# Patient Record
Sex: Female | Born: 1955
Health system: Southern US, Community
[De-identification: ages and names within clinical notes are randomized; demographics above are authoritative.]

## PROBLEM LIST (undated history)

## (undated) DIAGNOSIS — J449 Chronic obstructive pulmonary disease, unspecified: Secondary | ICD-10-CM

## (undated) DIAGNOSIS — E785 Hyperlipidemia, unspecified: Secondary | ICD-10-CM

## (undated) DIAGNOSIS — K589 Irritable bowel syndrome without diarrhea: Secondary | ICD-10-CM

## (undated) DIAGNOSIS — D509 Iron deficiency anemia, unspecified: Secondary | ICD-10-CM

## (undated) DIAGNOSIS — K219 Gastro-esophageal reflux disease without esophagitis: Secondary | ICD-10-CM

## (undated) DIAGNOSIS — I2510 Atherosclerotic heart disease of native coronary artery without angina pectoris: Secondary | ICD-10-CM

## (undated) DIAGNOSIS — E039 Hypothyroidism, unspecified: Secondary | ICD-10-CM

## (undated) DIAGNOSIS — Z9889 Other specified postprocedural states: Secondary | ICD-10-CM

## (undated) DIAGNOSIS — K449 Diaphragmatic hernia without obstruction or gangrene: Secondary | ICD-10-CM

## (undated) DIAGNOSIS — I2102 ST elevation (STEMI) myocardial infarction involving left anterior descending coronary artery: Secondary | ICD-10-CM

## (undated) DIAGNOSIS — R0600 Dyspnea, unspecified: Secondary | ICD-10-CM

## (undated) DIAGNOSIS — R112 Nausea with vomiting, unspecified: Secondary | ICD-10-CM

## (undated) DIAGNOSIS — R519 Headache, unspecified: Secondary | ICD-10-CM

## (undated) DIAGNOSIS — I10 Essential (primary) hypertension: Secondary | ICD-10-CM

## (undated) DIAGNOSIS — Z951 Presence of aortocoronary bypass graft: Secondary | ICD-10-CM

## (undated) DIAGNOSIS — I480 Paroxysmal atrial fibrillation: Secondary | ICD-10-CM

## (undated) DIAGNOSIS — J189 Pneumonia, unspecified organism: Secondary | ICD-10-CM

## (undated) DIAGNOSIS — Z720 Tobacco use: Secondary | ICD-10-CM

## (undated) HISTORY — PX: CHOLECYSTECTOMY: SHX55

## (undated) HISTORY — DX: Hyperlipidemia, unspecified: E78.5

## (undated) HISTORY — PX: OVARIAN CYST REMOVAL: SHX89

## (undated) HISTORY — DX: Chronic obstructive pulmonary disease, unspecified: J44.9

## (undated) HISTORY — PX: APPENDECTOMY: SHX54

## (undated) HISTORY — DX: Presence of aortocoronary bypass graft: Z95.1

## (undated) HISTORY — DX: ST elevation (STEMI) myocardial infarction involving left anterior descending coronary artery: I21.02

## (undated) HISTORY — PX: NM MYOCAR PERF WALL MOTION: HXRAD629

## (undated) HISTORY — DX: Paroxysmal atrial fibrillation: I48.0

## (undated) HISTORY — PX: BREAST BIOPSY: SHX20

## (undated) HISTORY — DX: Gastro-esophageal reflux disease without esophagitis: K21.9

## (undated) HISTORY — PX: BREAST EXCISIONAL BIOPSY: SUR124

## (undated) HISTORY — DX: Atherosclerotic heart disease of native coronary artery without angina pectoris: I25.10

## (undated) HISTORY — DX: Diaphragmatic hernia without obstruction or gangrene: K44.9

## (undated) HISTORY — PX: TRANSTHORACIC ECHOCARDIOGRAM: SHX275

## (undated) HISTORY — DX: Tobacco use: Z72.0

## (undated) HISTORY — DX: Hypothyroidism, unspecified: E03.9

## (undated) HISTORY — DX: Iron deficiency anemia, unspecified: D50.9

---

## 1996-01-04 DIAGNOSIS — I2510 Atherosclerotic heart disease of native coronary artery without angina pectoris: Secondary | ICD-10-CM

## 1996-01-04 HISTORY — DX: Atherosclerotic heart disease of native coronary artery without angina pectoris: I25.10

## 1996-01-19 DIAGNOSIS — I2102 ST elevation (STEMI) myocardial infarction involving left anterior descending coronary artery: Secondary | ICD-10-CM

## 1996-01-19 HISTORY — PX: CORONARY ANGIOPLASTY: SHX604

## 1996-01-19 HISTORY — DX: ST elevation (STEMI) myocardial infarction involving left anterior descending coronary artery: I21.02

## 1996-01-19 HISTORY — PX: LEFT HEART CATH AND CORONARY ANGIOGRAPHY: CATH118249

## 1996-02-26 HISTORY — PX: CORONARY ANGIOPLASTY WITH STENT PLACEMENT: SHX49

## 1996-07-03 DIAGNOSIS — Z951 Presence of aortocoronary bypass graft: Secondary | ICD-10-CM | POA: Insufficient documentation

## 1996-07-03 HISTORY — DX: Presence of aortocoronary bypass graft: Z95.1

## 1996-07-30 HISTORY — PX: LEFT HEART CATH AND CORONARY ANGIOGRAPHY: CATH118249

## 1996-07-30 HISTORY — PX: CARDIAC CATHETERIZATION: SHX172

## 1996-07-31 HISTORY — PX: CORONARY ARTERY BYPASS GRAFT: SHX141

## 1996-11-14 DIAGNOSIS — I25119 Atherosclerotic heart disease of native coronary artery with unspecified angina pectoris: Secondary | ICD-10-CM | POA: Insufficient documentation

## 1996-11-14 DIAGNOSIS — I2510 Atherosclerotic heart disease of native coronary artery without angina pectoris: Secondary | ICD-10-CM | POA: Insufficient documentation

## 1997-06-24 ENCOUNTER — Other Ambulatory Visit: Admission: RE | Admit: 1997-06-24 | Discharge: 1997-06-24 | Payer: Self-pay | Admitting: *Deleted

## 1998-03-17 ENCOUNTER — Emergency Department (HOSPITAL_COMMUNITY): Admission: EM | Admit: 1998-03-17 | Discharge: 1998-03-17 | Payer: Self-pay | Admitting: Emergency Medicine

## 1998-03-17 ENCOUNTER — Encounter: Payer: Self-pay | Admitting: Emergency Medicine

## 1998-03-25 ENCOUNTER — Ambulatory Visit (HOSPITAL_COMMUNITY): Admission: RE | Admit: 1998-03-25 | Discharge: 1998-03-26 | Payer: Self-pay

## 1998-08-21 ENCOUNTER — Other Ambulatory Visit: Admission: RE | Admit: 1998-08-21 | Discharge: 1998-08-21 | Payer: Self-pay | Admitting: *Deleted

## 2000-02-17 ENCOUNTER — Other Ambulatory Visit: Admission: RE | Admit: 2000-02-17 | Discharge: 2000-02-17 | Payer: Self-pay | Admitting: *Deleted

## 2000-07-10 ENCOUNTER — Encounter (INDEPENDENT_AMBULATORY_CARE_PROVIDER_SITE_OTHER): Payer: Self-pay | Admitting: *Deleted

## 2000-07-10 ENCOUNTER — Ambulatory Visit (HOSPITAL_COMMUNITY): Admission: RE | Admit: 2000-07-10 | Discharge: 2000-07-10 | Payer: Self-pay | Admitting: *Deleted

## 2001-05-07 ENCOUNTER — Other Ambulatory Visit: Admission: RE | Admit: 2001-05-07 | Discharge: 2001-05-07 | Payer: Self-pay | Admitting: *Deleted

## 2002-06-13 ENCOUNTER — Other Ambulatory Visit: Admission: RE | Admit: 2002-06-13 | Discharge: 2002-06-13 | Payer: Self-pay | Admitting: *Deleted

## 2003-01-31 ENCOUNTER — Ambulatory Visit (HOSPITAL_COMMUNITY): Admission: RE | Admit: 2003-01-31 | Discharge: 2003-01-31 | Payer: Self-pay | Admitting: Cardiovascular Disease

## 2003-01-31 HISTORY — PX: LEFT HEART CATH AND CORS/GRAFTS ANGIOGRAPHY: CATH118250

## 2003-01-31 HISTORY — PX: CARDIAC CATHETERIZATION: SHX172

## 2004-05-19 ENCOUNTER — Encounter: Admission: RE | Admit: 2004-05-19 | Discharge: 2004-05-19 | Payer: Self-pay | Admitting: Gastroenterology

## 2004-10-14 ENCOUNTER — Ambulatory Visit (HOSPITAL_COMMUNITY): Admission: RE | Admit: 2004-10-14 | Discharge: 2004-10-14 | Payer: Self-pay | Admitting: Gastroenterology

## 2006-06-16 ENCOUNTER — Encounter: Admission: RE | Admit: 2006-06-16 | Discharge: 2006-06-16 | Payer: Self-pay | Admitting: Orthopedic Surgery

## 2006-10-23 ENCOUNTER — Ambulatory Visit (HOSPITAL_COMMUNITY): Admission: RE | Admit: 2006-10-23 | Discharge: 2006-10-23 | Payer: Self-pay | Admitting: Orthopedic Surgery

## 2006-10-23 ENCOUNTER — Encounter (INDEPENDENT_AMBULATORY_CARE_PROVIDER_SITE_OTHER): Payer: Self-pay | Admitting: Orthopedic Surgery

## 2008-05-09 ENCOUNTER — Ambulatory Visit: Payer: Self-pay | Admitting: Internal Medicine

## 2008-05-19 LAB — CBC & DIFF AND RETIC
BASO%: 0.5 % (ref 0.0–2.0)
Basophils Absolute: 0 10*3/uL (ref 0.0–0.1)
EOS%: 4.5 % (ref 0.0–7.0)
Eosinophils Absolute: 0.3 10*3/uL (ref 0.0–0.5)
HCT: 33.8 % — ABNORMAL LOW (ref 34.8–46.6)
HGB: 11.5 g/dL — ABNORMAL LOW (ref 11.6–15.9)
IRF: 0.27 (ref 0.130–0.330)
LYMPH%: 33.3 % (ref 14.0–49.7)
MCH: 32.9 pg (ref 25.1–34.0)
MCHC: 34.1 g/dL (ref 31.5–36.0)
MCV: 96.5 fL (ref 79.5–101.0)
MONO#: 0.5 10*3/uL (ref 0.1–0.9)
MONO%: 6.7 % (ref 0.0–14.0)
NEUT#: 3.9 10*3/uL (ref 1.5–6.5)
NEUT%: 55 % (ref 38.4–76.8)
Platelets: 255 10*3/uL (ref 145–400)
RBC: 3.5 10*6/uL — ABNORMAL LOW (ref 3.70–5.45)
RDW: 14 % (ref 11.2–14.5)
RETIC #: 32.2 10*3/uL (ref 19.7–115.1)
Retic %: 0.9 % (ref 0.4–2.3)
WBC: 7.1 10*3/uL (ref 3.9–10.3)
lymph#: 2.4 10*3/uL (ref 0.9–3.3)

## 2008-05-21 LAB — IRON AND TIBC
%SAT: 26 % (ref 20–55)
Iron: 87 ug/dL (ref 42–145)
TIBC: 329 ug/dL (ref 250–470)
UIBC: 242 ug/dL

## 2008-05-21 LAB — PROTEIN ELECTROPHORESIS, SERUM
Albumin ELP: 59.1 % (ref 55.8–66.1)
Alpha-1-Globulin: 4.8 % (ref 2.9–4.9)
Alpha-2-Globulin: 13.8 % — ABNORMAL HIGH (ref 7.1–11.8)
Beta 2: 4.7 % (ref 3.2–6.5)
Beta Globulin: 6 % (ref 4.7–7.2)
Gamma Globulin: 11.6 % (ref 11.1–18.8)
Total Protein, Serum Electrophoresis: 6.8 g/dL (ref 6.0–8.3)

## 2008-05-21 LAB — COMPREHENSIVE METABOLIC PANEL
ALT: 12 U/L (ref 0–35)
AST: 14 U/L (ref 0–37)
Albumin: 4.1 g/dL (ref 3.5–5.2)
Alkaline Phosphatase: 67 U/L (ref 39–117)
BUN: 20 mg/dL (ref 6–23)
CO2: 18 mEq/L — ABNORMAL LOW (ref 19–32)
Calcium: 9.3 mg/dL (ref 8.4–10.5)
Chloride: 110 mEq/L (ref 96–112)
Creatinine, Ser: 1.43 mg/dL — ABNORMAL HIGH (ref 0.40–1.20)
Glucose, Bld: 72 mg/dL (ref 70–99)
Potassium: 4.8 mEq/L (ref 3.5–5.3)
Sodium: 136 mEq/L (ref 135–145)
Total Bilirubin: 0.3 mg/dL (ref 0.3–1.2)
Total Protein: 6.8 g/dL (ref 6.0–8.3)

## 2008-05-21 LAB — FOLATE: Folate: 6.4 ng/mL

## 2008-05-21 LAB — FERRITIN: Ferritin: 55 ng/mL (ref 10–291)

## 2008-05-21 LAB — LACTATE DEHYDROGENASE: LDH: 148 U/L (ref 94–250)

## 2008-05-21 LAB — VITAMIN B12: Vitamin B-12: 288 pg/mL (ref 211–911)

## 2008-05-27 LAB — CBC WITH DIFFERENTIAL/PLATELET
BASO%: 0.7 % (ref 0.0–2.0)
Basophils Absolute: 0.1 10*3/uL (ref 0.0–0.1)
EOS%: 3.8 % (ref 0.0–7.0)
Eosinophils Absolute: 0.3 10*3/uL (ref 0.0–0.5)
HCT: 32.3 % — ABNORMAL LOW (ref 34.8–46.6)
HGB: 11 g/dL — ABNORMAL LOW (ref 11.6–15.9)
LYMPH%: 32 % (ref 14.0–49.7)
MCH: 33.3 pg (ref 25.1–34.0)
MCHC: 34 g/dL (ref 31.5–36.0)
MCV: 97.8 fL (ref 79.5–101.0)
MONO#: 0.4 10*3/uL (ref 0.1–0.9)
MONO%: 5.1 % (ref 0.0–14.0)
NEUT#: 4.5 10*3/uL (ref 1.5–6.5)
NEUT%: 58.4 % (ref 38.4–76.8)
Platelets: 263 10*3/uL (ref 145–400)
RBC: 3.31 10*6/uL — ABNORMAL LOW (ref 3.70–5.45)
RDW: 13.7 % (ref 11.2–14.5)
WBC: 7.7 10*3/uL (ref 3.9–10.3)
lymph#: 2.5 10*3/uL (ref 0.9–3.3)

## 2008-05-27 LAB — LACTATE DEHYDROGENASE: LDH: 123 U/L (ref 94–250)

## 2008-07-15 ENCOUNTER — Encounter: Admission: RE | Admit: 2008-07-15 | Discharge: 2008-07-15 | Payer: Self-pay | Admitting: Endocrinology

## 2008-08-25 ENCOUNTER — Ambulatory Visit: Payer: Self-pay | Admitting: Internal Medicine

## 2009-01-13 ENCOUNTER — Ambulatory Visit: Payer: Self-pay | Admitting: Internal Medicine

## 2009-01-15 LAB — CBC WITH DIFFERENTIAL/PLATELET
BASO%: 0.7 % (ref 0.0–2.0)
Basophils Absolute: 0.1 10*3/uL (ref 0.0–0.1)
EOS%: 3.5 % (ref 0.0–7.0)
Eosinophils Absolute: 0.3 10*3/uL (ref 0.0–0.5)
HCT: 33.6 % — ABNORMAL LOW (ref 34.8–46.6)
HGB: 11.4 g/dL — ABNORMAL LOW (ref 11.6–15.9)
LYMPH%: 29.6 % (ref 14.0–49.7)
MCH: 33.8 pg (ref 25.1–34.0)
MCHC: 34.1 g/dL (ref 31.5–36.0)
MCV: 99.2 fL (ref 79.5–101.0)
MONO#: 0.7 10*3/uL (ref 0.1–0.9)
MONO%: 7.8 % (ref 0.0–14.0)
NEUT#: 5.1 10*3/uL (ref 1.5–6.5)
NEUT%: 58.4 % (ref 38.4–76.8)
Platelets: 254 10*3/uL (ref 145–400)
RBC: 3.38 10*6/uL — ABNORMAL LOW (ref 3.70–5.45)
RDW: 13.2 % (ref 11.2–14.5)
WBC: 8.7 10*3/uL (ref 3.9–10.3)
lymph#: 2.6 10*3/uL (ref 0.9–3.3)

## 2009-01-15 LAB — COMPREHENSIVE METABOLIC PANEL
ALT: 20 U/L (ref 0–35)
AST: 20 U/L (ref 0–37)
Albumin: 4.4 g/dL (ref 3.5–5.2)
Alkaline Phosphatase: 56 U/L (ref 39–117)
BUN: 17 mg/dL (ref 6–23)
CO2: 22 mEq/L (ref 19–32)
Calcium: 9.8 mg/dL (ref 8.4–10.5)
Chloride: 106 mEq/L (ref 96–112)
Creatinine, Ser: 1.25 mg/dL — ABNORMAL HIGH (ref 0.40–1.20)
Glucose, Bld: 96 mg/dL (ref 70–99)
Potassium: 5 mEq/L (ref 3.5–5.3)
Sodium: 139 mEq/L (ref 135–145)
Total Bilirubin: 0.4 mg/dL (ref 0.3–1.2)
Total Protein: 6.8 g/dL (ref 6.0–8.3)

## 2009-01-15 LAB — LACTATE DEHYDROGENASE: LDH: 141 U/L (ref 94–250)

## 2009-04-10 ENCOUNTER — Ambulatory Visit: Payer: Self-pay | Admitting: Internal Medicine

## 2009-06-22 ENCOUNTER — Ambulatory Visit: Payer: Self-pay | Admitting: Internal Medicine

## 2009-06-24 LAB — CBC WITH DIFFERENTIAL/PLATELET
BASO%: 0.7 % (ref 0.0–2.0)
Basophils Absolute: 0.1 10*3/uL (ref 0.0–0.1)
EOS%: 3.7 % (ref 0.0–7.0)
Eosinophils Absolute: 0.3 10*3/uL (ref 0.0–0.5)
HCT: 34.8 % (ref 34.8–46.6)
HGB: 12.1 g/dL (ref 11.6–15.9)
LYMPH%: 32.2 % (ref 14.0–49.7)
MCH: 33.3 pg (ref 25.1–34.0)
MCHC: 34.7 g/dL (ref 31.5–36.0)
MCV: 95.9 fL (ref 79.5–101.0)
MONO#: 0.4 10*3/uL (ref 0.1–0.9)
MONO%: 5.2 % (ref 0.0–14.0)
NEUT#: 4.8 10*3/uL (ref 1.5–6.5)
NEUT%: 58.2 % (ref 38.4–76.8)
Platelets: 221 10*3/uL (ref 145–400)
RBC: 3.63 10*6/uL — ABNORMAL LOW (ref 3.70–5.45)
RDW: 12.4 % (ref 11.2–14.5)
WBC: 8.2 10*3/uL (ref 3.9–10.3)
lymph#: 2.6 10*3/uL (ref 0.9–3.3)

## 2009-06-24 LAB — COMPREHENSIVE METABOLIC PANEL
ALT: 20 U/L (ref 0–35)
AST: 16 U/L (ref 0–37)
Albumin: 4.4 g/dL (ref 3.5–5.2)
Alkaline Phosphatase: 68 U/L (ref 39–117)
BUN: 24 mg/dL — ABNORMAL HIGH (ref 6–23)
CO2: 17 mEq/L — ABNORMAL LOW (ref 19–32)
Calcium: 9.7 mg/dL (ref 8.4–10.5)
Chloride: 107 mEq/L (ref 96–112)
Creatinine, Ser: 1.32 mg/dL — ABNORMAL HIGH (ref 0.40–1.20)
Glucose, Bld: 126 mg/dL — ABNORMAL HIGH (ref 70–99)
Potassium: 4.8 mEq/L (ref 3.5–5.3)
Sodium: 136 mEq/L (ref 135–145)
Total Bilirubin: 0.4 mg/dL (ref 0.3–1.2)
Total Protein: 6.9 g/dL (ref 6.0–8.3)

## 2009-06-24 LAB — LACTATE DEHYDROGENASE: LDH: 138 U/L (ref 94–250)

## 2009-09-09 ENCOUNTER — Encounter: Admission: RE | Admit: 2009-09-09 | Discharge: 2009-09-09 | Payer: Self-pay | Admitting: Endocrinology

## 2009-12-11 ENCOUNTER — Ambulatory Visit: Payer: Self-pay | Admitting: Internal Medicine

## 2010-01-22 ENCOUNTER — Ambulatory Visit: Payer: Self-pay | Admitting: Internal Medicine

## 2010-01-23 ENCOUNTER — Encounter: Payer: Self-pay | Admitting: Gastroenterology

## 2010-01-29 LAB — CBC WITH DIFFERENTIAL/PLATELET
BASO%: 0.6 % (ref 0.0–2.0)
Basophils Absolute: 0.1 10*3/uL (ref 0.0–0.1)
EOS%: 2.7 % (ref 0.0–7.0)
Eosinophils Absolute: 0.2 10*3/uL (ref 0.0–0.5)
HCT: 36.6 % (ref 34.8–46.6)
HGB: 12.6 g/dL (ref 11.6–15.9)
LYMPH%: 33.3 % (ref 14.0–49.7)
MCH: 33.4 pg (ref 25.1–34.0)
MCHC: 34.5 g/dL (ref 31.5–36.0)
MCV: 96.9 fL (ref 79.5–101.0)
MONO#: 0.6 10*3/uL (ref 0.1–0.9)
MONO%: 7.6 % (ref 0.0–14.0)
NEUT#: 4.6 10*3/uL (ref 1.5–6.5)
NEUT%: 55.8 % (ref 38.4–76.8)
Platelets: 249 10*3/uL (ref 145–400)
RBC: 3.78 10*6/uL (ref 3.70–5.45)
RDW: 12.8 % (ref 11.2–14.5)
WBC: 8.3 10*3/uL (ref 3.9–10.3)
lymph#: 2.8 10*3/uL (ref 0.9–3.3)

## 2010-01-29 LAB — IRON AND TIBC
%SAT: 24 % (ref 20–55)
Iron: 72 ug/dL (ref 42–145)
TIBC: 297 ug/dL (ref 250–470)
UIBC: 225 ug/dL

## 2010-01-29 LAB — FERRITIN: Ferritin: 170 ng/mL (ref 10–291)

## 2010-05-18 NOTE — Op Note (Signed)
NAMEPATRECIA, Dominique Barber            ACCOUNT NO.:  [id number]   MEDICAL RECORD NO.:  [id number]          PATIENT TYPE:  AMB   LOCATION:  DAY                          FACILITY:  [facility]   PHYSICIAN:  Madlyn Frankel. Charlann Boxer, M.D.  DATE OF BIRTH:  1955/04/12   DATE OF PROCEDURE:  10/23/2006  DATE OF DISCHARGE:                               OPERATIVE REPORT   PREOPERATIVE DIAGNOSIS:  Left elbow soft tissue mass.   POSTOPERATIVE DIAGNOSIS:  Left elbow soft tissue mass.   PROCEDURE:  Excision of left elbow soft tissue mass medially.   SPECIMENS:  Sent to frozen section.   SURGEON:  Madlyn Frankel. Charlann Boxer, M.D.   ASSISTANT:  Yetta Glassman. Mann, PA   ANESTHESIA:  General LMA.   FINDINGS:  There was a soft tissue mass that was soft and spongy.  It  was nonpulsatile.  It did not have the appearance of any adipose tissue.  It had a bluish hue.   TOURNIQUET TIME:  Less than 20 minutes at 200 mmHg.   DRAINS:  None.   COMPLICATIONS:  None.   INDICATIONS FOR PROCEDURE:  The patient is a 55 year old right-hand  dominant female here for evaluation of left elbow mass.  It was  beginning to be irritating to her.  She had no constitutional symptoms  of fevers, chills, or night sweats.  The mass was noted to be mobile,  soft, and it was nonpulsatile on examination.  MRI was ordered to  confirm that it was not a neurologically originated tumor.  Given the  persistence of her symptoms, she wished to have it removed, despite the  fact that it was felt to be a benign mass based on examination.  Consent  was obtained after reviewing the risks of neurologic injury, recurrence  of tumor, or findings consistent with malignancy requiring further  treatment.  Consent was obtained.   DESCRIPTION OF PROCEDURE:  The patient was brought to the operating  room.  Once adequate anesthesia, preoperative antibiotics administered,  the patient was positioned supine.  A left arm tourniquet was placed.  From the fingers to the  tourniquet, the left upper extremity was prepped  and draped in usual sterile fashion.  Longitudinal incision was made  over top of the mass.  Sharp dissection was carried through the fascia.  Hemostasis was obtained the whole time using the bipolar cautery.  Using  the dissecting scissors and a dissecting hemostat, the soft tissue mass  was dissected out of the wound.  There were some superficial veins that  were providing some blood flow to this area and hemostasis was acquired  with the bipolar.  The mass was removed in total without complicating  features.  The tourniquet was let down following irrigating the wound.  Any further hemostasis required using the bipolar with no significant  hemostasis required.  It reapproximated the wound at this point with 3-0  Vicryl and 3-0 nylon as her epidermal layer was very thin perhaps due to  the pressure from the soft tissue mass.  The elbow was then cleaned,  dried, and dressed sterilely with Xeroform dressing and a  bulky wrap.  At the time the procedure was over, the frozen section had returned and  I discussed with the pathologist it appeared to be more of a vascular  malformation type issue that appeared benign.  Final pathology results  are pending.  Further investigation slide analysis.      Madlyn Frankel Charlann Boxer, M.D.  Electronically Signed     MDO/MEDQ  D:  10/23/2006  T:  10/23/2006  Job:  [id number]

## 2010-05-21 NOTE — Cardiovascular Report (Signed)
[name], [name]                      ACCOUNT NO.:  [id number]   MEDICAL RECORD NO.:  [id number]                   PATIENT TYPE:  OIB   LOCATION:  2861                                 FACILITY:  [facility]   PHYSICIAN:  Richard A. Alanda Amass, M.D.          DATE OF BIRTH:  03-25-1955   DATE OF PROCEDURE:  01/31/2003  DATE OF DISCHARGE:                              CARDIAC CATHETERIZATION   PROCEDURE:  Retrograde central aortic catheterization, selective coronary  angiography by Judkins technique, LVH, RAO, LOA projection, saphenous vein  graft angiography, sub selective RMA, selective LIMA, abdominal aortic  angiogram.   DESCRIPTION OF PROCEDURE:  The patient was brought to the second-floor CP  laboratory in a postabsorptive state.  Because of previous history of hives  after complicated emergency PTCA, and possible dye allergy, she was given  55 of IV Solu-Medrol, 25 IV Medrol, 20 IV Pepcid postoperatively.  She was  hydrated preoperatively, and the right coronary artery was prepped and  draped in the usual manner.  Xylocaine 1% was used for local anesthesia and  the CFRA was entered with a single anterior puncture using #18 thin-walled  needle.  A 6-French short Daig sidearm sheath was inserted without  difficulty.  Diagnostic coronary angiography was done with 6-French, 4 cm  taper, preformed coronary and pigtail catheter with Omnipaque dye used  throughout the procedure.  Saphenous vein graft angiography was done with  the right coronary catheter.  Subselective RMA and selective LIMA were done  with direct coronary catheter.  LV angiogram was done in the RAO and LAO  projections, 25 cc and 14 cc per seconds, 20 cc, 12 cc per second.  Pullback  pressure CA was performed showing no gradient across the aortic valve.  Abdominal aortic angiogram was done above the level of the renal arteries,  in the mid-stream PA projection at 25 cc, 20 cc per second with  visualization to the  right SFA profunda junction bilaterally.  Catheter was  removed.  Side-arm sheath was flushed.  The patient was given 20 mg of  labetalol IV and was given 200 micrograms of IC nitroglycerin during the  procedure for hypertension.  She tolerated the procedure well and was  transferred to the holding area for postoperative care and sheath removal  with pressure hemostasis when blood pressure was adequately lowered.  She  was given 2 mg.   PRESSURES:  LV:  200/0; LVEDP 16 mmHg; A 22 mmHg.  CA:  200/95 mmHg.   FLUOROSCOPY:  Fluoroscopy did not reveal any significant coronary,  intracardiac or valvular calcifications.   CORONARY ARTERIES:  The main left coronary was normal.   The LAD was totally occluded in the proximal third right after the first  septal perforator branch.  There was a large, optional diagonal branch that  bifurcated, was widely patent and smooth through its course.   The circumflex was a dominant vessel giving off a small first marginal, a  large bifurcating second marginal, and a large third marginal from the  distal vessel.  Large PDA and bifurcating PLA branches were seen.  The  circumflex was widely patent and smooth throughout its course and appeared  angiographically normal.   The saphenous vein graft to the mid left anterior descending was widely  patent and smooth throughout its course with an excellent anastomosis to the  mid LAD.  It filled to just beyond the proximal third of the LAD.  It filled  a diagonal branch well.  It bifurcated at the apex, and there was retrograde  filling of septal perforators.  There were collateral filling of a moderate-  sized bifurcating marginal branch through the LIMA.   The saphenous vein graft to a very small DX1 was widely patent with no  stenosis.  Excellent anastomosis to a very small DX1 that bifurcated.   The LIMA to the LAD was atretic and essentially occluded in the junction of  its proximal and mid third with no  antegrade filling.  There was no  subclavian stenosis.  Normal left vertebral with good antegrade flow.   The right brachiocephalic and subclavian were normal.  The ungrafted RIMA  was widely patent.   ANGIOGRAM:  LV angiogram in the RAO and LAO projection showed hypokinesis of  the mid anterior wall and mild hypokinesis of the anteroapical segment.  There was wall motion present in these areas with a very small akinetic  apical area, but no paradoxical motion or aneurysm formation.  The posterior  wall and inferior wall contracted normally.  Estimated ejection fraction was  approximately 50-55% with no mitral regurgitation.   Abdominal aortic angiogram in the mid stream PA projection showed a normal  proximal celiac and SMA axis.  The renals were single and normal  bilaterally.  The IMA was intact.  The infrarenal abdominal aorta had very  minimal distal atherosclerotic disease, and the common and external iliacs  appeared normal.  The hypogastrics were intact.   HISTORY:  Dominique Barber is a married mother of three.  One of her daughter is still  missing a long time after being possibly abducted and presumed dead in  [place] when she was teenager.  There is still an ongoing investigation.   Diavion was a heavy smoker and suffered an AWMI on January 19, 1996,  complicated by ventricular fibrillation at age 55.  She had status  epilepticus, required intubation prior to coming to the laboratory, and had  subtotal occlusion of her LAD that was dilated with POBA with a reperfusion  time of three hours and 30 minutes, and single-vessel disease.  She  underwent re catheterization with restenosis, February 26, 1996, and 95%  proximal - mid LAD lesion was stented with a 3-0 25 multilink stent.   She underwent re catheterization in July, 1998, and was found to have  diffuse in-stent restenosis that involved the large DX2 as well.  For this reason, she was referred for elective coronary artery  bypass graft.  She  underwent coronary artery bypass graft by Dr. Barry Dienes, July 31, 1996, with  LIMA to LAD and SVG to the diagonal.  Postoperatively, she had acute  anterior ischemia with ST elevation and was taken back to the OR where she  had another vein graft placed to the mid LAD successfully.  She essentially  had functional occlusion of her LIMA at that time.   She has been treated medically long-term.  She has discontinued smoking but  has intermittently  smoked.  She has irritable bowel syndrome, thyroid  nodule, under the care of Dr. Alfonse Alpers. Gegick, and systemic hypertension  which recently has progressed.  She has had episodic chest pain, and it has  been difficulty to tell between coronary and reflux symptoms, and had a  positive Cardiolite December 30, 2002, suggesting inferior ischemia with EF  of 55%.  Catheterization was recommended in this setting.   Fortunately, she has widely patent vein graft to the LAD with no disease of  her native large diagonal and dominant circumflex and nondominant right.  Her vein graft to the LAD is excellent with no evidence of disease.  She has  a patent vein graft to a very small diagonal.  A larger diagonal is filled  retrograde through the distal LAD diagonal branches.  She had some residual  LV dysfunction, wall motion abnormality of the inferoapical segments, but EF  was greater than 50%.  There are normal renal arteries with system  hypertension.   RECOMMENDATIONS:  Recommend continued medical therapy for hypertension and  associated problems.   CATHETERIZATION DIAGNOSES:  1. Arteriosclerotic heart disease status post anterior wall myocardial     infarction treated with emergency percutaneous transluminal coronary     angioplasty (POBA), January 19, 1996.  2. Left anterior descending stenting for symptomatic restenosis, February 26, 1996.  3. Second restenosis in-stent prompting referral for elective coronary     artery  bypass graft July 31, 1996.  4. Coronary artery bypass graft times 2, LIMA to LAD, SVG to diagonal     complicated by anterior wall myocardial infarction positively, and     successful saphenous vein graft to the mid LAD on reoperation.  5. System hypertension.  Normal renal arteries.  6. Hyperlipidemia.  7. Irritable bowel syndrome under the care of Dr. Anselmo Rod.  8. Gastroesophageal reflux disease.  9. Thyroid nodule, under the care of Dr. Alfonse Alpers. Gegick.  10.      Remote cigarette abuse.  Intermittent continued smoking.                                               Richard A. Alanda Amass, M.D.    RAW/MEDQ  D:  01/31/2003  T:  02/02/2003  Job:  [id number]   cc:   Alfonse Alpers. Dagoberto Ligas, M.D.  [address]  [place]  [place] [address]  Fax: [phone]   Salvatore Decent. Cornelius Moras, M.D.  [address]  [place]  [place] [address]   Anselmo Rod, M.D.  Fax: [phone]

## 2010-07-08 ENCOUNTER — Encounter (HOSPITAL_BASED_OUTPATIENT_CLINIC_OR_DEPARTMENT_OTHER): Payer: Medicare Other | Admitting: Internal Medicine

## 2010-07-08 ENCOUNTER — Other Ambulatory Visit: Payer: Self-pay | Admitting: Internal Medicine

## 2010-07-08 DIAGNOSIS — I2510 Atherosclerotic heart disease of native coronary artery without angina pectoris: Secondary | ICD-10-CM

## 2010-07-08 DIAGNOSIS — D649 Anemia, unspecified: Secondary | ICD-10-CM

## 2010-07-08 DIAGNOSIS — I10 Essential (primary) hypertension: Secondary | ICD-10-CM

## 2010-07-08 LAB — CBC WITH DIFFERENTIAL/PLATELET
BASO%: 0.6 % (ref 0.0–2.0)
Basophils Absolute: 0 10*3/uL (ref 0.0–0.1)
EOS%: 3.3 % (ref 0.0–7.0)
Eosinophils Absolute: 0.3 10*3/uL (ref 0.0–0.5)
HCT: 33.7 % — ABNORMAL LOW (ref 34.8–46.6)
HGB: 11.8 g/dL (ref 11.6–15.9)
LYMPH%: 29.9 % (ref 14.0–49.7)
MCH: 33.5 pg (ref 25.1–34.0)
MCHC: 34.9 g/dL (ref 31.5–36.0)
MCV: 96.1 fL (ref 79.5–101.0)
MONO#: 0.6 10*3/uL (ref 0.1–0.9)
MONO%: 6.9 % (ref 0.0–14.0)
NEUT#: 4.8 10*3/uL (ref 1.5–6.5)
NEUT%: 59.3 % (ref 38.4–76.8)
Platelets: 214 10*3/uL (ref 145–400)
RBC: 3.51 10*6/uL — ABNORMAL LOW (ref 3.70–5.45)
RDW: 12.8 % (ref 11.2–14.5)
WBC: 8.1 10*3/uL (ref 3.9–10.3)
lymph#: 2.4 10*3/uL (ref 0.9–3.3)

## 2010-07-08 LAB — IRON AND TIBC
%SAT: 23 % (ref 20–55)
Iron: 68 ug/dL (ref 42–145)
TIBC: 299 ug/dL (ref 250–470)
UIBC: 231 ug/dL

## 2010-07-08 LAB — FERRITIN: Ferritin: 166 ng/mL (ref 10–291)

## 2010-07-13 ENCOUNTER — Encounter (HOSPITAL_BASED_OUTPATIENT_CLINIC_OR_DEPARTMENT_OTHER): Payer: Medicare Other | Admitting: Internal Medicine

## 2010-07-13 DIAGNOSIS — I10 Essential (primary) hypertension: Secondary | ICD-10-CM

## 2010-07-13 DIAGNOSIS — D649 Anemia, unspecified: Secondary | ICD-10-CM

## 2010-07-13 DIAGNOSIS — I2510 Atherosclerotic heart disease of native coronary artery without angina pectoris: Secondary | ICD-10-CM

## 2010-10-13 LAB — BASIC METABOLIC PANEL
BUN: 6
CO2: 23
Calcium: 9.6
Chloride: 105
Creatinine, Ser: 0.98
GFR calc Af Amer: >60
GFR calc non Af Amer: 60 — ABNORMAL LOW
Glucose, Bld: 107 — ABNORMAL HIGH
Potassium: 3.8
Sodium: 134 — ABNORMAL LOW

## 2010-10-13 LAB — HEMOGLOBIN AND HEMATOCRIT, BLOOD
HCT: 36
Hemoglobin: 12.6

## 2010-10-13 LAB — URINALYSIS, ROUTINE W REFLEX MICROSCOPIC
Bilirubin Urine: NEGATIVE
Glucose, UA: NEGATIVE
Hgb urine dipstick: NEGATIVE
Ketones, ur: NEGATIVE
Nitrite: NEGATIVE
Protein, ur: NEGATIVE
Specific Gravity, Urine: 1.02
Urobilinogen, UA: 0.2
pH: 5.5

## 2010-10-13 LAB — PREGNANCY, URINE: Preg Test, Ur: NEGATIVE

## 2010-11-17 ENCOUNTER — Other Ambulatory Visit: Payer: Self-pay | Admitting: Gastroenterology

## 2010-11-17 DIAGNOSIS — R1032 Left lower quadrant pain: Secondary | ICD-10-CM

## 2010-12-03 ENCOUNTER — Ambulatory Visit
Admission: RE | Admit: 2010-12-03 | Discharge: 2010-12-03 | Disposition: A | Discharge status: Home / Self Care | Payer: Medicare Other | Source: Ambulatory Visit | Attending: Gastroenterology | Admitting: Gastroenterology

## 2010-12-03 DIAGNOSIS — R1032 Left lower quadrant pain: Secondary | ICD-10-CM

## 2011-02-14 DIAGNOSIS — Z124 Encounter for screening for malignant neoplasm of cervix: Secondary | ICD-10-CM | POA: Diagnosis not present

## 2011-02-24 ENCOUNTER — Other Ambulatory Visit: Payer: Self-pay | Admitting: Obstetrics and Gynecology

## 2011-02-24 DIAGNOSIS — D219 Benign neoplasm of connective and other soft tissue, unspecified: Secondary | ICD-10-CM

## 2011-02-28 ENCOUNTER — Other Ambulatory Visit: Payer: Medicare Other

## 2011-03-29 ENCOUNTER — Encounter (HOSPITAL_COMMUNITY): Payer: Self-pay | Admitting: *Deleted

## 2011-03-29 ENCOUNTER — Emergency Department (HOSPITAL_COMMUNITY)
Admission: EM | Admit: 2011-03-29 | Discharge: 2011-03-29 | Disposition: A | Discharge status: Home / Self Care | Payer: Medicare Other | Attending: Emergency Medicine | Admitting: Emergency Medicine

## 2011-03-29 ENCOUNTER — Emergency Department (HOSPITAL_COMMUNITY): Payer: Medicare Other

## 2011-03-29 ENCOUNTER — Emergency Department (INDEPENDENT_AMBULATORY_CARE_PROVIDER_SITE_OTHER)
Admission: EM | Admit: 2011-03-29 | Discharge: 2011-03-29 | Disposition: A | Discharge status: Nursing Home (Includes ICF) | Payer: Medicare Other | Source: Home / Self Care | Attending: Family Medicine | Admitting: Family Medicine

## 2011-03-29 ENCOUNTER — Encounter (HOSPITAL_COMMUNITY): Payer: Self-pay | Admitting: Emergency Medicine

## 2011-03-29 DIAGNOSIS — J984 Other disorders of lung: Secondary | ICD-10-CM | POA: Diagnosis not present

## 2011-03-29 DIAGNOSIS — R1013 Epigastric pain: Secondary | ICD-10-CM | POA: Diagnosis not present

## 2011-03-29 DIAGNOSIS — I25810 Atherosclerosis of coronary artery bypass graft(s) without angina pectoris: Secondary | ICD-10-CM | POA: Diagnosis not present

## 2011-03-29 DIAGNOSIS — R109 Unspecified abdominal pain: Secondary | ICD-10-CM

## 2011-03-29 DIAGNOSIS — R6889 Other general symptoms and signs: Secondary | ICD-10-CM

## 2011-03-29 DIAGNOSIS — R197 Diarrhea, unspecified: Secondary | ICD-10-CM | POA: Diagnosis not present

## 2011-03-29 DIAGNOSIS — J111 Influenza due to unidentified influenza virus with other respiratory manifestations: Secondary | ICD-10-CM | POA: Insufficient documentation

## 2011-03-29 DIAGNOSIS — R079 Chest pain, unspecified: Secondary | ICD-10-CM | POA: Diagnosis not present

## 2011-03-29 DIAGNOSIS — J438 Other emphysema: Secondary | ICD-10-CM | POA: Insufficient documentation

## 2011-03-29 HISTORY — DX: Irritable bowel syndrome, unspecified: K58.9

## 2011-03-29 LAB — DIFFERENTIAL
Basophils Absolute: 0.1 10*3/uL (ref 0.0–0.1)
Basophils Relative: 1 % (ref 0–1)
Eosinophils Absolute: 0.1 10*3/uL (ref 0.0–0.7)
Eosinophils Relative: 2 % (ref 0–5)
Lymphocytes Relative: 24 % (ref 12–46)
Lymphs Abs: 1.3 10*3/uL (ref 0.7–4.0)
Monocytes Absolute: 0.4 10*3/uL (ref 0.1–1.0)
Monocytes Relative: 8 % (ref 3–12)
Neutro Abs: 3.6 10*3/uL (ref 1.7–7.7)
Neutrophils Relative %: 65 % (ref 43–77)

## 2011-03-29 LAB — COMPREHENSIVE METABOLIC PANEL
ALT: 18 U/L (ref 0–35)
AST: 18 U/L (ref 0–37)
Albumin: 3.8 g/dL (ref 3.5–5.2)
Alkaline Phosphatase: 72 U/L (ref 39–117)
BUN: 9 mg/dL (ref 6–23)
CO2: 22 mEq/L (ref 19–32)
Calcium: 9.5 mg/dL (ref 8.4–10.5)
Chloride: 104 mEq/L (ref 96–112)
Creatinine, Ser: 1.14 mg/dL — ABNORMAL HIGH (ref 0.50–1.10)
GFR calc Af Amer: 62 mL/min — ABNORMAL LOW (ref >90)
GFR calc non Af Amer: 53 mL/min — ABNORMAL LOW (ref >90)
Glucose, Bld: 99 mg/dL (ref 70–99)
Potassium: 3.7 mEq/L (ref 3.5–5.1)
Sodium: 138 mEq/L (ref 135–145)
Total Bilirubin: 0.2 mg/dL — ABNORMAL LOW (ref 0.3–1.2)
Total Protein: 7 g/dL (ref 6.0–8.3)

## 2011-03-29 LAB — URINALYSIS, ROUTINE W REFLEX MICROSCOPIC
Bilirubin Urine: NEGATIVE
Glucose, UA: NEGATIVE mg/dL
Hgb urine dipstick: NEGATIVE
Ketones, ur: NEGATIVE mg/dL
Nitrite: NEGATIVE
Protein, ur: NEGATIVE mg/dL
Specific Gravity, Urine: 1.014 (ref 1.005–1.030)
Urobilinogen, UA: 0.2 mg/dL (ref 0.0–1.0)
pH: 5.5 (ref 5.0–8.0)

## 2011-03-29 LAB — CBC
HCT: 34.6 % — ABNORMAL LOW (ref 36.0–46.0)
Hemoglobin: 12.2 g/dL (ref 12.0–15.0)
MCH: 33.1 pg (ref 26.0–34.0)
MCHC: 35.3 g/dL (ref 30.0–36.0)
MCV: 93.8 fL (ref 78.0–100.0)
Platelets: 169 10*3/uL (ref 150–400)
RBC: 3.69 MIL/uL — ABNORMAL LOW (ref 3.87–5.11)
RDW: 12.8 % (ref 11.5–15.5)
WBC: 5.4 10*3/uL (ref 4.0–10.5)

## 2011-03-29 LAB — LIPASE, BLOOD: Lipase: 13 U/L (ref 11–59)

## 2011-03-29 LAB — URINE MICROSCOPIC-ADD ON

## 2011-03-29 MED ORDER — SODIUM CHLORIDE 0.9 % IV BOLUS (SEPSIS): 1000.0000 mL | Freq: Once | INTRAVENOUS | Status: DC

## 2011-03-29 MED ORDER — ONDANSETRON 4 MG PO TBDP
ORAL_TABLET | ORAL | Status: AC
  Filled 2011-03-29: qty 1

## 2011-03-29 MED ORDER — IBUPROFEN 200 MG PO TABS: 600.0000 mg | ORAL_TABLET | Freq: Once | ORAL | Status: DC

## 2011-03-29 MED ORDER — SODIUM CHLORIDE 0.9 % IV BOLUS (SEPSIS)
1000.0000 mL | Freq: Once | INTRAVENOUS | Status: AC
  Administered 2011-03-29: 1000 mL via INTRAVENOUS

## 2011-03-29 NOTE — ED Notes (Signed)
Patient transported to X-ray 

## 2011-03-29 NOTE — Discharge Instructions (Signed)
Transferred to Emergency Department.

## 2011-03-29 NOTE — ED Provider Notes (Signed)
Pt sent to the ED for further evaluation of her abdominal pain and diarrhea.  Pt has been having uri symptoms, cough , congestion with  Abdominal cramping.    On exam she has no abdominal ttp at this time.  Labs and xrays are reassuring.  Do not feel that abdominal CT is necessary at this time.  Pt agrees with this plan.  Suspect viral etiology.  Will dc home on supportive meds.  Celene Kras, MD 03/29/11 2258

## 2011-03-29 NOTE — ED Notes (Signed)
PT. TRANSFERRED FROM [facility] THIS EVENING FOR FURTHER EVALUATION OF MID ABDOMINAL PAIN WITH DIARRHEA,BODY ACHES , LOW GRADE FEVER ONSET TODAY .

## 2011-03-29 NOTE — ED Provider Notes (Signed)
History     CSN: [id number]  Arrival date & time 03/29/11  1952   First MD Initiated Contact with Patient 03/29/11 2147      Chief Complaint  Patient presents with  . Abdominal Pain    (Consider location/radiation/quality/duration/timing/severity/associated sxs/prior treatment) HPI Comments: 56 yo female with hx of IBS-like symptoms. Now having 2 days of URI symptoms with myalgias. Multiple sick contacts at home with the same. No CP or dyspnea. Started having epigastric abd pain that's new from her normal IBS-like pain today. Started acutely, so went to urgent care and sent here. For past 45 minutes patient has had no pain.   Patient is a 56 y.o. female presenting with abdominal pain and URI. The history is provided by the patient.  Abdominal Pain The primary symptoms of the illness include abdominal pain and diarrhea (chronic, loose watery stools). The primary symptoms of the illness do not include fever, shortness of breath, nausea or vomiting. The current episode started 3 to 5 hours ago. The onset of the illness was sudden. The problem has been resolved.  The abdominal pain is located in the epigastric region. The abdominal pain does not radiate. The severity of the abdominal pain is 0/10. The abdominal pain is relieved by nothing. Exacerbated by: nothing.  Symptoms associated with the illness do not include chills, constipation or urgency.  URI The primary symptoms include cough, abdominal pain and myalgias. Primary symptoms do not include fever, headaches, sore throat, nausea or vomiting. The current episode started 2 days ago. This is a new problem.  The cough is non-productive.  Symptoms associated with the illness include rhinorrhea. The illness is not associated with chills or congestion.    Past Medical History  Diagnosis Date  . CAD (coronary artery disease)   . Irritable bowel syndrome     Past Surgical History  Procedure Date  . Appendectomy   . Cardiac surgery   .  Cesarean section   . Cholecystectomy     Family History  Problem Relation Age of Onset  . Heart failure Other   . Diabetes Other     History  Substance Use Topics  . Smoking status: Current Everyday Smoker  . Smokeless tobacco: Not on file  . Alcohol Use: No    OB History    Grav Para Term Preterm Abortions TAB SAB Ect Mult Living                  Review of Systems  Constitutional: Negative for fever and chills.  HENT: Positive for rhinorrhea and sneezing. Negative for congestion and sore throat.   Respiratory: Positive for cough. Negative for shortness of breath.   Cardiovascular: Negative for chest pain and leg swelling.  Gastrointestinal: Positive for abdominal pain and diarrhea (chronic, loose watery stools). Negative for nausea, vomiting and constipation.  Genitourinary: Negative for urgency, decreased urine volume and difficulty urinating.  Musculoskeletal: Positive for myalgias.  Skin: Negative for wound.  Neurological: Negative for headaches.  Psychiatric/Behavioral: Negative for confusion.  All other systems reviewed and are negative.    Allergies  Iohexol  Home Medications   Current Outpatient Rx  Name Route Sig Dispense Refill  . ASPIRIN EC 81 MG PO TBEC Oral Take 81 mg by mouth daily.    . BUPROPION HBR ER PO Oral Take 150 mg by mouth 2 (two) times daily.     Marland Kitchen CALCIUM PO Oral Take 1 tablet by mouth 2 (two) times daily.    Marland Kitchen CETIRIZINE  HCL PO Oral Take 10 mg by mouth daily.     . INTEGRA PO Oral Take 1 tablet by mouth daily.     . TUSSIN PO Oral Take 30 mLs by mouth every 4 (four) hours as needed. For cough    . HYOSCYAMINE SULFATE 0.125 MG PO TABS Oral Take 0.125 mg by mouth daily.    . IBUPROFEN 200 MG PO TABS Oral Take 200 mg by mouth every 4 (four) hours as needed. For pain.    Marland Kitchen SYNTHROID PO Oral Take 100 mcg by mouth daily.     Marland Kitchen METOPROLOL TARTRATE PO Oral Take 25 mg by mouth 2 (two) times daily.     Marland Kitchen ESTROVEN PO Oral Take 1 tablet by mouth  daily.    Marland Kitchen PANTOPRAZOLE SODIUM PO Oral Take 40 mg by mouth daily.     Marland Kitchen K-LOR PO Oral Take 750 mg by mouth 2 (two) times daily.       BP 116/79  Pulse 68  Temp(Src) 98.4 F (36.9 C) (Oral)  Resp 18  SpO2 97%  Physical Exam  Nursing note and vitals reviewed. Constitutional: She is oriented to person, place, and time. She appears well-developed and well-nourished. No distress.  HENT:  Head: Normocephalic and atraumatic.  Right Ear: External ear normal.  Left Ear: External ear normal.  Nose: Nose normal.  Mouth/Throat: Oropharynx is clear and moist.  Neck: Neck supple.  Cardiovascular: Normal rate, regular rhythm, normal heart sounds and intact distal pulses.   Pulmonary/Chest: Effort normal and breath sounds normal. No respiratory distress. She has no wheezes. She has no rales.  Abdominal: Soft. She exhibits no distension. There is no tenderness. There is no rebound and no guarding.  Musculoskeletal: She exhibits no edema.  Lymphadenopathy:    She has no cervical adenopathy.  Neurological: She is alert and oriented to person, place, and time.  Skin: Skin is warm and dry. She is not diaphoretic. No pallor.    ED Course  Procedures (including critical care time)  Labs Reviewed  URINALYSIS, ROUTINE W REFLEX MICROSCOPIC - Abnormal; Notable for the following:    APPearance CLOUDY (*)    Leukocytes, UA TRACE (*)    All other components within normal limits  CBC - Abnormal; Notable for the following:    RBC 3.69 (*)    HCT 34.6 (*)    All other components within normal limits  COMPREHENSIVE METABOLIC PANEL - Abnormal; Notable for the following:    Creatinine, Ser 1.14 (*)    Total Bilirubin 0.2 (*)    GFR calc non Af Amer 53 (*)    GFR calc Af Amer 62 (*)    All other components within normal limits  URINE MICROSCOPIC-ADD ON - Abnormal; Notable for the following:    Squamous Epithelial / LPF MANY (*)    Bacteria, UA FEW (*)    All other components within normal limits    DIFFERENTIAL  LIPASE, BLOOD   Dg Chest 2 View  03/29/2011  *RADIOLOGY REPORT*  Clinical Data: 56 year old female with cough, congestion and pain.  CHEST - 2 VIEW  Comparison: 10/20/2006 chest radiograph  Findings: Upper limits normal heart size, CABG changes and COPD/emphysema identified. Mild biapical pleuroparenchymal scarring noted. There is no evidence of focal airspace disease, pulmonary edema, suspicious pulmonary nodule/mass, pleural effusion, or pneumothorax. No acute bony abnormalities are identified.  IMPRESSION: No evidence of acute cardiopulmonary disease.  COPD/emphysema.  Original Report Authenticated By: Rosendo Gros, M.D.  1. Flu-like symptoms       MDM  56 yo female with Flu-like symptoms for over 48 hours w/o dyspnea. No fevers. Recent abd pain different from IBS pain, but now resolved. Abd exam benign, without pain in ED do not feel she needs a scan. Feels better after IVF. Doubt acute intra-abd pathology since patient is completely asymptomatic. Sx c/w viral, likely flu. Outside of window to receive tamiflu (over 48 hrs of symptoms), will do symptomatic Tx at home. Discussed return precautions with patient and husband.         Pricilla Loveless, MD 03/29/11 2257

## 2011-03-29 NOTE — ED Notes (Signed)
D/c instructions reviewed w/ pt and family - pt and family deny any further questions or concerns at present.\ 

## 2011-03-29 NOTE — ED Notes (Signed)
Pt  Reports  She  Has  Nausea   abd  Pain      With  Body  Aches  As   Well  As  A  Cough    -   She  Reports  She  Has  Chronic     Stomach problems          She  Reports  Loose  Stools        After  Eating     Today

## 2011-03-29 NOTE — ED Provider Notes (Signed)
History     CSN: [id number]  Arrival date & time 03/29/11  1744   First MD Initiated Contact with Patient 03/29/11 1802      Chief Complaint  Patient presents with  . Abdominal Pain    (Consider location/radiation/quality/duration/timing/severity/associated sxs/prior treatment) HPI Comments: Dominique Barber presents for evaluation of multiple complaints. She reports onset of flulike symptoms on Sunday with cough, body aches, low-grade fevers. She also reports decreased appetite. Today, she reports onset of severe abdominal pain that started 2 hours prior to arrival. She does report a history of "chronic stomach problems" that started after she had a cardiac bypass in 1998. She reports, that she was told that this is irritable bowel syndrome. However, her pain today is much more intense than her usual pain, status 10 out of 10 on palpation exam and 5/10 at rest. She is status post cholecystectomy and appendectomy, remotely. She has also had 3 C-sections and history of uterine fibroids. She is tolerating PO well and move her bowels normally. She is afebrile here. She denies any chest pain, but does report, shortness of breath, with her recent URI symptoms.  Patient is a 56 y.o. female presenting with abdominal pain. The history is provided by the patient.  Abdominal Pain The primary symptoms of the illness include abdominal pain, fever and shortness of breath. The primary symptoms of the illness do not include nausea, vomiting or diarrhea. The current episode started 1 to 2 hours ago. The onset of the illness was sudden. The problem has not changed since onset. The abdominal pain began 1 to 2 hours ago. The pain came on suddenly. The abdominal pain has been unchanged since its onset. The abdominal pain is generalized. The abdominal pain does not radiate.  The patient states that she believes she is currently not pregnant.    Past Medical History  Diagnosis Date  . CAD (coronary artery disease)      Past Surgical History  Procedure Date  . Appendectomy   . Cardiac surgery   . Cesarean section     Family History  Problem Relation Age of Onset  . Heart failure Other   . Diabetes Other     History  Substance Use Topics  . Smoking status: Current Everyday Smoker  . Smokeless tobacco: Not on file  . Alcohol Use: No    OB History    Grav Para Term Preterm Abortions TAB SAB Ect Mult Living                  Review of Systems  Constitutional: Positive for fever.  HENT: Positive for congestion.   Eyes: Negative.   Respiratory: Positive for cough and shortness of breath.   Cardiovascular: Negative.   Gastrointestinal: Positive for abdominal pain. Negative for nausea, vomiting and diarrhea.  Genitourinary: Negative.   Musculoskeletal: Positive for myalgias.  Skin: Negative.   Neurological: Negative.     Allergies  Iohexol  Home Medications   Current Outpatient Rx  Name Route Sig Dispense Refill  . BUPROPION HBR ER PO Oral Take by mouth.    . INTEGRA PO Oral Take by mouth.    Marland Kitchen HYOSCYAMINE PO Oral Take by mouth.    . SYNTHROID PO Oral Take by mouth.    . METOPROLOL TARTRATE PO Oral Take by mouth.    Marland Kitchen PANTOPRAZOLE SODIUM PO Oral Take by mouth.    Marland Kitchen K-LOR PO Oral Take by mouth.      BP 116/79  Pulse 78  Temp(Src)  98.8 F (37.1 C) (Oral)  Resp 18  SpO2 97%  Physical Exam  Nursing note and vitals reviewed. Constitutional: She is oriented to person, place, and time. She appears well-developed and well-nourished.  HENT:  Head: Normocephalic and atraumatic.  Mouth/Throat: Uvula is midline, oropharynx is clear and moist and mucous membranes are normal.  Eyes: EOM are normal.  Neck: Normal range of motion.  Cardiovascular: Normal rate and regular rhythm.   Murmur heard.  Systolic murmur is present with a grade of 2/6  Pulmonary/Chest: Effort normal and breath sounds normal. She has no decreased breath sounds. She has no wheezes. She has no rhonchi.   Abdominal: Soft. Normal appearance and bowel sounds are normal. She exhibits distension. There is tenderness in the right upper quadrant, right lower quadrant, epigastric area, left upper quadrant and left lower quadrant. There is guarding. There is no rebound.  Musculoskeletal: Normal range of motion.  Neurological: She is alert and oriented to person, place, and time.  Skin: Skin is warm and dry.  Psychiatric: Her behavior is normal.    ED Course  Procedures (including critical care time)  Labs Reviewed - No data to display No results found.   1. Abdominal pain       MDM  Transferred to ED for further evaluation of abdominal pain        Renaee Munda, MD 03/29/11 [phone]

## 2011-03-29 NOTE — ED Notes (Signed)
Discussed pt's d/c vital signs w/ Dr. Criss Alvine and Dr. Roselyn Bering - pt states she normally takes her metoprolol at this time and may be the reason her HR is high. Pt denies any abd pain at present, in no acute distress and does not display and symptoms of orthostatic hypotension. Pt advised to be appropriate for discharge by physician.

## 2011-03-29 NOTE — ED Notes (Signed)
Pt reports acute onset of generalized abd pain that began approx 1500 today and ceased approx 1.5hr ago. Pt denies any n/v/d or fever, admits to hx of IBS. Pt also here for cold symptoms - nasal congestion and cough. Pt denies abd at present however states has a headache, pt provided w/ a heat pack per pt request. Pt in no acute distress, family at bedside x1.

## 2011-04-15 DIAGNOSIS — Z951 Presence of aortocoronary bypass graft: Secondary | ICD-10-CM | POA: Diagnosis not present

## 2011-04-15 DIAGNOSIS — I2510 Atherosclerotic heart disease of native coronary artery without angina pectoris: Secondary | ICD-10-CM | POA: Diagnosis not present

## 2011-04-15 DIAGNOSIS — E782 Mixed hyperlipidemia: Secondary | ICD-10-CM | POA: Diagnosis not present

## 2011-04-22 DIAGNOSIS — R109 Unspecified abdominal pain: Secondary | ICD-10-CM | POA: Diagnosis not present

## 2011-04-22 DIAGNOSIS — E782 Mixed hyperlipidemia: Secondary | ICD-10-CM | POA: Diagnosis not present

## 2011-04-22 DIAGNOSIS — R5381 Other malaise: Secondary | ICD-10-CM | POA: Diagnosis not present

## 2011-04-22 DIAGNOSIS — Z79899 Other long term (current) drug therapy: Secondary | ICD-10-CM | POA: Diagnosis not present

## 2011-05-17 ENCOUNTER — Other Ambulatory Visit: Payer: Medicare Other

## 2011-06-16 ENCOUNTER — Other Ambulatory Visit: Payer: Medicare Other

## 2011-06-24 ENCOUNTER — Other Ambulatory Visit: Payer: Medicare Other

## 2011-07-21 DIAGNOSIS — Z86010 Personal history of colonic polyps: Secondary | ICD-10-CM | POA: Diagnosis not present

## 2011-07-21 DIAGNOSIS — K589 Irritable bowel syndrome without diarrhea: Secondary | ICD-10-CM | POA: Diagnosis not present

## 2011-07-21 DIAGNOSIS — K219 Gastro-esophageal reflux disease without esophagitis: Secondary | ICD-10-CM | POA: Diagnosis not present

## 2011-07-21 DIAGNOSIS — R141 Gas pain: Secondary | ICD-10-CM | POA: Diagnosis not present

## 2011-07-21 DIAGNOSIS — R143 Flatulence: Secondary | ICD-10-CM | POA: Diagnosis not present

## 2011-07-21 DIAGNOSIS — R197 Diarrhea, unspecified: Secondary | ICD-10-CM | POA: Diagnosis not present

## 2011-07-21 DIAGNOSIS — R142 Eructation: Secondary | ICD-10-CM | POA: Diagnosis not present

## 2011-09-01 DIAGNOSIS — D509 Iron deficiency anemia, unspecified: Secondary | ICD-10-CM | POA: Diagnosis not present

## 2011-09-01 DIAGNOSIS — K589 Irritable bowel syndrome without diarrhea: Secondary | ICD-10-CM | POA: Diagnosis not present

## 2011-09-01 DIAGNOSIS — R143 Flatulence: Secondary | ICD-10-CM | POA: Diagnosis not present

## 2011-09-01 DIAGNOSIS — R141 Gas pain: Secondary | ICD-10-CM | POA: Diagnosis not present

## 2011-09-01 DIAGNOSIS — Z86010 Personal history of colonic polyps: Secondary | ICD-10-CM | POA: Diagnosis not present

## 2011-11-01 DIAGNOSIS — R002 Palpitations: Secondary | ICD-10-CM | POA: Diagnosis not present

## 2011-11-01 DIAGNOSIS — I10 Essential (primary) hypertension: Secondary | ICD-10-CM | POA: Diagnosis not present

## 2011-11-01 DIAGNOSIS — E782 Mixed hyperlipidemia: Secondary | ICD-10-CM | POA: Diagnosis not present

## 2011-11-01 DIAGNOSIS — I2510 Atherosclerotic heart disease of native coronary artery without angina pectoris: Secondary | ICD-10-CM | POA: Diagnosis not present

## 2011-11-03 DIAGNOSIS — R002 Palpitations: Secondary | ICD-10-CM | POA: Diagnosis not present

## 2011-11-08 DIAGNOSIS — R6889 Other general symptoms and signs: Secondary | ICD-10-CM | POA: Diagnosis not present

## 2011-11-08 DIAGNOSIS — R5381 Other malaise: Secondary | ICD-10-CM | POA: Diagnosis not present

## 2011-11-08 DIAGNOSIS — E782 Mixed hyperlipidemia: Secondary | ICD-10-CM | POA: Diagnosis not present

## 2011-11-24 DIAGNOSIS — E041 Nontoxic single thyroid nodule: Secondary | ICD-10-CM | POA: Diagnosis not present

## 2011-11-30 DIAGNOSIS — E041 Nontoxic single thyroid nodule: Secondary | ICD-10-CM | POA: Diagnosis not present

## 2011-11-30 DIAGNOSIS — E890 Postprocedural hypothyroidism: Secondary | ICD-10-CM | POA: Diagnosis not present

## 2012-02-03 DIAGNOSIS — I2510 Atherosclerotic heart disease of native coronary artery without angina pectoris: Secondary | ICD-10-CM | POA: Diagnosis not present

## 2012-02-03 DIAGNOSIS — E782 Mixed hyperlipidemia: Secondary | ICD-10-CM | POA: Diagnosis not present

## 2012-02-03 DIAGNOSIS — E039 Hypothyroidism, unspecified: Secondary | ICD-10-CM | POA: Diagnosis not present

## 2012-02-03 DIAGNOSIS — I25810 Atherosclerosis of coronary artery bypass graft(s) without angina pectoris: Secondary | ICD-10-CM | POA: Diagnosis not present

## 2012-02-08 ENCOUNTER — Other Ambulatory Visit (HOSPITAL_COMMUNITY): Payer: Self-pay | Admitting: Cardiovascular Disease

## 2012-02-08 DIAGNOSIS — I2510 Atherosclerotic heart disease of native coronary artery without angina pectoris: Secondary | ICD-10-CM

## 2012-02-08 DIAGNOSIS — I472 Ventricular tachycardia: Secondary | ICD-10-CM

## 2012-02-09 DIAGNOSIS — R197 Diarrhea, unspecified: Secondary | ICD-10-CM | POA: Diagnosis not present

## 2012-02-09 DIAGNOSIS — K219 Gastro-esophageal reflux disease without esophagitis: Secondary | ICD-10-CM | POA: Diagnosis not present

## 2012-02-09 DIAGNOSIS — K589 Irritable bowel syndrome without diarrhea: Secondary | ICD-10-CM | POA: Diagnosis not present

## 2012-02-09 DIAGNOSIS — R141 Gas pain: Secondary | ICD-10-CM | POA: Diagnosis not present

## 2012-02-14 DIAGNOSIS — R0602 Shortness of breath: Secondary | ICD-10-CM | POA: Diagnosis not present

## 2012-02-14 DIAGNOSIS — R6889 Other general symptoms and signs: Secondary | ICD-10-CM | POA: Diagnosis not present

## 2012-02-16 ENCOUNTER — Ambulatory Visit (HOSPITAL_COMMUNITY): Payer: Medicare Other

## 2012-02-28 ENCOUNTER — Ambulatory Visit (HOSPITAL_COMMUNITY)
Admission: RE | Admit: 2012-02-28 | Discharge: 2012-02-28 | Disposition: A | Discharge status: Home / Self Care | Payer: Medicare Other | Source: Ambulatory Visit | Attending: Cardiovascular Disease | Admitting: Cardiovascular Disease

## 2012-02-28 DIAGNOSIS — I252 Old myocardial infarction: Secondary | ICD-10-CM | POA: Insufficient documentation

## 2012-02-28 DIAGNOSIS — I472 Ventricular tachycardia: Secondary | ICD-10-CM

## 2012-02-28 DIAGNOSIS — I079 Rheumatic tricuspid valve disease, unspecified: Secondary | ICD-10-CM | POA: Diagnosis not present

## 2012-02-28 DIAGNOSIS — I2510 Atherosclerotic heart disease of native coronary artery without angina pectoris: Secondary | ICD-10-CM

## 2012-02-28 DIAGNOSIS — I059 Rheumatic mitral valve disease, unspecified: Secondary | ICD-10-CM | POA: Insufficient documentation

## 2012-02-28 NOTE — Progress Notes (Signed)
2D Echo Performed 02/28/2012    Jayni Prescher, RCS  

## 2012-05-24 DIAGNOSIS — Z124 Encounter for screening for malignant neoplasm of cervix: Secondary | ICD-10-CM | POA: Diagnosis not present

## 2012-08-06 ENCOUNTER — Other Ambulatory Visit: Payer: Self-pay | Admitting: *Deleted

## 2012-08-06 DIAGNOSIS — J449 Chronic obstructive pulmonary disease, unspecified: Secondary | ICD-10-CM | POA: Diagnosis not present

## 2012-08-06 DIAGNOSIS — E782 Mixed hyperlipidemia: Secondary | ICD-10-CM

## 2012-08-06 DIAGNOSIS — I2510 Atherosclerotic heart disease of native coronary artery without angina pectoris: Secondary | ICD-10-CM | POA: Diagnosis not present

## 2012-08-06 DIAGNOSIS — M79673 Pain in unspecified foot: Secondary | ICD-10-CM

## 2012-08-06 DIAGNOSIS — R5381 Other malaise: Secondary | ICD-10-CM

## 2012-08-06 DIAGNOSIS — E039 Hypothyroidism, unspecified: Secondary | ICD-10-CM | POA: Diagnosis not present

## 2012-08-06 DIAGNOSIS — R6889 Other general symptoms and signs: Secondary | ICD-10-CM

## 2012-08-06 DIAGNOSIS — R5383 Other fatigue: Secondary | ICD-10-CM

## 2012-08-08 ENCOUNTER — Encounter: Payer: Self-pay | Admitting: Cardiovascular Disease

## 2012-08-13 ENCOUNTER — Other Ambulatory Visit: Payer: Self-pay | Admitting: *Deleted

## 2012-08-13 DIAGNOSIS — R6889 Other general symptoms and signs: Secondary | ICD-10-CM | POA: Diagnosis not present

## 2012-08-13 DIAGNOSIS — R5381 Other malaise: Secondary | ICD-10-CM

## 2012-08-13 DIAGNOSIS — R5383 Other fatigue: Secondary | ICD-10-CM

## 2012-08-13 DIAGNOSIS — E782 Mixed hyperlipidemia: Secondary | ICD-10-CM

## 2012-08-13 LAB — COMPREHENSIVE METABOLIC PANEL
ALT: 19 U/L (ref 0–35)
AST: 16 U/L (ref 0–37)
Albumin: 4.2 g/dL (ref 3.5–5.2)
Alkaline Phosphatase: 66 U/L (ref 39–117)
BUN: 11 mg/dL (ref 6–23)
CO2: 24 mEq/L (ref 19–32)
Calcium: 9.7 mg/dL (ref 8.4–10.5)
Chloride: 106 mEq/L (ref 96–112)
Creat: 1.24 mg/dL — ABNORMAL HIGH (ref 0.50–1.10)
Glucose, Bld: 125 mg/dL — ABNORMAL HIGH (ref 70–99)
Potassium: 4.4 mEq/L (ref 3.5–5.3)
Sodium: 138 mEq/L (ref 135–145)
Total Bilirubin: 0.4 mg/dL (ref 0.3–1.2)
Total Protein: 6.6 g/dL (ref 6.0–8.3)

## 2012-08-13 LAB — CBC WITH DIFFERENTIAL/PLATELET
Basophils Absolute: 0 10*3/uL (ref 0.0–0.1)
Basophils Relative: 1 % (ref 0–1)
Eosinophils Absolute: 0.2 10*3/uL (ref 0.0–0.7)
Eosinophils Relative: 3 % (ref 0–5)
HCT: 37.5 % (ref 36.0–46.0)
Hemoglobin: 12.9 g/dL (ref 12.0–15.0)
Lymphocytes Relative: 27 % (ref 12–46)
Lymphs Abs: 2 10*3/uL (ref 0.7–4.0)
MCH: 32.3 pg (ref 26.0–34.0)
MCHC: 34.4 g/dL (ref 30.0–36.0)
MCV: 94 fL (ref 78.0–100.0)
Monocytes Absolute: 0.6 10*3/uL (ref 0.1–1.0)
Monocytes Relative: 8 % (ref 3–12)
Neutro Abs: 4.6 10*3/uL (ref 1.7–7.7)
Neutrophils Relative %: 61 % (ref 43–77)
Platelets: 258 10*3/uL (ref 150–400)
RBC: 3.99 MIL/uL (ref 3.87–5.11)
RDW: 13.2 % (ref 11.5–15.5)
WBC: 7.5 10*3/uL (ref 4.0–10.5)

## 2012-08-13 LAB — LIPID PANEL
Cholesterol: 140 mg/dL (ref 0–200)
HDL: 52 mg/dL (ref >39)
LDL Cholesterol: 68 mg/dL (ref 0–99)
Total CHOL/HDL Ratio: 2.7 Ratio
Triglycerides: 102 mg/dL (ref <150)
VLDL: 20 mg/dL (ref 0–40)

## 2012-08-13 LAB — T4, FREE: Free T4: 1.47 ng/dL (ref 0.80–1.80)

## 2012-08-13 LAB — TSH: TSH: 1.525 u[IU]/mL (ref 0.350–4.500)

## 2012-08-14 DIAGNOSIS — R143 Flatulence: Secondary | ICD-10-CM | POA: Diagnosis not present

## 2012-08-14 DIAGNOSIS — D509 Iron deficiency anemia, unspecified: Secondary | ICD-10-CM | POA: Diagnosis not present

## 2012-08-14 DIAGNOSIS — R197 Diarrhea, unspecified: Secondary | ICD-10-CM | POA: Diagnosis not present

## 2012-08-14 DIAGNOSIS — K589 Irritable bowel syndrome without diarrhea: Secondary | ICD-10-CM | POA: Diagnosis not present

## 2012-08-14 DIAGNOSIS — R141 Gas pain: Secondary | ICD-10-CM | POA: Diagnosis not present

## 2012-08-14 DIAGNOSIS — K219 Gastro-esophageal reflux disease without esophagitis: Secondary | ICD-10-CM | POA: Diagnosis not present

## 2012-08-30 DIAGNOSIS — R197 Diarrhea, unspecified: Secondary | ICD-10-CM | POA: Diagnosis not present

## 2012-08-30 DIAGNOSIS — K589 Irritable bowel syndrome without diarrhea: Secondary | ICD-10-CM | POA: Diagnosis not present

## 2012-10-02 DIAGNOSIS — R197 Diarrhea, unspecified: Secondary | ICD-10-CM | POA: Diagnosis not present

## 2012-10-02 DIAGNOSIS — K219 Gastro-esophageal reflux disease without esophagitis: Secondary | ICD-10-CM | POA: Diagnosis not present

## 2012-10-02 DIAGNOSIS — A071 Giardiasis [lambliasis]: Secondary | ICD-10-CM | POA: Diagnosis not present

## 2012-10-02 DIAGNOSIS — K589 Irritable bowel syndrome without diarrhea: Secondary | ICD-10-CM | POA: Diagnosis not present

## 2012-10-22 ENCOUNTER — Telehealth: Payer: Self-pay | Admitting: Cardiovascular Disease

## 2012-10-22 NOTE — Telephone Encounter (Signed)
Pt. Called ; pt. Had a question about who her cardiologist was going to be

## 2012-10-22 NOTE — Telephone Encounter (Signed)
Please have J C to call her after 3 please.Says she needs to talk to him.

## 2012-10-22 NOTE — Telephone Encounter (Signed)
Message forwarded to J.C. Wildman, LPN.  

## 2012-10-24 ENCOUNTER — Other Ambulatory Visit: Payer: Self-pay | Admitting: *Deleted

## 2012-10-24 MED ORDER — POTASSIUM CHLORIDE CRYS ER 10 MEQ PO TBCR: 10.0000 meq | EXTENDED_RELEASE_TABLET | Freq: Two times a day (BID) | ORAL | Status: DC

## 2012-10-30 DIAGNOSIS — R197 Diarrhea, unspecified: Secondary | ICD-10-CM | POA: Diagnosis not present

## 2012-10-30 DIAGNOSIS — K589 Irritable bowel syndrome without diarrhea: Secondary | ICD-10-CM | POA: Diagnosis not present

## 2012-10-30 DIAGNOSIS — A071 Giardiasis [lambliasis]: Secondary | ICD-10-CM | POA: Diagnosis not present

## 2012-11-01 DIAGNOSIS — R197 Diarrhea, unspecified: Secondary | ICD-10-CM | POA: Diagnosis not present

## 2012-11-09 ENCOUNTER — Other Ambulatory Visit: Payer: Self-pay | Admitting: Endocrinology

## 2012-11-16 ENCOUNTER — Telehealth: Payer: Self-pay | Admitting: Cardiovascular Disease

## 2012-11-16 NOTE — Telephone Encounter (Signed)
Message forwarded to JC to advise  

## 2012-11-16 NOTE — Telephone Encounter (Signed)
Message forwarded to J.C. Wildman, LPN.  

## 2012-11-16 NOTE — Telephone Encounter (Signed)
Please have J C to call her-need to ask him a question.

## 2012-11-16 NOTE — Telephone Encounter (Signed)
Pt. Wanted to know what her other options as far as cardiologist

## 2012-12-03 ENCOUNTER — Other Ambulatory Visit: Payer: Self-pay | Admitting: *Deleted

## 2012-12-03 ENCOUNTER — Ambulatory Visit (INDEPENDENT_AMBULATORY_CARE_PROVIDER_SITE_OTHER): Payer: Medicare Other | Admitting: Endocrinology

## 2012-12-03 ENCOUNTER — Other Ambulatory Visit: Payer: Medicare Other

## 2012-12-03 ENCOUNTER — Ambulatory Visit: Payer: Medicare Other | Admitting: Endocrinology

## 2012-12-03 ENCOUNTER — Encounter: Payer: Self-pay | Admitting: Endocrinology

## 2012-12-03 ENCOUNTER — Other Ambulatory Visit (INDEPENDENT_AMBULATORY_CARE_PROVIDER_SITE_OTHER): Payer: Medicare Other

## 2012-12-03 VITALS — BP 110/80 | HR 63 | Temp 97.9°F | Resp 10 | Ht 61.5 in | Wt 120.1 lb

## 2012-12-03 DIAGNOSIS — N289 Disorder of kidney and ureter, unspecified: Secondary | ICD-10-CM | POA: Diagnosis not present

## 2012-12-03 DIAGNOSIS — E039 Hypothyroidism, unspecified: Secondary | ICD-10-CM | POA: Insufficient documentation

## 2012-12-03 DIAGNOSIS — R7301 Impaired fasting glucose: Secondary | ICD-10-CM

## 2012-12-03 DIAGNOSIS — E890 Postprocedural hypothyroidism: Secondary | ICD-10-CM

## 2012-12-03 DIAGNOSIS — E042 Nontoxic multinodular goiter: Secondary | ICD-10-CM

## 2012-12-03 NOTE — Progress Notes (Signed)
Patient ID: Dominique Barber, female   DOB: 1955-04-24, 57 y.o.   MRN: [id number]  Reason for Appointment:  Hypothyroidism, followup visit    History of Present Illness:   The hypothyroidism was first diagnosed in 1999 after radioactive iodine treatment for toxic nodular goiter Subsequently she became hypothyroid and has been on thyroid supplementation She has generally been on stable doses Previous records are not available, was seen about a year ago   Patient has no complaints of unusual fatigue, cold sensitivity, dry skin, unusual weight gain or hair loss.            The patient is taking the thyroid supplement very regularly in the morning before breakfast.  Not taking any calcium or iron supplements with the thyroid supplement.    On the last visit the dose was 100ug  TSH in 8/14 was 1.5  GOITER: She says she has had a multinodular goiter since her 40s. Details of this are also not available at present. Does not feel any local pressure or choking sensation  LABS:  Appointment on 12/03/2012  Component Date Value Range Status  . TSH 12/03/2012 2.26  0.35 - 5.50 uIU/mL Final  . Free T4 12/03/2012 1.18  0.60 - 1.60 ng/dL Final  . Sodium 40/98/1191 139  135 - 145 mEq/L Final  . Potassium 12/03/2012 3.9  3.5 - 5.1 mEq/L Final  . Chloride 12/03/2012 106  96 - 112 mEq/L Final  . CO2 12/03/2012 27  19 - 32 mEq/L Final  . Glucose, Bld 12/03/2012 82  70 - 99 mg/dL Final  . BUN 47/82/9562 21  6 - 23 mg/dL Final  . Creatinine, Ser 12/03/2012 1.3* 0.4 - 1.2 mg/dL Final  . Calcium 13/08/6576 9.6  8.4 - 10.5 mg/dL Final  . GFR 46/96/2952 43.27* >60.00 mL/min Final      Medication List       This list is accurate as of: 12/03/12 11:59 PM.  Always use your most recent med list.               aspirin EC 81 MG tablet  Take 81 mg by mouth daily.     BUPROPION HBR ER PO  Take 150 mg by mouth 2 (two) times daily.     CALCIUM PO  Take 1 tablet by mouth 2 (two) times daily.     CETIRIZINE HCL PO  Take 10 mg by mouth daily.     ZYRTEC ALLERGY 10 MG tablet  Generic drug:  cetirizine  TAKE 1 TABLET DAILY     ESTROVEN PO  Take 1 tablet by mouth daily.     hyoscyamine 0.125 MG tablet  Commonly known as:  LEVSIN, ANASPAZ  Take 0.125 mg by mouth daily.     ibuprofen 200 MG tablet  Commonly known as:  ADVIL,MOTRIN  Take 200 mg by mouth every 4 (four) hours as needed. For pain.     INTEGRA PO  Take 1 tablet by mouth daily.     METOPROLOL TARTRATE PO  Take 25 mg by mouth 2 (two) times daily.     PANTOPRAZOLE SODIUM PO  Take 40 mg by mouth daily.     potassium chloride 10 MEQ tablet  Commonly known as:  K-DUR,KLOR-CON  Take 1 tablet (10 mEq total) by mouth 2 (two) times daily.     SYNTHROID PO  Take 100 mcg by mouth daily.     TUSSIN PO  Take 30 mLs by mouth every 4 (four) hours as  needed. For cough        Allergies:  Allergies  Allergen Reactions  . Iohexol Hives and Rash    Red dye    Past Medical History  Diagnosis Date  . CAD (coronary artery disease)   . Irritable bowel syndrome     Past Surgical History  Procedure Laterality Date  . Appendectomy    . Cardiac surgery    . Cesarean section    . Cholecystectomy      Family History  Problem Relation Age of Onset  . Heart failure Other   . Diabetes Other   . Diabetes Mother   . Thyroid disease Paternal Aunt   . Thyroid disease Maternal Grandmother     Social History:  reports that she has been smoking.  She does not have any smokeless tobacco history on file. She reports that she does not drink alcohol. Her drug history is not on file.  REVIEW Of SYSTEMS:  No history of diabetes but her glucose in August was 125, possibly fasting Has had high normal creatinine level  Not clear why she is on potassium supplements, has been seen by a cardiologist regularly  Asking about left posterior neck pain without radiation. Taking ibuprofen. This has been occurring on and off.  She  has not established with a PCP for general care   Examination:   BP 110/80  Pulse 63  Temp(Src) 97.9 F (36.6 C)  Resp 10  Ht 5' 1.5" (1.562 m)  Wt 120 lb 1.6 oz (54.477 kg)  BMI 22.33 kg/m2  SpO2 99%  GENERAL APPEARANCE: Alert, no puffiness of the face or eyes  NECK: Thyroid is palpable, has 2 nodules on the right about 2 cm and the left side has a smooth fleshy  Nodule about 3 cm in size felt best on swallowing Spine shows no cervical muscle spasm  NEUROLOGIC EXAM:  biceps reflexes show normal relaxation Skin: Not unusual dry No ankle edema    Assessment   Hypothyroidism, post ablative and long-standing Will need to repeat her TSH today  Multinodular goiter: Relatively small and asymptomatic. This has been long-standing and will need to review her previous records for comparison   ? Diabetes: She had a glucose of 125 her last visit, we'll repeat   Treatment:    Pending based on thyroid levels and BMP  Given options for PCP within our practice  [facility] 12/04/2012, 9:09 PM   Addendum: Thyroid levels are normal and glucose also normal, will followup in one year Mild increase in creatinine, will need to reduce ibuprofen and followup with PCP

## 2012-12-04 LAB — BASIC METABOLIC PANEL
BUN: 21 mg/dL (ref 6–23)
CO2: 27 mEq/L (ref 19–32)
Calcium: 9.6 mg/dL (ref 8.4–10.5)
Chloride: 106 mEq/L (ref 96–112)
Creatinine, Ser: 1.3 mg/dL — ABNORMAL HIGH (ref 0.4–1.2)
GFR: 43.27 mL/min — ABNORMAL LOW (ref >60.00)
Glucose, Bld: 82 mg/dL (ref 70–99)
Potassium: 3.9 mEq/L (ref 3.5–5.1)
Sodium: 139 mEq/L (ref 135–145)

## 2012-12-04 LAB — T4, FREE: Free T4: 1.18 ng/dL (ref 0.60–1.60)

## 2012-12-04 LAB — TSH: TSH: 2.26 u[IU]/mL (ref 0.35–5.50)

## 2012-12-04 NOTE — Progress Notes (Signed)
Quick Note:  Please let patient know that the thyroid result is normal and to continue same dose Kidney test is mildly abnormal, should not take ibuprofen. Needs to followup with PCP Blood sugar was okay ______

## 2013-01-05 DIAGNOSIS — Z23 Encounter for immunization: Secondary | ICD-10-CM | POA: Diagnosis not present

## 2013-01-14 ENCOUNTER — Ambulatory Visit: Payer: Self-pay | Admitting: Podiatry

## 2013-01-16 ENCOUNTER — Telehealth: Payer: Self-pay | Admitting: *Deleted

## 2013-01-16 ENCOUNTER — Other Ambulatory Visit: Payer: Self-pay | Admitting: Endocrinology

## 2013-01-16 NOTE — Telephone Encounter (Signed)
I have suggested that she followup with PCP first after stopping ibuprofen and he can decide on any referral

## 2013-01-16 NOTE — Telephone Encounter (Signed)
Patient says you wanted her to see a Kidney specialist, she states she needs a referral in order to see one.

## 2013-01-23 ENCOUNTER — Ambulatory Visit: Payer: Self-pay | Admitting: Podiatry

## 2013-02-11 ENCOUNTER — Ambulatory Visit: Payer: Self-pay | Admitting: Podiatry

## 2013-02-14 DIAGNOSIS — K589 Irritable bowel syndrome without diarrhea: Secondary | ICD-10-CM | POA: Diagnosis not present

## 2013-02-14 DIAGNOSIS — K219 Gastro-esophageal reflux disease without esophagitis: Secondary | ICD-10-CM | POA: Diagnosis not present

## 2013-02-14 DIAGNOSIS — D509 Iron deficiency anemia, unspecified: Secondary | ICD-10-CM | POA: Diagnosis not present

## 2013-02-14 DIAGNOSIS — R197 Diarrhea, unspecified: Secondary | ICD-10-CM | POA: Diagnosis not present

## 2013-03-18 ENCOUNTER — Ambulatory Visit: Payer: Medicare Other | Admitting: Cardiology

## 2013-05-02 ENCOUNTER — Other Ambulatory Visit: Payer: Self-pay | Admitting: Endocrinology

## 2013-05-08 ENCOUNTER — Encounter: Payer: Self-pay | Admitting: *Deleted

## 2013-05-09 ENCOUNTER — Ambulatory Visit: Payer: Medicare Other | Admitting: Cardiology

## 2013-05-13 ENCOUNTER — Encounter: Payer: Self-pay | Admitting: Cardiology

## 2013-05-13 ENCOUNTER — Ambulatory Visit (INDEPENDENT_AMBULATORY_CARE_PROVIDER_SITE_OTHER): Payer: Medicare Other | Admitting: Cardiology

## 2013-05-13 VITALS — BP 122/78 | HR 53 | Ht 62.0 in | Wt 118.1 lb

## 2013-05-13 DIAGNOSIS — E785 Hyperlipidemia, unspecified: Secondary | ICD-10-CM | POA: Diagnosis not present

## 2013-05-13 DIAGNOSIS — I2109 ST elevation (STEMI) myocardial infarction involving other coronary artery of anterior wall: Secondary | ICD-10-CM

## 2013-05-13 DIAGNOSIS — Z951 Presence of aortocoronary bypass graft: Secondary | ICD-10-CM

## 2013-05-13 DIAGNOSIS — I2102 ST elevation (STEMI) myocardial infarction involving left anterior descending coronary artery: Secondary | ICD-10-CM

## 2013-05-13 DIAGNOSIS — I2510 Atherosclerotic heart disease of native coronary artery without angina pectoris: Secondary | ICD-10-CM

## 2013-05-13 DIAGNOSIS — F17200 Nicotine dependence, unspecified, uncomplicated: Secondary | ICD-10-CM

## 2013-05-13 DIAGNOSIS — Z720 Tobacco use: Secondary | ICD-10-CM

## 2013-05-13 NOTE — Patient Instructions (Signed)
Your physician recommends that you schedule a follow-up appointment in: 6 Months  

## 2013-05-14 ENCOUNTER — Encounter: Payer: Self-pay | Admitting: Cardiology

## 2013-05-14 DIAGNOSIS — Z720 Tobacco use: Secondary | ICD-10-CM | POA: Insufficient documentation

## 2013-05-14 DIAGNOSIS — E785 Hyperlipidemia, unspecified: Secondary | ICD-10-CM | POA: Insufficient documentation

## 2013-05-14 NOTE — Assessment & Plan Note (Signed)
Due for Myoview followup in the fall 2016

## 2013-05-14 NOTE — Assessment & Plan Note (Signed)
Currently on statin. Last checked in August 2014 with very satisfactory results, currently at goal. This is being followed by her PCP

## 2013-05-14 NOTE — Progress Notes (Signed)
PATIENT: Dominique Barber MRN: [id number] DOB: June 09, 1955 PCP: Elayne Snare, MD  Clinic Note: Chief Complaint  Patient presents with  . Follow-up    6 month follow up, RW-DH, dizzy spells on occasion,     HPI: Dominique Barber is a 58 y.o. female with a long-standing history of CAD beginning with a significant anterior STEMI, compensated by VF arrest and anoxic injury with status epilepticus. She was easily treated with blow-by followed by BMS stenting roughly one month later. Fortunately the stent was unsuccessful in 5 months later had significant ISR. She was then sent for CABG with LIMA to LAD and SVG to D1. Unfortunately this was complicated by an acute anterior MI post CABG with mechanical complications of the LIMA graft. She was taken emergently back for SVG-LAD.  She had a false positive stress test in December 2004 with a cast in January 2005 did not show any significant disease. Both grafts are patent as was the native circumflex and nondominant right. The grafted diagonal was very small in caliber. Ever since her surgery, she has been troubled with significant irritable bowel syndrome and reflux that really limits her activity levels.  Interval History: Dominique Barber presents for establishing new cardiology care at the retirement of Dr. Rollene Fare who was the interventional cardiologist involved with her early years starting in 1998. He was last seen by him in August of 2014. She presents with out any real significant cardiac symptoms. He rarely will have some tingling. There is no rhinorrhea his into it. She denies any significant exertional dyspnea,  but really does not do much to exert herself. She is quite active around the house doing chores, but does not get routine exercise. Mostly what she notes is a left-sided pain in her neck and goes down her left arm. This is not similar to the symptoms she had at the time of her MI which is so sharp heavy pain in her chest that really did not  last very long before she remembers because she passed out. She denies any significant prolonged palpitations but does have intermittent skipped beats off and on relatively well controlled on metoprolol. No significant lightheadedness, dizziness, wooziness, or sicca be/near syncope, TIA/amaurosis fugax. No PND, orthopnea or edema. No melena, hematochezia , hematuria or epistaxis. No claudication.  Past Medical History  Diagnosis Date  . ST elevation (STEMI) myocardial infarction involving left anterior descending coronary artery  01/19/1996    Calculated by VF arrest, and anoxic brain injury with status epilepticus; POBA of LAD  . CAD in native artery 1998    Followup 1 month post MI revealed 95% ISR in PTCA site --> PCI with 3.0 mm x 25 mm BMS stent in proximal LAD --> 5 months later recurrent ISR of LAD BMS that compromised D1 --> referred for CABG  . S/P CABG x 04 July 1996    Initially LIMA-LAD, SVG-D1 --> immediate LIMA failure post CABG with anterior MI --> urgent redo SVG-LAD beyond D2.; Widely patent grafts as of 2005 with left dominant system. Graft to the diagonal branch has a very small target vessel but the vein graft to LAD retrograde fills a large bifurcating D2 and antegrade fills a large D3.  Marland Kitchen Hyperlipidemia   . COPD (chronic obstructive pulmonary disease)   . Tobacco abuse   . Irritable bowel syndrome     Followed by Dr. Juanita Craver.  Marland Kitchen GERD (gastroesophageal reflux disease)   . Hypothyroidism     On Synthroid  .  Iron deficiency anemia    Prior Cardiac Evaluation and Past Surgical History: Past Surgical History  Procedure Laterality Date  . Appendectomy    . Cesarean section    . Cholecystectomy    . Coronary angioplasty  01/19/1996    acute anterior wall MI, anoxic encephalopahty, VF; LCfx free of disease and dominant, RCA patent, L main short and patent, LAD with 70-80% narrowing and focal 95% stenosis in distal 3rd with balloon angioplasty (Dr. Marella Chimes)  .  Coronary angioplasty with stent placement  02/26/1996    3.0x35mm Multi-Link stent to prox/mid LAD; L main normal & short, Cfx dominant and normal, PDA & PLA normal, RCA non-dominant with small PLA and normal (Dr. Marella Chimes)  . Cardiac catheterization  07/30/1996    normal L main, LAD w/95% stenosis just before stent, diffuse 80-85% end-stent restenosis, 1st diagonal with70-75% ostial stenosis, large optional diagonal that was normal, Cfx with 2 marginals and distal PLA (all normal), RCA normal (Dr. Marella Chimes)  . Cardiac catheterization  01/31/2003    LAD totally occluded in prox 3rd, patent Cfx, SVG to mid LAd patent, SVG to small DX1 patent, LIMA to LAD was atretic and essentially occluded in junction of prox & mid 3rd, R barciocephalic and subclavian were normal (Dr. Marella Chimes)  . Coronary artery bypass graft  07/31/1996    x2; LIMA to LAD and SVG to 1st diagonal (Dr. Remus Loffler)  . Coronary artery bypass graft  07/31/1996    x1; SVG to distal LAD (Dr. Remus Loffler)  . Nm myocar perf wall motion  10/2010    LexiScan Cardiolite - mild perfusion defect r/t infarct/scar with mild periifarct ischemia in mid anterior & apical anterior region; EF 67%; abnormal, low risk study  . Transthoracic echocardiogram  02/2012    EF 50-55%, mild hypokinesis of anterospetal myocardium; mild MR    Allergies  Allergen Reactions  . Iohexol Hives and Rash    Red dye    Current Outpatient Prescriptions  Medication Sig Dispense Refill  . aspirin EC 81 MG tablet Take 81 mg by mouth daily.      Marland Kitchen atorvastatin (LIPITOR) 10 MG tablet Take 10 mg by mouth daily.      . BUPROPION HBR ER PO Take 150 mg by mouth 2 (two) times daily.       Marland Kitchen CALCIUM PO Take 1 tablet by mouth 2 (two) times daily.      Marland Kitchen CETIRIZINE HCL PO Take 10 mg by mouth daily.       . Fe Fum-FePoly-Vit C-Vit B3 (INTEGRA PO) Take 1 tablet by mouth daily.       . GuaiFENesin (TUSSIN PO) Take 30 mLs by mouth every 4 (four) hours as needed. For cough        . hyoscyamine (LEVSIN, ANASPAZ) 0.125 MG tablet Take 0.125 mg by mouth daily.      Marland Kitchen METOPROLOL TARTRATE PO Take 25 mg by mouth 2 (two) times daily.       . Nutritional Supplements (ESTROVEN PO) Take 1 tablet by mouth daily.      Marland Kitchen PANTOPRAZOLE SODIUM PO Take 40 mg by mouth daily.       . potassium chloride (K-DUR,KLOR-CON) 10 MEQ tablet Take 1 tablet (10 mEq total) by mouth 2 (two) times daily.  90 tablet  3  . Probiotic Product (ALIGN PO) Take by mouth daily.      Marland Kitchen SYNTHROID 100 MCG tablet TAKE 1 TABLET EVERY MORNING ON AN EMPTY  STOMACH  90 tablet  1  . ZYRTEC ALLERGY 10 MG tablet TAKE 1 TABLET DAILY  90 tablet  2   No current facility-administered medications for this visit.    History   Social History Narrative   Married, mother of 3 children (2 living sons age 52-26 and 30-31; her daughter was the victim of a murder that took place sometime around the time of the patient's MI. She was under significant amount of stress as her daughter had gone missing. She was never found. Finally the suspect was charged and convicted in 2009. She had sessile he stop smoking until that time frame when it brought back memories and she is now back to smoking a half pack a day.   She does not get routine exercise, but is active around the house and doing housecleaning chores.   She does not drink alcohol.    Family History: Diabetes in her mother and other; Heart failure in her other; Thyroid disease in her maternal grandmother and paternal aunt.  ROS: A comprehensive Review of Systems - Negative except Symptoms as above and chronic significant irritable bowel syndrome symptoms of diarrhea and constipation along with her GERD symptoms. She gets extremely stressed if she has any symptom in her chest. The main symptom she has her IBS is abdominal pain. intermittent foot pain from plantar warts.  PHYSICAL EXAM BP 122/78  Pulse 53  Ht 5\' 2"  (1.575 m)  Wt 118 lb 1.6 oz (53.57 kg)  BMI 21.60  kg/m2 General appearance: alert, cooperative, appears stated age, no distress, mildly obese and Anxious appearing, but answers questions properly. Somewhat subdued affect Neck: no adenopathy, no carotid bruit, no JVD and supple, symmetrical, trachea midline Lungs: clear to auscultation bilaterally, normal percussion bilaterally and Nonlabored, good air movement with mild interstitial sounds Heart: RRR with normal S1 and S2. Nondisplaced PMI. Berry early peaking 1-2/6 SEM at [place]. No other M./R./G. Abdomen: soft, non-tender; bowel sounds normal; no masses,  no organomegaly Extremities: extremities normal, atraumatic, no cyanosis or edema Pulses: 2+ and symmetric Skin: Skin color, texture, turgor normal. No rashes or lesions or Well-healed sternotomy scar and venous graft section sites. Neurologic: Alert and oriented X 3, normal strength and tone. Normal symmetric reflexes. Normal coordination and gait CN II-12 grossly intact   Adult ECG Report  Rate: 69 ;  Rhythm: normal sinus rhythm; normal axis, intervals and voltage.  Narrative Interpretation: Normal EKG  Recent Labs: Last recorded lipids from August 2014:  DC 140, TG 102, HDL 52, LDL 68 --> at goal  ASSESSMENT / PLAN: Pleasant middle-aged woman with a long-standing history of CAD. She seemed to be quite stable. She's had a negative Myoview in October 2012 and a relatively normal echocardiogram in February 2014 with an EF of 50-55% and anteroseptal hypokinesis. She is relatively asymptomatic from a cardiac standpoint.  ST elevation (STEMI) myocardial infarction involving left anterior descending coronary artery Very calculated history beginning with a prolonged MI with VF arrest and anoxic brain injury. She then had recurrent anginal symptoms for PTCA restenosis as well as in-stent restenosis. She had yet again another anterior MI when her LIMA graft shutdown.  Despite all these events, her EF is still relatively preserved with only  minimal anterior hypokinesis and no large scar on Myoview. She has no heart failure symptoms to speak of, and has not had any significant anginal pain in years.  CAD in native artery Relatively normal cath and evaluation since CABG. Fact that her native  LAD is occluded should help her 58 year old vein graft to the LAD remain patent. It's possible that the vein graft to a small diagonal branch to be occluded however that would probably not cause any significant problem.   She is on aspirin, statin and beta blocker. No active anginal symptoms or heart failure. Unfortunately, she took up smoking again after the condition of her daughter's murderer.  Despite counseling, she has not shown signs that she is willing to quit. Her last Myoview in 2012. She would probably be due for one next fall   S/P CABG x 2 Due for Myoview followup in the fall 2016  Hyperlipidemia Currently on statin. Last checked in August 2014 with very satisfactory results, currently at goal. This is being followed by her PCP  Tobacco abuse At this time I did not spend too much time talking to the importance of smoking cessation. Time I indicated that this would be a main topic of conversation once again to know more about her overall history. Dr. Rollene Fare at counseled her on many occasions, to no avail.    Orders Placed This Encounter  Procedures  . EKG 12-Lead   No orders of the defined types were placed in this encounter.    Followup: 6 months  DAVID W. Ellyn Hack, M.D., M.S. Interventional Cardiolgy [facility]

## 2013-05-14 NOTE — Assessment & Plan Note (Signed)
At this time I did not spend too much time talking to the importance of smoking cessation. Time I indicated that this would be a main topic of conversation once again to know more about her overall history. Dr. Rollene Fare at counseled her on many occasions, to no avail.
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
## 2013-05-14 NOTE — Assessment & Plan Note (Signed)
Very calculated history beginning with a prolonged MI with VF arrest and anoxic brain injury. She then had recurrent anginal symptoms for PTCA restenosis as well as in-stent restenosis. She had yet again another anterior MI when her LIMA graft shutdown.  Despite all these events, her EF is still relatively preserved with only minimal anterior hypokinesis and no large scar on Myoview. She has no heart failure symptoms to speak of, and has not had any significant anginal pain in years.

## 2013-05-23 DIAGNOSIS — R197 Diarrhea, unspecified: Secondary | ICD-10-CM | POA: Diagnosis not present

## 2013-06-21 ENCOUNTER — Telehealth: Payer: Self-pay | Admitting: Cardiology

## 2013-06-21 MED ORDER — METOPROLOL TARTRATE 25 MG PO TABS: 25.0000 mg | ORAL_TABLET | Freq: Two times a day (BID) | ORAL | Status: DC

## 2013-06-21 NOTE — Telephone Encounter (Signed)
Is needing a new prescription for Metoprolol Tartrate 25mg   sent to  [facility] . Please call  If you have any questions .Marland Kitchen  Thanks

## 2013-06-21 NOTE — Telephone Encounter (Signed)
Returned call to patient. Patient out of metoprolol tartrate. Needs 1 month supply to [facility] and [facility].  Rx was sent to pharmacy electronically.

## 2013-08-05 ENCOUNTER — Other Ambulatory Visit: Payer: Self-pay | Admitting: Endocrinology

## 2013-09-23 ENCOUNTER — Other Ambulatory Visit: Payer: Self-pay | Admitting: Endocrinology

## 2013-10-10 ENCOUNTER — Telehealth: Payer: Self-pay | Admitting: Cardiology

## 2013-10-10 NOTE — Telephone Encounter (Signed)
Closed encounter °

## 2013-11-12 ENCOUNTER — Ambulatory Visit (INDEPENDENT_AMBULATORY_CARE_PROVIDER_SITE_OTHER): Payer: Medicare Other | Admitting: Cardiology

## 2013-11-12 ENCOUNTER — Encounter: Payer: Self-pay | Admitting: Cardiology

## 2013-11-12 VITALS — BP 138/72 | HR 67 | Ht 61.0 in | Wt 115.2 lb

## 2013-11-12 DIAGNOSIS — I2510 Atherosclerotic heart disease of native coronary artery without angina pectoris: Secondary | ICD-10-CM | POA: Diagnosis not present

## 2013-11-12 DIAGNOSIS — Z720 Tobacco use: Secondary | ICD-10-CM | POA: Diagnosis not present

## 2013-11-12 DIAGNOSIS — E785 Hyperlipidemia, unspecified: Secondary | ICD-10-CM | POA: Diagnosis not present

## 2013-11-12 DIAGNOSIS — Z951 Presence of aortocoronary bypass graft: Secondary | ICD-10-CM | POA: Diagnosis not present

## 2013-11-12 MED ORDER — POTASSIUM CHLORIDE CRYS ER 10 MEQ PO TBCR: 10.0000 meq | EXTENDED_RELEASE_TABLET | Freq: Two times a day (BID) | ORAL | Status: DC

## 2013-11-12 MED ORDER — METOPROLOL TARTRATE 25 MG PO TABS: 25.0000 mg | ORAL_TABLET | Freq: Two times a day (BID) | ORAL | Status: DC

## 2013-11-12 NOTE — Patient Instructions (Signed)
Continue with current medications  Your physician wants you to follow-up in 6 months Dr Ellyn Hack.  You will receive a reminder letter in the mail two months in advance. If you don't receive a letter, please call our office to schedule the follow-up appointment.

## 2013-11-14 DIAGNOSIS — Z1211 Encounter for screening for malignant neoplasm of colon: Secondary | ICD-10-CM | POA: Diagnosis not present

## 2013-11-14 DIAGNOSIS — Z86010 Personal history of colonic polyps: Secondary | ICD-10-CM | POA: Diagnosis not present

## 2013-11-14 DIAGNOSIS — K580 Irritable bowel syndrome with diarrhea: Secondary | ICD-10-CM | POA: Diagnosis not present

## 2013-11-14 DIAGNOSIS — K219 Gastro-esophageal reflux disease without esophagitis: Secondary | ICD-10-CM | POA: Diagnosis not present

## 2013-11-14 NOTE — Progress Notes (Signed)
PCP: Elayne Snare, MD  Clinic Note: Chief Complaint  Patient presents with  . 6 MONTH VISITS    CHEST PAIN-bloating and can nto burp develop chest discomfort , no sob , heart fluttter,no edema   HPI: Dominique Barber is a 58 y.o. female with a PMH below who presents today for six-month followup of CAD with history of CABG. She suffered an anterior study back in 1998. With this she had a decompensation with VF arrest and mild anoxic brain injury. She was finally treated with a angioplasty of the LAD at that time. Shortly thereafter she underwent CABG with the LIMA-LAD and SVG-D1. - She then had a Complication when the LIMA graft and suffered another anterior MI and underwent emergent SVG-LAD bypass. Her last cardiac catheterization was in 2005 with no significant change in disease with patent grafts..  Past Medical History  Diagnosis Date  . ST elevation (STEMI) myocardial infarction involving left anterior descending coronary artery  01/19/1996    Calculated by VF arrest, and anoxic brain injury with status epilepticus; POBA of LAD  . CAD in native artery 1998    Followup 1 month post MI revealed 95% ISR in PTCA site --> PCI with 3.0 mm x 25 mm BMS stent in proximal LAD --> 5 months later recurrent ISR of LAD BMS that compromised D1 --> referred for CABG  . S/P CABG x 04 July 1996    Initially LIMA-LAD, SVG-D1 --> immediate LIMA failure post CABG with anterior MI --> urgent redo SVG-LAD beyond D2.; Widely patent grafts as of 2005 with left dominant system. Graft to the diagonal branch has a very small target vessel but the vein graft to LAD retrograde fills a large bifurcating D2 and antegrade fills a large D3.  Marland Kitchen Hyperlipidemia   . COPD (chronic obstructive pulmonary disease)   . Tobacco abuse   . Irritable bowel syndrome     Followed by Dr. Juanita Craver.  Marland Kitchen GERD (gastroesophageal reflux disease)   . Hypothyroidism     On Synthroid  . Iron deficiency anemia     Prior Cardiac  Evaluation and Past Surgical History: Past Surgical History  Procedure Laterality Date  . Appendectomy    . Cesarean section    . Cholecystectomy    . Coronary angioplasty  01/19/1996    acute anterior wall MI, anoxic encephalopahty, VF; LCfx free of disease and dominant, RCA patent, L main short and patent, LAD with 70-80% narrowing and focal 95% stenosis in distal 3rd with balloon angioplasty (Dr. Marella Chimes)  . Coronary angioplasty with stent placement  02/26/1996    3.0x58mm Multi-Link stent to prox/mid LAD; L main normal & short, Cfx dominant and normal, PDA & PLA normal, RCA non-dominant with small PLA and normal (Dr. Marella Chimes)  . Cardiac catheterization  07/30/1996    normal L main, LAD w/95% stenosis just before stent, diffuse 80-85% end-stent restenosis, 1st diagonal with70-75% ostial stenosis, large optional diagonal that was normal, Cfx with 2 marginals and distal PLA (all normal), RCA normal (Dr. Marella Chimes)  . Cardiac catheterization  01/31/2003    LAD totally occluded in prox 3rd, patent Cfx, SVG to mid LAd patent, SVG to small DX1 patent, LIMA to LAD was atretic and essentially occluded in junction of prox & mid 3rd, R barciocephalic and subclavian were normal (Dr. Marella Chimes)  . Coronary artery bypass graft  07/31/1996    x2; LIMA to LAD and SVG to 1st diagonal (Dr. Remus Loffler)  . Coronary  artery bypass graft  07/31/1996    x1; SVG to distal LAD (Dr. Remus Loffler)  . Nm myocar perf wall motion  10/2010    LexiScan Cardiolite - mild perfusion defect r/t infarct/scar with mild periifarct ischemia in mid anterior & apical anterior region; EF 67%; abnormal, low risk study  . Transthoracic echocardiogram  02/2012    EF 50-55%, mild hypokinesis of anterospetal myocardium; mild MR    Interval History: today we'll be without any cardiac symptoms. She has always had a strange sensation in her chest/upper gastric region related to her irritable bowel syndrome that she developed shortly after  her second bypass surgery. She has intermittent episodes of diarrhea and then when she has her mild constipation symptoms, it but she notes mostly is extreme bloating in the epigastric region where she feels as though she can't burp and can't move her bowels.  She feels with this by taking oral cholestyramine liquid which helps alleviate the symptoms. It is not at all similar symptoms to her MI related angina. She has not had any MI related angina with rest or exertion. She may get a bit short of breath if she tries to walk, but she really doesn't get too far away from the house because of her GI issues. She does yard work and a lot of walking around the house but not too far from the house. She does note intermittent heart states, but no rapid or irregular heartbeats. No lightheadedness or dizziness, syncope/near syncope, TIA/amaurosis fugax. She denies any heart failure symptoms of PE, orthopnea or edema. She really seems to be relatively stable overall from a cardiac standpoint. The skip beats seem to be much better controlled with her metoprolol dose.  ROS: A comprehensive was performed. Review of Systems  Constitutional: Negative.   HENT: Negative for nosebleeds.   Respiratory: Negative for cough, shortness of breath and wheezing.   Cardiovascular: Negative for claudication.  Gastrointestinal: Positive for heartburn, nausea, abdominal pain, diarrhea and constipation. Negative for vomiting, blood in stool and melena.       Gastric bloating with symptoms of possible gastroparesis, constipation and intermittent diarrhea from irritable bowel syndrome  Musculoskeletal: Positive for joint pain. Negative for myalgias and falls.  Neurological: Negative for dizziness, sensory change, speech change, focal weakness, seizures and loss of consciousness.  Endo/Heme/Allergies: Does not bruise/bleed easily.  Psychiatric/Behavioral: The patient is nervous/anxious.   All other systems reviewed and are  negative.   Current Outpatient Prescriptions on File Prior to Visit  Medication Sig Dispense Refill  . aspirin EC 81 MG tablet Take 81 mg by mouth daily.    . BUPROPION HBR ER PO Take 150 mg by mouth 2 (two) times daily.     Marland Kitchen CALCIUM PO Take 1 tablet by mouth 2 (two) times daily.    Marland Kitchen CETIRIZINE HCL PO Take 10 mg by mouth daily.     . Fe Fum-FePoly-Vit C-Vit B3 (INTEGRA PO) Take 1 tablet by mouth daily.     . GuaiFENesin (TUSSIN PO) Take 30 mLs by mouth every 4 (four) hours as needed. For cough    . hyoscyamine (LEVSIN, ANASPAZ) 0.125 MG tablet Take 0.125 mg by mouth daily.    . Nutritional Supplements (ESTROVEN PO) Take 1 tablet by mouth daily.    . Probiotic Product (ALIGN PO) Take by mouth daily.    Marland Kitchen SYNTHROID 100 MCG tablet TAKE 1 TABLET EVERY MORNING ON AN EMPTY STOMACH 90 tablet 0  . ZYRTEC ALLERGY 10 MG tablet TAKE  1 TABLET DAILY 90 tablet 1  . atorvastatin (LIPITOR) 10 MG tablet Take 10 mg by mouth daily.     No current facility-administered medications on file prior to visit.   ALLERGIES REVIEWED IN EPIC -- No change SOCIAL AND FAMILY HISTORY REVIEWED IN EPIC -- No change  Wt Readings from Last 3 Encounters:  11/12/13 115 lb 3.2 oz (52.254 kg)  05/13/13 118 lb 1.6 oz (53.57 kg)  12/03/12 120 lb 1.6 oz (54.477 kg)    PHYSICAL EXAM BP 138/72 mmHg  Pulse 67  Ht 5\' 1"  (1.549 m)  Wt 115 lb 3.2 oz (52.254 kg)  BMI 21.78 kg/m2 General appearance: alert, cooperative, appears stated age, no distress, mildly obese and Anxious appearing, but answers questions properly. Somewhat subdued affect Neck: no adenopathy, no carotid bruit, no JVD and supple, symmetrical, trachea midline; Small palpable nodule noted in the left thyroid gland. Lungs: clear to auscultation bilaterally, normal percussion bilaterally and Nonlabored, good air movement with mild interstitial sounds Heart: RRR with normal S1 and S2. Nondisplaced PMI. Berry early peaking 1-2/6 SEM at [place]. No other  M./R./G. Abdomen: soft, non-tender; bowel sounds normal; no masses, no organomegaly Extremities: extremities normal, atraumatic, no cyanosis or edema Pulses: 2+ and symmetric Skin: Skin color, texture, turgor normal. No rashes or lesions or Well-healed sternotomy scar and venous graft section sites. Neurologic: Alert and oriented X 3, normal strength and tone. Normal symmetric reflexes. Normal coordination and gait CN II-12 grossly intact   Adult ECG Report  Rate: 67 ;  Rhythm: normal sinus rhythm, possible left atrial enlargement, poor R-wave progression across the precordial leads, suggestive of possible anterior infarct, age undetermined. Mild T-wave inversions in V1 and V2.  Narrative Interpretation: stable EKG  Recent Labs:     None since August 2014. She is due for followup labs by PCP.  ASSESSMENT / PLAN: Overall stable middle-aged woman now 17 years status post very complicated MI-CABG-CABG.  No significant recurrence of symptoms.  Negative October 2012 with billeted normal echocardiogram February 2014. He is intraseptal hypokinesis but normal EF.  CAD- 100% LAD, s/p SVG-LAD after failed LIMA-LAD Relatively stable from a symptom standpoint. She is now 17 years status post CABG with vein graft to a diagonal that was small and occluded. She hasn't been back to the LAD with occluded LAD. He is on Aspirin, beta blocker and statin. Trying to cut down on her smoking, but not willing to quit. She will be due for a followup Myoview probably next fall.  S/P CABG x 2 Initial CABG was complicated by a failed LIMA-LAD. The original vein graft to the small diagonal branch did not last, but the urgent SVG-LAD seems to be holding up quite well. Followup Myoview 12.16.  Tobacco abuse She did not seem open to discussion of smoking cessation. We'll continue to try to address in future visits.    Orders Placed This Encounter  Procedures  . EKG 12-Lead   Refilled medications Meds ordered  this encounter  Medications  . metoprolol tartrate (LOPRESSOR) 25 MG tablet    Sig: Take 1 tablet (25 mg total) by mouth 2 (two) times daily.    Dispense:  180 tablet    Refill:  3  . potassium chloride (K-DUR,KLOR-CON) 10 MEQ tablet    Sig: Take 1 tablet (10 mEq total) by mouth 2 (two) times daily.    Dispense:  90 tablet    Refill:  3     Followup: 6 months   HARDING,DAVID W,  M.D., M.S. Interventional Cardiologist   Pager # [phone]

## 2013-11-14 NOTE — Assessment & Plan Note (Signed)
Relatively stable from a symptom standpoint. She is now 17 years status post CABG with vein graft to a diagonal that was small and occluded. She hasn't been back to the LAD with occluded LAD. He is on Aspirin, beta blocker and statin. Trying to cut down on her smoking, but not willing to quit. She will be due for a followup Myoview probably next fall.

## 2013-11-14 NOTE — Assessment & Plan Note (Signed)
Initial CABG was complicated by a failed LIMA-LAD. The original vein graft to the small diagonal branch did not last, but the urgent SVG-LAD seems to be holding up quite well. Followup Myoview 12.16.

## 2013-11-14 NOTE — Assessment & Plan Note (Signed)
Stable results as of last year. Due for labs rechecked by PCP. Is on a statin and tolerating it relatively well.

## 2013-11-14 NOTE — Assessment & Plan Note (Signed)
She did not seem open to discussion of smoking cessation. We'll continue to try to address in future visits.

## 2013-12-04 ENCOUNTER — Other Ambulatory Visit: Payer: Self-pay | Admitting: Endocrinology

## 2013-12-04 ENCOUNTER — Encounter: Payer: Self-pay | Admitting: Endocrinology

## 2013-12-04 ENCOUNTER — Telehealth: Payer: Self-pay | Admitting: *Deleted

## 2013-12-04 ENCOUNTER — Ambulatory Visit (INDEPENDENT_AMBULATORY_CARE_PROVIDER_SITE_OTHER): Payer: Medicare Other | Admitting: Endocrinology

## 2013-12-04 VITALS — BP 110/72 | HR 64 | Temp 98.6°F | Resp 14 | Ht 61.0 in | Wt 112.8 lb

## 2013-12-04 DIAGNOSIS — I2510 Atherosclerotic heart disease of native coronary artery without angina pectoris: Secondary | ICD-10-CM

## 2013-12-04 DIAGNOSIS — E042 Nontoxic multinodular goiter: Secondary | ICD-10-CM

## 2013-12-04 DIAGNOSIS — Z23 Encounter for immunization: Secondary | ICD-10-CM

## 2013-12-04 DIAGNOSIS — E785 Hyperlipidemia, unspecified: Secondary | ICD-10-CM | POA: Diagnosis not present

## 2013-12-04 DIAGNOSIS — E876 Hypokalemia: Secondary | ICD-10-CM

## 2013-12-04 DIAGNOSIS — E890 Postprocedural hypothyroidism: Secondary | ICD-10-CM

## 2013-12-04 NOTE — Progress Notes (Signed)
Patient ID: Dominique Barber, female   DOB: 1955-12-13, 58 y.o.   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            The patient is taking the thyroid supplement very consistently in the morning before breakfast.  Not taking any calcium or iron supplements or her cholestyramine with the thyroid supplement.    On the last visit the dose was continued at 100ug  TSH levels as below  Lab Results  Component Value Date   FREET4 1.18 12/03/2012   FREET4 1.47 08/13/2012   TSH 2.26 12/03/2012   TSH 1.525 08/13/2012    GOITER: She apparently has had a multinodular goiter since her 22s. Details of this are also not available at present. However she thinks she has had a biopsy in the past also Does not feel any local pressure or choking sensation Her thyroid is more prominent on the left side      Medication List       This list is accurate as of: 12/04/13  4:18 PM.  Always use your most recent med list.               ALIGN PO  Take by mouth daily.     aspirin EC 81 MG tablet  Take 81 mg by mouth daily.     BUPROPION HBR ER PO  Take 150 mg by mouth 2 (two) times daily.     CALCIUM PO  Take 1 tablet by mouth 2 (two) times daily.     CETIRIZINE HCL PO  Take 10 mg by mouth daily.     hyoscyamine 0.125 MG tablet  Commonly known as:  LEVSIN, ANASPAZ  Take 0.125 mg by mouth 2 (two) times daily.     INTEGRA PO  Take 1 tablet by mouth daily.     metoprolol tartrate 25 MG tablet  Commonly known as:  LOPRESSOR   Take 1 tablet (25 mg total) by mouth 2 (two) times daily.     potassium chloride 10 MEQ tablet  Commonly known as:  K-DUR,KLOR-CON  Take 1 tablet (10 mEq total) by mouth 2 (two) times daily.     SYNTHROID 100 MCG tablet  Generic drug:  levothyroxine  TAKE 1 TABLET EVERY MORNING ON AN EMPTY STOMACH     TUSSIN PO  Take 30 mLs by mouth every 4 (four) hours as needed. For cough        Allergies:  Allergies  Allergen Reactions  . Iohexol Hives and Rash    Red dye    Past Medical History  Diagnosis Date  . ST elevation (STEMI) myocardial infarction involving left anterior descending coronary artery  01/19/1996    Calculated by VF arrest, and anoxic brain injury with status epilepticus; POBA of LAD  . CAD in native artery 1998    Followup 1 month post MI revealed 95% ISR in PTCA site --> PCI with 3.0 mm x 25 mm BMS stent in proximal LAD --> 5 months later recurrent ISR of LAD BMS that compromised D1 -->  referred for CABG  . S/P CABG x 04 July 1996    Initially LIMA-LAD, SVG-D1 --> immediate LIMA failure post CABG with anterior MI --> urgent redo SVG-LAD beyond D2.; Widely patent grafts as of 2005 with left dominant system. Graft to the diagonal branch has a very small target vessel but the vein graft to LAD retrograde fills a large bifurcating D2 and antegrade fills a large D3.  Marland Kitchen Hyperlipidemia   . COPD (chronic obstructive pulmonary disease)   . Tobacco abuse   . Irritable bowel syndrome     Followed by Dr. Juanita Craver.  Marland Kitchen GERD (gastroesophageal reflux disease)   . Hypothyroidism     On Synthroid  . Iron deficiency anemia     Past Surgical History  Procedure Laterality Date  . Appendectomy    . Cesarean section    . Cholecystectomy    . Coronary angioplasty  01/19/1996    acute anterior wall MI, anoxic encephalopahty, VF; LCfx free of disease and dominant, RCA patent, L main short and patent, LAD with 70-80% narrowing and focal 95% stenosis in distal 3rd with balloon  angioplasty (Dr. Marella Chimes)  . Coronary angioplasty with stent placement  02/26/1996    3.0x56mm Multi-Link stent to prox/mid LAD; L main normal & short, Cfx dominant and normal, PDA & PLA normal, RCA non-dominant with small PLA and normal (Dr. Marella Chimes)  . Cardiac catheterization  07/30/1996    normal L main, LAD w/95% stenosis just before stent, diffuse 80-85% end-stent restenosis, 1st diagonal with70-75% ostial stenosis, large optional diagonal that was normal, Cfx with 2 marginals and distal PLA (all normal), RCA normal (Dr. Marella Chimes)  . Cardiac catheterization  01/31/2003    LAD totally occluded in prox 3rd, patent Cfx, SVG to mid LAd patent, SVG to small DX1 patent, LIMA to LAD was atretic and essentially occluded in junction of prox & mid 3rd, R barciocephalic and subclavian were normal (Dr. Marella Chimes)  . Coronary artery bypass graft  07/31/1996    x2; LIMA to LAD and SVG to 1st diagonal (Dr. Remus Loffler)  . Coronary artery bypass graft  07/31/1996    x1; SVG to distal LAD (Dr. Remus Loffler)  . Nm myocar perf wall motion  10/2010    LexiScan Cardiolite - mild perfusion defect r/t infarct/scar with mild periifarct ischemia in mid anterior & apical anterior region; EF 67%; abnormal, low risk study  . Transthoracic echocardiogram  02/2012    EF 50-55%, mild hypokinesis of anterospetal myocardium; mild MR    Family History  Problem Relation Age of Onset  . Heart failure Other   . Diabetes Other   . Diabetes Mother   . Thyroid disease Paternal Aunt   . Thyroid disease Maternal Grandmother     Social History:  reports that she has been smoking Cigarettes.  She has been smoking about 0.50 packs per day. She does not have any smokeless tobacco history on file. She reports that she does not drink alcohol. Her drug history is not on file.  REVIEW Of SYSTEMS:  No history of diabetes   Has had high normal creatinine level  Not clear why she is on potassium supplements, has been prescribed  by cardiologist.  She takes only one tablet today  She has not established with a PCP for general care despite reminders   Examination:   BP 110/72 mmHg  Pulse 64  Temp(Src) 98.6 F (37 C)  Resp 14  Ht 5\' 1"  (1.549 m)  Wt 112 lb 12.8 oz (51.166 kg)  BMI 21.32 kg/m2  SpO2 98%  She looks well  NECK: Thyroid exam shows right lobe is about twice normal with nodularity at upper and lower poles about 2 cm maximum in size. Left side is firm and rounded and feels about 3 cm in size.  Better felt on swallowing. No lymphadenopathy in the neck NEUROLOGIC EXAM:  biceps reflexes show normal relaxation No  edema    Assessment   Hypothyroidism, post ablative and usually stable Will need to repeat her TSH today to decide on her dosage  Multinodular goiter: This has been long-standing and asymptomatic.  Her exam appears to be unchanged from last year  History of hypokalemia: This is not clearly documented and will need to repeat her potassium level today since she has not had any monitoring done by other physicians  History of hypercholesterolemia: She is asking for lipid levels to be checked   Treatment:    Pending based on lab results If TSH is normal she will follow-up in another year Will try to get her old records   [facility] 12/04/2013, 4:18 PM

## 2013-12-04 NOTE — Telephone Encounter (Signed)
Hey, this patient wanted to know if we could add a Lipid panel on to her labs for her cardiologist, please advise.

## 2013-12-05 LAB — LIPID PANEL
Cholesterol: 202 mg/dL — ABNORMAL HIGH (ref 0–200)
HDL: 49.9 mg/dL (ref >39.00)
LDL Cholesterol: 132 mg/dL — ABNORMAL HIGH (ref 0–99)
NonHDL: 152.1
Total CHOL/HDL Ratio: 4
Triglycerides: 101 mg/dL (ref 0.0–149.0)
VLDL: 20.2 mg/dL (ref 0.0–40.0)

## 2013-12-05 LAB — BASIC METABOLIC PANEL
BUN: 16 mg/dL (ref 6–23)
CO2: 20 mEq/L (ref 19–32)
Calcium: 9.2 mg/dL (ref 8.4–10.5)
Chloride: 108 mEq/L (ref 96–112)
Creatinine, Ser: 1.2 mg/dL (ref 0.4–1.2)
GFR: 48.97 mL/min — ABNORMAL LOW (ref >60.00)
Glucose, Bld: 95 mg/dL (ref 70–99)
Potassium: 3.8 mEq/L (ref 3.5–5.1)
Sodium: 138 mEq/L (ref 135–145)

## 2013-12-05 LAB — T4, FREE: Free T4: 1.12 ng/dL (ref 0.60–1.60)

## 2013-12-05 LAB — TSH: TSH: 3.06 u[IU]/mL (ref 0.35–4.50)

## 2013-12-06 NOTE — Progress Notes (Signed)
Quick Note:  Please let patient know that the thyroid lab result is normal and no change in dosage needed Cholesterol is much higher than last year, needs to discuss treatment with her previously prescribing doctor Potassium is okay ______

## 2013-12-27 ENCOUNTER — Other Ambulatory Visit: Payer: Self-pay | Admitting: Endocrinology

## 2013-12-30 ENCOUNTER — Other Ambulatory Visit: Payer: Self-pay | Admitting: *Deleted

## 2013-12-30 MED ORDER — CETIRIZINE HCL 10 MG PO TABS: 10.0000 mg | ORAL_TABLET | Freq: Every day | ORAL | Status: DC

## 2014-02-13 ENCOUNTER — Other Ambulatory Visit: Payer: Self-pay | Admitting: Endocrinology

## 2014-03-21 ENCOUNTER — Encounter: Payer: Self-pay | Admitting: *Deleted

## 2014-03-21 ENCOUNTER — Telehealth: Payer: Self-pay | Admitting: Cardiology

## 2014-03-21 NOTE — Telephone Encounter (Signed)
Pt is at the dentist office now,said they faxed over a clarence and never received it. Would you please fax asap,pt is there. Please fax [phone]

## 2014-03-21 NOTE — Telephone Encounter (Signed)
Spoke to husband. He states they are at the dentist office now.   Informed husband will need to discuss with D.O.D(Dr Croitoru) , Dr Ellyn Hack did not receive information concerning teeth extractions  Per Dr Sallyanne Kuster ( reviewed last office note 11/10 /2015),okay to proceed with teeth extraction-  Letter routed to dental works RN notified husband. Verbalized understanding.

## 2014-05-03 ENCOUNTER — Other Ambulatory Visit (HOSPITAL_COMMUNITY): Payer: Self-pay

## 2014-05-03 ENCOUNTER — Observation Stay (HOSPITAL_COMMUNITY)
Admission: EM | Admit: 2014-05-03 | Discharge: 2014-05-05 | Disposition: A | Discharge status: Home / Self Care | Payer: Medicare Other | Attending: Internal Medicine | Admitting: Internal Medicine

## 2014-05-03 ENCOUNTER — Encounter (HOSPITAL_COMMUNITY): Payer: Self-pay | Admitting: Emergency Medicine

## 2014-05-03 DIAGNOSIS — Z7982 Long term (current) use of aspirin: Secondary | ICD-10-CM | POA: Diagnosis not present

## 2014-05-03 DIAGNOSIS — I480 Paroxysmal atrial fibrillation: Secondary | ICD-10-CM

## 2014-05-03 DIAGNOSIS — E039 Hypothyroidism, unspecified: Secondary | ICD-10-CM | POA: Diagnosis present

## 2014-05-03 DIAGNOSIS — R05 Cough: Secondary | ICD-10-CM

## 2014-05-03 DIAGNOSIS — I4891 Unspecified atrial fibrillation: Principal | ICD-10-CM | POA: Insufficient documentation

## 2014-05-03 DIAGNOSIS — Z720 Tobacco use: Secondary | ICD-10-CM | POA: Diagnosis present

## 2014-05-03 DIAGNOSIS — K219 Gastro-esophageal reflux disease without esophagitis: Secondary | ICD-10-CM | POA: Diagnosis not present

## 2014-05-03 DIAGNOSIS — I252 Old myocardial infarction: Secondary | ICD-10-CM | POA: Diagnosis not present

## 2014-05-03 DIAGNOSIS — R000 Tachycardia, unspecified: Secondary | ICD-10-CM | POA: Diagnosis not present

## 2014-05-03 DIAGNOSIS — Z955 Presence of coronary angioplasty implant and graft: Secondary | ICD-10-CM | POA: Insufficient documentation

## 2014-05-03 DIAGNOSIS — E876 Hypokalemia: Secondary | ICD-10-CM | POA: Insufficient documentation

## 2014-05-03 DIAGNOSIS — D649 Anemia, unspecified: Secondary | ICD-10-CM | POA: Diagnosis not present

## 2014-05-03 DIAGNOSIS — I25119 Atherosclerotic heart disease of native coronary artery with unspecified angina pectoris: Secondary | ICD-10-CM | POA: Diagnosis present

## 2014-05-03 DIAGNOSIS — Z951 Presence of aortocoronary bypass graft: Secondary | ICD-10-CM

## 2014-05-03 DIAGNOSIS — R002 Palpitations: Secondary | ICD-10-CM | POA: Diagnosis present

## 2014-05-03 DIAGNOSIS — I2510 Atherosclerotic heart disease of native coronary artery without angina pectoris: Secondary | ICD-10-CM | POA: Diagnosis not present

## 2014-05-03 DIAGNOSIS — J449 Chronic obstructive pulmonary disease, unspecified: Secondary | ICD-10-CM | POA: Diagnosis not present

## 2014-05-03 DIAGNOSIS — Z888 Allergy status to other drugs, medicaments and biological substances status: Secondary | ICD-10-CM | POA: Diagnosis not present

## 2014-05-03 DIAGNOSIS — Z86718 Personal history of other venous thrombosis and embolism: Secondary | ICD-10-CM | POA: Diagnosis not present

## 2014-05-03 DIAGNOSIS — E785 Hyperlipidemia, unspecified: Secondary | ICD-10-CM | POA: Diagnosis not present

## 2014-05-03 DIAGNOSIS — Z9049 Acquired absence of other specified parts of digestive tract: Secondary | ICD-10-CM | POA: Diagnosis not present

## 2014-05-03 DIAGNOSIS — R059 Cough, unspecified: Secondary | ICD-10-CM

## 2014-05-03 DIAGNOSIS — F17210 Nicotine dependence, cigarettes, uncomplicated: Secondary | ICD-10-CM | POA: Insufficient documentation

## 2014-05-03 DIAGNOSIS — I2102 ST elevation (STEMI) myocardial infarction involving left anterior descending coronary artery: Secondary | ICD-10-CM | POA: Diagnosis present

## 2014-05-03 HISTORY — DX: Paroxysmal atrial fibrillation: I48.0

## 2014-05-03 LAB — CBC WITH DIFFERENTIAL/PLATELET
Basophils Absolute: 0 10*3/uL (ref 0.0–0.1)
Basophils Relative: 1 % (ref 0–1)
Eosinophils Absolute: 0.2 10*3/uL (ref 0.0–0.7)
Eosinophils Relative: 3 % (ref 0–5)
HCT: 37.8 % (ref 36.0–46.0)
Hemoglobin: 13.1 g/dL (ref 12.0–15.0)
Lymphocytes Relative: 33 % (ref 12–46)
Lymphs Abs: 2.8 10*3/uL (ref 0.7–4.0)
MCH: 32.5 pg (ref 26.0–34.0)
MCHC: 34.7 g/dL (ref 30.0–36.0)
MCV: 93.8 fL (ref 78.0–100.0)
Monocytes Absolute: 0.7 10*3/uL (ref 0.1–1.0)
Monocytes Relative: 9 % (ref 3–12)
Neutro Abs: 4.6 10*3/uL (ref 1.7–7.7)
Neutrophils Relative %: 54 % (ref 43–77)
Platelets: 239 10*3/uL (ref 150–400)
RBC: 4.03 MIL/uL (ref 3.87–5.11)
RDW: 12.5 % (ref 11.5–15.5)
WBC: 8.4 10*3/uL (ref 4.0–10.5)

## 2014-05-03 LAB — I-STAT CHEM 8, ED
BUN: 4 mg/dL — ABNORMAL LOW (ref 6–23)
Calcium, Ion: 1.24 mmol/L — ABNORMAL HIGH (ref 1.12–1.23)
Chloride: 106 mmol/L (ref 96–112)
Creatinine, Ser: 0.7 mg/dL (ref 0.50–1.10)
Glucose, Bld: 104 mg/dL — ABNORMAL HIGH (ref 70–99)
HCT: 40 % (ref 36.0–46.0)
Hemoglobin: 13.6 g/dL (ref 12.0–15.0)
Potassium: 3.4 mmol/L — ABNORMAL LOW (ref 3.5–5.1)
Sodium: 143 mmol/L (ref 135–145)
TCO2: 21 mmol/L (ref 0–100)

## 2014-05-03 MED ORDER — RIVAROXABAN 20 MG PO TABS
20.0000 mg | ORAL_TABLET | Freq: Once | ORAL | Status: AC
  Administered 2014-05-03: 20 mg via ORAL
  Filled 2014-05-03: qty 1

## 2014-05-03 NOTE — ED Notes (Signed)
Sudden onset of weakness, dizziness, and heart racing.  On arrival of EMS noted to be in Afib with rate ranging from 60-160.  Cardizem 20mg  IV given pta.  Rate down to 60-100.  Extensive Cardiac history.

## 2014-05-03 NOTE — ED Provider Notes (Addendum)
CSN: [id number]     Arrival date & time 05/03/14  2218 History   First MD Initiated Contact with Patient 05/03/14 2231     Chief Complaint  Patient presents with  . Dizziness  . Tachycardia     (Consider location/radiation/quality/duration/timing/severity/associated sxs/prior Treatment) HPI Patient developed a feeling of lightheadedness and heart racing and dyspnea onset 8 PM tonight. She was noted by EMS to be in atrial fibrillation at 60-1 60 bpm. She was treated by EMS with Cardizem 20 mg intravenously. She is presently asymptomatic. She denies any chest pain. No other associated symptoms. Past Medical History  Diagnosis Date  . ST elevation (STEMI) myocardial infarction involving left anterior descending coronary artery  01/19/1996    Calculated by VF arrest, and anoxic brain injury with status epilepticus; POBA of LAD  . CAD in native artery 1998    Followup 1 month post MI revealed 95% ISR in PTCA site --> PCI with 3.0 mm x 25 mm BMS stent in proximal LAD --> 5 months later recurrent ISR of LAD BMS that compromised D1 --> referred for CABG  . S/P CABG x 04 July 1996    Initially LIMA-LAD, SVG-D1 --> immediate LIMA failure post CABG with anterior MI --> urgent redo SVG-LAD beyond D2.; Widely patent grafts as of 2005 with left dominant system. Graft to the diagonal branch has a very small target vessel but the vein graft to LAD retrograde fills a large bifurcating D2 and antegrade fills a large D3.  Marland Kitchen Hyperlipidemia   . COPD (chronic obstructive pulmonary disease)   . Tobacco abuse   . Irritable bowel syndrome     Followed by Dr. Juanita Craver.  Marland Kitchen GERD (gastroesophageal reflux disease)   . Hypothyroidism     On Synthroid  . Iron deficiency anemia    Past Surgical History  Procedure Laterality Date  . Appendectomy    . Cesarean section    . Cholecystectomy    . Coronary angioplasty  01/19/1996    acute anterior wall MI, anoxic encephalopahty, VF; LCfx free of disease and dominant,  RCA patent, L main short and patent, LAD with 70-80% narrowing and focal 95% stenosis in distal 3rd with balloon angioplasty (Dr. Marella Chimes)  . Coronary angioplasty with stent placement  02/26/1996    3.0x21mm Multi-Link stent to prox/mid LAD; L main normal & short, Cfx dominant and normal, PDA & PLA normal, RCA non-dominant with small PLA and normal (Dr. Marella Chimes)  . Cardiac catheterization  07/30/1996    normal L main, LAD w/95% stenosis just before stent, diffuse 80-85% end-stent restenosis, 1st diagonal with70-75% ostial stenosis, large optional diagonal that was normal, Cfx with 2 marginals and distal PLA (all normal), RCA normal (Dr. Marella Chimes)  . Cardiac catheterization  01/31/2003    LAD totally occluded in prox 3rd, patent Cfx, SVG to mid LAd patent, SVG to small DX1 patent, LIMA to LAD was atretic and essentially occluded in junction of prox & mid 3rd, R barciocephalic and subclavian were normal (Dr. Marella Chimes)  . Coronary artery bypass graft  07/31/1996    x2; LIMA to LAD and SVG to 1st diagonal (Dr. Remus Loffler)  . Coronary artery bypass graft  07/31/1996    x1; SVG to distal LAD (Dr. Remus Loffler)  . Nm myocar perf wall motion  10/2010    LexiScan Cardiolite - mild perfusion defect r/t infarct/scar with mild periifarct ischemia in mid anterior & apical anterior region; EF 67%; abnormal, low risk study  .  Transthoracic echocardiogram  02/2012    EF 50-55%, mild hypokinesis of anterospetal myocardium; mild MR   Family History  Problem Relation Age of Onset  . Heart failure Other   . Diabetes Other   . Diabetes Mother   . Thyroid disease Paternal Aunt   . Thyroid disease Maternal Grandmother    History  Substance Use Topics  . Smoking status: Current Every Day Smoker -- 0.50 packs/day    Types: Cigarettes  . Smokeless tobacco: Current User     Comment: Quit at time of MI, restarted in 2009  . Alcohol Use: No   OB History    No data available     Review of Systems   Respiratory: Positive for shortness of breath.   Cardiovascular: Positive for palpitations.  Neurological: Positive for light-headedness.  All other systems reviewed and are negative.     Allergies  Iohexol  Home Medications   Prior to Admission medications   Medication Sig Start Date End Date Taking? Authorizing Provider  aspirin EC 81 MG tablet Take 81 mg by mouth daily.    Historical Provider, MD  BUPROPION HBR ER PO Take 150 mg by mouth 2 (two) times daily.     Historical Provider, MD  CALCIUM PO Take 1 tablet by mouth 2 (two) times daily.    Historical Provider, MD  cetirizine (ZYRTEC) 10 MG tablet Take 1 tablet (10 mg total) by mouth daily. 12/30/13   Elayne Snare, MD  Fe Fum-FePoly-Vit C-Vit B3 (INTEGRA PO) Take 1 tablet by mouth daily.     Historical Provider, MD  GuaiFENesin (TUSSIN PO) Take 30 mLs by mouth every 4 (four) hours as needed. For cough    Historical Provider, MD  hyoscyamine (LEVSIN, ANASPAZ) 0.125 MG tablet Take 0.125 mg by mouth 2 (two) times daily.     Historical Provider, MD  metoprolol tartrate (LOPRESSOR) 25 MG tablet Take 1 tablet (25 mg total) by mouth 2 (two) times daily. 11/12/13   Leonie Man, MD  potassium chloride (K-DUR,KLOR-CON) 10 MEQ tablet Take 1 tablet (10 mEq total) by mouth 2 (two) times daily. 11/12/13   Leonie Man, MD  Probiotic Product (ALIGN PO) Take by mouth daily.    Historical Provider, MD  SYNTHROID 100 MCG tablet TAKE 1 TABLET EVERY MORNING ON AN EMPTY STOMACH 02/13/14   Elayne Snare, MD   BP 115/91 mmHg  Pulse 101  Temp(Src) 98.5 F (36.9 C) (Oral)  Resp 14  Ht 5\' 2"  (1.575 m)  Wt 115 lb (52.164 kg)  BMI 21.03 kg/m2  SpO2 98% Physical Exam  Constitutional: She appears well-developed and well-nourished.  HENT:  Head: Normocephalic and atraumatic.  Eyes: Conjunctivae are normal. Pupils are equal, round, and reactive to light.  Neck: Neck supple. No tracheal deviation present. No thyromegaly present.  Cardiovascular:   No murmur heard. Mildly Tachycardic irregularly irregular  Pulmonary/Chest: Effort normal and breath sounds normal.  Abdominal: Soft. Bowel sounds are normal. She exhibits no distension. There is no tenderness.  Musculoskeletal: Normal range of motion. She exhibits no edema or tenderness.  Neurological: She is alert. Coordination normal.  Skin: Skin is warm and dry. No rash noted.  Psychiatric: She has a normal mood and affect.  Nursing note and vitals reviewed.   ED Course  Procedures (including critical care time) Labs Review Labs Reviewed - No data to display  Imaging Review No results found.   EKG Interpretation   Date/Time:  Saturday May 03 2014 22:24:32 EDT Ventricular  Rate:  108 PR Interval:    QRS Duration: 78 QT Interval:  335 QTC Calculation: 449 R Axis:   54 Text Interpretation:  Atrial fibrillation Low voltage, precordial leads  Consider anterior infarct Repol abnrm suggests ischemia, diffuse leads  Confirmed by Winfred Leeds  MD, Magdala Brahmbhatt ([id number]) on 05/03/2014 10:54:44 PM     12:4 AM patient's heart rate noted to be 1 30 bpm, atrial fibrillation. Intravenous Cardizem drip ordered Results for orders placed or performed during the hospital encounter of 05/03/14  CBC with Differential/Platelet  Result Value Ref Range   WBC 8.4 4.0 - 10.5 K/uL   RBC 4.03 3.87 - 5.11 MIL/uL   Hemoglobin 13.1 12.0 - 15.0 g/dL   HCT 37.8 36.0 - 46.0 %   MCV 93.8 78.0 - 100.0 fL   MCH 32.5 26.0 - 34.0 pg   MCHC 34.7 30.0 - 36.0 g/dL   RDW 12.5 11.5 - 15.5 %   Platelets 239 150 - 400 K/uL   Neutrophils Relative % 54 43 - 77 %   Neutro Abs 4.6 1.7 - 7.7 K/uL   Lymphocytes Relative 33 12 - 46 %   Lymphs Abs 2.8 0.7 - 4.0 K/uL   Monocytes Relative 9 3 - 12 %   Monocytes Absolute 0.7 0.1 - 1.0 K/uL   Eosinophils Relative 3 0 - 5 %   Eosinophils Absolute 0.2 0.0 - 0.7 K/uL   Basophils Relative 1 0 - 1 %   Basophils Absolute 0.0 0.0 - 0.1 K/uL  TSH  Result Value Ref Range   TSH  1.435 0.350 - 4.500 uIU/mL  I-stat chem 8, ed  Result Value Ref Range   Sodium 143 135 - 145 mmol/L   Potassium 3.4 (L) 3.5 - 5.1 mmol/L   Chloride 106 96 - 112 mmol/L   BUN 4 (L) 6 - 23 mg/dL   Creatinine, Ser 0.70 0.50 - 1.10 mg/dL   Glucose, Bld 104 (H) 70 - 99 mg/dL   Calcium, Ion 1.24 (H) 1.12 - 1.23 mmol/L   TCO2 21 0 - 100 mmol/L   Hemoglobin 13.6 12.0 - 15.0 g/dL   HCT 40.0 36.0 - 46.0 %   No results found.  MDM  Spoke with Dr. Radford Pax who requests overnight in hospital stay, be arranged by hospitalist. Xarelto 20 mg.. The medicine service can consult cardiology at their discretion or if she does not convert to sinus rhythm by tomorrow morning Final diagnoses:  None   spoke with Dr. Arnoldo Morale plan 23 hour observation to step down unit Diagnosis #1atrial fibrillation with rapid ventricular response #2 HYPOKALEMIA CRITICAL CARE Performed by: Orlie Dakin  ?  Total critical care time: 30 MINUTE Critical care time was exclusive of separately billable procedures and treating other patients.  Critical care was necessary to treat or prevent imminent or life-threatening deterioration.  Critical care was time spent personally by me on the following activities: development of treatment plan with patient and/or surrogate as well as nursing, discussions with consultants, evaluation of patient's response to treatment, examination of patient, obtaining history from patient or surrogate, ordering and performing treatments and interventions, ordering and review of laboratory studies, ordering and review of radiographic studies, pulse oximetry and re-evaluation of patient's condition.    Orlie Dakin, MD 05/04/14 0134  Addendum 1:55 AM patient noted to be in sinus rhytHM at 61 bpm. Patient asymptomatic. dR jENKINS WILL ADMIT TO Ron Parker, MD 05/04/14 9485  Orlie Dakin, MD 05/04/14 4627

## 2014-05-04 ENCOUNTER — Encounter (HOSPITAL_COMMUNITY): Payer: Self-pay | Admitting: *Deleted

## 2014-05-04 ENCOUNTER — Observation Stay (HOSPITAL_COMMUNITY): Payer: Medicare Other

## 2014-05-04 DIAGNOSIS — I480 Paroxysmal atrial fibrillation: Secondary | ICD-10-CM | POA: Diagnosis present

## 2014-05-04 DIAGNOSIS — J449 Chronic obstructive pulmonary disease, unspecified: Secondary | ICD-10-CM | POA: Diagnosis present

## 2014-05-04 DIAGNOSIS — R05 Cough: Secondary | ICD-10-CM | POA: Diagnosis not present

## 2014-05-04 DIAGNOSIS — I4891 Unspecified atrial fibrillation: Principal | ICD-10-CM

## 2014-05-04 DIAGNOSIS — I2510 Atherosclerotic heart disease of native coronary artery without angina pectoris: Secondary | ICD-10-CM | POA: Diagnosis not present

## 2014-05-04 DIAGNOSIS — E039 Hypothyroidism, unspecified: Secondary | ICD-10-CM

## 2014-05-04 DIAGNOSIS — E785 Hyperlipidemia, unspecified: Secondary | ICD-10-CM

## 2014-05-04 DIAGNOSIS — Z720 Tobacco use: Secondary | ICD-10-CM

## 2014-05-04 LAB — CBC
HCT: 35.2 % — ABNORMAL LOW (ref 36.0–46.0)
Hemoglobin: 11.9 g/dL — ABNORMAL LOW (ref 12.0–15.0)
MCH: 32.2 pg (ref 26.0–34.0)
MCHC: 33.8 g/dL (ref 30.0–36.0)
MCV: 95.4 fL (ref 78.0–100.0)
Platelets: 225 10*3/uL (ref 150–400)
RBC: 3.69 MIL/uL — ABNORMAL LOW (ref 3.87–5.11)
RDW: 12.6 % (ref 11.5–15.5)
WBC: 7.5 10*3/uL (ref 4.0–10.5)

## 2014-05-04 LAB — BASIC METABOLIC PANEL
Anion gap: 9 (ref 5–15)
BUN: <5 mg/dL — ABNORMAL LOW (ref 6–20)
CO2: 24 mmol/L (ref 22–32)
Calcium: 9.3 mg/dL (ref 8.9–10.3)
Chloride: 108 mmol/L (ref 101–111)
Creatinine, Ser: 1.09 mg/dL — ABNORMAL HIGH (ref 0.44–1.00)
GFR calc Af Amer: >60 mL/min (ref >60)
GFR calc non Af Amer: 55 mL/min — ABNORMAL LOW (ref >60)
Glucose, Bld: 159 mg/dL — ABNORMAL HIGH (ref 70–99)
Potassium: 3.6 mmol/L (ref 3.5–5.1)
Sodium: 141 mmol/L (ref 135–145)

## 2014-05-04 LAB — TSH
TSH: 1.435 u[IU]/mL (ref 0.350–4.500)
TSH: 1.165 u[IU]/mL (ref 0.350–4.500)

## 2014-05-04 LAB — MAGNESIUM: Magnesium: 1.8 mg/dL (ref 1.7–2.4)

## 2014-05-04 MED ORDER — IPRATROPIUM BROMIDE 0.02 % IN SOLN
0.5000 mg | Freq: Three times a day (TID) | RESPIRATORY_TRACT | Status: DC
  Administered 2014-05-04: 0.5 mg via RESPIRATORY_TRACT
  Filled 2014-05-04 (×2): qty 2.5

## 2014-05-04 MED ORDER — POTASSIUM CHLORIDE CRYS ER 10 MEQ PO TBCR
10.0000 meq | EXTENDED_RELEASE_TABLET | Freq: Two times a day (BID) | ORAL | Status: DC
  Administered 2014-05-04 – 2014-05-05 (×3): 10 meq via ORAL
  Filled 2014-05-04 (×4): qty 1

## 2014-05-04 MED ORDER — SODIUM CHLORIDE 0.9 % IJ SOLN: 3.0000 mL | INTRAMUSCULAR | Status: DC | PRN

## 2014-05-04 MED ORDER — ONDANSETRON HCL 4 MG/2ML IJ SOLN: 4.0000 mg | Freq: Four times a day (QID) | INTRAMUSCULAR | Status: DC | PRN

## 2014-05-04 MED ORDER — LEVALBUTEROL HCL 1.25 MG/0.5ML IN NEBU
1.2500 mg | INHALATION_SOLUTION | Freq: Three times a day (TID) | RESPIRATORY_TRACT | Status: DC
  Administered 2014-05-04: 1.25 mg via RESPIRATORY_TRACT
  Filled 2014-05-04 (×4): qty 0.5

## 2014-05-04 MED ORDER — CALCIUM CARBONATE 1250 (500 CA) MG PO TABS
1250.0000 mg | ORAL_TABLET | Freq: Every day | ORAL | Status: DC
  Administered 2014-05-04 – 2014-05-05 (×2): 1250 mg via ORAL
  Filled 2014-05-04 (×4): qty 1

## 2014-05-04 MED ORDER — HYDROMORPHONE HCL 1 MG/ML IJ SOLN: 0.5000 mg | INTRAMUSCULAR | Status: DC | PRN

## 2014-05-04 MED ORDER — METOPROLOL TARTRATE 50 MG PO TABS
50.0000 mg | ORAL_TABLET | Freq: Two times a day (BID) | ORAL | Status: DC
  Filled 2014-05-04: qty 1

## 2014-05-04 MED ORDER — ASPIRIN EC 81 MG PO TBEC
81.0000 mg | DELAYED_RELEASE_TABLET | Freq: Every day | ORAL | Status: DC
  Administered 2014-05-04: 81 mg via ORAL
  Filled 2014-05-04: qty 1

## 2014-05-04 MED ORDER — OXYCODONE HCL 5 MG PO TABS: 5.0000 mg | ORAL_TABLET | ORAL | Status: DC | PRN

## 2014-05-04 MED ORDER — ACETAMINOPHEN 325 MG PO TABS
650.0000 mg | ORAL_TABLET | Freq: Four times a day (QID) | ORAL | Status: DC | PRN
  Administered 2014-05-04 (×2): 650 mg via ORAL
  Filled 2014-05-04 (×2): qty 2

## 2014-05-04 MED ORDER — RIVAROXABAN 15 MG PO TABS
15.0000 mg | ORAL_TABLET | Freq: Every day | ORAL | Status: DC
  Administered 2014-05-04: 15 mg via ORAL
  Filled 2014-05-04 (×2): qty 1

## 2014-05-04 MED ORDER — RIVAROXABAN 20 MG PO TABS
20.0000 mg | ORAL_TABLET | Freq: Every day | ORAL | Status: DC
  Filled 2014-05-04: qty 1

## 2014-05-04 MED ORDER — SODIUM CHLORIDE 0.9 % IJ SOLN
3.0000 mL | Freq: Two times a day (BID) | INTRAMUSCULAR | Status: DC
  Administered 2014-05-04: 3 mL via INTRAVENOUS

## 2014-05-04 MED ORDER — POTASSIUM CHLORIDE CRYS ER 20 MEQ PO TBCR
40.0000 meq | EXTENDED_RELEASE_TABLET | Freq: Once | ORAL | Status: AC
  Administered 2014-05-04: 40 meq via ORAL
  Filled 2014-05-04: qty 2

## 2014-05-04 MED ORDER — LEVALBUTEROL HCL 0.63 MG/3ML IN NEBU: 0.6300 mg | INHALATION_SOLUTION | Freq: Four times a day (QID) | RESPIRATORY_TRACT | Status: DC | PRN

## 2014-05-04 MED ORDER — ONDANSETRON HCL 4 MG PO TABS: 4.0000 mg | ORAL_TABLET | Freq: Four times a day (QID) | ORAL | Status: DC | PRN

## 2014-05-04 MED ORDER — METOPROLOL TARTRATE 25 MG PO TABS
37.5000 mg | ORAL_TABLET | Freq: Two times a day (BID) | ORAL | Status: DC
  Administered 2014-05-04 – 2014-05-05 (×2): 37.5 mg via ORAL
  Filled 2014-05-04 (×3): qty 1

## 2014-05-04 MED ORDER — ALUM & MAG HYDROXIDE-SIMETH 200-200-20 MG/5ML PO SUSP: 30.0000 mL | Freq: Four times a day (QID) | ORAL | Status: DC | PRN

## 2014-05-04 MED ORDER — SODIUM CHLORIDE 0.9 % IV SOLN: 250.0000 mL | INTRAVENOUS | Status: DC | PRN

## 2014-05-04 MED ORDER — RISAQUAD PO CAPS
1.0000 | ORAL_CAPSULE | Freq: Every day | ORAL | Status: DC
  Administered 2014-05-04 – 2014-05-05 (×2): 1 via ORAL
  Filled 2014-05-04 (×2): qty 1

## 2014-05-04 MED ORDER — NICOTINE 7 MG/24HR TD PT24
7.0000 mg | MEDICATED_PATCH | Freq: Every day | TRANSDERMAL | Status: DC
  Administered 2014-05-04 – 2014-05-05 (×2): 7 mg via TRANSDERMAL
  Filled 2014-05-04 (×2): qty 1

## 2014-05-04 MED ORDER — METOPROLOL TARTRATE 25 MG PO TABS
25.0000 mg | ORAL_TABLET | Freq: Two times a day (BID) | ORAL | Status: DC
  Administered 2014-05-04: 25 mg via ORAL
  Filled 2014-05-04 (×2): qty 1

## 2014-05-04 MED ORDER — DILTIAZEM HCL 30 MG PO TABS
30.0000 mg | ORAL_TABLET | Freq: Four times a day (QID) | ORAL | Status: DC
  Filled 2014-05-04 (×5): qty 1

## 2014-05-04 MED ORDER — DILTIAZEM HCL 100 MG IV SOLR
10.0000 mg/h | Freq: Once | INTRAVENOUS | Status: AC
  Administered 2014-05-04: 10 mg/h via INTRAVENOUS

## 2014-05-04 MED ORDER — SODIUM CHLORIDE 0.9 % IJ SOLN
3.0000 mL | Freq: Two times a day (BID) | INTRAMUSCULAR | Status: DC
  Administered 2014-05-04 – 2014-05-05 (×3): 3 mL via INTRAVENOUS

## 2014-05-04 MED ORDER — ACETAMINOPHEN 650 MG RE SUPP: 650.0000 mg | Freq: Four times a day (QID) | RECTAL | Status: DC | PRN

## 2014-05-04 MED ORDER — HYOSCYAMINE SULFATE 0.125 MG PO TABS
0.1250 mg | ORAL_TABLET | Freq: Two times a day (BID) | ORAL | Status: DC
  Administered 2014-05-04 (×2): 0.125 mg via ORAL
  Filled 2014-05-04 (×4): qty 1

## 2014-05-04 MED ORDER — DOXYCYCLINE HYCLATE 100 MG PO TABS
100.0000 mg | ORAL_TABLET | Freq: Two times a day (BID) | ORAL | Status: DC
  Administered 2014-05-04 – 2014-05-05 (×3): 100 mg via ORAL
  Filled 2014-05-04 (×4): qty 1

## 2014-05-04 MED ORDER — LEVOTHYROXINE SODIUM 100 MCG PO TABS
100.0000 ug | ORAL_TABLET | Freq: Every day | ORAL | Status: DC
  Administered 2014-05-04 – 2014-05-05 (×2): 100 ug via ORAL
  Filled 2014-05-04 (×3): qty 1

## 2014-05-04 NOTE — Consult Note (Addendum)
Primary cardiologist: Dr Glenetta Hew MD Consulting cardiologist: Dr Carlyle Dolly MD  Clinical Summary Dominique Barber is a 59 y.o.female hx of CAD with prior anterior STEMIT Jan 1998 with VF arrest and anoxic brain injury s/p POBA to LAD. Complex further CAD history as documented below including CABG in 1998. Hx of HL, COPD, admitted with palpitations and SOB. Found to be in afib with RVR on admit, a new diagnosis. She was started on IV cardizem and xarelto for stoke preventoin.   Hgb 13.1, Plt 239, TSH 1.1, K 3.4, Cr 0.7 No CXR EKG afib RVR. Repeat EKG this AM sinus brady high 50s   Allergies  Allergen Reactions  . Iohexol Hives and Rash    Red dye    Medications Scheduled Medications: . acidophilus  1 capsule Oral Daily  . aspirin EC  81 mg Oral Daily  . calcium carbonate  1,250 mg Oral Q breakfast  . diltiazem  30 mg Oral 4 times per day  . doxycycline  100 mg Oral Q12H  . hyoscyamine  0.125 mg Oral BID  . ipratropium  0.5 mg Nebulization 3 times per day  . levalbuterol  1.25 mg Nebulization 3 times per day  . levothyroxine  100 mcg Oral QAC breakfast  . metoprolol tartrate  25 mg Oral BID  . nicotine  7 mg Transdermal Daily  . potassium chloride  10 mEq Oral BID  . rivaroxaban  15 mg Oral Q supper  . sodium chloride  3 mL Intravenous Q12H  . sodium chloride  3 mL Intravenous Q12H     Infusions:     PRN Medications:  sodium chloride, acetaminophen **OR** acetaminophen, alum & mag hydroxide-simeth, HYDROmorphone (DILAUDID) injection, ondansetron **OR** ondansetron (ZOFRAN) IV, oxyCODONE, sodium chloride   Past Medical History  Diagnosis Date  . ST elevation (STEMI) myocardial infarction involving left anterior descending coronary artery  01/19/1996    Calculated by VF arrest, and anoxic brain injury with status epilepticus; POBA of LAD  . CAD in native artery 1998    Followup 1 month post MI revealed 95% ISR in PTCA site --> PCI with 3.0 mm x 25 mm BMS  stent in proximal LAD --> 5 months later recurrent ISR of LAD BMS that compromised D1 --> referred for CABG  . S/P CABG x 04 July 1996    Initially LIMA-LAD, SVG-D1 --> immediate LIMA failure post CABG with anterior MI --> urgent redo SVG-LAD beyond D2.; Widely patent grafts as of 2005 with left dominant system. Graft to the diagonal Dominique Barber has a very small target vessel but the vein graft to LAD retrograde fills a large bifurcating D2 and antegrade fills a large D3.  Marland Kitchen Hyperlipidemia   . COPD (chronic obstructive pulmonary disease)   . Tobacco abuse   . Irritable bowel syndrome     Followed by Dr. Juanita Craver.  Marland Kitchen GERD (gastroesophageal reflux disease)   . Hypothyroidism     On Synthroid  . Iron deficiency anemia     Past Surgical History  Procedure Laterality Date  . Appendectomy    . Cesarean section    . Cholecystectomy    . Coronary angioplasty  01/19/1996    acute anterior wall MI, anoxic encephalopahty, VF; LCfx free of disease and dominant, RCA patent, L main short and patent, LAD with 70-80% narrowing and focal 95% stenosis in distal 3rd with balloon angioplasty (Dr. Marella Chimes)  . Coronary angioplasty with stent placement  02/26/1996    3.0x58mm  Multi-Link stent to prox/mid LAD; L main normal & short, Cfx dominant and normal, PDA & PLA normal, RCA non-dominant with small PLA and normal (Dr. Marella Chimes)  . Cardiac catheterization  07/30/1996    normal L main, LAD w/95% stenosis just before stent, diffuse 80-85% end-stent restenosis, 1st diagonal with70-75% ostial stenosis, large optional diagonal that was normal, Cfx with 2 marginals and distal PLA (all normal), RCA normal (Dr. Marella Chimes)  . Cardiac catheterization  01/31/2003    LAD totally occluded in prox 3rd, patent Cfx, SVG to mid LAd patent, SVG to small DX1 patent, LIMA to LAD was atretic and essentially occluded in junction of prox & mid 3rd, R barciocephalic and subclavian were normal (Dr. Marella Chimes)  . Coronary  artery bypass graft  07/31/1996    x2; LIMA to LAD and SVG to 1st diagonal (Dr. Remus Loffler)  . Coronary artery bypass graft  07/31/1996    x1; SVG to distal LAD (Dr. Remus Loffler)  . Nm myocar perf wall motion  10/2010    LexiScan Cardiolite - mild perfusion defect r/t infarct/scar with mild periifarct ischemia in mid anterior & apical anterior region; EF 67%; abnormal, low risk study  . Transthoracic echocardiogram  02/2012    EF 50-55%, mild hypokinesis of anterospetal myocardium; mild MR    Family History  Problem Relation Age of Onset  . Heart failure Other   . Diabetes Other   . Diabetes Mother   . Thyroid disease Paternal Aunt   . Thyroid disease Maternal Grandmother     Social History Dominique Barber reports that she has been smoking Cigarettes.  She has been smoking about 0.50 packs per day. She uses smokeless tobacco. Dominique Barber reports that she does not drink alcohol.  Review of Systems CONSTITUTIONAL: No weight loss, fever, chills, weakness or fatigue.  HEENT: Eyes: No visual loss, blurred vision, double vision or yellow sclerae. No hearing loss, sneezing, congestion, runny nose or sore throat.  SKIN: No rash or itching.  CARDIOVASCULAR:per HPI  RESPIRATORY: No shortness of breath, cough or sputum.  GASTROINTESTINAL: No anorexia, nausea, vomiting or diarrhea. No abdominal pain or blood.  GENITOURINARY: no polyuria, no dysuria NEUROLOGICAL: No headache, dizziness, syncope, paralysis, ataxia, numbness or tingling in the extremities. No change in bowel or bladder control.  MUSCULOSKELETAL: No muscle, back pain, joint pain or stiffness.  HEMATOLOGIC: No anemia, bleeding or bruising.  LYMPHATICS: No enlarged nodes. No history of splenectomy.  PSYCHIATRIC: No history of depression or anxiety.      Physical Examination Blood pressure 126/54, pulse 52, temperature 98.7 F (37.1 C), temperature source Oral, resp. rate 14, height 5\' 2"  (1.575 m), weight 106 lb 8 oz (48.308 kg),  SpO2 98 %.  Intake/Output Summary (Last 24 hours) at 05/04/14 1334 Last data filed at 05/04/14 1000  Gross per 24 hour  Intake    543 ml  Output      0 ml  Net    543 ml    HEENT: sclera clear  Cardiovascular: RRR, no m/r/g, no JVD, no carotid bruits  Respiratory: CTAB  GI: abdomen soft, NT, ND  MSK: no LE edema  Neuro: no focal deficits  Psych: appropirate affect   Lab Results  Basic Metabolic Panel:  Recent Labs Lab 05/03/14 2342 05/04/14 0555  NA 143 141  K 3.4* 3.6  CL 106 108  CO2  --  24  GLUCOSE 104* 159*  BUN 4* <5*  CREATININE 0.70 1.09*  CALCIUM  --  9.3    Liver Function Tests: No results for input(s): AST, ALT, ALKPHOS, BILITOT, PROT, ALBUMIN in the last 168 hours.  CBC:  Recent Labs Lab 05/03/14 2317 05/03/14 2342 05/04/14 0555  WBC 8.4  --  7.5  NEUTROABS 4.6  --   --   HGB 13.1 13.6 11.9*  HCT 37.8 40.0 35.2*  MCV 93.8  --  95.4  PLT 239  --  225    Cardiac Enzymes: No results for input(s): CKTOTAL, CKMB, CKMBINDEX, TROPONINI in the last 168 hours.  BNP: Invalid input(s): POCBNP    Impression/Recommendations 1. New onset afib - admitted with afib with RVR, initially on dilt gtt. Converted to NSR, no on oral dilt and lopressor - would stop oral dilt as she was only on low dose lopressor at home, increase lopressor to 37.5mg  bid to simplify her regimen. Careful titration given sinus brady and borderline soft bp's - CHADS2Vasc score is 2 (CAD, gender), she is appropriately on xarelto 20mg  daily. Would stop ASA - f/u echo   2. CAD - no current symptoms, continue current meds   Carlyle Dolly, M.D.

## 2014-05-04 NOTE — Progress Notes (Signed)
Patient seen and examined, heart rate back to NSR, does has intermittent dry cough, mild wheezes bilateral lung, on room air, no increase WOB, will get portable CXR, for now will treat as bronchitis/mild copd with doxycycline/nebs. Talked to cardiology, patient was not on their list, now officially consulted.

## 2014-05-04 NOTE — H&P (Signed)
[facility] Admission History and Physical       Dominique Barber ACZ:[id number] DOB: 1955/01/09 DOA: 05/03/2014  Referring physician: EDP PCP: Dominique Snare, MD  Specialists:   Chief Complaint: Palpitations  HPI: Dominique Barber is a 59 y.o. female with a history of CAD, COPD, Hypothyroid, Hyperlipidemia, IBS who presented to the ED with complaints of palpitations and a feeling of her heart racing. She had associated SOB.  EMS found her Heart rate at 160, and she was in atrial fibrillation and she was administered an  IV Cardizem bolus in the filed, and in the ED she was placed on a Cardizem drip.   She converted to sinus rhythm and her rate improved.  The EDP spoke with Cards on call Dominique Barber who recommended started Xarelto and to continue with observation.      Review of Systems: Constitutional: No Weight Loss, No Weight Gain, Night Sweats, Fevers, Chills, Dizziness, Light Headedness, Fatigue, or Generalized Weakness HEENT: No Headaches, Difficulty Swallowing,Tooth/Dental Problems,Sore Throat,  No Sneezing, Rhinitis, Ear Ache, Nasal Congestion, or Post Nasal Drip,  Cardio-vascular:  No Chest pain, Orthopnea, PND, Edema in Lower Extremities, Anasarca, Dizziness, Palpitations  Resp: No Dyspnea, No DOE, No Productive Cough, No Non-Productive Cough, No Hemoptysis, No Wheezing.    GI: No Heartburn, Indigestion, Abdominal Pain, Nausea, Vomiting, Diarrhea, Constipation, Hematemesis, Hematochezia, Melena, Change in Bowel Habits,  Loss of Appetite  GU: No Dysuria, No Change in Color of Urine, No Urgency or Urinary Frequency, No Flank pain.  Musculoskeletal: No Joint Pain or Swelling, No Decreased Range of Motion, No Back Pain.  Neurologic: No Syncope, No Seizures, Muscle Weakness, Paresthesia, Vision Disturbance or Loss, No Diplopia, No Vertigo, No Difficulty Walking,  Skin: No Rash or Lesions. Psych: No Change in Mood or Affect, No Depression or Anxiety, No Memory loss, No Confusion,  or Hallucinations   Past Medical History  Diagnosis Date  . ST elevation (STEMI) myocardial infarction involving left anterior descending coronary artery  01/19/1996    Calculated by Dominique Barber arrest, and anoxic brain injury with status epilepticus; POBA of LAD  . CAD in native artery 1998    Followup 1 month post MI revealed 95% ISR in PTCA site --> PCI with 3.0 mm x 25 mm BMS stent in proximal LAD --> 5 months later recurrent ISR of LAD BMS that compromised D1 --> referred for CABG  . S/P CABG x 04 July 1996    Initially LIMA-LAD, SVG-D1 --> immediate LIMA failure post CABG with anterior MI --> urgent redo SVG-LAD beyond D2.; Widely patent grafts as of 2005 with left dominant system. Graft to the diagonal branch has a very small target vessel but the vein graft to LAD retrograde fills a large bifurcating D2 and antegrade fills a large D3.  Marland Kitchen Hyperlipidemia   . COPD (chronic obstructive pulmonary disease)   . Tobacco abuse   . Irritable bowel syndrome     Followed by Dominique Barber.  Marland Kitchen GERD (gastroesophageal reflux disease)   . Hypothyroidism     On Synthroid  . Iron deficiency anemia      Past Surgical History  Procedure Laterality Date  . Appendectomy    . Cesarean section    . Cholecystectomy    . Coronary angioplasty  01/19/1996    acute anterior wall MI, anoxic encephalopahty, Dominique Barber; LCfx free of disease and dominant, RCA patent, L main short and patent, LAD with 70-80% narrowing and focal 95% stenosis in distal 3rd with balloon angioplasty (Dr.  R. Rollene Barber)  . Coronary angioplasty with stent placement  02/26/1996    3.0x17mm Multi-Link stent to prox/mid LAD; L main normal & short, Cfx dominant and normal, PDA & PLA normal, RCA non-dominant with small PLA and normal (Dominique Barber)  . Cardiac catheterization  07/30/1996    normal L main, LAD w/95% stenosis just before stent, diffuse 80-85% end-stent restenosis, 1st diagonal with70-75% ostial stenosis, large optional diagonal that was  normal, Cfx with 2 marginals and distal PLA (all normal), RCA normal (Dominique Barber)  . Cardiac catheterization  01/31/2003    LAD totally occluded in prox 3rd, patent Cfx, SVG to mid LAd patent, SVG to small DX1 patent, LIMA to LAD was atretic and essentially occluded in junction of prox & mid 3rd, R barciocephalic and subclavian were normal (Dominique Barber)  . Coronary artery bypass graft  07/31/1996    x2; LIMA to LAD and SVG to 1st diagonal (Dr. Remus Barber)  . Coronary artery bypass graft  07/31/1996    x1; SVG to distal LAD (Dr. Remus Barber)  . Nm myocar perf wall motion  10/2010    LexiScan Cardiolite - mild perfusion defect r/t infarct/scar with mild periifarct ischemia in mid anterior & apical anterior region; EF 67%; abnormal, low risk study  . Transthoracic echocardiogram  02/2012    EF 50-55%, mild hypokinesis of anterospetal myocardium; mild MR      Prior to Admission medications   Medication Sig Start Date End Date Taking? Authorizing Provider  aspirin EC 81 MG tablet Take 81 mg by mouth daily.   Yes Historical Provider, MD  CALCIUM PO Take 1 tablet by mouth daily.    Yes Historical Provider, MD  cetirizine (ZYRTEC) 10 MG tablet Take 1 tablet (10 mg total) by mouth daily. 12/30/13  Yes Dominique Snare, MD  Fe Fum-FePoly-Vit C-Vit B3 (INTEGRA PO) Take 1 tablet by mouth daily.    Yes Historical Provider, MD  GuaiFENesin (TUSSIN PO) Take 30 mLs by mouth every 4 (four) hours as needed. For cough   Yes Historical Provider, MD  hyoscyamine (LEVSIN, ANASPAZ) 0.125 MG tablet Take 0.125 mg by mouth 2 (two) times daily.    Yes Historical Provider, MD  metoprolol tartrate (LOPRESSOR) 25 MG tablet Take 1 tablet (25 mg total) by mouth 2 (two) times daily. 11/12/13  Yes Dominique Man, MD  potassium chloride (K-DUR,KLOR-CON) 10 MEQ tablet Take 1 tablet (10 mEq total) by mouth 2 (two) times daily. Patient taking differently: Take 10 mEq by mouth once.  11/12/13  Yes Dominique Man, MD  Probiotic  Product (ALIGN PO) Take by mouth daily.   Yes Historical Provider, MD  SYNTHROID 100 MCG tablet TAKE 1 TABLET EVERY MORNING ON AN EMPTY STOMACH 02/13/14  Yes Dominique Snare, MD  BUPROPION HBR ER PO Take 150 mg by mouth 2 (two) times daily.     Historical Provider, MD     Allergies  Allergen Reactions  . Iohexol Hives and Rash    Red dye    Social History:  reports that she has been smoking Cigarettes.  She has been smoking about 0.50 packs per day. She uses smokeless tobacco. She reports that she does not drink alcohol. Her drug history is not on file.    Family History  Problem Relation Age of Onset  . Heart failure Other   . Diabetes Other   . Diabetes Mother   . Thyroid disease Paternal Aunt   . Thyroid disease Maternal Grandmother  Physical Exam:  GEN:  Pleasant Thin  59 y.o. Caucasian female examined and in no acute distress; cooperative with exam Filed Vitals:   05/03/14 2345 05/04/14 0015 05/04/14 0033 05/04/14 0036  BP: 120/95 90/67 104/73   Pulse: 129 116 37 118  Temp:      TempSrc:      Resp: 14 11 14 9   Height:      Weight:      SpO2: 99% 94% 92% 94%   Blood pressure 104/73, pulse 118, temperature 98.5 F (36.9 C), temperature source Oral, resp. rate 9, height 5\' 2"  (1.575 m), weight 52.164 kg (115 lb), SpO2 94 %. PSYCH: She is alert and oriented x4; does not appear anxious does not appear depressed; affect is normal HEENT: Normocephalic and Atraumatic, Mucous membranes pink; PERRLA; EOM intact; Fundi:  Benign;  No scleral icterus, Nares: Patent, Oropharynx: Clear, Edentulous;    Neck:  FROM, No Cervical Lymphadenopathy nor Thyromegaly or Carotid Bruit; No JVD; Breasts:: Not examined CHEST WALL: No tenderness CHEST: Normal respiration, clear to auscultation bilaterally HEART: Regular rate and rhythm; no murmurs rubs or gallops BACK: No kyphosis or scoliosis; No CVA tenderness ABDOMEN: Positive Bowel Sounds, Scaphoid, Soft Non-Tender, No Rebound or Guarding;  No Masses, No Organomegaly Rectal Exam: Not done EXTREMITIES: No Cyanosis, Clubbing, or Edema; No Ulcerations. Genitalia: not examined PULSES: 2+ and symmetric SKIN: Normal hydration no rash or ulceration CNS:  Alert and Oriented x 4, No Focal Deficits Vascular: pulses palpable throughout    Labs on Admission:  Basic Metabolic Panel:  Recent Labs Lab 05/03/14 2342  NA 143  K 3.4*  CL 106  GLUCOSE 104*  BUN 4*  CREATININE 0.70   Liver Function Tests: No results for input(s): AST, ALT, ALKPHOS, BILITOT, PROT, ALBUMIN in the last 168 hours. No results for input(s): LIPASE, AMYLASE in the last 168 hours. No results for input(s): AMMONIA in the last 168 hours. CBC:  Recent Labs Lab 05/03/14 2317 05/03/14 2342  WBC 8.4  --   NEUTROABS 4.6  --   HGB 13.1 13.6  HCT 37.8 40.0  MCV 93.8  --   PLT 239  --    Cardiac Enzymes: No results for input(s): CKTOTAL, CKMB, CKMBINDEX, TROPONINI in the last 168 hours.  BNP (last 3 results) No results for input(s): BNP in the last 8760 hours.  ProBNP (last 3 results) No results for input(s): PROBNP in the last 8760 hours.  CBG: No results for input(s): GLUCAP in the last 168 hours.  Radiological Exams on Admission: No results found.   EKG: Independently reviewed. #1 Atrial Fibrillation with RVR Rate =  108     #2  Sinus Bradycardia rate =59, No Acute S-T Changes     Assessment/Plan:   59 y.o. female with  Principal Problem:    1.   Atrial fibrillation with rapid ventricular response    Cardiac Monitoring    IV Cardizem, transitioned to Oral Cardizem    Continue Metoprolol    Xarelto started   Active Problems:                               2.   CAD- 100% LAD, s/p SVG-LAD after failed LIMA-LAD/ S/P CABG x 2/  ST elevation (STEMI) myocardial infarction involving left anterior descending coronary artery    Continue Metoprolol and ASA Rx      3.   Hyperlipidemia with target LDL less than  70    Not on Rx  Currently        4.   Hypothyroid    Check TSH    Continue Levothyroxine Rx     5.   Tobacco abuse    Nicotine Patch Daily      6.   COPD (chronic obstructive pulmonary disease)    stable      7.   DVT Prophylaxis     Xarelto Started      Code Status:     FULL CODE       Family Communication:     No Family Present    Disposition Plan:   Observation Status        Time spent:  [facility] Pager [phone]   If [place] Please Contact the Day Rounding Team MD for [facility]  If 7PM-7AM, Please Contact Night-Floor Coverage  [email or web address] Password [email or web address] 05/04/2014, 1:56 AM     ADDENDUM:   Patient was seen and examined on 05/04/2014

## 2014-05-04 NOTE — Discharge Instructions (Addendum)
Information on my medicine - XARELTO (Rivaroxaban)  This medication education was reviewed with me or my healthcare representative as part of my discharge preparation.  The pharmacist that spoke with me during my hospital stay was:  Adora Fridge, [facility]  Why was Xarelto prescribed for you? Xarelto was prescribed for you to reduce the risk of a blood clot forming that can cause a stroke if you have a medical condition called atrial fibrillation (a type of irregular heartbeat).  What do you need to know about xarelto ? Take your Xarelto ONCE DAILY at the same time every day with your evening meal. If you have difficulty swallowing the tablet whole, you may crush it and mix in applesauce just prior to taking your dose.  Take Xarelto exactly as prescribed by your doctor and DO NOT stop taking Xarelto without talking to the doctor who prescribed the medication.  Stopping without other stroke prevention medication to take the place of Xarelto may increase your risk of developing a clot that causes a stroke.  Refill your prescription before you run out.  After discharge, you should have regular check-up appointments with your healthcare provider that is prescribing your Xarelto.  In the future your dose may need to be changed if your kidney function or weight changes by a significant amount.  What do you do if you miss a dose? If you are taking Xarelto ONCE DAILY and you miss a dose, take it as soon as you remember on the same day then continue your regularly scheduled once daily regimen the next day. Do not take two doses of Xarelto at the same time or on the same day.   Important Safety Information A possible side effect of Xarelto is bleeding. You should call your healthcare provider right away if you experience any of the following: ? Bleeding from an injury or your nose that does not stop. ? Unusual colored urine (red or dark brown) or unusual colored stools (red or  black). ? Unusual bruising for unknown reasons. ? A serious fall or if you hit your head (even if there is no bleeding).  Some medicines may interact with Xarelto and might increase your risk of bleeding while on Xarelto. To help avoid this, consult your healthcare provider or pharmacist prior to using any new prescription or non-prescription medications, including herbals, vitamins, non-steroidal anti-inflammatory drugs (NSAIDs) and supplements.  This website has more information on Xarelto: [email or web address]  Atrial Fibrillation Atrial fibrillation is a type of irregular heart rhythm (arrhythmia). During atrial fibrillation, the upper chambers of the heart (atria) quiver continuously in a chaotic pattern. This causes an irregular and often rapid heart rate.  Atrial fibrillation is the result of the heart becoming overloaded with disorganized signals that tell it to beat. These signals are normally released one at a time by a part of the right atrium called the sinoatrial node. They then travel from the atria to the lower chambers of the heart (ventricles), causing the atria and ventricles to contract and pump blood as they pass. In atrial fibrillation, parts of the atria outside of the sinoatrial node also release these signals. This results in two problems. First, the atria receive so many signals that they do not have time to fully contract. Second, the ventricles, which can only receive one signal at a time, beat irregularly and out of rhythm with the atria.  There are three types of atrial fibrillation:   Paroxysmal. Paroxysmal atrial fibrillation starts suddenly and stops on its  own within a week.  Persistent. Persistent atrial fibrillation lasts for more than a week. It may stop on its own or with treatment.  Permanent. Permanent atrial fibrillation does not go away. Episodes of atrial fibrillation may lead to permanent atrial fibrillation. Atrial fibrillation can prevent your heart from  pumping blood normally. It increases your risk of stroke and can lead to heart failure.  CAUSES   Heart conditions, including a heart attack, heart failure, coronary artery disease, and heart valve conditions.   Inflammation of the sac that surrounds the heart (pericarditis).  Blockage of an artery in the lungs (pulmonary embolism).  Pneumonia or other infections.  Chronic lung disease.  Thyroid problems, especially if the thyroid is overactive (hyperthyroidism).  Caffeine, excessive alcohol use, and use of some illegal drugs.   Use of some medicines, including certain decongestants and diet pills.  Heart surgery.   Birth defects.  Sometimes, no cause can be found. When this happens, the atrial fibrillation is called lone atrial fibrillation. The risk of complications from atrial fibrillation increases if you have lone atrial fibrillation and you are age 75 years or older. RISK FACTORS  Heart failure.  Coronary artery disease.  Diabetes mellitus.   High blood pressure (hypertension).   Obesity.   Other arrhythmias.   Increased age. SIGNS AND SYMPTOMS   A feeling that your heart is beating rapidly or irregularly.   A feeling of discomfort or pain in your chest.   Shortness of breath.   Sudden light-headedness or weakness.   Getting tired easily when exercising.   Urinating more often than normal (mainly when atrial fibrillation first begins).  In paroxysmal atrial fibrillation, symptoms may start and suddenly stop. DIAGNOSIS  Your health care provider may be able to detect atrial fibrillation when taking your pulse. Your health care provider may have you take a test called an ambulatory electrocardiogram (ECG). An ECG records your heartbeat patterns over a 24-hour period. You may also have other tests, such as:  Transthoracic echocardiogram (TTE). During echocardiography, sound waves are used to evaluate how blood flows through your  heart.  Transesophageal echocardiogram (TEE).  Stress test. There is more than one type of stress test. If a stress test is needed, ask your health care provider about which type is best for you.  Chest X-ray exam.  Blood tests.  Computed tomography (CT). TREATMENT  Treatment may include:  Treating any underlying conditions. For example, if you have an overactive thyroid, treating the condition may correct atrial fibrillation.  Taking medicine. Medicines may be given to control a rapid heart rate or to prevent blood clots, heart failure, or a stroke.  Having a procedure to correct the rhythm of the heart:  Electrical cardioversion. During electrical cardioversion, a controlled, low-energy shock is delivered to the heart through your skin. If you have chest pain, very low blood pressure, or sudden heart failure, this procedure may need to be done as an emergency.  Catheter ablation. During this procedure, heart tissues that send the signals that cause atrial fibrillation are destroyed.  Surgical ablation. During this surgery, thin lines of heart tissue that carry the abnormal signals are destroyed. This procedure can either be an open-heart surgery or a minimally invasive surgery. With the minimally invasive surgery, small cuts are made to access the heart instead of a large opening.  Pulmonary venous isolation. During this surgery, tissue around the veins that carry blood from the lungs (pulmonary veins) is destroyed. This tissue is thought to carry  the abnormal signals. HOME CARE INSTRUCTIONS   Take medicines only as directed by your health care provider. Some medicines can make atrial fibrillation worse or recur.  If blood thinners were prescribed by your health care provider, take them exactly as directed. Too much blood-thinning medicine can cause bleeding. If you take too little, you will not have the needed protection against stroke and other problems.  Perform blood tests at  home if directed by your health care provider. Perform blood tests exactly as directed.  Quit smoking if you smoke.  Do not drink alcohol.  Do not drink caffeinated beverages such as coffee, soda, and some teas. You may drink decaffeinated coffee, soda, or tea.   Maintain a healthy weight.Do not use diet pills unless your health care provider approves. They may make heart problems worse.   Follow diet instructions as directed by your health care provider.  Exercise regularly as directed by your health care provider.  Keep all follow-up visits as directed by your health care provider. This is important. PREVENTION  The following substances can cause atrial fibrillation to recur:   Caffeinated beverages.  Alcohol.  Certain medicines, especially those used for breathing problems.  Certain herbs and herbal medicines, such as those containing ephedra or ginseng.  Illegal drugs, such as cocaine and amphetamines. Sometimes medicines are given to prevent atrial fibrillation from recurring. Proper treatment of any underlying condition is also important in helping prevent recurrence.  SEEK MEDICAL CARE IF:  You notice a change in the rate, rhythm, or strength of your heartbeat.  You suddenly begin urinating more frequently.  You tire more easily when exerting yourself or exercising. SEEK IMMEDIATE MEDICAL CARE IF:   You have chest pain, abdominal pain, sweating, or weakness.  You feel nauseous.  You have shortness of breath.  You suddenly have swollen feet and ankles.  You feel dizzy.  Your face or limbs feel numb or weak.  You have a change in your vision or speech. MAKE SURE YOU:   Understand these instructions.  Will watch your condition.  Will get help right away if you are not doing well or get worse. Document Released: 12/20/2004 Document Revised: 05/06/2013 Document Reviewed: 01/31/2012 [facility] Patient Information 2015 [place], [place]. This information is  not intended to replace advice given to you by your health care provider. Make sure you discuss any questions you have with your health care provider.

## 2014-05-04 NOTE — Progress Notes (Signed)
Patient arrived from ED. Alert and oriented X4. SR on telemetry. NO complaints of dizziness or pain. No skin breakdown. VS stable. Pt from home with husband.

## 2014-05-05 ENCOUNTER — Observation Stay (HOSPITAL_COMMUNITY): Payer: Medicare Other

## 2014-05-05 DIAGNOSIS — J441 Chronic obstructive pulmonary disease with (acute) exacerbation: Secondary | ICD-10-CM

## 2014-05-05 DIAGNOSIS — I4891 Unspecified atrial fibrillation: Secondary | ICD-10-CM | POA: Diagnosis not present

## 2014-05-05 DIAGNOSIS — E039 Hypothyroidism, unspecified: Secondary | ICD-10-CM | POA: Diagnosis not present

## 2014-05-05 DIAGNOSIS — Z720 Tobacco use: Secondary | ICD-10-CM | POA: Diagnosis not present

## 2014-05-05 LAB — BASIC METABOLIC PANEL
Anion gap: 9 (ref 5–15)
BUN: 10 mg/dL (ref 6–20)
CO2: 25 mmol/L (ref 22–32)
Calcium: 9.5 mg/dL (ref 8.9–10.3)
Chloride: 107 mmol/L (ref 101–111)
Creatinine, Ser: 0.83 mg/dL (ref 0.44–1.00)
GFR calc Af Amer: >60 mL/min (ref >60)
GFR calc non Af Amer: >60 mL/min (ref >60)
Glucose, Bld: 97 mg/dL (ref 70–99)
Potassium: 3.9 mmol/L (ref 3.5–5.1)
Sodium: 141 mmol/L (ref 135–145)

## 2014-05-05 LAB — MAGNESIUM: Magnesium: 1.9 mg/dL (ref 1.7–2.4)

## 2014-05-05 MED ORDER — METOPROLOL TARTRATE 25 MG PO TABS: 37.5000 mg | ORAL_TABLET | Freq: Two times a day (BID) | ORAL | Status: DC

## 2014-05-05 MED ORDER — OFF THE BEAT BOOK
Freq: Once | Status: AC
  Administered 2014-05-05: 13:00:00
  Filled 2014-05-05: qty 1

## 2014-05-05 MED ORDER — ALBUTEROL SULFATE HFA 108 (90 BASE) MCG/ACT IN AERS: 1.0000 | INHALATION_SPRAY | Freq: Four times a day (QID) | RESPIRATORY_TRACT | Status: DC | PRN

## 2014-05-05 MED ORDER — RIVAROXABAN 20 MG PO TABS: 20.0000 mg | ORAL_TABLET | Freq: Every day | ORAL | Status: DC

## 2014-05-05 MED ORDER — HYOSCYAMINE SULFATE 0.125 MG PO TBDP
0.1250 mg | ORAL_TABLET | Freq: Two times a day (BID) | ORAL | Status: DC
  Administered 2014-05-05: 0.125 mg via SUBLINGUAL
  Filled 2014-05-05 (×2): qty 1

## 2014-05-05 MED ORDER — DOXYCYCLINE HYCLATE 100 MG PO TABS: 100.0000 mg | ORAL_TABLET | Freq: Two times a day (BID) | ORAL | Status: DC

## 2014-05-05 MED ORDER — RIVAROXABAN 20 MG PO TABS
20.0000 mg | ORAL_TABLET | Freq: Every day | ORAL | Status: DC
  Filled 2014-05-05: qty 1

## 2014-05-05 MED ORDER — NICOTINE 7 MG/24HR TD PT24: 7.0000 mg | MEDICATED_PATCH | Freq: Every day | TRANSDERMAL | Status: DC

## 2014-05-05 NOTE — Progress Notes (Signed)
Patient Name: Dominique Barber Date of Encounter: 05/05/2014  Primary cardiologist: Dr Glenetta Hew MD   Principal Problem:   Atrial fibrillation with rapid ventricular response Active Problems:   Hypothyroid   ST elevation (STEMI) myocardial infarction involving left anterior descending coronary artery   CAD- 100% LAD, s/p SVG-LAD after failed LIMA-LAD   S/P CABG x 2   Hyperlipidemia with target LDL less than 70   Tobacco abuse   COPD (chronic obstructive pulmonary disease)    SUBJECTIVE  Denies any CP or SOB. Feels good.   CURRENT MEDS . acidophilus  1 capsule Oral Daily  . calcium carbonate  1,250 mg Oral Q breakfast  . doxycycline  100 mg Oral Q12H  . hyoscyamine  0.125 mg Sublingual BID  . levothyroxine  100 mcg Oral QAC breakfast  . metoprolol tartrate  37.5 mg Oral BID  . nicotine  7 mg Transdermal Daily  . potassium chloride  10 mEq Oral BID  . rivaroxaban  20 mg Oral Q supper  . sodium chloride  3 mL Intravenous Q12H  . sodium chloride  3 mL Intravenous Q12H    OBJECTIVE  Filed Vitals:   05/05/14 0114 05/05/14 0705 05/05/14 0711 05/05/14 0938  BP: 120/73 127/60  125/71  Pulse: 73 64  62  Temp: 98.5 F (36.9 C) 97.9 F (36.6 C)    TempSrc: Oral Oral    Resp: 18 16    Height:      Weight:   107 lb 3.2 oz (48.626 kg)   SpO2: 97% 96%      Intake/Output Summary (Last 24 hours) at 05/05/14 1010 Last data filed at 05/05/14 1004  Gross per 24 hour  Intake   1440 ml  Output    900 ml  Net    540 ml   Filed Weights   05/03/14 2227 05/04/14 0315 05/05/14 0711  Weight: 115 lb (52.164 kg) 106 lb 8 oz (48.308 kg) 107 lb 3.2 oz (48.626 kg)    PHYSICAL EXAM  General: Pleasant, NAD. Neuro: Alert and oriented X 3. Moves all extremities spontaneously. Psych: Normal affect. HEENT:  Normal  Neck: Supple without bruits or JVD. Lungs:  Resp regular and unlabored, CTA. Heart: RRR no s3, s4, or murmurs. Abdomen: Soft, non-tender, non-distended, BS + x 4.    Extremities: No clubbing, cyanosis or edema. DP/PT/Radials 2+ and equal bilaterally.  Accessory Clinical Findings  CBC  Recent Labs  05/03/14 2317 05/03/14 2342 05/04/14 0555  WBC 8.4  --  7.5  NEUTROABS 4.6  --   --   HGB 13.1 13.6 11.9*  HCT 37.8 40.0 35.2*  MCV 93.8  --  95.4  PLT 239  --  188   Basic Metabolic Panel  Recent Labs  05/04/14 0555 05/04/14 2100 05/05/14 0500  NA 141  --  141  K 3.6  --  3.9  CL 108  --  107  CO2 24  --  25  GLUCOSE 159*  --  97  BUN <5*  --  10  CREATININE 1.09*  --  0.83  CALCIUM 9.3  --  9.5  MG  --  1.8 1.9   Thyroid Function Tests  Recent Labs  05/04/14 0555  TSH 1.165    TELE NSR with HR 50-80s    ECG  No new EKG  Echocardiogram  pending    Radiology/Studies  Dg Chest Port 1 View  05/04/2014   CLINICAL DATA:  Cough. History of myocardial infarction  and cardiac surgery. Initial encounter.  EXAM: PORTABLE CHEST - 1 VIEW  COMPARISON:  03/29/2011 and 10/20/2006.  FINDINGS: 1619 hours. The heart size and mediastinal contours are stable status post median sternotomy and CABG. There is stable mild biapical scarring. The lungs are otherwise clear without edema, airspace disease or pleural effusion. The bones appear unchanged.  IMPRESSION: Stable postoperative chest.  No active cardiopulmonary process.   Electronically Signed   By: Richardean Sale M.D.   On: 05/04/2014 16:38    ASSESSMENT AND PLAN  59 yo female with h/o CAD (prior anterior STEMI in 01/1996 with VF arrest and anoxic brain injury s/p POBA to LAD), s/p CABG in 1998, HLD, COPD presented on the night of 05/03/2014 with palpitations and SOB and found to be in a-fib with RVR which is new for her. IV cardizem and xarelto started.   1. New onset a-fib - converted on dilt gtt  - CHADS2Vasc score is 2 (CAD, gender)  - TSH normal.  - switched to metoprolol 37.5mg  BID. Pending echo today, no sign of HF on exam (CXR normal, lung CTA, on doxycline for potential  bronchitis although has been afebrile), if echo unable to be done today, patient can be discharged and have it done as outpatient. She has a previously arranged followup with Dr. Ellyn Hack next week.   2. CAD s/p CABG (h/o VF arrest and anoxic brain injury)  3. Hypothyroidism  Signed, Almyra Deforest PA-C Pager: [phone]   Patient examined chart reviewed.  Maint. NSR with no symptoms  Continue xarelto and beta blocker Outpatient f.u with Dr Robyne Askew

## 2014-05-05 NOTE — Progress Notes (Signed)
Return call received from Dr. Algis Liming instructing that is ok for pt to be d/c home.  Karie Kirks, Therapist, sports.

## 2014-05-05 NOTE — Discharge Summary (Addendum)
Physician Discharge Summary  Dominique Barber VOJ:[id number] DOB: 11/20/1955 DOA: 05/03/2014  PCP: Elayne Snare, MD  Admit date: 05/03/2014 Discharge date: 05/05/2014  Time spent: Less than 30 minutes  Recommendations for Outpatient Follow-up:  1. Dr. Glenetta Hew, Cardiology: Keep prior appointment on 05/12/14 at 4 PM. To be seen with repeat labs (CBC & BMP). Will need 2-D echo as outpatient. 2. Dr. Elayne Snare, PCP in 1 week.  Discharge Diagnoses:  Principal Problem:   Atrial fibrillation with rapid ventricular response Active Problems:   Hypothyroid   ST elevation (STEMI) myocardial infarction involving left anterior descending coronary artery   CAD- 100% LAD, s/p SVG-LAD after failed LIMA-LAD   S/P CABG x 2   Hyperlipidemia with target LDL less than 70   Tobacco abuse   COPD (chronic obstructive pulmonary disease)   Discharge Condition: Improved & Stable  Diet recommendation: Heart healthy diet  Filed Weights   05/03/14 2227 05/04/14 0315 05/05/14 0711  Weight: 52.164 kg (115 lb) 48.308 kg (106 lb 8 oz) 48.626 kg (107 lb 3.2 oz)    History of present illness:  59 y.o.female hx of CAD with prior anterior STEMI Jan 1998 with VF arrest and anoxic brain injury s/p POBA to LAD, complex further CAD history including CABG in 1998, HL, COPD, hypothyroid, IBS admitted with palpitations and SOB. Found to be in afib with RVR on admit, a new diagnosis. Cards consulted.  Hospital Course:   [name] with RVR - EMS found her heart rate in the 160s consistent with [name] with RVR. - She was administered IV Cardizem bolus and placed on Cardizem drip - She converted to sinus rhythm. - Cardiology was consulted and increased metoprolol to 37.5 MG BID - CHADS2Vasc score is 2 (CAD, gender), she is appropriately on xarelto 20mg  daily-started new. Stopped ASA - TSH normal. - Cardiology has evaluated today and cleared her for discharge. They recommend obtaining 2-D echo either inpatient or  outpatient. Patient however insisting on going home and does not want to wait for 2-D echo.  CAD s/p CABG (history of V. fib arrest and anoxic brain injury) - Stable  Hypothyroid - TSH normal - Continue levothyroxine  COPD with mild exacerbation - Tobacco cessation counseled - Complete 5 days of doxycycline started for presumed acute bronchitis and mild COPD exacerbation. - Chest x-ray without acute findings - No clinical bronchospasm and hence did not start on steroids - PRN albuterol inhaler  Tobacco abuse - Cessation counseled - Started nicotine patch  Hypokalemia - Replaced  Mild anemia - No reported bleeding - Follow CBC as outpatient    Consultations:  Cardiology  Procedures:  None    Discharge Exam:  Complaints:  Patient denies complaints. Denies palpitations, chest pain, dyspnea, dizziness or light headedness. Insisting on going home and does not want to wait for 2-D echo. Has follow-up appointment with cardiology for next week.  Filed Vitals:   05/05/14 0114 05/05/14 0705 05/05/14 0711 05/05/14 0938  BP: 120/73 127/60  125/71  Pulse: 73 64  62  Temp: 98.5 F (36.9 C) 97.9 F (36.6 C)    TempSrc: Oral Oral    Resp: 18 16    Height:      Weight:   48.626 kg (107 lb 3.2 oz)   SpO2: 97% 96%      General exam: Pleasant middle-aged female sitting up comfortably in bed. Respiratory system: Clear. No increased work of breathing. Cardiovascular system: S1 & S2 heard, RRR. No JVD, murmurs, gallops,  clicks or pedal edema. Telemetry: Sinus bradycardia occasionally in the high 40s-50s but otherwise in sinus rhythm in the 60s. Gastrointestinal system: Abdomen is nondistended, soft and nontender. Normal bowel sounds heard. Central nervous system: Alert and oriented. No focal neurological deficits. Extremities: Symmetric 5 x 5 power.  Discharge Instructions      Discharge Instructions    Call MD for:  difficulty breathing, headache or visual disturbances     Complete by:  As directed      Call MD for:  extreme fatigue    Complete by:  As directed      Call MD for:  persistant dizziness or light-headedness    Complete by:  As directed      Call MD for:  temperature >100.4    Complete by:  As directed      Call MD for:    Complete by:  As directed   Heart racing/Palpitations.     Diet - low sodium heart healthy    Complete by:  As directed      Increase activity slowly    Complete by:  As directed             Medication List    STOP taking these medications        aspirin EC 81 MG tablet     BUPROPION HBR ER PO      TAKE these medications        albuterol 108 (90 BASE) MCG/ACT inhaler  Commonly known as:  PROVENTIL HFA;VENTOLIN HFA  Inhale 1-2 puffs into the lungs every 6 (six) hours as needed for wheezing or shortness of breath.     ALIGN PO  Take by mouth daily.     CALCIUM PO  Take 1 tablet by mouth daily.     cetirizine 10 MG tablet  Commonly known as:  ZYRTEC  Take 1 tablet (10 mg total) by mouth daily.     doxycycline 100 MG tablet  Commonly known as:  VIBRA-TABS  Take 1 tablet (100 mg total) by mouth every 12 (twelve) hours.     hyoscyamine 0.125 MG tablet  Commonly known as:  LEVSIN, ANASPAZ  Take 0.125 mg by mouth 2 (two) times daily.     INTEGRA PO  Take 1 tablet by mouth daily.     metoprolol tartrate 25 MG tablet  Commonly known as:  LOPRESSOR  Take 1.5 tablets (37.5 mg total) by mouth 2 (two) times daily.     nicotine 7 mg/24hr patch  Commonly known as:  NICODERM CQ - dosed in mg/24 hr  Place 1 patch (7 mg total) onto the skin daily.     potassium chloride 10 MEQ tablet  Commonly known as:  K-DUR,KLOR-CON  Take 1 tablet (10 mEq total) by mouth 2 (two) times daily.     rivaroxaban 20 MG Tabs tablet  Commonly known as:  XARELTO  Take 1 tablet (20 mg total) by mouth daily with supper.     SYNTHROID 100 MCG tablet  Generic drug:  levothyroxine  TAKE 1 TABLET EVERY MORNING ON AN EMPTY  STOMACH     TUSSIN PO  Take 30 mLs by mouth every 4 (four) hours as needed. For cough       Follow-up Information    Follow up with Leonie Man, MD On 05/12/2014.   Specialty:  Cardiology   Why:  At 4 PM. To be seen with repeat labs (CBC & BMP). Echo to be done as out  patient.   Contact information:   [address] [place] [address] [phone]        The results of significant diagnostics from this hospitalization (including imaging, microbiology, ancillary and laboratory) are listed below for reference.    Significant Diagnostic Studies: Dg Chest Port 1 View  05/04/2014   CLINICAL DATA:  Cough. History of myocardial infarction and cardiac surgery. Initial encounter.  EXAM: PORTABLE CHEST - 1 VIEW  COMPARISON:  03/29/2011 and 10/20/2006.  FINDINGS: 1619 hours. The heart size and mediastinal contours are stable status post median sternotomy and CABG. There is stable mild biapical scarring. The lungs are otherwise clear without edema, airspace disease or pleural effusion. The bones appear unchanged.  IMPRESSION: Stable postoperative chest.  No active cardiopulmonary process.   Electronically Signed   By: Richardean Sale M.D.   On: 05/04/2014 16:38    Microbiology: No results found for this or any previous visit (from the past 240 hour(s)).   Labs: Basic Metabolic Panel:  Recent Labs Lab 05/03/14 2342 05/04/14 0555 05/04/14 2100 05/05/14 0500  NA 143 141  --  141  K 3.4* 3.6  --  3.9  CL 106 108  --  107  CO2  --  24  --  25  GLUCOSE 104* 159*  --  97  BUN 4* <5*  --  10  CREATININE 0.70 1.09*  --  0.83  CALCIUM  --  9.3  --  9.5  MG  --   --  1.8 1.9   Liver Function Tests: No results for input(s): AST, ALT, ALKPHOS, BILITOT, PROT, ALBUMIN in the last 168 hours. No results for input(s): LIPASE, AMYLASE in the last 168 hours. No results for input(s): AMMONIA in the last 168 hours. CBC:  Recent Labs Lab 05/03/14 2317 05/03/14 2342  05/04/14 0555  WBC 8.4  --  7.5  NEUTROABS 4.6  --   --   HGB 13.1 13.6 11.9*  HCT 37.8 40.0 35.2*  MCV 93.8  --  95.4  PLT 239  --  225   Cardiac Enzymes: No results for input(s): CKTOTAL, CKMB, CKMBINDEX, TROPONINI in the last 168 hours. BNP: BNP (last 3 results) No results for input(s): BNP in the last 8760 hours.  ProBNP (last 3 results) No results for input(s): PROBNP in the last 8760 hours.  CBG: No results for input(s): GLUCAP in the last 168 hours.   Additional labs: 1. TSH: 1.165   Signed:  Vernell Leep, MD, FACP, FHM. [facility] Pager [phone]  If 7PM-7AM, please contact night-coverage [email or web address] Password [facility] 05/05/2014, 12:33 PM

## 2014-05-05 NOTE — Progress Notes (Signed)
Pt requesting to know if she can have echo done as out pt as she was told that by cardiology.  Notified Dr Algis Liming, and he instructed that he will be d/c soon after his rounds.  Pt made aware and verbalized  Understanding.  Karie Kirks, Therapist, sports.

## 2014-05-05 NOTE — Care Management Note (Signed)
Case Management Note  Patient Details  Name: Dominique Barber MRN: [id number] Date of Birth: [birth year]  Subjective/Objective:      Pt adm on 05/03/14 with Atrial fibrillation.  PTA, pt resides at home with spouse, and is independent.                  Action/Plan:  CM referral for new Xarelto.  Pt given 30 day free trial card for Xarelto.  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  She gets all medications free with her drug plan/ Veteran's benefit.    Expected Discharge Date:  05/05/14               Expected Discharge Plan:  Home/Self Care  In-House Referral:  NA  Discharge planning Services  CM Consult, Medication Assistance  Post Acute Care Choice:    Choice offered to:     DME Arranged:    DME Agency:     HH Arranged:    HH Agency:     Status of Service:  Completed, signed off  Medicare Important Message Given:  No Date Medicare IM Given:    Medicare IM give by:    Date Additional Medicare IM Given:    Additional Medicare Important Message give by:     If discussed at [place] of Stay Meetings, dates discussed:    Additional Comments:  Ella Bodo, RN 05/05/2014, 2:01 PM

## 2014-05-05 NOTE — Progress Notes (Signed)
Called Echo lab to inform them that pt is to be d/c to do and want echo done prior to d/c as instructed by Dr Algis Liming.  Instructed by Candice in echo lab that they will get to her as soon as they can.   Ceniya Fowers,RN.

## 2014-05-12 ENCOUNTER — Ambulatory Visit (INDEPENDENT_AMBULATORY_CARE_PROVIDER_SITE_OTHER): Payer: Medicare Other | Admitting: Cardiology

## 2014-05-12 ENCOUNTER — Encounter: Payer: Self-pay | Admitting: Cardiology

## 2014-05-12 VITALS — BP 122/80 | HR 52 | Ht 62.0 in | Wt 110.6 lb

## 2014-05-12 DIAGNOSIS — I2510 Atherosclerotic heart disease of native coronary artery without angina pectoris: Secondary | ICD-10-CM

## 2014-05-12 DIAGNOSIS — Z951 Presence of aortocoronary bypass graft: Secondary | ICD-10-CM

## 2014-05-12 DIAGNOSIS — I2102 ST elevation (STEMI) myocardial infarction involving left anterior descending coronary artery: Secondary | ICD-10-CM | POA: Diagnosis not present

## 2014-05-12 DIAGNOSIS — I4891 Unspecified atrial fibrillation: Secondary | ICD-10-CM

## 2014-05-12 DIAGNOSIS — E785 Hyperlipidemia, unspecified: Secondary | ICD-10-CM

## 2014-05-12 DIAGNOSIS — Z720 Tobacco use: Secondary | ICD-10-CM

## 2014-05-12 MED ORDER — RIVAROXABAN 20 MG PO TABS: 20.0000 mg | ORAL_TABLET | Freq: Every day | ORAL | Status: DC

## 2014-05-12 NOTE — Progress Notes (Signed)
PCP: Elayne Snare, MD  Clinic Note: Chief Complaint  Patient presents with  . Shortness of Breath    patient experiences this occasionally at rest  . Dizziness    comes and goes whenever  . Hospitalization Follow-up    former [facility] patient   HPI: Dominique Barber is a 59 y.o. female with a PMH below who presents today for six-month followup of CAD with history of CABG.    This is also a post-Hospital f/u for recent Dx of Afib (PAF) with RVR. - started with the GI issues of unable to burp or pass gas ~4.5 hrs - then noted heart skipping, then fast -- then It felt like it would not stop. She was evaluated by EMS and found to be in [name] RVR 160 beats a minute. Initially treated with IV diltiazem and started on Zaroxolyn. She spontaneously converted to sinus rhythm and was discharged prior to have an echocardiogram performed.  She was also treated with a five-day course of doxycycline for possible COPD exacerbation/bronchitis.  Past Cardiac History:  Anterior STEMI 7096: complicated by VF arrest and mild anoxic brain injury following decompensation/shock  She was finally treated with a angioplasty of the LAD at that time.   Shortly thereafter she underwent CABG with the LIMA-LAD and SVG-D1. -   She then had a Complication when the LIMA graft and suffered another anterior MI and underwent emergent SVG-LAD bypass.   Her last cardiac catheterization was in 2005 with no significant change in disease with patent grafts.. By her report, since her CABG she has been troubled with IBS. She has always had a strange sensation in her chest/upper gastric region related to her irritable bowel syndrome that she developed shortly after her second bypass surgery. She has intermittent episodes of diarrhea and then when she has her mild constipation symptoms, it but she notes mostly is extreme bloating in the epigastric region where she feels as though she can't burp and can't move her bowels.  She  feels with this by taking oral cholestyramine liquid which helps alleviate the symptoms.  She is quite certain that these episodes are not her anginal equivalent.  Past Medical History  Diagnosis Date  . ST elevation (STEMI) myocardial infarction involving left anterior descending coronary artery  01/19/1996    Calculated by VF arrest, and anoxic brain injury with status epilepticus; POBA of LAD  . CAD in native artery 1998    Followup 1 month post MI revealed 95% ISR in PTCA site --> PCI with 3.0 mm x 25 mm BMS stent in proximal LAD --> 5 months later recurrent ISR of LAD BMS that compromised D1 --> referred for CABG  . S/P CABG x 04 July 1996    Initially LIMA-LAD, SVG-D1 --> immediate LIMA failure post CABG with anterior MI --> urgent redo SVG-LAD beyond D2.; Widely patent grafts as of 2005 with left dominant system. Graft to the diagonal branch has a very small target vessel but the vein graft to LAD retrograde fills a large bifurcating D2 and antegrade fills a large D3.  Marland Kitchen Hyperlipidemia   . COPD (chronic obstructive pulmonary disease)   . Tobacco abuse   . Irritable bowel syndrome     Followed by Dr. Juanita Craver.  Marland Kitchen GERD (gastroesophageal reflux disease)   . Hypothyroidism     On Synthroid  . Iron deficiency anemia   . PAF (paroxysmal atrial fibrillation) 05/03/2014    New onset  -- has had GI issues of  bloating & IBS Sx that started after CABG, now has difficulty burping.  Prior Cardiac Evaluation and Past Surgical History: Past Surgical History  Procedure Laterality Date  . Appendectomy    . Cesarean section    . Cholecystectomy    . Coronary angioplasty  01/19/1996    acute anterior wall MI, anoxic encephalopahty, VF; LCfx free of disease and dominant, RCA patent, L main short and patent, LAD with 70-80% narrowing and focal 95% stenosis in distal 3rd with balloon angioplasty (Dr. Marella Chimes)  . Coronary angioplasty with stent placement  02/26/1996    3.0x25mm Multi-Link stent  to prox/mid LAD; L main normal & short, Cfx dominant and normal, PDA & PLA normal, RCA non-dominant with small PLA and normal (Dr. Marella Chimes)  . Cardiac catheterization  07/30/1996    normal L main, LAD w/95% stenosis just before stent, diffuse 80-85% end-stent restenosis, 1st diagonal with70-75% ostial stenosis, large optional diagonal that was normal, Cfx with 2 marginals and distal PLA (all normal), RCA normal (Dr. Marella Chimes)  . Cardiac catheterization  01/31/2003    LAD totally occluded in prox 3rd, patent Cfx, SVG to mid LAd patent, SVG to small DX1 patent, LIMA to LAD was atretic and essentially occluded in junction of prox & mid 3rd, R barciocephalic and subclavian were normal (Dr. Marella Chimes)  . Coronary artery bypass graft  07/31/1996    x2; LIMA to LAD and SVG to 1st diagonal (Dr. Remus Loffler)  . Coronary artery bypass graft  07/31/1996    x1; SVG to distal LAD (Dr. Remus Loffler)  . Nm myocar perf wall motion  10/2010    LexiScan Cardiolite - mild perfusion defect r/t infarct/scar with mild periifarct ischemia in mid anterior & apical anterior region; EF 67%; abnormal, low risk study  . Transthoracic echocardiogram  02/2012    EF 50-55%, mild hypokinesis of anterospetal myocardium; mild MR    Interval History: Recent Inpt for Afib RVR - BB dose increase & on Xarelto. She had been doing great well from a cardiac standpoint until this hospitalization at the end of April. She's never had prolonged rapid heart beats like that but has had a history of palpitations for a while. She said that the whole episode started with her usual IBS related GI symptom of difficulty burping and bloating. She then started feeling very lightheaded and dizzy almost as if she would pass out but did not pass out. Her heart was racing. Since her discharge she has not noted any more the rapid heart racing, but has noted some palpitations and fluttering off and on. When this happens and when she was going fast she did not  dyspnea but no chest pain just discomfort from going fast. She has had about 2 or 3 episodes of less than 30 minutes of the preceding symptoms of burping and bloating symptoms but did not have rapid heartbeats. Otherwise really she's not been overly symptomaticher cardiac standpoint. She has occasional episodes of resting dyspnea and intermittent dizziness but not a routine episode.  She has not had any MI related angina with rest or exertion. She may get a bit short of breath if she tries to walk, but she really doesn't get too far away from the house because of her GI issues. She does yard work and a lot of walking around the house but not too far from the house. Since discharge, no symptoms of syncope/near syncope, orTIA/amaurosis fugax. She denies any heart failure symptoms of PE, orthopnea  or edema. Up until her [name] episode her palpitations are well controlled with low dose of metoprolol.  ROS: A comprehensive was performed. Review of Systems  Constitutional: Negative for fever and chills.  HENT: Negative for nosebleeds.   Respiratory: Positive for cough (intermittent. Recovering from a COPD exacerbation/bronchitis) and shortness of breath (No change from baseline. She did notice it when she was in [name] RVR). Negative for hemoptysis and sputum production.   Gastrointestinal: Positive for heartburn, nausea and abdominal pain (Bloating, belching and difficulty burping). Negative for blood in stool and melena.  Genitourinary: Negative for hematuria.  Neurological: Positive for dizziness and weakness (Generalized).  Endo/Heme/Allergies: Does not bruise/bleed easily.  All other systems reviewed and are negative.   Current Outpatient Prescriptions on File Prior to Visit  Medication Sig Dispense Refill  . albuterol (PROVENTIL HFA;VENTOLIN HFA) 108 (90 BASE) MCG/ACT inhaler Inhale 1-2 puffs into the lungs every 6 (six) hours as needed for wheezing or shortness of breath. 1 Inhaler 0  .  CALCIUM PO Take 1 tablet by mouth daily.     . cetirizine (ZYRTEC) 10 MG tablet Take 1 tablet (10 mg total) by mouth daily. 90 tablet 1  . Fe Fum-FePoly-Vit C-Vit B3 (INTEGRA PO) Take 1 tablet by mouth daily.     . GuaiFENesin (TUSSIN PO) Take 30 mLs by mouth every 4 (four) hours as needed. For cough    . hyoscyamine (LEVSIN, ANASPAZ) 0.125 MG tablet Take 0.125 mg by mouth 2 (two) times daily.     . metoprolol tartrate (LOPRESSOR) 25 MG tablet Take 1.5 tablets (37.5 mg total) by mouth 2 (two) times daily. 90 tablet 0  . potassium chloride (K-DUR,KLOR-CON) 10 MEQ tablet Take 1 tablet (10 mEq total) by mouth 2 (two) times daily. (Patient taking differently: Take 10 mEq by mouth once. ) 90 tablet 3  . Probiotic Product (ALIGN PO) Take by mouth daily.    Marland Kitchen SYNTHROID 100 MCG tablet TAKE 1 TABLET EVERY MORNING ON AN EMPTY STOMACH 90 tablet 2   No current facility-administered medications on file prior to visit.   Allergies  Allergen Reactions  . Iohexol Hives and Rash    Red dye   History  Substance Use Topics  . Smoking status: Current Every Day Smoker -- 0.25 packs/day    Types: Cigarettes  . Smokeless tobacco: Current User     Comment: Quit at time of MI, restarted in 2009  . Alcohol Use: No   History   Social History Narrative   Married, mother of 3 children (2 living sons age 65-26 and 30-31; her daughter was the victim of a murder that took place sometime around the time of the patient's MI. She was under significant amount of stress as her daughter had gone missing. She was never found. Finally the suspect was charged and convicted in 2009. She had sessile he stop smoking until that time frame when it brought back memories and she is now back to smoking a half pack a day.   She does not get routine exercise, but is active around the house and doing housecleaning chores.   She does not drink alcohol.   Family History  Problem Relation Age of Onset  . Heart failure Other   .  Diabetes Other   . Diabetes Mother   . Thyroid disease Paternal Aunt   . Thyroid disease Maternal Grandmother     Wt Readings from Last 3 Encounters:  05/12/14 50.168 kg (110 lb 9.6 oz)  12/04/13 51.166 kg (112 lb 12.8 oz)  11/12/13 52.254 kg (115 lb 3.2 oz)    PHYSICAL EXAM BP 122/80 mmHg  Pulse 52  Ht 5\' 2"  (1.575 m)  Wt 50.168 kg (110 lb 9.6 oz)  BMI 20.22 kg/m2 General appearance: alert, cooperative, appears stated age, no distress, mildly obese and  Psych: Anxious appearing, but answers questions properly. Somewhat subdued affect Neck: no adenopathy, no carotid bruit, no JVD and supple, symmetrical, trachea midline; Small palpable nodule noted in the left thyroid gland. Lungs: clear to auscultation bilaterally, normal percussion bilaterally and Nonlabored, good air movement with mild interstitial sounds Heart: RRR with normal S1 and S2. Nondisplaced PMI. Very early peaking 1-2/6 SEM at [place]. No other M./R./G. Abdomen: soft, non-tender; bowel sounds normal; no masses, no organomegaly Extremities: extremities normal, atraumatic, no cyanosis or edema Pulses: 2+ and symmetric Skin: Skin color, texture, turgor normal. No rashes or lesions or Well-healed sternotomy scar and venous graft section sites. Neurologic: Alert and oriented X 3, normal strength and tone. Normal symmetric reflexes. Normal coordination and gait CN II-12 grossly intact   Adult ECG Report  Rate: 52;  Rhythm: sinus bradycardia, possible left atrial enlargement, poor R-wave progression across the precordial leads, suggestive of possible anterior infarct, age undetermined. Mild T-wave inversions (biphasic) in V1 and V2.  Narrative Interpretation: stable EKG (no longer in Afib)  Recent Labs:    Lab Results  Component Value Date   CREATININE 0.83 05/05/2014   Lab Results  Component Value Date   K 3.9 05/05/2014   Lab Results  Component Value Date   CHOL 202* 12/04/2013   HDL 49.90 12/04/2013   LDLCALC  132* 12/04/2013   TRIG 101.0 12/04/2013   CHOLHDL 4 12/04/2013     ASSESSMENT / PLAN: Problem List Items Addressed This Visit    Atrial fibrillation with rapid ventricular response - Primary    Now bradycardic in sinus rhythm. We will add any additional AV nodal agent or an arrhythmic for now. Until we know how often she will have recurrences, I would simply treat with rate control. She seems to be doing very well with Xarelto. We will provide a 90 day prescription.      Relevant Medications   rivaroxaban (XARELTO) 20 MG TABS tablet   Other Relevant Orders   EKG 12-Lead (Completed)   Echocardiogram   Myocardial Perfusion Imaging   CAD- 100% LAD, s/p SVG-LAD after failed LIMA-LAD (Chronic)    No active anginal symptoms. She is on a beta blocker. No longer on aspirin because she is on Xarelto. Had previously been on statin and not currently listed. Not sure what happened.  The plan was for her to have a Myoview this fall prior to her November followup, however with the new diagnosis of atrial fibrillation, we'll then proceed with Myoview at this time in order to exclude an ischemic etiology for her [name].      Relevant Medications   rivaroxaban (XARELTO) 20 MG TABS tablet   Other Relevant Orders   EKG 12-Lead (Completed)   Echocardiogram   Myocardial Perfusion Imaging   Hyperlipidemia with target LDL less than 70 (Chronic)    LDL does not look as good as it has in the past.she is no longer on statin. This has been followed by her PCP. We did not really discuss it much today, since the main focus was on the eighth it. This will be discussed in her next followup.       Relevant  Medications   rivaroxaban (XARELTO) 20 MG TABS tablet   Other Relevant Orders   EKG 12-Lead (Completed)   Echocardiogram   Myocardial Perfusion Imaging   New onset atrial fibrillation    New diagnosis from hospitalization at the end of April. She said several of the other symptoms, denies any symptoms  of [name] RVR she had been in remission since hospital discharge. She seems to be tolerating the increased dose of Lopressor without much difficulty. No bleeding issues on that also.  Evaluation for etiology of [name] in a patient with CAD:  2-D echocardiogram as recommended by inpatient consultation  Lexiscan Myoview to evaluate for ischemic etiology. Lexiscan, and not exercise Myoview the desire to continue metoprolol.  I spent about 20 minutes explaining to her the pathophysiology of atrial fibrillation as well as the potential symptoms and complications including exacerbation of diastolic heart failure and stroke. We also discussed different options of rate versus rhythm control with a nodal agents +/- antiarrhythmics.        Relevant Medications   rivaroxaban (XARELTO) 20 MG TABS tablet   S/P CABG x 2 (Chronic)   Relevant Orders   EKG 12-Lead (Completed)   Echocardiogram   Myocardial Perfusion Imaging   ST elevation (STEMI) myocardial infarction involving left anterior descending coronary artery (Chronic)    Complicated cardiac history but no major issues since her redo the CABG. She does have evidence of anterior-apical infarct with mild peri-infarct ischemia on Myoview in 2012. Also hypokinesis on echocardiogram. Despite this her EF has been preserved at 50 and 55%. Last echocardiogram was in 2014. Plan: Check in [facility] as well as echocardiogram for recent diagnosis of [name].      Relevant Medications   rivaroxaban (XARELTO) 20 MG TABS tablet   Other Relevant Orders   EKG 12-Lead (Completed)   Echocardiogram   Myocardial Perfusion Imaging   Tobacco abuse (Chronic)    She really didn't seem all that up and discussion of smoking cessation but I at least did try. We spoke a few minutes. She doubts that she needs to quit, but talked about potentially being on Wellbutrin. I think this would be a good idea, would prefer that this be managed by her PCP. Unfortunately,she  has several specialists but no general care physician.          Close to 45 minutes total spent directly with the patient and her husband answering all questions and in consultation.  Over half the time was consultation/counseling and education. Additional 15 minutes on chart review of her recent hospitalization. Followup: 6 months   Marlies Ligman, Leonie Green, M.D., M.S. Interventional Cardiologist   Pager # [phone]

## 2014-05-12 NOTE — Patient Instructions (Signed)
Your physician has requested that you have an echocardiogram. Echocardiography is a painless test that uses sound waves to create images of your heart. It provides your doctor with information about the size and shape of your heart and how well your heart's chambers and valves are working. This procedure takes approximately one hour. There are no restrictions for this procedure.  Your physician has requested that you have a lexiscan myoview. For further information please visit [email or web address]. Please follow instruction sheet, as given.  Your physician wants you to follow-up in [place].\  You will receive a reminder letter in the mail two months in advance. If you don't receive a letter, please call our office to schedule the follow-up appointment.

## 2014-05-13 ENCOUNTER — Ambulatory Visit: Payer: Medicare Other | Admitting: Endocrinology

## 2014-05-14 ENCOUNTER — Encounter: Payer: Self-pay | Admitting: Cardiology

## 2014-05-14 DIAGNOSIS — I4891 Unspecified atrial fibrillation: Secondary | ICD-10-CM | POA: Insufficient documentation

## 2014-05-14 DIAGNOSIS — I4819 Other persistent atrial fibrillation: Secondary | ICD-10-CM | POA: Insufficient documentation

## 2014-05-14 NOTE — Assessment & Plan Note (Signed)
No active anginal symptoms. She is on a beta blocker. No longer on aspirin because she is on Xarelto. Had previously been on statin and not currently listed. Not sure what happened.  The plan was for her to have a Myoview this fall prior to her November followup, however with the new diagnosis of atrial fibrillation, we'll then proceed with Myoview at this time in order to exclude an ischemic etiology for her [name].

## 2014-05-14 NOTE — Assessment & Plan Note (Addendum)
LDL does not look as good as it has in the past.she is no longer on statin. This has been followed by her PCP. We did not really discuss it much today, since the main focus was on the eighth it. This will be discussed in her next followup.

## 2014-05-14 NOTE — Assessment & Plan Note (Signed)
Complicated cardiac history but no major issues since her redo the CABG. She does have evidence of anterior-apical infarct with mild peri-infarct ischemia on Myoview in 2012. Also hypokinesis on echocardiogram. Despite this her EF has been preserved at 50 and 55%. Last echocardiogram was in 2014. Plan: Check in [facility] as well as echocardiogram for recent diagnosis of [name].

## 2014-05-14 NOTE — Assessment & Plan Note (Signed)
Now bradycardic in sinus rhythm. We will add any additional AV nodal agent or an arrhythmic for now. Until we know how often she will have recurrences, I would simply treat with rate control. She seems to be doing very well with Xarelto. We will provide a 90 day prescription.

## 2014-05-14 NOTE — Assessment & Plan Note (Signed)
She really didn't seem all that up and discussion of smoking cessation but I at least did try. We spoke a few minutes. She doubts that she needs to quit, but talked about potentially being on Wellbutrin. I think this would be a good idea, would prefer that this be managed by her PCP. Unfortunately,she has several specialists but no general care physician.

## 2014-05-14 NOTE — Assessment & Plan Note (Signed)
New diagnosis from hospitalization at the end of April. She said several of the other symptoms, denies any symptoms of [name] RVR she had been in remission since hospital discharge. She seems to be tolerating the increased dose of Lopressor without much difficulty. No bleeding issues on that also.  Evaluation for etiology of [name] in a patient with CAD:  2-D echocardiogram as recommended by inpatient consultation  Lexiscan Myoview to evaluate for ischemic etiology. Lexiscan, and not exercise Myoview the desire to continue metoprolol.  I spent about 20 minutes explaining to her the pathophysiology of atrial fibrillation as well as the potential symptoms and complications including exacerbation of diastolic heart failure and stroke. We also discussed different options of rate versus rhythm control with a nodal agents +/- antiarrhythmics.

## 2014-05-22 ENCOUNTER — Ambulatory Visit: Payer: Medicare Other | Admitting: Endocrinology

## 2014-06-05 ENCOUNTER — Telehealth (HOSPITAL_COMMUNITY): Payer: Self-pay

## 2014-06-05 NOTE — Telephone Encounter (Signed)
Encounter complete. 

## 2014-06-09 ENCOUNTER — Telehealth: Payer: Self-pay | Admitting: Cardiology

## 2014-06-09 ENCOUNTER — Other Ambulatory Visit: Payer: Self-pay | Admitting: Endocrinology

## 2014-06-09 NOTE — Telephone Encounter (Signed)
Pt is on Xarelto,she have had 2 nose bleeds since she have been on this. The last time she had to call EMS to get it stopped. Please call to advise.

## 2014-06-09 NOTE — Telephone Encounter (Signed)
Pt on Xarelto x1 month since hospitalization for A Fib w/ RVR.  She notes she had 2 episodes of epistaxis, 1 about 2 weeks after leaving hospital, and again 3 nights ago.  The first episode she was able to stop the nosebleed after about 20 mins applying direct pressure.  Second episode lasted 30-40 minutes, she became concerned and called EMS, they were able to contain it shortly after arriving.  I outlined some basic education regarding nosebleeds. Discussed prevention, discussed environmental factors/dry air, trauma, foreign body intrusion, caution w/ forceful blowing of nose, etc.  She endorsed spontaneous onset. Advised on use of basic first aid/home health items for best control of bleed. She verbalized understanding.  Advised Urgent Care if bleed uncontrolled +1 hr & she can safely make use of private vehicle.   Pt requests I make Dr. Ellyn Hack aware, ask if medication changes advised.

## 2014-06-09 NOTE — Telephone Encounter (Signed)
Called, phone rings and goes to VM box which has not been set up.

## 2014-06-09 NOTE — Telephone Encounter (Signed)
I think we can possibly reduce Xarelto dose to 15 mg if Epistaxis continues to be an issue.   Agree with other rec's.  [place]

## 2014-06-10 ENCOUNTER — Encounter (HOSPITAL_COMMUNITY): Payer: Medicare Other

## 2014-06-16 ENCOUNTER — Telehealth (HOSPITAL_COMMUNITY): Payer: Self-pay | Admitting: *Deleted

## 2014-06-17 ENCOUNTER — Other Ambulatory Visit (HOSPITAL_COMMUNITY): Payer: Medicare Other

## 2014-06-19 ENCOUNTER — Telehealth (HOSPITAL_COMMUNITY): Payer: Self-pay

## 2014-06-19 NOTE — Telephone Encounter (Signed)
Encounter complete. 

## 2014-06-24 ENCOUNTER — Ambulatory Visit (HOSPITAL_COMMUNITY)
Admission: RE | Admit: 2014-06-24 | Discharge: 2014-06-24 | Disposition: A | Discharge status: Home / Self Care | Payer: Medicare Other | Source: Ambulatory Visit | Attending: Cardiology | Admitting: Cardiology

## 2014-06-24 DIAGNOSIS — I2510 Atherosclerotic heart disease of native coronary artery without angina pectoris: Secondary | ICD-10-CM | POA: Diagnosis not present

## 2014-06-24 DIAGNOSIS — Z951 Presence of aortocoronary bypass graft: Secondary | ICD-10-CM

## 2014-06-24 DIAGNOSIS — I2102 ST elevation (STEMI) myocardial infarction involving left anterior descending coronary artery: Secondary | ICD-10-CM | POA: Diagnosis not present

## 2014-06-24 DIAGNOSIS — I4891 Unspecified atrial fibrillation: Secondary | ICD-10-CM | POA: Diagnosis not present

## 2014-06-24 DIAGNOSIS — E785 Hyperlipidemia, unspecified: Secondary | ICD-10-CM | POA: Diagnosis not present

## 2014-06-24 LAB — MYOCARDIAL PERFUSION IMAGING
LV dias vol: 79 mL
LV sys vol: 34 mL
Peak HR: 93 {beats}/min
Rest HR: 54 {beats}/min
SDS: 3
SRS: 2
SSS: 5
TID: 1.01

## 2014-06-24 MED ORDER — AMINOPHYLLINE 25 MG/ML IV SOLN
75.0000 mg | Freq: Once | INTRAVENOUS | Status: AC
  Administered 2014-06-24: 75 mg via INTRAVENOUS

## 2014-06-24 MED ORDER — TECHNETIUM TC 99M SESTAMIBI GENERIC - CARDIOLITE
30.5000 | Freq: Once | INTRAVENOUS | Status: AC | PRN
  Administered 2014-06-24: 31 via INTRAVENOUS

## 2014-06-24 MED ORDER — TECHNETIUM TC 99M SESTAMIBI GENERIC - CARDIOLITE
10.9000 | Freq: Once | INTRAVENOUS | Status: AC | PRN
  Administered 2014-06-24: 10.9 via INTRAVENOUS

## 2014-06-24 MED ORDER — REGADENOSON 0.4 MG/5ML IV SOLN
0.4000 mg | Freq: Once | INTRAVENOUS | Status: AC
  Administered 2014-06-24: 0.4 mg via INTRAVENOUS

## 2014-06-25 ENCOUNTER — Telehealth: Payer: Self-pay | Admitting: *Deleted

## 2014-06-25 NOTE — Telephone Encounter (Signed)
Spoke to patient. Result given . Verbalized understanding  

## 2014-06-25 NOTE — Telephone Encounter (Signed)
-----   Message from Leonie Man, MD sent at 06/25/2014  7:58 AM EDT ----- Good news - the Myoview shows a small "scar / infarct" that correlates with known history, but does not show signs of "ischemia" (insufficient blood flow) during stress vs. Rest.  LOW RISK study.  The old infarct has not adversely affected the heart function.   [place]

## 2014-07-03 ENCOUNTER — Ambulatory Visit (HOSPITAL_COMMUNITY)
Admission: RE | Admit: 2014-07-03 | Discharge: 2014-07-03 | Disposition: A | Discharge status: Home / Self Care | Payer: Medicare Other | Source: Ambulatory Visit | Attending: Cardiology | Admitting: Cardiology

## 2014-07-03 DIAGNOSIS — I340 Nonrheumatic mitral (valve) insufficiency: Secondary | ICD-10-CM | POA: Insufficient documentation

## 2014-07-03 DIAGNOSIS — E785 Hyperlipidemia, unspecified: Secondary | ICD-10-CM | POA: Diagnosis not present

## 2014-07-03 DIAGNOSIS — I2102 ST elevation (STEMI) myocardial infarction involving left anterior descending coronary artery: Secondary | ICD-10-CM

## 2014-07-03 DIAGNOSIS — I2510 Atherosclerotic heart disease of native coronary artery without angina pectoris: Secondary | ICD-10-CM

## 2014-07-03 DIAGNOSIS — I4891 Unspecified atrial fibrillation: Secondary | ICD-10-CM

## 2014-07-03 DIAGNOSIS — I358 Other nonrheumatic aortic valve disorders: Secondary | ICD-10-CM | POA: Insufficient documentation

## 2014-07-03 DIAGNOSIS — Z951 Presence of aortocoronary bypass graft: Secondary | ICD-10-CM

## 2014-07-09 ENCOUNTER — Telehealth: Payer: Self-pay | Admitting: *Deleted

## 2014-07-09 NOTE — Telephone Encounter (Signed)
Spoke to patient. Result given . Verbalized understanding  

## 2014-07-09 NOTE — Telephone Encounter (Signed)
-----   Message from Leonie Man, MD sent at 07/09/2014 12:23 AM EDT ----- Kermit Balo news, pretty normal /stable echocardiogram with normal ejection fraction of 55-60%. As expected there is still low but decreased motion in the area affected by the heart attack a month ago. This is, if anything, a little bit improved.  Overall good news.  Leonie Man, MD

## 2014-07-28 ENCOUNTER — Telehealth: Payer: Self-pay | Admitting: Cardiology

## 2014-07-28 NOTE — Telephone Encounter (Signed)
Pt called in wanting to speak with the nurse about some medical documentation. She did not want to be specific about what the issue was. Please call  Thanks

## 2014-07-28 NOTE — Telephone Encounter (Signed)
called spoke to patient Patient states she only wants to speak to Dr Ellyn Hack.  She sates again she only want to speak to the doctor RN informed patient will send him the information  PHONE # [phone]

## 2014-08-05 NOTE — Telephone Encounter (Signed)
Dr Ellyn Hack SPOKE TO Dominique Barber ON 07/28/14   Dominique Barber WANTED TO INFORMATION FOR JURY DUTY DR Sturgis INFORMED Dominique Barber TO CONTACT MEDICAL RECORDS TO GET A COPY OF LAST OFFICE NOTE IF SHE LIKES. Dominique Barber VERBALIZED UNDERSTANDING.

## 2014-08-05 NOTE — Telephone Encounter (Signed)
Has Dr. Ellyn Hack spoken to this pt yet?

## 2014-09-16 ENCOUNTER — Telehealth: Payer: Self-pay | Admitting: Cardiology

## 2014-09-16 NOTE — Telephone Encounter (Signed)
Pt needs a letter to be excused from jury duty please.

## 2014-09-18 ENCOUNTER — Encounter: Payer: Self-pay | Admitting: *Deleted

## 2014-09-18 NOTE — Telephone Encounter (Signed)
Py says she need the letter before 1 today please.

## 2014-09-18 NOTE — Telephone Encounter (Signed)
Spoke to husband ,informed him  Will contact Dr Ellyn Hack  Husband states patient has juror duty Next week and needs letter today.  RN spoke to Dr Ellyn Hack - okay to give letter to patient -that she is under is care.  Letter written and husband will pick up , and routed to [place].

## 2014-09-19 ENCOUNTER — Ambulatory Visit (INDEPENDENT_AMBULATORY_CARE_PROVIDER_SITE_OTHER): Payer: Medicare Other | Admitting: Cardiology

## 2014-09-19 ENCOUNTER — Encounter: Payer: Self-pay | Admitting: Cardiology

## 2014-09-19 VITALS — BP 140/86 | HR 66 | Ht 61.0 in | Wt 112.0 lb

## 2014-09-19 DIAGNOSIS — E785 Hyperlipidemia, unspecified: Secondary | ICD-10-CM | POA: Diagnosis not present

## 2014-09-19 DIAGNOSIS — I480 Paroxysmal atrial fibrillation: Secondary | ICD-10-CM

## 2014-09-19 DIAGNOSIS — I2510 Atherosclerotic heart disease of native coronary artery without angina pectoris: Secondary | ICD-10-CM | POA: Diagnosis not present

## 2014-09-19 DIAGNOSIS — I2102 ST elevation (STEMI) myocardial infarction involving left anterior descending coronary artery: Secondary | ICD-10-CM

## 2014-09-19 NOTE — Patient Instructions (Signed)
Continue with current medications  If you develop a rapid heart rate take 50 mg Metoprolol  At that time  If it is time for regular dose - follow this instruction. Take regular dose at the next  appointed time.  Call office if this happens during the week ,if happen on weekend go to the hospital.  Your physician recommends that you schedule a follow-up appointment in 6 month with Dr Ellyn Hack- 92 MIN .

## 2014-09-19 NOTE — Progress Notes (Signed)
PCP: Elayne Snare, MD  Clinic Note: Chief Complaint  Patient presents with  . 3 MONTHS  . Chest Pain    HAD CHEST DISCOMFORT AND PAIN IN HER RIGHT ARM  . Shortness of Breath    YESTERDAY AFTER CLIMBING SOME STAIRS  . Palpitations    YESTERDAY AFTER CLIMBING SOME STAIRS  . Edema    FEET AND LEGS    HPI: Dominique Barber is a 59 y.o. female with a PMH below who presents today for 4 month f/u of CAD-CABG, PAF & to discuss results of Myoview & Echo ordered @ last visit to evaluate for possible ischemic etiology for [name].  Dominique Barber was last seen on May 11 2013 as a post-hospital f/u for Afib RVR (rate controlled, Xarelto -- spontaneously converted to NSR). Past Cardiac History:  Anterior STEMI 3557: complicated by VF arrest and mild anoxic brain injury following decompensation/shock  She was finally treated with a angioplasty of the LAD at that time.   Shortly thereafter she underwent CABG with the LIMA-LAD and SVG-D1. -   She then had a Complication when the LIMA graft and suffered another anterior MI and underwent emergent SVG-LAD bypass.  1. Her last cardiac catheterization was in 2005 with no significant change in disease with patent grafts.. 2. Myoview June 2015 - Small mid anterior wall scar without ischemia. Low risk study  By her report, since her CABG she has been troubled with IBS  Recent Hospitalizations: none since April 2016 for [name]-RVR  Studies Reviewed: PSH updated.  Myoview 06/24/14: Small mid anterior wall scar without ischemia. Low risk study  The left ventricular ejection fraction is hyperdynamic (>65%).  Defect 1: There is a small FIXED  defect of moderate severity present in the mid anterior location.  There was no ST segment deviation noted during stress.    Echo 07/03/14:  - Left ventricle: LV cavity size normal. Systolic function was normal.  EF 55% to 60%.  Mild hypokinesis of the apical anterior myocardium. (consistent with known  anterior MI) - Aortic valve: Trileaflet; mildly thickened, mildly calcified leaflets. - Mitral valve: There was mild regurgitation.  Interval History: Overall, she is doing pretty well from a cardiac standpoint. She had one episode yesterday of some chest discomfort and pain on the right side of her chest and right arm. It is not essentially associated with exertion. Last about 10 minutes and then resolve spontaneously. Otherwise she does note some shortness of breath when climbing stairs, especially if she does it in a rapid rate. Yesterday she was climbing the stairs in her hurry and got short of breath and felt her heart rate racing. It took a while for it to come down which is not usual for her. Otherwise she is not in any of her symptoms besides a mild swelling in her feet and likes this chronic.  When she felt her heart rate going fast, she denied any sensation of irregularity of the rhythm. Nothing to suggest her symptoms like the atrial fibrillation admission.  No PND, orthopnea or edema.  No lightheadedness, dizziness, weakness or syncope/near syncope. No TIA/amaurosis fugax symptoms. No melena, hematochezia, hematuria, or epstaxis. No claudication.  Past Medical History  Diagnosis Date  . ST elevation (STEMI) myocardial infarction involving left anterior descending coronary artery  01/19/1996    Calculated by VF arrest, and anoxic brain injury with status epilepticus; POBA of LAD  . CAD in native artery 1998    Followup 1 month post  MI revealed 95% ISR in PTCA site --> PCI with 3.0 mm x 25 mm BMS stent in proximal LAD --> 5 months later recurrent ISR of LAD BMS that compromised D1 --> referred for CABG  . S/P CABG x 04 July 1996    Initially LIMA-LAD, SVG-D1 --> immediate LIMA failure post CABG with anterior MI --> urgent redo SVG-LAD beyond D2.; Widely patent grafts as of 2005 with left dominant system. Graft to the diagonal branch has a very small target vessel but the vein graft to  LAD retrograde fills a large bifurcating D2 and antegrade fills a large D3.  Marland Kitchen Hyperlipidemia   . COPD (chronic obstructive pulmonary disease)   . Tobacco abuse   . Irritable bowel syndrome     Followed by Dr. Juanita Craver.  Marland Kitchen GERD (gastroesophageal reflux disease)   . Hypothyroidism     On Synthroid  . Iron deficiency anemia   . PAF (paroxysmal atrial fibrillation) 05/03/2014    New onset - originally with RVR    Past Surgical History  Procedure Laterality Date  . Appendectomy    . Cesarean section    . Cholecystectomy    . Coronary angioplasty  01/19/1996    acute anterior wall MI, anoxic encephalopahty, VF; LCfx free of disease and dominant, RCA patent, L main short and patent, LAD with 70-80% narrowing and focal 95% stenosis in distal 3rd with balloon angioplasty (Dr. Marella Chimes)  . Coronary angioplasty with stent placement  02/26/1996    3.0x84mm Multi-Link stent to prox/mid LAD; L main normal & short, Cfx dominant and normal, PDA & PLA normal, RCA non-dominant with small PLA and normal (Dr. Marella Chimes)  . Cardiac catheterization  07/30/1996    normal L main, LAD w/95% stenosis just before stent, diffuse 80-85% end-stent restenosis, 1st diagonal with70-75% ostial stenosis, large optional diagonal that was normal, Cfx with 2 marginals and distal PLA (all normal), RCA normal (Dr. Marella Chimes)  . Cardiac catheterization  01/31/2003    LAD totally occluded in prox 3rd, patent Cfx, SVG to mid LAd patent, SVG to small DX1 patent, LIMA to LAD was atretic and essentially occluded in junction of prox & mid 3rd, R barciocephalic and subclavian were normal (Dr. Marella Chimes)  . Coronary artery bypass graft  07/31/1996    x2; LIMA to LAD and SVG to 1st diagonal (Dr. Remus Loffler)  . Coronary artery bypass graft  07/31/1996    x1; SVG to distal LAD (Dr. Remus Loffler)  . Nm myocar perf wall motion  10/2010; 06/2014    a) LexiScan Cardiolite - mild perfusion defect r/t infarct/scar with mild periifarct  ischemia in mid anterior & apical anterior region; EF 67%; abnormal, low risk study;; b) Small, moderate intensity Fixed perfusion defect - mid Anterior Wall c/w known Anterior MI. LOW RISK   . Transthoracic echocardiogram  02/2012; 06/2014    a) EF 50-55%, mild HK of anterospetal myocardium; mild MR;; b) EF 55-60%, mild HK of apical anterior wall.   Mild MR    ROS: A comprehensive was performed. Review of Systems  Constitutional: Negative for malaise/fatigue.  HENT: Negative for nosebleeds.        Just had 8 teeth pulled recently. Still feels a little bit out of it from it.  Cardiovascular: Positive for leg swelling. Negative for claudication.       Per history of present illness  Gastrointestinal: Positive for abdominal pain, diarrhea and constipation. Negative for blood in stool.  IBS symptoms  Genitourinary: Negative for hematuria.  Musculoskeletal: Positive for joint pain (right shoulder pain).  Neurological: Positive for dizziness (Sometimes positional, sometimes if she feels her heart racing following exertion). Negative for focal weakness and headaches.  Endo/Heme/Allergies: Does not bruise/bleed easily.  Psychiatric/Behavioral: Negative for depression. The patient is nervous/anxious.   All other systems reviewed and are negative.   Prior to Admission medications   Medication Sig Start Date End Date Taking? Authorizing Advit Trethewey  albuterol (PROVENTIL HFA;VENTOLIN HFA) 108 (90 BASE) MCG/ACT inhaler Inhale 1-2 puffs into the lungs every 6 (six) hours as needed for wheezing or shortness of breath. 05/05/14   Modena Jansky, MD  CALCIUM PO Take 1 tablet by mouth daily.     Historical Kyanne Rials, MD  cetirizine (ZYRTEC) 10 MG tablet TAKE 1 TABLET DAILY 06/09/14   Elayne Snare, MD  Fe Fum-FePoly-Vit C-Vit B3 (INTEGRA PO) Take 1 tablet by mouth daily.     Historical Aneshia Jacquet, MD  GuaiFENesin (TUSSIN PO) Take 30 mLs by mouth every 4 (four) hours as needed. For cough    Historical Jezabelle Chisolm, MD   hyoscyamine (LEVSIN, ANASPAZ) 0.125 MG tablet Take 0.125 mg by mouth 2 (two) times daily.     Historical Zyaire Mccleod, MD  metoprolol tartrate (LOPRESSOR) 25 MG tablet Take 1.5 tablets (37.5 mg total) by mouth 2 (two) times daily. 05/05/14   Modena Jansky, MD  potassium chloride (K-DUR,KLOR-CON) 10 MEQ tablet Take 1 tablet (10 mEq total) by mouth 2 (two) times daily. Patient taking differently: Take 10 mEq by mouth once.  11/12/13   Leonie Man, MD  Probiotic Product (ALIGN PO) Take by mouth daily.    Historical Lemonte Al, MD  rivaroxaban (XARELTO) 20 MG TABS tablet Take 1 tablet (20 mg total) by mouth daily with supper. 05/12/14   Leonie Man, MD  SYNTHROID 100 MCG tablet TAKE 1 TABLET EVERY MORNING ON AN EMPTY STOMACH 02/13/14   Elayne Snare, MD    Allergies  Allergen Reactions  . Iohexol Hives and Rash    Red dye    Social History   Social History  . Marital Status: Married    Spouse Name: N/A  . Number of Children: 3  . Years of Education: N/A   Occupational History  .     Social History Main Topics  . Smoking status: Current Every Day Smoker -- 0.25 packs/day    Types: Cigarettes  . Smokeless tobacco: Current User     Comment: Quit at time of MI, restarted in 2009  . Alcohol Use: No  . Drug Use: None  . Sexual Activity: Not Asked   Other Topics Concern  . None   Social History Narrative   Married, mother of 3 children (2 living sons age 22-26 and 30-31; her daughter was the victim of a murder that took place sometime around the time of the patient's MI. She was under significant amount of stress as her daughter had gone missing. She was never found. Finally the suspect was charged and convicted in 2009. She had sessile he stop smoking until that time frame when it brought back memories and she is now back to smoking a half pack a day.   She does not get routine exercise, but is active around the house and doing housecleaning chores.   She does not drink alcohol.     Family History  Problem Relation Age of Onset  . Heart failure Other   . Diabetes Other   . Diabetes  Mother   . Thyroid disease Paternal Aunt   . Thyroid disease Maternal Grandmother      Wt Readings from Last 3 Encounters:  09/19/14 112 lb (50.803 kg)  06/24/14 110 lb (49.896 kg)  05/12/14 110 lb 9.6 oz (50.168 kg)    PHYSICAL EXAM BP 140/86 mmHg  Pulse 66  Ht 5\' 1"  (1.549 m)  Wt 112 lb (50.803 kg)  BMI 21.17 kg/m2 General appearance: alert, cooperative, appears stated age, no distress, mildly obese and  Psych: Anxious appearing, but answers questions properly. Somewhat subdued affect Neck: no adenopathy, no carotid bruit, no JVD and supple, symmetrical, trachea midline; Small palpable nodule noted in the left thyroid gland. Lungs: clear to auscultation bilaterally, normal percussion bilaterally and Nonlabored, good air movement with mild interstitial sounds Heart: RRR with normal S1 and S2. Nondisplaced PMI. Very early peaking 1-2/6 SEM at [facility]. No other M./R./G. Abdomen: soft, non-tender; bowel sounds normal; no masses, no organomegaly Extremities: extremities normal, atraumatic, no cyanosis or edema Pulses: 2+ and symmetric Skin: Skin color, texture, turgor normal. No rashes or lesions or Well-healed sternotomy scar and venous graft section sites. Neurologic: Alert and oriented X 3, normal strength and tone. Normal symmetric reflexes. Normal coordination and gait CN II-12 grossly intact    Adult ECG Report -- not checked  Additional studies/ records that were reviewed today include:  Recent Labs:  No recent lipids. Chemistries from May 2016 reviewed in [place]. No abnormalities.   ASSESSMENT / PLAN: Problem List Items Addressed This Visit    CAD- 100% LAD, s/p SVG-LAD after failed LIMA-LAD - Primary (Chronic)    Atypical chest discomfort yesterday, it did not sound very anginal in nature.  No further episodes a day. Nonischemic Myoview with expected small anterior  infarct from her known history. Tolerating intermediate dose metoprolol. Not on aspirin or Plavix because of Xarelto. Not currently on statin. [facility] followed by PCP.      Hyperlipidemia with target LDL less than 70 (Chronic)    Lipids are being followed by her PCP. At present she is on cholestyramine more for her IBS. She previously had been on a statin and I don't see the she is currently taking 1.  For now we'll defer to PCP in the absence of having any labs.      Paroxysmal atrial fibrillation (Chronic)    Remains in sinus rhythm with a blocker. Anticoagulated with Xarelto.  I instructed her to use when necessary metoprolol for the following instructions::   If you develop a rapid heart rate take 50 mg Metoprolol  At that time  If it is time for regular dose - follow this instruction.  Take regular dose at the next  appointed time.   Call office if this happens during the week ,if happen on weekend go to the hospital.      ST elevation (STEMI) myocardial infarction involving left anterior descending coronary artery (Chronic)    As expected. Small anterior infarct noted on followup Myoview, but no significant peri-infarct ischemia.         Current medicines are reviewed at length with the patient today. (+/- concerns) none The following changes have been made: none If you develop a rapid heart rate take 50 mg Metoprolol  At that time  If it is time for regular dose - follow this instruction. Take regular dose at the next  appointed time.  Call office if this happens during the week ,if happen on weekend go to the hospital.  Studies Ordered:   No orders of the defined types were placed in this encounter.    follow-up appointment in 6 month with Dr Micki Riley, Leonie Green, M.D., M.S. Interventional Cardiologist   Pager # [phone]

## 2014-09-21 NOTE — Assessment & Plan Note (Signed)
Lipids are being followed by her PCP. At present she is on cholestyramine more for her IBS. She previously had been on a statin and I don't see the she is currently taking 1.  For now we'll defer to PCP in the absence of having any labs.

## 2014-09-21 NOTE — Assessment & Plan Note (Signed)
Atypical chest discomfort yesterday, it did not sound very anginal in nature.  No further episodes a day. Nonischemic Myoview with expected small anterior infarct from her known history. Tolerating intermediate dose metoprolol. Not on aspirin or Plavix because of Xarelto. Not currently on statin. [facility] followed by PCP.

## 2014-09-21 NOTE — Assessment & Plan Note (Signed)
As expected. Small anterior infarct noted on followup Myoview, but no significant peri-infarct ischemia.

## 2014-09-21 NOTE — Assessment & Plan Note (Signed)
Remains in sinus rhythm with a blocker. Anticoagulated with Xarelto.  I instructed her to use when necessary metoprolol for the following instructions::   If you develop a rapid heart rate take 50 mg Metoprolol  At that time  If it is time for regular dose - follow this instruction.  Take regular dose at the next  appointed time.   Call office if this happens during the week ,if happen on weekend go to the hospital.

## 2014-11-09 ENCOUNTER — Other Ambulatory Visit: Payer: Self-pay | Admitting: Endocrinology

## 2014-12-02 ENCOUNTER — Other Ambulatory Visit (INDEPENDENT_AMBULATORY_CARE_PROVIDER_SITE_OTHER): Payer: Medicare Other

## 2014-12-02 DIAGNOSIS — E890 Postprocedural hypothyroidism: Secondary | ICD-10-CM | POA: Diagnosis not present

## 2014-12-02 DIAGNOSIS — E042 Nontoxic multinodular goiter: Secondary | ICD-10-CM

## 2014-12-02 LAB — TSH: TSH: 0.13 u[IU]/mL — ABNORMAL LOW (ref 0.35–4.50)

## 2014-12-03 ENCOUNTER — Other Ambulatory Visit (INDEPENDENT_AMBULATORY_CARE_PROVIDER_SITE_OTHER): Payer: Medicare Other

## 2014-12-03 DIAGNOSIS — E890 Postprocedural hypothyroidism: Secondary | ICD-10-CM | POA: Diagnosis not present

## 2014-12-03 LAB — T4, FREE: Free T4: 1.49 ng/dL (ref 0.60–1.60)

## 2014-12-05 ENCOUNTER — Encounter: Payer: Self-pay | Admitting: Endocrinology

## 2014-12-05 ENCOUNTER — Ambulatory Visit (INDEPENDENT_AMBULATORY_CARE_PROVIDER_SITE_OTHER): Payer: Medicare Other | Admitting: Endocrinology

## 2014-12-05 VITALS — BP 120/72 | HR 61 | Temp 97.9°F | Resp 14 | Ht 61.0 in | Wt 115.2 lb

## 2014-12-05 DIAGNOSIS — E890 Postprocedural hypothyroidism: Secondary | ICD-10-CM

## 2014-12-05 DIAGNOSIS — E042 Nontoxic multinodular goiter: Secondary | ICD-10-CM | POA: Diagnosis not present

## 2014-12-05 DIAGNOSIS — Z23 Encounter for immunization: Secondary | ICD-10-CM

## 2014-12-05 DIAGNOSIS — I2510 Atherosclerotic heart disease of native coronary artery without angina pectoris: Secondary | ICD-10-CM | POA: Diagnosis not present

## 2014-12-05 MED ORDER — LEVOTHYROXINE SODIUM 75 MCG PO TABS: 75.0000 ug | ORAL_TABLET | Freq: Every day | ORAL | Status: DC

## 2014-12-05 NOTE — Progress Notes (Signed)
Patient ID: Dominique Barber, female   DOB: March 12, 1955, 59 y.o.   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  She has had some issues recently with her atrial fibrillation and has occasional skips, followed by cardiologist            The patient is taking the thyroid supplement very consistently in the morning before breakfast.  Not taking any calcium or iron supplements or her cholestyramine with the thyroid supplement.    On the last visit a year ago the dose was continued at 100ug  TSH is also been monitored periodically by cardiologist TSH levels as below  Lab Results  Component Value Date   FREET4 1.49 12/03/2014   FREET4 1.12 12/04/2013   FREET4 1.18 12/03/2012   TSH 0.13* 12/02/2014   TSH 1.165 05/04/2014   TSH 1.435 05/03/2014    GOITER: She apparently has had a multinodular goiter since her 71s. Details of this are also not available at present. However she thinks she has had a biopsy in the past also Does not feel any local pressure or choking sensation Her thyroid is larger on the left lobe      Medication List       This list is accurate as of: 12/05/14  4:57 PM.  Always use your most recent med list.               albuterol 108 (90 BASE) MCG/ACT inhaler  Commonly known as:  PROVENTIL HFA;VENTOLIN HFA  Inhale 1-2 puffs into the lungs every 6 (six) hours as needed for wheezing or shortness of breath.     ALIGN PO  Take by mouth daily.     CALCIUM PO  Take 1 tablet by mouth daily.     cetirizine 10 MG tablet  Commonly known as:  ZYRTEC  TAKE 1 TABLET DAILY     hyoscyamine 0.125 MG tablet  Commonly known as:  LEVSIN, ANASPAZ  Take 0.125 mg by mouth 2 (two) times daily.     INTEGRA PO  Take 1 tablet by mouth daily.     metoprolol tartrate 25 MG tablet  Commonly known as:  LOPRESSOR  Take 1.5 tablets (37.5 mg total) by mouth 2 (two) times daily.     potassium chloride 10 MEQ tablet  Commonly known as:  K-DUR,KLOR-CON  Take 1 tablet (10 mEq total) by mouth 2 (two) times daily.     rivaroxaban 20 MG Tabs tablet  Commonly known as:  XARELTO  Take 1 tablet (20 mg total) by mouth daily with supper.     SYNTHROID 100 MCG tablet  Generic drug:  levothyroxine  TAKE 1 TABLET EVERY MORNING ON AN EMPTY STOMACH     TUSSIN PO  Take 30 mLs by mouth every 4 (four) hours as needed. For cough        Allergies:  Allergies  Allergen Reactions  . Iohexol Hives and Rash    Red dye    Past Medical History  Diagnosis Date  . ST elevation (STEMI) myocardial infarction  involving left anterior descending coronary artery ([place])  01/19/1996    Calculated by VF arrest, and anoxic brain injury with status epilepticus; POBA of LAD  . CAD in native artery 1998    Followup 1 month post MI revealed 95% ISR in PTCA site --> PCI with 3.0 mm x 25 mm BMS stent in proximal LAD --> 5 months later recurrent ISR of LAD BMS that compromised D1 --> referred for CABG  . S/P CABG x 04 July 1996    Initially LIMA-LAD, SVG-D1 --> immediate LIMA failure post CABG with anterior MI --> urgent redo SVG-LAD beyond D2.; Widely patent grafts as of 2005 with left dominant system. Graft to the diagonal branch has a very small target vessel but the vein graft to LAD retrograde fills a large bifurcating D2 and antegrade fills a large D3.  Marland Kitchen Hyperlipidemia   . COPD (chronic obstructive pulmonary disease) ([facility])   . Tobacco abuse   . Irritable bowel syndrome     Followed by Dr. Juanita Craver.  Marland Kitchen GERD  (gastroesophageal reflux disease)   . Hypothyroidism     On Synthroid  . Iron deficiency anemia   . PAF (paroxysmal atrial fibrillation) ([place]) 05/03/2014    New onset - originally with RVR    Past Surgical History  Procedure Laterality Date  . Appendectomy    . Cesarean section    . Cholecystectomy    . Coronary angioplasty  01/19/1996    acute anterior wall MI, anoxic encephalopahty, VF; LCfx free of disease and dominant, RCA patent, L main short and patent, LAD with 70-80% narrowing and focal 95% stenosis in distal 3rd with balloon angioplasty (Dr. Marella Chimes)  . Coronary angioplasty with stent placement  02/26/1996    3.0x55mm Multi-Link stent to prox/mid LAD; L main normal & short, Cfx dominant and normal, PDA & PLA normal, RCA non-dominant with small PLA and normal (Dr. Marella Chimes)  . Cardiac catheterization  07/30/1996    normal L main, LAD w/95% stenosis just before stent, diffuse 80-85% end-stent restenosis, 1st diagonal with70-75% ostial stenosis, large optional diagonal that was normal, Cfx with 2 marginals and distal PLA (all normal), RCA normal (Dr. Marella Chimes)  . Cardiac catheterization  01/31/2003    LAD totally occluded in prox 3rd, patent Cfx, SVG to mid LAd patent, SVG to small DX1 patent, LIMA to LAD was atretic and essentially occluded in junction of prox & mid 3rd, R barciocephalic and subclavian were normal (Dr. Marella Chimes)  . Coronary artery bypass graft  07/31/1996    x2; LIMA to LAD and SVG to 1st diagonal (Dr. Remus Loffler)  . Coronary artery bypass graft  07/31/1996    x1; SVG to distal LAD (Dr. Remus Loffler)  . Nm myocar perf wall motion  10/2010; 06/2014    a) LexiScan Cardiolite - mild perfusion defect r/t infarct/scar with mild periifarct ischemia in mid anterior & apical anterior region; EF 67%; abnormal, low risk study;; b) Small, moderate intensity Fixed perfusion defect - mid Anterior Wall c/w known Anterior MI. LOW RISK   . Transthoracic echocardiogram  02/2012;  06/2014    a) EF 50-55%, mild HK of anterospetal myocardium; mild MR;; b) EF 55-60%, mild HK of apical anterior wall.   Mild MR    Family History  Problem Relation Age of Onset  . Heart failure Other   . Diabetes Other   . Diabetes Mother   . Thyroid disease Paternal Aunt   . Thyroid disease Maternal Grandmother  Social History:  reports that she has been smoking Cigarettes.  She has been smoking about 0.25 packs per day. She uses smokeless tobacco. She reports that she does not drink alcohol. Her drug history is not on file.  REVIEW Of SYSTEMS:  History of hyperlipidemia  Lab Results  Component Value Date   CHOL 202* 12/04/2013   HDL 49.90 12/04/2013   LDLCALC 132* 12/04/2013   TRIG 101.0 12/04/2013   CHOLHDL 4 12/04/2013      She has not established with a PCP for general care despite reminders   Examination:   BP 120/72 mmHg  Pulse 61  Temp(Src) 97.9 F (36.6 C)  Resp 14  Ht 5\' 1"  (1.549 m)  Wt 115 lb 3.2 oz (52.254 kg)  BMI 21.78 kg/m2  SpO2 96%  She looks well  NECK: Thyroid exam shows right lobe is about twice normal, nodular with about 2 distinct nodules on 1.5-2 cm in size Left side is firm and rounded and feels about 2.5-3 cm in size, felt mostly on swallowing No lymphadenopathy in the neck NEUROLOGIC EXAM:  biceps reflexes show normal relaxation No tremor No  edema    Assessment   Hypothyroidism, post ablative and usually stable However her TSH is relatively lowered 0.13 without any change in medication for quite some time or any unusual weight change Since she has a multinodular goiter she may have some functional tissue at this time Also because of her history of atrial fibrillation would like to keep her thyroid level mid normal   Treatment:  Reduce dose to 75 g levothyroxine Follow-up in 66 If TSH is normal on follow-up she will follow-up in another year   [facility] 12/05/2014, 4:57 PM

## 2014-12-07 ENCOUNTER — Other Ambulatory Visit: Payer: Self-pay | Admitting: Endocrinology

## 2014-12-08 ENCOUNTER — Telehealth: Payer: Self-pay | Admitting: Cardiology

## 2014-12-08 NOTE — Telephone Encounter (Signed)
Patient wants to speak with Dr. Ellyn Hack on Tuesday 12/19/14 when he is in the office.  She needs to discuss her thyroid results and some other things.

## 2014-12-08 NOTE — Telephone Encounter (Signed)
Patient states DR Dwyane Dee  Changed thyroid dose from 100 mcg to 50 mcg  She states stop taking INTEGTRA SHE states medication was causing  Air to trap in the upper part of stomach and Cause pain in the upper arms chest. She states she has less discomfort since stopping medication, She does not not know what Dr Ellyn Hack wanted her to do - about the folic acid - Dr Rollene Fare had started her this medications  but she did have some discomfort last night in chest. RN asked if patient discuss this information with PCP or GI DOCTOR Patient stated no - she wanted to see what Dr Ellyn Hack - [facility] Will defer to Dr Ellyn Hack

## 2014-12-08 NOTE — Telephone Encounter (Signed)
I have no idea what Integra is. I don't know what folic acid was prescribed for - would be ok with stopping if PCP ok with stopping.  Leonie Man, MD

## 2014-12-10 NOTE — Telephone Encounter (Signed)
Returned call to patient.Advised Dr.Harding is not familiar with Integra and he does not know why folic acid was prescribed.  Patient stated she was prescribed Integra because she had low iron.Stated folic acid is in with Integra.Stated she wanted to make sure it is ok to stop and will not cause harm.Stated she stopped Integra 2 weeks ago because she was having air in top of abdomen and she thinks that caused her palpitations.Stated palpitations started again this past Sunday night and she had more palpitations last night.Stated her synthroid dose was changed to 75 mcg daily on 12/5.Stated she will see if the new dose will help with palpitations.  She wants to make sure she does not need Integra and it will not cause any harm by stopping.Stated she does not have a PCP.Advised Dr.Harding is out of office.I will send message to him to see if he wants to order a cbc.  Advised to call back if she continues to have palpitations.

## 2014-12-10 NOTE — Telephone Encounter (Signed)
Pt says she is still waiting to hear what Dr Ellyn Hack said.If not there,please call-[phone].

## 2014-12-10 NOTE — Telephone Encounter (Signed)
I did not start the Integra - but if is an Iron supplement, she may need iron.  There are probably other options.   Would defer to Dr. Dwyane Dee (or whomever prescribed the medications)  Adjusting thyroid dose could trigger palpitations while she gets used to the new dose.  Leonie Man, MD

## 2014-12-11 NOTE — Telephone Encounter (Signed)
Received a call from patient.Dr.Harding's recommendations given.

## 2014-12-11 NOTE — Telephone Encounter (Signed)
Returned call to patient no answer.Left message with husband to return my call.

## 2014-12-28 ENCOUNTER — Other Ambulatory Visit: Payer: Self-pay | Admitting: Cardiology

## 2015-01-01 ENCOUNTER — Other Ambulatory Visit: Payer: Self-pay | Admitting: *Deleted

## 2015-01-01 MED ORDER — LEVOTHYROXINE SODIUM 75 MCG PO TABS: 75.0000 ug | ORAL_TABLET | Freq: Every day | ORAL | Status: DC

## 2015-01-02 ENCOUNTER — Telehealth: Payer: Self-pay | Admitting: Endocrinology

## 2015-01-02 NOTE — Telephone Encounter (Signed)
Noted, pharmacy is aware 

## 2015-01-02 NOTE — Telephone Encounter (Signed)
That would be fine 

## 2015-01-02 NOTE — Telephone Encounter (Signed)
Cathy from [facility] call stated that she need some clarification on patient medication Levothyroxine  Phone # [phone] Ref # [id number]

## 2015-01-02 NOTE — Telephone Encounter (Signed)
I spoke with the pharmacist who said the patient has a dye allergy and would not be able to take the 75 mcg, he wanted to make sure it would be okay to change to the 50 mcg which is a white pill and have her take 1.5, please advise if okay?

## 2015-01-06 ENCOUNTER — Telehealth: Payer: Self-pay | Admitting: Cardiology

## 2015-01-06 NOTE — Telephone Encounter (Signed)
Pt has a cold,she has a cough.Pt wants to know what cough medicine she can take with her other medicine and her condition?

## 2015-01-06 NOTE — Telephone Encounter (Signed)
Returned call to patient she stated she has a frequent cough with clear phlegm.Advised she can take plain robitussin or plain mucinex as needed.Advised to call PCP if not better.

## 2015-01-08 ENCOUNTER — Other Ambulatory Visit: Payer: Self-pay | Admitting: Cardiology

## 2015-01-08 NOTE — Telephone Encounter (Signed)
Rx(s) sent to pharmacy electronically.  

## 2015-01-13 ENCOUNTER — Other Ambulatory Visit: Payer: Medicare Other

## 2015-01-14 ENCOUNTER — Other Ambulatory Visit (INDEPENDENT_AMBULATORY_CARE_PROVIDER_SITE_OTHER): Payer: Medicare Other

## 2015-01-14 DIAGNOSIS — E890 Postprocedural hypothyroidism: Secondary | ICD-10-CM | POA: Diagnosis not present

## 2015-01-14 LAB — TSH: TSH: 1.53 u[IU]/mL (ref 0.35–4.50)

## 2015-01-14 LAB — T4, FREE: Free T4: 1.12 ng/dL (ref 0.60–1.60)

## 2015-01-16 ENCOUNTER — Ambulatory Visit (INDEPENDENT_AMBULATORY_CARE_PROVIDER_SITE_OTHER): Payer: Medicare Other | Admitting: Endocrinology

## 2015-01-16 VITALS — BP 102/70 | Temp 98.5°F | Ht 61.0 in | Wt 116.8 lb

## 2015-01-16 DIAGNOSIS — E042 Nontoxic multinodular goiter: Secondary | ICD-10-CM | POA: Diagnosis not present

## 2015-01-16 NOTE — Patient Instructions (Signed)
Same dose 

## 2015-01-16 NOTE — Progress Notes (Signed)
Pre visit review using our clinic review tool, if applicable. No additional management support is needed unless otherwise documented below in the visit note. 

## 2015-01-16 NOTE — Progress Notes (Signed)
Patient ID: Dominique Barber, female   DOB: 02-May-1955, 60 y.o.   MRN: [id number]   Reason for Appointment:  Hypothyroidism, followup visit    History of Present Illness:   The hypothyroidism was first diagnosed in 1999 after radioactive iodine treatment for toxic nodular goiter Subsequently she became hypothyroid and has been on thyroid supplementation She has generally been on stable dose of Synthroid  On her follow-up visit in 11/16 her TSH was lower than usual at 0.13 Her dose was reduced from 100 down to 75 g  Patient has no complaints of unusual fatigue Does not feel any different with the dose change She has had some issues periodically with her atrial fibrillation and has occasional skips, followed by cardiologist            The patient is taking the thyroid supplement very consistently in the morning before breakfast.  Not taking any calcium or iron supplements or her cholestyramine with the thyroid supplement.     TSH levels as below  Lab Results  Component Value Date   FREET4 1.12 01/14/2015   FREET4 1.49 12/03/2014   FREET4 1.12 12/04/2013   TSH 1.53 01/14/2015   TSH 0.13* 12/02/2014   TSH 1.165 05/04/2014    GOITER: She apparently has had a multinodular goiter since her 31s. Details of this are also not available at present. However she thinks she has had a biopsy in the past also Does not feel any local pressure or choking sensation Her thyroid is larger on the left lobe      Medication List       This list is accurate as of: 01/16/15  4:06 PM.  Always use your most recent med list.               albuterol 108 (90 Base) MCG/ACT inhaler  Commonly known as:  PROVENTIL HFA;VENTOLIN HFA  Inhale 1-2 puffs into the lungs every 6 (six) hours as needed for wheezing or shortness of breath.     ALIGN PO  Take by mouth daily.     CALCIUM PO  Take 1 tablet by mouth daily.     cetirizine 10 MG tablet  Commonly known as:  ZYRTEC  TAKE 1 TABLET  DAILY     hyoscyamine 0.125 MG tablet  Commonly known as:  LEVSIN, ANASPAZ  Take 0.125 mg by mouth 2 (two) times daily.     SYMAX-SR 0.375 MG 12 hr tablet  Generic drug:  hyoscyamine     INTEGRA PO  Take 1 tablet by mouth daily. Reported on 01/16/2015     KLOR-CON 10 10 MEQ tablet  Generic drug:  potassium chloride  TAKE 1 TABLET TWICE A DAY     levothyroxine 75 MCG tablet  Commonly known as:  SYNTHROID, LEVOTHROID  Take 1 tablet (75 mcg total) by mouth daily.     metoprolol tartrate 25 MG tablet  Commonly known as:  LOPRESSOR  TAKE 1 TABLET TWICE A DAY     rivaroxaban 20 MG Tabs tablet  Commonly known as:  XARELTO  Take 1 tablet (20 mg total) by mouth daily with supper.     TUSSIN PO  Take 30 mLs by mouth every 4 (four) hours as needed. For cough        Allergies:  Allergies  Allergen Reactions  . Iohexol Hives and Rash    Red dye    Past Medical History  Diagnosis Date  . ST elevation (STEMI) myocardial infarction  involving left anterior descending coronary artery ([place])  01/19/1996    Calculated by VF arrest, and anoxic brain injury with status epilepticus; POBA of LAD  . CAD in native artery 1998    Followup 1 month post MI revealed 95% ISR in PTCA site --> PCI with 3.0 mm x 25 mm BMS stent in proximal LAD --> 5 months later recurrent ISR of LAD BMS that compromised D1 --> referred for CABG  . S/P CABG x 04 July 1996    Initially LIMA-LAD, SVG-D1 --> immediate LIMA failure post CABG with anterior MI --> urgent redo SVG-LAD beyond D2.; Widely patent grafts as of 2005 with left dominant system. Graft to the diagonal branch has a very small target vessel but the vein graft to LAD retrograde fills a large bifurcating D2 and antegrade fills a large D3.  Marland Kitchen Hyperlipidemia   . COPD (chronic obstructive pulmonary disease) ([place])   . Tobacco abuse   . Irritable bowel syndrome     Followed by Dr. Juanita Craver.  Marland Kitchen GERD (gastroesophageal reflux disease)   . Hypothyroidism      On Synthroid  . Iron deficiency anemia   . PAF (paroxysmal atrial fibrillation) ([place]) 05/03/2014    New onset - originally with RVR    Past Surgical History  Procedure Laterality Date  . Appendectomy    . Cesarean section    . Cholecystectomy    . Coronary angioplasty  01/19/1996    acute anterior wall MI, anoxic encephalopahty, VF; LCfx free of disease and dominant, RCA patent, L main short and patent, LAD with 70-80% narrowing and focal 95% stenosis in distal 3rd with balloon angioplasty (Dr. Marella Chimes)  . Coronary angioplasty with stent placement  02/26/1996    3.0x18mm Multi-Link stent to prox/mid LAD; L main normal & short, Cfx dominant and normal, PDA & PLA normal, RCA non-dominant with small PLA and normal (Dr. Marella Chimes)  . Cardiac catheterization  07/30/1996    normal L main, LAD w/95% stenosis just before stent, diffuse 80-85% end-stent restenosis, 1st diagonal with70-75% ostial stenosis, large optional diagonal that was normal, Cfx with 2 marginals and distal PLA (all normal), RCA normal (Dr. Marella Chimes)  . Cardiac catheterization  01/31/2003    LAD totally occluded in prox 3rd, patent Cfx, SVG to mid LAd patent, SVG to small DX1 patent, LIMA to LAD was atretic and essentially occluded in junction of prox & mid 3rd, R barciocephalic and subclavian were normal (Dr. Marella Chimes)  . Coronary artery bypass graft  07/31/1996    x2; LIMA to LAD and SVG to 1st diagonal (Dr. Remus Loffler)  . Coronary artery bypass graft  07/31/1996    x1; SVG to distal LAD (Dr. Remus Loffler)  . Nm myocar perf wall motion  10/2010; 06/2014    a) LexiScan Cardiolite - mild perfusion defect r/t infarct/scar with mild periifarct ischemia in mid anterior & apical anterior region; EF 67%; abnormal, low risk study;; b) Small, moderate intensity Fixed perfusion defect - mid Anterior Wall c/w known Anterior MI. LOW RISK   . Transthoracic echocardiogram  02/2012; 06/2014    a) EF 50-55%, mild HK of anterospetal myocardium;  mild MR;; b) EF 55-60%, mild HK of apical anterior wall.   Mild MR    Family History  Problem Relation Age of Onset  . Heart failure Other   . Diabetes Other   . Diabetes Mother   . Thyroid disease Paternal Aunt   . Thyroid disease Maternal Grandmother  Social History:  reports that she has been smoking Cigarettes.  She has been smoking about 0.25 packs per day. She uses smokeless tobacco. She reports that she does not drink alcohol. Her drug history is not on file.  REVIEW Of SYSTEMS:  History of hyperlipidemia:  Lab Results  Component Value Date   CHOL 202* 12/04/2013   HDL 49.90 12/04/2013   LDLCALC 132* 12/04/2013   TRIG 101.0 12/04/2013   CHOLHDL 4 12/04/2013     She has not established with a PCP for general care but is trying to get someone close to her home   Examination:   BP 102/70 mmHg  Temp(Src) 98.5 F (36.9 C) (Oral)  Ht 5\' 1"  (1.549 m)  Wt 116 lb 12.8 oz (52.98 kg)  BMI 22.08 kg/m2  She looks well Heartrate 72, regular  NEUROLOGIC EXAM:  biceps reflexes show slightly brisk relaxation     Assessment   Hypothyroidism, post ablative  Her TSH is back to normal with reducing her dose to 75 g, most likely has some active areas in her multinodular goiter requiring a dosage reduction She was not having any symptoms with a suppressed TSH previously Has been very compliant with the dose daily in the mornings   Treatment:  Continue 75 g levothyroxine Follow-up in 6 months If TSH is normal on follow-up she will follow-up in another year   [facility] 01/16/2015, 4:06 PM

## 2015-02-05 DIAGNOSIS — R140 Abdominal distension (gaseous): Secondary | ICD-10-CM | POA: Diagnosis not present

## 2015-02-05 DIAGNOSIS — K219 Gastro-esophageal reflux disease without esophagitis: Secondary | ICD-10-CM | POA: Diagnosis not present

## 2015-02-05 DIAGNOSIS — K580 Irritable bowel syndrome with diarrhea: Secondary | ICD-10-CM | POA: Diagnosis not present

## 2015-03-05 ENCOUNTER — Other Ambulatory Visit: Payer: Self-pay | Admitting: Endocrinology

## 2015-03-13 ENCOUNTER — Other Ambulatory Visit: Payer: Self-pay | Admitting: Endocrinology

## 2015-04-01 ENCOUNTER — Telehealth: Payer: Self-pay | Admitting: Cardiology

## 2015-04-01 MED ORDER — METOPROLOL TARTRATE 25 MG PO TABS: ORAL_TABLET | ORAL | Status: DC

## 2015-04-01 NOTE — Telephone Encounter (Signed)
°*  STAT* If patient is at the pharmacy, call can be transferred to refill team.   1. Which medications need to be refilled? (please list name of each medication and dose if known) Metoprolol Tartrate  2. Which pharmacy/location (including street and city if local pharmacy) is medication to be sent to?[place](partial) [facility](90)   3. Do they need a 30 day or 90 day supply? A partial and 90 day  *PT'S DOSAGE WAS CHANGED TO TAKE 1 1/2 TAB BID DAILY*

## 2015-04-01 NOTE — Telephone Encounter (Signed)
Rx(s) sent to pharmacy electronically - local & mail order pharmacy

## 2015-04-18 ENCOUNTER — Other Ambulatory Visit: Payer: Self-pay | Admitting: Cardiology

## 2015-06-08 ENCOUNTER — Other Ambulatory Visit: Payer: Self-pay | Admitting: Cardiology

## 2015-06-08 NOTE — Telephone Encounter (Signed)
Rx(s) sent to pharmacy electronically.  

## 2015-07-13 ENCOUNTER — Other Ambulatory Visit (INDEPENDENT_AMBULATORY_CARE_PROVIDER_SITE_OTHER): Payer: Medicare Other

## 2015-07-13 DIAGNOSIS — E042 Nontoxic multinodular goiter: Secondary | ICD-10-CM | POA: Diagnosis not present

## 2015-07-13 LAB — TSH: TSH: 1.2 u[IU]/mL (ref 0.35–4.50)

## 2015-07-13 LAB — T4, FREE: Free T4: 1.07 ng/dL (ref 0.60–1.60)

## 2015-07-16 ENCOUNTER — Ambulatory Visit (INDEPENDENT_AMBULATORY_CARE_PROVIDER_SITE_OTHER): Payer: Medicare Other | Admitting: Endocrinology

## 2015-07-16 ENCOUNTER — Encounter: Payer: Self-pay | Admitting: Endocrinology

## 2015-07-16 VITALS — BP 110/62 | HR 93 | Ht 61.0 in | Wt 116.0 lb

## 2015-07-16 DIAGNOSIS — E890 Postprocedural hypothyroidism: Secondary | ICD-10-CM

## 2015-07-16 NOTE — Progress Notes (Signed)
Patient ID: Dominique Barber, female   DOB: 1955/07/14, 60 y.o.   MRN: [id number]   Reason for Appointment:  Hypothyroidism, followup visit    History of Present Illness:   The hypothyroidism was first diagnosed in 1999 after radioactive iodine treatment for toxic nodular goiter Subsequently she became hypothyroid and has been on thyroid supplementation She has generally been on stable dose of Synthroid  On her follow-up visit in 11/16 her TSH was lower than usual at 0.13 Her dose was reduced from 100 down to 75 g     Subsequently TSH has been normal Patient has no complaints of unusual fatigue   Her weight is stable            The patient is taking the thyroid supplement very consistently in the morning before breakfast.  Not taking any calcium or iron supplements  In the morning   TSH levels as below  Lab Results  Component Value Date   FREET4 1.07 07/13/2015   FREET4 1.12 01/14/2015   FREET4 1.49 12/03/2014   TSH 1.20 07/13/2015   TSH 1.53 01/14/2015   TSH 0.13* 12/02/2014    GOITER: She apparently has had a multinodular goiter since her 49s. Details of this are also not available at present. However she thinks she has had a biopsy in the past also Does not feel any local pressure or choking sensation Her thyroid is larger on the left lobe      Medication List       This list is accurate as of: 07/16/15  9:26 PM.  Always use your most recent med list.               albuterol 108 (90 Base) MCG/ACT inhaler  Commonly known as:  PROVENTIL HFA;VENTOLIN HFA  Inhale 1-2 puffs into the lungs every 6 (six) hours as needed for wheezing or shortness of breath.     ALIGN PO  Take by mouth daily.     CALCIUM PO  Take 1 tablet by mouth daily.     cetirizine 10 MG tablet  Commonly known as:  ZYRTEC  TAKE 1 TABLET DAILY     hyoscyamine 0.125 MG tablet  Commonly known as:  LEVSIN, ANASPAZ  Take 0.125 mg by mouth 2 (two) times daily.     SYMAX-SR  0.375 MG 12 hr tablet  Generic drug:  hyoscyamine     INTEGRA PO  Take 1 tablet by mouth daily. Reported on 07/16/2015     levothyroxine 50 MCG tablet  Commonly known as:  SYNTHROID, LEVOTHROID  TAKE ONE AND ONE-HALF (1 & 1/2) TABLETS DAILY     metoprolol tartrate 25 MG tablet  Commonly known as:  LOPRESSOR  Take 1.5 tablets (37.5mg ) by mouth twice daily.     potassium chloride 10 MEQ tablet  Commonly known as:  KLOR-CON 10  Take 1 tablet (10 mEq total) by mouth 2 (two) times daily. PLEASE CONTACT OFFICE FOR ADDITIONAL REFILLS     TUSSIN PO  Take 30 mLs by mouth every 4 (four) hours as needed. For cough     XARELTO 20 MG Tabs tablet  Generic drug:  rivaroxaban  TAKE 1 TABLET DAILY WITH SUPPER        Allergies:  Allergies  Allergen Reactions  . Iohexol Hives and Rash    Red dye    Past Medical History  Diagnosis Date  . ST elevation (STEMI) myocardial infarction involving left anterior descending coronary artery ([place])  01/19/1996    Calculated by VF arrest, and anoxic brain injury with status epilepticus; POBA of LAD  . CAD in native artery 1998    Followup 1 month post MI revealed 95% ISR in PTCA site --> PCI with 3.0 mm x 25 mm BMS stent in proximal LAD --> 5 months later recurrent ISR of LAD BMS that compromised D1 --> referred for CABG  . S/P CABG x 04 July 1996    Initially LIMA-LAD, SVG-D1 --> immediate LIMA failure post CABG with anterior MI --> urgent redo SVG-LAD beyond D2.; Widely patent grafts as of 2005 with left dominant system. Graft to the diagonal branch has a very small target vessel but the vein graft to LAD retrograde fills a large bifurcating D2 and antegrade fills a large D3.  Marland Kitchen Hyperlipidemia   . COPD (chronic obstructive pulmonary disease) ([place])   . Tobacco abuse   . Irritable bowel syndrome     Followed by Dr. Juanita Craver.  Marland Kitchen GERD (gastroesophageal reflux disease)   . Hypothyroidism     On Synthroid  . Iron deficiency anemia   . PAF (paroxysmal  atrial fibrillation) ([place]) 05/03/2014    New onset - originally with RVR    Past Surgical History  Procedure Laterality Date  . Appendectomy    . Cesarean section    . Cholecystectomy    . Coronary angioplasty  01/19/1996    acute anterior wall MI, anoxic encephalopahty, VF; LCfx free of disease and dominant, RCA patent, L main short and patent, LAD with 70-80% narrowing and focal 95% stenosis in distal 3rd with balloon angioplasty (Dr. Marella Chimes)  . Coronary angioplasty with stent placement  02/26/1996    3.0x63mm Multi-Link stent to prox/mid LAD; L main normal & short, Cfx dominant and normal, PDA & PLA normal, RCA non-dominant with small PLA and normal (Dr. Marella Chimes)  . Cardiac catheterization  07/30/1996    normal L main, LAD w/95% stenosis just before stent, diffuse 80-85% end-stent restenosis, 1st diagonal with70-75% ostial stenosis, large optional diagonal that was normal, Cfx with 2 marginals and distal PLA (all normal), RCA normal (Dr. Marella Chimes)  . Cardiac catheterization  01/31/2003    LAD totally occluded in prox 3rd, patent Cfx, SVG to mid LAd patent, SVG to small DX1 patent, LIMA to LAD was atretic and essentially occluded in junction of prox & mid 3rd, R barciocephalic and subclavian were normal (Dr. Marella Chimes)  . Coronary artery bypass graft  07/31/1996    x2; LIMA to LAD and SVG to 1st diagonal (Dr. Remus Loffler)  . Coronary artery bypass graft  07/31/1996    x1; SVG to distal LAD (Dr. Remus Loffler)  . Nm myocar perf wall motion  10/2010; 06/2014    a) LexiScan Cardiolite - mild perfusion defect r/t infarct/scar with mild periifarct ischemia in mid anterior & apical anterior region; EF 67%; abnormal, low risk study;; b) Small, moderate intensity Fixed perfusion defect - mid Anterior Wall c/w known Anterior MI. LOW RISK   . Transthoracic echocardiogram  02/2012; 06/2014    a) EF 50-55%, mild HK of anterospetal myocardium; mild MR;; b) EF 55-60%, mild HK of apical anterior wall.    Mild MR    Family History  Problem Relation Age of Onset  . Heart failure Other   . Diabetes Other   . Diabetes Mother   . Thyroid disease Paternal Aunt   . Thyroid disease Maternal Grandmother     Social History:  reports  that she has been smoking Cigarettes.  She has been smoking about 0.25 packs per day. She uses smokeless tobacco. She reports that she does not drink alcohol. Her drug history is not on file.  REVIEW Of SYSTEMS:  History of hyperlipidemia:   she has had CAD but apparently not on any statin drug.  Is due to see a cardiologist  Lab Results  Component Value Date   CHOL 202* 12/04/2013   HDL 49.90 12/04/2013   LDLCALC 132* 12/04/2013   TRIG 101.0 12/04/2013   CHOLHDL 4 12/04/2013       Examination:   BP 110/62 mmHg  Pulse 93  Ht 5\' 1"  (1.549 m)  Wt 116 lb (52.617 kg)  BMI 21.93 kg/m2  SpO2 96%  She looks well  Thyroid palpable mostly on swallowing on the left side, rounded and smooth, about 2.5 Times normal.  Right lobe just palpable NEUROLOGIC EXAM:  biceps reflexes show slightly brisk relaxation     Assessment   Hypothyroidism, post ablative  Her TSH is back to normal  Consistently with reducing her dose to 75 g  subjectively doing well Has been compliant with the dose daily in the mornings    MULTINODULAR goiter: Long-standing and stable clinically   Treatment:  Continue 75 g levothyroxine  And follow-up annually   She will discuss hyperlipidemia with cardiologist    [facility] 07/16/2015, 9:26 PM

## 2015-07-16 NOTE — Patient Instructions (Signed)
New Rx will 75ug 1x daily  Check cholesterol

## 2015-07-19 ENCOUNTER — Other Ambulatory Visit: Payer: Self-pay | Admitting: Cardiology

## 2015-08-06 ENCOUNTER — Ambulatory Visit: Payer: Medicare Other | Admitting: Cardiology

## 2015-09-01 ENCOUNTER — Other Ambulatory Visit: Payer: Self-pay | Admitting: Endocrinology

## 2015-09-07 ENCOUNTER — Other Ambulatory Visit: Payer: Self-pay | Admitting: Cardiology

## 2015-09-09 ENCOUNTER — Other Ambulatory Visit: Payer: Self-pay | Admitting: Endocrinology

## 2015-09-28 ENCOUNTER — Other Ambulatory Visit: Payer: Self-pay | Admitting: Cardiology

## 2015-10-05 ENCOUNTER — Ambulatory Visit (INDEPENDENT_AMBULATORY_CARE_PROVIDER_SITE_OTHER): Payer: Medicare Other | Admitting: Cardiology

## 2015-10-05 ENCOUNTER — Encounter: Payer: Self-pay | Admitting: Cardiology

## 2015-10-05 VITALS — BP 110/78 | HR 57 | Ht 61.0 in | Wt 117.8 lb

## 2015-10-05 DIAGNOSIS — Z951 Presence of aortocoronary bypass graft: Secondary | ICD-10-CM

## 2015-10-05 DIAGNOSIS — Z720 Tobacco use: Secondary | ICD-10-CM

## 2015-10-05 DIAGNOSIS — I2102 ST elevation (STEMI) myocardial infarction involving left anterior descending coronary artery: Secondary | ICD-10-CM | POA: Diagnosis not present

## 2015-10-05 DIAGNOSIS — I480 Paroxysmal atrial fibrillation: Secondary | ICD-10-CM

## 2015-10-05 DIAGNOSIS — I2510 Atherosclerotic heart disease of native coronary artery without angina pectoris: Secondary | ICD-10-CM | POA: Diagnosis not present

## 2015-10-05 DIAGNOSIS — E785 Hyperlipidemia, unspecified: Secondary | ICD-10-CM | POA: Diagnosis not present

## 2015-10-05 DIAGNOSIS — I2109 ST elevation (STEMI) myocardial infarction involving other coronary artery of anterior wall: Secondary | ICD-10-CM

## 2015-10-05 NOTE — Patient Instructions (Signed)
NO CHANGE WITH CURRENT MEDICATIONS   Your physician wants you to follow-up in 6 MONTHS WITH DR HARDING.  You will receive a reminder letter in the mail two months in advance. If you don't receive a letter, please call our office to schedule the follow-up appointment.  If you need a refill on your cardiac medications before your next appointment, please call your pharmacy.   

## 2015-10-05 NOTE — Progress Notes (Signed)
PCP: No primary care provider on file.  Clinic Note: Chief Complaint  Patient presents with  . Follow-up    CAD, [name]    HPI: Dominique Barber is a 60 y.o. female with a PMH below who presents today for 4 month f/u of CAD-CABG, PAF & to discuss results of Myoview & Echo ordered @ last visit to evaluate for possible ischemic etiology for [name].  Dominique Barber was last seen on May 11 2013 as a post-hospital f/u for Afib RVR (rate controlled, Xarelto -- spontaneously converted to NSR). Past Cardiac History:  Anterior STEMI [id number]: complicated by VF arrest and mild anoxic brain injury following decompensation/shock  She was finally treated with a angioplasty of the LAD at that time.   Shortly thereafter she underwent CABG with the LIMA-LAD and SVG-D1. -   She then had a Complication when the LIMA graft and suffered another anterior MI and underwent emergent SVG-LAD bypass.   Her last cardiac catheterization was in 2005 with no significant change in disease with patent grafts..  Myoview June 2016 - Small mid anterior wall scar without ischemia. Low risk study  By her report, since her CABG she has been troubled with IBS  Afib - April 2016 - now on Xarelto.  Recent Hospitalizations:None  Studies Reviewed: PSH updated.  No new studies  Interval History: Overall, she is doing pretty well from a cardiac standpoint. No routine chest tightness or pressure with rest or exertion. No real exertional dyspnea unless she overdoes it.  She says this has infrequent episodes lasting maybe 1 or 2 minutes of increased heart rate. Sheat this usually occurs at night when she lies down to sleep. When she felt her heart rate going fast, she denied any sensation of irregularity of the rhythm. Nothing to suggest her symptoms like the atrial fibrillation admission. Urinalysis and feels a sensation of chest tightness when she is out in the heat doing any activity that vigorous. No PND,  orthopnea or edema.  No lightheadedness, dizziness, weakness or syncope/near syncope. No TIA/amaurosis fugax symptoms. No melena, hematochezia, hematuria, or epstaxis. No claudication. Most notably she is having some GI symptoms of nausea and vomiting intermittently.    Past Medical History:  Diagnosis Date  . CAD in native artery 1998   Followup 1 month post MI revealed 95% ISR in PTCA site --> PCI with 3.0 mm x 25 mm BMS stent in proximal LAD --> 5 months later recurrent ISR of LAD BMS that compromised D1 --> referred for CABG  . COPD (chronic obstructive pulmonary disease) ([place])   . GERD (gastroesophageal reflux disease)   . Hyperlipidemia   . Hypothyroidism    On Synthroid  . Iron deficiency anemia   . Irritable bowel syndrome    Followed by Dr. Juanita Craver.  Marland Kitchen PAF (paroxysmal atrial fibrillation) ([place]) 05/03/2014   New onset - originally with RVR  . S/P CABG x 04 July 1996   Initially LIMA-LAD, SVG-D1 --> immediate LIMA failure post CABG with anterior MI --> urgent redo SVG-LAD beyond D2.; Widely patent grafts as of 2005 with left dominant system. Graft to the diagonal branch has a very small target vessel but the vein graft to LAD retrograde fills a large bifurcating D2 and antegrade fills a large D3.  . ST elevation (STEMI) myocardial infarction involving left anterior descending coronary artery ([place])  01/19/1996   Calculated by VF arrest, and anoxic brain injury with status epilepticus; POBA of LAD  .  Tobacco abuse     Past Surgical History:  Procedure Laterality Date  . APPENDECTOMY    . CARDIAC CATHETERIZATION  07/30/1996   normal L main, LAD w/95% stenosis just before stent, diffuse 80-85% end-stent restenosis, 1st diagonal with70-75% ostial stenosis, large optional diagonal that was normal, Cfx with 2 marginals and distal PLA (all normal), RCA normal (Dr. Marella Chimes)  . CARDIAC CATHETERIZATION  01/31/2003   LAD totally occluded in prox 3rd, patent Cfx, SVG to mid LAd  patent, SVG to small DX1 patent, LIMA to LAD was atretic and essentially occluded in junction of prox & mid 3rd, R barciocephalic and subclavian were normal (Dr. Marella Chimes)  . CESAREAN SECTION    . CHOLECYSTECTOMY    . CORONARY ANGIOPLASTY  01/19/1996   acute anterior wall MI, anoxic encephalopahty, VF; LCfx free of disease and dominant, RCA patent, L main short and patent, LAD with 70-80% narrowing and focal 95% stenosis in distal 3rd with balloon angioplasty (Dr. Marella Chimes)  . CORONARY ANGIOPLASTY WITH STENT PLACEMENT  02/26/1996   3.0x65mm Multi-Link stent to prox/mid LAD; L main normal & short, Cfx dominant and normal, PDA & PLA normal, RCA non-dominant with small PLA and normal (Dr. Marella Chimes)  . CORONARY ARTERY BYPASS GRAFT  07/31/1996   x2; LIMA to LAD and SVG to 1st diagonal (Dr. Remus Loffler)  . CORONARY ARTERY BYPASS GRAFT  07/31/1996   x1; SVG to distal LAD (Dr. Remus Loffler)  . NM MYOCAR PERF WALL MOTION  10/2010; 06/2014   a) LexiScan Cardiolite - mild perfusion defect r/t infarct/scar with mild periifarct ischemia in mid anterior & apical anterior region; EF 67%; abnormal, low risk study;; b) Small, moderate intensity Fixed perfusion defect - mid Anterior Wall c/w known Anterior MI. LOW RISK   . TRANSTHORACIC ECHOCARDIOGRAM  02/2012; 06/2014   a) EF 50-55%, mild HK of anterospetal myocardium; mild MR;; b) EF 55-60%, mild HK of apical anterior wall.   Mild MR    ROS: A comprehensive was performed. Review of Systems  Constitutional: Negative for malaise/fatigue.  HENT: Negative for nosebleeds.        - now has dentures  Respiratory: Negative for cough and wheezing.   Cardiovascular: Positive for leg swelling (pretty good - but notes it in the summer heat more than now ). Negative for claudication.       Per history of present illness  Gastrointestinal: Positive for abdominal pain, constipation and diarrhea. Negative for blood in stool.       IBS symptoms  Genitourinary: Negative for  hematuria.  Musculoskeletal: Positive for joint pain (right shoulder pain).  Neurological: Positive for dizziness (Sometimes positional, sometimes if she feels her heart racing following exertion). Negative for focal weakness and headaches.  Endo/Heme/Allergies: Positive for environmental allergies. Does not bruise/bleed easily.  Psychiatric/Behavioral: Negative for depression. The patient is nervous/anxious.   All other systems reviewed and are negative.   Prior to Admission medications   Medication Sig Start Date End Date Taking? Authorizing Provider  albuterol (PROVENTIL HFA;VENTOLIN HFA) 108 (90 BASE) MCG/ACT inhaler Inhale 1-2 puffs into the lungs every 6 (six) hours as needed for wheezing or shortness of breath. 05/05/14  Yes Modena Jansky, MD  CALCIUM PO Take 1 tablet by mouth daily.    Yes Historical Provider, MD  cetirizine (ZYRTEC) 10 MG tablet TAKE 1 TABLET DAILY 09/01/15  Yes Elayne Snare, MD  Fe Fum-FePoly-Vit C-Vit B3 (INTEGRA PO) Take 1 tablet by mouth daily. Reported on 07/16/2015  Yes Historical Provider, MD  GuaiFENesin (TUSSIN PO) Take 30 mLs by mouth every 4 (four) hours as needed. For cough   Yes Historical Provider, MD  hyoscyamine (LEVSIN, ANASPAZ) 0.125 MG tablet Take 0.125 mg by mouth 2 (two) times daily.    Yes Historical Provider, MD  KLOR-CON 10 10 MEQ tablet TAKE 1 TABLET TWICE A DAY (PLEASE CONTACT OFFICE FOR ADDITIONAL REFILLS) 09/08/15  Yes Leonie Man, MD  levothyroxine (SYNTHROID, LEVOTHROID) 50 MCG tablet TAKE ONE AND ONE-HALF (1 & 1/2) TABLETS DAILY 09/09/15  Yes Elayne Snare, MD  metoprolol tartrate (LOPRESSOR) 25 MG tablet Take 1.5 tablets (37.5mg ) by mouth twice daily. 04/01/15  Yes Leonie Man, MD  metoprolol tartrate (LOPRESSOR) 25 MG tablet TAKE ONE AND ONE-HALF TABLETS TWICE A DAY 09/28/15  Yes Leonie Man, MD  Probiotic Product (ALIGN PO) Take by mouth daily.   Yes Historical Provider, MD  Probiotic Product (ALIGN PO) Take by mouth.   Yes Historical  Provider, MD  SYMAX-SR 0.375 MG 12 hr tablet  12/09/14  Yes Historical Provider, MD  XARELTO 20 MG TABS tablet TAKE 1 TABLET DAILY WITH SUPPER 07/20/15  Yes Leonie Man, MD    Allergies  Allergen Reactions  . Iohexol Hives and Rash    Red dye  . Viberzi [Eluxadoline] Nausea And Vomiting    Social History   Social History  . Marital status: Married    Spouse name: N/A  . Number of children: 3  . Years of education: N/A   Occupational History  .  Disabled   Social History Main Topics  . Smoking status: Current Every Day Smoker    Packs/day: 0.25    Types: Cigarettes  . Smokeless tobacco: Current User     Comment: Quit at time of MI, restarted in 2009  . Alcohol use No  . Drug use: Unknown  . Sexual activity: Not Asked   Other Topics Concern  . None   Social History Narrative   Married, mother of 3 children (2 living sons age 17-26 and 30-31; her daughter was the victim of a murder that took place sometime around the time of the patient's MI. She was under significant amount of stress as her daughter had gone missing. She was never found. Finally the suspect was charged and convicted in 2009. She had sessile he stop smoking until that time frame when it brought back memories and she is now back to smoking a half pack a day.   She does not get routine exercise, but is active around the house and doing housecleaning chores.   She does not drink alcohol.    Family History  Problem Relation Age of Onset  . Heart failure Other   . Diabetes Other   . Diabetes Mother   . Thyroid disease Paternal Aunt   . Thyroid disease Maternal Grandmother     Wt Readings from Last 3 Encounters:  10/05/15 53.4 kg (117 lb 12.8 oz)  07/16/15 52.6 kg (116 lb)  01/16/15 53 kg (116 lb 12.8 oz)    PHYSICAL EXAM BP 110/78 (BP Location: Right Arm, Patient Position: Sitting, Cuff Size: Normal)   Pulse (!) 57   Ht 5\' 1"  (1.549 m)   Wt 53.4 kg (117 lb 12.8 oz)   SpO2 97%   BMI 22.26 kg/m   General appearance: alert, cooperative, appears stated age, no distress, mildly obese and  Psych: Anxious appearing, but answers questions properly. Somewhat subdued affect Neck: no adenopathy, no  carotid bruit, no JVD and supple, symmetrical, trachea midline; Small palpable nodule noted in the left thyroid gland. Lungs: clear to auscultation bilaterally, normal percussion bilaterally and Nonlabored, good air movement with mild interstitial sounds Heart: RRR with normal S1 and S2. Nondisplaced PMI. Very early peaking 1-2/6 SEM at [place]. No other M./R./G. Abdomen: soft, non-tender; bowel sounds normal; no masses, no organomegaly Extremities: extremities normal, atraumatic, no cyanosis or edema Pulses: 2+ and symmetric Skin: Skin color, texture, turgor normal. No rashes or lesions or Well-healed sternotomy scar and venous graft section sites. Neurologic: Alert and oriented X 3, normal strength and tone. Normal symmetric reflexes. Normal coordination and gait CN II-12 grossly intact    Adult ECG Report  Sinus bradycardia, rate 57 BPM. Anterior MI, age undetermined.  Relatively stable EKG with otherwise normal axis, intervals and durations.  Additional studies/ records that were reviewed today include:  Recent Labs:  No recent lipids. Chemistries from May 2016 reviewed in [facility] [facility]. No abnormalities.   ASSESSMENT / PLAN: Problem List Items Addressed This Visit    Tobacco abuse (Chronic)    She is not in the condylar stage for smoking cessation. I spoke briefly with her about stopping, but she again did not show signs of interest.      ST elevation (STEMI) myocardial infarction involving left anterior descending coronary artery (HCC) (Chronic)    She did have a small moderate defect noted on Myoview system with known anterior MI. No peri-infarct ischemia. Preserved EF. The fixed defect is actually corroborated by echocardiographic evidence of LAD wall motion abnormality. No angina or heart failure  symptoms on minimal medications.      Relevant Orders   EKG 12-Lead (Completed)   Lipid panel   Comprehensive metabolic panel   S/P CABG x 2 (Chronic)   Paroxysmal atrial fibrillation ([place]); CHA2DS2-VASc Score 3. On Xarelto (Chronic)    Remains in sinus rhythm on low-dose beta blocker. Rate controlled with no true evidence of recurrence. We discussed potential when necessary additional dosing of beta blocker. Remains on anticoagulation with Xarelto. No bleeding issues.      Relevant Orders   EKG 12-Lead (Completed)   Lipid panel   Comprehensive metabolic panel   Hyperlipidemia with target LDL less than 70 (Chronic)    Lipids continue to monitored by her PCP. Unfortunately I don't have results. Not currently on any medication for statin.  In the absence of any recent labs, we will go ahead and check a chemistry panel and lipid panel.      Relevant Orders   EKG 12-Lead (Completed)   Lipid panel   Comprehensive metabolic panel   CAD- [id number] LAD, s/p SVG-LAD after failed LIMA-LAD - Primary (Chronic)    No real recurrence of true anginal chest pain. Last cath in 2005 showed an occluded LAD with patent LIMA. Myoview in June 2016 showed a fixed anterior defect consistent with known anterior MI. She is tolerating her current dose of medication without any significant symptoms. Plan: Continue current dose of beta blocker. She is not on aspirin or Plavix because she is on Xarelto. Not on statin due to intolerance.       Other Visit Diagnoses   None.     Current medicines are reviewed at length with the patient today. (+/- concerns) none The following changes have been made: none   Studies Ordered:   Orders Placed This Encounter  Procedures  . Lipid panel  . Comprehensive metabolic panel  . EKG 12-Lead  follow-up appointment in 6 month with Dr Sanjuana Kava, M.D., M.S. Interventional Cardiologist   Pager # [phone]

## 2015-10-13 ENCOUNTER — Encounter: Payer: Self-pay | Admitting: Cardiology

## 2015-10-13 NOTE — Assessment & Plan Note (Signed)
She did have a small moderate defect noted on Myoview system with known anterior MI. No peri-infarct ischemia. Preserved EF. The fixed defect is actually corroborated by echocardiographic evidence of LAD wall motion abnormality. No angina or heart failure symptoms on minimal medications.

## 2015-10-13 NOTE — Assessment & Plan Note (Signed)
Remains in sinus rhythm on low-dose beta blocker. Rate controlled with no true evidence of recurrence. We discussed potential when necessary additional dosing of beta blocker. Remains on anticoagulation with Xarelto. No bleeding issues.

## 2015-10-13 NOTE — Assessment & Plan Note (Addendum)
Lipids continue to monitored by her PCP. Unfortunately I don't have results. Not currently on any medication for statin.  In the absence of any recent labs, we will go ahead and check a chemistry panel and lipid panel.

## 2015-10-13 NOTE — Assessment & Plan Note (Signed)
No real recurrence of true anginal chest pain. Last cath in 2005 showed an occluded LAD with patent LIMA. Myoview in June 2016 showed a fixed anterior defect consistent with known anterior MI. She is tolerating her current dose of medication without any significant symptoms. Plan: Continue current dose of beta blocker. She is not on aspirin or Plavix because she is on Xarelto. Not on statin due to intolerance.

## 2015-10-13 NOTE — Assessment & Plan Note (Signed)
She is not in the condylar stage for smoking cessation. I spoke briefly with her about stopping, but she again did not show signs of interest.

## 2015-10-16 DIAGNOSIS — I2102 ST elevation (STEMI) myocardial infarction involving left anterior descending coronary artery: Secondary | ICD-10-CM | POA: Diagnosis not present

## 2015-10-16 DIAGNOSIS — I480 Paroxysmal atrial fibrillation: Secondary | ICD-10-CM | POA: Diagnosis not present

## 2015-10-16 DIAGNOSIS — E785 Hyperlipidemia, unspecified: Secondary | ICD-10-CM | POA: Diagnosis not present

## 2015-10-16 LAB — COMPREHENSIVE METABOLIC PANEL WITH GFR
ALT: 14 U/L (ref 6–29)
AST: 14 U/L (ref 10–35)
Albumin: 4.1 g/dL (ref 3.6–5.1)
Alkaline Phosphatase: 65 U/L (ref 33–130)
BUN: 15 mg/dL (ref 7–25)
CO2: 24 mmol/L (ref 20–31)
Calcium: 9.7 mg/dL (ref 8.6–10.4)
Chloride: 106 mmol/L (ref 98–110)
Creat: 0.83 mg/dL (ref 0.50–0.99)
Glucose, Bld: 90 mg/dL (ref 65–99)
Potassium: 4.8 mmol/L (ref 3.5–5.3)
Sodium: 142 mmol/L (ref 135–146)
Total Bilirubin: 0.4 mg/dL (ref 0.2–1.2)
Total Protein: 6.7 g/dL (ref 6.1–8.1)

## 2015-10-16 LAB — LIPID PANEL
Cholesterol: 209 mg/dL — ABNORMAL HIGH (ref 125–200)
HDL: 53 mg/dL (ref >=46)
LDL Cholesterol: 125 mg/dL (ref <130)
Total CHOL/HDL Ratio: 3.9 Ratio (ref <=5.0)
Triglycerides: 155 mg/dL — ABNORMAL HIGH (ref <150)
VLDL: 31 mg/dL — ABNORMAL HIGH (ref <30)

## 2015-11-06 ENCOUNTER — Telehealth: Payer: Self-pay | Admitting: *Deleted

## 2015-11-06 MED ORDER — ATORVASTATIN CALCIUM 40 MG PO TABS: 40.0000 mg | ORAL_TABLET | Freq: Every day | ORAL | 3 refills | Status: DC

## 2015-11-06 NOTE — Telephone Encounter (Signed)
-----   Message from Leonie Man, MD sent at 10/20/2015  6:12 PM EDT ----- Normal chemistries with normal kidney and liver function  Cholesterol levels are stable with one year ago, but definitely not compared to 3 years ago is well-controlled. Total cholesterol stable at 209, triglycerides just above normal at 155 and LDL slightly better at 125. She is not currently on a statin. I would try atorvastatin 40 mg daily. Recheck labs in 6 months.   Glenetta Hew, MD

## 2015-11-06 NOTE — Telephone Encounter (Signed)
Spoke to patient.  Lab Result given. E- sent rx to mail order as requested . Request a copy to be sent to GI-Dr Collene Mares.  Verbalized understanding.  Routed labs to Dr Collene Mares

## 2015-11-24 DIAGNOSIS — K580 Irritable bowel syndrome with diarrhea: Secondary | ICD-10-CM | POA: Diagnosis not present

## 2015-11-24 DIAGNOSIS — K219 Gastro-esophageal reflux disease without esophagitis: Secondary | ICD-10-CM | POA: Diagnosis not present

## 2015-11-24 DIAGNOSIS — R143 Flatulence: Secondary | ICD-10-CM | POA: Diagnosis not present

## 2015-11-24 DIAGNOSIS — Z86010 Personal history of colonic polyps: Secondary | ICD-10-CM | POA: Diagnosis not present

## 2015-12-09 ENCOUNTER — Other Ambulatory Visit: Payer: Self-pay | Admitting: Cardiology

## 2015-12-10 NOTE — Telephone Encounter (Signed)
Rx(s) sent to pharmacy electronically.  

## 2016-01-16 ENCOUNTER — Other Ambulatory Visit: Payer: Self-pay | Admitting: Cardiology

## 2016-01-28 ENCOUNTER — Telehealth: Payer: Self-pay | Admitting: Cardiology

## 2016-01-28 NOTE — Telephone Encounter (Signed)
New message ° ° ° ° °Returning a call to the nurse °

## 2016-01-28 NOTE — Telephone Encounter (Signed)
Spoke to patient  states saw phone number on caller id . No message left.  not sure who called patient  recall appointment  Schedule for 03/2016.  patient states she had not been taking atorvastatin - " I forgot " informed patient restart medication   verbalized understanding

## 2016-02-18 ENCOUNTER — Telehealth: Payer: Self-pay | Admitting: Cardiology

## 2016-02-18 NOTE — Telephone Encounter (Signed)
New message   Pt c/o medication issue:  1. Name of Medication: atorvastatin 40 mg  2. How are you currently taking this medication (dosage and times per day)? Once a day at bedtime  3. Are you having a reaction (difficulty breathing--STAT)? no  4. What is your medication issue? Pt is not sure if it's from this medication. But she has constipation, gas and heartburn. Pt noticed these things yesterday.

## 2016-02-18 NOTE — Telephone Encounter (Signed)
Spoke with pt, she restarted her atorvastatin 2 weeks ago. Last night and this morning she has had gas in the upper part of her stomach that she has not been able to "break."  She has IBS and usually has diarrhea related to this diagnosis but has only had one BM today so far. Explained not sure the symptoms are related to atorvastatin but she was given the okay to stop the atorvastatin for several days to see if that helps with her symptoms. Patient voiced understanding to follow up with her GI doctor regarding her IBS if stopping atorvastatin does not improve her issues.

## 2016-02-28 ENCOUNTER — Other Ambulatory Visit: Payer: Self-pay | Admitting: Endocrinology

## 2016-02-29 ENCOUNTER — Other Ambulatory Visit: Payer: Self-pay

## 2016-03-01 ENCOUNTER — Other Ambulatory Visit: Payer: Self-pay

## 2016-03-01 MED ORDER — CETIRIZINE HCL 10 MG PO TABS: 10.0000 mg | ORAL_TABLET | Freq: Every day | ORAL | 1 refills | Status: DC

## 2016-03-07 ENCOUNTER — Other Ambulatory Visit: Payer: Self-pay | Admitting: Endocrinology

## 2016-03-24 ENCOUNTER — Ambulatory Visit (INDEPENDENT_AMBULATORY_CARE_PROVIDER_SITE_OTHER): Payer: Medicare Other | Admitting: Cardiology

## 2016-03-24 ENCOUNTER — Encounter: Payer: Self-pay | Admitting: Cardiology

## 2016-03-24 VITALS — BP 134/72 | HR 60 | Ht 62.0 in | Wt 125.6 lb

## 2016-03-24 DIAGNOSIS — Z951 Presence of aortocoronary bypass graft: Secondary | ICD-10-CM | POA: Diagnosis not present

## 2016-03-24 DIAGNOSIS — F17210 Nicotine dependence, cigarettes, uncomplicated: Secondary | ICD-10-CM

## 2016-03-24 DIAGNOSIS — E785 Hyperlipidemia, unspecified: Secondary | ICD-10-CM | POA: Diagnosis not present

## 2016-03-24 DIAGNOSIS — I2102 ST elevation (STEMI) myocardial infarction involving left anterior descending coronary artery: Secondary | ICD-10-CM | POA: Diagnosis not present

## 2016-03-24 DIAGNOSIS — I480 Paroxysmal atrial fibrillation: Secondary | ICD-10-CM | POA: Diagnosis not present

## 2016-03-24 DIAGNOSIS — I2109 ST elevation (STEMI) myocardial infarction involving other coronary artery of anterior wall: Secondary | ICD-10-CM

## 2016-03-24 DIAGNOSIS — Z720 Tobacco use: Secondary | ICD-10-CM | POA: Diagnosis not present

## 2016-03-24 DIAGNOSIS — I2510 Atherosclerotic heart disease of native coronary artery without angina pectoris: Secondary | ICD-10-CM

## 2016-03-24 MED ORDER — ROSUVASTATIN CALCIUM 20 MG PO TABS: 20.0000 mg | ORAL_TABLET | Freq: Every day | ORAL | 3 refills | Status: DC

## 2016-03-24 NOTE — Progress Notes (Signed)
PCP: No primary care provider on file.  Clinic Note: Chief Complaint  Patient presents with  . Follow-up    6 months;   . Coronary Artery Disease    HLD  . Atrial Fibrillation  . Nicotine Dependence    HPI: Dominique Barber is a 61 y.o. female with a PMH below who presents today for 6 month f/u of CAD-CABG, PAF.  He last came in to discuss results of Myoview & Echo ordered @ last visit to evaluate for possible ischemic etiology for [name]. Past Cardiac History:  Anterior STEMI 0962: complicated by VF arrest and mild anoxic brain injury following decompensation/shock  She was finally treated with a angioplasty of the LAD at that time.   Shortly thereafter she underwent CABG with the LIMA-LAD and SVG-D1. -   She then had a Complication when the LIMA graft and suffered another anterior MI and underwent emergent SVG-LAD bypass.   Her last cardiac catheterization was in 2005 with no significant change in disease with patent grafts..  Myoview June 2016 - Small mid anterior wall scar without ischemia. Low risk study  By her report, since her CABG she has been troubled with IBS  Afib - April 2016 - now on Xarelto.   Dominique Barber was last seen on Oct 2017: She was doing very well. Had very few brief episodes of rapid heartbeats but otherwise was doing fine. No anginal symptoms.  Recent Hospitalizations:None  Studies Reviewed:  No new studies besides labs  - Based on her most recent lipids: we tried to start 40 mg atorvastatin (she previously took Lipitor without issues). Lab Results  Component Value Date   CHOL 209 (H) 10/16/2015   HDL 53 10/16/2015   LDLCALC 125 10/16/2015   TRIG 155 (H) 10/16/2015   CHOLHDL 3.9 10/16/2015    Interval History: Overall, she is doing pretty well from a cardiac standpoint. No routine chest tightness or pressure with rest or exertion. No real exertional dyspnea unless she overdoes it. Spends lots of time cleaning house / cares for  dogs, etc.   Few gas related epigastric or lateral CP.  Feels some infrequent heart "skipping", fluttering or fast beats - usually @ night & only last a few seconds - usually occurring @ night when she lies down -- nothing to suggest recurrence of Afib (or at least Afib -RVR).   She tried to take atorvastatin, but noted that she had horrible gas/bloating pain (worse IBS) that resolved within a day of stopping the medicine -- she simply decided to stop it without telling us.  No PND, orthopnea or edema.  No lightheadedness, dizziness, weakness or syncope/near syncope. No TIA/amaurosis fugax symptoms.  No claudication. Most notably she is having some GI symptoms of nausea and vomiting intermittently -- this was made worse with atorvastatin.  She would very much like to stop smoking, but is not really sure how to do it. She is down to maybe a handful cigarettes a day.  Past Medical History:  Diagnosis Date  . CAD in native artery 1998   Followup 1 month post MI revealed 95% ISR in PTCA site --> PCI with 3.0 mm x 25 mm BMS stent in proximal LAD --> 5 months later recurrent ISR of LAD BMS that compromised D1 --> referred for CABG  . COPD (chronic obstructive pulmonary disease) ([place])   . GERD (gastroesophageal reflux disease)   . Hyperlipidemia   . Hypothyroidism    On Synthroid  . Iron  deficiency anemia   . Irritable bowel syndrome    Followed by Dr. Juanita Craver.  Marland Kitchen PAF (paroxysmal atrial fibrillation) ([place]) 05/03/2014   New onset - originally with RVR  . S/P CABG x 04 July 1996   Initially LIMA-LAD, SVG-D1 --> immediate LIMA failure post CABG with anterior MI --> urgent redo SVG-LAD beyond D2.; Widely patent grafts as of 2005 with left dominant system. Graft to the diagonal branch has a very small target vessel but the vein graft to LAD retrograde fills a large bifurcating D2 and antegrade fills a large D3.  . ST elevation (STEMI) myocardial infarction involving left anterior descending  coronary artery ([place])  01/19/1996   Calculated by VF arrest, and anoxic brain injury with status epilepticus; POBA of LAD  . Tobacco abuse     Past Surgical History:  Procedure Laterality Date  . APPENDECTOMY    . CARDIAC CATHETERIZATION  07/30/1996   normal L main, LAD w/95% stenosis just before stent, diffuse 80-85% end-stent restenosis, 1st diagonal with70-75% ostial stenosis, large optional diagonal that was normal, Cfx with 2 marginals and distal PLA (all normal), RCA normal (Dr. Marella Chimes)  . CARDIAC CATHETERIZATION  01/31/2003   LAD totally occluded in prox 3rd, patent Cfx, SVG to mid LAd patent, SVG to small DX1 patent, LIMA to LAD was atretic and essentially occluded in junction of prox & mid 3rd, R barciocephalic and subclavian were normal (Dr. Marella Chimes)  . CESAREAN SECTION    . CHOLECYSTECTOMY    . CORONARY ANGIOPLASTY  01/19/1996   acute anterior wall MI, anoxic encephalopahty, VF; LCfx free of disease and dominant, RCA patent, L main short and patent, LAD with 70-80% narrowing and focal 95% stenosis in distal 3rd with balloon angioplasty (Dr. Marella Chimes)  . CORONARY ANGIOPLASTY WITH STENT PLACEMENT  02/26/1996   3.0x61mm Multi-Link stent to prox/mid LAD; L main normal & short, Cfx dominant and normal, PDA & PLA normal, RCA non-dominant with small PLA and normal (Dr. Marella Chimes)  . CORONARY ARTERY BYPASS GRAFT  07/31/1996   x2; LIMA to LAD and SVG to 1st diagonal (Dr. Remus Loffler)  . CORONARY ARTERY BYPASS GRAFT  07/31/1996   x1; SVG to distal LAD (Dr. Remus Loffler)  . NM MYOCAR PERF WALL MOTION  10/2010; 06/2014   a) LexiScan Cardiolite - mild perfusion defect r/t infarct/scar with mild periifarct ischemia in mid anterior & apical anterior region; EF 67%; abnormal, low risk study;; b) Small, moderate intensity Fixed perfusion defect - mid Anterior Wall c/w known Anterior MI. LOW RISK   . TRANSTHORACIC ECHOCARDIOGRAM  02/2012; 06/2014   a) EF 50-55%, mild HK of anterospetal myocardium;  mild MR;; b) EF 55-60%, mild HK of apical anterior wall.   Mild MR   Current Meds  Medication Sig  . albuterol (PROVENTIL HFA;VENTOLIN HFA) 108 (90 BASE) MCG/ACT inhaler Inhale 1-2 puffs into the lungs every 6 (six) hours as needed for wheezing or shortness of breath.  Marland Kitchen CALCIUM PO Take 1 tablet by mouth daily.   . cetirizine (ZYRTEC) 10 MG tablet Take 1 tablet (10 mg total) by mouth daily.  . Fe Fum-FePoly-Vit C-Vit B3 (INTEGRA PO) Take 1 tablet by mouth daily. Reported on 07/16/2015  . GuaiFENesin (TUSSIN PO) Take 30 mLs by mouth every 4 (four) hours as needed. For cough  . hyoscyamine (LEVSIN, ANASPAZ) 0.125 MG tablet Take 0.125 mg by mouth 2 (two) times daily.   Marland Kitchen KLOR-CON 10 10 MEQ tablet TAKE AS INSTRUCTED BY  YOUR PRESCRIBER  . levothyroxine (SYNTHROID, LEVOTHROID) 50 MCG tablet TAKE ONE AND ONE-HALF (1 & 1/2) TABLETS DAILY  . metoprolol tartrate (LOPRESSOR) 25 MG tablet Take 1.5 tablets (37.5mg ) by mouth twice daily.  . metoprolol tartrate (LOPRESSOR) 25 MG tablet TAKE ONE AND ONE-HALF TABLETS TWICE A DAY  . Probiotic Product (ALIGN PO) Take by mouth daily.  . Probiotic Product (ALIGN PO) Take by mouth.  Marland Kitchen SYMAX-SR 0.375 MG 12 hr tablet   . XARELTO 20 MG TABS tablet TAKE 1 TABLET DAILY WITH SUPPER  . [DISCONTINUED] atorvastatin (LIPITOR) 40 MG tablet Take 1 tablet (40 mg total) by mouth at bedtime.   Allergies  Allergen Reactions  . Atorvastatin Other (See Comments)    Gas pain in abdomen with no relief  . Iohexol Hives and Rash    Red dye  . Viberzi [Eluxadoline] Nausea And Vomiting   Social History  Substance Use Topics  . Smoking status: Current Every Day Smoker    Packs/day: 0.25    Types: Cigarettes  . Smokeless tobacco: Current User     Comment: Quit at time of MI, restarted in 2009  . Alcohol use No    ROS: A comprehensive was performed. Review of Systems  Constitutional: Negative for malaise/fatigue.  HENT: Negative for nosebleeds.        - now has dentures    Respiratory: Negative for cough (Some from smoking) and wheezing.   Cardiovascular: Positive for leg swelling (pretty good - but notes it in the summer heat more than now ). Negative for claudication.       Per history of present illness  Gastrointestinal: Positive for abdominal pain, constipation and diarrhea. Negative for blood in stool.       IBS symptoms - made worse by atorvastatin with bloating and gas pain  Genitourinary: Negative for hematuria.  Musculoskeletal: Positive for joint pain (right shoulder pain).  Neurological: Positive for dizziness (Sometimes positional, sometimes if she feels her heart racing following exertion). Negative for focal weakness and headaches.  Endo/Heme/Allergies: Positive for environmental allergies. Does not bruise/bleed easily.  Psychiatric/Behavioral: Negative for depression. The patient is nervous/anxious.   All other systems reviewed and are negative.     Family History  Problem Relation Age of Onset  . Heart failure Other   . Diabetes Other   . Diabetes Mother   . Thyroid disease Paternal Aunt   . Thyroid disease Maternal Grandmother     Wt Readings from Last 3 Encounters:  03/24/16 57 kg (125 lb 9.6 oz)  10/05/15 53.4 kg (117 lb 12.8 oz)  07/16/15 52.6 kg (116 lb)    PHYSICAL EXAM BP 134/72   Pulse 60   Ht 5\' 2"  (1.575 m)   Wt 57 kg (125 lb 9.6 oz)   BMI 22.97 kg/m  General appearance: alert, cooperative, appears stated age, no distress, mildly obese and  Psych: Anxious appearing, but answers questions properly. Somewhat subdued affect Neck: no adenopathy, no carotid bruit, no JVD and supple, symmetrical, trachea midline; Small palpable nodule noted in the left thyroid gland. Lungs: clear to auscultation bilaterally, normal percussion bilaterally and Nonlabored, good air movement with mild interstitial sounds Heart: RRR with normal S1 and S2. Nondisplaced PMI. Very early peaking 1-2/6 SEM at [place]. No other M./R./G. Abdomen:  soft, non-tender; bowel sounds normal; no masses, no organomegaly Extremities: extremities normal, atraumatic, no cyanosis or edema Pulses: 2+ and symmetric Skin: Skin color, texture, turgor normal. No rashes or lesions or Well-healed sternotomy scar and  venous graft section sites. Neurologic: Alert and oriented X 3, normal strength and tone. Normal symmetric reflexes. Normal coordination and gait CN II-12 grossly intact    Adult ECG Report  Sinus rhythm, rate 60 BPM. (per computer read -  Anterior MI, age undetermined, I do not concur).  Relatively stable EKG with cadotherwise normal axis, intervals and durations.  Additional studies/ records that were reviewed today include:  Recent Labs:   Lab Results  Component Value Date   CHOL 209 (H) 10/16/2015   HDL 53 10/16/2015   LDLCALC 125 10/16/2015   TRIG 155 (H) 10/16/2015   CHOLHDL 3.9 10/16/2015  -- started (did not tolerate atorvastatin 40 mg)  ASSESSMENT / PLAN: Problem List Items Addressed This Visit    CAD- 100% LAD, s/p SVG-LAD after failed LIMA-LAD - Primary (Chronic)    No recurrence of anginal symptoms. Last cath was in 2005 show an occluded LAD with a patent LIMA. Large anterior defect noted on Myoview and echocardiogram. Likely from full-thickness scar. No aspirin or Plavix because of Xarelto. She is on moderate dose beta blocker and Has been intolerant off and on of statins. Regard to retry Crestor now. Has not had this red blood pressure to consider ARB or ACE inhibitor with absence of heart failure.      Relevant Medications   rosuvastatin (CRESTOR) 20 MG tablet   Hyperlipidemia with target LDL less than 70 (Chronic)    Unfortunately, my expectation was that she had been back on a statin but she did not. With her currently not tolerating atorvastatin, I would try a lower dose of Crestor/rosuvastatin. If she is not able to take these medications then I'm going to refer her to the lipid clinic for consideration of PCSK9  inhibitor based on recent trials. I will repeat labs in 3 months after being on Crestor. We can then determine the next course of action.      Relevant Medications   rosuvastatin (CRESTOR) 20 MG tablet   Other Relevant Orders   Lipid panel   Hepatic function panel   Paroxysmal atrial fibrillation ([place]); CHA2DS2-VASc Score 3. On Xarelto (Chronic)    Maintaining sinus rhythm. Rate control with low-dose beta blocker and no signs of bradycardia. On Xarelto for anticoagulation and no bleeding issues.      Relevant Medications   rosuvastatin (CRESTOR) 20 MG tablet   S/P CABG x 2 (Chronic)    Initial CABG was complicated by failed LIMA LAD and ended up having a SVG to LAD in addition to the SVG-diagonal. Last Myoview was in 2016.  This showed a large perfusion defect but with preserved EF. No ischemia. Would not be due for repeat stress testing until 2020.      ST elevation (STEMI) myocardial infarction involving left anterior descending coronary artery (HCC) (Chronic)    Known anterior MI with moderate perfusion defect noted on Myoview. No peri-infarct ischemia. This scar is corroborated by echocardiographic wall motion abnormalities. Thankfully, no heart failure symptoms to speak of. No recurrence of angina. She is on beta blocker but no aspirin or Plavix. She is on Xarelto for [name].      Relevant Medications   rosuvastatin (CRESTOR) 20 MG tablet   Other Relevant Orders   EKG 12-Lead (Completed)   Tobacco abuse (Chronic)    She is finally now with dysphagia actually considering smoking cessation. She was to quit. She thought about using Nicorette gum, but was somewhat off by the taste. I think that would best  for her to try the patch and see if it would will work. She can probably start the lowest dose and she is really smoking less than 5 cigarettes a day. We talked about support mechanisms with [place] quit line.  We did spend 5 minutes talking about this.         Current medicines  are reviewed at length with the patient today. (+/- concerns) none The following changes have been made: none  Patient Instructions  Medication Instructions: STOP Atorvastatin  START Rosuvastatin (Crestor) 20 mg daily.   Labwork: Your physician recommends that you return for a FASTING lipid profile and hepatic function panel in 3 months.   Follow-Up: Your physician wants you to follow-up in: 6 months with Dr. Ellyn Hack. You will receive a reminder letter in the mail two months in advance. If you don't receive a letter, please call our office to schedule the follow-up appointment.  If you need a refill on your cardiac medications before your next appointment, please call your pharmacy.     Studies Ordered:   Orders Placed This Encounter  Procedures  . Lipid panel  . Hepatic function panel  . EKG 12-Lead    follow-up appointment in 6 month with Dr Sanjuana Kava, M.D., M.S. Interventional Cardiologist   Pager # [phone]

## 2016-03-24 NOTE — Patient Instructions (Addendum)
Medication Instructions: STOP Atorvastatin  START Rosuvastatin (Crestor) 20 mg daily.   Labwork: Your physician recommends that you return for a FASTING lipid profile and hepatic function panel in 3 months.   Follow-Up: Your physician wants you to follow-up in: 6 months with Dr. Ellyn Hack. You will receive a reminder letter in the mail two months in advance. If you don't receive a letter, please call our office to schedule the follow-up appointment.  If you need a refill on your cardiac medications before your next appointment, please call your pharmacy.

## 2016-03-26 ENCOUNTER — Encounter: Payer: Self-pay | Admitting: Cardiology

## 2016-03-26 ENCOUNTER — Other Ambulatory Visit: Payer: Self-pay | Admitting: Cardiology

## 2016-03-26 NOTE — Assessment & Plan Note (Addendum)
Unfortunately, my expectation was that she had been back on a statin but she did not. With her currently not tolerating atorvastatin, I would try a lower dose of Crestor/rosuvastatin. If she is not able to take these medications then I'm going to refer her to the lipid clinic for consideration of PCSK9 inhibitor based on recent trials. I will repeat labs in 3 months after being on Crestor. We can then determine the next course of action.

## 2016-03-26 NOTE — Assessment & Plan Note (Signed)
Known anterior MI with moderate perfusion defect noted on Myoview. No peri-infarct ischemia. This scar is corroborated by echocardiographic wall motion abnormalities. Thankfully, no heart failure symptoms to speak of. No recurrence of angina. She is on beta blocker but no aspirin or Plavix. She is on Xarelto for [name].

## 2016-03-26 NOTE — Assessment & Plan Note (Signed)
No recurrence of anginal symptoms. Last cath was in 2005 show an occluded LAD with a patent LIMA. Large anterior defect noted on Myoview and echocardiogram. Likely from full-thickness scar. No aspirin or Plavix because of Xarelto. She is on moderate dose beta blocker and Has been intolerant off and on of statins. Regard to retry Crestor now. Has not had this red blood pressure to consider ARB or ACE inhibitor with absence of heart failure.

## 2016-03-26 NOTE — Assessment & Plan Note (Signed)
She is finally now with dysphagia actually considering smoking cessation. She was to quit. She thought about using Nicorette gum, but was somewhat off by the taste. I think that would best for her to try the patch and see if it would will work. She can probably start the lowest dose and she is really smoking less than 5 cigarettes a day. We talked about support mechanisms with [place] quit line.  We did spend 5 minutes talking about this.

## 2016-03-26 NOTE — Assessment & Plan Note (Signed)
Maintaining sinus rhythm. Rate control with low-dose beta blocker and no signs of bradycardia. On Xarelto for anticoagulation and no bleeding issues.

## 2016-03-26 NOTE — Assessment & Plan Note (Signed)
Initial CABG was complicated by failed LIMA LAD and ended up having a SVG to LAD in addition to the SVG-diagonal. Last Myoview was in 2016.  This showed a large perfusion defect but with preserved EF. No ischemia. Would not be due for repeat stress testing until 2020.

## 2016-03-28 ENCOUNTER — Other Ambulatory Visit: Payer: Self-pay

## 2016-03-28 MED ORDER — ROSUVASTATIN CALCIUM 20 MG PO TABS: 20.0000 mg | ORAL_TABLET | Freq: Every day | ORAL | 3 refills | Status: DC

## 2016-03-28 NOTE — Telephone Encounter (Signed)
Rx(s) sent to pharmacy electronically.  

## 2016-03-29 ENCOUNTER — Other Ambulatory Visit: Payer: Self-pay | Admitting: Cardiology

## 2016-03-29 MED ORDER — ROSUVASTATIN CALCIUM 20 MG PO TABS: 20.0000 mg | ORAL_TABLET | Freq: Every day | ORAL | 3 refills | Status: DC

## 2016-03-29 NOTE — Telephone Encounter (Signed)
Rx(s) sent to pharmacy electronically.  

## 2016-03-29 NOTE — Telephone Encounter (Signed)
New message        *STAT* If patient is at the pharmacy, call can be transferred to refill team.   1. Which medications need to be refilled? (please list name of each medication and dose if known) generic crestor 20mg  2. Which pharmacy/location (including street and city if local pharmacy) is medication to be sent to? [facility](medication was [facility])  3. Do they need a 30 day or 90 day supply?  90 day

## 2016-04-07 ENCOUNTER — Telehealth: Payer: Self-pay | Admitting: Cardiology

## 2016-04-07 NOTE — Telephone Encounter (Signed)
Will forward to dr harding for okay

## 2016-04-07 NOTE — Telephone Encounter (Signed)
NicoDerm patches are not prescription - they are OTC.. Would recommend using 7 mg patches to begin with.   [place]

## 2016-04-07 NOTE — Telephone Encounter (Signed)
Patient calling states that she is having nicotine withdrawals and would like to know if she could have a prescription for the patch. Please call to discuss,thanks.

## 2016-04-08 ENCOUNTER — Other Ambulatory Visit: Payer: Self-pay | Admitting: *Deleted

## 2016-04-08 MED ORDER — NICOTINE 7 MG/24HR TD PT24: 7.0000 mg | MEDICATED_PATCH | Freq: Every day | TRANSDERMAL | 2 refills | Status: DC

## 2016-04-08 NOTE — Telephone Encounter (Signed)
Call returned to the patient and informed that the 7mg  Nicotine Patch has been ordered and placed to [facility].

## 2016-04-08 NOTE — Telephone Encounter (Signed)
Okay, we can see if that works  Dominique Hew, MD

## 2016-04-08 NOTE — Telephone Encounter (Signed)
Tried to call the patient with no answer.

## 2016-04-08 NOTE — Telephone Encounter (Signed)
Patient stated that through [facility] that they can get it for free. Would it be possible to get a prescription?

## 2016-04-26 ENCOUNTER — Telehealth: Payer: Self-pay | Admitting: Cardiology

## 2016-04-26 NOTE — Telephone Encounter (Signed)
Phone rings w no answer, goes to disconnected/busy tone. Reattempt x1 w same result.

## 2016-04-26 NOTE — Telephone Encounter (Signed)
Dominique Barber is calling about the new medication that she on ( Rosuvastatin 20mg  ) , She is already on over the counter  calcium 600 mg and since she has been taking this medication she has almost passed out , also she is having chest pains and shortness of breath.

## 2016-04-27 NOTE — Telephone Encounter (Signed)
Spoke with pt states that while she was vacuuming on Monday she had chest pain, SOB, nausea and dizziness this is the 3 time it has happened she states that SOB and dizziness happen frequently she states that she has IBS and this happens almost daily. She is just concerned that this dizziness and CP with exertion is new. She states that this has not happened since Monday.She states that Monday is the only time the CP and dizziness happened. Pt states that this has not happened since Monday. She states that she would like a refill of the nitro, she states that she has not had any for quite awhile.   Please advise

## 2016-04-28 MED ORDER — NITROGLYCERIN 0.4 MG SL SUBL: 0.4000 mg | SUBLINGUAL_TABLET | SUBLINGUAL | 1 refills | Status: DC | PRN

## 2016-04-28 NOTE — Telephone Encounter (Signed)
Follow up appt scheduled with [facility] 05-09-16

## 2016-04-28 NOTE — Telephone Encounter (Signed)
Per Dr Ellyn Hack ,okay to refill nitroglycerin  - follow up with an extender next available

## 2016-05-08 NOTE — Progress Notes (Signed)
Cardiology Office Note    Date:  05/09/2016   ID:  Dominique Barber, DOB 1955-11-21, MRN [id number]  PCP:  No primary care provider on file.  Cardiologist: Dr. Ellyn Hack   Chief Complaint  Patient presents with  . Follow-up    states having a dizzy spell one night and having to hold on to something to get to her chair     History of Present Illness:    Dominique Barber is a 61 y.o. female with past medical history of CAD (s/p CABG in 1998 with LIMA-LAD and SVG-D1 with emergent SVG-LAD afterwards, low-risk NST in 2016), PAF (on Xarelto), HTN, HLD, and tobacco use who presents to the office today for evaluation of dizziness.   She was recently examined by Dr. Ellyn Hack in 03/2016 and reported doing well from a cardiac perspective with no recent chest pain or dyspnea on exertion. She was restarted on Crestor 20mg  at that time with plans to refer to the [facility] if LDL is not at goal within 3 months.   In talking with the patient today, she reports having 3 episodes of dizziness occurring approximately 2 weeks ago. The first episode occurred while she was vacuuming and lasted approximately 30 seconds before spontaneously resolving. The other 2 episodes occurred the following day and she reports sitting down when all at once she felt like the room was spinning. Again, these episodes only lasted approximately 30 seconds and resolved. She denies any associated chest discomfort, dyspnea, palpitations, nausea, vomiting, diaphoresis, or presyncope.  She denies any recurrent episodes since then. She is able to change positions quickly without any dizziness or lightheadedness.  She denies any recent medication changes. She is concerned she might have Type II DM as her mother's presenting symptom of this was dizziness. She does not have a PCP but is looking to establish care with one soon. She is followed by Dr. Collene Mares with GI and Dr. Dwyane Dee with Endocrinology.    Past Medical History:  Diagnosis  Date  . CAD in native artery 1998   Followup 1 month post MI revealed 95% ISR in PTCA site --> PCI with 3.0 mm x 25 mm BMS stent in proximal LAD --> 5 months later recurrent ISR of LAD BMS that compromised D1 --> referred for CABG  . COPD (chronic obstructive pulmonary disease) ([place])   . GERD (gastroesophageal reflux disease)   . Hyperlipidemia   . Hypothyroidism    On Synthroid  . Iron deficiency anemia   . Irritable bowel syndrome    Followed by Dr. Juanita Craver.  Marland Kitchen PAF (paroxysmal atrial fibrillation) ([place]) 05/03/2014   New onset - originally with RVR  . S/P CABG x 04 July 1996   Initially LIMA-LAD, SVG-D1 --> immediate LIMA failure post CABG with anterior MI --> urgent redo SVG-LAD beyond D2.; Widely patent grafts as of 2005 with left dominant system. Graft to the diagonal branch has a very small target vessel but the vein graft to LAD retrograde fills a large bifurcating D2 and antegrade fills a large D3.  . ST elevation (STEMI) myocardial infarction involving left anterior descending coronary artery ([place])  01/19/1996   Calculated by VF arrest, and anoxic brain injury with status epilepticus; POBA of LAD  . Tobacco abuse     Past Surgical History:  Procedure Laterality Date  . APPENDECTOMY    . CARDIAC CATHETERIZATION  07/30/1996   normal L main, LAD w/95% stenosis just before stent, diffuse 80-85% end-stent restenosis, 1st diagonal  with70-75% ostial stenosis, large optional diagonal that was normal, Cfx with 2 marginals and distal PLA (all normal), RCA normal (Dr. Marella Chimes)  . CARDIAC CATHETERIZATION  01/31/2003   LAD totally occluded in prox 3rd, patent Cfx, SVG to mid LAd patent, SVG to small DX1 patent, LIMA to LAD was atretic and essentially occluded in junction of prox & mid 3rd, R barciocephalic and subclavian were normal (Dr. Marella Chimes)  . CESAREAN SECTION    . CHOLECYSTECTOMY    . CORONARY ANGIOPLASTY  01/19/1996   acute anterior wall MI, anoxic encephalopahty, VF; LCfx  free of disease and dominant, RCA patent, L main short and patent, LAD with 70-80% narrowing and focal 95% stenosis in distal 3rd with balloon angioplasty (Dr. Marella Chimes)  . CORONARY ANGIOPLASTY WITH STENT PLACEMENT  02/26/1996   3.0x46mm Multi-Link stent to prox/mid LAD; L main normal & short, Cfx dominant and normal, PDA & PLA normal, RCA non-dominant with small PLA and normal (Dr. Marella Chimes)  . CORONARY ARTERY BYPASS GRAFT  07/31/1996   x2; LIMA to LAD and SVG to 1st diagonal (Dr. Remus Loffler)  . CORONARY ARTERY BYPASS GRAFT  07/31/1996   x1; SVG to distal LAD (Dr. Remus Loffler)  . NM MYOCAR PERF WALL MOTION  10/2010; 06/2014   a) LexiScan Cardiolite - mild perfusion defect r/t infarct/scar with mild periifarct ischemia in mid anterior & apical anterior region; EF 67%; abnormal, low risk study;; b) Small, moderate intensity Fixed perfusion defect - mid Anterior Wall c/w known Anterior MI. LOW RISK   . TRANSTHORACIC ECHOCARDIOGRAM  02/2012; 06/2014   a) EF 50-55%, mild HK of anterospetal myocardium; mild MR;; b) EF 55-60%, mild HK of apical anterior wall.   Mild MR    Current Medications: Outpatient Medications Prior to Visit  Medication Sig Dispense Refill  . albuterol (PROVENTIL HFA;VENTOLIN HFA) 108 (90 BASE) MCG/ACT inhaler Inhale 1-2 puffs into the lungs every 6 (six) hours as needed for wheezing or shortness of breath. 1 Inhaler 0  . CALCIUM PO Take 1 tablet by mouth daily.     . cetirizine (ZYRTEC) 10 MG tablet Take 1 tablet (10 mg total) by mouth daily. 90 tablet 1  . Fe Fum-FePoly-Vit C-Vit B3 (INTEGRA PO) Take 1 tablet by mouth daily. Reported on 07/16/2015    . GuaiFENesin (TUSSIN PO) Take 30 mLs by mouth every 4 (four) hours as needed. For cough    . hyoscyamine (LEVSIN, ANASPAZ) 0.125 MG tablet Take 0.125 mg by mouth 2 (two) times daily.     Marland Kitchen KLOR-CON 10 10 MEQ tablet TAKE AS INSTRUCTED BY YOUR PRESCRIBER 180 tablet 36  . levothyroxine (SYNTHROID, LEVOTHROID) 50 MCG tablet TAKE ONE AND  ONE-HALF (1 & 1/2) TABLETS DAILY 135 tablet 1  . metoprolol tartrate (LOPRESSOR) 25 MG tablet Take 1.5 tablets (37.5 mg total) by mouth 2 (two) times daily. 270 tablet 3  . nicotine (NICODERM CQ - DOSED IN MG/24 HR) 7 mg/24hr patch Place 1 patch (7 mg total) onto the skin daily. 28 patch 2  . nitroGLYCERIN (NITROSTAT) 0.4 MG SL tablet Place 1 tablet (0.4 mg total) under the tongue every 5 (five) minutes as needed for chest pain. 25 tablet 1  . Probiotic Product (ALIGN PO) Take by mouth daily.    . rosuvastatin (CRESTOR) 20 MG tablet Take 1 tablet (20 mg total) by mouth daily. 90 tablet 3  . SYMAX-SR 0.375 MG 12 hr tablet     . XARELTO 20 MG TABS tablet  TAKE 1 TABLET DAILY WITH SUPPER 90 tablet 1  . Probiotic Product (ALIGN PO) Take by mouth.     No facility-administered medications prior to visit.      Allergies:   Atorvastatin; Iohexol; and Viberzi [eluxadoline]   Social History   Social History  . Marital status: Married    Spouse name: N/A  . Number of children: 3  . Years of education: N/A   Occupational History  .  Disabled   Social History Main Topics  . Smoking status: Current Every Day Smoker    Packs/day: 0.25    Types: Cigarettes  . Smokeless tobacco: Current User     Comment: Quit at time of MI, restarted in 2009  . Alcohol use No  . Drug use: Unknown  . Sexual activity: Not Asked   Other Topics Concern  . None   Social History Narrative   Married, mother of 3 children (2 living sons age 44-26 and 30-31; her daughter was the victim of a murder that took place sometime around the time of the patient's MI. She was under significant amount of stress as her daughter had gone missing. She was never found. Finally the suspect was charged and convicted in 2009. She had sessile he stop smoking until that time frame when it brought back memories and she is now back to smoking a half pack a day.   She does not get routine exercise, but is active around the house and doing  housecleaning chores.   She does not drink alcohol.     Family History:  The patient's family history includes Diabetes in her mother and other; Heart failure in her other; Thyroid disease in her maternal grandmother and paternal aunt.   Review of Systems:   Please see the history of present illness.     General:  No chills, fever, night sweats or weight changes.  Cardiovascular:  No chest pain, dyspnea on exertion, edema, orthopnea, palpitations, paroxysmal nocturnal dyspnea. Dermatological: No rash, lesions/masses Respiratory: No cough, dyspnea Urologic: No hematuria, dysuria Abdominal:   No nausea, vomiting, diarrhea, bright red blood per rectum, melena, or hematemesis Neurologic:  No visual changes, wkns, changes in mental status. Positive for dizziness.  All other systems reviewed and are otherwise negative except as noted above.   Physical Exam:    VS:  BP 130/84   Pulse 62   Ht 5\' 1"  (1.549 m)   Wt 127 lb 9.6 oz (57.9 kg)   BMI 24.11 kg/m    General: Well developed, well nourished Caucasian female appearing in no acute distress. Head: Normocephalic, atraumatic, sclera non-icteric, no xanthomas, nares are without discharge.  Neck: No carotid bruits. JVD not elevated.  Lungs: Respirations regular and unlabored, without wheezes or rales.  Heart: Regular rate and rhythm. No S3 or S4.  No murmur, no rubs, or gallops appreciated. Abdomen: Soft, non-tender, non-distended with normoactive bowel sounds. No hepatomegaly. No rebound/guarding. No obvious abdominal masses. Msk:  Strength and tone appear normal for age. No joint deformities or effusions. Extremities: No clubbing or cyanosis. No lower extremity edema.  Distal pedal pulses are 2+ bilaterally. Neuro: Alert and oriented X 3. Moves all extremities spontaneously. No focal deficits noted. Psych:  Responds to questions appropriately with a normal affect. Skin: No rashes or lesions noted  Wt Readings from Last 3 Encounters:    05/09/16 127 lb 9.6 oz (57.9 kg)  03/24/16 125 lb 9.6 oz (57 kg)  10/05/15 117 lb 12.8 oz (53.4 kg)  Studies/Labs Reviewed:   EKG:  EKG is ordered today. The ekg ordered today demonstrates NSR, HR 62, with atrial enlargement. No acute ST or T-wave changes when compared to prior tracings.   Recent Labs: 07/13/2015: TSH 1.20 10/16/2015: ALT 14; BUN 15; Creat 0.83; Potassium 4.8; Sodium 142   Lipid Panel    Component Value Date/Time   CHOL 209 (H) 10/16/2015 1221   TRIG 155 (H) 10/16/2015 1221   HDL 53 10/16/2015 1221   CHOLHDL 3.9 10/16/2015 1221   VLDL 31 (H) 10/16/2015 1221   LDLCALC 125 10/16/2015 1221    Additional studies/ records that were reviewed today include:   NST: 06/2014  The left ventricular ejection fraction is hyperdynamic (>65%).  Defect 1: There is a small defect of moderate severity present in the mid anterior location.  There was no ST segment deviation noted during stress.   Small mid anterior wall scar without ischemia. Low risk study  Echocardiogram: 06/2014 Study Conclusions  - Left ventricle: The cavity size was normal. Systolic function was   normal. The estimated ejection fraction was in the range of 55%   to 60%. There is mild hypokinesis of the apicalanterior   myocardium. - Aortic valve: Trileaflet; mildly thickened, mildly calcified   leaflets. - Mitral valve: There was mild regurgitation.   Assessment:    1. Dizziness   2. Atherosclerosis of native coronary artery of native heart without angina pectoris   3. Paroxysmal atrial fibrillation ([place]); CHA2DS2-VASc Score 3. On Xarelto   4. Screening for diabetes mellitus   5. Hypothyroidism, unspecified type   6. Tobacco use      Plan:   In order of problems listed above:  1. Dizziness - she reports having 3 episodes of dizziness occurring approximately 2 weeks ago. Each episode would last for approximately 30 seconds then spontaneously resolve. Previous notes mention that  she had chest discomfort with this, but she denies any chest discomfort, palpitations, diaphoresis, dyspnea, or presyncope. - recent cardiac workup includes an echo in 06/2014 which showed a preserved EF with known apical anterior HK secondary to her prior MI and mild MR. NST showed a small scar with no ischemia and was overall low-risk.  - Overall, her symptoms sound atypical for a cardiac etiology. Perhaps they were secondary to BPPV. Would not pursue further evaluation unless she has recurrent symptoms as today's EKG is without acute abnormalities. Would recommend a 30-day cardiac event monitor if this becomes recurrent. Will check TSH and Hgb A1c as below.  2. CAD - s/p CABG in 1998 with LIMA-LAD and SVG-D1 with emergent SVG-LAD afterwards, low-risk NST in 2016.  - she denies any recent anginal symptoms. EKG today is without acute ischemic changes. - continue BB and statin therapy. No ASA secondary to need for Xarelto.   3. Paroxysmal Atrial Fibrillation - she denies any recent palpitations or dyspnea on exertion. - continue Lopressor 37.5mg  BID for rate-control. - This patients CHA2DS2-VASc Score and unadjusted Ischemic Stroke Rate (% per year) is equal to 3.2 % stroke rate/year from a score of 3 (HTN, Vascular, Female). She denies any evidence of active bleeding. Continue Xarelto 20mg  daily for anticoagulation.   4. Screening for Type 2 DM - in the setting of dizziness, known CAD, and family history of Type 2 DM will check Hgb A1c.   5. Hypothyroidism - recheck TSH and Free T4 - will forward results to her Endocrinologist, Dr. Dwyane Dee.   6. Tobacco Use - cessation advised.  Medication Adjustments/Labs and Tests Ordered: Current medicines are reviewed at length with the patient today.  Concerns regarding medicines are outlined above.  Medication changes, Labs and Tests ordered today are listed in the Patient Instructions below. Patient Instructions  Medication Instructions: No  changes   Labwork: Please have the following labs drawn today: TSH, Free T4, A1C  Follow-Up: Your physician wants you to follow-up in: 6 months with Dr. Ellyn Hack. You will receive a reminder letter in the mail two months in advance. If you don't receive a letter, please call our office to schedule the follow-up appointment.  If you need a refill on your cardiac medications before your next appointment, please call your pharmacy.    Signed, Erma Heritage, PA-C  05/09/2016 [facility] [place], [place], [place]  [address] Phone: [phone]; Fax: [phone]  [address] [place], [place] [address] Phone: [phone]

## 2016-05-09 ENCOUNTER — Encounter: Payer: Self-pay | Admitting: Student

## 2016-05-09 ENCOUNTER — Ambulatory Visit (INDEPENDENT_AMBULATORY_CARE_PROVIDER_SITE_OTHER): Payer: Medicare Other | Admitting: Student

## 2016-05-09 VITALS — BP 130/84 | HR 62 | Ht 61.0 in | Wt 127.6 lb

## 2016-05-09 DIAGNOSIS — R42 Dizziness and giddiness: Secondary | ICD-10-CM | POA: Diagnosis not present

## 2016-05-09 DIAGNOSIS — I2510 Atherosclerotic heart disease of native coronary artery without angina pectoris: Secondary | ICD-10-CM

## 2016-05-09 DIAGNOSIS — Z720 Tobacco use: Secondary | ICD-10-CM | POA: Diagnosis not present

## 2016-05-09 DIAGNOSIS — I480 Paroxysmal atrial fibrillation: Secondary | ICD-10-CM | POA: Diagnosis not present

## 2016-05-09 DIAGNOSIS — E039 Hypothyroidism, unspecified: Secondary | ICD-10-CM | POA: Diagnosis not present

## 2016-05-09 DIAGNOSIS — Z131 Encounter for screening for diabetes mellitus: Secondary | ICD-10-CM

## 2016-05-09 LAB — T4, FREE: Free T4: 1.5 ng/dL (ref 0.8–1.8)

## 2016-05-09 LAB — TSH: TSH: 1.37 mIU/L

## 2016-05-09 NOTE — Patient Instructions (Signed)
Medication Instructions: No changes   Labwork: Please have the following labs drawn today: TSH, Free T4, A1C   Follow-Up: Your physician wants you to follow-up in: 6 months with Dr. Ellyn Hack. You will receive a reminder letter in the mail two months in advance. If you don't receive a letter, please call our office to schedule the follow-up appointment.    If you need a refill on your cardiac medications before your next appointment, please call your pharmacy.

## 2016-05-10 LAB — HEMOGLOBIN A1C
Hgb A1c MFr Bld: 5.6 % (ref <5.7)
Mean Plasma Glucose: 114 mg/dL

## 2016-06-28 DIAGNOSIS — Z86010 Personal history of colonic polyps: Secondary | ICD-10-CM | POA: Diagnosis not present

## 2016-06-28 DIAGNOSIS — K580 Irritable bowel syndrome with diarrhea: Secondary | ICD-10-CM | POA: Diagnosis not present

## 2016-06-28 DIAGNOSIS — K219 Gastro-esophageal reflux disease without esophagitis: Secondary | ICD-10-CM | POA: Diagnosis not present

## 2016-06-28 DIAGNOSIS — Z1211 Encounter for screening for malignant neoplasm of colon: Secondary | ICD-10-CM | POA: Diagnosis not present

## 2016-07-12 ENCOUNTER — Other Ambulatory Visit: Payer: Medicare Other

## 2016-07-14 ENCOUNTER — Other Ambulatory Visit: Payer: Self-pay | Admitting: Cardiology

## 2016-07-15 ENCOUNTER — Ambulatory Visit: Payer: Medicare Other | Admitting: Endocrinology

## 2016-07-19 ENCOUNTER — Telehealth: Payer: Self-pay | Admitting: Cardiology

## 2016-07-19 NOTE — Telephone Encounter (Signed)
Clearance routed via EPIC 

## 2016-07-19 NOTE — Telephone Encounter (Signed)
[facility] requests clearance for: 1. Type of surgery: colonoscopy 2. Date of surgery: Sept. 5, 2018 3. Surgeon: Dr. Juanita Craver 4. Medications that need to be held & how long: Xarelto 5. Fax and/or Phone: (p) [phone]  (f) [phone]

## 2016-07-19 NOTE — Telephone Encounter (Signed)
Okay to hold Xarelto 2 days preprocedure. Restart the next day  Glenetta Hew, MD

## 2016-08-10 ENCOUNTER — Other Ambulatory Visit: Payer: Self-pay | Admitting: Endocrinology

## 2016-08-10 ENCOUNTER — Other Ambulatory Visit (INDEPENDENT_AMBULATORY_CARE_PROVIDER_SITE_OTHER): Payer: Medicare Other

## 2016-08-10 ENCOUNTER — Ambulatory Visit (INDEPENDENT_AMBULATORY_CARE_PROVIDER_SITE_OTHER): Payer: Medicare Other | Admitting: Cardiology

## 2016-08-10 VITALS — BP 113/74 | HR 73 | Ht 62.0 in | Wt 134.8 lb

## 2016-08-10 DIAGNOSIS — I480 Paroxysmal atrial fibrillation: Secondary | ICD-10-CM | POA: Diagnosis not present

## 2016-08-10 DIAGNOSIS — E890 Postprocedural hypothyroidism: Secondary | ICD-10-CM

## 2016-08-10 DIAGNOSIS — I2510 Atherosclerotic heart disease of native coronary artery without angina pectoris: Secondary | ICD-10-CM | POA: Diagnosis not present

## 2016-08-10 DIAGNOSIS — Z720 Tobacco use: Secondary | ICD-10-CM | POA: Diagnosis not present

## 2016-08-10 DIAGNOSIS — Z79899 Other long term (current) drug therapy: Secondary | ICD-10-CM

## 2016-08-10 DIAGNOSIS — Z951 Presence of aortocoronary bypass graft: Secondary | ICD-10-CM | POA: Diagnosis not present

## 2016-08-10 DIAGNOSIS — E785 Hyperlipidemia, unspecified: Secondary | ICD-10-CM | POA: Diagnosis not present

## 2016-08-11 LAB — T4, FREE: Free T4: 1.13 ng/dL (ref 0.60–1.60)

## 2016-08-11 LAB — TSH: TSH: 2.83 u[IU]/mL (ref 0.35–4.50)

## 2016-08-11 NOTE — Patient Instructions (Addendum)
NO CHANGES WITH CURRENT TREATMENT OR MEDICATIONS     PLEASE HAVE LABS - DONE AT [facility]----LIPIDS , LIVER PANEL DO NOT EAT OR DRINK THE MORNING OF THE TEST. YOU MAY COME BACK TO THE OFFICE TO HAVE LABS DONE- NO APPOINTMENT NEEDED - HOURS OF OPERATION IS 8 AM TO 4:30 PM     Your physician wants you to follow-up in [place] [address] [place]. You will receive a reminder letter in the mail two months in advance. If you don't receive a letter, please call our office to schedule the follow-up appointment.

## 2016-08-12 ENCOUNTER — Encounter: Payer: Self-pay | Admitting: Cardiology

## 2016-08-12 NOTE — Assessment & Plan Note (Signed)
Most recent Myoview in 2016 which showed a large perfusion defect of preserved EF. No ischemia. She is not symptomatic. I would check follow-up Myoview in 2020 and she has recurrent symptoms.

## 2016-08-12 NOTE — Assessment & Plan Note (Signed)
Intolerant of atorvastatin, but doing better on Crestor. She is due for labs to be checked. We will check lipids and liver function

## 2016-08-12 NOTE — Assessment & Plan Note (Addendum)
No recurrent anginal symptoms. She has not had heart catheterization since 2005 She remains on beta blocker and statin. Not on aspirin or Plavix because of Xarelto. She seems to be doing better with Crestor.

## 2016-08-12 NOTE — Progress Notes (Signed)
PCP: System, Pcp Not In  Clinic Note: Chief Complaint  Patient presents with  . Follow-up    Questions about holding medication for a procedure  . Coronary Artery Disease  . Atrial Fibrillation    On Xarelto    HPI: Dominique Barber is a 61 y.o. female with a PMH below who presents today for 6 month f/u of CAD-CABG, PAF.  He last came in to discuss results of Myoview & Echo ordered @ last visit to evaluate for possible ischemic etiology for [name].She had been a heavy smoker, she quit initially and then return to smoking, no distress Past Cardiac History:  Anterior STEMI 9 (age 89): She had EF arrest with cardiac shock and mild anoxic brain injury (with status epilepticus). --> Emergent PTCA to the LAD  She then had BMS stent placed in February 1998  Shortly thereafter she underwent CABG with the LIMA-LAD and SVG-D2. - For in-stent restenosis  Postop complication -- acute occlusion of the LIMA graft -> recurrent anterior MI -> referred for redo CABG with SVG-mLAD.   Most recent Cath Jan 2005: Patent SVG-D1 & SVG-mLAD- (no significant change in disease). Large bifurcating D1 branch that is filled via retrograde flow from the LAD. Dominant circumflex with small OM1 large bifurcating OM 2 as well as a large OM 3 before terminating in a bifurcation into LPL and large PDA. The LIMA LAD was atretic/occluded.  -> EF roughly 50% with inferoapical hypokinesis.  Myoview June 2016 - Small mid anterior wall scar without ischemia. Low risk study  By her report, since her CABG she has been troubled with IBS  Afib - April 2016 - now on Xarelto.   Dominique Barber was last seen on 05/09/2016 by Bernerd Pho, PA for dizziness. (She essentially had a dizzy spell when which she almost fell down having to hold on to something). 2. 3 episodes in 2 weeks the first one occurred while vacuuming lasted approximately 30 seconds. 2 other episodes occurred the next day when she was sitting down  and she also one spell the world spinning around her. Each episode only lasted 30 seconds with no associated palpitations or nausea vomiting or presyncope. She was concerned that she could have been hypoglycemic. Symptoms were thought to be related to potentially vertigo. They decided not to order an event monitor. They did check a hemoglobin A1c and for the results to Dr. Dwyane Dee, her endocrinologist. - A1c was 5.6  Recent Hospitalizations:None  Studies Reviewed:  No new studies besides labs  - Based on her most recent lipids: we tried to start 40 mg atorvastatin (she previously took Lipitor without issues). Lab Results  Component Value Date   CHOL 209 (H) 10/16/2015   HDL 53 10/16/2015   LDLCALC 125 10/16/2015   TRIG 155 (H) 10/16/2015   CHOLHDL 3.9 10/16/2015    Interval History: Overall, she is doing pretty well from a cardiac standpoint. No routine chest tightness or pressure with rest or exertion. No real exertional dyspnea unless she overdoes it. Spends lots of time cleaning house / cares for dogs, etc.   Few gas related epigastric or lateral CP.  Feels some infrequent heart "skipping", fluttering or fast beats - usually @ night & only last a few seconds - usually occurring @ night when she lies down -- nothing to suggest recurrence of Afib (or at least Afib -RVR).   She tried to take atorvastatin, but noted that she had horrible gas/bloating pain (worse IBS) that  resolved within a day of stopping the medicine -- she simply decided to stop it without telling us.  No PND, orthopnea or edema.  No lightheadedness, dizziness, weakness or syncope/near syncope. No TIA/amaurosis fugax symptoms.  No claudication. Most notably she is having some GI symptoms of nausea and vomiting intermittently -- this was made worse with atorvastatin.  She would very much like to stop smoking, but is not really sure how to do it. She is down to maybe a handful cigarettes a day.  Past Medical History:    Diagnosis Date  . CAD in native artery 1998   Followup 1 month post MI revealed 95% ISR in PTCA site --> PCI with 3.0 mm x 25 mm BMS stent in proximal LAD --> 5 months later recurrent ISR of LAD BMS that compromised D1 --> referred for CABG .--> Emergent redo single vessel CABG with SVG-mLAD for occluded LIMA  . COPD (chronic obstructive pulmonary disease) ([place])   . GERD (gastroesophageal reflux disease)   . Hyperlipidemia   . Hypothyroidism    On Synthroid  . Iron deficiency anemia   . Irritable bowel syndrome    Followed by Dr. Juanita Craver.  Marland Kitchen PAF (paroxysmal atrial fibrillation) ([place]) 05/03/2014   New onset - originally with RVR  . S/P CABG x 2 07/1996   Initially LIMA-LAD, SVG-D1 --> immediate LIMA failure post CABG with anterior MI --> urgent redo SVG-LAD beyond D2.; Widely patent grafts as of 2005 with left dominant system. Graft to the diagonal branch has a very small target vessel but the vein graft to LAD retrograde fills a large bifurcating D2 and antegrade fills a large D3.  . ST elevation (STEMI) myocardial infarction involving left anterior descending coronary artery ([place]) 62/69/4854   Complicated by VF arrest, and anoxic brain injury with status epilepticus; POBA of LAD --> 6 months later, after 2 vessel CABG she had postop complication with occluded LIMA/second anterior STEMI  . Tobacco abuse     Past Surgical History:  Procedure Laterality Date  . APPENDECTOMY    . CARDIAC CATHETERIZATION  07/30/1996   normal L main, LAD w/95% stenosis just before stent, diffuse 80-85% end-stent restenosis, 1st diagonal with70-75% ostial stenosis, large optional diagonal that was normal, Cfx with 2 marginals and distal PLA (all normal), RCA normal (Dr. Marella Chimes)  . CARDIAC CATHETERIZATION  01/31/2003   LAD totally occluded in prox 3rd, patent Cfx, SVG to mid LAd patent, SVG to small DX1 patent, LIMA to LAD was atretic and essentially occluded in junction of prox & mid 3rd, R  barciocephalic and subclavian were normal (Dr. Marella Chimes)  . CESAREAN SECTION    . CHOLECYSTECTOMY    . CORONARY ANGIOPLASTY  01/19/1996   acute anterior wall MI, anoxic encephalopahty, VF; LCfx free of disease and dominant, RCA patent, L main short and patent, LAD with 70-80% narrowing and focal 95% stenosis in distal 3rd with balloon angioplasty (Dr. Marella Chimes)  . CORONARY ANGIOPLASTY WITH STENT PLACEMENT  02/26/1996   3.0x85mm Multi-Link stent to prox/mid LAD; L main normal & short, Cfx dominant and normal, PDA & PLA normal, RCA non-dominant with small PLA and normal (Dr. Marella Chimes)  . CORONARY ARTERY BYPASS GRAFT  07/31/1996   x2; LIMA to LAD and SVG to 1st diagonal (Dr. Remus Loffler)  . CORONARY ARTERY BYPASS GRAFT  07/31/1996   x1; SVG to distal LAD (Dr. Remus Loffler)  . NM MYOCAR PERF WALL MOTION  10/2010; 06/2014   a) LexiScan  Cardiolite - mild perfusion defect r/t infarct/scar with mild periifarct ischemia in mid anterior & apical anterior region; EF 67%; abnormal, low risk study;; b) Small, moderate intensity Fixed perfusion defect - mid Anterior Wall c/w known Anterior MI. LOW RISK   . TRANSTHORACIC ECHOCARDIOGRAM  02/2012; 06/2014   a) EF 50-55%, mild HK of anterospetal myocardium; mild MR;; b) EF 55-60%, mild HK of apical anterior wall.   Mild MR   No outpatient prescriptions have been marked as taking for the 08/10/16 encounter (Office Visit) with Leonie Man, MD.   Prior to Admission medications   Medication Sig Start Date End Date Taking? Authorizing Provider  albuterol (PROVENTIL HFA;VENTOLIN HFA) 108 (90 BASE) MCG/ACT inhaler Inhale 1-2 puffs into the lungs every 6 (six) hours as needed for wheezing or shortness of breath. 05/05/14   Hongalgi, Lenis Dickinson, MD  CALCIUM PO Take 1 tablet by mouth daily.     [provider]  cetirizine (ZYRTEC) 10 MG tablet Take 1 tablet (10 mg total) by mouth daily. 03/01/16   Elayne Snare, MD  Fe Fum-FePoly-Vit C-Vit B3 (INTEGRA PO) Take 1 tablet  by mouth daily. Reported on 07/16/2015    [provider]  GuaiFENesin (TUSSIN PO) Take 30 mLs by mouth every 4 (four) hours as needed. For cough    [provider]  hyoscyamine (LEVSIN, ANASPAZ) 0.125 MG tablet Take 0.125 mg by mouth 2 (two) times daily.     [provider]  KLOR-CON 10 10 MEQ tablet TAKE AS INSTRUCTED BY YOUR PRESCRIBER 12/10/15   Leonie Man, MD  levothyroxine (SYNTHROID, LEVOTHROID) 50 MCG tablet TAKE ONE AND ONE-HALF (1 & 1/2) TABLETS DAILY 03/07/16   Elayne Snare, MD  metoprolol tartrate (LOPRESSOR) 25 MG tablet Take 1.5 tablets (37.5 mg total) by mouth 2 (two) times daily. 03/28/16   Leonie Man, MD  nicotine (NICODERM CQ - DOSED IN MG/24 HR) 7 mg/24hr patch Place 1 patch (7 mg total) onto the skin daily. 04/08/16   Leonie Man, MD  nitroGLYCERIN (NITROSTAT) 0.4 MG SL tablet Place 1 tablet (0.4 mg total) under the tongue every 5 (five) minutes as needed for chest pain. 04/28/16 05/23/16  Leonie Man, MD  Probiotic Product (ALIGN PO) Take by mouth daily.    [provider]  rosuvastatin (CRESTOR) 20 MG tablet Take 1 tablet (20 mg total) by mouth daily. 03/29/16 06/27/16  Leonie Man, MD  SYMAX-SR 0.375 MG 12 hr tablet  12/09/14   [provider]  XARELTO 20 MG TABS tablet TAKE 1 TABLET DAILY WITH SUPPER 07/14/16   Leonie Man, MD    Allergies  Allergen Reactions  . Atorvastatin Other (See Comments)    Gas pain in abdomen with no relief  . Iohexol Hives and Rash    Red dye  . Viberzi [Eluxadoline] Nausea And Vomiting   Social History  Substance Use Topics  . Smoking status: Current Every Day Smoker    Packs/day: 0.25    Types: Cigarettes  . Smokeless tobacco: Current User     Comment: Quit at time of MI, restarted in 2009  . Alcohol use No   Family History  Problem Relation Age of Onset  . Heart failure Other   . Diabetes Other   . Diabetes Mother   . Thyroid disease Paternal Aunt   . Thyroid  disease Maternal Grandmother     ROS: A comprehensive was performed. Review of Systems  Constitutional: Negative for malaise/fatigue.  HENT: Negative for nosebleeds.        - now has dentures  Respiratory: Negative for cough (Some from smoking) and wheezing.   Cardiovascular: Negative for claudication and leg swelling (pretty good - but notes it in the summer heat more than now ).       Per history of present illness  Gastrointestinal: Positive for abdominal pain (Occasional with IBS), constipation and diarrhea (Occasional with IBS). Negative for blood in stool.       IBS symptoms; Symptoms were worse with atorvastatin associated bloating and gas  Genitourinary: Negative for hematuria.  Musculoskeletal: Positive for joint pain (right shoulder pain).  Neurological: Positive for dizziness (No further dizzy episodes ). Negative for focal weakness and headaches.  Endo/Heme/Allergies: Positive for environmental allergies. Does not bruise/bleed easily.  Psychiatric/Behavioral: Negative for depression. The patient is nervous/anxious.   All other systems reviewed and are negative.   Wt Readings from Last 3 Encounters:  08/10/16 134 lb 12.8 oz (61.1 kg)  05/09/16 127 lb 9.6 oz (57.9 kg)  03/24/16 125 lb 9.6 oz (57 kg)    PHYSICAL EXAM BP 113/74   Pulse 73   Ht 5\' 2"  (1.575 m)   Wt 134 lb 12.8 oz (61.1 kg)   BMI 24.66 kg/m   Physical Exam  Constitutional: She is oriented to person, place, and time. She appears well-developed and well-nourished. No distress.  HENT:  Head: Normocephalic and atraumatic.  Mouth/Throat: No oropharyngeal exudate.  Eyes: Pupils are equal, round, and reactive to light. EOM are normal.  Neck: Normal range of motion. Neck supple. No hepatojugular reflux and no JVD present. Carotid bruit is not present.  Cardiovascular: Normal rate, regular rhythm and intact distal pulses.  Exam reveals no gallop and no friction rub.   Murmur heard.  Harsh  crescendo-decrescendo early systolic murmur is present with a grade of 1/6  at the upper right sternal border Pulmonary/Chest: Effort normal and breath sounds normal. No respiratory distress. She has no wheezes. She has no rales.  Abdominal: Soft. Bowel sounds are normal. She exhibits no distension. There is no tenderness.  Musculoskeletal: Normal range of motion. She exhibits no edema.  Neurological: She is alert and oriented to person, place, and time.  Skin: Skin is warm and dry. No rash noted. No erythema.  Psychiatric: She has a normal mood and affect. Her behavior is normal. Judgment and thought content normal.  Nursing note and vitals reviewed.   Adult ECG Report  Not checked  Additional studies/ records that were reviewed today include:  Recent Labs:  Has not had recent labs checked. Lab Results  Component Value Date   CHOL 209 (H) 10/16/2015   HDL 53 10/16/2015   LDLCALC 125 10/16/2015   TRIG 155 (H) 10/16/2015   CHOLHDL 3.9 10/16/2015    ASSESSMENT / PLAN: Problem List Items Addressed This Visit    CAD- 100% LAD, s/p SVG-LAD after failed LIMA-LAD (Chronic)    No recurrent anginal symptoms. She has not had heart catheterization since 2005 She remains on beta blocker and statin. Not on aspirin or Plavix because of Xarelto. She seems to be doing better with Crestor.      Relevant Orders   Lipid panel   Hyperlipidemia with target LDL less than 70 (Chronic)    Intolerant of atorvastatin, but doing better on Crestor. She is due for labs to be checked. We will check lipids and liver function      Relevant Orders   Lipid panel  Hepatic function panel   Paroxysmal atrial fibrillation ([place]); CHA2DS2-VASc Score 3. On Xarelto (Chronic)    She is concerned that she will be holding her Xarelto for a planned procedure. This is something that I had approved without having seen her. She is worried about being at risk for stroke. I explained to her that the concern about stroke is  balanced all over entire year and that she still needs to have her colonoscopy done.   the benefit of being on a DOAC is that she is really not covered for minimal amount of days. I explained to her the risks and that this is a risk to be usually have to take. She has not had any bleeding issues on Xarelto and would like to stay on it. She is rate controlled with metoprolol.      S/P CABG x 2 - Primary (Chronic)    Most recent Myoview in 2016 which showed a large perfusion defect of preserved EF. No ischemia. She is not symptomatic. I would check follow-up Myoview in 2020 and she has recurrent symptoms.      Tobacco abuse (Chronic)    We did not spend time talking about smoking cessation. She still smoking, try to cut down. Smoking cessation instruction/counseling given:  counseled patient on the dangers of tobacco use, advised patient to stop smoking, and reviewed strategies to maximize success       Other Visit Diagnoses    Medication management       Relevant Orders   Lipid panel   Hepatic function panel      Current medicines are reviewed at length with the patient today. (+/- concerns) none The following changes have been made: none  Patient Instructions  NO CHANGES WITH CURRENT TREATMENT OR MEDICATIONS     PLEASE HAVE LABS - DONE AT [facility]----LIPIDS , LIVER PANEL DO NOT EAT OR DRINK THE MORNING OF THE TEST. YOU MAY COME BACK TO THE OFFICE TO HAVE LABS DONE- NO APPOINTMENT NEEDED - HOURS OF OPERATION IS 8 AM TO 4:30 PM     Your physician wants you to follow-up in [place] DR Latham Kinzler. You will receive a reminder letter in the mail two months in advance. If you don't receive a letter, please call our office to schedule the follow-up appointment.    Studies Ordered:   Orders Placed This Encounter  Procedures  . Lipid panel  . Hepatic function panel    follow-up appointment in 6 month with Dr Sanjuana Kava, M.D., M.S. Interventional  Cardiologist   Pager # [phone]

## 2016-08-12 NOTE — Assessment & Plan Note (Signed)
She is concerned that she will be holding her Xarelto for a planned procedure. This is something that I had approved without having seen her. She is worried about being at risk for stroke. I explained to her that the concern about stroke is balanced all over entire year and that she still needs to have her colonoscopy done.   the benefit of being on a DOAC is that she is really not covered for minimal amount of days. I explained to her the risks and that this is a risk to be usually have to take. She has not had any bleeding issues on Xarelto and would like to stay on it. She is rate controlled with metoprolol.

## 2016-08-12 NOTE — Assessment & Plan Note (Signed)
We did not spend time talking about smoking cessation. She still smoking, try to cut down. Smoking cessation instruction/counseling given:  counseled patient on the dangers of tobacco use, advised patient to stop smoking, and reviewed strategies to maximize success

## 2016-08-16 ENCOUNTER — Ambulatory Visit (INDEPENDENT_AMBULATORY_CARE_PROVIDER_SITE_OTHER): Payer: Medicare Other | Admitting: Endocrinology

## 2016-08-16 ENCOUNTER — Encounter: Payer: Self-pay | Admitting: Endocrinology

## 2016-08-16 VITALS — BP 114/64 | HR 64 | Ht 62.0 in | Wt 135.2 lb

## 2016-08-16 DIAGNOSIS — E042 Nontoxic multinodular goiter: Secondary | ICD-10-CM

## 2016-08-16 DIAGNOSIS — E890 Postprocedural hypothyroidism: Secondary | ICD-10-CM | POA: Diagnosis not present

## 2016-08-16 DIAGNOSIS — I2510 Atherosclerotic heart disease of native coronary artery without angina pectoris: Secondary | ICD-10-CM | POA: Diagnosis not present

## 2016-08-16 NOTE — Progress Notes (Signed)
Patient ID: Dominique Barber, female   DOB: 09-10-1955, 61 y.o.   MRN: [id number]   Reason for Appointment:  Hypothyroidism, followup visit    History of Present Illness:   The hypothyroidism was first diagnosed in 1999 after radioactive iodine treatment for toxic nodular goiter Subsequently she became hypothyroid and has been on thyroid supplementation She has generally been on stable dose of Synthroid  On her follow-up visit in 11/16 her TSH was lower than usual at 0.13 Her dose was reduced from 100 down to 75 g     Subsequently TSH has been normal without any change needed in her doses However her weight has gone up significantly since last year as she is quitting smoking  Patient has no complaints of unusual fatigue            The patient is taking the thyroid supplement consistently in the morning before breakfast.  Not taking any calcium or iron -containing supplements  In the morning  TSH levels again consistently normal   Lab Results  Component Value Date   FREET4 1.13 08/10/2016   FREET4 1.5 05/09/2016   FREET4 1.07 07/13/2015   TSH 2.83 08/10/2016   TSH 1.37 05/09/2016   TSH 1.20 07/13/2015    GOITER:   She apparently has had a multinodular goiter since her 4s. Details of this are also not available at present. However she thinks she has had a biopsy in the past  She thinks that sometimes she will feel a swelling in the left lower neck and see it also but does not complain of any discomfort Does not feel any local pressure or difficulty with breathing or swallowing    Wt Readings from Last 3 Encounters:  08/16/16 135 lb 3.2 oz (61.3 kg)  08/10/16 134 lb 12.8 oz (61.1 kg)  05/09/16 127 lb 9.6 oz (57.9 kg)    Allergies as of 08/16/2016      Reactions   Atorvastatin Other (See Comments)   Gas pain in abdomen with no relief   Iohexol Hives, Rash   Red dye   Viberzi [eluxadoline] Nausea And Vomiting      Medication List       Accurate as  of 08/16/16  3:27 PM. Always use your most recent med list.          albuterol 108 (90 Base) MCG/ACT inhaler Commonly known as:  PROVENTIL HFA;VENTOLIN HFA Inhale 1-2 puffs into the lungs every 6 (six) hours as needed for wheezing or shortness of breath.   ALIGN PO Take by mouth daily.   CALCIUM PO Take 1 tablet by mouth daily.   cetirizine 10 MG tablet Commonly known as:  ZYRTEC Take 1 tablet (10 mg total) by mouth daily.   hyoscyamine 0.125 MG tablet Commonly known as:  LEVSIN, ANASPAZ Take 0.125 mg by mouth 2 (two) times daily.   SYMAX-SR 0.375 MG 12 hr tablet Generic drug:  hyoscyamine   KLOR-CON 10 10 MEQ tablet Generic drug:  potassium chloride TAKE AS INSTRUCTED BY YOUR PRESCRIBER   levothyroxine 50 MCG tablet Commonly known as:  SYNTHROID, LEVOTHROID TAKE ONE AND ONE-HALF (1 & 1/2) TABLETS DAILY   metoprolol tartrate 25 MG tablet Commonly known as:  LOPRESSOR Take 1.5 tablets (37.5 mg total) by mouth 2 (two) times daily.   nicotine 7 mg/24hr patch Commonly known as:  NICODERM CQ - dosed in mg/24 hr Place 1 patch (7 mg total) onto the skin daily.   nitroGLYCERIN 0.4 MG  SL tablet Commonly known as:  NITROSTAT Place 1 tablet (0.4 mg total) under the tongue every 5 (five) minutes as needed for chest pain.   rosuvastatin 20 MG tablet Commonly known as:  CRESTOR Take 1 tablet (20 mg total) by mouth daily.   TUSSIN PO Take 30 mLs by mouth every 4 (four) hours as needed. For cough   XARELTO 20 MG Tabs tablet Generic drug:  rivaroxaban TAKE 1 TABLET DAILY WITH SUPPER       Allergies:  Allergies  Allergen Reactions  . Atorvastatin Other (See Comments)    Gas pain in abdomen with no relief  . Iohexol Hives and Rash    Red dye  . Viberzi [Eluxadoline] Nausea And Vomiting    Past Medical History:  Diagnosis Date  . CAD in native artery 1998   Followup 1 month post MI revealed 95% ISR in PTCA site --> PCI with 3.0 mm x 25 mm BMS stent in proximal LAD  --> 5 months later recurrent ISR of LAD BMS that compromised D1 --> referred for CABG .--> Emergent redo single vessel CABG with SVG-mLAD for occluded LIMA  . COPD (chronic obstructive pulmonary disease) ([place])   . GERD (gastroesophageal reflux disease)   . Hyperlipidemia   . Hypothyroidism    On Synthroid  . Iron deficiency anemia   . Irritable bowel syndrome    Followed by Dr. Juanita Craver.  Marland Kitchen PAF (paroxysmal atrial fibrillation) ([place]) 05/03/2014   New onset - originally with RVR  . S/P CABG x 2 07/1996   Initially LIMA-LAD, SVG-D1 --> immediate LIMA failure post CABG with anterior MI --> urgent redo SVG-LAD beyond D2.; Widely patent grafts as of 2005 with left dominant system. Graft to the diagonal branch has a very small target vessel but the vein graft to LAD retrograde fills a large bifurcating D2 and antegrade fills a large D3.  . ST elevation (STEMI) myocardial infarction involving left anterior descending coronary artery ([place]) 60/73/7106   Complicated by VF arrest, and anoxic brain injury with status epilepticus; POBA of LAD --> 6 months later, after 2 vessel CABG she had postop complication with occluded LIMA/second anterior STEMI  . Tobacco abuse     Past Surgical History:  Procedure Laterality Date  . APPENDECTOMY    . CARDIAC CATHETERIZATION  07/30/1996   normal L main, LAD w/95% stenosis just before stent, diffuse 80-85% end-stent restenosis, 1st diagonal with70-75% ostial stenosis, large optional diagonal that was normal, Cfx with 2 marginals and distal PLA (all normal), RCA normal (Dr. Marella Chimes)  . CARDIAC CATHETERIZATION  01/31/2003   LAD totally occluded in prox 3rd, patent Cfx, SVG to mid LAd patent, SVG to small DX1 patent, LIMA to LAD was atretic and essentially occluded in junction of prox & mid 3rd, R barciocephalic and subclavian were normal (Dr. Marella Chimes)  . CESAREAN SECTION    . CHOLECYSTECTOMY    . CORONARY ANGIOPLASTY  01/19/1996   acute anterior wall MI,  anoxic encephalopahty, VF; LCfx free of disease and dominant, RCA patent, L main short and patent, LAD with 70-80% narrowing and focal 95% stenosis in distal 3rd with balloon angioplasty (Dr. Marella Chimes)  . CORONARY ANGIOPLASTY WITH STENT PLACEMENT  02/26/1996   3.0x38mm Multi-Link stent to prox/mid LAD; L main normal & short, Cfx dominant and normal, PDA & PLA normal, RCA non-dominant with small PLA and normal (Dr. Marella Chimes)  . CORONARY ARTERY BYPASS GRAFT  07/31/1996   x2; LIMA to LAD and  SVG to 1st diagonal (Dr. Remus Loffler)  . CORONARY ARTERY BYPASS GRAFT  07/31/1996   x1; SVG to distal LAD (Dr. Remus Loffler)  . NM MYOCAR PERF WALL MOTION  10/2010; 06/2014   a) LexiScan Cardiolite - mild perfusion defect r/t infarct/scar with mild periifarct ischemia in mid anterior & apical anterior region; EF 67%; abnormal, low risk study;; b) Small, moderate intensity Fixed perfusion defect - mid Anterior Wall c/w known Anterior MI. LOW RISK   . TRANSTHORACIC ECHOCARDIOGRAM  02/2012; 06/2014   a) EF 50-55%, mild HK of anterospetal myocardium; mild MR;; b) EF 55-60%, mild HK of apical anterior wall.   Mild MR    Family History  Problem Relation Age of Onset  . Heart failure Other   . Diabetes Other   . Diabetes Mother   . Thyroid disease Paternal Aunt   . Thyroid disease Maternal Grandmother     Social History:  reports that she has been smoking Cigarettes.  She has been smoking about 0.25 packs per day. She uses smokeless tobacco. She reports that she does not drink alcohol. Her drug history is not on file.  REVIEW Of SYSTEMS:  History of hyperlipidemia: Was treated with Crestor, no recent labs available, followed by PCP or cardiologist   Lab Results  Component Value Date   CHOL 209 (H) 10/16/2015   HDL 53 10/16/2015   LDLCALC 125 10/16/2015   TRIG 155 (H) 10/16/2015   CHOLHDL 3.9 10/16/2015   She continues to be on Xarelto for PAF    Examination:   BP 114/64   Pulse 64   Ht 5\' 2"  (1.575 m)    Wt 135 lb 3.2 oz (61.3 kg)   SpO2 98%   BMI 24.73 kg/m   She looks well  Thyroid palpable mostly on swallowing on the left side medially, rounded and smooth, measuring about 2. 5-3 centimeters.  Right lobe not clearly palpable Right biceps reflexes normal No peripheral edema    Assessment   Hypothyroidism, post ablative  Her TSH is back to normal  Consistently with reducing her dose to 75 g  subjectively doing well Has been compliant with the dose daily in the mornings    MULTINODULAR goiter: Long-standing and stable clinically, has mostly a smooth nodule on the left medial lobe as before   Treatment:  Continue 75 g levothyroxine, will need to see her back in 6 months because of her significant weight change in a slight trend in her TSH getting higher with this  Reassured her that she can have a colonoscopy done safely with leaving Xarelto off for 2 days as advised by cardiologist  Total visit time 15 minutes   Jaelynn Currier 08/16/2016, 3:27 PM

## 2016-08-28 ENCOUNTER — Other Ambulatory Visit: Payer: Self-pay | Admitting: Endocrinology

## 2016-08-30 ENCOUNTER — Other Ambulatory Visit: Payer: Self-pay

## 2016-08-30 MED ORDER — CETIRIZINE HCL 10 MG PO TABS: 10.0000 mg | ORAL_TABLET | Freq: Every day | ORAL | 1 refills | Status: DC

## 2016-09-03 ENCOUNTER — Other Ambulatory Visit: Payer: Self-pay | Admitting: Endocrinology

## 2016-09-07 DIAGNOSIS — Z86010 Personal history of colonic polyps: Secondary | ICD-10-CM | POA: Diagnosis not present

## 2016-09-07 DIAGNOSIS — K635 Polyp of colon: Secondary | ICD-10-CM | POA: Diagnosis not present

## 2016-09-07 DIAGNOSIS — Z1211 Encounter for screening for malignant neoplasm of colon: Secondary | ICD-10-CM | POA: Diagnosis not present

## 2016-09-07 DIAGNOSIS — D122 Benign neoplasm of ascending colon: Secondary | ICD-10-CM | POA: Diagnosis not present

## 2016-09-07 DIAGNOSIS — D125 Benign neoplasm of sigmoid colon: Secondary | ICD-10-CM | POA: Diagnosis not present

## 2016-09-07 LAB — HM COLONOSCOPY

## 2016-09-23 ENCOUNTER — Telehealth: Payer: Self-pay | Admitting: Cardiology

## 2016-09-23 NOTE — Telephone Encounter (Signed)
Pt c/o of Chest Pain: STAT if CP now or developed within 24 hours  1. Are you having CP right now? Yes   2. Are you experiencing any other symptoms (ex. SOB, nausea, vomiting, sweating)? No  3. How long have you been experiencing CP?3 days   4. Is your CP continuous or coming and going?coming and going   5. Have you taken Nitroglycerin? no ?

## 2016-09-23 NOTE — Telephone Encounter (Signed)
Returned call to patient. She reports she only called to schedule appt.  She was reporting chest pain symptoms, so I explained she was triaged for this purpose.  Pt c/o Chest pressure/pain x2 days, burning sensation, tightness.  Reports she took BPs, 139/94 today.  I asked about dizziness, lightheadedness, fatigue, etc. She denies these. Pt does endorse shortness of breath.   Recommended ED eval at [facility] - she voiced agreement. Her husband will drive her.  Called Trish to inform.  Routed to Dr. Ellyn Hack as Juluis Rainier

## 2016-09-30 ENCOUNTER — Encounter: Payer: Self-pay | Admitting: Physician Assistant

## 2016-09-30 ENCOUNTER — Other Ambulatory Visit: Payer: Self-pay | Admitting: Physician Assistant

## 2016-09-30 ENCOUNTER — Ambulatory Visit (INDEPENDENT_AMBULATORY_CARE_PROVIDER_SITE_OTHER): Payer: Medicare Other | Admitting: Physician Assistant

## 2016-09-30 ENCOUNTER — Telehealth: Payer: Self-pay | Admitting: Physician Assistant

## 2016-09-30 VITALS — BP 112/76 | HR 53 | Ht 62.0 in | Wt 135.2 lb

## 2016-09-30 DIAGNOSIS — Z951 Presence of aortocoronary bypass graft: Secondary | ICD-10-CM

## 2016-09-30 DIAGNOSIS — R079 Chest pain, unspecified: Secondary | ICD-10-CM | POA: Diagnosis not present

## 2016-09-30 DIAGNOSIS — I2510 Atherosclerotic heart disease of native coronary artery without angina pectoris: Secondary | ICD-10-CM | POA: Diagnosis not present

## 2016-09-30 DIAGNOSIS — I480 Paroxysmal atrial fibrillation: Secondary | ICD-10-CM

## 2016-09-30 DIAGNOSIS — E039 Hypothyroidism, unspecified: Secondary | ICD-10-CM | POA: Diagnosis not present

## 2016-09-30 DIAGNOSIS — Z79899 Other long term (current) drug therapy: Secondary | ICD-10-CM

## 2016-09-30 DIAGNOSIS — E785 Hyperlipidemia, unspecified: Secondary | ICD-10-CM

## 2016-09-30 MED ORDER — NITROGLYCERIN 0.4 MG SL SUBL
0.4000 mg | SUBLINGUAL_TABLET | SUBLINGUAL | 1 refills | Status: DC | PRN
Start: 2016-09-30 — End: 2019-12-31

## 2016-09-30 NOTE — Patient Instructions (Addendum)
Medication Instructions:  Your physician recommends that you continue on your current medications as directed. Please refer to the Current Medication list given to you today.  Labwork: TODAY:  STAT I TROPONIN  AT TIME OF APPT:  FASTING LIPID AND LFT  Testing/Procedures: None ordered  Follow-Up: Your physician recommends that you schedule a follow-up appointment in: 3 WEEKS WITH HAO MENG, PA-C WITH FASTING LIPID PANEL AND LFT LAB WORK   Any Other Special Instructions Will Be Listed Below (If Applicable).   If you need a refill on your cardiac medications before your next appointment, please call your pharmacy.

## 2016-09-30 NOTE — Progress Notes (Signed)
Cardiology Office Note    Date:  10/02/2016   ID:  TAMEA BAI, DOB Jun 08, 1955, MRN [id number]  PCP:  System, Pcp Not In  Cardiologist:  Dr. Ellyn Hack  Chief Complaint  Patient presents with  . Follow-up    seen for Dr. Ellyn Hack.     History of Present Illness:  Dominique Barber is a 61 y.o. female with PMH of CAD s/p 2v CABG 1998, HLD, GERD, COPD, Hypothyroidism, PAF, and history of tobacco abuse. She had a history of anterior STEMI in 1998 at age 55, this was conjugated by VF arrest with cardiac shock and mild anoxic brain injury. She underwent emergent PTCA of LAD and bare-metal stent, shortly thereafter, she underwent CABG with LIMA to LAD and SVG to D2 for in-stent restenosis. Postop admission was complicated by acute occlusion of the LIMA graft resulting in recurrent anterior MI and underwent redo CABG with SVG to mid LAD. Most recent cardiac cath in January 2005 showed patent SVG to D1 and SVG to mid LAD. LIMA to LAD was atretic and occluded. Myoview in June 2016 showed small mid anterior wall scar without ischemia, overall low risk study. She has been diagnosed with atrial fibrillation since April 2016 and currently takes Xarelto.  Patient presents today for cardiology office visit. Since September 12, she is has been having intermittent soreness in the chest. She does not remember her previous anginal symptom. She says this soreness does not associate with exertion. She denies any dizziness, shortness of breath or fatigue. It recurs intermittently. She thought it was her bra size, so she changed her bra and her symptom resolved. She says the symptom was not associated with deep inspiration or palpation. For the past 2 days she has not had any further symptom. She has been able to ambulate without any shortness of breath or chest discomfort. I recommended a stat troponin to rule out recent cardiac event, if negative, we will plan to see the patient back in 3 weeks for reassessment.  However if she has any recurrence of the chest discomfort, I would recommend Lexiscan Myoview.   Past Medical History:  Diagnosis Date  . CAD in native artery 1998   Followup 1 month post MI revealed 95% ISR in PTCA site --> PCI with 3.0 mm x 25 mm BMS stent in proximal LAD --> 5 months later recurrent ISR of LAD BMS that compromised D1 --> referred for CABG .--> Emergent redo single vessel CABG with SVG-mLAD for occluded LIMA  . COPD (chronic obstructive pulmonary disease) ([place])   . GERD (gastroesophageal reflux disease)   . Hyperlipidemia   . Hypothyroidism    On Synthroid  . Iron deficiency anemia   . Irritable bowel syndrome    Followed by Dr. Juanita Craver.  Marland Kitchen PAF (paroxysmal atrial fibrillation) ([place]) 05/03/2014   New onset - originally with RVR  . S/P CABG x 2 07/1996   Initially LIMA-LAD, SVG-D1 --> immediate LIMA failure post CABG with anterior MI --> urgent redo SVG-LAD beyond D2.; Widely patent grafts as of 2005 with left dominant system. Graft to the diagonal branch has a very small target vessel but the vein graft to LAD retrograde fills a large bifurcating D2 and antegrade fills a large D3.  . ST elevation (STEMI) myocardial infarction involving left anterior descending coronary artery ([place]) 93/79/0240   Complicated by VF arrest, and anoxic brain injury with status epilepticus; POBA of LAD --> 6 months later, after 2 vessel CABG she had postop  complication with occluded LIMA/second anterior STEMI  . Tobacco abuse     Past Surgical History:  Procedure Laterality Date  . APPENDECTOMY    . CARDIAC CATHETERIZATION  07/30/1996   normal L main, LAD w/95% stenosis just before stent, diffuse 80-85% end-stent restenosis, 1st diagonal with70-75% ostial stenosis, large optional diagonal that was normal, Cfx with 2 marginals and distal PLA (all normal), RCA normal (Dr. Marella Chimes)  . CARDIAC CATHETERIZATION  01/31/2003   LAD totally occluded in prox 3rd, patent Cfx, SVG to mid LAd  patent, SVG to small DX1 patent, LIMA to LAD was atretic and essentially occluded in junction of prox & mid 3rd, R barciocephalic and subclavian were normal (Dr. Marella Chimes)  . CESAREAN SECTION    . CHOLECYSTECTOMY    . CORONARY ANGIOPLASTY  01/19/1996   acute anterior wall MI, anoxic encephalopahty, VF; LCfx free of disease and dominant, RCA patent, L main short and patent, LAD with 70-80% narrowing and focal 95% stenosis in distal 3rd with balloon angioplasty (Dr. Marella Chimes)  . CORONARY ANGIOPLASTY WITH STENT PLACEMENT  02/26/1996   3.0x79mm Multi-Link stent to prox/mid LAD; L main normal & short, Cfx dominant and normal, PDA & PLA normal, RCA non-dominant with small PLA and normal (Dr. Marella Chimes)  . CORONARY ARTERY BYPASS GRAFT  07/31/1996   x2; LIMA to LAD and SVG to 1st diagonal (Dr. Remus Loffler)  . CORONARY ARTERY BYPASS GRAFT  07/31/1996   x1; SVG to distal LAD (Dr. Remus Loffler)  . NM MYOCAR PERF WALL MOTION  10/2010; 06/2014   a) LexiScan Cardiolite - mild perfusion defect r/t infarct/scar with mild periifarct ischemia in mid anterior & apical anterior region; EF 67%; abnormal, low risk study;; b) Small, moderate intensity Fixed perfusion defect - mid Anterior Wall c/w known Anterior MI. LOW RISK   . TRANSTHORACIC ECHOCARDIOGRAM  02/2012; 06/2014   a) EF 50-55%, mild HK of anterospetal myocardium; mild MR;; b) EF 55-60%, mild HK of apical anterior wall.   Mild MR    Current Medications: Outpatient Medications Prior to Visit  Medication Sig Dispense Refill  . albuterol (PROVENTIL HFA;VENTOLIN HFA) 108 (90 BASE) MCG/ACT inhaler Inhale 1-2 puffs into the lungs every 6 (six) hours as needed for wheezing or shortness of breath. 1 Inhaler 0  . CALCIUM PO Take 1 tablet by mouth daily.     . cetirizine (ZYRTEC) 10 MG tablet Take 1 tablet (10 mg total) by mouth daily. 90 tablet 1  . GuaiFENesin (TUSSIN PO) Take 30 mLs by mouth every 4 (four) hours as needed. For cough    . hyoscyamine (LEVSIN,  ANASPAZ) 0.125 MG tablet Take 0.125 mg by mouth 2 (two) times daily.     Marland Kitchen KLOR-CON 10 10 MEQ tablet TAKE AS INSTRUCTED BY YOUR PRESCRIBER 180 tablet 36  . levothyroxine (SYNTHROID, LEVOTHROID) 50 MCG tablet TAKE ONE AND ONE-HALF (1 & 1/2) TABLETS DAILY 135 tablet 1  . metoprolol tartrate (LOPRESSOR) 25 MG tablet Take 1.5 tablets (37.5 mg total) by mouth 2 (two) times daily. 270 tablet 3  . nicotine (NICODERM CQ - DOSED IN MG/24 HR) 7 mg/24hr patch Place 1 patch (7 mg total) onto the skin daily. 28 patch 2  . Probiotic Product (ALIGN PO) Take by mouth daily.    Alveda Reasons 20 MG TABS tablet TAKE 1 TABLET DAILY WITH SUPPER 90 tablet 1  . SYMAX-SR 0.375 MG 12 hr tablet     . rosuvastatin (CRESTOR) 20 MG tablet Take 1  tablet (20 mg total) by mouth daily. 90 tablet 3  . nitroGLYCERIN (NITROSTAT) 0.4 MG SL tablet Place 1 tablet (0.4 mg total) under the tongue every 5 (five) minutes as needed for chest pain. 25 tablet 1   No facility-administered medications prior to visit.      Allergies:   Atorvastatin; Iohexol; and Viberzi [eluxadoline]   Social History   Social History  . Marital status: Married    Spouse name: N/A  . Number of children: 3  . Years of education: N/A   Occupational History  .  Disabled   Social History Main Topics  . Smoking status: Current Every Day Smoker    Packs/day: 0.25    Types: Cigarettes  . Smokeless tobacco: Current User     Comment: Quit at time of MI, restarted in 2009  . Alcohol use No  . Drug use: Unknown  . Sexual activity: Not Asked   Other Topics Concern  . None   Social History Narrative   Married, mother of 3 children (2 living sons age 38-26 and 30-31; her daughter was the victim of a murder that took place sometime around the time of the patient's MI. She was under significant amount of stress as her daughter had gone missing. She was never found. Finally the suspect was charged and convicted in 2009. She had sessile he stop smoking until that  time frame when it brought back memories and she is now back to smoking a half pack a day.   She does not get routine exercise, but is active around the house and doing housecleaning chores.   She does not drink alcohol.     Family History:  The patient's family history includes Diabetes in her mother and other; Heart failure in her other; Thyroid disease in her maternal grandmother and paternal aunt.   ROS:   Please see the history of present illness.    ROS All other systems reviewed and are negative.   PHYSICAL EXAM:   VS:  BP 112/76   Pulse (!) 53   Ht 5\' 2"  (1.575 m)   Wt 135 lb 3.2 oz (61.3 kg)   BMI 24.73 kg/m    GEN: Well nourished, well developed, in no acute distress  HEENT: normal  Neck: no JVD, carotid bruits, or masses Cardiac: RRR; no murmurs, rubs, or gallops,no edema  Respiratory:  clear to auscultation bilaterally, normal work of breathing GI: soft, nontender, nondistended, + BS MS: no deformity or atrophy  Skin: warm and dry, no rash Neuro:  Alert and Oriented x 3, Strength and sensation are intact Psych: euthymic mood, full affect  Wt Readings from Last 3 Encounters:  09/30/16 135 lb 3.2 oz (61.3 kg)  08/16/16 135 lb 3.2 oz (61.3 kg)  08/10/16 134 lb 12.8 oz (61.1 kg)      Studies/Labs Reviewed:   EKG:  EKG is ordered today.  The ekg ordered today demonstrates Sinus rhythm, poor R-wave progression in anterior leads.  Recent Labs: 10/16/2015: ALT 14; BUN 15; Creat 0.83; Potassium 4.8; Sodium 142 08/10/2016: TSH 2.83   Lipid Panel    Component Value Date/Time   CHOL 209 (H) 10/16/2015 1221   TRIG 155 (H) 10/16/2015 1221   HDL 53 10/16/2015 1221   CHOLHDL 3.9 10/16/2015 1221   VLDL 31 (H) 10/16/2015 1221   LDLCALC 125 10/16/2015 1221    Additional studies/ records that were reviewed today include:   Myoview 06/24/2014 Study Highlights    The left ventricular  ejection fraction is hyperdynamic (>65%).  Defect 1: There is a small defect of  moderate severity present in the mid anterior location.  There was no ST segment deviation noted during stress.   Small mid anterior wall scar without ischemia. Low risk study      ASSESSMENT:    1. S/P CABG x 2   2. Hyperlipidemia with target LDL less than 70   3. Paroxysmal atrial fibrillation ([place]); CHA2DS2-VASc Score 3. On Xarelto   4. Medication management   5. Chest pain, unspecified type   6. Hypothyroidism, unspecified type      PLAN:  In order of problems listed above:  1. CAD s/p CABG: He had several days of intermittent chest discomfort which she attributed to her bra size, since she changed her brow, her symptom has resolved. She has not had any further chest discomfort in the last 2 days despite exertional activity. Her intermittent chest discomfort did not occur with exertion. Symptom appears to be quite atypical. I offered her both monitoring versus stress testing, she prefer monitoring for the next few weeks, if recur then proceed with stress test. I will obtain a troponin today to rule out recent cardiac event.  2. Hyperlipidemia: Continue on Crestor  3. PAF on Xarelto: No obvious recurrence  4. Hypothyroidism: On Synthroid    Medication Adjustments/Labs and Tests Ordered: Current medicines are reviewed at length with the patient today.  Concerns regarding medicines are outlined above.  Medication changes, Labs and Tests ordered today are listed in the Patient Instructions below. Patient Instructions  Medication Instructions:  Your physician recommends that you continue on your current medications as directed. Please refer to the Current Medication list given to you today.  Labwork: TODAY:  STAT I TROPONIN  AT TIME OF APPT:  FASTING LIPID AND LFT  Testing/Procedures: None ordered  Follow-Up: Your physician recommends that you schedule a follow-up appointment in: 3 WEEKS WITH Dominique Jerez, PA-C WITH FASTING LIPID PANEL AND LFT LAB WORK   Any Other  Special Instructions Will Be Listed Below (If Applicable).   If you need a refill on your cardiac medications before your next appointment, please call your pharmacy.      Hilbert Corrigan, [place]  10/02/2016 10:59 AM    [facility] [place], [place], [place]  [address] Phone: [phone]; Fax: [phone]

## 2016-09-30 NOTE — Telephone Encounter (Signed)
Troponin negative, will proceed with plan for observation for now.   Hilbert Corrigan PA Pager: [phone]

## 2016-10-02 ENCOUNTER — Encounter: Payer: Self-pay | Admitting: Physician Assistant

## 2016-10-03 LAB — TROPONIN I: Troponin I: <0.01 ng/mL (ref <=0.0)

## 2016-10-31 ENCOUNTER — Encounter: Payer: Self-pay | Admitting: Physician Assistant

## 2016-10-31 ENCOUNTER — Ambulatory Visit (INDEPENDENT_AMBULATORY_CARE_PROVIDER_SITE_OTHER): Payer: Medicare Other | Admitting: Physician Assistant

## 2016-10-31 VITALS — BP 130/80 | HR 70 | Ht 62.0 in | Wt 135.0 lb

## 2016-10-31 DIAGNOSIS — I480 Paroxysmal atrial fibrillation: Secondary | ICD-10-CM | POA: Diagnosis not present

## 2016-10-31 DIAGNOSIS — Z79899 Other long term (current) drug therapy: Secondary | ICD-10-CM | POA: Diagnosis not present

## 2016-10-31 DIAGNOSIS — I2510 Atherosclerotic heart disease of native coronary artery without angina pectoris: Secondary | ICD-10-CM

## 2016-10-31 DIAGNOSIS — E039 Hypothyroidism, unspecified: Secondary | ICD-10-CM

## 2016-10-31 DIAGNOSIS — E785 Hyperlipidemia, unspecified: Secondary | ICD-10-CM | POA: Diagnosis not present

## 2016-10-31 DIAGNOSIS — Z951 Presence of aortocoronary bypass graft: Secondary | ICD-10-CM | POA: Diagnosis not present

## 2016-10-31 NOTE — Patient Instructions (Signed)
Medication Instructions:  Continue current medications  If you need a refill on your cardiac medications before your next appointment, please call your pharmacy.  Labwork: Fasting Labs  Testing/Procedures: None Ordered   Follow-Up: Your physician wants you to follow-up in: 6 Months with Dr Ellyn Hack. You should receive a reminder letter in the mail two months in advance. If you do not receive a letter, please call our office [phone].    Thank you for choosing [facility] at [facility]!!

## 2016-10-31 NOTE — Progress Notes (Signed)
Cardiology Office Note    Date:  11/02/2016   ID:  Dominique Barber, DOB 09/13/1955, MRN [id number]  PCP:  System, Pcp Not In  Cardiologist:  Dr. Ellyn Hack  Chief Complaint  Patient presents with  . Follow-up    PT C/O NUMBNESS IN FINGERS, COMES AND GOES FOR 3 DAYS.     History of Present Illness:  Dominique Barber is a 61 y.o. female with PMH of CAD s/p 2v CABG 1998, HLD, GERD, COPD, Hypothyroidism, PAF, and history of tobacco abuse. She had a history of anterior STEMI in 1998 at age 67, this was complicated by VF arrest with cardiac shock and mild anoxic brain injury. She underwent emergent PTCA of LAD and bare-metal stent, shortly thereafter, she underwent CABG with LIMA to LAD and SVG to D2 for in-stent restenosis. Postop admission was complicated by acute occlusion of the LIMA graft resulting in recurrent anterior MI and underwent redo CABG with SVG to mid LAD. Most recent cardiac cath in January 2005 showed patent SVG to D1 and SVG to mid LAD. LIMA to LAD was atretic and occluded. Myoview in June 2016 showed small mid anterior wall scar without ischemia, overall low risk study. She has been diagnosed with atrial fibrillation since April 2016 and currently takes Xarelto.  I last saw the patient on 09/22/2016, she was having some intermittent soreness of the chest. She does not remember her previous anginal symptoms. The soreness was not associated with any exertion. She was able to ambulate without shortness breath or chest discomfort. A stat troponin was negative for cardiac issue. She presents today for reassessment of his symptom. She is no longer having any chest discomfort. She occasionally has some numbness in her hand, however, she appears to have good blood supply and perfusion. Given lack of any further chest discomfort, I would not recommend any further workup.    Past Medical History:  Diagnosis Date  . CAD in native artery 1998   Followup 1 month post MI revealed 95% ISR  in PTCA site --> PCI with 3.0 mm x 25 mm BMS stent in proximal LAD --> 5 months later recurrent ISR of LAD BMS that compromised D1 --> referred for CABG .--> Emergent redo single vessel CABG with SVG-mLAD for occluded LIMA  . COPD (chronic obstructive pulmonary disease) ([place])   . GERD (gastroesophageal reflux disease)   . Hyperlipidemia   . Hypothyroidism    On Synthroid  . Iron deficiency anemia   . Irritable bowel syndrome    Followed by Dr. Juanita Craver.  Marland Kitchen PAF (paroxysmal atrial fibrillation) ([place]) 05/03/2014   New onset - originally with RVR  . S/P CABG x 2 07/1996   Initially LIMA-LAD, SVG-D1 --> immediate LIMA failure post CABG with anterior MI --> urgent redo SVG-LAD beyond D2.; Widely patent grafts as of 2005 with left dominant system. Graft to the diagonal branch has a very small target vessel but the vein graft to LAD retrograde fills a large bifurcating D2 and antegrade fills a large D3.  . ST elevation (STEMI) myocardial infarction involving left anterior descending coronary artery ([place]) 45/80/9983   Complicated by VF arrest, and anoxic brain injury with status epilepticus; POBA of LAD --> 6 months later, after 2 vessel CABG she had postop complication with occluded LIMA/second anterior STEMI  . Tobacco abuse     Past Surgical History:  Procedure Laterality Date  . APPENDECTOMY    . CARDIAC CATHETERIZATION  07/30/1996   normal L main,  LAD w/95% stenosis just before stent, diffuse 80-85% end-stent restenosis, 1st diagonal with70-75% ostial stenosis, large optional diagonal that was normal, Cfx with 2 marginals and distal PLA (all normal), RCA normal (Dr. Marella Chimes)  . CARDIAC CATHETERIZATION  01/31/2003   LAD totally occluded in prox 3rd, patent Cfx, SVG to mid LAd patent, SVG to small DX1 patent, LIMA to LAD was atretic and essentially occluded in junction of prox & mid 3rd, R barciocephalic and subclavian were normal (Dr. Marella Chimes)  . CESAREAN SECTION    . CHOLECYSTECTOMY     . CORONARY ANGIOPLASTY  01/19/1996   acute anterior wall MI, anoxic encephalopahty, VF; LCfx free of disease and dominant, RCA patent, L main short and patent, LAD with 70-80% narrowing and focal 95% stenosis in distal 3rd with balloon angioplasty (Dr. Marella Chimes)  . CORONARY ANGIOPLASTY WITH STENT PLACEMENT  02/26/1996   3.0x85mm Multi-Link stent to prox/mid LAD; L main normal & short, Cfx dominant and normal, PDA & PLA normal, RCA non-dominant with small PLA and normal (Dr. Marella Chimes)  . CORONARY ARTERY BYPASS GRAFT  07/31/1996   x2; LIMA to LAD and SVG to 1st diagonal (Dr. Remus Loffler)  . CORONARY ARTERY BYPASS GRAFT  07/31/1996   x1; SVG to distal LAD (Dr. Remus Loffler)  . NM MYOCAR PERF WALL MOTION  10/2010; 06/2014   a) LexiScan Cardiolite - mild perfusion defect r/t infarct/scar with mild periifarct ischemia in mid anterior & apical anterior region; EF 67%; abnormal, low risk study;; b) Small, moderate intensity Fixed perfusion defect - mid Anterior Wall c/w known Anterior MI. LOW RISK   . TRANSTHORACIC ECHOCARDIOGRAM  02/2012; 06/2014   a) EF 50-55%, mild HK of anterospetal myocardium; mild MR;; b) EF 55-60%, mild HK of apical anterior wall.   Mild MR    Current Medications: Outpatient Medications Prior to Visit  Medication Sig Dispense Refill  . albuterol (PROVENTIL HFA;VENTOLIN HFA) 108 (90 BASE) MCG/ACT inhaler Inhale 1-2 puffs into the lungs every 6 (six) hours as needed for wheezing or shortness of breath. 1 Inhaler 0  . CALCIUM PO Take 1 tablet by mouth daily.     . cetirizine (ZYRTEC) 10 MG tablet Take 1 tablet (10 mg total) by mouth daily. 90 tablet 1  . cholestyramine (QUESTRAN) 4 g packet Take 1 packet by mouth daily.    . GuaiFENesin (TUSSIN PO) Take 30 mLs by mouth every 4 (four) hours as needed. For cough    . hyoscyamine (LEVSIN, ANASPAZ) 0.125 MG tablet Take 0.125 mg by mouth 2 (two) times daily.     Marland Kitchen KLOR-CON 10 10 MEQ tablet TAKE AS INSTRUCTED BY YOUR PRESCRIBER 180 tablet 36    . levothyroxine (SYNTHROID, LEVOTHROID) 50 MCG tablet TAKE ONE AND ONE-HALF (1 & 1/2) TABLETS DAILY 135 tablet 1  . metoprolol tartrate (LOPRESSOR) 25 MG tablet Take 1.5 tablets (37.5 mg total) by mouth 2 (two) times daily. 270 tablet 3  . nicotine (NICODERM CQ - DOSED IN MG/24 HR) 7 mg/24hr patch Place 1 patch (7 mg total) onto the skin daily. 28 patch 2  . Probiotic Product (ALIGN PO) Take by mouth daily.    Alveda Reasons 20 MG TABS tablet TAKE 1 TABLET DAILY WITH SUPPER 90 tablet 1  . nitroGLYCERIN (NITROSTAT) 0.4 MG SL tablet Place 1 tablet (0.4 mg total) under the tongue every 5 (five) minutes as needed for chest pain. 25 tablet 1  . rosuvastatin (CRESTOR) 20 MG tablet Take 1 tablet (20 mg  total) by mouth daily. 90 tablet 3   No facility-administered medications prior to visit.      Allergies:   Atorvastatin; Iohexol; and Viberzi [eluxadoline]   Social History   Social History  . Marital status: Married    Spouse name: N/A  . Number of children: 3  . Years of education: N/A   Occupational History  .  Disabled   Social History Main Topics  . Smoking status: Former Smoker    Packs/day: 0.25    Types: Cigarettes  . Smokeless tobacco: Former Systems developer     Comment: Quit at time of MI, restarted in 2009  . Alcohol use No  . Drug use: Unknown  . Sexual activity: Not Asked   Other Topics Concern  . None   Social History Narrative   Married, mother of 3 children (2 living sons age 66-26 and 30-31; her daughter was the victim of a murder that took place sometime around the time of the patient's MI. She was under significant amount of stress as her daughter had gone missing. She was never found. Finally the suspect was charged and convicted in 2009. She had sessile he stop smoking until that time frame when it brought back memories and she is now back to smoking a half pack a day.   She does not get routine exercise, but is active around the house and doing housecleaning chores.   She does  not drink alcohol.     Family History:  The patient's family history includes Diabetes in her mother and other; Heart failure in her other; Thyroid disease in her maternal grandmother and paternal aunt.   ROS:   Please see the history of present illness.    ROS All other systems reviewed and are negative.   PHYSICAL EXAM:   VS:  BP 130/80 (BP Location: Right Arm, Patient Position: Sitting, Cuff Size: Normal)   Pulse 70   Ht 5\' 2"  (1.575 m)   Wt 135 lb (61.2 kg)   BMI 24.69 kg/m    GEN: Well nourished, well developed, in no acute distress  HEENT: normal  Neck: no JVD, carotid bruits, or masses Cardiac: RRR; no murmurs, rubs, or gallops,no edema  Respiratory:  clear to auscultation bilaterally, normal work of breathing GI: soft, nontender, nondistended, + BS MS: no deformity or atrophy  Skin: warm and dry, no rash Neuro:  Alert and Oriented x 3, Strength and sensation are intact Psych: euthymic mood, full affect  Wt Readings from Last 3 Encounters:  10/31/16 135 lb (61.2 kg)  09/30/16 135 lb 3.2 oz (61.3 kg)  08/16/16 135 lb 3.2 oz (61.3 kg)      Studies/Labs Reviewed:   EKG:  EKG is not ordered today.   Recent Labs: 08/10/2016: TSH 2.83 10/31/2016: ALT 18   Lipid Panel    Component Value Date/Time   CHOL 209 (H) 10/16/2015 1221   TRIG 155 (H) 10/16/2015 1221   HDL 53 10/16/2015 1221   CHOLHDL 3.9 10/16/2015 1221   VLDL 31 (H) 10/16/2015 1221   LDLCALC 125 10/16/2015 1221    Additional studies/ records that were reviewed today include:   Myoview 06/24/2014 Study Highlights    The left ventricular ejection fraction is hyperdynamic (>65%).  Defect 1: There is a small defect of moderate severity present in the mid anterior location.  There was no ST segment deviation noted during stress.  Small mid anterior wall scar without ischemia. Low risk study      ASSESSMENT:  1. S/P CABG x 2   2. Hyperlipidemia with target LDL less than 70   3. Medication  management   4. Paroxysmal atrial fibrillation ([place]); CHA2DS2-VASc Score 3. On Xarelto   5. Hypothyroidism, unspecified type      PLAN:  In order of problems listed above:  1. CAD s/p CABG: Denies any further chest discomfort since last office visit. No further workup is needed given the atypical nature of recent chest pain.  2. PAF on Xarelto: No obvious recurrence  3. Hyperlipidemia: Continue Crestor, we'll need fasting lipid panel and LFTs  4. Hypothyroidism: On Synthroid    Medication Adjustments/Labs and Tests Ordered: Current medicines are reviewed at length with the patient today.  Concerns regarding medicines are outlined above.  Medication changes, Labs and Tests ordered today are listed in the Patient Instructions below. Patient Instructions  Medication Instructions:  Continue current medications  If you need a refill on your cardiac medications before your next appointment, please call your pharmacy.  Labwork: Fasting Labs  Testing/Procedures: None Ordered   Follow-Up: Your physician wants you to follow-up in: 6 Months with Dr Ellyn Hack. You should receive a reminder letter in the mail two months in advance. If you do not receive a letter, please call our office [phone].    Thank you for choosing [facility], [place], [place]  11/02/2016 6:36 AM    [address], [place], [place]  [address] Phone: [phone]; Fax: [phone]

## 2016-11-01 LAB — HEPATIC FUNCTION PANEL
ALT: 18 IU/L (ref 0–32)
AST: 14 IU/L (ref 0–40)
Albumin: 4.4 g/dL (ref 3.6–4.8)
Alkaline Phosphatase: 90 IU/L (ref 39–117)
Bilirubin Total: 0.3 mg/dL (ref 0.0–1.2)
Bilirubin, Direct: 0.07 mg/dL (ref 0.00–0.40)
Total Protein: 7 g/dL (ref 6.0–8.5)

## 2016-11-02 ENCOUNTER — Encounter: Payer: Self-pay | Admitting: Physician Assistant

## 2016-11-03 NOTE — Progress Notes (Signed)
Liver enzyme normal. But I do not see the lipid panel, was it drawn?

## 2016-11-10 ENCOUNTER — Other Ambulatory Visit: Payer: Self-pay | Admitting: Physician Assistant

## 2016-11-10 DIAGNOSIS — E785 Hyperlipidemia, unspecified: Secondary | ICD-10-CM

## 2016-11-10 NOTE — Progress Notes (Signed)
thanks

## 2016-11-30 ENCOUNTER — Telehealth: Payer: Self-pay | Admitting: Cardiology

## 2016-11-30 NOTE — Telephone Encounter (Signed)
The patient stated that she will come in tomorrow to have the laps repeated. She has been instructed to be fasting for this lab. She verbalized her understanding.

## 2016-11-30 NOTE — Telephone Encounter (Signed)
New message     Patient needs you to check to see if she had lab work for the Cholesterol? Someone called her and said it was not done and she thinks it was , she also needs instructions if it was not done

## 2016-12-06 DIAGNOSIS — E785 Hyperlipidemia, unspecified: Secondary | ICD-10-CM | POA: Diagnosis not present

## 2016-12-06 LAB — LIPID PANEL
Chol/HDL Ratio: 2.5 ratio (ref 0.0–4.4)
Cholesterol, Total: 140 mg/dL (ref 100–199)
HDL: 57 mg/dL (ref >39)
LDL Calculated: 59 mg/dL (ref 0–99)
Triglycerides: 119 mg/dL (ref 0–149)
VLDL Cholesterol Cal: 24 mg/dL (ref 5–40)

## 2017-01-10 ENCOUNTER — Other Ambulatory Visit: Payer: Self-pay | Admitting: Cardiology

## 2017-01-18 HISTORY — PX: LEFT HEART CATH AND CORONARY ANGIOGRAPHY: CATH118249

## 2017-02-13 ENCOUNTER — Other Ambulatory Visit (INDEPENDENT_AMBULATORY_CARE_PROVIDER_SITE_OTHER): Payer: Medicare Other

## 2017-02-13 DIAGNOSIS — E890 Postprocedural hypothyroidism: Secondary | ICD-10-CM | POA: Diagnosis not present

## 2017-02-13 LAB — T4, FREE: Free T4: 1.17 ng/dL (ref 0.60–1.60)

## 2017-02-13 LAB — TSH: TSH: 1.68 u[IU]/mL (ref 0.35–4.50)

## 2017-02-16 ENCOUNTER — Ambulatory Visit (INDEPENDENT_AMBULATORY_CARE_PROVIDER_SITE_OTHER): Payer: Medicare Other | Admitting: Endocrinology

## 2017-02-16 ENCOUNTER — Encounter: Payer: Self-pay | Admitting: Endocrinology

## 2017-02-16 VITALS — BP 130/72 | HR 65 | Ht 62.0 in | Wt 139.4 lb

## 2017-02-16 DIAGNOSIS — E042 Nontoxic multinodular goiter: Secondary | ICD-10-CM | POA: Diagnosis not present

## 2017-02-16 DIAGNOSIS — E890 Postprocedural hypothyroidism: Secondary | ICD-10-CM | POA: Diagnosis not present

## 2017-02-16 NOTE — Progress Notes (Signed)
Patient ID: CASHMERE DINGLEY, female   DOB: June 15, 1955, 62 y.o.   MRN: [id number]   Reason for Appointment:  Hypothyroidism, followup visit    History of Present Illness:   The hypothyroidism was first diagnosed in 1999 after radioactive iodine treatment for toxic nodular goiter Subsequently she became hypothyroid and has been on thyroid supplementation She has generally been on stable dose of Synthroid  On her follow-up visit in 11/16 her TSH was lower than usual at 0.13 Her dose was reduced from 100 down to 75 g     Subsequently TSH has been normal without any change needed in her doses   No recent problems with unusual fatigue or cold intolerance, palpitations Her weight has leveled off slightly higher because of her trying to quit smoking            The patient is taking the thyroid supplement consistently in the morning before breakfast very consistently.   Not taking any calcium or iron -containing supplements  In the morning  TSH levels are now consistently normal  Lab Results  Component Value Date   TSH 1.68 02/13/2017   TSH 2.83 08/10/2016   TSH 1.37 05/09/2016   FREET4 1.17 02/13/2017   FREET4 1.13 08/10/2016   FREET4 1.5 05/09/2016     GOITER:   She apparently has had a multinodular goiter since her 68s. Details of this are also not available at present. However she reported that she has had a biopsy in the past   Does not feel any local pressure or difficulty with breathing or swallowing    Wt Readings from Last 3 Encounters:  02/16/17 139 lb 6.4 oz (63.2 kg)  10/31/16 135 lb (61.2 kg)  09/30/16 135 lb 3.2 oz (61.3 kg)    Allergies as of 02/16/2017      Reactions   Atorvastatin Other (See Comments)   Gas pain in abdomen with no relief   Iohexol Hives, Rash   Red dye   Viberzi [eluxadoline] Nausea And Vomiting      Medication List        Accurate as of 02/16/17  3:46 PM. Always use your most recent med list.          albuterol  108 (90 Base) MCG/ACT inhaler Commonly known as:  PROVENTIL HFA;VENTOLIN HFA Inhale 1-2 puffs into the lungs every 6 (six) hours as needed for wheezing or shortness of breath.   ALIGN PO Take by mouth daily.   CALCIUM PO Take 1 tablet by mouth daily.   cetirizine 10 MG tablet Commonly known as:  ZYRTEC Take 1 tablet (10 mg total) by mouth daily.   cholestyramine 4 g packet Commonly known as:  QUESTRAN Take 1 packet by mouth daily.   hyoscyamine 0.125 MG tablet Commonly known as:  LEVSIN, ANASPAZ Take 0.125 mg by mouth 2 (two) times daily.   KLOR-CON 10 10 MEQ tablet Generic drug:  potassium chloride TAKE AS INSTRUCTED BY YOUR PRESCRIBER   levothyroxine 50 MCG tablet Commonly known as:  SYNTHROID, LEVOTHROID TAKE ONE AND ONE-HALF (1 & 1/2) TABLETS DAILY   metoprolol tartrate 25 MG tablet Commonly known as:  LOPRESSOR Take 1.5 tablets (37.5 mg total) by mouth 2 (two) times daily.   nicotine 7 mg/24hr patch Commonly known as:  NICODERM CQ - dosed in mg/24 hr Place 1 patch (7 mg total) onto the skin daily.   nitroGLYCERIN 0.4 MG SL tablet Commonly known as:  NITROSTAT Place 1 tablet (0.4 mg  total) under the tongue every 5 (five) minutes as needed for chest pain.   rivaroxaban 20 MG Tabs tablet Commonly known as:  XARELTO Take 1 tablet (20 mg total) by mouth daily with supper.   rosuvastatin 20 MG tablet Commonly known as:  CRESTOR Take 1 tablet (20 mg total) by mouth daily.   TUSSIN PO Take 30 mLs by mouth every 4 (four) hours as needed. For cough       Allergies:  Allergies  Allergen Reactions  . Atorvastatin Other (See Comments)    Gas pain in abdomen with no relief  . Iohexol Hives and Rash    Red dye  . Viberzi [Eluxadoline] Nausea And Vomiting    Past Medical History:  Diagnosis Date  . CAD in native artery 1998   Followup 1 month post MI revealed 95% ISR in PTCA site --> PCI with 3.0 mm x 25 mm BMS stent in proximal LAD --> 5 months later  recurrent ISR of LAD BMS that compromised D1 --> referred for CABG .--> Emergent redo single vessel CABG with SVG-mLAD for occluded LIMA  . COPD (chronic obstructive pulmonary disease) ([place])   . GERD (gastroesophageal reflux disease)   . Hyperlipidemia   . Hypothyroidism    On Synthroid  . Iron deficiency anemia   . Irritable bowel syndrome    Followed by Dr. Juanita Craver.  Marland Kitchen PAF (paroxysmal atrial fibrillation) ([place]) 05/03/2014   New onset - originally with RVR  . S/P CABG x 2 07/1996   Initially LIMA-LAD, SVG-D1 --> immediate LIMA failure post CABG with anterior MI --> urgent redo SVG-LAD beyond D2.; Widely patent grafts as of 2005 with left dominant system. Graft to the diagonal branch has a very small target vessel but the vein graft to LAD retrograde fills a large bifurcating D2 and antegrade fills a large D3.  . ST elevation (STEMI) myocardial infarction involving left anterior descending coronary artery ([place]) 62/69/4854   Complicated by VF arrest, and anoxic brain injury with status epilepticus; POBA of LAD --> 6 months later, after 2 vessel CABG she had postop complication with occluded LIMA/second anterior STEMI  . Tobacco abuse     Past Surgical History:  Procedure Laterality Date  . APPENDECTOMY    . CARDIAC CATHETERIZATION  07/30/1996   normal L main, LAD w/95% stenosis just before stent, diffuse 80-85% end-stent restenosis, 1st diagonal with70-75% ostial stenosis, large optional diagonal that was normal, Cfx with 2 marginals and distal PLA (all normal), RCA normal (Dr. Marella Chimes)  . CARDIAC CATHETERIZATION  01/31/2003   LAD totally occluded in prox 3rd, patent Cfx, SVG to mid LAd patent, SVG to small DX1 patent, LIMA to LAD was atretic and essentially occluded in junction of prox & mid 3rd, R barciocephalic and subclavian were normal (Dr. Marella Chimes)  . CESAREAN SECTION    . CHOLECYSTECTOMY    . CORONARY ANGIOPLASTY  01/19/1996   acute anterior wall MI, anoxic encephalopahty,  VF; LCfx free of disease and dominant, RCA patent, L main short and patent, LAD with 70-80% narrowing and focal 95% stenosis in distal 3rd with balloon angioplasty (Dr. Marella Chimes)  . CORONARY ANGIOPLASTY WITH STENT PLACEMENT  02/26/1996   3.0x71mm Multi-Link stent to prox/mid LAD; L main normal & short, Cfx dominant and normal, PDA & PLA normal, RCA non-dominant with small PLA and normal (Dr. Marella Chimes)  . CORONARY ARTERY BYPASS GRAFT  07/31/1996   x2; LIMA to LAD and SVG to 1st diagonal (Dr. Loletha Grayer.  Roxy Manns)  . CORONARY ARTERY BYPASS GRAFT  07/31/1996   x1; SVG to distal LAD (Dr. Remus Loffler)  . NM MYOCAR PERF WALL MOTION  10/2010; 06/2014   a) LexiScan Cardiolite - mild perfusion defect r/t infarct/scar with mild periifarct ischemia in mid anterior & apical anterior region; EF 67%; abnormal, low risk study;; b) Small, moderate intensity Fixed perfusion defect - mid Anterior Wall c/w known Anterior MI. LOW RISK   . TRANSTHORACIC ECHOCARDIOGRAM  02/2012; 06/2014   a) EF 50-55%, mild HK of anterospetal myocardium; mild MR;; b) EF 55-60%, mild HK of apical anterior wall.   Mild MR    Family History  Problem Relation Age of Onset  . Heart failure Other   . Diabetes Other   . Diabetes Mother   . Thyroid disease Paternal Aunt   . Thyroid disease Maternal Grandmother     Social History:  reports that she has quit smoking. Her smoking use included cigarettes. She smoked 0.25 packs per day. She has quit using smokeless tobacco. She reports that she does not drink alcohol. Her drug history is not on file.  REVIEW Of SYSTEMS:  History of hyperlipidemia: Was treated with Crestor, no recent labs available, followed by PCP or cardiologist   Lab Results  Component Value Date   CHOL 140 12/06/2016   HDL 57 12/06/2016   LDLCALC 59 12/06/2016   TRIG 119 12/06/2016   CHOLHDL 2.5 12/06/2016   She continues to be on Xarelto for PAF    Examination:   BP 130/72 (BP Location: Left Arm, Patient Position:  Sitting, Cuff Size: Normal)   Pulse 65   Ht 5\' 2"  (1.575 m)   Wt 139 lb 6.4 oz (63.2 kg)   SpO2 97%   BMI 25.50 kg/m   She looks well   Thyroid exam on the right shows 2 small nodules about 1 cm firm in the medial part Also left side has about a 1-1.5 cm firm nodule in the medial part  The biceps reflexes normal Skin appears normal    Assessment   Hypothyroidism, post ablative previous treatment several years ago She has been on a consistent dose of 75 mcg levothyroxine now Even though she has weight over the last couple of years from quitting smoking her levels are good and she is subjectively doing well  Her TSH is again quite normal  Has been compliant with the dose daily in the mornings    MULTINODULAR goiter: Long-standing and previously benign Left lobe appears to be smaller in palpation smaller nodules palpable on the right side down  However more than likely this is part of her multinodular goiter that she has had for 40 years     Treatment:  Continue 75 g levothyroxine daily  Since she has nodules currently on the right side, will need to see her back in 6 months to reexamine her, do not feel an ultrasound is again necessary goiter for several years    Total visit time 15 minutes   Derron Pipkins 02/16/2017, 3:46 PM

## 2017-02-26 ENCOUNTER — Other Ambulatory Visit: Payer: Self-pay | Admitting: Endocrinology

## 2017-03-02 ENCOUNTER — Other Ambulatory Visit: Payer: Self-pay | Admitting: Endocrinology

## 2017-03-06 ENCOUNTER — Other Ambulatory Visit: Payer: Self-pay | Admitting: Cardiology

## 2017-03-06 MED ORDER — RIVAROXABAN 20 MG PO TABS: 20.0000 mg | ORAL_TABLET | Freq: Every day | ORAL | 1 refills | Status: DC

## 2017-03-06 NOTE — Telephone Encounter (Signed)
RX sent

## 2017-03-06 NOTE — Telephone Encounter (Signed)
New Message      *STAT* If patient is at the pharmacy, call can be transferred to refill team.   1. Which medications need to be refilled? (please list name of each medication and dose if known)  rivaroxaban (XARELTO) 20 MG TABS tablet Take 1 tablet (20 mg total) by mouth daily with supper.     2. Which pharmacy/location (including street and city if local pharmacy) is [facility]   3. Do they need a 30 day or 90 day supply?  Needs prescription for 90 day supply

## 2017-04-09 ENCOUNTER — Other Ambulatory Visit: Payer: Self-pay | Admitting: Cardiology

## 2017-04-14 ENCOUNTER — Telehealth: Payer: Self-pay | Admitting: Cardiology

## 2017-04-14 MED ORDER — METOPROLOL TARTRATE 25 MG PO TABS: 37.5000 mg | ORAL_TABLET | Freq: Two times a day (BID) | ORAL | 2 refills | Status: DC

## 2017-04-14 NOTE — Telephone Encounter (Signed)
Follow up    Patient is out of medication needs it called in asap

## 2017-04-14 NOTE — Telephone Encounter (Signed)
New Message    *STAT* If patient is at the pharmacy, call can be transferred to refill team.   1. Which medications need to be refilled? (please list name of each medication and dose if known) metoprolol tartrate (LOPRESSOR) 25 MG tablet   2. Which pharmacy/location (including street and city if local pharmacy) is medication to be sent to? [facility]   3. Do they need a 30 day or 90 day supply? 30 day

## 2017-04-14 NOTE — Telephone Encounter (Signed)
Rx sent to pharmacy. Patient aware. 

## 2017-05-01 ENCOUNTER — Ambulatory Visit (INDEPENDENT_AMBULATORY_CARE_PROVIDER_SITE_OTHER): Payer: Medicare Other | Admitting: Cardiology

## 2017-05-01 ENCOUNTER — Encounter: Payer: Self-pay | Admitting: Cardiology

## 2017-05-01 VITALS — BP 140/80 | HR 67 | Ht 62.0 in | Wt 139.0 lb

## 2017-05-01 DIAGNOSIS — E034 Atrophy of thyroid (acquired): Secondary | ICD-10-CM

## 2017-05-01 DIAGNOSIS — I480 Paroxysmal atrial fibrillation: Secondary | ICD-10-CM | POA: Diagnosis not present

## 2017-05-01 DIAGNOSIS — I2510 Atherosclerotic heart disease of native coronary artery without angina pectoris: Secondary | ICD-10-CM

## 2017-05-01 DIAGNOSIS — E785 Hyperlipidemia, unspecified: Secondary | ICD-10-CM | POA: Diagnosis not present

## 2017-05-01 DIAGNOSIS — Z720 Tobacco use: Secondary | ICD-10-CM | POA: Diagnosis not present

## 2017-05-01 DIAGNOSIS — I2102 ST elevation (STEMI) myocardial infarction involving left anterior descending coronary artery: Secondary | ICD-10-CM

## 2017-05-01 MED ORDER — METOPROLOL TARTRATE 25 MG PO TABS: 37.5000 mg | ORAL_TABLET | Freq: Two times a day (BID) | ORAL | 3 refills | Status: DC

## 2017-05-01 MED ORDER — ROSUVASTATIN CALCIUM 20 MG PO TABS: 20.0000 mg | ORAL_TABLET | Freq: Every day | ORAL | 3 refills | Status: DC

## 2017-05-01 MED ORDER — RIVAROXABAN 20 MG PO TABS: 20.0000 mg | ORAL_TABLET | Freq: Every day | ORAL | 3 refills | Status: DC

## 2017-05-01 NOTE — Patient Instructions (Addendum)
NO MEDICATION CHANGES    Your physician recommends that you schedule a follow-up appointment in [place].    Your physician wants you to follow-up in [place].You will receive a reminder letter in the mail two months in advance. If you don't receive a letter, please call our office to schedule the follow-up appointment.    If you need a refill on your cardiac medications before your next appointment, please call your pharmacy.

## 2017-05-01 NOTE — Progress Notes (Signed)
PCP: System, Pcp Not In  Clinic Note: Chief Complaint  Patient presents with  . Follow-up    [facility], pt complains of SOB, fatigue, pt denies chest pains and swelling in hands/feet  . Coronary Artery Disease  . Atrial Fibrillation    Also has short spells of tachyarrhythmia    HPI: Dominique Barber is a 62 y.o. female with a PMH below who presents today for 6 month f/u of CAD-CABG, PAF.  He last came in to discuss results of Myoview & Echo ordered @ last visit to evaluate for possible ischemic etiology for [name].She had been a heavy smoker, she quit initially and then return to smoking, no distress Past Cardiac History:  Anterior STEMI 43 (age 67): She had EF arrest with cardiac shock and mild anoxic brain injury (with status epilepticus). --> Emergent PTCA to the LAD  She then had BMS stent placed in February 1998  Shortly thereafter she underwent CABG with the LIMA-LAD and SVG-D2. - For in-stent restenosis  Postop complication -- acute occlusion of the LIMA graft -> recurrent anterior MI -> redo CABG with SVG-mLAD.   Most recent Cath Jan 2005: Patent SVG-D1 & SVG-mLAD- (no significant change in disease). Large bifurcating D1 branch that is filled via retrograde flow from the LAD. Dominant circumflex with small OM1 large bifurcating OM 2 as well as a large OM 3 before terminating in a bifurcation into LPL and large PDA. The LIMA LAD was atretic/occluded.  -> EF roughly 50% with inferoapical hypokinesis.  Myoview June 2016 - Small mid anterior wall scar without ischemia. Low risk study  By her report, since her CABG she has been troubled with IBS  Afib - April 2016 - now on Xarelto.   Dominique Barber was last seen in Sept & Oct for atypical CP - seen by Almyra Deforest, PA --> changed Bra & got better.  Gained wgt with trying to quit smoking => finally Quit in Feb =-> now on Nicorette.   Recent Hospitalizations:None  Studies Reviewed:  No  new studies besides labs  - Based on her most recent lipids: we tried to start 40 mg atorvastatin (she previously took Lipitor without issues). -- was changed to rosuvastatin 20 mg. Lab Results  Component Value Date   CHOL 140 12/06/2016   HDL 57 12/06/2016   LDLCALC 59 12/06/2016   TRIG 119 12/06/2016   CHOLHDL 2.5 12/06/2016    Interval History: Overall, Dominique Barber seems to be doing fairly well from a cardiac standpoint.  She does not have any resting or exertional chest tightness/pressure or dyspnea..  She is quite active.  She says her thyroid is been a little bit low and has been adjusted by her PCP.  She has spells that she says last maybe 3 to 5 minutes off and on of "[name] ".,  but interestingly, these symptoms actually usually break with cough.  She never gets dizzy or lightheaded with them they do not last long enough to make her short of breath.  She has no syncope or near syncope.  No TIA or amaurosis fugax.  She has not had any prolonged rapid irregular heartbeat spells.  She has no PND, orthopnea or edema No melena, hematochezia, hematuria epistaxis or significant bruising on, Xarelto.  She seems to be tolerating Crestor having switched from atorvastatin to trip Crestor.   she is doing pretty well from a cardiac standpoint. No routine chest tightness or pressure with  rest or exertion. No real exertional dyspnea unless she overdoes it. Spends lots of time cleaning house / cares for dogs, etc.   Few gas related epigastric or lateral CP.  Feels some infrequent heart "skipping", fluttering or fast beats - usually @ night & only last a few seconds - usually occurring @ night when she lies down -- nothing to suggest recurrence of Afib (or at least Afib -RVR).   She tried to take atorvastatin, but noted that she had horrible gas/bloating pain (worse IBS) that resolved within a day of stopping the medicine -- she simply decided to stop it without telling us.  No  claudication.  Dominique Barber  Past Medical History:  Diagnosis Date  . CAD in native artery 1998   Followup 1 month post MI revealed 95% ISR in PTCA site --> PCI with 3.0 mm x 25 mm BMS stent in proximal LAD --> 5 months later recurrent ISR of LAD BMS that compromised D1 --> referred for CABG .--> Emergent redo single vessel CABG with SVG-mLAD for occluded LIMA  . COPD (chronic obstructive pulmonary disease) ([place])   . GERD (gastroesophageal reflux disease)   . Hyperlipidemia   . Hypothyroidism    On Synthroid  . Iron deficiency anemia   . Irritable bowel syndrome    Followed by Dr. Juanita Craver.  Marland Kitchen PAF (paroxysmal atrial fibrillation) ([place]) 05/03/2014   New onset - originally with RVR  . S/P CABG x 2 07/1996   Initially LIMA-LAD, SVG-D1 --> immediate LIMA failure post CABG with anterior MI --> urgent redo SVG-LAD beyond D2.; Widely patent grafts as of 2005 with left dominant system. Graft to the diagonal branch has a very small target vessel but the vein graft to LAD retrograde fills a large bifurcating D2 and antegrade fills a large D3.  . ST elevation (STEMI) myocardial infarction involving left anterior descending coronary artery ([place]) 12/87/8676   Complicated by VF arrest, and anoxic brain injury with status epilepticus; POBA of LAD --> 6 months later, after 2 vessel CABG she had postop complication with occluded LIMA/second anterior STEMI  . Tobacco abuse     Past Surgical History:  Procedure Laterality Date  . APPENDECTOMY    . CARDIAC CATHETERIZATION  07/30/1996   normal L main, LAD w/95% stenosis just before stent, diffuse 80-85% end-stent restenosis, 1st diagonal with70-75% ostial stenosis, large optional diagonal that was normal, Cfx with 2 marginals and distal PLA (all normal), RCA normal (Dr. Marella Chimes)  . CARDIAC CATHETERIZATION  01/31/2003   LAD totally occluded in prox 3rd, patent Cfx, SVG to mid LAd patent, SVG to small DX1 patent, LIMA to LAD was atretic and essentially occluded  in junction of prox & mid 3rd, R barciocephalic and subclavian were normal (Dr. Marella Chimes)  . CESAREAN SECTION    . CHOLECYSTECTOMY    . CORONARY ANGIOPLASTY  01/19/1996   acute anterior wall MI, anoxic encephalopahty, VF; LCfx free of disease and dominant, RCA patent, L main short and patent, LAD with 70-80% narrowing and focal 95% stenosis in distal 3rd with balloon angioplasty (Dr. Marella Chimes)  . CORONARY ANGIOPLASTY WITH STENT PLACEMENT  02/26/1996   3.0x15mm Multi-Link stent to prox/mid LAD; L main normal & short, Cfx dominant and normal, PDA & PLA normal, RCA non-dominant with small PLA and normal (Dr. Marella Chimes)  . CORONARY ARTERY BYPASS GRAFT  07/31/1996   x2; LIMA to LAD and SVG to 1st diagonal (Dr. Remus Loffler)  . CORONARY ARTERY BYPASS GRAFT  07/31/1996   x1; SVG to distal LAD (Dr. Remus Loffler)  . NM MYOCAR PERF WALL MOTION  10/2010; 06/2014   a) LexiScan Cardiolite - mild perfusion defect r/t infarct/scar with mild periifarct ischemia in mid anterior & apical anterior region; EF 67%; abnormal, low risk study;; b) Small, moderate intensity Fixed perfusion defect - mid Anterior Wall c/w known Anterior MI. LOW RISK   . TRANSTHORACIC ECHOCARDIOGRAM  02/2012; 06/2014   a) EF 50-55%, mild HK of anterospetal myocardium; mild MR;; b) EF 55-60%, mild HK of apical anterior wall.   Mild MR   Current Meds  Medication Sig  . albuterol (PROVENTIL HFA;VENTOLIN HFA) 108 (90 BASE) MCG/ACT inhaler Inhale 1-2 puffs into the lungs every 6 (six) hours as needed for wheezing or shortness of breath.  Marland Kitchen CALCIUM PO Take 1 tablet by mouth daily.   . cetirizine (ZYRTEC) 10 MG tablet Take 1 tablet (10 mg total) by mouth daily.  . cholestyramine (QUESTRAN) 4 g packet Take 1 packet by mouth daily.  . GuaiFENesin (TUSSIN PO) Take 30 mLs by mouth every 4 (four) hours as needed. For cough  . hyoscyamine (LEVSIN, ANASPAZ) 0.125 MG tablet Take 0.125 mg by mouth 2 (two) times daily.   Marland Kitchen KLOR-CON 10 10 MEQ tablet TAKE AS  INSTRUCTED BY YOUR PRESCRIBER  . metoprolol tartrate (LOPRESSOR) 25 MG tablet Take 1.5 tablets (37.5 mg total) by mouth 2 (two) times daily.  . nicotine (NICODERM CQ - DOSED IN MG/24 HR) 7 mg/24hr patch Place 1 patch (7 mg total) onto the skin daily. (Patient taking differently: Place 7 mg onto the skin daily. Patient states that she is actually chewing the 7 mg gum instead of using the patch)  . Probiotic Product (ALIGN PO) Take by mouth daily.  . rivaroxaban (XARELTO) 20 MG TABS tablet Take 1 tablet (20 mg total) by mouth daily with supper.  Marland Kitchen SYNTHROID 50 MCG tablet TAKE ONE AND ONE-HALF (1 & 1/2) TABLETS DAILY  . [DISCONTINUED] metoprolol tartrate (LOPRESSOR) 25 MG tablet Take 1.5 tablets (37.5 mg total) by mouth 2 (two) times daily.  . [DISCONTINUED] rivaroxaban (XARELTO) 20 MG TABS tablet Take 1 tablet (20 mg total) by mouth daily with supper.    Current Outpatient Medications on File Prior to Visit  Medication Sig Dispense Refill  . albuterol (PROVENTIL HFA;VENTOLIN HFA) 108 (90 BASE) MCG/ACT inhaler Inhale 1-2 puffs into the lungs every 6 (six) hours as needed for wheezing or shortness of breath. 1 Inhaler 0  . CALCIUM PO Take 1 tablet by mouth daily.     . cetirizine (ZYRTEC) 10 MG tablet Take 1 tablet (10 mg total) by mouth daily. 90 tablet 1  . cholestyramine (QUESTRAN) 4 g packet Take 1 packet by mouth daily.    . GuaiFENesin (TUSSIN PO) Take 30 mLs by mouth every 4 (four) hours as needed. For cough    . hyoscyamine (LEVSIN, ANASPAZ) 0.125 MG tablet Take 0.125 mg by mouth 2 (two) times daily.     Marland Kitchen KLOR-CON 10 10 MEQ tablet TAKE AS INSTRUCTED BY YOUR PRESCRIBER 180 tablet 36  . nicotine (NICODERM CQ - DOSED IN MG/24 HR) 7 mg/24hr patch Place 1 patch (7 mg total) onto the skin daily. (Patient taking differently: Place 7 mg onto the skin daily. Patient states that she is actually chewing the 7 mg gum instead of using the patch) 28 patch 2  . Probiotic Product (ALIGN PO) Take by mouth  daily.    Marland Kitchen SYNTHROID 50 MCG  tablet TAKE ONE AND ONE-HALF (1 & 1/2) TABLETS DAILY 135 tablet 1  . nitroGLYCERIN (NITROSTAT) 0.4 MG SL tablet Place 1 tablet (0.4 mg total) under the tongue every 5 (five) minutes as needed for chest pain. 25 tablet 1   No current facility-administered medications on file prior to visit.      Allergies  Allergen Reactions  . Atorvastatin Other (See Comments)    Gas pain in abdomen with no relief  . Iohexol Hives and Rash    Red dye  . Viberzi [Eluxadoline] Nausea And Vomiting   Social History   Tobacco Use  . Smoking status: Former Smoker    Packs/day: 0.25    Types: Cigarettes  . Smokeless tobacco: Former Systems developer  . Tobacco comment: Quit at time of MI, restarted in 2009  Substance Use Topics  . Alcohol use: No    Alcohol/week: 0.0 oz  . Drug use: Not on file   Family History  Problem Relation Age of Onset  . Heart failure Other   . Diabetes Other   . Diabetes Mother   . Thyroid disease Paternal Aunt   . Thyroid disease Maternal Grandmother     ROS: A comprehensive was performed. Review of Systems  Constitutional: Negative for malaise/fatigue and weight loss (Gained weight after smoking cessation).  HENT: Negative for nosebleeds.        - now has dentures  Respiratory: Negative for cough (Some from smoking) and wheezing.   Cardiovascular: Negative for claudication and leg swelling (pretty good - but notes it in the summer heat more than now ).       Per history of present illness  Gastrointestinal: Positive for abdominal pain (Occasional with IBS), constipation and diarrhea (Occasional with IBS). Negative for blood in stool.       IBS symptoms; overall seem to be more stable.  Genitourinary: Negative for hematuria.  Musculoskeletal: Positive for joint pain (right shoulder pain).  Neurological: Positive for dizziness (No further dizzy episodes ). Negative for focal weakness and headaches.  Endo/Heme/Allergies: Positive for environmental  allergies. Does not bruise/bleed easily.       Getting a little bit more energy since her Synthroid dose has been titrated.  Psychiatric/Behavioral: Negative for depression. The patient is nervous/anxious.   All other systems reviewed and are negative.   Wt Readings from Last 3 Encounters:  05/01/17 139 lb (63 kg)  02/16/17 139 lb 6.4 oz (63.2 kg)  10/31/16 135 lb (61.2 kg)    PHYSICAL EXAM BP 140/80 (BP Location: Left Arm)   Pulse 67   Ht 5\' 2"  (1.575 m)   Wt 139 lb (63 kg)   BMI 25.42 kg/m   Physical Exam  Constitutional: She is oriented to person, place, and time. She appears well-developed and well-nourished. No distress.  HENT:  Head: Normocephalic and atraumatic.  Eyes: Pupils are equal, round, and reactive to light. EOM are normal.  Neck: Normal range of motion. Neck supple. No hepatojugular reflux and no JVD present. Carotid bruit is not present.  Cardiovascular: Normal rate, regular rhythm and intact distal pulses. Exam reveals no gallop and no friction rub.  Murmur heard.  Harsh crescendo-decrescendo early systolic murmur is present with a grade of 1/6 at the upper right sternal border. Pulmonary/Chest: Effort normal and breath sounds normal. No respiratory distress. She has no wheezes. She has no rales.  Abdominal: Soft. Bowel sounds are normal. She exhibits no distension. There is no tenderness.  Musculoskeletal: Normal range of motion. She exhibits  no edema.  Neurological: She is alert and oriented to person, place, and time.  Psychiatric: She has a normal mood and affect. Her behavior is normal. Judgment and thought content normal.  Nursing note and vitals reviewed.   Adult ECG Report  Not checked  Additional studies/ records that were reviewed today include:  Recent Labs:  Has not had recent labs checked. Lab Results  Component Value Date   CHOL 140 12/06/2016   HDL 57 12/06/2016   LDLCALC 59 12/06/2016   TRIG 119 12/06/2016   CHOLHDL 2.5 12/06/2016     ASSESSMENT / PLAN: Problem List Items Addressed This Visit    Tobacco abuse (Chronic)    She seems to be doing fairly well with smoking cessation.  She is just now started using Nicorette gum and is hoping to be a make-up for the next few months.  I congratulated her efforts. Smoking cessation instruction/counseling given:  commended patient for quitting and reviewed strategies for preventing relapses      ST elevation (STEMI) myocardial infarction involving left anterior descending coronary artery (HCC) (Chronic)    Known history of anterior MI with moderate diffusion defect noted on Myoview.  Thankfully, no peri-infarct ischemia on last evaluation.  No angina or heart failure symptoms.  She is on a stable dose of beta-blocker and rosuvastatin.  Not on aspirin Plavix because she is on Xarelto for [name]. With relatively preserved EF we have not titrated up antihypertensive agents to add an ARB or ACE-I.  (Usually she has not had high enough blood pressures). -->  If pressures were to be higher I would consider ACE inhibitor or ARB.      Relevant Medications   rosuvastatin (CRESTOR) 20 MG tablet   rivaroxaban (XARELTO) 20 MG TABS tablet   metoprolol tartrate (LOPRESSOR) 25 MG tablet   Paroxysmal atrial fibrillation (HCC); CHA2DS2-VASc Score 3. On Xarelto (Chronic)    She does not sound like she has had a prolonged episode that may have been [name] in quite some time now.  She has had several of these short-lived episodes which may very well be [name], but could also just simply be SVT or or some type of PAT. She remains anticoagulated on Xarelto without any bleeding issues and has beta-blocker for rate control.  Since the episodes that she does feel quite fleeting, I would not adjust her medications      Relevant Medications   rosuvastatin (CRESTOR) 20 MG tablet   rivaroxaban (XARELTO) 20 MG TABS tablet   metoprolol tartrate (LOPRESSOR) 25 MG tablet   Hypothyroid    TSH from  February look pretty good.  Apparently this is being adjusted by her PCP.      Relevant Medications   metoprolol tartrate (LOPRESSOR) 25 MG tablet   Hyperlipidemia with target LDL less than 70 (Chronic)    She really did not do well with atorvastatin, but seems to be tolerating rosuvastatin quite well Lipid panel reviewed and total cholesterol 140 with an LDL of 59 is pretty much right at goal. Continue rosuvastatin..      Relevant Medications   rosuvastatin (CRESTOR) 20 MG tablet   rivaroxaban (XARELTO) 20 MG TABS tablet   metoprolol tartrate (LOPRESSOR) 25 MG tablet   CAD- 100% LAD, s/p SVG-LAD after failed LIMA-LAD - Primary (Chronic)    Distant history of MI back in 1988.  She was quite young at that time and had bypass surgery followed by redo CABG.  Interestingly, since that has  been done she really has not had any significant cardiac issues.  No recurrent angina.  May be some occasional dyspnea but no heart failure PND orthopnea or edema. As such, she is only on moderate dose beta-blocker along with rosuvastatin.  Not on antiplatelet agent because of being on Xarelto. Continue to recommend staying active and walking.       Relevant Medications   rosuvastatin (CRESTOR) 20 MG tablet   rivaroxaban (XARELTO) 20 MG TABS tablet   metoprolol tartrate (LOPRESSOR) 25 MG tablet      Current medicines are reviewed at length with the patient today. (+/- concerns) none The following changes have been made: none  Patient Instructions  NO MEDICATION CHANGES    Your physician recommends that you schedule a follow-up appointment in [place].    Your physician wants you to follow-up in [place].You will receive a reminder letter in the mail two months in advance. If you don't receive a letter, please call our office to schedule the follow-up appointment.    If you need a refill on your cardiac medications before your next appointment, please call your  pharmacy.    Studies Ordered:   No orders of the defined types were placed in this encounter.   follow-up appointment in 6 month with Dr Sanjuana Kava, M.D., M.S. Interventional Cardiologist   Pager # [phone]

## 2017-05-03 ENCOUNTER — Encounter: Payer: Self-pay | Admitting: Cardiology

## 2017-05-03 NOTE — Assessment & Plan Note (Signed)
Known history of anterior MI with moderate diffusion defect noted on Myoview.  Thankfully, no peri-infarct ischemia on last evaluation.  No angina or heart failure symptoms.  She is on a stable dose of beta-blocker and rosuvastatin.  Not on aspirin Plavix because she is on Xarelto for [name]. With relatively preserved EF we have not titrated up antihypertensive agents to add an ARB or ACE-I.  (Usually she has not had high enough blood pressures). -->  If pressures were to be higher I would consider ACE inhibitor or ARB.

## 2017-05-03 NOTE — Assessment & Plan Note (Signed)
TSH from February look pretty good.  Apparently this is being adjusted by her PCP.

## 2017-05-03 NOTE — Assessment & Plan Note (Signed)
Distant history of MI back in 1988.  She was quite young at that time and had bypass surgery followed by redo CABG.  Interestingly, since that has been done she really has not had any significant cardiac issues.  No recurrent angina.  May be some occasional dyspnea but no heart failure PND orthopnea or edema. As such, she is only on moderate dose beta-blocker along with rosuvastatin.  Not on antiplatelet agent because of being on Xarelto. Continue to recommend staying active and walking.

## 2017-05-03 NOTE — Assessment & Plan Note (Signed)
She really did not do well with atorvastatin, but seems to be tolerating rosuvastatin quite well Lipid panel reviewed and total cholesterol 140 with an LDL of 59 is pretty much right at goal. Continue rosuvastatin.Marland Kitchen

## 2017-05-03 NOTE — Assessment & Plan Note (Signed)
She seems to be doing fairly well with smoking cessation.  She is just now started using Nicorette gum and is hoping to be a make-up for the next few months.  I congratulated her efforts. Smoking cessation instruction/counseling given:  commended patient for quitting and reviewed strategies for preventing relapses

## 2017-05-03 NOTE — Assessment & Plan Note (Signed)
She does not sound like she has had a prolonged episode that may have been [name] in quite some time now.  She has had several of these short-lived episodes which may very well be [name], but could also just simply be SVT or or some type of PAT. She remains anticoagulated on Xarelto without any bleeding issues and has beta-blocker for rate control.  Since the episodes that she does feel quite fleeting, I would not adjust her medications

## 2017-05-28 ENCOUNTER — Other Ambulatory Visit: Payer: Self-pay | Admitting: Cardiology

## 2017-07-07 ENCOUNTER — Other Ambulatory Visit: Payer: Self-pay | Admitting: Cardiology

## 2017-08-09 ENCOUNTER — Telehealth: Payer: Self-pay | Admitting: Cardiology

## 2017-08-09 MED ORDER — METOPROLOL TARTRATE 25 MG PO TABS: 37.5000 mg | ORAL_TABLET | Freq: Two times a day (BID) | ORAL | 3 refills | Status: DC

## 2017-08-09 MED ORDER — METOPROLOL TARTRATE 25 MG PO TABS: 37.5000 mg | ORAL_TABLET | Freq: Two times a day (BID) | ORAL | 0 refills | Status: DC

## 2017-08-09 NOTE — Telephone Encounter (Signed)
New message    *STAT* If patient is at the pharmacy, call can be transferred to refill team.   1. Which medications need to be refilled? (please list name of each medication and dose if known) metoprolol tartrate (LOPRESSOR) 25 MG tablet  2. Which pharmacy/location (including street and city if local pharmacy) is medication to be sent to?[facility] [place], [place]  3. Do they need a 30 day or 90 day supply? 90 day supply   Per the patients husband there needs to be a rush because she has less than a week of pills left.

## 2017-08-17 ENCOUNTER — Other Ambulatory Visit (INDEPENDENT_AMBULATORY_CARE_PROVIDER_SITE_OTHER): Payer: Medicare Other

## 2017-08-17 DIAGNOSIS — E042 Nontoxic multinodular goiter: Secondary | ICD-10-CM | POA: Diagnosis not present

## 2017-08-17 LAB — TSH: TSH: 2.24 u[IU]/mL (ref 0.35–4.50)

## 2017-08-17 LAB — T4, FREE: Free T4: 1.34 ng/dL (ref 0.60–1.60)

## 2017-08-21 ENCOUNTER — Ambulatory Visit: Payer: Medicare Other | Admitting: Endocrinology

## 2017-08-29 ENCOUNTER — Other Ambulatory Visit: Payer: Self-pay | Admitting: Endocrinology

## 2017-09-05 ENCOUNTER — Other Ambulatory Visit: Payer: Self-pay | Admitting: Cardiology

## 2017-09-06 ENCOUNTER — Other Ambulatory Visit: Payer: Self-pay | Admitting: Pharmacist Clinician (PhC)/ Clinical Pharmacy Specialist

## 2017-09-06 DIAGNOSIS — I480 Paroxysmal atrial fibrillation: Secondary | ICD-10-CM

## 2017-10-04 ENCOUNTER — Other Ambulatory Visit: Payer: Self-pay | Admitting: Endocrinology

## 2017-10-04 DIAGNOSIS — E890 Postprocedural hypothyroidism: Secondary | ICD-10-CM

## 2017-10-06 ENCOUNTER — Other Ambulatory Visit (INDEPENDENT_AMBULATORY_CARE_PROVIDER_SITE_OTHER): Payer: Medicare Other

## 2017-10-06 DIAGNOSIS — E890 Postprocedural hypothyroidism: Secondary | ICD-10-CM | POA: Diagnosis not present

## 2017-10-06 LAB — TSH: TSH: 1.49 u[IU]/mL (ref 0.35–4.50)

## 2017-10-06 LAB — T4, FREE: Free T4: 1.21 ng/dL (ref 0.60–1.60)

## 2017-10-10 ENCOUNTER — Encounter: Payer: Self-pay | Admitting: Endocrinology

## 2017-10-10 ENCOUNTER — Ambulatory Visit: Payer: Medicare Other | Admitting: Endocrinology

## 2017-10-10 ENCOUNTER — Ambulatory Visit (INDEPENDENT_AMBULATORY_CARE_PROVIDER_SITE_OTHER): Payer: Medicare Other | Admitting: Endocrinology

## 2017-10-10 VITALS — BP 136/78 | HR 60 | Ht 62.0 in | Wt 137.0 lb

## 2017-10-10 DIAGNOSIS — Z23 Encounter for immunization: Secondary | ICD-10-CM

## 2017-10-10 DIAGNOSIS — I2510 Atherosclerotic heart disease of native coronary artery without angina pectoris: Secondary | ICD-10-CM | POA: Diagnosis not present

## 2017-10-10 DIAGNOSIS — E042 Nontoxic multinodular goiter: Secondary | ICD-10-CM | POA: Diagnosis not present

## 2017-10-10 NOTE — Progress Notes (Signed)
Patient ID: Dominique Barber, female   DOB: Mar 11, 1955, 62 y.o.   MRN: [id number]   Reason for Appointment:  Hypothyroidism and goiter followup visit    History of Present Illness:   The hypothyroidism was first diagnosed in 1999 after radioactive iodine treatment for toxic nodular goiter Subsequently she became hypothyroid and has been on thyroid supplementation  She has been on the same dose about 11/16 when her TSH was lower than usual at 0.13 Her dose was reduced from 100 down to 75 g     Subsequently TSH has been normal consistently  She does feel fairly good without any unusual fatigue or weight change            The patient is taking the thyroid supplement daily in the morning before breakfast including recently  Not taking any calcium or iron -containing supplements at the same time in the morning   Lab Results  Component Value Date   TSH 1.49 10/06/2017   TSH 2.24 08/17/2017   TSH 1.68 02/13/2017   FREET4 1.21 10/06/2017   FREET4 1.34 08/17/2017   FREET4 1.17 02/13/2017   Wt Readings from Last 3 Encounters:  10/10/17 137 lb (62.1 kg)  05/01/17 139 lb (63 kg)  02/16/17 139 lb 6.4 oz (63.2 kg)     GOITER:   She has had a multinodular goiter since her 88s. Details of this are also not available at present. She did report that she has had a biopsy in the past   She is here for follow-up because of the finding of new small nodules on the right lobe Not palpable previously She does not feel any choking or local pressure   Allergies as of 10/10/2017      Reactions   Atorvastatin Other (See Comments)   Gas pain in abdomen with no relief   Iohexol Hives, Rash   Red dye   Viberzi [eluxadoline] Nausea And Vomiting      Medication List        Accurate as of 10/10/17  2:13 PM. Always use your most recent med list.          albuterol 108 (90 Base) MCG/ACT inhaler Commonly known as:  PROVENTIL HFA;VENTOLIN HFA Inhale 1-2 puffs into the lungs  every 6 (six) hours as needed for wheezing or shortness of breath.   ALIGN PO Take by mouth daily.   CALCIUM PO Take 1 tablet by mouth daily.   cetirizine 10 MG tablet Commonly known as:  ZYRTEC Take 1 tablet (10 mg total) by mouth daily.   cholestyramine 4 g packet Commonly known as:  QUESTRAN Take 1 packet by mouth daily.   hyoscyamine 0.125 MG tablet Commonly known as:  LEVSIN, ANASPAZ Take 0.125 mg by mouth 2 (two) times daily.   KLOR-CON 10 10 MEQ tablet Generic drug:  potassium chloride TAKE AS INSTRUCTED BY YOUR PRESCRIBER   metoprolol tartrate 25 MG tablet Commonly known as:  LOPRESSOR Take 1.5 tablets (37.5 mg total) by mouth 2 (two) times daily.   nicotine 7 mg/24hr patch Commonly known as:  NICODERM CQ - dosed in mg/24 hr Place 1 patch (7 mg total) onto the skin daily.   nitroGLYCERIN 0.4 MG SL tablet Commonly known as:  NITROSTAT Place 1 tablet (0.4 mg total) under the tongue every 5 (five) minutes as needed for chest pain.   rivaroxaban 20 MG Tabs tablet Commonly known as:  XARELTO Take 1 tablet (20 mg total) by mouth daily with  supper.   XARELTO 20 MG Tabs tablet Generic drug:  rivaroxaban TAKE 1 TABLET DAILY WITH SUPPER   rosuvastatin 20 MG tablet Commonly known as:  CRESTOR Take 1 tablet (20 mg total) by mouth daily.   SYNTHROID 50 MCG tablet Generic drug:  levothyroxine TAKE ONE AND ONE-HALF (1 & 1/2) TABLETS DAILY   TUSSIN PO Take 30 mLs by mouth every 4 (four) hours as needed. For cough       Allergies:  Allergies  Allergen Reactions  . Atorvastatin Other (See Comments)    Gas pain in abdomen with no relief  . Iohexol Hives and Rash    Red dye  . Viberzi [Eluxadoline] Nausea And Vomiting    Past Medical History:  Diagnosis Date  . CAD in native artery 1998   Followup 1 month post MI revealed 95% ISR in PTCA site --> PCI with 3.0 mm x 25 mm BMS stent in proximal LAD --> 5 months later recurrent ISR of LAD BMS that compromised D1  --> referred for CABG .--> Emergent redo single vessel CABG with SVG-mLAD for occluded LIMA  . COPD (chronic obstructive pulmonary disease) ([place])   . GERD (gastroesophageal reflux disease)   . Hyperlipidemia   . Hypothyroidism    On Synthroid  . Iron deficiency anemia   . Irritable bowel syndrome    Followed by Dr. Juanita Craver.  Marland Kitchen PAF (paroxysmal atrial fibrillation) ([place]) 05/03/2014   New onset - originally with RVR  . S/P CABG x 2 07/1996   Initially LIMA-LAD, SVG-D1 --> immediate LIMA failure post CABG with anterior MI --> urgent redo SVG-LAD beyond D2.; Widely patent grafts as of 2005 with left dominant system. Graft to the diagonal branch has a very small target vessel but the vein graft to LAD retrograde fills a large bifurcating D2 and antegrade fills a large D3.  . ST elevation (STEMI) myocardial infarction involving left anterior descending coronary artery ([place]) 96/04/5407   Complicated by VF arrest, and anoxic brain injury with status epilepticus; POBA of LAD --> 6 months later, after 2 vessel CABG she had postop complication with occluded LIMA/second anterior STEMI  . Tobacco abuse     Past Surgical History:  Procedure Laterality Date  . APPENDECTOMY    . CARDIAC CATHETERIZATION  07/30/1996   normal L main, LAD w/95% stenosis just before stent, diffuse 80-85% end-stent restenosis, 1st diagonal with70-75% ostial stenosis, large optional diagonal that was normal, Cfx with 2 marginals and distal PLA (all normal), RCA normal (Dr. Marella Chimes)  . CARDIAC CATHETERIZATION  01/31/2003   LAD totally occluded in prox 3rd, patent Cfx, SVG to mid LAd patent, SVG to small DX1 patent, LIMA to LAD was atretic and essentially occluded in junction of prox & mid 3rd, R barciocephalic and subclavian were normal (Dr. Marella Chimes)  . CESAREAN SECTION    . CHOLECYSTECTOMY    . CORONARY ANGIOPLASTY  01/19/1996   acute anterior wall MI, anoxic encephalopahty, VF; LCfx free of disease and dominant, RCA  patent, L main short and patent, LAD with 70-80% narrowing and focal 95% stenosis in distal 3rd with balloon angioplasty (Dr. Marella Chimes)  . CORONARY ANGIOPLASTY WITH STENT PLACEMENT  02/26/1996   3.0x35mm Multi-Link stent to prox/mid LAD; L main normal & short, Cfx dominant and normal, PDA & PLA normal, RCA non-dominant with small PLA and normal (Dr. Marella Chimes)  . CORONARY ARTERY BYPASS GRAFT  07/31/1996   x2; LIMA to LAD and SVG to 1st diagonal (Dr.  C. Roxy Manns)  . CORONARY ARTERY BYPASS GRAFT  07/31/1996   x1; SVG to distal LAD (Dr. Remus Loffler)  . NM MYOCAR PERF WALL MOTION  10/2010; 06/2014   a) LexiScan Cardiolite - mild perfusion defect r/t infarct/scar with mild periifarct ischemia in mid anterior & apical anterior region; EF 67%; abnormal, low risk study;; b) Small, moderate intensity Fixed perfusion defect - mid Anterior Wall c/w known Anterior MI. LOW RISK   . TRANSTHORACIC ECHOCARDIOGRAM  02/2012; 06/2014   a) EF 50-55%, mild HK of anterospetal myocardium; mild MR;; b) EF 55-60%, mild HK of apical anterior wall.   Mild MR    Family History  Problem Relation Age of Onset  . Heart failure Other   . Diabetes Other   . Diabetes Mother   . Thyroid disease Paternal Aunt   . Thyroid disease Maternal Grandmother     Social History:  reports that she has quit smoking. Her smoking use included cigarettes. She smoked 0.25 packs per day. She has quit using smokeless tobacco. She reports that she does not drink alcohol. Her drug history is not on file.  REVIEW Of SYSTEMS:  History of hyperlipidemia: Currently does not have a PCP, followed by cardiologist described Crestor   Lab Results  Component Value Date   CHOL 140 12/06/2016   HDL 57 12/06/2016   LDLCALC 59 12/06/2016   TRIG 119 12/06/2016   CHOLHDL 2.5 12/06/2016   She continues to be on Xarelto for PAF    Examination:   BP 136/78   Pulse 60   Ht 5\' 2"  (1.575 m)   Wt 137 lb (62.1 kg)   SpO2 98%   BMI 25.06 kg/m   Minimal  swelling of the eyes  Thyroid exam: Right side has only minimally enlarged nodule 1 cm or less in the medial part on swallowing On the left lobe she has a 1-1.5 cm smooth nodule felt on swallowing also medially No lymphadenopathy in the neck No peripheral edema    Assessment    MULTINODULAR goiter: Long-standing  The new nodules on the right side is small and not as prominent, likely part of her long-standing benign goiter Left lobe enlargement is about the same, the nodule is somewhat difficult to palpate  We will have her come back annually now for follow-up  Hypothyroidism, post ablative secondary to previous I-131 treatment several years ago She has been on a consistent dose of 75 mcg levothyroxine TSH normal     Treatment:  Continue 75 g levothyroxine daily Since she is not allergic to the Synthroid tablet and only to red dye she can take the 75 mcg tablet for more convenience from now on instead of taking 1-1/2 of the 50 mcg She will call when she needs a new prescription  Reminded her to take this consistently before breakfast with water  Follow-up annually with repeat labs  Flu vaccine given   Elayne Snare 10/10/2017, 2:13 PM

## 2017-10-10 NOTE — Patient Instructions (Signed)
Call for next rx to be [phone]

## 2017-10-17 ENCOUNTER — Other Ambulatory Visit: Payer: Self-pay | Admitting: *Deleted

## 2017-10-17 MED ORDER — RIVAROXABAN 20 MG PO TABS: ORAL_TABLET | ORAL | 0 refills | Status: DC

## 2017-10-25 ENCOUNTER — Ambulatory Visit (INDEPENDENT_AMBULATORY_CARE_PROVIDER_SITE_OTHER): Payer: Medicare Other | Admitting: Cardiology

## 2017-10-25 ENCOUNTER — Encounter: Payer: Self-pay | Admitting: Cardiology

## 2017-10-25 VITALS — BP 124/66 | HR 82 | Ht 62.0 in | Wt 138.0 lb

## 2017-10-25 DIAGNOSIS — Z720 Tobacco use: Secondary | ICD-10-CM | POA: Diagnosis not present

## 2017-10-25 DIAGNOSIS — I480 Paroxysmal atrial fibrillation: Secondary | ICD-10-CM

## 2017-10-25 DIAGNOSIS — I2510 Atherosclerotic heart disease of native coronary artery without angina pectoris: Secondary | ICD-10-CM

## 2017-10-25 DIAGNOSIS — E785 Hyperlipidemia, unspecified: Secondary | ICD-10-CM

## 2017-10-25 NOTE — Progress Notes (Signed)
PCP: System, Pcp Not In  Clinic Note: Chief Complaint  Patient presents with  . Follow-up    Doing well  . Coronary Artery Disease    No angina  . Atrial Fibrillation    No recurrent episodes.  No bleeding on Xarelto    HPI: Dominique Barber is a 62 y.o. female with a PMH of CAD/CABG & PAF noted below who presents today for routine 6 month f/u.  She had been a heavy smoker, she quit initially and then return to smoking,---> Gained wgt with trying to quit smoking => finally Quit in Feb =-> now on Nicorette.   Past Cardiac History:  Anterior STEMI 29 (age 78): She had EF arrest with cardiac shock and mild anoxic brain injury (with status epilepticus). --> Emergent PTCA to the LAD  She then had BMS stent placed in February 1998  Shortly thereafter she underwent CABG with the LIMA-LAD and SVG-D2. - For in-stent restenosis  Postop complication -- acute occlusion of the LIMA graft -> recurrent anterior MI -> redo CABG with SVG-mLAD.   Most recent Cath Jan 2005: Patent SVG-D1 & SVG-mLAD- (no significant change in disease). Large bifurcating D1 branch that is filled via retrograde flow from the LAD. Dominant circumflex with small OM1 large bifurcating OM 2 as well as a large OM 3 before terminating in a bifurcation into LPL and large PDA. The LIMA LAD was atretic/occluded.  -> EF roughly 50% with inferoapical hypokinesis.  Myoview June 2016 - Small mid anterior wall scar without ischemia. Low risk study  By her report, since her CABG she has been troubled with IBS  Afib - April 2016 - now on Xarelto.   Dominique Barber was last seen in April 2019 - doing well form CV standpoint.  Staying active.  No CP/SOB. Thyroid level adjusted by PCP. ~ short 3-5 min spells of irregular rhythm - ? Afib.   Recent Hospitalizations:None  Studies Reviewed:  No new studies besides labs  - Based on her most recent lipids: we tried to start 40 mg atorvastatin (she previously took Lipitor  without issues). -- was then changed to rosuvastatin 20 mg.   Interval History: Overall, Dominique Barber returns today again doing well from a cardiac standpoint.  With the exception of intermittent "flip-flopping" sensations in her chest off & on, she denies any sensation of rapid irregular beats/rhythms to suggest recurrent Afib.  She has not had any resting or exertional chest pain since last visit. No PND, orthopnea or edema. She will get a bit SOB or dizzy with significant exertion, but not with routine activity.  Thyroid medications again adjusted by PCP.  No syncope or near syncope, or amaurosis fugax symptoms. No claudication.   No bleeding issues with Xarelto.  Tolerating rosuvastatin.   No claudication.  Complete Review of Systems Performed - Negative except symptoms noted in HPI.  No melena, hematochezia, hematochezia, or epistaxis.   Past Medical History:  Diagnosis Date  . CAD in native artery 1998   Followup 1 month post MI revealed 95% ISR in PTCA site --> PCI with 3.0 mm x 25 mm BMS stent in proximal LAD --> 5 months later recurrent ISR of LAD BMS that compromised D1 --> referred for CABG .--> Emergent redo single vessel CABG with SVG-mLAD for occluded LIMA  . COPD (chronic obstructive pulmonary disease) ([place])   . GERD (gastroesophageal reflux disease)   . Hyperlipidemia   . Hypothyroidism    On Synthroid  . Iron deficiency  anemia   . Irritable bowel syndrome    Followed by Dr. Juanita Craver.  Marland Kitchen PAF (paroxysmal atrial fibrillation) ([place]) 05/03/2014   New onset - originally with RVR  . S/P CABG x 2 07/1996   Initially LIMA-LAD, SVG-D1 --> immediate LIMA failure post CABG with anterior MI --> urgent redo SVG-LAD beyond D2.; Widely patent grafts as of 2005 with left dominant system. Graft to the diagonal branch has a very small target vessel but the vein graft to LAD retrograde fills a large bifurcating D2 and antegrade fills a large D3.  . ST elevation (STEMI) myocardial infarction  involving left anterior descending coronary artery ([place]) 43/15/4008   Complicated by VF arrest, and anoxic brain injury with status epilepticus; POBA of LAD --> 6 months later, after 2 vessel CABG she had postop complication with occluded LIMA/second anterior STEMI  . Tobacco abuse     Past Surgical History:  Procedure Laterality Date  . APPENDECTOMY    . CARDIAC CATHETERIZATION  07/30/1996   normal L main, LAD w/95% stenosis just before stent, diffuse 80-85% end-stent restenosis, 1st diagonal with70-75% ostial stenosis, large optional diagonal that was normal, Cfx with 2 marginals and distal PLA (all normal), RCA normal (Dr. Marella Chimes)  . CARDIAC CATHETERIZATION  01/31/2003   LAD totally occluded in prox 3rd, patent Cfx, SVG to mid LAd patent, SVG to small DX1 patent, LIMA to LAD was atretic and essentially occluded in junction of prox & mid 3rd, R barciocephalic and subclavian were normal (Dr. Marella Chimes)  . CESAREAN SECTION    . CHOLECYSTECTOMY    . CORONARY ANGIOPLASTY  01/19/1996   acute anterior wall MI, anoxic encephalopahty, VF; LCfx free of disease and dominant, RCA patent, L main short and patent, LAD with 70-80% narrowing and focal 95% stenosis in distal 3rd with balloon angioplasty (Dr. Marella Chimes)  . CORONARY ANGIOPLASTY WITH STENT PLACEMENT  02/26/1996   3.0x36mm Multi-Link stent to prox/mid LAD; L main normal & short, Cfx dominant and normal, PDA & PLA normal, RCA non-dominant with small PLA and normal (Dr. Marella Chimes)  . CORONARY ARTERY BYPASS GRAFT  07/31/1996   x2; LIMA to LAD and SVG to 1st diagonal (Dr. Remus Loffler)  . CORONARY ARTERY BYPASS GRAFT  07/31/1996   x1; SVG to distal LAD (Dr. Remus Loffler)  . NM MYOCAR PERF WALL MOTION  10/2010; 06/2014   a) LexiScan Cardiolite - mild perfusion defect r/t infarct/scar with mild periifarct ischemia in mid anterior & apical anterior region; EF 67%; abnormal, low risk study;; b) Small, moderate intensity Fixed perfusion defect - mid  Anterior Wall c/w known Anterior MI. LOW RISK   . TRANSTHORACIC ECHOCARDIOGRAM  02/2012; 06/2014   a) EF 50-55%, mild HK of anterospetal myocardium; mild MR;; b) EF 55-60%, mild HK of apical anterior wall.   Mild MR   Current Meds  Medication Sig  . albuterol (PROVENTIL HFA;VENTOLIN HFA) 108 (90 BASE) MCG/ACT inhaler Inhale 1-2 puffs into the lungs every 6 (six) hours as needed for wheezing or shortness of breath.  Marland Kitchen CALCIUM PO Take 1 tablet by mouth daily.   . cetirizine (ZYRTEC) 10 MG tablet Take 1 tablet (10 mg total) by mouth daily.  . cholestyramine (QUESTRAN) 4 g packet Take 1 packet by mouth daily.  . GuaiFENesin (TUSSIN PO) Take 30 mLs by mouth every 4 (four) hours as needed. For cough  . hyoscyamine (LEVSIN, ANASPAZ) 0.125 MG tablet Take 0.125 mg by mouth 2 (two) times daily.   Marland Kitchen  KLOR-CON 10 10 MEQ tablet TAKE AS INSTRUCTED BY YOUR PRESCRIBER  . metoprolol tartrate (LOPRESSOR) 25 MG tablet Take 1.5 tablets (37.5 mg total) by mouth 2 (two) times daily.  . nicotine (NICODERM CQ - DOSED IN MG/24 HR) 7 mg/24hr patch Place 1 patch (7 mg total) onto the skin daily. (Patient taking differently: Place 7 mg onto the skin daily. Patient states that she is actually chewing the 7 mg gum instead of using the patch)  . nitroGLYCERIN (NITROSTAT) 0.4 MG SL tablet Place 1 tablet (0.4 mg total) under the tongue every 5 (five) minutes as needed for chest pain.  . Probiotic Product (ALIGN PO) Take by mouth daily.  . rivaroxaban (XARELTO) 20 MG TABS tablet Take 1 tablet (20 mg total) by mouth daily with supper.  . rosuvastatin (CRESTOR) 20 MG tablet Take 1 tablet (20 mg total) by mouth daily.  Marland Kitchen SYNTHROID 50 MCG tablet TAKE ONE AND ONE-HALF (1 & 1/2) TABLETS DAILY    Current Outpatient Medications on File Prior to Visit  Medication Sig Dispense Refill  . albuterol (PROVENTIL HFA;VENTOLIN HFA) 108 (90 BASE) MCG/ACT inhaler Inhale 1-2 puffs into the lungs every 6 (six) hours as needed for wheezing or  shortness of breath. 1 Inhaler 0  . CALCIUM PO Take 1 tablet by mouth daily.     . cetirizine (ZYRTEC) 10 MG tablet Take 1 tablet (10 mg total) by mouth daily. 90 tablet 1  . cholestyramine (QUESTRAN) 4 g packet Take 1 packet by mouth daily.    . GuaiFENesin (TUSSIN PO) Take 30 mLs by mouth every 4 (four) hours as needed. For cough    . hyoscyamine (LEVSIN, ANASPAZ) 0.125 MG tablet Take 0.125 mg by mouth 2 (two) times daily.     Marland Kitchen KLOR-CON 10 10 MEQ tablet TAKE AS INSTRUCTED BY YOUR PRESCRIBER 180 tablet 3  . metoprolol tartrate (LOPRESSOR) 25 MG tablet Take 1.5 tablets (37.5 mg total) by mouth 2 (two) times daily. 45 tablet 0  . nicotine (NICODERM CQ - DOSED IN MG/24 HR) 7 mg/24hr patch Place 1 patch (7 mg total) onto the skin daily. (Patient taking differently: Place 7 mg onto the skin daily. Patient states that she is actually chewing the 7 mg gum instead of using the patch) 28 patch 2  . nitroGLYCERIN (NITROSTAT) 0.4 MG SL tablet Place 1 tablet (0.4 mg total) under the tongue every 5 (five) minutes as needed for chest pain. 25 tablet 1  . Probiotic Product (ALIGN PO) Take by mouth daily.    . rivaroxaban (XARELTO) 20 MG TABS tablet Take 1 tablet (20 mg total) by mouth daily with supper. 90 tablet 3  . rosuvastatin (CRESTOR) 20 MG tablet Take 1 tablet (20 mg total) by mouth daily. 90 tablet 3  . SYNTHROID 50 MCG tablet TAKE ONE AND ONE-HALF (1 & 1/2) TABLETS DAILY 135 tablet 4   No current facility-administered medications on file prior to visit.      Allergies  Allergen Reactions  . Atorvastatin Other (See Comments)    Gas pain in abdomen with no relief  . Iohexol Hives and Rash    Red dye  . Viberzi [Eluxadoline] Nausea And Vomiting   Social History   Tobacco Use  . Smoking status: Former Smoker    Packs/day: 0.25    Types: Cigarettes  . Smokeless tobacco: Former Systems developer  . Tobacco comment: Quit at time of MI, restarted in 2009  Substance Use Topics  . Alcohol use: No  Alcohol/week: 0.0 standard drinks  . Drug use: Not on file   Family History  Problem Relation Age of Onset  . Heart failure Other   . Diabetes Other   . Diabetes Mother   . Thyroid disease Paternal Aunt   . Thyroid disease Maternal Grandmother     ROS: A comprehensive was performed. Review of Systems  Constitutional: Negative for malaise/fatigue and weight loss (Gained weight after smoking cessation).  HENT: Negative for nosebleeds.        - now has dentures  Respiratory: Negative for cough (Some from smoking) and wheezing.   Cardiovascular: Negative for claudication and leg swelling (pretty good - but notes it in the summer heat more than now ).       Per history of present illness  Gastrointestinal: Positive for abdominal pain (Occasional with IBS), constipation and diarrhea (Occasional with IBS). Negative for blood in stool.       IBS symptoms; overall seem to be more stable.  Genitourinary: Negative for hematuria.  Musculoskeletal: Positive for joint pain (right shoulder pain).  Neurological: Positive for dizziness (only notes with extreme exertion). Negative for focal weakness and headaches.  Endo/Heme/Allergies: Positive for environmental allergies. Does not bruise/bleed easily.       Getting a little bit more energy since her Synthroid dose has been titrated.  Psychiatric/Behavioral: Negative for depression. The patient is nervous/anxious.   All other systems reviewed and are negative.   Wt Readings from Last 3 Encounters:  10/25/17 138 lb (62.6 kg)  10/10/17 137 lb (62.1 kg)  05/01/17 139 lb (63 kg)    PHYSICAL EXAM BP 124/66   Pulse 82   Ht 5\' 2"  (1.575 m)   Wt 138 lb (62.6 kg)   SpO2 97%   BMI 25.24 kg/m   Physical Exam  Constitutional: She is oriented to person, place, and time. She appears well-developed and well-nourished. No distress.  HENT:  Head: Normocephalic and atraumatic.  Eyes: Pupils are equal, round, and reactive to light. EOM are normal.   Neck: Normal range of motion. Neck supple. No hepatojugular reflux and no JVD present. Carotid bruit is not present.  Cardiovascular: Normal rate, regular rhythm and intact distal pulses. Exam reveals no gallop and no friction rub.  Murmur heard.  Harsh crescendo-decrescendo early systolic murmur is present with a grade of 1/6 at the upper right sternal border. Pulmonary/Chest: Effort normal and breath sounds normal. No respiratory distress. She has no wheezes. She has no rales.  Abdominal: Soft. Bowel sounds are normal. She exhibits no distension. There is no tenderness.  Musculoskeletal: Normal range of motion. She exhibits no edema.  Neurological: She is alert and oriented to person, place, and time.  Psychiatric: She has a normal mood and affect. Her behavior is normal. Judgment and thought content normal.  Nursing note and vitals reviewed.   Adult ECG Report  Not checked  Additional studies/ records that were reviewed today include:  Recent Labs:  Has not had recent labs checked. Lab Results  Component Value Date   CHOL 140 12/06/2016   HDL 57 12/06/2016   LDLCALC 59 12/06/2016   TRIG 119 12/06/2016   CHOLHDL 2.5 12/06/2016    ASSESSMENT / PLAN: Problem List Items Addressed This Visit    CAD- 100% LAD, s/p SVG-LAD after failed LIMA-LAD - Primary (Chronic)    Dizziness back in 1988.  Has not really had any significant angina since I have known her for the last 5 years.  She had initial  CABG followed by redo CABG.  Has not had an ischemic evaluation since June 2016.  Consider stress test next year.  On beta-blocker and statin. Not on aspirin because of Xarelto. Continue to stay active and walking and exercise.      Relevant Orders   Lipid panel   Comprehensive metabolic panel   Hyperlipidemia with target LDL less than 70 (Chronic)    Has not had lipids checked since December 2018.  Will order for next month.  Mostly because we have changed her from Lipitor to Crestor.       Relevant Orders   Lipid panel   Comprehensive metabolic panel   Paroxysmal atrial fibrillation ([place]); CHA2DS2-VASc Score 3. On Xarelto (Chronic)    No signs or symptoms of breakthrough episodes.  Remains on moderate dose Lopressor for rate control.  Tolerating well.  No signs of bradycardia. Remains on Xarelto anticoagulation with no bleeding issues.      Relevant Orders   EKG 12-Lead (Completed)   Tobacco abuse (Chronic)    Doing well with cessation.  Has not gone back to smoking.         Current medicines are reviewed at length with the patient today. (+/- concerns) none The following changes have been made: none  Patient Instructions  Medication Instructions:  ONLY TAKE  ONE DOSE OF POTASSIUM DAILY  If you need a refill on your cardiac medications before your next appointment, please call your pharmacy.   Lab work:  CMP LIPID ONE MONTH   If you have labs (blood work) drawn today and your tests are completely normal, you will receive your results only by: Marland Kitchen MyChart Message (if you have MyChart) OR . A paper copy in the mail If you have any lab test that is abnormal or we need to change your treatment, we will call you to review the results.  Testing/Procedures:  NOT NEEDED  Follow-Up: At [facility], you and your health needs are our priority.  As part of our continuing mission to provide you with exceptional heart care, we have created designated Provider Care Teams.  These Care Teams include your primary Cardiologist (physician) and Advanced Practice Providers (APPs -  Physician Assistants and Nurse Practitioners) who all work together to provide you with the care you need, when you need it. You will need a follow up appointment in 6 months.  Please call our office 2 months in advance to schedule this appointment.  You may see Glenetta Hew, MD or one of the following Advanced Practice Providers on your designated Care Team:   Rosaria Ferries, PA-C . Jory Sims, DNP, ANP  Any Other Special Instructions Will Be Listed Below (If Applicable).      Studies Ordered:   Orders Placed This Encounter  Procedures  . Lipid panel  . Comprehensive metabolic panel  . EKG 12-Lead      Glenetta Hew, M.D., M.S. Interventional Cardiologist   Pager # [phone]

## 2017-10-25 NOTE — Patient Instructions (Addendum)
Medication Instructions:  ONLY TAKE  ONE DOSE OF POTASSIUM DAILY  If you need a refill on your cardiac medications before your next appointment, please call your pharmacy.   Lab work:  CMP LIPID ONE MONTH   If you have labs (blood work) drawn today and your tests are completely normal, you will receive your results only by: Marland Kitchen MyChart Message (if you have MyChart) OR . A paper copy in the mail If you have any lab test that is abnormal or we need to change your treatment, we will call you to review the results.  Testing/Procedures:  NOT NEEDED  Follow-Up: At [facility], you and your health needs are our priority.  As part of our continuing mission to provide you with exceptional heart care, we have created designated Provider Care Teams.  These Care Teams include your primary Cardiologist (physician) and Advanced Practice Providers (APPs -  Physician Assistants and Nurse Practitioners) who all work together to provide you with the care you need, when you need it. You will need a follow up appointment in 6 months.  Please call our office 2 months in advance to schedule this appointment.  You may see Glenetta Hew, MD or one of the following Advanced Practice Providers on your designated Care Team:   Rosaria Ferries, PA-C . Jory Sims, DNP, ANP  Any Other Special Instructions Will Be Listed Below (If Applicable).

## 2017-10-27 ENCOUNTER — Encounter: Payer: Self-pay | Admitting: Cardiology

## 2017-10-27 NOTE — Assessment & Plan Note (Signed)
Doing well with cessation.  Has not gone back to smoking.

## 2017-10-27 NOTE — Assessment & Plan Note (Addendum)
No signs or symptoms of breakthrough episodes.  Remains on moderate dose Lopressor for rate control.  Tolerating well.  No signs of bradycardia. Remains on Xarelto anticoagulation with no bleeding issues.

## 2017-10-27 NOTE — Assessment & Plan Note (Signed)
Has not had lipids checked since December 2018.  Will order for next month.  Mostly because we have changed her from Lipitor to Crestor.

## 2017-10-27 NOTE — Assessment & Plan Note (Signed)
Dizziness back in 1988.  Has not really had any significant angina since I have known her for the last 5 years.  She had initial CABG followed by redo CABG.  Has not had an ischemic evaluation since June 2016.  Consider stress test next year.  On beta-blocker and statin. Not on aspirin because of Xarelto. Continue to stay active and walking and exercise.

## 2017-11-14 ENCOUNTER — Other Ambulatory Visit: Payer: Self-pay

## 2017-11-14 MED ORDER — RIVAROXABAN 20 MG PO TABS: 20.0000 mg | ORAL_TABLET | Freq: Every day | ORAL | 0 refills | Status: DC

## 2017-11-16 ENCOUNTER — Other Ambulatory Visit: Payer: Self-pay | Admitting: Pharmacist

## 2017-11-16 MED ORDER — RIVAROXABAN 20 MG PO TABS: 20.0000 mg | ORAL_TABLET | Freq: Every day | ORAL | 0 refills | Status: DC

## 2017-11-17 ENCOUNTER — Telehealth: Payer: Self-pay | Admitting: Cardiology

## 2017-11-17 MED ORDER — RIVAROXABAN 20 MG PO TABS: 20.0000 mg | ORAL_TABLET | Freq: Every day | ORAL | 3 refills | Status: DC

## 2017-11-17 NOTE — Telephone Encounter (Signed)
New message   Pt c/o medication issue:   1. Name of Medication: rivaroxaban (XARELTO) 20 MG TABS tablet  2. How are you currently taking this medication (dosage and times per day)? t time daily  3. Are you having a reaction (difficulty breathing--STAT)? No   4. What is your medication issue? Patient states that she only received 30 days on a [facility] medication. Please advise.

## 2017-11-17 NOTE — Telephone Encounter (Signed)
Returned call to patient 90 day refill for Xarelto sent to [facility].

## 2017-11-24 DIAGNOSIS — E785 Hyperlipidemia, unspecified: Secondary | ICD-10-CM | POA: Diagnosis not present

## 2017-11-24 DIAGNOSIS — I2510 Atherosclerotic heart disease of native coronary artery without angina pectoris: Secondary | ICD-10-CM | POA: Diagnosis not present

## 2017-11-24 LAB — LIPID PANEL
Chol/HDL Ratio: 2.5 ratio (ref 0.0–4.4)
Cholesterol, Total: 129 mg/dL (ref 100–199)
HDL: 52 mg/dL (ref >39)
LDL Calculated: 55 mg/dL (ref 0–99)
Triglycerides: 112 mg/dL (ref 0–149)
VLDL Cholesterol Cal: 22 mg/dL (ref 5–40)

## 2017-11-24 LAB — COMPREHENSIVE METABOLIC PANEL
ALT: 23 IU/L (ref 0–32)
AST: 18 IU/L (ref 0–40)
Albumin/Globulin Ratio: 1.8 (ref 1.2–2.2)
Albumin: 4.2 g/dL (ref 3.6–4.8)
Alkaline Phosphatase: 84 IU/L (ref 39–117)
BUN/Creatinine Ratio: 11 — ABNORMAL LOW (ref 12–28)
BUN: 11 mg/dL (ref 8–27)
Bilirubin Total: 0.3 mg/dL (ref 0.0–1.2)
CO2: 21 mmol/L (ref 20–29)
Calcium: 9.1 mg/dL (ref 8.7–10.3)
Chloride: 104 mmol/L (ref 96–106)
Creatinine, Ser: 0.98 mg/dL (ref 0.57–1.00)
GFR calc Af Amer: 71 mL/min/{1.73_m2} (ref >59)
GFR calc non Af Amer: 62 mL/min/{1.73_m2} (ref >59)
Globulin, Total: 2.3 g/dL (ref 1.5–4.5)
Glucose: 103 mg/dL — ABNORMAL HIGH (ref 65–99)
Potassium: 4.3 mmol/L (ref 3.5–5.2)
Sodium: 139 mmol/L (ref 134–144)
Total Protein: 6.5 g/dL (ref 6.0–8.5)

## 2017-11-27 ENCOUNTER — Other Ambulatory Visit: Payer: Self-pay | Admitting: Endocrinology

## 2017-12-05 DIAGNOSIS — R143 Flatulence: Secondary | ICD-10-CM | POA: Diagnosis not present

## 2017-12-05 DIAGNOSIS — K219 Gastro-esophageal reflux disease without esophagitis: Secondary | ICD-10-CM | POA: Diagnosis not present

## 2017-12-05 DIAGNOSIS — Z86010 Personal history of colonic polyps: Secondary | ICD-10-CM | POA: Diagnosis not present

## 2017-12-05 DIAGNOSIS — K580 Irritable bowel syndrome with diarrhea: Secondary | ICD-10-CM | POA: Diagnosis not present

## 2018-02-05 ENCOUNTER — Other Ambulatory Visit: Payer: Self-pay | Admitting: Cardiology

## 2018-02-05 NOTE — Telephone Encounter (Signed)
Rx request sent to pharmacy.  

## 2018-02-21 ENCOUNTER — Telehealth: Payer: Self-pay | Admitting: Cardiology

## 2018-02-21 DIAGNOSIS — Z79899 Other long term (current) drug therapy: Secondary | ICD-10-CM

## 2018-02-21 NOTE — Telephone Encounter (Signed)
Called patient she states that at last visit she was told by Dr.Harding to start taking only one potassium a day, but over the weekend she began having some issues, she states she had heart palpitations, SOB, and had some issues with her IBS as well causing her to have other issues. She looked up low potassium symptoms and it was the problems she was having so she began 3 days ago taking two of the potassium again, and since starting that she has been better and has had no issues.  I notified her that I would make Dr.Harding and his nurse aware of this in case of any changes we would get in touch if he only wanted her on one at this time.  Patient verbalized understanding.

## 2018-02-21 NOTE — Telephone Encounter (Signed)
New Message   Pt c/o medication issue:  1. Name of Medication: Klor-con   2. How are you currently taking this medication (dosage and times per day)? 10 10 MEQ  3. Are you having a reaction (difficulty breathing--STAT)? No  4. What is your medication issue? PT says she is starting to take 2x daily because she noticed symptoms, Now that she is back on the 2x daily she says she is feeling better.

## 2018-02-21 NOTE — Telephone Encounter (Signed)
Probably the best thing to do is have her come in and get a chemistry panel (BMP just to look and see what her potassium levels are.  Glenetta Hew, MD

## 2018-02-22 NOTE — Telephone Encounter (Signed)
Called, spoke with husband, advised that patient needed to have blood work completed. Advised of lab hours and being a walk-in lab. Lab order was placed, and patient husband verbalized understanding.

## 2018-04-05 ENCOUNTER — Other Ambulatory Visit: Payer: Self-pay | Admitting: Cardiology

## 2018-04-05 NOTE — Telephone Encounter (Signed)
crestor refilled 

## 2018-05-01 ENCOUNTER — Telehealth: Payer: Self-pay | Admitting: Cardiology

## 2018-05-01 NOTE — Telephone Encounter (Signed)
Mychart, no smartphone, pre reg complete 05/01/18 AF °

## 2018-05-02 ENCOUNTER — Telehealth: Payer: Self-pay | Admitting: *Deleted

## 2018-05-02 ENCOUNTER — Encounter: Payer: Self-pay | Admitting: Cardiology

## 2018-05-02 ENCOUNTER — Telehealth (INDEPENDENT_AMBULATORY_CARE_PROVIDER_SITE_OTHER): Payer: Medicare Other | Admitting: Cardiology

## 2018-05-02 VITALS — BP 130/81 | HR 60 | Ht 62.0 in | Wt 137.0 lb

## 2018-05-02 DIAGNOSIS — I2510 Atherosclerotic heart disease of native coronary artery without angina pectoris: Secondary | ICD-10-CM

## 2018-05-02 DIAGNOSIS — I480 Paroxysmal atrial fibrillation: Secondary | ICD-10-CM

## 2018-05-02 DIAGNOSIS — F17210 Nicotine dependence, cigarettes, uncomplicated: Secondary | ICD-10-CM

## 2018-05-02 DIAGNOSIS — E785 Hyperlipidemia, unspecified: Secondary | ICD-10-CM

## 2018-05-02 DIAGNOSIS — Z720 Tobacco use: Secondary | ICD-10-CM

## 2018-05-02 MED ORDER — VARENICLINE TARTRATE 1 MG PO TABS: 1.0000 mg | ORAL_TABLET | Freq: Two times a day (BID) | ORAL | 0 refills | Status: DC

## 2018-05-02 MED ORDER — VARENICLINE TARTRATE 0.5 MG X 11 & 1 MG X 42 PO MISC: ORAL | 0 refills | Status: DC

## 2018-05-02 NOTE — Assessment & Plan Note (Signed)
No SSx of breakthrough episodes.  Rate control with metoprolol. Anticoagulated with Xarelto

## 2018-05-02 NOTE — Telephone Encounter (Signed)
SPOKE TO PATIENT , INSTRUCTION GIVEN , AVS SENT VIA MYCHART PATIENT  VERBALIZED UNDERSTANDING

## 2018-05-02 NOTE — Assessment & Plan Note (Signed)
Well controlled on statin -- due for f/u in November. Better with Crestor

## 2018-05-02 NOTE — Progress Notes (Signed)
Virtual Visit via Telephone Note   This visit type was conducted due to national recommendations for restrictions regarding the COVID-19 Pandemic (e.g. social distancing) in an effort to limit this patient's exposure and mitigate transmission in our community.  Due to her co-morbid illnesses, this patient is at least at moderate risk for complications without adequate follow up.  This format is felt to be most appropriate for this patient at this time.  The patient did not have access to video technology/had technical difficulties with video requiring transitioning to audio format only (telephone).  All issues noted in this document were discussed and addressed.  No physical exam could be performed with this format.  Please refer to the patient's chart for her  consent to telehealth for [facility].   Patient has given verbal permission to conduct this visit via virtual appointment and to bill insurance 05/02/2018 6:24 PM     Evaluation Performed:  Follow-up visit  Date:  05/02/2018   ID:  Dominique Barber, Dominique Barber 03-Dec-1955, MRN [id number]  Patient Location: Home Provider Location: Office  PCP:  System, Pcp Not In  Cardiologist:  Glenetta Hew, MD  Electrophysiologist:  None   Chief Complaint:  6 month f/u - CAD.  History of Present Illness:    Dominique Barber is a 63 y.o. female with PMH notable for CAD-CABG & PAF who presents via audio/video conferencing for a telehealth visit today.  Past Cardiac History: ? Anterior STEMI 54 (age 55): She had EF arrest with cardiac shock and mild anoxic brain injury (with status epilepticus). --> Emergent PTCA to the LAD ? She then had BMS stent placed in February 1998 ? Shortly thereafter she underwent CABG with the LIMA-LAD and SVG-D2. - For in-stent restenosis  Postop complication -- acute occlusion of the LIMA graft -> recurrent anterior MI -> redo CABG with SVG-mLAD.   Most recent Cath Jan 2005: Patent SVG-D1 & SVG-mLAD- (no  significant change in disease). Large bifurcating D1 branch that is filled via retrograde flow from the LAD. Dominant circumflex with small OM1 large bifurcating OM 2 as well as a large OM 3 before terminating in a bifurcation into LPL and large PDA. The LIMA LAD was atretic/occluded.  -> EF roughly 50% with inferoapical hypokinesis.  Myoview June 2016 - Small mid anterior wall scar without ischemia. Low risk study         Afib - April 2016 - now on Xarelto.      By her report, since her CABG she has been troubled with IBS   Dominique Barber was last seen in Oct 2019: Was doing well.  Intermittent skipped beats.  Interval History:  Having stomach issues - related to GOP.  Abdominal Pain & bloating -- No chest pain.  Eating maybe 1 meal per day. (her scale has broken).  Has to take FD guard.  No major cardiac issues. Trying to be active - but has to wait for stomach to settle.    Cardiovascular ROS: no chest pain or dyspnea on exertion positive for - edema and SOB with Gi bloating; end of day edema.  Rare palpitations.  negative for - irregular heartbeat, orthopnea, paroxysmal nocturnal dyspnea, rapid heart rate, shortness of breath or syncope/near syncope. TIA / amaurosis fugax.  The patient does not have symptoms concerning for COVID-19 infection (fever, chills, cough, or new shortness of breath).  The patient is practicing social distancing.  ROS:  Please see the history of present illness.  Review of Systems  Constitutional: Negative for malaise/fatigue.  HENT: Positive for congestion (with some difficulty breathing).   Respiratory: Negative for shortness of breath.   Cardiovascular: Positive for palpitations (rare -- if bad GI day) and leg swelling (well controlled - end of day, goes down with elevation.).  Gastrointestinal: Positive for abdominal pain, heartburn and nausea. Negative for blood in stool, constipation (just air buildup) and melena.       Bloating / air buildup from  GOP -- takes [place]; Has been every day for the past few months  Genitourinary: Negative for hematuria.  Musculoskeletal: Positive for joint pain (stable).  Neurological: Negative for dizziness and focal weakness.  Endo/Heme/Allergies: Positive for environmental allergies (Allergies & pollen).  Psychiatric/Behavioral: Negative.   All other systems reviewed and are negative.   Past Medical History:  Diagnosis Date  . CAD in native artery 1998   Followup 1 month post MI revealed 95% ISR in PTCA site --> PCI with 3.0 mm x 25 mm BMS stent in proximal LAD --> 5 months later recurrent ISR of LAD BMS that compromised D1 --> referred for CABG .--> Emergent redo single vessel CABG with SVG-mLAD for occluded LIMA  . COPD (chronic obstructive pulmonary disease) ([place])   . GERD (gastroesophageal reflux disease)   . Hyperlipidemia   . Hypothyroidism    On Synthroid  . Iron deficiency anemia   . Irritable bowel syndrome    Followed by Dr. Juanita Craver.  Marland Kitchen PAF (paroxysmal atrial fibrillation) ([place]) 05/03/2014   New onset - originally with RVR  . S/P CABG x 2 07/1996   Initially LIMA-LAD, SVG-D1 --> immediate LIMA failure post CABG with anterior MI --> urgent redo SVG-LAD beyond D2.; Widely patent grafts as of 2005 with left dominant system. Graft to the diagonal branch has a very small target vessel but the vein graft to LAD retrograde fills a large bifurcating D2 and antegrade fills a large D3.  . ST elevation (STEMI) myocardial infarction involving left anterior descending coronary artery ([place]) 24/26/8341   Complicated by VF arrest, and anoxic brain injury with status epilepticus; POBA of LAD --> 6 months later, after 2 vessel CABG she had postop complication with occluded LIMA/second anterior STEMI  . Tobacco abuse    Past Surgical History:  Procedure Laterality Date  . APPENDECTOMY    . CARDIAC CATHETERIZATION  07/30/1996   normal L main, LAD w/95% stenosis just before stent, diffuse  80-85% end-stent restenosis, 1st diagonal with70-75% ostial stenosis, large optional diagonal that was normal, Cfx with 2 marginals and distal PLA (all normal), RCA normal (Dr. Marella Chimes)  . CARDIAC CATHETERIZATION  01/31/2003   LAD totally occluded in prox 3rd, patent Cfx, SVG to mid LAd patent, SVG to small DX1 patent, LIMA to LAD was atretic and essentially occluded in junction of prox & mid 3rd, R barciocephalic and subclavian were normal (Dr. Marella Chimes)  . CESAREAN SECTION    . CHOLECYSTECTOMY    . CORONARY ANGIOPLASTY  01/19/1996   acute anterior wall MI, anoxic encephalopahty, VF; LCfx free of disease and dominant, RCA patent, L main short and patent, LAD with 70-80% narrowing and focal 95% stenosis in distal 3rd with balloon angioplasty (Dr. Marella Chimes)  . CORONARY ANGIOPLASTY WITH STENT PLACEMENT  02/26/1996   3.0x24mm Multi-Link stent to prox/mid LAD; L main normal & short, Cfx dominant and normal, PDA & PLA normal, RCA non-dominant with small PLA and normal (Dr. Marella Chimes)  . CORONARY ARTERY  BYPASS GRAFT  07/31/1996   x2; LIMA to LAD and SVG to 1st diagonal (Dr. Remus Loffler)  . CORONARY ARTERY BYPASS GRAFT  07/31/1996   x1; SVG to distal LAD (Dr. Remus Loffler)  . NM MYOCAR PERF WALL MOTION  10/2010; 06/2014   a) LexiScan Cardiolite - mild perfusion defect r/t infarct/scar with mild periifarct ischemia in mid anterior & apical anterior region; EF 67%; abnormal, low risk study;; b) Small, moderate intensity Fixed perfusion defect - mid Anterior Wall c/w known Anterior MI. LOW RISK   . TRANSTHORACIC ECHOCARDIOGRAM  02/2012; 06/2014   a) EF 50-55%, mild HK of anterospetal myocardium; mild MR;; b) EF 55-60%, mild HK of apical anterior wall.   Mild MR     Current Meds  Medication Sig  . albuterol (PROVENTIL HFA;VENTOLIN HFA) 108 (90 BASE) MCG/ACT inhaler Inhale 1-2 puffs into the lungs every 6 (six) hours as needed for wheezing or shortness of breath.  Marland Kitchen CALCIUM PO Take 1 tablet by mouth daily.    . cetirizine (ZYRTEC) 10 MG tablet TAKE 1 TABLET DAILY  . cholestyramine (QUESTRAN) 4 g packet Take 1 packet by mouth daily.  . GuaiFENesin (TUSSIN PO) Take 30 mLs by mouth every 4 (four) hours as needed. For cough  . hyoscyamine (LEVSIN, ANASPAZ) 0.125 MG tablet Take 0.125 mg by mouth 2 (two) times daily.   Marland Kitchen KLOR-CON 10 10 MEQ tablet TAKE AS INSTRUCTED BY YOUR PRESCRIBER  . metoprolol tartrate (LOPRESSOR) 25 MG tablet TAKE ONE AND ONE-HALF TABLETS TWICE A DAY  . nitroGLYCERIN (NITROSTAT) 0.4 MG SL tablet Place 1 tablet (0.4 mg total) under the tongue every 5 (five) minutes as needed for chest pain.  . Probiotic Product (ALIGN PO) Take by mouth daily.  . rivaroxaban (XARELTO) 20 MG TABS tablet Take 1 tablet (20 mg total) by mouth daily with supper.  . rosuvastatin (CRESTOR) 20 MG tablet TAKE 1 TABLET DAILY  . SYNTHROID 50 MCG tablet TAKE ONE AND ONE-HALF (1 & 1/2) TABLETS DAILY     Allergies:   Atorvastatin; Iohexol; and Viberzi [eluxadoline]   Social History   Tobacco Use  . Smoking status: Current Some Day Smoker    Packs/day: 0.25    Types: Cigarettes  . Smokeless tobacco: Former Systems developer  . Tobacco comment: Quit at time of MI, restarted in 2009  Substance Use Topics  . Alcohol use: No    Alcohol/week: 0.0 standard drinks  . Drug use: Not on file     Family Hx: The patient's family history includes Diabetes in her mother and another family member; Heart failure in an other family member; Thyroid disease in her maternal grandmother and paternal aunt.   Prior CV studies:   The following studies were reviewed today: . none  Labs/Other Tests and Data Reviewed:    EKG:  No ECG reviewed.  Recent Labs: 10/06/2017: TSH 1.49 11/24/2017: ALT 23; BUN 11; Creatinine, Ser 0.98; Potassium 4.3; Sodium 139   Recent Lipid Panel Lab Results  Component Value Date/Time   CHOL 129 11/24/2017 11:26 AM   TRIG 112 11/24/2017 11:26 AM   HDL 52 11/24/2017 11:26 AM   CHOLHDL 2.5 11/24/2017  11:26 AM   CHOLHDL 3.9 10/16/2015 12:21 PM   LDLCALC 55 11/24/2017 11:26 AM    Wt Readings from Last 3 Encounters:  05/02/18 137 lb (62.1 kg)  10/25/17 138 lb (62.6 kg)  10/10/17 137 lb (62.1 kg)     Objective:    Vital Signs:  BP 130/81  Pulse 60   Ht 5\' 2"  (1.575 m)   Wt 137 lb (62.1 kg)   SpO2 98%   BMI 25.06 kg/m   VITAL SIGNS:  reviewed GEN:  no acute distress RESPIRATORY:  non-labored NEURO:  alert and oriented x 3, no obvious focal deficit PSYCH:  normal affect   ASSESSMENT & PLAN:    Problem List Items Addressed This Visit    CAD- 100% LAD, s/p SVG-LAD after failed LIMA-LAD - Primary (Chronic)    No further CAD issues - no angina or CHF Sx.   Will discuss f/u Myoview in Fall follow-up.  On Statin & Metoprolol  -- no ASA b/c Xarelto  Continue Exercise      Relevant Orders   Lipid panel   Comprehensive metabolic panel   Hyperlipidemia with target LDL less than 70 (Chronic)    Well controlled on statin -- due for f/u in November. Better with Crestor      Relevant Orders   Lipid panel   Comprehensive metabolic panel   Paroxysmal atrial fibrillation ([place]); CHA2DS2-VASc Score 3. On Xarelto (Chronic)    No SSx of breakthrough episodes.  Rate control with metoprolol. Anticoagulated with Xarelto      Tobacco abuse (Chronic)    Still uses nicorette gum mostly -- rarely has a cigarette.  Congratulated efforts. Thinks that Chantix may help.   Will try Chantix -- & change to regular gum.  Spent 5 min discussing options & plan.         COVID-19 Education: The signs and symptoms of COVID-19 were discussed with the patient and how to seek care for testing (follow up with PCP or arrange E-visit).   The importance of social distancing was discussed today.  Time:   Today, I have spent 20 minutes with the patient with telehealth technology discussing the above problems.     Medication Adjustments/Labs and Tests Ordered: Current medicines are reviewed  at length with the patient today.  Concerns regarding medicines are outlined above.  Medication Instructions:    Chantix starter pack with 2 refill ([facility])  Tests Ordered: Orders Placed This Encounter  Procedures  . Lipid panel  . Comprehensive metabolic panel   Lipids & CMP in November - prior to f/u.  Medication Changes: Meds ordered this encounter  Medications  . DISCONTD: varenicline (CHANTIX STARTING MONTH PAK) 0.5 MG X 11 & 1 MG X 42 tablet    Sig: Take one 0.5 mg tablet by mouth once daily for 3 days, then increase to one 0.5 mg tablet twice daily for 4 days, then increase to one 1 mg tablet twice daily.    Dispense:  53 tablet    Refill:  0  . varenicline (CHANTIX CONTINUING MONTH PAK) 1 MG tablet    Sig: Take 1 tablet (1 mg total) by mouth 2 (two) times daily.    Dispense:  180 tablet    Refill:  0    Start after the starter pack  . varenicline (CHANTIX STARTING MONTH PAK) 0.5 MG X 11 & 1 MG X 42 tablet    Sig: Take  0.5 mg tablet by mouth once daily for 3 days, increase to  0.5 mg tablet twice daily for 4 days, increase to  1 mg tablet twice daily.    Dispense:  53 tablet    Refill:  0    Disposition:  Follow up in 6 month(s) after labs done    Signed, Glenetta Hew, MD  05/02/2018 6:24 PM  [facility]

## 2018-05-02 NOTE — Patient Instructions (Addendum)
Medication Instructions:    Chantix starter pack with 2 refill ([facility]) AFTER USING STARTER PACK FOR ONE MONTH   Chantix 1 mg  Twice a day  MAY USE FOT 2 MONTHS   If you need a refill on your cardiac medications before your next appointment, please call your pharmacy.   Lab work: [place] in November - prior to f/u. ( WILL MAIL LAB SLIP IN OCT 2020)  If you have labs (blood work) drawn today and your tests are completely normal, you will receive your results only by: Marland Kitchen MyChart Message (if you have MyChart) OR . A paper copy in the mail If you have any lab test that is abnormal or we need to change your treatment, we will call you to review the results. ` Testing/Procedures: NOT NEEDED  Follow-Up: At [facility], you and your health needs are our priority.  As part of our continuing mission to provide you with exceptional heart care, we have created designated Provider Care Teams.  These Care Teams include your primary Cardiologist (physician) and Advanced Practice Providers (APPs -  Physician Assistants and Nurse Practitioners) who all work together to provide you with the care you need, when you need it. You will need a follow up appointment in 7 months NOV 2020.  Please call our office 2 months in advance to schedule this appointment.  You may see Glenetta Hew, MD or one of the following Advanced Practice Providers on your designated Care Team:   Rosaria Ferries, PA-C . Jory Sims, DNP, ANP  Any Other Special Instructions Will Be Listed Below (If Applicable).

## 2018-05-02 NOTE — Assessment & Plan Note (Addendum)
Still uses nicorette gum mostly -- rarely has a cigarette.  Congratulated efforts. Thinks that Chantix may help.   Will try Chantix -- & change to regular gum.  Spent 5 min discussing options & plan.

## 2018-05-02 NOTE — Assessment & Plan Note (Signed)
No further CAD issues - no angina or CHF Sx.   Will discuss f/u Myoview in Fall follow-up.  On Statin & Metoprolol  -- no ASA b/c Xarelto  Continue Exercise

## 2018-07-27 ENCOUNTER — Other Ambulatory Visit: Payer: Self-pay

## 2018-07-27 ENCOUNTER — Ambulatory Visit
Admission: EM | Admit: 2018-07-27 | Discharge: 2018-07-27 | Disposition: A | Discharge status: Home / Self Care | Payer: Medicare Other | Attending: Physician Assistant | Admitting: Physician Assistant

## 2018-07-27 ENCOUNTER — Encounter: Payer: Self-pay | Admitting: Emergency Medicine

## 2018-07-27 ENCOUNTER — Ambulatory Visit (INDEPENDENT_AMBULATORY_CARE_PROVIDER_SITE_OTHER): Payer: Medicare Other

## 2018-07-27 DIAGNOSIS — M25571 Pain in right ankle and joints of right foot: Secondary | ICD-10-CM | POA: Diagnosis not present

## 2018-07-27 DIAGNOSIS — S82831A Other fracture of upper and lower end of right fibula, initial encounter for closed fracture: Secondary | ICD-10-CM

## 2018-07-27 DIAGNOSIS — W1842XA Slipping, tripping and stumbling without falling due to stepping into hole or opening, initial encounter: Secondary | ICD-10-CM | POA: Diagnosis not present

## 2018-07-27 MED ORDER — HYDROCODONE-ACETAMINOPHEN 5-325 MG PO TABS: 1.0000 | ORAL_TABLET | Freq: Four times a day (QID) | ORAL | 0 refills | Status: DC | PRN

## 2018-07-27 NOTE — ED Provider Notes (Signed)
[facility]    CSN: [id number] Arrival date & time: 07/27/18  1236     History   Chief Complaint Chief Complaint  Patient presents with   Ankle Injury    HPI PASHA GADISON is a 63 y.o. female.   63 year old female with history of CAD, COPD, paroxysmal [name] on Xarelto comes in for evaluation of right ankle pain after injury yesterday.  She stepped into a hole, and inverted ankle.  She has since then have pain with swelling to the lateral right ankle and has not been able to bear weight.  She denies numbness, tingling.  She has been using husband's walker to hop around.  Taking Tylenol with no relief.     Past Medical History:  Diagnosis Date   CAD in native artery 1998   Followup 1 month post MI revealed 95% ISR in PTCA site --> PCI with 3.0 mm x 25 mm BMS stent in proximal LAD --> 5 months later recurrent ISR of LAD BMS that compromised D1 --> referred for CABG .--> Emergent redo single vessel CABG with SVG-mLAD for occluded LIMA   COPD (chronic obstructive pulmonary disease) (HCC)    GERD (gastroesophageal reflux disease)    Hyperlipidemia    Hypothyroidism    On Synthroid   Iron deficiency anemia    Irritable bowel syndrome    Followed by Dr. Juanita Craver.   PAF (paroxysmal atrial fibrillation) ([place]) 05/03/2014   New onset - originally with RVR   S/P CABG x 2 07/1996   Initially LIMA-LAD, SVG-D1 --> immediate LIMA failure post CABG with anterior MI --> urgent redo SVG-LAD beyond D2.; Widely patent grafts as of 2005 with left dominant system. Graft to the diagonal branch has a very small target vessel but the vein graft to LAD retrograde fills a large bifurcating D2 and antegrade fills a large D3.   ST elevation (STEMI) myocardial infarction involving left anterior descending coronary artery ([place]) 43/15/4008   Complicated by VF arrest, and anoxic brain injury with status epilepticus; POBA of LAD --> 6 months later, after 2 vessel CABG she had  postop complication with occluded LIMA/second anterior STEMI   Tobacco abuse     Patient Active Problem List   Diagnosis Date Noted   Paroxysmal atrial fibrillation ([place]); CHA2DS2-VASc Score 3. On Xarelto 05/04/2014   COPD (chronic obstructive pulmonary disease) ([place]) 05/04/2014   Hyperlipidemia with target LDL less than 70    Tobacco abuse    Hypothyroid 12/03/2012   Multinodular goiter 12/03/2012   CAD- 100% LAD, s/p SVG-LAD after failed LIMA-LAD 11/14/1996    Class: Chronic   S/P CABG x 2 07/03/1996   ST elevation (STEMI) myocardial infarction involving left anterior descending coronary artery ([place]) 01/19/1996    Past Surgical History:  Procedure Laterality Date   APPENDECTOMY     CARDIAC CATHETERIZATION  07/30/1996   normal L main, LAD w/95% stenosis just before stent, diffuse 80-85% end-stent restenosis, 1st diagonal with70-75% ostial stenosis, large optional diagonal that was normal, Cfx with 2 marginals and distal PLA (all normal), RCA normal (Dr. Marella Chimes)   CARDIAC CATHETERIZATION  01/31/2003   LAD totally occluded in prox 3rd, patent Cfx, SVG to mid LAd patent, SVG to small DX1 patent, LIMA to LAD was atretic and essentially occluded in junction of prox & mid 3rd, R barciocephalic and subclavian were normal (Dr. Marella Chimes)   [place]  01/19/1996  acute anterior wall MI, anoxic encephalopahty, VF; LCfx free of disease and dominant, RCA patent, L main short and patent, LAD with 70-80% narrowing and focal 95% stenosis in distal 3rd with balloon angioplasty (Dr. Marella Chimes)   [place]  02/26/1996   3.0x44mm Multi-Link stent to prox/mid LAD; L main normal & short, Cfx dominant and normal, PDA & PLA normal, RCA non-dominant with small PLA and normal (Dr. Marella Chimes)   [facility] GRAFT  07/31/1996   x2; LIMA to LAD and SVG to 1st diagonal (Dr. Remus Loffler)    CORONARY ARTERY BYPASS GRAFT  07/31/1996   x1; SVG to distal LAD (Dr. Remus Loffler)   [place]  10/2010; 06/2014   a) LexiScan Cardiolite - mild perfusion defect r/t infarct/scar with mild periifarct ischemia in mid anterior & apical anterior region; EF 67%; abnormal, low risk study;; b) Small, moderate intensity Fixed perfusion defect - mid Anterior Wall c/w known Anterior MI. LOW RISK    TRANSTHORACIC ECHOCARDIOGRAM  02/2012; 06/2014   a) EF 50-55%, mild HK of anterospetal myocardium; mild MR;; b) EF 55-60%, mild HK of apical anterior wall.   Mild MR    OB History   No obstetric history on file.      Home Medications    Prior to Admission medications   Medication Sig Start Date End Date Taking? Authorizing Provider  albuterol (PROVENTIL HFA;VENTOLIN HFA) 108 (90 BASE) MCG/ACT inhaler Inhale 1-2 puffs into the lungs every 6 (six) hours as needed for wheezing or shortness of breath. 05/05/14   Hongalgi, Lenis Dickinson, MD  CALCIUM PO Take 1 tablet by mouth daily.     [provider]  cetirizine (ZYRTEC) 10 MG tablet TAKE 1 TABLET DAILY 11/27/17   Elayne Snare, MD  cholestyramine Lucrezia Starch) 4 g packet Take 1 packet by mouth daily. 09/09/16   [provider]  GuaiFENesin (TUSSIN PO) Take 30 mLs by mouth every 4 (four) hours as needed. For cough    [provider]  HYDROcodone-acetaminophen (NORCO/VICODIN) 5-325 MG tablet Take 1 tablet by mouth every 6 (six) hours as needed for severe pain. 07/27/18   Tasia Catchings, Santoria Chason V, PA-C  hyoscyamine (LEVSIN, ANASPAZ) 0.125 MG tablet Take 0.125 mg by mouth 2 (two) times daily.     [provider]  KLOR-CON 10 10 MEQ tablet TAKE AS INSTRUCTED BY YOUR PRESCRIBER 05/30/17   Leonie Man, MD  metoprolol tartrate (LOPRESSOR) 25 MG tablet TAKE ONE AND ONE-HALF TABLETS TWICE A DAY 02/05/18   Leonie Man, MD  nitroGLYCERIN (NITROSTAT) 0.4 MG SL tablet Place 1 tablet (0.4 mg total) under the tongue every 5 (five) minutes as needed  for chest pain. 09/30/16 05/02/18  Almyra Deforest, PA  Probiotic Product (ALIGN PO) Take by mouth daily.    [provider]  rivaroxaban (XARELTO) 20 MG TABS tablet Take 1 tablet (20 mg total) by mouth daily with supper. 11/17/17   Leonie Man, MD  rosuvastatin (CRESTOR) 20 MG tablet TAKE 1 TABLET DAILY 04/05/18   Leonie Man, MD  SYNTHROID 50 MCG tablet TAKE ONE AND ONE-HALF (1 & 1/2) TABLETS DAILY 08/29/17   Elayne Snare, MD  varenicline (CHANTIX CONTINUING MONTH PAK) 1 MG tablet Take 1 tablet (1 mg total) by mouth 2 (two) times daily. 05/02/18   Leonie Man, MD  varenicline (CHANTIX STARTING MONTH PAK) 0.5 MG X 11 & 1 MG X 42 tablet Take  0.5 mg tablet  by mouth once daily for 3 days, increase to  0.5 mg tablet twice daily for 4 days, increase to  1 mg tablet twice daily. 05/02/18   Leonie Man, MD    Family History Family History  Problem Relation Age of Onset   Heart failure Other    Diabetes Other    Diabetes Mother    Thyroid disease Paternal Aunt    Thyroid disease Maternal Grandmother     Social History Social History   Tobacco Use   Smoking status: Current Some Day Smoker    Packs/day: 0.25    Types: Cigarettes   Smokeless tobacco: Former Systems developer   Tobacco comment: Quit at time of MI, restarted in 2009  Substance Use Topics   Alcohol use: No    Alcohol/week: 0.0 standard drinks   Drug use: Not on file     Allergies   Atorvastatin, Iohexol, and Viberzi [eluxadoline]   Review of Systems Review of Systems  Reason unable to perform ROS: See HPI as above.     Physical Exam Triage Vital Signs ED Triage Vitals  Enc Vitals Group     BP 07/27/18 1249 107/65     Pulse Rate 07/27/18 1249 63     Resp 07/27/18 1249 18     Temp 07/27/18 1249 98.4 F (36.9 C)     Temp Source 07/27/18 1249 Oral     SpO2 07/27/18 1249 97 %     Weight --      Height --      Head Circumference --      Peak Flow --      Pain Score 07/27/18 1245 9     Pain Loc  --      Pain Edu? --      Excl. in [place]? --    No data found.  Updated Vital Signs BP 107/65 (BP Location: Left Arm)    Pulse 63    Temp 98.4 F (36.9 C) (Oral)    Resp 18    SpO2 97%   Visual Acuity Right Eye Distance:   Left Eye Distance:   Bilateral Distance:    Right Eye Near:   Left Eye Near:    Bilateral Near:     Physical Exam Constitutional:      General: She is not in acute distress.    Appearance: She is well-developed. She is not diaphoretic.  HENT:     Head: Normocephalic and atraumatic.  Eyes:     Conjunctiva/sclera: Conjunctivae normal.     Pupils: Pupils are equal, round, and reactive to light.  Musculoskeletal:     Comments: Significant swelling to the right lateral ankle with no contusion, erythema, warmth.  Tenderness to palpation of lateral ankle.  No tenderness to palpation of the foot.  Range of motion and strength deferred.  Sensation intact and equal bilaterally.  Pedal pulse 2+.  Neurological:     Mental Status: She is alert and oriented to person, place, and time.      UC Treatments / Results  Labs (all labs ordered are listed, but only abnormal results are displayed) Labs Reviewed - No data to display  EKG   Radiology Dg Ankle Complete Right  Result Date: 07/27/2018 CLINICAL DATA:  Right ankle pain. EXAM: RIGHT ANKLE - COMPLETE 3+ VIEW COMPARISON:  No prior. FINDINGS: Diffuse soft tissue swelling. Surgical clips noted over the ankle. Oblique fracture noted of the distal fibula. Medial malleolus intact. IMPRESSION: Diffuse soft tissue swelling. Oblique fracture  of the distal fibula. Electronically Signed   By: Marcello Moores  Register   On: 07/27/2018 13:16    Procedures Procedures (including critical care time)  Medications Ordered in UC Medications - No data to display  Initial Impression / Assessment and Plan / UC Course  I have reviewed the triage vital signs and the nursing notes.  Pertinent labs & imaging results that were available  during my care of the patient were reviewed by me and considered in my medical decision making (see chart for details).    Discussed x-ray results.  Will put patient in cam walker, and continue using walker to remain nonweightbearing until cleared by orthopedics.  Will have patient continue Tylenol 3 times a day, and add Norco additional pain relief.  Discussed Tylenol dosage of Norco, and daily limits of Tylenol dosage.  Ice compress, elevation, rest.  Patient to follow-up with orthopedics for further evaluation and management needed.  Return precautions given.  Patient expresses understanding and agrees to plan.  Final Clinical Impressions(s) / UC Diagnoses   Final diagnoses:  Other closed fracture of distal end of right fibula, initial encounter    ED Prescriptions    Medication Sig Dispense Auth. Provider   HYDROcodone-acetaminophen (NORCO/VICODIN) 5-325 MG tablet Take 1 tablet by mouth every 6 (six) hours as needed for severe pain. 10 tablet Ok Edwards, PA-C     Controlled Substance Prescriptions [place] Controlled Substance Registry consulted? Yes, I have consulted the [place] Controlled Substances Registry for this patient, and feel the risk/benefit ratio today is favorable for proceeding with this prescription for a controlled substance.   Ok Edwards, PA-C 07/27/18 1601

## 2018-07-27 NOTE — ED Notes (Signed)
Patient able to ambulate independently  

## 2018-07-27 NOTE — ED Triage Notes (Signed)
Pt presents to [facility] for assessment of right ankle pain after stepping into a hole in the grass.  Patient c/o a "snap" during injury.  Swelling noted.

## 2018-07-27 NOTE — Discharge Instructions (Signed)
As discussed, xray shows fracture to the outside of your ankle. Continue tylenol 500-650mg  three times a day, add norco as needed for further pain relief. Norco contains 325mg  of tylenol per pill. Do not exceed 1000mg  of tylenol every 8 hours. Ice compress, rest, elevation. Cam walker applied. Remain nonweightbearing until cleared by orthopedics. Follow up with orthopedics for further evaluation and management needed.

## 2018-07-31 ENCOUNTER — Telehealth: Payer: Self-pay | Admitting: Orthopedic Surgery

## 2018-07-31 NOTE — Telephone Encounter (Signed)
Patient son called left vm wanting to make appt for patient. Called pt back no answer. No machine to leave message on. Phone continuously rang

## 2018-08-01 ENCOUNTER — Ambulatory Visit: Payer: Medicare Other | Admitting: Orthopedic Surgery

## 2018-08-01 DIAGNOSIS — F17200 Nicotine dependence, unspecified, uncomplicated: Secondary | ICD-10-CM | POA: Diagnosis not present

## 2018-08-01 DIAGNOSIS — M25571 Pain in right ankle and joints of right foot: Secondary | ICD-10-CM | POA: Diagnosis not present

## 2018-08-01 DIAGNOSIS — S8261XA Displaced fracture of lateral malleolus of right fibula, initial encounter for closed fracture: Secondary | ICD-10-CM | POA: Diagnosis not present

## 2018-08-22 DIAGNOSIS — M25571 Pain in right ankle and joints of right foot: Secondary | ICD-10-CM | POA: Diagnosis not present

## 2018-08-22 DIAGNOSIS — F17200 Nicotine dependence, unspecified, uncomplicated: Secondary | ICD-10-CM | POA: Diagnosis not present

## 2018-08-22 DIAGNOSIS — S8261XD Displaced fracture of lateral malleolus of right fibula, subsequent encounter for closed fracture with routine healing: Secondary | ICD-10-CM | POA: Diagnosis not present

## 2018-09-26 DIAGNOSIS — M25571 Pain in right ankle and joints of right foot: Secondary | ICD-10-CM | POA: Diagnosis not present

## 2018-09-26 DIAGNOSIS — S8264XD Nondisplaced fracture of lateral malleolus of right fibula, subsequent encounter for closed fracture with routine healing: Secondary | ICD-10-CM | POA: Diagnosis not present

## 2018-10-07 ENCOUNTER — Other Ambulatory Visit: Payer: Self-pay | Admitting: Cardiology

## 2018-10-08 ENCOUNTER — Other Ambulatory Visit: Payer: Medicare Other

## 2018-10-08 ENCOUNTER — Encounter

## 2018-10-11 ENCOUNTER — Ambulatory Visit: Payer: Medicare Other | Admitting: Endocrinology

## 2018-10-24 DIAGNOSIS — S8261XD Displaced fracture of lateral malleolus of right fibula, subsequent encounter for closed fracture with routine healing: Secondary | ICD-10-CM | POA: Diagnosis not present

## 2018-10-24 DIAGNOSIS — M25571 Pain in right ankle and joints of right foot: Secondary | ICD-10-CM | POA: Diagnosis not present

## 2018-10-24 DIAGNOSIS — F17200 Nicotine dependence, unspecified, uncomplicated: Secondary | ICD-10-CM | POA: Diagnosis not present

## 2018-11-02 ENCOUNTER — Other Ambulatory Visit: Payer: Self-pay | Admitting: Cardiology

## 2018-11-04 ENCOUNTER — Other Ambulatory Visit: Payer: Self-pay | Admitting: Endocrinology

## 2018-11-04 DIAGNOSIS — E890 Postprocedural hypothyroidism: Secondary | ICD-10-CM

## 2018-11-05 ENCOUNTER — Other Ambulatory Visit: Payer: Medicare Other

## 2018-11-07 ENCOUNTER — Ambulatory Visit: Payer: Medicare Other | Admitting: Endocrinology

## 2018-11-12 ENCOUNTER — Other Ambulatory Visit: Payer: Self-pay | Admitting: Cardiology

## 2018-11-12 ENCOUNTER — Other Ambulatory Visit: Payer: Self-pay | Admitting: Endocrinology

## 2018-11-23 ENCOUNTER — Other Ambulatory Visit: Payer: Self-pay

## 2018-11-23 ENCOUNTER — Telehealth: Payer: Self-pay | Admitting: Cardiology

## 2018-11-23 MED ORDER — METOPROLOL TARTRATE 25 MG PO TABS: 37.5000 mg | ORAL_TABLET | Freq: Two times a day (BID) | ORAL | 0 refills | Status: DC

## 2018-11-23 NOTE — Telephone Encounter (Signed)
°*  STAT* If patient is at the pharmacy, call can be transferred to refill team.   1. Which medications need to be refilled? (please list name of each medication and dose if known) METOPROLOL  25 mg  2. Which pharmacy/location (including street and city if local pharmacy) is medication to be sent to?  [facility]   3. Do they need a 30 day or 90 day supply? [place]

## 2018-12-05 ENCOUNTER — Other Ambulatory Visit (INDEPENDENT_AMBULATORY_CARE_PROVIDER_SITE_OTHER): Payer: Medicare Other

## 2018-12-05 ENCOUNTER — Other Ambulatory Visit: Payer: Self-pay

## 2018-12-05 DIAGNOSIS — E890 Postprocedural hypothyroidism: Secondary | ICD-10-CM | POA: Diagnosis not present

## 2018-12-06 LAB — TSH: TSH: 1.99 u[IU]/mL (ref 0.35–4.50)

## 2018-12-06 LAB — T4, FREE: Free T4: 1.19 ng/dL (ref 0.60–1.60)

## 2018-12-11 ENCOUNTER — Ambulatory Visit (INDEPENDENT_AMBULATORY_CARE_PROVIDER_SITE_OTHER): Payer: Medicare Other | Admitting: Endocrinology

## 2018-12-11 ENCOUNTER — Encounter: Payer: Self-pay | Admitting: Endocrinology

## 2018-12-11 ENCOUNTER — Other Ambulatory Visit: Payer: Self-pay

## 2018-12-11 DIAGNOSIS — E890 Postprocedural hypothyroidism: Secondary | ICD-10-CM | POA: Diagnosis not present

## 2018-12-11 MED ORDER — LEVOTHYROXINE SODIUM 50 MCG PO TABS: ORAL_TABLET | ORAL | 0 refills | Status: DC

## 2018-12-11 NOTE — Progress Notes (Signed)
Patient ID: Dominique Barber, female   DOB: 06/24/1955, 63 y.o.   MRN: [id number]  Today's office visit was provided via telemedicine using a telephone call to the patient Patient has been explained the limitations of evaluation and management by telemedicine and the availability of in person appointments.  The patient understood the limitations and agreed to proceed. Patient also understood that the telehealth visit is billable.  Location of the patient: Home  Location of the provider: Office Only the patient and myself were participating in the encounter  Reason for Appointment: Thyroid follow-up    History of Present Illness:   Her hypothyroidism was first diagnosed in 1999 after radioactive iodine treatment for toxic nodular goiter Subsequently she became hypothyroid and has been on thyroid supplementation  She has been on the same dose about 11/2014 when her TSH was lower than usual at 0.13 Her dose was reduced from 100 mcg down to 75 g     Subsequently TSH has been normal consistently  Her last visit was over a year ago Recently has not had any complaints of feeling unusually tired, hot or cold or any palpitations  Not clear if she has had any weight change            The patient is taking the levothyroxine supplement daily in the morning before breakfast and has not missed any doses Because of fear of allergy from dyes she is taking 50 mcg tablet and using 1-1/2 daily  Not taking any calcium or iron -containing supplements at the same time in the morning   Lab Results  Component Value Date   TSH 1.99 12/05/2018   TSH 1.49 10/06/2017   TSH 2.24 08/17/2017   FREET4 1.19 12/05/2018   FREET4 1.21 10/06/2017   FREET4 1.34 08/17/2017   Wt Readings from Last 3 Encounters:  05/02/18 137 lb (62.1 kg)  10/25/17 138 lb (62.6 kg)  10/10/17 137 lb (62.1 kg)     GOITER:   She has had a multinodular goiter since her 81s. Details of this are also not available  at present. She recalls that she has had a benign biopsy in the past   She does not feel any difficulty swallowing or local pressure in the neck In 10/2017 she had stable palpable nodules, 1 on each side   Allergies as of 12/11/2018      Reactions   Atorvastatin Other (See Comments)   Gas pain in abdomen with no relief   Iohexol Hives, Rash   Red dye   Viberzi [eluxadoline] Nausea And Vomiting      Medication List       Accurate as of December 11, 2018  3:42 PM. If you have any questions, ask your nurse or doctor.        albuterol 108 (90 Base) MCG/ACT inhaler Commonly known as: VENTOLIN HFA Inhale 1-2 puffs into the lungs every 6 (six) hours as needed for wheezing or shortness of breath.   ALIGN PO Take by mouth daily.   CALCIUM PO Take 1 tablet by mouth daily.   cetirizine 10 MG tablet Commonly known as: ZYRTEC TAKE 1 TABLET DAILY   cholestyramine 4 g packet Commonly known as: QUESTRAN Take 1 packet by mouth daily.   HYDROcodone-acetaminophen 5-325 MG tablet Commonly known as: NORCO/VICODIN Take 1 tablet by mouth every 6 (six) hours as needed for severe pain.   hyoscyamine 0.125 MG tablet Commonly known as: LEVSIN Take 0.125 mg by mouth 2 (two) times  daily.   levothyroxine 50 MCG tablet Commonly known as: SYNTHROID TAKE ONE AND ONE-HALF (1 & 1/2) TABLETS DAILY   metoprolol tartrate 25 MG tablet Commonly known as: LOPRESSOR Take 1.5 tablets (37.5 mg total) by mouth 2 (two) times daily. *NEEDS OFFICE VISIT FOR FURTHER REFILLS*   nitroGLYCERIN 0.4 MG SL tablet Commonly known as: NITROSTAT Place 1 tablet (0.4 mg total) under the tongue every 5 (five) minutes as needed for chest pain.   potassium chloride 10 MEQ tablet Commonly known as: KLOR-CON TAKE AS INSTRUCTED BY YOUR PRESCRIBER   rosuvastatin 20 MG tablet Commonly known as: CRESTOR TAKE 1 TABLET DAILY   TUSSIN PO Take 30 mLs by mouth every 4 (four) hours as needed. For cough   varenicline 1 MG  tablet Commonly known as: Chantix Continuing Month Pak Take 1 tablet (1 mg total) by mouth 2 (two) times daily.   varenicline 0.5 MG X 11 & 1 MG X 42 tablet Commonly known as: Chantix Starting Month Pak Take  0.5 mg tablet by mouth once daily for 3 days, increase to  0.5 mg tablet twice daily for 4 days, increase to  1 mg tablet twice daily.   Xarelto 20 MG Tabs tablet Generic drug: rivaroxaban TAKE 1 TABLET DAILY WITH SUPPER (COMPLETE BLOOD WORK PRIOR TO NEXT REFILL AUTHORIZATION)       Allergies:  Allergies  Allergen Reactions   Atorvastatin Other (See Comments)    Gas pain in abdomen with no relief   Iohexol Hives and Rash    Red dye   Viberzi [Eluxadoline] Nausea And Vomiting    Past Medical History:  Diagnosis Date   CAD in native artery 1998   Followup 1 month post MI revealed 95% ISR in PTCA site --> PCI with 3.0 mm x 25 mm BMS stent in proximal LAD --> 5 months later recurrent ISR of LAD BMS that compromised D1 --> referred for CABG .--> Emergent redo single vessel CABG with SVG-mLAD for occluded LIMA   COPD (chronic obstructive pulmonary disease) (HCC)    GERD (gastroesophageal reflux disease)    Hyperlipidemia    Hypothyroidism    On Synthroid   Iron deficiency anemia    Irritable bowel syndrome    Followed by Dr. Juanita Craver.   PAF (paroxysmal atrial fibrillation) ([place]) 05/03/2014   New onset - originally with RVR   S/P CABG x 2 07/1996   Initially LIMA-LAD, SVG-D1 --> immediate LIMA failure post CABG with anterior MI --> urgent redo SVG-LAD beyond D2.; Widely patent grafts as of 2005 with left dominant system. Graft to the diagonal branch has a very small target vessel but the vein graft to LAD retrograde fills a large bifurcating D2 and antegrade fills a large D3.   ST elevation (STEMI) myocardial infarction involving left anterior descending coronary artery ([place]) [id number]   Complicated by VF arrest, and anoxic brain injury with status epilepticus;  POBA of LAD --> 6 months later, after 2 vessel CABG she had postop complication with occluded LIMA/second anterior STEMI   Tobacco abuse     Past Surgical History:  Procedure Laterality Date   APPENDECTOMY     CARDIAC CATHETERIZATION  07/30/1996   normal L main, LAD w/95% stenosis just before stent, diffuse 80-85% end-stent restenosis, 1st diagonal with70-75% ostial stenosis, large optional diagonal that was normal, Cfx with 2 marginals and distal PLA (all normal), RCA normal (Dr. Marella Chimes)   CARDIAC CATHETERIZATION  01/31/2003   LAD totally occluded in prox 3rd,  patent Cfx, SVG to mid LAd patent, SVG to small DX1 patent, LIMA to LAD was atretic and essentially occluded in junction of prox & mid 3rd, R barciocephalic and subclavian were normal (Dr. Marella Chimes)   [place]  01/19/1996   acute anterior wall MI, anoxic encephalopahty, VF; LCfx free of disease and dominant, RCA patent, L main short and patent, LAD with 70-80% narrowing and focal 95% stenosis in distal 3rd with balloon angioplasty (Dr. Marella Chimes)   [place]  02/26/1996   3.0x18mm Multi-Link stent to prox/mid LAD; L main normal & short, Cfx dominant and normal, PDA & PLA normal, RCA non-dominant with small PLA and normal (Dr. Marella Chimes)   CORONARY ARTERY BYPASS GRAFT  07/31/1996   x2; LIMA to LAD and SVG to 1st diagonal (Dr. Remus Loffler)   CORONARY ARTERY BYPASS GRAFT  07/31/1996   x1; SVG to distal LAD (Dr. Remus Loffler)   [place]  10/2010; 06/2014   a) LexiScan Cardiolite - mild perfusion defect r/t infarct/scar with mild periifarct ischemia in mid anterior & apical anterior region; EF 67%; abnormal, low risk study;; b) Small, moderate intensity Fixed perfusion defect - mid Anterior Wall c/w known Anterior MI. LOW RISK    TRANSTHORACIC ECHOCARDIOGRAM  02/2012; 06/2014   a) EF 50-55%, mild HK of anterospetal myocardium;  mild MR;; b) EF 55-60%, mild HK of apical anterior wall.   Mild MR    Family History  Problem Relation Age of Onset   Heart failure Other    Diabetes Other    Diabetes Mother    Thyroid disease Paternal Aunt    Thyroid disease Maternal Grandmother     Social History:  reports that she has been smoking cigarettes. She has been smoking about 0.25 packs per day. She has quit using smokeless tobacco. She reports that she does not drink alcohol. No history on file for drug.  REVIEW Of SYSTEMS:  History of hyperlipidemia: Followed by cardiologist and taking Crestor   Lab Results  Component Value Date   CHOL 129 11/24/2017   HDL 52 11/24/2017   LDLCALC 55 11/24/2017   TRIG 112 11/24/2017   CHOLHDL 2.5 11/24/2017   She continues to be on Xarelto for PAF  She does not have a PCP yet   Examination:   There were no vitals taken for this visit.  No exam done, virtual visit    Assessment    MULTINODULAR goiter: Long-standing  She was not examined today  We will have her come back in about 3 months for clinical exam  Hypothyroidism, post ablative secondary to previous I-131 treatment several years ago She has been on a consistent dose of 75 mcg levothyroxine since about 2016 Subjectively doing well  TSH normal again     Treatment:  Continue 75 g levothyroxine daily She was offered taking a single tablet of 75 mcg but she still prefers to use a white tablet of the 50 mcg and take 1-1/2 because of fear of allergy  New prescription sent  Duration of telephone encounter =6 minutes   Elayne Snare 12/11/2018, 3:42 PM

## 2018-12-14 ENCOUNTER — Telehealth: Payer: Self-pay

## 2018-12-14 MED ORDER — LEVOTHYROXINE SODIUM 50 MCG PO TABS: ORAL_TABLET | ORAL | 0 refills | Status: DC

## 2018-12-14 NOTE — Telephone Encounter (Signed)
MEDICATION: levothyroxine (SYNTHROID) 50 MCG tablet  PHARMACY:  [facility] - [place] - [address] [place]  IS THIS A 90 DAY SUPPLY :   IS PATIENT OUT OF MEDICATION:   IF NOT; HOW MUCH IS LEFT:   LAST APPOINTMENT DATE: @12 /08/2018  NEXT APPOINTMENT DATE:@Visit  date not found  DO WE HAVE YOUR PERMISSION TO LEAVE A DETAILED MESSAGE:  OTHER COMMENTS:    **Let patient know to contact pharmacy at the end of the day to make sure medication is ready. **  ** Please notify patient to allow 48-72 hours to process**  **Encourage patient to contact the pharmacy for refills or they can request refills through [facility]**

## 2018-12-14 NOTE — Telephone Encounter (Signed)
Patient calling that she has not received her thyroid medication from the mail order and request temp supply to below listed pharmacy as she is now out of medication.

## 2018-12-17 ENCOUNTER — Other Ambulatory Visit: Payer: Self-pay

## 2018-12-17 MED ORDER — LEVOTHYROXINE SODIUM 50 MCG PO TABS: ORAL_TABLET | ORAL | 1 refills | Status: DC

## 2018-12-17 NOTE — Telephone Encounter (Signed)
Rx sent to [facility] because previous Rx was sent to local pharmacy per request below.

## 2018-12-21 ENCOUNTER — Telehealth: Payer: Self-pay | Admitting: Cardiology

## 2018-12-21 NOTE — Telephone Encounter (Signed)
Has had intermittent dizziness since last weekend.No syncope. Today she feels well, no dizziness. BP 129/59, HR 81. She ate just a donut this am and reported blood sugar of 300 mg/dl. I advised her to speak with her endocrinologist regarding this. I also encouraged her to have a form or protein with the first meal of the day.

## 2018-12-21 NOTE — Telephone Encounter (Signed)
New Message  STAT if patient feels like he/she is going to faint   1) Are you dizzy now? No  2) Do you feel faint or have you passed out? No  3) Do you have any other symptoms? Light-headedness  4) Have you checked your HR and BP (record if available)? HR 80; 100/76

## 2018-12-23 ENCOUNTER — Other Ambulatory Visit: Payer: Self-pay | Admitting: Endocrinology

## 2018-12-25 ENCOUNTER — Encounter: Payer: Self-pay | Admitting: Adult Health

## 2018-12-25 ENCOUNTER — Other Ambulatory Visit: Payer: Self-pay

## 2018-12-25 ENCOUNTER — Ambulatory Visit (INDEPENDENT_AMBULATORY_CARE_PROVIDER_SITE_OTHER): Payer: Medicare Other | Admitting: Adult Health

## 2018-12-25 VITALS — BP 132/79 | HR 68 | Ht 62.0 in | Wt 135.0 lb

## 2018-12-25 DIAGNOSIS — I2102 ST elevation (STEMI) myocardial infarction involving left anterior descending coronary artery: Secondary | ICD-10-CM | POA: Diagnosis not present

## 2018-12-25 DIAGNOSIS — E7800 Pure hypercholesterolemia, unspecified: Secondary | ICD-10-CM | POA: Diagnosis not present

## 2018-12-25 DIAGNOSIS — I480 Paroxysmal atrial fibrillation: Secondary | ICD-10-CM

## 2018-12-25 DIAGNOSIS — I10 Essential (primary) hypertension: Secondary | ICD-10-CM

## 2018-12-25 DIAGNOSIS — I2510 Atherosclerotic heart disease of native coronary artery without angina pectoris: Secondary | ICD-10-CM

## 2018-12-25 DIAGNOSIS — R1010 Upper abdominal pain, unspecified: Secondary | ICD-10-CM

## 2018-12-25 MED ORDER — RIVAROXABAN 20 MG PO TABS: 20.0000 mg | ORAL_TABLET | Freq: Every day | ORAL | 3 refills | Status: DC

## 2018-12-25 MED ORDER — ROSUVASTATIN CALCIUM 20 MG PO TABS: 20.0000 mg | ORAL_TABLET | Freq: Every day | ORAL | 3 refills | Status: DC

## 2018-12-25 MED ORDER — METOPROLOL TARTRATE 25 MG PO TABS: 37.5000 mg | ORAL_TABLET | Freq: Two times a day (BID) | ORAL | 3 refills | Status: DC

## 2018-12-25 NOTE — Patient Instructions (Signed)
Medication Instructions:  Continue current medications  If you need a refill on your cardiac medications before your next appointment, please call your pharmacy.  Labwork: None Ordered  Testing/Procedures: None Ordered  PLEASE READ AND FOLLOW SALTY 6 ATTACHED  Reduce your risk of getting COVID-19 With your heart disease it is especially important for people at increased risk of severe illness from COVID-19, and those who live with them, to protect themselves from getting COVID-19. The best way to protect yourself and to help reduce the spread of the virus that causes COVID-19 is to: Marland Kitchen Limit your interactions with other people as much as possible. . Take precautions to prevent getting COVID-19 when you do interact with others. If you start feeling sick and think you may have COVID-19, get in touch with your healthcare provider within 24 hours.  Follow-Up: IN 4 months Please call our office 2 months in advance, to schedule this appointment. In Person Glenetta Hew, MD.    At [facility], you and your health needs are our priority.  As part of our continuing mission to provide you with exceptional heart care, we have created designated Provider Care Teams.  These Care Teams include your primary Cardiologist (physician) and Advanced Practice Providers (APPs -  Physician Assistants and Nurse Practitioners) who all work together to provide you with the care you need, when you need it.  Thank you for choosing [facility] at [facility]!!     Happy Holidays!!

## 2018-12-25 NOTE — Progress Notes (Signed)
Cardiology Office Note   Date:  12/25/2018   ID:  Dominique Barber, DOB [birth year], MRN [id number]  PCP:  System, Pcp Not In  Cardiologist: Dr. Ellyn Hack Follow Up   History of Present Illness: Dominique Barber is a 63 y.o. female who presents for ongoing assessment and management of CAD, history of 100% LAD, status post SVG to LAD after failed LIMA to LAD attempt.  Hyperlipidemia, paroxysmal atrial fibrillation on Xarelto with CHADS VASC Score 3, remote history of tobacco abuse using Nicorette gum with rare cigarette on occasion.  Last seen by Dr. Ellyn Hack on 05/02/2018 and was doing well, blood pressure was well controlled, he was asymptomatic, there were no issues with bleeding on anticoagulation therapy.  He called our office on 12/21/2018 stating that he felt like he was going to pass out complaining of lightheadedne  She comes today with complaints mostly of a GI etiology with excessive gas, bloating, diarrhea and abdominal discomfort. She denies chest pain, palpitations, or dyspnea. She states that at times she feels a little lightheaded.Her husband checks her BP and blood sugar.  It is usually okay. Occasionally the blood sugar is elevated.   She is medically compliant and denies any bleeding on Xarelto.    Past Medical History:  Diagnosis Date  . CAD in native artery 1998   Followup 1 month post MI revealed 95% ISR in PTCA site --> PCI with 3.0 mm x 25 mm BMS stent in proximal LAD --> 5 months later recurrent ISR of LAD BMS that compromised D1 --> referred for CABG .--> Emergent redo single vessel CABG with SVG-mLAD for occluded LIMA  . COPD (chronic obstructive pulmonary disease) ([place])   . GERD (gastroesophageal reflux disease)   . Hyperlipidemia   . Hypothyroidism    On Synthroid  . Iron deficiency anemia   . Irritable bowel syndrome    Followed by Dr. Juanita Craver.  Marland Kitchen PAF (paroxysmal atrial fibrillation) ([place]) 05/03/2014   New onset - originally with RVR  . S/P CABG x 2  07/1996   Initially LIMA-LAD, SVG-D1 --> immediate LIMA failure post CABG with anterior MI --> urgent redo SVG-LAD beyond D2.; Widely patent grafts as of 2005 with left dominant system. Graft to the diagonal branch has a very small target vessel but the vein graft to LAD retrograde fills a large bifurcating D2 and antegrade fills a large D3.  . ST elevation (STEMI) myocardial infarction involving left anterior descending coronary artery ([place]) [id number]   Complicated by VF arrest, and anoxic brain injury with status epilepticus; POBA of LAD --> 6 months later, after 2 vessel CABG she had postop complication with occluded LIMA/second anterior STEMI  . Tobacco abuse     Past Surgical History:  Procedure Laterality Date  . APPENDECTOMY    . CARDIAC CATHETERIZATION  07/30/1996   normal L main, LAD w/95% stenosis just before stent, diffuse 80-85% end-stent restenosis, 1st diagonal with70-75% ostial stenosis, large optional diagonal that was normal, Cfx with 2 marginals and distal PLA (all normal), RCA normal (Dr. Marella Chimes)  . CARDIAC CATHETERIZATION  01/31/2003   LAD totally occluded in prox 3rd, patent Cfx, SVG to mid LAd patent, SVG to small DX1 patent, LIMA to LAD was atretic and essentially occluded in junction of prox & mid 3rd, R barciocephalic and subclavian were normal (Dr. Marella Chimes)  . CESAREAN SECTION    . CHOLECYSTECTOMY    . CORONARY ANGIOPLASTY  01/19/1996   acute anterior wall MI, anoxic encephalopahty, VF;  LCfx free of disease and dominant, RCA patent, L main short and patent, LAD with 70-80% narrowing and focal 95% stenosis in distal 3rd with balloon angioplasty (Dr. Marella Chimes)  . CORONARY ANGIOPLASTY WITH STENT PLACEMENT  02/26/1996   3.0x71mm Multi-Link stent to prox/mid LAD; L main normal & short, Cfx dominant and normal, PDA & PLA normal, RCA non-dominant with small PLA and normal (Dr. Marella Chimes)  . CORONARY ARTERY BYPASS GRAFT  07/31/1996   x2; LIMA to LAD and SVG to 1st  diagonal (Dr. Remus Loffler)  . CORONARY ARTERY BYPASS GRAFT  07/31/1996   x1; SVG to distal LAD (Dr. Remus Loffler)  . NM MYOCAR PERF WALL MOTION  10/2010; 06/2014   a) LexiScan Cardiolite - mild perfusion defect r/t infarct/scar with mild periifarct ischemia in mid anterior & apical anterior region; EF 67%; abnormal, low risk study;; b) Small, moderate intensity Fixed perfusion defect - mid Anterior Wall c/w known Anterior MI. LOW RISK   . TRANSTHORACIC ECHOCARDIOGRAM  02/2012; 06/2014   a) EF 50-55%, mild HK of anterospetal myocardium; mild MR;; b) EF 55-60%, mild HK of apical anterior wall.   Mild MR     Current Outpatient Medications  Medication Sig Dispense Refill  . albuterol (PROVENTIL HFA;VENTOLIN HFA) 108 (90 BASE) MCG/ACT inhaler Inhale 1-2 puffs into the lungs every 6 (six) hours as needed for wheezing or shortness of breath. 1 Inhaler 0  . CALCIUM PO Take 1 tablet by mouth daily.     . cetirizine (ZYRTEC) 10 MG tablet TAKE 1 TABLET DAILY 90 tablet 4  . hyoscyamine (LEVSIN, ANASPAZ) 0.125 MG tablet Take 0.125 mg by mouth 2 (two) times daily.     Marland Kitchen levothyroxine (SYNTHROID) 50 MCG tablet TAKE ONE AND ONE-HALF (1 & 1/2) TABLETS before breakfast daily 135 tablet 1  . metoprolol tartrate (LOPRESSOR) 25 MG tablet Take 1.5 tablets (37.5 mg total) by mouth 2 (two) times daily. 225 tablet 3  . potassium chloride (KLOR-CON) 10 MEQ tablet TAKE AS INSTRUCTED BY YOUR PRESCRIBER 180 tablet 3  . Probiotic Product (ALIGN PO) Take by mouth daily.    . rivaroxaban (XARELTO) 20 MG TABS tablet Take 1 tablet (20 mg total) by mouth daily with supper. 90 tablet 3  . rosuvastatin (CRESTOR) 20 MG tablet Take 1 tablet (20 mg total) by mouth daily. 90 tablet 3  . nitroGLYCERIN (NITROSTAT) 0.4 MG SL tablet Place 1 tablet (0.4 mg total) under the tongue every 5 (five) minutes as needed for chest pain. 25 tablet 1   No current facility-administered medications for this visit.    Allergies:   Atorvastatin, Iohexol, and  Viberzi [eluxadoline]    Social History:  The patient  reports that she has been smoking cigarettes. She has been smoking about 0.25 packs per day. She has quit using smokeless tobacco. She reports that she does not drink alcohol.   Family History:  The patient's family history includes Diabetes in her mother and another family member; Heart failure in an other family member; Thyroid disease in her maternal grandmother and paternal aunt.    ROS: All other systems are reviewed and negative. Unless otherwise mentioned in H&P    PHYSICAL EXAM: VS:  BP 132/79   Pulse 68   Ht 5\' 2"  (1.575 m)   Wt 135 lb (61.2 kg)   SpO2 99%   BMI 24.69 kg/m  , BMI Body mass index is 24.69 kg/m. GEN: Well nourished, well developed, in no acute distress HEENT: normal Neck:  no JVD, carotid bruits, or masses Cardiac:RRR; no murmurs, rubs, or gallops,no edema  Respiratory:  Clear to auscultation bilaterally, normal work of breathing GI: soft, nontender, nondistended, hyperactive BS MS: no deformity or atrophy Skin: warm and dry, no rash Neuro:  Strength and sensation are intact Psych: euthymic mood, full affect   EKG:  NSR with evidence of anterior Q waves. Rate of 63 bpm   Recent Labs: 12/05/2018: TSH 1.99    Lipid Panel    Component Value Date/Time   CHOL 129 11/24/2017 1126   TRIG 112 11/24/2017 1126   HDL 52 11/24/2017 1126   CHOLHDL 2.5 11/24/2017 1126   CHOLHDL 3.9 10/16/2015 1221   VLDL 31 (H) 10/16/2015 1221   LDLCALC 55 11/24/2017 1126      Wt Readings from Last 3 Encounters:  12/25/18 135 lb (61.2 kg)  05/02/18 137 lb (62.1 kg)  10/25/17 138 lb (62.6 kg)      Other studies Reviewed: Echocardiogram 07-15-2014  Left ventricle: The cavity size was normal. Systolic function was   normal. The estimated ejection fraction was in the range of 55%   to 60%. There is mild hypokinesis of the apicalanterior   myocardium. - Aortic valve: Trileaflet; mildly thickened, mildly  calcified   leaflets. - Mitral valve: There was mild regurgitation.   ASSESSMENT AND PLAN:  1. CAD: Hx of CABG with LIMA to LAD. She is without cardiac complaints but is concerned that her abdominal discomfort and excessive bloating may affect her cardiac status. For now will not planned any cardiac testing until full GI work up is completed.  2. PAF: Continues on metoprolol and Xarelto. No active bleeding, melena, or epistaxis. Refills on metoprolol and Xarelto are provided.   3. Hyperlipidemia. Continue rosuvastatin. Goal of LDL < 70.   4. Abdominal pain: Persistent bloating and gas pain.  Diarrhea. She is to follow up with GI for further assessment and recommended testing.   Current medicines are reviewed at length with the patient today.    Labs/ tests ordered today include:  Phill Myron. [place] Pugh, ANP, AACC   12/25/2018 4:05 PM    [facility] [place] [address] Office [phone] Fax [phone]  Notice: This dictation was prepared with Dragon dictation along with smaller phrase technology. Any transcriptional errors that result from this process are unintentional and may not be corrected upon review.

## 2019-01-14 ENCOUNTER — Telehealth: Payer: Self-pay

## 2019-01-14 ENCOUNTER — Other Ambulatory Visit: Payer: Self-pay

## 2019-01-14 MED ORDER — LEVOTHYROXINE SODIUM 50 MCG PO TABS: ORAL_TABLET | ORAL | 1 refills | Status: DC

## 2019-01-14 NOTE — Telephone Encounter (Signed)
Rx sent 

## 2019-01-14 NOTE — Telephone Encounter (Signed)
MEDICATION: levothyroxine (SYNTHROID) 50 MCG tablet  PHARMACY:  [facility] - [place] - [address] [address]  IS THIS A 90 DAY SUPPLY :   IS PATIENT OUT OF MEDICATION:   IF NOT; HOW MUCH IS LEFT:   LAST APPOINTMENT DATE: @12 /20/2020  NEXT APPOINTMENT DATE:@Visit  date not found  DO WE HAVE YOUR PERMISSION TO LEAVE A DETAILED MESSAGE:  OTHER COMMENTS:    **Let patient know to contact pharmacy at the end of the day to make sure medication is ready. **  ** Please notify patient to allow 48-72 hours to process**  **Encourage patient to contact the pharmacy for refills or they can request refills through [facility]**

## 2019-01-28 ENCOUNTER — Telehealth: Payer: Self-pay | Admitting: Endocrinology

## 2019-01-28 NOTE — Telephone Encounter (Signed)
Called pt and gave her MD message. Pt verbalized understanding. 

## 2019-01-28 NOTE — Telephone Encounter (Signed)
Needs to take OTC or get from PCP.  Also needs to be seen in follow-up in about 3 months with labs in the office

## 2019-01-28 NOTE — Telephone Encounter (Signed)
MEDICATION: Cetirizine 10 MG  PHARMACY:  [facility]   IS THIS A 90 DAY SUPPLY : YES  IS PATIENT OUT OF MEDICATION: no   IF NOT; HOW MUCH IS LEFT: 3 pills left   LAST APPOINTMENT DATE: @1 /11/2019  NEXT APPOINTMENT DATE:@Visit  date not found  DO WE HAVE YOUR PERMISSION TO LEAVE A DETAILED MESSAGE: yes  OTHER COMMENTS:    **Let patient know to contact pharmacy at the end of the day to make sure medication is ready. **  ** Please notify patient to allow 48-72 hours to process**  **Encourage patient to contact the pharmacy for refills or they can request refills through [facility]**

## 2019-01-28 NOTE — Telephone Encounter (Signed)
Do you want to refill or have pt obtain OTC?

## 2019-02-05 ENCOUNTER — Telehealth: Payer: Self-pay

## 2019-02-05 NOTE — Telephone Encounter (Signed)
Called and left patient voicemail for phone visit tomorrow 02/06/2019

## 2019-02-06 ENCOUNTER — Ambulatory Visit (INDEPENDENT_AMBULATORY_CARE_PROVIDER_SITE_OTHER): Payer: Medicare Other | Admitting: Internal Medicine

## 2019-02-06 ENCOUNTER — Encounter: Payer: Self-pay | Admitting: Internal Medicine

## 2019-02-06 ENCOUNTER — Other Ambulatory Visit: Payer: Self-pay

## 2019-02-06 VITALS — BP 121/78 | HR 71 | Temp 97.2°F | Resp 17 | Ht 61.5 in | Wt 132.4 lb

## 2019-02-06 DIAGNOSIS — Z114 Encounter for screening for human immunodeficiency virus [HIV]: Secondary | ICD-10-CM

## 2019-02-06 DIAGNOSIS — Z1159 Encounter for screening for other viral diseases: Secondary | ICD-10-CM

## 2019-02-06 DIAGNOSIS — Z1231 Encounter for screening mammogram for malignant neoplasm of breast: Secondary | ICD-10-CM

## 2019-02-06 DIAGNOSIS — Z7689 Persons encountering health services in other specified circumstances: Secondary | ICD-10-CM | POA: Diagnosis not present

## 2019-02-06 DIAGNOSIS — I2510 Atherosclerotic heart disease of native coronary artery without angina pectoris: Secondary | ICD-10-CM | POA: Diagnosis not present

## 2019-02-06 DIAGNOSIS — E785 Hyperlipidemia, unspecified: Secondary | ICD-10-CM

## 2019-02-06 DIAGNOSIS — M654 Radial styloid tenosynovitis [de Quervain]: Secondary | ICD-10-CM | POA: Diagnosis not present

## 2019-02-06 DIAGNOSIS — Z1211 Encounter for screening for malignant neoplasm of colon: Secondary | ICD-10-CM

## 2019-02-06 MED ORDER — MISC. DEVICES MISC: 0 refills | Status: DC

## 2019-02-06 NOTE — Patient Instructions (Addendum)
Thank you for choosing Primary Care at [facility] to be your medical home!    Dominique Barber was seen by Melina Schools, DO today.   [name]'s primary care provider is Phill Myron, DO.   For the best care possible, you should try to see Phill Myron, DO whenever you come to the clinic.   We look forward to seeing you again soon!  If you have any questions about your visit today, please call us at [phone] or feel free to reach your primary care provider via [place].    De Quervain's Tenosynovitis  De Quervain's tenosynovitis is a condition that causes inflammation of the tendon on the thumb side of the wrist. Tendons are cords of tissue that connect bones to muscles. The tendons in the hand pass through a tunnel called a sheath. A slippery layer of tissue (synovium) lets the tendons move smoothly in the sheath. With de Quervain's tenosynovitis, the sheath swells or thickens, causing friction and pain. The condition is also called de Quervain's disease and de Quervain's syndrome. It occurs most often in women who are 35-67 years old. What are the causes? The exact cause of this condition is not known. It may be associated with overuse of the hand and wrist. What increases the risk? You are more likely to develop this condition if you:  Use your hands far more than normal, especially if you repeat certain movements that involve twisting your hand or using a tight grip.  Are pregnant.  Are a middle-aged woman.  Have rheumatoid arthritis.  Have diabetes. What are the signs or symptoms? The main symptom of this condition is pain on the thumb side of the wrist. The pain may get worse when you grasp something or turn your wrist. Other symptoms may include:  Pain that extends up the forearm.  Swelling of your wrist and hand.  Trouble moving the thumb and wrist.  A sensation of snapping in the wrist.  A bump filled with fluid (cyst) in the area of  the pain. How is this diagnosed? This condition may be diagnosed based on:  Your symptoms and medical history.  A physical exam. During the exam, your health care provider may do a simple test Wynn Maudlin test) that involves pulling your thumb and wrist to see if this causes pain. You may also need to have an X-ray. How is this treated? Treatment for this condition may include:  Avoiding any activity that causes pain and swelling.  Taking medicines. Anti-inflammatory medicines and corticosteroid injections may be used to reduce inflammation and relieve pain.  Wearing a splint.  Having surgery. This may be needed if other treatments do not work. Once the pain and swelling has gone down:  Physical therapy. This includes stretching and strengthening exercises.  Occupational therapy. This includes adjusting how you move your wrist. Follow these instructions at home: If you have a splint:  Wear the splint as told by your health care provider. Remove it only as told by your health care provider.  Loosen the splint if your fingers tingle, become numb, or turn cold and blue.  Keep the splint clean.  If the splint is not waterproof: ? Do not let it get wet. ? Cover it with a watertight covering when you take a bath or a shower. Managing pain, stiffness, and swelling   Avoid movements and activities that cause pain and swelling in the wrist area.  If directed, put ice on the painful area. This  may be helpful after doing activities that involve the sore wrist. ? Put ice in a plastic bag. ? Place a towel between your skin and the bag. ? Leave the ice on for 20 minutes, 2-3 times a day.  Move your fingers often to avoid stiffness and to lessen swelling.  Raise (elevate) the injured area above the level of your heart while you are sitting or lying down. General instructions  Return to your normal activities as told by your health care provider. Ask your health care provider what  activities are safe for you.  Take over-the-counter and prescription medicines only as told by your health care provider.  Keep all follow-up visits as told by your health care provider. This is important. Contact a health care provider if:  Your pain medicine does not help.  Your pain gets worse.  You develop new symptoms. Summary  De Quervain's tenosynovitis is a condition that causes inflammation of the tendon on the thumb side of the wrist.  The condition occurs most often in women who are 16-54 years old.  The exact cause of this condition is not known. It may be associated with overuse of the hand and wrist.  Treatment starts with avoiding activity that causes pain or swelling in the wrist area. Other treatment may include wearing a splint and taking medicine. Sometimes, surgery is needed. This information is not intended to replace advice given to you by your health care provider. Make sure you discuss any questions you have with your health care provider. Document Revised: 06/22/2017 Document Reviewed: 11/28/2016 Elsevier Patient Education  2020 [employer].

## 2019-02-06 NOTE — Progress Notes (Signed)
Subjective:    Dominique Barber - 64 y.o. female MRN [id number]  Date of birth: 20-Mar-1955  HPI  Dominique Barber is to establish care. Patient has a PMH significant for h/o MI, CAD, paroxysmal Afib on Xarelto, HLD, Hypothyroidism, COPD.   Right Wrist Pain: Started a few weeks ago. Located over the lateral aspect and on top of her wrist. No recent injuries. Did fall back in august and caught herself with an outstretched hand but didn't feel any pain after that fall. Pain with wrist flexion and movement. Pain occurs in morning when wakes. Crotches and writes a lot--right handed. Has taken Tylenol for pain; has helped with that. Has numbness and tingling in all fingers.   ROS per HPI    Health Maintenance Due  Topic Date Due  . Hepatitis C Screening  September 30, 1955  . HIV Screening  07/08/1970  . PAP SMEAR-Modifier  07/07/1976  . COLONOSCOPY  07/07/2005  . MAMMOGRAM  07/16/2010     Past Medical History: Patient Active Problem List   Diagnosis Date Noted  . Paroxysmal atrial fibrillation ([place]); CHA2DS2-VASc Score 3. On Xarelto 05/04/2014  . COPD (chronic obstructive pulmonary disease) ([place]) 05/04/2014  . Hyperlipidemia with target LDL less than 70   . Tobacco abuse   . Hypothyroid 12/03/2012  . Multinodular goiter 12/03/2012  . CAD- 100% LAD, s/p SVG-LAD after failed LIMA-LAD 11/14/1996    Class: Chronic  . S/P CABG x 2 07/03/1996  . ST elevation (STEMI) myocardial infarction involving left anterior descending coronary artery ([place]) 01/19/1996      Social History   reports that she has been smoking cigarettes. She has been smoking about 0.25 packs per day. She has quit using smokeless tobacco. She reports that she does not drink alcohol.   Family History  family history includes Diabetes in her mother and another family member; Heart failure in an other family member; Thyroid disease in her maternal grandmother and paternal aunt.   Medications: reviewed and updated    Objective:   Physical Exam BP 121/78   Pulse 71   Temp (!) 97.2 F (36.2 C) (Temporal)   Resp 17   Ht 5' 1.5" (1.562 m)   Wt 132 lb 6.4 oz (60.1 kg)   SpO2 95%   BMI 24.61 kg/m  Physical Exam  Constitutional: She is oriented to person, place, and time and well-developed, well-nourished, and in no distress. No distress.  HENT:  Head: Normocephalic and atraumatic.  Eyes: Conjunctivae and EOM are normal.  Cardiovascular: Normal rate, regular rhythm and normal heart sounds.  No murmur heard. Pulmonary/Chest: Effort normal and breath sounds normal. No respiratory distress.  Musculoskeletal:        General: Normal range of motion.     Comments: Right wrist: No erythema, rashes, edema, increased warmth, or bony deformity. Full ROM. Pain with medial deviation/adduction at wrist. Negative Tinel's and Phalen's. Positive Finkelstein's test.   Neurological: She is alert and oriented to person, place, and time.  Skin: Skin is warm and dry. She is not diaphoretic.  Psychiatric: Affect and judgment normal.        Assessment & Plan:    1. Encounter to establish care Needs to return for annual exam with PAP.   2. Need for hepatitis C screening test - Hepatitis C antibody  3. Screening for HIV (human immunodeficiency virus) - HIV Antibody (routine testing w rflx)  4. Screening for colon cancer Patient reports had colonoscopy done with Dr. Collene Mares in 2020. Release  of records done.   5. Encounter for screening mammogram for malignant neoplasm of breast - MM DIGITAL SCREENING BILATERAL; Future  6. Atherosclerosis of native coronary artery of native heart without angina pectoris BP stable today. No cardiac symptoms.  - Comprehensive metabolic panel  7. Hyperlipidemia with target LDL less than 70 On statin therapy.  - Lipid panel  8. De Quervain's disease (tenosynovitis) Exam consistent with [name]. Rx given for thumb spica splint. Advised avoidance of NSAIDs due to cardiac  disease. Continue tylenol. Advised trying ice to the area. If no improvement, would refer to [facility] for possible injection. Would also likely benefit from PT/OT in the future.       Phill Myron, D.O. 02/06/2019, 3:08 PM Primary Care at [facility]

## 2019-02-07 ENCOUNTER — Other Ambulatory Visit: Payer: Self-pay | Admitting: Internal Medicine

## 2019-02-07 DIAGNOSIS — R7989 Other specified abnormal findings of blood chemistry: Secondary | ICD-10-CM

## 2019-02-07 LAB — COMPREHENSIVE METABOLIC PANEL
ALT: 20 IU/L (ref 0–32)
AST: 17 IU/L (ref 0–40)
Albumin/Globulin Ratio: 1.8 (ref 1.2–2.2)
Albumin: 4.2 g/dL (ref 3.8–4.8)
Alkaline Phosphatase: 92 IU/L (ref 39–117)
BUN/Creatinine Ratio: 11 — ABNORMAL LOW (ref 12–28)
BUN: 12 mg/dL (ref 8–27)
Bilirubin Total: 0.4 mg/dL (ref 0.0–1.2)
CO2: 24 mmol/L (ref 20–29)
Calcium: 9.6 mg/dL (ref 8.7–10.3)
Chloride: 103 mmol/L (ref 96–106)
Creatinine, Ser: 1.11 mg/dL — ABNORMAL HIGH (ref 0.57–1.00)
GFR calc Af Amer: 61 mL/min/{1.73_m2} (ref >59)
GFR calc non Af Amer: 53 mL/min/{1.73_m2} — ABNORMAL LOW (ref >59)
Globulin, Total: 2.3 g/dL (ref 1.5–4.5)
Glucose: 101 mg/dL — ABNORMAL HIGH (ref 65–99)
Potassium: 4.3 mmol/L (ref 3.5–5.2)
Sodium: 140 mmol/L (ref 134–144)
Total Protein: 6.5 g/dL (ref 6.0–8.5)

## 2019-02-07 LAB — LIPID PANEL
Chol/HDL Ratio: 2.3 ratio (ref 0.0–4.4)
Cholesterol, Total: 119 mg/dL (ref 100–199)
HDL: 52 mg/dL (ref >39)
LDL Chol Calc (NIH): 46 mg/dL (ref 0–99)
Triglycerides: 119 mg/dL (ref 0–149)
VLDL Cholesterol Cal: 21 mg/dL (ref 5–40)

## 2019-02-07 LAB — HEPATITIS C ANTIBODY: Hep C Virus Ab: <0.1 s/co ratio (ref 0.0–0.9)

## 2019-02-07 LAB — HIV ANTIBODY (ROUTINE TESTING W REFLEX): HIV Screen 4th Generation wRfx: NONREACTIVE

## 2019-02-19 NOTE — Progress Notes (Signed)
Patient notified of results & recommendations. Expressed understanding.

## 2019-02-27 ENCOUNTER — Ambulatory Visit: Payer: Medicare Other | Admitting: Internal Medicine

## 2019-03-19 ENCOUNTER — Telehealth: Payer: Self-pay

## 2019-03-19 NOTE — Telephone Encounter (Signed)

## 2019-03-19 NOTE — Patient Instructions (Signed)

## 2019-03-20 ENCOUNTER — Ambulatory Visit (INDEPENDENT_AMBULATORY_CARE_PROVIDER_SITE_OTHER): Payer: Medicare Other | Admitting: Nurse Practitioner

## 2019-03-20 ENCOUNTER — Other Ambulatory Visit (HOSPITAL_COMMUNITY)
Admission: RE | Admit: 2019-03-20 | Discharge: 2019-03-20 | Disposition: A | Discharge status: Home / Self Care | Payer: Medicare Other | Source: Ambulatory Visit | Attending: Nurse Practitioner | Admitting: Nurse Practitioner

## 2019-03-20 VITALS — BP 132/78 | HR 66 | Temp 97.2°F | Resp 17 | Wt 132.0 lb

## 2019-03-20 DIAGNOSIS — Z124 Encounter for screening for malignant neoplasm of cervix: Secondary | ICD-10-CM | POA: Diagnosis not present

## 2019-03-20 DIAGNOSIS — Z1151 Encounter for screening for human papillomavirus (HPV): Secondary | ICD-10-CM | POA: Insufficient documentation

## 2019-03-20 DIAGNOSIS — R7989 Other specified abnormal findings of blood chemistry: Secondary | ICD-10-CM | POA: Diagnosis not present

## 2019-03-20 DIAGNOSIS — Z0000 Encounter for general adult medical examination without abnormal findings: Secondary | ICD-10-CM | POA: Diagnosis not present

## 2019-03-20 DIAGNOSIS — Z780 Asymptomatic menopausal state: Secondary | ICD-10-CM | POA: Insufficient documentation

## 2019-03-20 DIAGNOSIS — D649 Anemia, unspecified: Secondary | ICD-10-CM

## 2019-03-20 NOTE — Progress Notes (Signed)
Assessment & Plan:  Dominique Barber was seen today for annual exam.  Diagnoses and all orders for this visit:  Encounter for annual wellness visit (AWV) in Medicare patient  Pap smear for cervical cancer screening -     Cytology - PAP([facility]) -     Cervicovaginal ancillary only  Anemia, unspecified type -     CBC  Elevated serum creatinine -     Basic metabolic panel    Patient has been counseled on age-appropriate routine health concerns for screening and prevention. These are reviewed and up-to-date. Referrals have been placed accordingly. Immunizations are up-to-date or declined.    Subjective:   Chief Complaint  Patient presents with  . Annual Exam    nonfasting. mammo is scheduled 04/02/19   HPI Dominique Barber 64 y.o. female presents to office today for wellness exam and PAP.   Review of Systems  Constitutional: Negative for fever, malaise/fatigue and weight loss.  HENT: Negative.  Negative for nosebleeds.   Eyes: Negative.  Negative for blurred vision, double vision and photophobia.  Respiratory: Negative.  Negative for cough and shortness of breath.   Cardiovascular: Negative.  Negative for chest pain, palpitations and leg swelling.  Gastrointestinal: Positive for constipation and diarrhea. Negative for heartburn, nausea and vomiting.  Genitourinary: Negative.   Musculoskeletal: Negative.  Negative for myalgias.  Skin: Negative.   Neurological: Negative.  Negative for dizziness, focal weakness, seizures and headaches.  Endo/Heme/Allergies: Negative.   Psychiatric/Behavioral: Negative.  Negative for suicidal ideas.    Past Medical History:  Diagnosis Date  . CAD in native artery 1998   Followup 1 month post MI revealed 95% ISR in PTCA site --> PCI with 3.0 mm x 25 mm BMS stent in proximal LAD --> 5 months later recurrent ISR of LAD BMS that compromised D1 --> referred for CABG .--> Emergent redo single vessel CABG with SVG-mLAD for occluded LIMA  . COPD  (chronic obstructive pulmonary disease) ([place])   . GERD (gastroesophageal reflux disease)   . Hyperlipidemia   . Hypothyroidism    On Synthroid  . Iron deficiency anemia   . Irritable bowel syndrome    Followed by Dr. Juanita Craver.  Marland Kitchen PAF (paroxysmal atrial fibrillation) ([place]) 05/03/2014   New onset - originally with RVR  . S/P CABG x 2 07/1996   Initially LIMA-LAD, SVG-D1 --> immediate LIMA failure post CABG with anterior MI --> urgent redo SVG-LAD beyond D2.; Widely patent grafts as of 2005 with left dominant system. Graft to the diagonal branch has a very small target vessel but the vein graft to LAD retrograde fills a large bifurcating D2 and antegrade fills a large D3.  . ST elevation (STEMI) myocardial infarction involving left anterior descending coronary artery ([place]) 123456   Complicated by VF arrest, and anoxic brain injury with status epilepticus; POBA of LAD --> 6 months later, after 2 vessel CABG she had postop complication with occluded LIMA/second anterior STEMI  . Tobacco abuse     Past Surgical History:  Procedure Laterality Date  . APPENDECTOMY    . CARDIAC CATHETERIZATION  07/30/1996   normal L main, LAD w/95% stenosis just before stent, diffuse 80-85% end-stent restenosis, 1st diagonal with70-75% ostial stenosis, large optional diagonal that was normal, Cfx with 2 marginals and distal PLA (all normal), RCA normal (Dr. Marella Chimes)  . CARDIAC CATHETERIZATION  01/31/2003   LAD totally occluded in prox 3rd, patent Cfx, SVG to mid LAd patent, SVG to small DX1 patent, LIMA to  LAD was atretic and essentially occluded in junction of prox & mid 3rd, R barciocephalic and subclavian were normal (Dr. Marella Chimes)  . CESAREAN SECTION    . CHOLECYSTECTOMY    . CORONARY ANGIOPLASTY  01/19/1996   acute anterior wall MI, anoxic encephalopahty, VF; LCfx free of disease and dominant, RCA patent, L main short and patent, LAD with 70-80% narrowing and focal 95% stenosis in distal 3rd with  balloon angioplasty (Dr. Marella Chimes)  . CORONARY ANGIOPLASTY WITH STENT PLACEMENT  02/26/1996   3.0x25mm Multi-Link stent to prox/mid LAD; L main normal & short, Cfx dominant and normal, PDA & PLA normal, RCA non-dominant with small PLA and normal (Dr. Marella Chimes)  . CORONARY ARTERY BYPASS GRAFT  07/31/1996   x2; LIMA to LAD and SVG to 1st diagonal (Dr. Remus Loffler)  . CORONARY ARTERY BYPASS GRAFT  07/31/1996   x1; SVG to distal LAD (Dr. Remus Loffler)  . NM MYOCAR PERF WALL MOTION  10/2010; 06/2014   a) LexiScan Cardiolite - mild perfusion defect r/t infarct/scar with mild periifarct ischemia in mid anterior & apical anterior region; EF 67%; abnormal, low risk study;; b) Small, moderate intensity Fixed perfusion defect - mid Anterior Wall c/w known Anterior MI. LOW RISK   . OVARIAN CYST REMOVAL    . TRANSTHORACIC ECHOCARDIOGRAM  02/2012; 06/2014   a) EF 50-55%, mild HK of anterospetal myocardium; mild MR;; b) EF 55-60%, mild HK of apical anterior wall.   Mild MR    Family History  Problem Relation Age of Onset  . Heart failure Other   . Diabetes Other   . Diabetes Mother   . Thyroid disease Paternal Aunt   . Thyroid disease Maternal Grandmother     Social History Reviewed with no changes to be made today.   Outpatient Medications Prior to Visit  Medication Sig Dispense Refill  . CALCIUM PO Take 1 tablet by mouth daily.     . cetirizine (ZYRTEC) 10 MG tablet TAKE 1 TABLET DAILY 90 tablet 4  . hyoscyamine (LEVSIN, ANASPAZ) 0.125 MG tablet Take 0.125 mg by mouth 2 (two) times daily.     Marland Kitchen levothyroxine (SYNTHROID) 50 MCG tablet TAKE ONE AND ONE-HALF (1 & 1/2) TABLETS before breakfast daily 135 tablet 1  . metoprolol tartrate (LOPRESSOR) 25 MG tablet Take 1.5 tablets (37.5 mg total) by mouth 2 (two) times daily. 225 tablet 3  . Misc. Devices MISC Dispense one right thumb spica splint to be worn nightly prn for wrist pain. 1 Device 0  . potassium chloride (KLOR-CON) 10 MEQ tablet TAKE AS INSTRUCTED  BY YOUR PRESCRIBER 180 tablet 3  . Probiotic Product (ALIGN PO) Take by mouth daily.    . rivaroxaban (XARELTO) 20 MG TABS tablet Take 1 tablet (20 mg total) by mouth daily with supper. 90 tablet 3  . rosuvastatin (CRESTOR) 20 MG tablet Take 1 tablet (20 mg total) by mouth daily. 90 tablet 3  . nitroGLYCERIN (NITROSTAT) 0.4 MG SL tablet Place 1 tablet (0.4 mg total) under the tongue every 5 (five) minutes as needed for chest pain. (Patient not taking: Reported on 02/06/2019) 25 tablet 1  . albuterol (PROVENTIL HFA;VENTOLIN HFA) 108 (90 BASE) MCG/ACT inhaler Inhale 1-2 puffs into the lungs every 6 (six) hours as needed for wheezing or shortness of breath. 1 Inhaler 0   No facility-administered medications prior to visit.    Allergies  Allergen Reactions  . Atorvastatin Other (See Comments)    Gas pain in abdomen with  no relief  . Iohexol Hives and Rash    Red dye  . Viberzi [Eluxadoline] Nausea And Vomiting       Objective:    BP 132/78   Pulse 66   Temp (!) 97.2 F (36.2 C) (Temporal)   Resp 17   Wt 132 lb (59.9 kg)   SpO2 96%   BMI 24.54 kg/m  Wt Readings from Last 3 Encounters:  03/20/19 132 lb (59.9 kg)  02/06/19 132 lb 6.4 oz (60.1 kg)  12/25/18 135 lb (61.2 kg)    Physical Exam Exam conducted with a chaperone present.  Constitutional:      General: She is not in acute distress.    Appearance: She is well-developed. She is not diaphoretic.  HENT:     Head: Normocephalic and atraumatic.     Right Ear: External ear normal.     Left Ear: External ear normal.     Nose: Nose normal.     Mouth/Throat:     Pharynx: No oropharyngeal exudate.  Eyes:     General: No scleral icterus.       Right eye: No discharge.        Left eye: No discharge.     Conjunctiva/sclera: Conjunctivae normal.     Pupils: Pupils are equal, round, and reactive to light.  Neck:     Thyroid: No thyromegaly.     Trachea: No tracheal deviation.  Cardiovascular:     Rate and Rhythm: Normal rate  and regular rhythm.     Heart sounds: Normal heart sounds. No murmur. No friction rub.  Pulmonary:     Effort: Pulmonary effort is normal. No respiratory distress.     Breath sounds: Normal breath sounds. No decreased breath sounds, wheezing, rhonchi or rales.  Chest:     Chest wall: No tenderness.     Breasts:        Right: No inverted nipple, mass, nipple discharge, skin change or tenderness.        Left: No inverted nipple, mass, nipple discharge, skin change or tenderness.  Abdominal:     General: Bowel sounds are normal. There is no distension.     Palpations: Abdomen is soft. There is no mass.     Tenderness: There is no abdominal tenderness. There is no guarding or rebound.     Hernia: There is no hernia in the left inguinal area.  Genitourinary:    Exam position: Lithotomy position.     Labia:        Right: No rash, tenderness, lesion or injury.        Left: No rash, tenderness, lesion or injury.      Vagina: Normal. No vaginal discharge, erythema, tenderness or bleeding.     Cervix: No cervical motion tenderness, discharge or friability.     Uterus: Normal. Not tender.      Adnexa: Right adnexa normal and left adnexa normal.       Right: No mass, tenderness or fullness.         Left: No mass, tenderness or fullness.       Rectum: No mass, anal fissure or external hemorrhoid. Normal anal tone.  Musculoskeletal:        General: No tenderness or deformity. Normal range of motion.     Cervical back: Normal range of motion and neck supple.  Lymphadenopathy:     Cervical: No cervical adenopathy.  Skin:    General: Skin is warm and dry.  Coloration: Skin is not pale.     Findings: No erythema or rash.  Neurological:     Mental Status: She is alert and oriented to person, place, and time.     Cranial Nerves: No cranial nerve deficit.     Coordination: Coordination normal.  Psychiatric:        Behavior: Behavior normal.        Thought Content: Thought content normal.         Judgment: Judgment normal.          Patient has been counseled extensively about nutrition and exercise as well as the importance of adherence with medications and regular follow-up. The patient was given clear instructions to go to ER or return to medical center if symptoms don't improve, worsen or new problems develop. The patient verbalized understanding.   Follow-up: Return in about 3 months (around 06/20/2019).   Gildardo Pounds, FNP-BC [facility] [place], [place]   03/20/2019, 4:22 PM

## 2019-03-21 LAB — CBC
Hematocrit: 41.9 % (ref 34.0–46.6)
Hemoglobin: 14 g/dL (ref 11.1–15.9)
MCH: 31.7 pg (ref 26.6–33.0)
MCHC: 33.4 g/dL (ref 31.5–35.7)
MCV: 95 fL (ref 79–97)
Platelets: 280 10*3/uL (ref 150–450)
RBC: 4.41 x10E6/uL (ref 3.77–5.28)
RDW: 12.5 % (ref 11.7–15.4)
WBC: 8.6 10*3/uL (ref 3.4–10.8)

## 2019-03-21 LAB — BASIC METABOLIC PANEL
BUN/Creatinine Ratio: 15 (ref 12–28)
BUN: 17 mg/dL (ref 8–27)
CO2: 21 mmol/L (ref 20–29)
Calcium: 10.3 mg/dL (ref 8.7–10.3)
Chloride: 106 mmol/L (ref 96–106)
Creatinine, Ser: 1.12 mg/dL — ABNORMAL HIGH (ref 0.57–1.00)
GFR calc Af Amer: 60 mL/min/{1.73_m2} (ref >59)
GFR calc non Af Amer: 52 mL/min/{1.73_m2} — ABNORMAL LOW (ref >59)
Glucose: 83 mg/dL (ref 65–99)
Potassium: 4.3 mmol/L (ref 3.5–5.2)
Sodium: 143 mmol/L (ref 134–144)

## 2019-03-21 LAB — CYTOLOGY - PAP
Comment: NEGATIVE
Diagnosis: NEGATIVE
High risk HPV: NEGATIVE

## 2019-03-21 LAB — CERVICOVAGINAL ANCILLARY ONLY
Bacterial Vaginitis (gardnerella): NEGATIVE
Candida Glabrata: NEGATIVE
Candida Vaginitis: NEGATIVE
Chlamydia: NEGATIVE
Comment: NEGATIVE
Comment: NEGATIVE
Comment: NEGATIVE
Comment: NEGATIVE
Comment: NEGATIVE
Comment: NORMAL
Neisseria Gonorrhea: NEGATIVE
Trichomonas: NEGATIVE

## 2019-04-02 ENCOUNTER — Ambulatory Visit
Admission: RE | Admit: 2019-04-02 | Discharge: 2019-04-02 | Disposition: A | Discharge status: Home / Self Care | Payer: Medicare Other | Source: Ambulatory Visit | Attending: Internal Medicine | Admitting: Internal Medicine

## 2019-04-02 ENCOUNTER — Other Ambulatory Visit: Payer: Self-pay | Admitting: Internal Medicine

## 2019-04-02 ENCOUNTER — Other Ambulatory Visit: Payer: Self-pay

## 2019-04-02 DIAGNOSIS — Z1231 Encounter for screening mammogram for malignant neoplasm of breast: Secondary | ICD-10-CM | POA: Diagnosis not present

## 2019-05-14 ENCOUNTER — Other Ambulatory Visit: Payer: Self-pay | Admitting: Endocrinology

## 2019-05-14 ENCOUNTER — Other Ambulatory Visit: Payer: Self-pay

## 2019-05-14 ENCOUNTER — Other Ambulatory Visit (INDEPENDENT_AMBULATORY_CARE_PROVIDER_SITE_OTHER): Payer: Medicare Other

## 2019-05-14 DIAGNOSIS — E890 Postprocedural hypothyroidism: Secondary | ICD-10-CM | POA: Diagnosis not present

## 2019-05-15 LAB — TSH: TSH: 1.63 u[IU]/mL (ref 0.35–4.50)

## 2019-05-15 LAB — T4, FREE: Free T4: 1.18 ng/dL (ref 0.60–1.60)

## 2019-05-17 ENCOUNTER — Other Ambulatory Visit: Payer: Self-pay

## 2019-05-20 ENCOUNTER — Encounter: Payer: Self-pay | Admitting: Endocrinology

## 2019-05-20 ENCOUNTER — Ambulatory Visit (INDEPENDENT_AMBULATORY_CARE_PROVIDER_SITE_OTHER): Payer: Medicare Other | Admitting: Endocrinology

## 2019-05-20 VITALS — BP 120/80 | HR 76 | Ht 62.0 in | Wt 134.4 lb

## 2019-05-20 DIAGNOSIS — E890 Postprocedural hypothyroidism: Secondary | ICD-10-CM | POA: Diagnosis not present

## 2019-05-20 DIAGNOSIS — I2510 Atherosclerotic heart disease of native coronary artery without angina pectoris: Secondary | ICD-10-CM

## 2019-05-20 NOTE — Progress Notes (Signed)
Patient ID: Dominique Barber, female   DOB: 12-05-1955, 64 y.o.   MRN: [id number]   Reason for Appointment: Thyroid follow-up    History of Present Illness:   Her hypothyroidism was first diagnosed in 1999 after radioactive iodine treatment for toxic nodular goiter Subsequently she became hypothyroid and has been on thyroid supplementation  She has been on the same dose about 11/2014 when her TSH was lower than usual at 0.13 Her dose was reduced from 100 mcg down to 75 g     Subsequently TSH has been normal consistently  Her last visit a virtual visit in 12/2018 She feels fairly good and has no weight change or fatigue            The patient is taking the levothyroxine supplement daily in the morning on waking up on empty stomach  She has not missed any doses Because of fear of allergy from dyes she is taking 50 mcg tablet and using 1-1/2 daily  Not taking any calcium or iron -containing supplements at the same time in the morning   Lab Results  Component Value Date   TSH 1.63 05/14/2019   TSH 1.99 12/05/2018   TSH 1.49 10/06/2017   FREET4 1.18 05/14/2019   FREET4 1.19 12/05/2018   FREET4 1.21 10/06/2017   Wt Readings from Last 3 Encounters:  05/20/19 134 lb 6.4 oz (61 kg)  03/20/19 132 lb (59.9 kg)  02/06/19 132 lb 6.4 oz (60.1 kg)     GOITER:   She has had a multinodular goiter since her 62s. Details of this are also not available at present. She recalls that she has had a benign biopsy in the past   She does not feel any difficulty swallowing or local pressure in the neck   Allergies as of 05/20/2019      Reactions   Atorvastatin Other (See Comments)   Gas pain in abdomen with no relief   Iohexol Hives, Rash   Red dye   Viberzi [eluxadoline] Nausea And Vomiting      Medication List       Accurate as of May 20, 2019  4:18 PM. If you have any questions, ask your nurse or doctor.        STOP taking these medications   Misc. Devices  Misc Stopped by: Elayne Snare, MD     TAKE these medications   ALIGN PO Take by mouth daily.   CALCIUM PO Take 1 tablet by mouth daily.   cetirizine 10 MG tablet Commonly known as: ZYRTEC TAKE 1 TABLET DAILY   hyoscyamine 0.125 MG tablet Commonly known as: LEVSIN Take 0.125 mg by mouth 2 (two) times daily.   levothyroxine 50 MCG tablet Commonly known as: SYNTHROID TAKE ONE AND ONE-HALF (1 & 1/2) TABLETS before breakfast daily   metoprolol tartrate 25 MG tablet Commonly known as: LOPRESSOR Take 1.5 tablets (37.5 mg total) by mouth 2 (two) times daily.   nitroGLYCERIN 0.4 MG SL tablet Commonly known as: NITROSTAT Place 1 tablet (0.4 mg total) under the tongue every 5 (five) minutes as needed for chest pain.   potassium chloride 10 MEQ tablet Commonly known as: KLOR-CON TAKE AS INSTRUCTED BY YOUR PRESCRIBER   rivaroxaban 20 MG Tabs tablet Commonly known as: Xarelto Take 1 tablet (20 mg total) by mouth daily with supper.   rosuvastatin 20 MG tablet Commonly known as: CRESTOR Take 1 tablet (20 mg total) by mouth daily.       Allergies:  Allergies  Allergen Reactions  . Atorvastatin Other (See Comments)    Gas pain in abdomen with no relief  . Iohexol Hives and Rash    Red dye  . Viberzi [Eluxadoline] Nausea And Vomiting    Past Medical History:  Diagnosis Date  . CAD in native artery 1998   Followup 1 month post MI revealed 95% ISR in PTCA site --> PCI with 3.0 mm x 25 mm BMS stent in proximal LAD --> 5 months later recurrent ISR of LAD BMS that compromised D1 --> referred for CABG .--> Emergent redo single vessel CABG with SVG-mLAD for occluded LIMA  . COPD (chronic obstructive pulmonary disease) ([place])   . GERD (gastroesophageal reflux disease)   . Hyperlipidemia   . Hypothyroidism    On Synthroid  . Iron deficiency anemia   . Irritable bowel syndrome    Followed by Dr. Juanita Craver.  Marland Kitchen PAF (paroxysmal atrial fibrillation) ([place]) 05/03/2014   New onset -  originally with RVR  . S/P CABG x 2 07/1996   Initially LIMA-LAD, SVG-D1 --> immediate LIMA failure post CABG with anterior MI --> urgent redo SVG-LAD beyond D2.; Widely patent grafts as of 2005 with left dominant system. Graft to the diagonal branch has a very small target vessel but the vein graft to LAD retrograde fills a large bifurcating D2 and antegrade fills a large D3.  . ST elevation (STEMI) myocardial infarction involving left anterior descending coronary artery ([place]) 123456   Complicated by VF arrest, and anoxic brain injury with status epilepticus; POBA of LAD --> 6 months later, after 2 vessel CABG she had postop complication with occluded LIMA/second anterior STEMI  . Tobacco abuse     Past Surgical History:  Procedure Laterality Date  . APPENDECTOMY    . BREAST BIOPSY Right   . CARDIAC CATHETERIZATION  07/30/1996   normal L main, LAD w/95% stenosis just before stent, diffuse 80-85% end-stent restenosis, 1st diagonal with70-75% ostial stenosis, large optional diagonal that was normal, Cfx with 2 marginals and distal PLA (all normal), RCA normal (Dr. Marella Chimes)  . CARDIAC CATHETERIZATION  01/31/2003   LAD totally occluded in prox 3rd, patent Cfx, SVG to mid LAd patent, SVG to small DX1 patent, LIMA to LAD was atretic and essentially occluded in junction of prox & mid 3rd, R barciocephalic and subclavian were normal (Dr. Marella Chimes)  . CESAREAN SECTION    . CHOLECYSTECTOMY    . CORONARY ANGIOPLASTY  01/19/1996   acute anterior wall MI, anoxic encephalopahty, VF; LCfx free of disease and dominant, RCA patent, L main short and patent, LAD with 70-80% narrowing and focal 95% stenosis in distal 3rd with balloon angioplasty (Dr. Marella Chimes)  . CORONARY ANGIOPLASTY WITH STENT PLACEMENT  02/26/1996   3.0x38mm Multi-Link stent to prox/mid LAD; L main normal & short, Cfx dominant and normal, PDA & PLA normal, RCA non-dominant with small PLA and normal (Dr. Marella Chimes)  . CORONARY  ARTERY BYPASS GRAFT  07/31/1996   x2; LIMA to LAD and SVG to 1st diagonal (Dr. Remus Loffler)  . CORONARY ARTERY BYPASS GRAFT  07/31/1996   x1; SVG to distal LAD (Dr. Remus Loffler)  . NM MYOCAR PERF WALL MOTION  10/2010; 06/2014   a) LexiScan Cardiolite - mild perfusion defect r/t infarct/scar with mild periifarct ischemia in mid anterior & apical anterior region; EF 67%; abnormal, low risk study;; b) Small, moderate intensity Fixed perfusion defect - mid Anterior Wall c/w known Anterior MI. LOW RISK   .  OVARIAN CYST REMOVAL    . TRANSTHORACIC ECHOCARDIOGRAM  02/2012; 06/2014   a) EF 50-55%, mild HK of anterospetal myocardium; mild MR;; b) EF 55-60%, mild HK of apical anterior wall.   Mild MR    Family History  Problem Relation Age of Onset  . Heart failure Other   . Diabetes Other   . Diabetes Mother   . Breast cancer Mother   . Thyroid disease Paternal Aunt   . Thyroid disease Maternal Grandmother     Social History:  reports that she has been smoking cigarettes. She has been smoking about 0.25 packs per day. She has quit using smokeless tobacco. She reports that she does not drink alcohol. No history on file for drug.  REVIEW Of SYSTEMS:  History of hyperlipidemia: Followed by cardiologist and taking Crestor   Lab Results  Component Value Date   CHOL 119 02/06/2019   HDL 52 02/06/2019   LDLCALC 46 02/06/2019   TRIG 119 02/06/2019   CHOLHDL 2.3 02/06/2019   She continues to be on Xarelto for PAF    Examination:   BP 120/80 (BP Location: Left Arm, Patient Position: Sitting, Cuff Size: Normal)   Pulse 76   Ht 5\' 2"  (1.575 m)   Wt 134 lb 6.4 oz (61 kg)   SpO2 98%   BMI 24.58 kg/m   Thyroid: About a 2 cm nodule felt in the midline on swallowing Has only mild enlargement of the right and left lobes, about 1-1/2 times on the left side and minimal on the right No lymphadenopathy in the neck    Assessment    MULTINODULAR goiter: Long-standing  She still has a small persistent  goiter and this is mostly felt in the midline now and less prominent on the lateral parts of the thyroid  No further evaluation needed  Hypothyroidism, post ablative secondary to previous I-131 treatment several years ago She has been on a consistent dose of 75 mcg levothyroxine since about 2016 No complaints of fatigue  TSH normal again     Treatment:  Continue 75 g levothyroxine daily Follow-up in 1 year      Elayne Snare 05/20/2019, 4:18 PM

## 2019-06-07 ENCOUNTER — Encounter: Payer: Self-pay | Admitting: Cardiology

## 2019-06-07 ENCOUNTER — Ambulatory Visit (INDEPENDENT_AMBULATORY_CARE_PROVIDER_SITE_OTHER): Payer: Medicare Other | Admitting: Cardiology

## 2019-06-07 ENCOUNTER — Other Ambulatory Visit: Payer: Self-pay

## 2019-06-07 VITALS — BP 108/78 | HR 55 | Temp 97.2°F | Ht 61.0 in | Wt 133.0 lb

## 2019-06-07 DIAGNOSIS — Z7189 Other specified counseling: Secondary | ICD-10-CM

## 2019-06-07 DIAGNOSIS — E785 Hyperlipidemia, unspecified: Secondary | ICD-10-CM | POA: Diagnosis not present

## 2019-06-07 DIAGNOSIS — Z720 Tobacco use: Secondary | ICD-10-CM

## 2019-06-07 DIAGNOSIS — I10 Essential (primary) hypertension: Secondary | ICD-10-CM

## 2019-06-07 DIAGNOSIS — I2102 ST elevation (STEMI) myocardial infarction involving left anterior descending coronary artery: Secondary | ICD-10-CM

## 2019-06-07 DIAGNOSIS — I2510 Atherosclerotic heart disease of native coronary artery without angina pectoris: Secondary | ICD-10-CM

## 2019-06-07 DIAGNOSIS — I480 Paroxysmal atrial fibrillation: Secondary | ICD-10-CM

## 2019-06-07 NOTE — Progress Notes (Signed)
Primary Care Provider: Nicolette Bang, DO Cardiologist: Glenetta Hew, MD Electrophysiologist: None  Clinic Note: Chief Complaint  Patient presents with  . Follow-up    Doing well from cardiac standpoint  . Coronary Artery Disease    No angina or CHF  . Atrial Fibrillation    No breakthrough spells    HPI:    Dominique Barber is a 64 y.o. female with a PMH notable for CAD-CABG and PAF who presents today for 58-month follow-up.  Past Cardiac History: ? Anterior STEMI 70 (age 72):She had EF arrest with cardiac shock and mild anoxic brain injury (with status epilepticus). -->Emergent PTCA to the LAD ? She then had BMS stent placed in February 1998 ? Shortly thereafter she underwent CABG with the LIMA-LAD and SVG-D2. - For in-stent restenosis  Postop complication -- acute occlusion of the LIMA graft ->recurrent anterior MI ->redo CABG with SVG-mLAD.   Most recent Cath Jan 2005: Patent SVG-D1 &SVG-mLAD- (no significant change in disease). Large bifurcating D1 branch that is filled via retrograde flow from the LAD. Dominant circumflex with small OM1 large bifurcating OM 2 as well as a large OM 3 before terminating in a bifurcation into LPL and large PDA. The LIMA LAD was atretic/occluded. ->EF roughly 50% with inferoapical hypokinesis.  Myoview June 2016 - Small mid anterior wall scar without ischemia. Low risk study  Afib - April 2016 - now on Xarelto. By her report, since her CABG she has been troubled with IBS  I last saw [facility] via telemedicine visit in April 2020 -> she had no major cardiac issues.  Was mostly complaining about stomach issues related to her gastric bypass.  She abdominal pain and bloating.  No cardiac symptoms besides occasional lightheadedness.  Usually has normal blood pressure and blood sugar.  No bleeding on Xarelto.Tildon Husky was last seen on December 25, 2018 by Jory Sims, NP--again nursing and GI complaints with  excessive gas and bloating with abdominal discomfort. -> Discussed salty 6, otherwise no changes  Recent Hospitalizations: None  Reviewed  CV studies:    The following studies were reviewed today: (if available, images/films reviewed: From Epic Chart or Care Everywhere) . None:   Interval History:   SAAMIYA JEPPSEN returns here today for in person visit again noticing that the major issue she is having is the GI symptoms.  With the bloating and gas and intermittent constipation/loose stools, she really is not getting much sleep.  She has a sense of fullness in her stomach that becomes very uncomfortable. She has not been as active because she broke her ankle last year and is slowly building back her strength.  She really denies any cardiac symptoms.  Even the dizziness spells that she was having before are less notable.  No bleeding on Xarelto.  Cardiovascular Review of Symptoms (Summary): no chest pain or dyspnea on exertion positive for - Occasional mild dizziness and irregular heartbeats. negative for - edema, orthopnea, paroxysmal nocturnal dyspnea, rapid heart rate, shortness of breath or Lightheadedness or wooziness, near syncope/yncope, TIA/amaurosis fugax, claudication  The patient does not have symptoms concerning for COVID-19 infection (fever, chills, cough, or new shortness of breath).  The patient is practicing social distancing & Masking.  Immunization History  Administered Date(s) Administered  . Influenza,inj,Quad PF,6+ Mos 12/04/2013, 12/05/2014, 10/10/2017  . Influenza-Unspecified 01/03/2010  . Tdap 01/03/2013  --> Was not sure about how to do her COVID-19 vaccine--concerned about potential side effects   REVIEWED OF  SYSTEMS   Review of Systems  Constitutional: Negative for malaise/fatigue and weight loss.  HENT: Negative for ear discharge and nosebleeds.   Respiratory: Negative for cough and shortness of breath.   Cardiovascular: Positive for leg swelling  (Right ankle still somewhat swollen).  Gastrointestinal: Positive for abdominal pain and diarrhea. Negative for blood in stool and melena.       Gas, bloating  Genitourinary: Negative for frequency and hematuria.  Musculoskeletal: Positive for joint pain (She broke her right ankle last year).  Neurological: Negative for dizziness, focal weakness, weakness and headaches.  Psychiatric/Behavioral: Negative for depression and memory loss. The patient is nervous/anxious and has insomnia (Poor sleep mostly because of GI upset).    I have reviewed and (if needed) personally updated the patient's problem list, medications, allergies, past medical and surgical history, social and family history.   PAST MEDICAL HISTORY   Past Medical History:  Diagnosis Date  . CAD in native artery 1998   Followup 1 month post MI revealed 95% ISR in PTCA site --> PCI with 3.0 mm x 25 mm BMS stent in proximal LAD --> 5 months later recurrent ISR of LAD BMS that compromised D1 --> referred for CABG .--> Emergent redo single vessel CABG with SVG-mLAD for occluded LIMA  . COPD (chronic obstructive pulmonary disease) ([place])   . GERD (gastroesophageal reflux disease)   . Hyperlipidemia   . Hypothyroidism    On Synthroid  . Iron deficiency anemia   . Irritable bowel syndrome    Followed by Dr. Juanita Craver.  Marland Kitchen PAF (paroxysmal atrial fibrillation) ([place]) 05/03/2014   New onset - originally with RVR  . S/P CABG x 2 07/1996   Initially LIMA-LAD, SVG-D1 --> immediate LIMA failure post CABG with anterior MI --> urgent redo SVG-LAD beyond D2.; Widely patent grafts as of 2005 with left dominant system. Graft to the diagonal branch has a very small target vessel but the vein graft to LAD retrograde fills a large bifurcating D2 and antegrade fills a large D3.  . ST elevation (STEMI) myocardial infarction involving left anterior descending coronary artery ([place]) 70/35/0093   Complicated by VF arrest, and anoxic brain injury with status  epilepticus; POBA of LAD --> 6 months later, after 2 vessel CABG she had postop complication with occluded LIMA/second anterior STEMI  . Tobacco abuse     PAST SURGICAL HISTORY   Past Surgical History:  Procedure Laterality Date  . APPENDECTOMY    . BREAST BIOPSY Right   . CARDIAC CATHETERIZATION  07/30/1996   normal L main, LAD w/95% stenosis just before stent, diffuse 80-85% end-stent restenosis, 1st diagonal with70-75% ostial stenosis, large optional diagonal that was normal, Cfx with 2 marginals and distal PLA (all normal), RCA normal (Dr. Marella Chimes)  . CARDIAC CATHETERIZATION  01/31/2003   LAD totally occluded in prox 3rd, patent Cfx, SVG to mid LAd patent, SVG to small DX1 patent, LIMA to LAD was atretic and essentially occluded in junction of prox & mid 3rd, R barciocephalic and subclavian were normal (Dr. Marella Chimes)  . CESAREAN SECTION    . CHOLECYSTECTOMY    . CORONARY ANGIOPLASTY  01/19/1996   acute anterior wall MI, anoxic encephalopahty, VF; LCfx free of disease and dominant, RCA patent, L main short and patent, LAD with 70-80% narrowing and focal 95% stenosis in distal 3rd with balloon angioplasty (Dr. Marella Chimes)  . CORONARY ANGIOPLASTY WITH STENT PLACEMENT  02/26/1996   3.0x44mm Multi-Link stent to prox/mid LAD; L main normal &  short, Cfx dominant and normal, PDA & PLA normal, RCA non-dominant with small PLA and normal (Dr. Marella Chimes)  . CORONARY ARTERY BYPASS GRAFT  07/31/1996   x2; LIMA to LAD and SVG to 1st diagonal (Dr. Remus Loffler)  . CORONARY ARTERY BYPASS GRAFT  07/31/1996   x1; SVG to distal LAD (Dr. Remus Loffler)  . NM MYOCAR PERF WALL MOTION  10/2010; 06/2014   a) LexiScan Cardiolite - mild perfusion defect r/t infarct/scar with mild periifarct ischemia in mid anterior & apical anterior region; EF 67%; abnormal, low risk study;; b) Small, moderate intensity Fixed perfusion defect - mid Anterior Wall c/w known Anterior MI. LOW RISK   . OVARIAN CYST REMOVAL    .  TRANSTHORACIC ECHOCARDIOGRAM  02/2012; 06/2014   a) EF 50-55%, mild HK of anterospetal myocardium; mild MR;; b) EF 55-60%, mild HK of apical anterior wall.   Mild MR    MEDICATIONS/ALLERGIES   Current Meds  Medication Sig  . CALCIUM PO Take 1 tablet by mouth daily.   . cetirizine (ZYRTEC) 10 MG tablet TAKE 1 TABLET DAILY  . hyoscyamine (LEVSIN, ANASPAZ) 0.125 MG tablet Take 0.125 mg by mouth 2 (two) times daily.   Marland Kitchen levothyroxine (SYNTHROID) 50 MCG tablet TAKE ONE AND ONE-HALF (1 & 1/2) TABLETS before breakfast daily  . metoprolol tartrate (LOPRESSOR) 25 MG tablet Take 1.5 tablets (37.5 mg total) by mouth 2 (two) times daily.  . nitroGLYCERIN (NITROSTAT) 0.4 MG SL tablet Place 1 tablet (0.4 mg total) under the tongue every 5 (five) minutes as needed for chest pain.  . potassium chloride (KLOR-CON) 10 MEQ tablet TAKE AS INSTRUCTED BY YOUR PRESCRIBER  . Probiotic Product (ALIGN PO) Take by mouth daily.  . rivaroxaban (XARELTO) 20 MG TABS tablet Take 1 tablet (20 mg total) by mouth daily with supper.  . rosuvastatin (CRESTOR) 20 MG tablet Take 1 tablet (20 mg total) by mouth daily.   Has not required breakthrough NTG  Allergies  Allergen Reactions  . Atorvastatin Other (See Comments)    Gas pain in abdomen with no relief  . Iohexol Hives and Rash    Red dye  . Viberzi [Eluxadoline] Nausea And Vomiting    SOCIAL HISTORY/FAMILY HISTORY   Reviewed in Epic:  Pertinent findings: No change  OBJCTIVE -PE, EKG, labs   Wt Readings from Last 3 Encounters:  06/07/19 133 lb (60.3 kg)  05/20/19 134 lb 6.4 oz (61 kg)  03/20/19 132 lb (59.9 kg)    Physical Exam: BP 108/78 (BP Location: Right Arm, Patient Position: Sitting, Cuff Size: Normal)   Pulse (!) 55   Temp (!) 97.2 F (36.2 C)   Ht 5\' 1"  (1.549 m)   Wt 133 lb (60.3 kg)   SpO2 96%   BMI 25.13 kg/m  Physical Exam Constitutional:      General: She is not in acute distress.    Appearance: Normal appearance. She is normal  weight.     Comments: Healthy-appearing.  Well-groomed.  HENT:     Head: Normocephalic and atraumatic.  Neck:     Vascular: No carotid bruit.  Cardiovascular:     Rate and Rhythm: Normal rate and regular rhythm.     Pulses: Normal pulses.     Heart sounds: Murmur (1/6C-D SEM at RUSB) heard.  No friction rub. No gallop.   Pulmonary:     Effort: Pulmonary effort is normal. No respiratory distress.     Breath sounds: Normal breath sounds. No stridor. No wheezing, rhonchi  or rales.  Abdominal:     General: There is no distension.     Palpations: Abdomen is soft.     Tenderness: There is abdominal tenderness. There is no rebound.     Comments: Mild tenderness, somewhat increased bowel sounds.   Musculoskeletal:        General: No swelling. Normal range of motion.     Cervical back: Normal range of motion and neck supple.  Skin:    General: Skin is warm and dry.  Neurological:     General: No focal deficit present.     Mental Status: She is alert and oriented to person, place, and time.  Psychiatric:        Mood and Affect: Mood normal.        Behavior: Behavior normal.        Thought Content: Thought content normal.        Judgment: Judgment normal.     Adult ECG Report  Rate: 55;  Rhythm: sinus bradycardia and CRO anterior MI, age-indeterminate.  Otherwise normal axis, intervals and durations.;   Narrative Interpretation: Stable EKG  Recent Labs:    Lab Results  Component Value Date   CHOL 119 02/06/2019   HDL 52 02/06/2019   LDLCALC 46 02/06/2019   TRIG 119 02/06/2019   CHOLHDL 2.3 02/06/2019   Lab Results  Component Value Date   CREATININE 1.12 (H) 03/20/2019   BUN 17 03/20/2019   NA 143 03/20/2019   K 4.3 03/20/2019   CL 106 03/20/2019   CO2 21 03/20/2019   Lab Results  Component Value Date   TSH 1.63 05/14/2019    ASSESSMENT/PLAN    Problem List Items Addressed This Visit    CAD- 100% LAD, s/p SVG-LAD after failed LIMA-LAD - Primary (Chronic)    No  further anginal symptoms.  No heart failure symptoms.  Doing well. She is almost 5 years out from her last stress test.  We talked about it today, and chose not to order Myoview, but would probably want to consider reassessing in the next 6 to 12 months.  Plan: For now continue moderate dose metoprolol and statin.    No aspirin or Plavix because of Xarelto.  Continue activity and exercise.  Consider follow-up ischemic evaluation with Myoview after 64-month follow-up      Hyperlipidemia with target LDL less than 70 (Chronic)    Well-controlled on rosuvastatin 20 mg daily.  Labs look great.      Paroxysmal atrial fibrillation ([place]); CHA2DS2-VASc Score 3. On Xarelto (Chronic)    No signs or symptoms to suggest breakthrough episodes of [name].  Rate seems to be well controlled with metoprolol.  Xarelto for anticoagulation.  Stable.  No changes      Relevant Orders   EKG 12-Lead (Completed)   ST elevation (STEMI) myocardial infarction involving left anterior descending coronary artery (HCC) (Chronic)    Distant history of anterior MI in the setting of occluded LIMA graft.  There is moderate perfusion defect noted on Myoview but no ischemia.  No recurrent angina or heart failure symptoms.  On beta-blocker and statin.  Not on aspirin or Plavix because of Xarelto. With infarct noted she is also on benazepril.      Relevant Orders   EKG 12-Lead (Completed)   Tobacco abuse (Chronic)    She rarely smokes now.  Not enough to really make it concerned.  I counseled her on continuing to fully quit.      Essential hypertension  Blood pressure well controlled on moderate dose metoprolol.  No change      Relevant Orders   EKG 12-Lead (Completed)   Educated about COVID-19 virus infection    We talked about her concerns about the COVID-19 vaccine.  I understand her concerns that probably would not build do well with potential side effects.  I do think she and I both agree that she needs to  have vaccination.  As such, we decided the best course of action would be for her to get the [facility] single dose vaccine.  While this option may not provide 90+ percent protectivity, it precludes the need for having the second shot.         COVID-19 Education: The signs and symptoms of COVID-19 were discussed with the patient and how to seek care for testing (follow up with PCP or arrange E-visit).   The importance of social distancing and COVID-19 vaccination was discussed today.  I spent a total of 15minutes with the patient. >  50% of the time was spent in direct patient consultation.  Additional time spent with chart review  / charting (studies, outside notes, etc): 7 Total Time: 29 min   Current medicines are reviewed at length with the patient today.  (+/- concerns) asked about COVID-19 vaccine  Notice: This dictation was prepared with Dragon dictation along with smaller phrase technology. Any transcriptional errors that result from this process are unintentional and may not be corrected upon review.  Patient Instructions / Medication Changes & Studies & Tests Ordered   Patient Instructions  Medication Instructions:  No changes  *If you need a refill on your cardiac medications before your next appointment, please call your pharmacy*   Lab Work: Not needed If you have labs (blood work) drawn today and your tests are completely normal, you will receive your results only by: Marland Kitchen MyChart Message (if you have MyChart) OR . A paper copy in the mail If you have any lab test that is abnormal or we need to change your treatment, we will call you to review the results.   Testing/Procedures: Not needed   Follow-Up: At [facility], you and your health needs are our priority.  As part of our continuing mission to provide you with exceptional heart care, we have created designated Provider Care Teams.  These Care Teams include your primary Cardiologist (physician) and  Advanced Practice Providers (APPs -  Physician Assistants and Nurse Practitioners) who all work together to provide you with the care you need, when you need it.  We recommend signing up for the patient portal called "MyChart".  Sign up information is provided on this After Visit Summary.  MyChart is used to connect with patients for Virtual Visits (Telemedicine).  Patients are able to view lab/test results, encounter notes, upcoming appointments, etc.  Non-urgent messages can be sent to your provider as well.   To learn more about what you can do with MyChart, go to [email or web address].    Your next appointment:   12 month(s)  The format for your next appointment:   In Person  Provider:   Glenetta Hew, MD 6 month with Arnold Long NP  Other Instructions  Recommend  You get the [place]  - covid  vaccine   Studies Ordered:   Orders Placed This Encounter  Procedures  . EKG 12-Lead     Glenetta Hew, M.D., M.S. Interventional Cardiologist   Pager # [phone] Phone # [phone] [address]  [place], [place] [address]   Thank you for choosing Heart[facility] at [facility]!!

## 2019-06-07 NOTE — Patient Instructions (Addendum)
Medication Instructions:  No changes  *If you need a refill on your cardiac medications before your next appointment, please call your pharmacy*   Lab Work: Not needed If you have labs (blood work) drawn today and your tests are completely normal, you will receive your results only by:  [place] (if you have MyChart) OR  A paper copy in the mail If you have any lab test that is abnormal or we need to change your treatment, we will call you to review the results.   Testing/Procedures: Not needed   Follow-Up: At [facility], you and your health needs are our priority.  As part of our continuing mission to provide you with exceptional heart care, we have created designated Provider Care Teams.  These Care Teams include your primary Cardiologist (physician) and Advanced Practice Providers (APPs -  Physician Assistants and Nurse Practitioners) who all work together to provide you with the care you need, when you need it.  We recommend signing up for the patient portal called "MyChart".  Sign up information is provided on this After Visit Summary.  MyChart is used to connect with patients for Virtual Visits (Telemedicine).  Patients are able to view lab/test results, encounter notes, upcoming appointments, etc.  Non-urgent messages can be sent to your provider as well.   To learn more about what you can do with MyChart, go to [email or web address].    Your next appointment:   12 month(s)  The format for your next appointment:   In Person  Provider:   Glenetta Hew, MD 6 month with Arnold Long NP  Other Instructions  Recommend  You get the [facility] and Wynetta Emery  - covid  vaccine

## 2019-06-14 ENCOUNTER — Encounter: Payer: Self-pay | Admitting: Cardiology

## 2019-06-14 DIAGNOSIS — Z7189 Other specified counseling: Secondary | ICD-10-CM | POA: Insufficient documentation

## 2019-06-14 DIAGNOSIS — I10 Essential (primary) hypertension: Secondary | ICD-10-CM | POA: Insufficient documentation

## 2019-06-14 NOTE — Assessment & Plan Note (Signed)
Well-controlled on rosuvastatin 20 mg daily.  Labs look great.

## 2019-06-14 NOTE — Assessment & Plan Note (Signed)
Distant history of anterior MI in the setting of occluded LIMA graft.  There is moderate perfusion defect noted on Myoview but no ischemia.  No recurrent angina or heart failure symptoms.  On beta-blocker and statin.  Not on aspirin or Plavix because of Xarelto. With infarct noted she is also on benazepril.

## 2019-06-14 NOTE — Assessment & Plan Note (Signed)
No further anginal symptoms.  No heart failure symptoms.  Doing well. She is almost 5 years out from her last stress test.  We talked about it today, and chose not to order Myoview, but would probably want to consider reassessing in the next 6 to 12 months.  Plan: For now continue moderate dose metoprolol and statin.    No aspirin or Plavix because of Xarelto.  Continue activity and exercise.  Consider follow-up ischemic evaluation with Myoview after 64-month follow-up

## 2019-06-14 NOTE — Assessment & Plan Note (Signed)
We talked about her concerns about the COVID-19 vaccine.  I understand her concerns that probably would not build do well with potential side effects.  I do think she and I both agree that she needs to have vaccination.  As such, we decided the best course of action would be for her to get the [facility] single dose vaccine.  While this option may not provide 90+ percent protectivity, it precludes the need for having the second shot.

## 2019-06-14 NOTE — Assessment & Plan Note (Addendum)
No signs or symptoms to suggest breakthrough episodes of [name].  Rate seems to be well controlled with metoprolol.  Xarelto for anticoagulation.  Stable.  No changes

## 2019-06-14 NOTE — Assessment & Plan Note (Signed)
She rarely smokes now.  Not enough to really make it concerned.  I counseled her on continuing to fully quit.

## 2019-06-14 NOTE — Assessment & Plan Note (Signed)
Blood pressure well controlled on moderate dose metoprolol.  No change

## 2019-06-16 ENCOUNTER — Other Ambulatory Visit: Payer: Self-pay | Admitting: Endocrinology

## 2019-06-24 ENCOUNTER — Encounter: Payer: Self-pay | Admitting: Internal Medicine

## 2019-06-24 ENCOUNTER — Telehealth (INDEPENDENT_AMBULATORY_CARE_PROVIDER_SITE_OTHER): Payer: Medicare Other | Admitting: Internal Medicine

## 2019-06-24 DIAGNOSIS — E034 Atrophy of thyroid (acquired): Secondary | ICD-10-CM

## 2019-06-24 DIAGNOSIS — E042 Nontoxic multinodular goiter: Secondary | ICD-10-CM

## 2019-06-24 DIAGNOSIS — I10 Essential (primary) hypertension: Secondary | ICD-10-CM

## 2019-06-24 DIAGNOSIS — Z951 Presence of aortocoronary bypass graft: Secondary | ICD-10-CM

## 2019-06-24 DIAGNOSIS — E785 Hyperlipidemia, unspecified: Secondary | ICD-10-CM | POA: Diagnosis not present

## 2019-06-24 DIAGNOSIS — I480 Paroxysmal atrial fibrillation: Secondary | ICD-10-CM

## 2019-06-24 NOTE — Progress Notes (Signed)
Virtual Visit via Telephone Note  I connected with Dominique Barber, on 06/24/2019 at 3:35 PM by telephone due to the COVID-19 pandemic and verified that I am speaking with the correct person using two identifiers.   Consent: I discussed the limitations, risks, security and privacy concerns of performing an evaluation and management service by telephone and the availability of in person appointments. I also discussed with the patient that there may be a patient responsible charge related to this service. The patient expressed understanding and agreed to proceed.   Location of Patient: Home   Location of Provider: Clinic    Persons participating in Telemedicine visit: Addaline Peplinski [facility] Dr. Juleen China      History of Present Illness: Patient has a visit to follow up on chronic medical conditions. She had an annual medicare wellness exam in March 2021. Her labs obtained that visit were stable. She reports that she had a mammogram last month that was normal.   She sees an endocrinologist, Dr. Dwyane Dee, to follow on her hypothyroidism and thyroid nodules seen on ultrasound. She has blood work and measurement of the thyroid nodules with him done every 6 months.   Sees cardiologist every 6 months. She has a history of STEMI, CAD s/p CABG x2, PAF, HTN, HLD.   Reports that her BP has been stable at home. She has had no concerns or symptoms of uncontrolled hypertension.    Past Medical History:  Diagnosis Date  . CAD in native artery 1998   Followup 1 month post MI revealed 95% ISR in PTCA site --> PCI with 3.0 mm x 25 mm BMS stent in proximal LAD --> 5 months later recurrent ISR of LAD BMS that compromised D1 --> referred for CABG .--> Emergent redo single vessel CABG with SVG-mLAD for occluded LIMA  . COPD (chronic obstructive pulmonary disease) ([place])   . GERD (gastroesophageal reflux disease)   . Hyperlipidemia   . Hypothyroidism    On Synthroid  . Iron deficiency  anemia   . Irritable bowel syndrome    Followed by Dr. Juanita Craver.  Marland Kitchen PAF (paroxysmal atrial fibrillation) ([place]) 05/03/2014   New onset - originally with RVR  . S/P CABG x 2 07/1996   Initially LIMA-LAD, SVG-D1 --> immediate LIMA failure post CABG with anterior MI --> urgent redo SVG-LAD beyond D2.; Widely patent grafts as of 2005 with left dominant system. Graft to the diagonal branch has a very small target vessel but the vein graft to LAD retrograde fills a large bifurcating D2 and antegrade fills a large D3.  . ST elevation (STEMI) myocardial infarction involving left anterior descending coronary artery ([place]) 53/66/4403   Complicated by VF arrest, and anoxic brain injury with status epilepticus; POBA of LAD --> 6 months later, after 2 vessel CABG she had postop complication with occluded LIMA/second anterior STEMI  . Tobacco abuse    Allergies  Allergen Reactions  . Atorvastatin Other (See Comments)    Gas pain in abdomen with no relief  . Other Hives  . Iohexol Hives and Rash    Red dye  . Viberzi [Eluxadoline] Nausea And Vomiting    Current Outpatient Medications on File Prior to Visit  Medication Sig Dispense Refill  . CALCIUM PO Take 1 tablet by mouth daily.     . cetirizine (ZYRTEC) 10 MG tablet TAKE 1 TABLET DAILY 90 tablet 4  . hyoscyamine (LEVSIN, ANASPAZ) 0.125 MG tablet Take 0.125 mg by mouth 2 (two) times daily.     Marland Kitchen  levothyroxine (SYNTHROID) 50 MCG tablet TAKE ONE AND ONE-HALF TABLETS DAILY BEFORE BREAKFAST 135 tablet 3  . metoprolol tartrate (LOPRESSOR) 25 MG tablet Take 1.5 tablets (37.5 mg total) by mouth 2 (two) times daily. 225 tablet 3  . potassium chloride (KLOR-CON) 10 MEQ tablet TAKE AS INSTRUCTED BY YOUR PRESCRIBER 180 tablet 3  . Probiotic Product (ALIGN PO) Take 1 capsule by mouth daily.     . rivaroxaban (XARELTO) 20 MG TABS tablet Take 1 tablet (20 mg total) by mouth daily with supper. 90 tablet 3  . rosuvastatin (CRESTOR) 20 MG tablet Take 1 tablet (20  mg total) by mouth daily. 90 tablet 3  . nitroGLYCERIN (NITROSTAT) 0.4 MG SL tablet Place 1 tablet (0.4 mg total) under the tongue every 5 (five) minutes as needed for chest pain. (Patient not taking: Reported on 06/24/2019) 25 tablet 1   No current facility-administered medications on file prior to visit.    Observations/Objective: NAD. Speaking clearly.  Work of breathing normal.  Alert and oriented. Mood appropriate.   Assessment and Plan: 1. Hypothyroidism due to acquired atrophy of thyroid 2. Multinodular goiter Followed by endocrinology. Reports compliance with Synthroid. Reports recent work up showed stable thyroid labs and sizes of nodules. She follows q6 months.   3. S/P CABG x 2 4. Hyperlipidemia with target LDL less than 70 Followed by cardiology q6 months. LDL 46 in Feb 2021. Complaint with Crestor. Denies angina type symptoms.   5. Paroxysmal atrial fibrillation ([place]); CHA2DS2-VASc Score 3. On Xarelto On Xarelto for anticoagulation and Metoprolol for rate control. No concerns about abnormal bleeding.   6. Essential hypertension BP well controlled at home. Continue Metorpolol.     Follow Up Instructions: Every year for annual exam or sooner for acute concerns    I discussed the assessment and treatment plan with the patient. The patient was provided an opportunity to ask questions and all were answered. The patient agreed with the plan and demonstrated an understanding of the instructions.   The patient was advised to call back or seek an in-person evaluation if the symptoms worsen or if the condition fails to improve as anticipated.     I provided 40 minutes total of non-face-to-face time during this encounter including median intraservice time, reviewing previous notes, investigations, ordering medications, medical decision making, coordinating care and patient verbalized understanding at the end of the visit.    Phill Myron, D.O. Primary Care at [facility]  06/24/2019, 3:35 PM

## 2019-07-04 DIAGNOSIS — K5730 Diverticulosis of large intestine without perforation or abscess without bleeding: Secondary | ICD-10-CM | POA: Diagnosis not present

## 2019-07-04 DIAGNOSIS — E739 Lactose intolerance, unspecified: Secondary | ICD-10-CM | POA: Diagnosis not present

## 2019-07-04 DIAGNOSIS — K219 Gastro-esophageal reflux disease without esophagitis: Secondary | ICD-10-CM | POA: Diagnosis not present

## 2019-07-04 DIAGNOSIS — K580 Irritable bowel syndrome with diarrhea: Secondary | ICD-10-CM | POA: Diagnosis not present

## 2019-07-04 DIAGNOSIS — Z86010 Personal history of colonic polyps: Secondary | ICD-10-CM | POA: Diagnosis not present

## 2019-07-04 DIAGNOSIS — Z23 Encounter for immunization: Secondary | ICD-10-CM | POA: Diagnosis not present

## 2019-09-10 ENCOUNTER — Other Ambulatory Visit: Payer: Self-pay | Admitting: Cardiology

## 2019-10-21 ENCOUNTER — Other Ambulatory Visit: Payer: Self-pay | Admitting: Adult Health

## 2019-12-09 ENCOUNTER — Other Ambulatory Visit: Payer: Self-pay | Admitting: Cardiology

## 2019-12-31 ENCOUNTER — Ambulatory Visit
Admission: EM | Admit: 2019-12-31 | Discharge: 2019-12-31 | Disposition: A | Discharge status: Home / Self Care | Payer: Medicare Other

## 2019-12-31 ENCOUNTER — Other Ambulatory Visit: Payer: Self-pay

## 2019-12-31 DIAGNOSIS — R140 Abdominal distension (gaseous): Secondary | ICD-10-CM | POA: Diagnosis not present

## 2019-12-31 DIAGNOSIS — Z9889 Other specified postprocedural states: Secondary | ICD-10-CM | POA: Diagnosis not present

## 2019-12-31 DIAGNOSIS — R630 Anorexia: Secondary | ICD-10-CM | POA: Diagnosis not present

## 2019-12-31 DIAGNOSIS — R1084 Generalized abdominal pain: Secondary | ICD-10-CM | POA: Diagnosis not present

## 2019-12-31 NOTE — ED Provider Notes (Signed)
[facility]    CSN: [id number] Arrival date & time: 12/31/19  1702      History   Chief Complaint Chief Complaint  Patient presents with  . Bloated    HPI Dominique Barber is a 64 y.o. female  With extensive medical history as below presenting for upper abdominal pain that is since become generalized.  States it initially began around Thanksgiving, gotten worse since.  Gas-X not improving.  States she has been having some relief after bowel movements, those are becoming more infrequent.  Last BM this morning: Loose.  No melena, hematochezia.  Denies history of obstruction.  Nursing distention, decreased appetite, nausea.  Past Medical History:  Diagnosis Date  . CAD in native artery 1998   Followup 1 month post MI revealed 95% ISR in PTCA site --> PCI with 3.0 mm x 25 mm BMS stent in proximal LAD --> 5 months later recurrent ISR of LAD BMS that compromised D1 --> referred for CABG .--> Emergent redo single vessel CABG with SVG-mLAD for occluded LIMA  . COPD (chronic obstructive pulmonary disease) ([facility])   . GERD (gastroesophageal reflux disease)   . Hyperlipidemia   . Hypothyroidism    On Synthroid  . Iron deficiency anemia   . Irritable bowel syndrome    Followed by Dr. Charna Elizabeth.  Marland Kitchen PAF (paroxysmal atrial fibrillation) ([facility]) 05/03/2014   New onset - originally with RVR  . S/P CABG x 2 07/1996   Initially LIMA-LAD, SVG-D1 --> immediate LIMA failure post CABG with anterior MI --> urgent redo SVG-LAD beyond D2.; Widely patent grafts as of 2005 with left dominant system. Graft to the diagonal branch has a very small target vessel but the vein graft to LAD retrograde fills a large bifurcating D2 and antegrade fills a large D3.  . ST elevation (STEMI) myocardial infarction involving left anterior descending coronary artery ([facility]) 01/19/1996   Complicated by VF arrest, and anoxic brain injury with status epilepticus; POBA of LAD --> 6 months later, after 2 vessel CABG  she had postop complication with occluded LIMA/second anterior STEMI  . Tobacco abuse     Patient Active Problem List   Diagnosis Date Noted  . Essential hypertension 06/14/2019  . Paroxysmal atrial fibrillation ([facility]); CHA2DS2-VASc Score 3. On Xarelto 05/04/2014  . COPD (chronic obstructive pulmonary disease) ([facility]) 05/04/2014  . Hyperlipidemia with target LDL less than 70   . Tobacco abuse   . Hypothyroid 12/03/2012  . Multinodular goiter 12/03/2012  . CAD- 100% LAD, s/p SVG-LAD after failed LIMA-LAD 11/14/1996    Class: Chronic  . S/P CABG x 2 07/03/1996  . ST elevation (STEMI) myocardial infarction involving left anterior descending coronary artery ([facility]) 01/19/1996    Past Surgical History:  Procedure Laterality Date  . APPENDECTOMY    . BREAST BIOPSY Right   . CARDIAC CATHETERIZATION  07/30/1996   normal L main, LAD w/95% stenosis just before stent, diffuse 80-85% end-stent restenosis, 1st diagonal with70-75% ostial stenosis, large optional diagonal that was normal, Cfx with 2 marginals and distal PLA (all normal), RCA normal (Dr. Jonette Eva)  . CARDIAC CATHETERIZATION  01/31/2003   LAD totally occluded in prox 3rd, patent Cfx, SVG to mid LAd patent, SVG to small DX1 patent, LIMA to LAD was atretic and essentially occluded in junction of prox & mid 3rd, R barciocephalic and subclavian were normal (Dr. Jonette Eva)  . CESAREAN SECTION    . CHOLECYSTECTOMY    . CORONARY ANGIOPLASTY  01/19/1996  acute anterior wall MI, anoxic encephalopahty, VF; LCfx free of disease and dominant, RCA patent, L main short and patent, LAD with 70-80% narrowing and focal 95% stenosis in distal 3rd with balloon angioplasty (Dr. Marella Chimes)  . CORONARY ANGIOPLASTY WITH STENT PLACEMENT  02/26/1996   3.0x69mm Multi-Link stent to prox/mid LAD; L main normal & short, Cfx dominant and normal, PDA & PLA normal, RCA non-dominant with small PLA and normal (Dr. Marella Chimes)  . CORONARY ARTERY BYPASS GRAFT   07/31/1996   x2; LIMA to LAD and SVG to 1st diagonal (Dr. Remus Loffler)  . CORONARY ARTERY BYPASS GRAFT  07/31/1996   x1; SVG to distal LAD (Dr. Remus Loffler)  . NM MYOCAR PERF WALL MOTION  10/2010; 06/2014   a) LexiScan Cardiolite - mild perfusion defect r/t infarct/scar with mild periifarct ischemia in mid anterior & apical anterior region; EF 67%; abnormal, low risk study;; b) Small, moderate intensity Fixed perfusion defect - mid Anterior Wall c/w known Anterior MI. LOW RISK   . OVARIAN CYST REMOVAL    . TRANSTHORACIC ECHOCARDIOGRAM  02/2012; 06/2014   a) EF 50-55%, mild HK of anterospetal myocardium; mild MR;; b) EF 55-60%, mild HK of apical anterior wall.   Mild MR    OB History   No obstetric history on file.      Home Medications    Prior to Admission medications   Medication Sig Start Date End Date Taking? Authorizing Provider  CALCIUM PO Take 1 tablet by mouth daily.     [provider]  cetirizine (ZYRTEC) 10 MG tablet TAKE 1 TABLET DAILY 11/27/17   Elayne Snare, MD  hyoscyamine (LEVSIN, ANASPAZ) 0.125 MG tablet Take 0.125 mg by mouth 2 (two) times daily.     [provider]  levothyroxine (SYNTHROID) 50 MCG tablet TAKE ONE AND ONE-HALF TABLETS DAILY BEFORE BREAKFAST 06/17/19   Elayne Snare, MD  metoprolol tartrate (LOPRESSOR) 25 MG tablet TAKE ONE AND ONE-HALF TABLETS TWICE A DAY 10/22/19   Leonie Man, MD  potassium chloride (KLOR-CON) 10 MEQ tablet TAKE AS INSTRUCTED BY YOUR PRESCRIBER 12/10/19   Leonie Man, MD  Probiotic Product (ALIGN PO) Take 1 capsule by mouth daily.     [provider]  rivaroxaban (XARELTO) 20 MG TABS tablet Take 1 tablet (20 mg total) by mouth daily with supper. 12/25/18   Lendon Colonel, NP  rosuvastatin (CRESTOR) 20 MG tablet Take 1 tablet (20 mg total) by mouth daily. 12/25/18   Lendon Colonel, NP    Family History Family History  Problem Relation Age of Onset  . Heart failure Other   . Diabetes Other   .  Diabetes Mother   . Breast cancer Mother   . Thyroid disease Paternal Aunt   . Thyroid disease Maternal Grandmother     Social History Social History   Tobacco Use  . Smoking status: Current Some Day Smoker    Packs/day: 0.25    Types: Cigarettes  . Smokeless tobacco: Former Systems developer  . Tobacco comment: Quit at time of MI, restarted in 2009  Substance Use Topics  . Alcohol use: No    Alcohol/week: 0.0 standard drinks     Allergies   Atorvastatin, Other, Iohexol, and Viberzi [eluxadoline]   Review of Systems Review of Systems  Constitutional: Negative for fatigue and fever.  HENT: Negative for ear pain, sinus pain, sore throat and voice change.   Eyes: Negative for pain, redness and visual disturbance.  Respiratory: Negative for  cough and shortness of breath.   Cardiovascular: Negative for chest pain and palpitations.  Gastrointestinal: Positive for abdominal distention, abdominal pain, diarrhea and nausea. Negative for blood in stool and vomiting.  Musculoskeletal: Negative for arthralgias and myalgias.  Skin: Negative for rash and wound.  Neurological: Negative for syncope and headaches.     Physical Exam Triage Vital Signs ED Triage Vitals  Enc Vitals Group     BP 12/31/19 2019 (!) 146/84     Pulse Rate 12/31/19 2019 73     Resp 12/31/19 2019 18     Temp 12/31/19 2019 97.7 F (36.5 C)     Temp Source 12/31/19 2019 Oral     SpO2 12/31/19 2019 97 %     Weight --      Height --      Head Circumference --      Peak Flow --      Pain Score 12/31/19 2020 0     Pain Loc --      Pain Edu? --      Excl. in GC? --    No data found.  Updated Vital Signs BP (!) 146/84 (BP Location: Left Arm)   Pulse 73   Temp 97.7 F (36.5 C) (Oral)   Resp 18   SpO2 97%   Visual Acuity Right Eye Distance:   Left Eye Distance:   Bilateral Distance:    Right Eye Near:   Left Eye Near:    Bilateral Near:     Physical Exam Constitutional:      General: She is not in acute  distress. HENT:     Head: Normocephalic and atraumatic.  Eyes:     General: No scleral icterus.    Pupils: Pupils are equal, round, and reactive to light.  Cardiovascular:     Rate and Rhythm: Normal rate and regular rhythm.  Pulmonary:     Effort: Pulmonary effort is normal. No respiratory distress.     Breath sounds: No wheezing.  Abdominal:     General: There is distension.     Tenderness: There is abdominal tenderness. There is guarding.     Comments: Diffuse abdominal tenderness with voluntary guarding.  Skin:    Coloration: Skin is not jaundiced or pale.  Neurological:     Mental Status: She is alert and oriented to person, place, and time.      UC Treatments / Results  Labs (all labs ordered are listed, but only abnormal results are displayed) Labs Reviewed - No data to display  EKG   Radiology No results found.  Procedures Procedures (including critical care time)  Medications Ordered in UC Medications - No data to display  Initial Impression / Assessment and Plan / UC Course  I have reviewed the triage vital signs and the nursing notes.  Pertinent labs & imaging results that were available during my care of the patient were reviewed by me and considered in my medical decision making (see chart for details).      Afebrile, nontoxic, though does seem to be in significant discomfort.  Does have history of abdominal surgery with distention, decreased appetite and decreased BM/flatus.  Discussed H&P concerning for partial bowel obstruction.  Recommended ER for further evaluation.  Discharged in stable condition. Final Clinical Impressions(s) / UC Diagnoses   Final diagnoses:  Generalized abdominal pain  Decreased appetite  Abdominal distension  History of abdominal surgery   Discharge Instructions   None    ED Prescriptions  None     PDMP not reviewed this encounter.   Hall[place], [place], [place] 12/31/19 2156

## 2019-12-31 NOTE — ED Triage Notes (Signed)
Pt c/o upper abdominal pain shooting through to back, up shoulders, and to both arms, with abdomen bloating since thanksgiving. States been taking gas x with some relief. States this happens after she eats. States has some relief after having a BM. States today pain is constant, hasn't ate anything today just drinking water. States hx of IBS and having diarrhea is normal for her. Denies vomiting.

## 2020-01-02 ENCOUNTER — Observation Stay (HOSPITAL_COMMUNITY)
Admission: EM | Admit: 2020-01-02 | Discharge: 2020-01-03 | Disposition: A | Discharge status: Home / Self Care | Payer: Medicare Other | Attending: Emergency Medicine | Admitting: Emergency Medicine

## 2020-01-02 ENCOUNTER — Encounter (HOSPITAL_COMMUNITY): Payer: Self-pay | Admitting: Physician Assistant

## 2020-01-02 ENCOUNTER — Other Ambulatory Visit: Payer: Self-pay

## 2020-01-02 ENCOUNTER — Emergency Department (HOSPITAL_COMMUNITY): Payer: Medicare Other

## 2020-01-02 ENCOUNTER — Observation Stay (HOSPITAL_BASED_OUTPATIENT_CLINIC_OR_DEPARTMENT_OTHER): Payer: Medicare Other

## 2020-01-02 DIAGNOSIS — R079 Chest pain, unspecified: Secondary | ICD-10-CM | POA: Diagnosis not present

## 2020-01-02 DIAGNOSIS — N281 Cyst of kidney, acquired: Secondary | ICD-10-CM | POA: Diagnosis not present

## 2020-01-02 DIAGNOSIS — J449 Chronic obstructive pulmonary disease, unspecified: Secondary | ICD-10-CM | POA: Diagnosis not present

## 2020-01-02 DIAGNOSIS — I4819 Other persistent atrial fibrillation: Secondary | ICD-10-CM | POA: Diagnosis present

## 2020-01-02 DIAGNOSIS — R1110 Vomiting, unspecified: Secondary | ICD-10-CM | POA: Diagnosis not present

## 2020-01-02 DIAGNOSIS — I4891 Unspecified atrial fibrillation: Secondary | ICD-10-CM | POA: Diagnosis not present

## 2020-01-02 DIAGNOSIS — D259 Leiomyoma of uterus, unspecified: Secondary | ICD-10-CM | POA: Diagnosis not present

## 2020-01-02 DIAGNOSIS — I499 Cardiac arrhythmia, unspecified: Secondary | ICD-10-CM | POA: Diagnosis not present

## 2020-01-02 DIAGNOSIS — F17210 Nicotine dependence, cigarettes, uncomplicated: Secondary | ICD-10-CM | POA: Insufficient documentation

## 2020-01-02 DIAGNOSIS — E039 Hypothyroidism, unspecified: Secondary | ICD-10-CM | POA: Diagnosis not present

## 2020-01-02 DIAGNOSIS — I2510 Atherosclerotic heart disease of native coronary artery without angina pectoris: Secondary | ICD-10-CM | POA: Insufficient documentation

## 2020-01-02 DIAGNOSIS — Z7901 Long term (current) use of anticoagulants: Secondary | ICD-10-CM | POA: Insufficient documentation

## 2020-01-02 DIAGNOSIS — R140 Abdominal distension (gaseous): Secondary | ICD-10-CM | POA: Diagnosis not present

## 2020-01-02 DIAGNOSIS — Z79899 Other long term (current) drug therapy: Secondary | ICD-10-CM | POA: Diagnosis not present

## 2020-01-02 DIAGNOSIS — K7689 Other specified diseases of liver: Secondary | ICD-10-CM | POA: Diagnosis not present

## 2020-01-02 DIAGNOSIS — E161 Other hypoglycemia: Secondary | ICD-10-CM | POA: Diagnosis not present

## 2020-01-02 DIAGNOSIS — I519 Heart disease, unspecified: Secondary | ICD-10-CM | POA: Diagnosis present

## 2020-01-02 DIAGNOSIS — I10 Essential (primary) hypertension: Secondary | ICD-10-CM | POA: Diagnosis present

## 2020-01-02 DIAGNOSIS — Z720 Tobacco use: Secondary | ICD-10-CM | POA: Diagnosis present

## 2020-01-02 DIAGNOSIS — Z20822 Contact with and (suspected) exposure to covid-19: Secondary | ICD-10-CM | POA: Diagnosis not present

## 2020-01-02 DIAGNOSIS — I255 Ischemic cardiomyopathy: Secondary | ICD-10-CM | POA: Diagnosis present

## 2020-01-02 DIAGNOSIS — R109 Unspecified abdominal pain: Secondary | ICD-10-CM | POA: Diagnosis not present

## 2020-01-02 DIAGNOSIS — R0789 Other chest pain: Secondary | ICD-10-CM | POA: Diagnosis not present

## 2020-01-02 DIAGNOSIS — E162 Hypoglycemia, unspecified: Secondary | ICD-10-CM | POA: Diagnosis not present

## 2020-01-02 DIAGNOSIS — Z951 Presence of aortocoronary bypass graft: Secondary | ICD-10-CM

## 2020-01-02 DIAGNOSIS — R7989 Other specified abnormal findings of blood chemistry: Secondary | ICD-10-CM | POA: Diagnosis present

## 2020-01-02 DIAGNOSIS — R778 Other specified abnormalities of plasma proteins: Secondary | ICD-10-CM | POA: Diagnosis present

## 2020-01-02 HISTORY — PX: TRANSTHORACIC ECHOCARDIOGRAM: SHX275

## 2020-01-02 LAB — RESP PANEL BY RT-PCR (FLU A&B, COVID) ARPGX2
Influenza A by PCR: NEGATIVE
Influenza B by PCR: NEGATIVE
SARS Coronavirus 2 by RT PCR: NEGATIVE

## 2020-01-02 LAB — BASIC METABOLIC PANEL WITH GFR
Anion gap: 13 (ref 5–15)
BUN: 15 mg/dL (ref 8–23)
CO2: 17 mmol/L — ABNORMAL LOW (ref 22–32)
Calcium: 8.8 mg/dL — ABNORMAL LOW (ref 8.9–10.3)
Chloride: 107 mmol/L (ref 98–111)
Creatinine, Ser: 0.94 mg/dL (ref 0.44–1.00)
GFR, Estimated: >60 mL/min (ref >60)
Glucose, Bld: 82 mg/dL (ref 70–99)
Potassium: 3.7 mmol/L (ref 3.5–5.1)
Sodium: 137 mmol/L (ref 135–145)

## 2020-01-02 LAB — ECHOCARDIOGRAM COMPLETE
Area-P 1/2: 4.21 cm2
Calc EF: 46 %
Height: 61 in
S' Lateral: 3.2 cm
Single Plane A2C EF: 43.7 %
Single Plane A4C EF: 46.1 %
Weight: 2127 oz

## 2020-01-02 LAB — CBC
HCT: 43.6 % (ref 36.0–46.0)
Hemoglobin: 14.5 g/dL (ref 12.0–15.0)
MCH: 31.2 pg (ref 26.0–34.0)
MCHC: 33.3 g/dL (ref 30.0–36.0)
MCV: 93.8 fL (ref 80.0–100.0)
Platelets: 218 10*3/uL (ref 150–400)
RBC: 4.65 MIL/uL (ref 3.87–5.11)
RDW: 12.6 % (ref 11.5–15.5)
WBC: 9.3 10*3/uL (ref 4.0–10.5)
nRBC: 0 % (ref 0.0–0.2)

## 2020-01-02 LAB — HEPATIC FUNCTION PANEL
ALT: 28 U/L (ref 0–44)
AST: 29 U/L (ref 15–41)
Albumin: 3.7 g/dL (ref 3.5–5.0)
Alkaline Phosphatase: 64 U/L (ref 38–126)
Bilirubin, Direct: 0.2 mg/dL (ref 0.0–0.2)
Indirect Bilirubin: 0.7 mg/dL (ref 0.3–0.9)
Total Bilirubin: 0.9 mg/dL (ref 0.3–1.2)
Total Protein: 6.6 g/dL (ref 6.5–8.1)

## 2020-01-02 LAB — PROTIME-INR
INR: 1.7 — ABNORMAL HIGH (ref 0.8–1.2)
Prothrombin Time: 19 s — ABNORMAL HIGH (ref 11.4–15.2)

## 2020-01-02 LAB — TROPONIN I (HIGH SENSITIVITY)
Troponin I (High Sensitivity): 504 ng/L (ref <18)
Troponin I (High Sensitivity): 649 ng/L (ref <18)
Troponin I (High Sensitivity): 1309 ng/L (ref <18)

## 2020-01-02 LAB — MAGNESIUM: Magnesium: 1.8 mg/dL (ref 1.7–2.4)

## 2020-01-02 LAB — LIPASE, BLOOD: Lipase: 25 U/L (ref 11–51)

## 2020-01-02 LAB — TSH: TSH: 1.139 u[IU]/mL (ref 0.350–4.500)

## 2020-01-02 MED ORDER — FENTANYL CITRATE (PF) 100 MCG/2ML IJ SOLN
50.0000 ug | Freq: Once | INTRAMUSCULAR | Status: AC
  Administered 2020-01-02: 50 ug via INTRAVENOUS
  Filled 2020-01-02: qty 2

## 2020-01-02 MED ORDER — SODIUM CHLORIDE 0.9 % IV BOLUS
500.0000 mL | Freq: Once | INTRAVENOUS | Status: AC
  Administered 2020-01-02: 500 mL via INTRAVENOUS

## 2020-01-02 MED ORDER — DILTIAZEM HCL-DEXTROSE 125-5 MG/125ML-% IV SOLN (PREMIX)
5.0000 mg/h | INTRAVENOUS | Status: DC
  Administered 2020-01-02: 5 mg/h via INTRAVENOUS
  Filled 2020-01-02: qty 125

## 2020-01-02 MED ORDER — NICOTINE 14 MG/24HR TD PT24
14.0000 mg | MEDICATED_PATCH | Freq: Every day | TRANSDERMAL | Status: DC
  Administered 2020-01-02 – 2020-01-03 (×2): 14 mg via TRANSDERMAL
  Filled 2020-01-02 (×2): qty 1

## 2020-01-02 MED ORDER — HYOSCYAMINE SULFATE ER 0.375 MG PO TB12
0.3750 mg | ORAL_TABLET | Freq: Two times a day (BID) | ORAL | Status: DC
  Administered 2020-01-02 – 2020-01-03 (×2): 0.375 mg via ORAL
  Filled 2020-01-02 (×4): qty 1

## 2020-01-02 MED ORDER — RIVAROXABAN 20 MG PO TABS
20.0000 mg | ORAL_TABLET | Freq: Every day | ORAL | Status: DC
  Administered 2020-01-02: 20 mg via ORAL
  Filled 2020-01-02 (×2): qty 1

## 2020-01-02 MED ORDER — ROSUVASTATIN CALCIUM 20 MG PO TABS
20.0000 mg | ORAL_TABLET | Freq: Every day | ORAL | Status: DC
  Administered 2020-01-02 – 2020-01-03 (×2): 20 mg via ORAL
  Filled 2020-01-02 (×2): qty 1

## 2020-01-02 MED ORDER — LORATADINE 10 MG PO TABS
10.0000 mg | ORAL_TABLET | Freq: Every day | ORAL | Status: DC
  Administered 2020-01-02 – 2020-01-03 (×2): 10 mg via ORAL
  Filled 2020-01-02 (×2): qty 1

## 2020-01-02 MED ORDER — CALCIUM CARBONATE ANTACID 500 MG PO CHEW
500.0000 mg | CHEWABLE_TABLET | Freq: Every day | ORAL | Status: DC
  Administered 2020-01-03: 500 mg via ORAL
  Filled 2020-01-02: qty 3

## 2020-01-02 MED ORDER — NITROGLYCERIN 0.4 MG SL SUBL: 0.4000 mg | SUBLINGUAL_TABLET | SUBLINGUAL | Status: DC | PRN

## 2020-01-02 MED ORDER — ZOLPIDEM TARTRATE 5 MG PO TABS: 5.0000 mg | ORAL_TABLET | Freq: Every evening | ORAL | Status: DC | PRN

## 2020-01-02 MED ORDER — METOPROLOL TARTRATE 50 MG PO TABS
50.0000 mg | ORAL_TABLET | Freq: Two times a day (BID) | ORAL | Status: DC
  Administered 2020-01-02 – 2020-01-03 (×2): 50 mg via ORAL
  Filled 2020-01-02: qty 1
  Filled 2020-01-02: qty 2
  Filled 2020-01-02: qty 1

## 2020-01-02 MED ORDER — ASPIRIN 81 MG PO CHEW
324.0000 mg | CHEWABLE_TABLET | Freq: Once | ORAL | Status: AC
  Administered 2020-01-02: 324 mg via ORAL
  Filled 2020-01-02: qty 4

## 2020-01-02 MED ORDER — ALPRAZOLAM 0.25 MG PO TABS: 0.2500 mg | ORAL_TABLET | Freq: Two times a day (BID) | ORAL | Status: DC | PRN

## 2020-01-02 MED ORDER — SODIUM CHLORIDE 0.9% FLUSH: 3.0000 mL | INTRAVENOUS | Status: DC | PRN

## 2020-01-02 MED ORDER — SODIUM CHLORIDE 0.9 % IV BOLUS
1000.0000 mL | Freq: Once | INTRAVENOUS | Status: AC
  Administered 2020-01-02: 1000 mL via INTRAVENOUS

## 2020-01-02 MED ORDER — SODIUM CHLORIDE 0.9 % IV SOLN: 250.0000 mL | INTRAVENOUS | Status: DC | PRN

## 2020-01-02 MED ORDER — RISAQUAD PO CAPS
1.0000 | ORAL_CAPSULE | Freq: Every day | ORAL | Status: DC
  Administered 2020-01-03: 1 via ORAL
  Filled 2020-01-02 (×2): qty 1

## 2020-01-02 MED ORDER — SODIUM CHLORIDE 0.9% FLUSH
3.0000 mL | Freq: Two times a day (BID) | INTRAVENOUS | Status: DC
  Administered 2020-01-02 – 2020-01-03 (×2): 3 mL via INTRAVENOUS

## 2020-01-02 MED ORDER — LEVOTHYROXINE SODIUM 75 MCG PO TABS
75.0000 ug | ORAL_TABLET | Freq: Every day | ORAL | Status: DC
  Administered 2020-01-03: 75 ug via ORAL
  Filled 2020-01-02: qty 1

## 2020-01-02 MED ORDER — ONDANSETRON HCL 4 MG/2ML IJ SOLN: 4.0000 mg | Freq: Four times a day (QID) | INTRAMUSCULAR | Status: DC | PRN

## 2020-01-02 MED ORDER — ACETAMINOPHEN 325 MG PO TABS: 650.0000 mg | ORAL_TABLET | ORAL | Status: DC | PRN

## 2020-01-02 NOTE — ED Provider Notes (Signed)
7:20 AM Patient seen in conjunction with Badalamente PA-C.   Presents with several days of epigastric abd pain, nausea, decreased appetite. She also reports non-bloody diarrhea (not atypical for her IBS). Has been taking Maalox. H/o CABG, afib on anticoagulation (states compliance despite her symptoms). No vomiting or fever.   She will need imaging, currently awaiting remainder of labs. She also has IV contrast allergy.  In afib with RVR today but does not look like she is in heart failure currently.   7:30 AM CBC, CMP, lipase OK. Will obtain CT non-contrast. Doubt sepsis at this point.      Renne Crigler, PA-C 01/02/20 1348    Pricilla Loveless, MD 01/02/20 [phone]

## 2020-01-02 NOTE — Progress Notes (Signed)
  Echocardiogram 2D Echocardiogram has been performed.  Dominique Barber 01/02/2020, 2:01 PM

## 2020-01-02 NOTE — Consult Note (Addendum)
Error

## 2020-01-02 NOTE — ED Triage Notes (Addendum)
Pt bib ems for generalizd abd and n/v past 2 days. Patient sat up in bed earlier this morning and was co chest pain, sob, diaphoretic, and pale. Patient a&0x4. Hx of afib, takes thinners and metoprolol. Squad gave 500cc of fluid and hr decreased to 120-140.

## 2020-01-02 NOTE — ED Provider Notes (Addendum)
[facility] Provider [facility]   CSN: [id number] Arrival date & time: 01/02/20  [id number]     History Chief Complaint  Patient presents with  . Atrial Fibrillation    RVR  . Abdominal Pain  . Chest Pain    Dominique BaleHazel C Barber is a 64 y.o. female with a history of anterior STEMI (1998 s/p CABG), CAD, atrial fibrillation (on metoprolol and Xarelto), COPD, hypertension, GERD, tobacco use disorder.  Patient presents with a chief relaxer abdominal pain.  Patient reports that her pain began off and on last Friday (12/24) however became worse this Monday (12/27).  Patient reports that pain is located in her right upper quadrant and radiates across to her left side, pain is intermittent, lasts for approximately 10 minutes with the relief for about an hour before it returns.  Patient describes the pain as a aching cramping sensation.  At present patient complains of epigastric discomfort.  Patient endorses diarrhea and feeling of early satiety.  Patient reports that not all of her stools have been running and she has been able to perform loose stool.  Patient  has been able to tolerate fluids and p.o. oral intake.  Patient denies any fever, chills, nausea, vomiting, constipation, melena, bloody stool or post prandial pain.  Patient reports that she has had appendix and gallbladder removed.  Other abdominal surgeries include cesarean section x3.    Patient reports history of atrial fibrillation.  Reports that she takes her metoprolol and Xarelto as prescribed.  Patient reports that she started feeling palpitations this morning around 4 AM.  Patient denies any chest pain or discomfort.    HPI     Past Medical History:  Diagnosis Date  . CAD in native artery 1998   Followup 1 month post MI revealed 95% ISR in PTCA site --> PCI with 3.0 mm x 25 mm BMS stent in proximal LAD --> 5 months later recurrent ISR of LAD BMS that compromised D1 --> referred for CABG .--> Emergent  redo single vessel CABG with SVG-mLAD for occluded LIMA  . COPD (chronic obstructive pulmonary disease) (HCC)   . GERD (gastroesophageal reflux disease)   . Hyperlipidemia   . Hypothyroidism    On Synthroid  . Iron deficiency anemia   . Irritable bowel syndrome    Followed by Dr. Charna ElizabethJyothi Mann.  Marland Kitchen. PAF (paroxysmal atrial fibrillation) (HCC) 05/03/2014   New onset - originally with RVR  . S/P CABG x 2 07/1996   Initially LIMA-LAD, SVG-D1 --> immediate LIMA failure post CABG with anterior MI --> urgent redo SVG-LAD beyond D2.; Widely patent grafts as of 2005 with left dominant system. Graft to the diagonal branch has a very small target vessel but the vein graft to LAD retrograde fills a large bifurcating D2 and antegrade fills a large D3.  . ST elevation (STEMI) myocardial infarction involving left anterior descending coronary artery (HCC) 01/19/1996   Complicated by VF arrest, and anoxic brain injury with status epilepticus; POBA of LAD --> 6 months later, after 2 vessel CABG she had postop complication with occluded LIMA/second anterior STEMI  . Tobacco abuse     Patient Active Problem List   Diagnosis Date Noted  . Atrial fibrillation with rapid ventricular response (HCC) 01/02/2020  . Essential hypertension 06/14/2019  . Persistent atrial fibrillation with rapid ventricular response (HCC) 05/14/2014  . Paroxysmal atrial fibrillation (HCC); CHA2DS2-VASc Score 3. On Xarelto 05/04/2014  . COPD (chronic obstructive pulmonary disease) (HCC) 05/04/2014  . Hyperlipidemia  with target LDL less than 70   . Tobacco abuse   . Hypothyroid 12/03/2012  . Multinodular goiter 12/03/2012  . CAD- 100% LAD, s/p SVG-LAD after failed LIMA-LAD 11/14/1996    Class: Chronic  . S/P CABG x 2 07/03/1996  . ST elevation (STEMI) myocardial infarction involving left anterior descending coronary artery ([place]) 01/19/1996    Past Surgical History:  Procedure Laterality Date  . APPENDECTOMY    . BREAST BIOPSY Right    . CARDIAC CATHETERIZATION  07/30/1996   normal L main, LAD w/95% stenosis just before stent, diffuse 80-85% end-stent restenosis, 1st diagonal with70-75% ostial stenosis, large optional diagonal that was normal, Cfx with 2 marginals and distal PLA (all normal), RCA normal (Dr. Marella Chimes)  . CARDIAC CATHETERIZATION  01/31/2003   LAD totally occluded in prox 3rd, patent Cfx, SVG to mid LAd patent, SVG to small DX1 patent, LIMA to LAD was atretic and essentially occluded in junction of prox & mid 3rd, R barciocephalic and subclavian were normal (Dr. Marella Chimes)  . CESAREAN SECTION    . CHOLECYSTECTOMY    . CORONARY ANGIOPLASTY  01/19/1996   acute anterior wall MI, anoxic encephalopahty, VF; LCfx free of disease and dominant, RCA patent, L main short and patent, LAD with 70-80% narrowing and focal 95% stenosis in distal 3rd with balloon angioplasty (Dr. Marella Chimes)  . CORONARY ANGIOPLASTY WITH STENT PLACEMENT  02/26/1996   3.0x71mm Multi-Link stent to prox/mid LAD; L main normal & short, Cfx dominant and normal, PDA & PLA normal, RCA non-dominant with small PLA and normal (Dr. Marella Chimes)  . CORONARY ARTERY BYPASS GRAFT  07/31/1996   x2; LIMA to LAD and SVG to 1st diagonal (Dr. Remus Loffler)  . CORONARY ARTERY BYPASS GRAFT  07/31/1996   x1; SVG to distal LAD (Dr. Remus Loffler)  . NM MYOCAR PERF WALL MOTION  10/2010; 06/2014   a) LexiScan Cardiolite - mild perfusion defect r/t infarct/scar with mild periifarct ischemia in mid anterior & apical anterior region; EF 67%; abnormal, low risk study;; b) Small, moderate intensity Fixed perfusion defect - mid Anterior Wall c/w known Anterior MI. LOW RISK   . OVARIAN CYST REMOVAL    . TRANSTHORACIC ECHOCARDIOGRAM  02/2012; 06/2014   a) EF 50-55%, mild HK of anterospetal myocardium; mild MR;; b) EF 55-60%, mild HK of apical anterior wall.   Mild MR     OB History   No obstetric history on file.     Family History  Problem Relation Age of Onset  . Heart failure  Other   . Diabetes Other   . Diabetes Mother   . Breast cancer Mother   . Thyroid disease Paternal Aunt   . Thyroid disease Maternal Grandmother     Social History   Tobacco Use  . Smoking status: Current Some Day Smoker    Packs/day: 0.25    Types: Cigarettes  . Smokeless tobacco: Former Systems developer  . Tobacco comment: Quit at time of MI, restarted in 2009  Substance Use Topics  . Alcohol use: No    Alcohol/week: 0.0 standard drinks    Home Medications Prior to Admission medications   Medication Sig Start Date End Date Taking? Authorizing Provider  acetaminophen (TYLENOL) 500 MG tablet Take 500 mg by mouth every 6 (six) hours as needed for mild pain.   Yes [provider]  alum & mag hydroxide-simeth (MAALOX/MYLANTA) 200-200-20 MG/5ML suspension Take 15-30 mLs by mouth every 6 (six) hours as needed for indigestion or  flatulence.   Yes [provider]  Calcium Carb-Cholecalciferol (CALCIUM+D3 PO) Take 1 tablet by mouth daily with breakfast.   Yes [provider]  cetirizine (ZYRTEC) 10 MG tablet TAKE 1 TABLET DAILY Patient taking differently: Take 10 mg by mouth daily. 11/27/17  Yes Reather Littler, MD  levothyroxine (SYNTHROID) 50 MCG tablet TAKE ONE AND ONE-HALF TABLETS DAILY BEFORE BREAKFAST Patient taking differently: Take 75 mcg by mouth daily before breakfast. TAKE ONE AND ONE-HALF TABLETS DAILY BEFORE BREAKFAST 06/17/19  Yes Reather Littler, MD  metoprolol tartrate (LOPRESSOR) 25 MG tablet TAKE ONE AND ONE-HALF TABLETS TWICE A DAY Patient taking differently: Take 37.5 mg by mouth in the morning and at bedtime. 10/22/19  Yes Marykay Lex, MD  NON FORMULARY Take 1 capsule by mouth See admin instructions. Renew Life Women's Probiotics- Take 1 capsule by mouth once a day   Yes [provider]  NON FORMULARY Take 1 capsule by mouth See admin instructions. NOW Supplements/Peppermint Gels with Ginger & Fennel Oils, Enteric Coated, Digestive Support- Take 1  capsule by mouth before each meal   Yes [provider]  rivaroxaban (XARELTO) 20 MG TABS tablet Take 1 tablet (20 mg total) by mouth daily with supper. Patient taking differently: Take 20 mg by mouth daily. 12/25/18  Yes Jodelle Gross, NP  rosuvastatin (CRESTOR) 20 MG tablet Take 1 tablet (20 mg total) by mouth daily. 12/25/18  Yes Jodelle Gross, NP  simethicone (MYLICON) 125 MG chewable tablet Chew 125 mg by mouth See admin instructions. Chew 125 mg by mouth three times a day after meals as needed for gas   Yes [provider]  SYMAX-SR 0.375 MG 12 hr tablet Take 0.375 mg by mouth 2 (two) times daily. 12/14/19  Yes [provider]  potassium chloride (KLOR-CON) 10 MEQ tablet TAKE AS INSTRUCTED BY YOUR PRESCRIBER Patient taking differently: Take 10 mEq by mouth at bedtime. 12/10/19   Marykay Lex, MD    Allergies    Atorvastatin, Eluxadoline, Iohexol, and Red dye  Review of Systems   Review of Systems  Constitutional: Negative for chills and fever.  Eyes: Negative for visual disturbance.  Respiratory: Negative for shortness of breath.   Cardiovascular: Positive for palpitations. Negative for chest pain and leg swelling.  Gastrointestinal: Positive for abdominal pain and diarrhea. Negative for abdominal distention, blood in stool, constipation, nausea and vomiting.  Genitourinary: Negative for difficulty urinating and dysuria.  Musculoskeletal: Negative for back pain and neck pain.  Skin: Negative for color change and rash.  Neurological: Negative for dizziness, syncope, light-headedness and headaches.  Psychiatric/Behavioral: Negative for confusion.    Physical Exam Updated Vital Signs BP 128/75   Pulse 62   Temp (!) 97 F (36.1 C) (Temporal)   Resp 20   Ht 5\' 1"  (1.549 m)   Wt 60.3 kg   SpO2 100%   BMI 25.12 kg/m   Physical Exam Vitals and nursing note reviewed.  Constitutional:      General: She is not in acute distress.     Appearance: She is not ill-appearing, toxic-appearing or diaphoretic.  HENT:     Head: Normocephalic.  Eyes:     General: No scleral icterus.       Right eye: No discharge.        Left eye: No discharge.  Cardiovascular:     Rate and Rhythm: Tachycardia present. Rhythm irregular.     Heart sounds: Normal heart sounds.  Pulmonary:  Effort: Pulmonary effort is normal.     Breath sounds: Normal breath sounds.  Abdominal:     General: Bowel sounds are normal.     Palpations: Abdomen is soft. There is no mass or pulsatile mass.     Tenderness: There is abdominal tenderness in the epigastric area. There is no guarding or rebound.  Musculoskeletal:     Cervical back: Neck supple.     Right lower leg: No edema.     Left lower leg: No edema.  Skin:    General: Skin is warm and dry.     Coloration: Skin is not jaundiced or pale.  Neurological:     General: No focal deficit present.     Mental Status: She is alert.  Psychiatric:        Behavior: Behavior is cooperative.     ED Results / Procedures / Treatments   Labs (all labs ordered are listed, but only abnormal results are displayed) Labs Reviewed  BASIC METABOLIC PANEL - Abnormal; Notable for the following components:      Result Value   CO2 17 (*)    Calcium 8.8 (*)    All other components within normal limits  PROTIME-INR - Abnormal; Notable for the following components:   Prothrombin Time 19.0 (*)    INR 1.7 (*)    All other components within normal limits  TROPONIN I (HIGH SENSITIVITY) - Abnormal; Notable for the following components:   Troponin I (High Sensitivity) 504 (*)    All other components within normal limits  TROPONIN I (HIGH SENSITIVITY) - Abnormal; Notable for the following components:   Troponin I (High Sensitivity) 649 (*)    All other components within normal limits  RESP PANEL BY RT-PCR (FLU A&B, COVID) ARPGX2  CBC  HEPATIC FUNCTION PANEL  LIPASE, BLOOD  MAGNESIUM  TSH  TROPONIN I (HIGH  SENSITIVITY)    EKG EKG Interpretation  Date/Time:  Thursday January 02 2020 08:50:56 EST Ventricular Rate:  128 PR Interval:    QRS Duration: 81 QT Interval:  337 QTC Calculation: 506 R Axis:   49 Text Interpretation: Atrial fibrillation Probable anterior infarct, age indeterminate Lateral leads are also involved Prolonged QT interval Baseline wander in lead(s) I II aVR nonspecific ST/T changes, likely rate related Confirmed by Sherwood Gambler [phone]) on 01/02/2020 9:07:09 AM   Radiology CT Abdomen Pelvis Wo Contrast  Result Date: 01/02/2020 CLINICAL DATA:  Nausea and vomiting, epigastric pain EXAM: CT ABDOMEN AND PELVIS WITHOUT CONTRAST TECHNIQUE: Multidetector CT imaging of the abdomen and pelvis was performed following the standard protocol without IV contrast. COMPARISON:  Most recent imaging is from 2006 FINDINGS: Lower chest: No acute abnormality. Hepatobiliary: Too small to characterize low-attenuation lesion of the right hepatic lobe may reflect a small cyst. Cholecystectomy. No biliary dilatation. Pancreas: Unremarkable. Spleen: Unremarkable. Adrenals/Urinary Tract: Adrenals are unremarkable. Exophytic cyst of the interpolar region of the left kidney. Bladder is moderately distended and unremarkable. Stomach/Bowel: Stomach is within normal limits. Bowel is normal in caliber. Vascular/Lymphatic: Aortic atherosclerosis. No enlarged lymph nodes identified. Reproductive: Small uterine calcification at this site of fibroid on the prior study. No adnexal mass. Other: No ascites.  Abdominal wall is unremarkable. Musculoskeletal: No acute osseous abnormality. IMPRESSION: No acute abnormality or findings to account for reported symptoms. Electronically Signed   By: Macy Mis M.D.   On: 01/02/2020 09:31   DG Chest Port 1 View  Result Date: 01/02/2020 CLINICAL DATA:  64 year old female with chest pain and abdominal bloating.  EXAM: PORTABLE CHEST 1 VIEW COMPARISON:  Portable chest  05/04/2014 and earlier. FINDINGS: Portable AP upright view at 0657 hours. Chronic large lung volumes. Prior CABG. Normal cardiac size and mediastinal contours. Visualized tracheal air column is within normal limits. Allowing for portable technique the lungs are clear. No pneumothorax. No acute osseous abnormality identified. IMPRESSION: Chronic pulmonary hyperinflation and prior CABG. No acute cardiopulmonary abnormality. Electronically Signed   By: Genevie Ann M.D.   On: 01/02/2020 07:06    Procedures .Critical Care Performed by: Loni Beckwith, PA-C Authorized by: Loni Beckwith, PA-C   Critical care provider statement:    Critical care time (minutes):  45   Critical care was necessary to treat or prevent imminent or life-threatening deterioration of the following conditions:  Cardiac failure   Critical care was time spent personally by me on the following activities:  Discussions with consultants, evaluation of patient's response to treatment, examination of patient, ordering and performing treatments and interventions, ordering and review of laboratory studies, ordering and review of radiographic studies, pulse oximetry, re-evaluation of patient's condition, obtaining history from patient or surrogate and review of old charts Comments:     Atrial fibrillation with RVR, requiring diltiazem infusion for rate control   (including critical care time)  Medications Ordered in ED Medications  nitroGLYCERIN (NITROSTAT) SL tablet 0.4 mg (has no administration in time range)  acetaminophen (TYLENOL) tablet 650 mg (has no administration in time range)  ondansetron (ZOFRAN) injection 4 mg (has no administration in time range)  zolpidem (AMBIEN) tablet 5 mg (has no administration in time range)  sodium chloride flush (NS) 0.9 % injection 3 mL (has no administration in time range)  sodium chloride flush (NS) 0.9 % injection 3 mL (has no administration in time range)  0.9 %  sodium chloride  infusion (has no administration in time range)  ALPRAZolam (XANAX) tablet 0.25 mg (has no administration in time range)  calcium carbonate (TUMS - dosed in mg elemental calcium) chewable tablet 500 mg (has no administration in time range)  loratadine (CLARITIN) tablet 10 mg (10 mg Oral Given 01/02/20 1247)  hyoscyamine (LEVBID) 0.375 MG 12 hr tablet 0.375 mg (has no administration in time range)  levothyroxine (SYNTHROID) tablet 75 mcg (has no administration in time range)  metoprolol tartrate (LOPRESSOR) tablet 50 mg (0 mg Oral Hold 01/02/20 1246)  acidophilus (RISAQUAD) capsule 1 capsule (has no administration in time range)  rivaroxaban (XARELTO) tablet 20 mg (has no administration in time range)  rosuvastatin (CRESTOR) tablet 20 mg (20 mg Oral Given 01/02/20 1248)  sodium chloride 0.9 % bolus 1,000 mL (0 mLs Intravenous Stopped 01/02/20 0831)  fentaNYL (SUBLIMAZE) injection 50 mcg (50 mcg Intravenous Given 01/02/20 0725)  aspirin chewable tablet 324 mg (324 mg Oral Given 01/02/20 0826)  sodium chloride 0.9 % bolus 500 mL (0 mLs Intravenous Stopped 01/02/20 1144)    ED Course  I have reviewed the triage vital signs and the nursing notes.  Pertinent labs & imaging results that were available during my care of the patient were reviewed by me and considered in my medical decision making (see chart for details).    MDM Rules/Calculators/A&P                          Alert 64 year old female no acute distress, nontoxic appearing.  Patient presents with chief complaint of abdominal pain since 12/24 which has become worse on 12/27.  Patient has had her appendix  and gallbladder removed.  History of cesarean section x3.  At present patient complains of epigastric pain.  Abdomen is nondistended, soft, normoactive bowel sounds, tenderness to epigastric region, no mass.    Concern for pancreatitis, lipase ordered.  Less concern for small bowel obstruction as no active bowel sounds and patient has  been able to form loose stool, as well as no nausea or vomiting.  Considered bowel ischemia however no postprandial pain, also anticoagulated on Xarelto.  With history of MI and epigastric pain,  troponin ordered.  No SOB, leg swelling, history of DVT/PE, recent surgery, recent immobilization, cancer.  Possible gastric ulcer disease.  Noncontrast CT scan ordered as patient has allergy to contrast dye.  CBC is within normal limits makes GI bleed less likely.  Lipase within normal limits makes pancreatitis less likely.  Hepatic function within normal limits.  Chest x-ray shows no acute cardiopulmonary disease, as well as no free air; less concern for esophageal or gastric perforation.  COVID-19 and influenza negative.    Patient is found to be in atrial fibrillation with rate from 120-140.  Patient reports that she felt palpitations began at 0400 this morning.  Patient denies any shortness of breath, lightheadedness, dizziness or syncope.  Patient is not in any acute respiratory distress.  Patient given 1 L fluid bolus.  If no source for tachycardia can be found and patient remains tachy despite fluids and pain control will need to rate control with cardioversion or medically.  Dr. Regenia Skeeter spoke with patient about possible cardioversion which patient refused.    0805 Informed by nurse that troponin is 504, this elevation is likel due to demand ischemia from being in a-fib.  Will repeat ekg and trend troponin.  Patient was given 324 ASA.    CT scan of abdomen pelvis shows no acute abnormality.    Patient still in atrial fibrillation with rate 110-140s.  Appropriately controlled with diltiazem infusion, patient was not given bolus due to her blood pressure.  Will consult cardiology.    10:00 Spoke with Social research officer, government.    Patient spontaneously converted to sinus rhythm.  After conversion patient reported resolution of her abdominal pain.  Repeat troponin elevated at 649.    Cardiology will admit the  patient for observation.  Patient is amenable to this plan.  Patient was discussed with and evaluated by Dr. Regenia Skeeter.  CHA2DS2/VAS Stroke Risk Points  Current as of 42 minutes ago     3 >= 2 Points: High Risk  1 - 1.99 Points: Medium Risk  0 Points: Low Risk    Last Change: N/A      Details    This score determines the patient's risk of having a stroke if the  patient has atrial fibrillation.       Points Metrics  0 Has Congestive Heart Failure:  No    Current as of 42 minutes ago  1 Has Vascular Disease:  Yes    Current as of 42 minutes ago  1 Has Hypertension:  Yes    Current as of 42 minutes ago  0 Age:  50    Current as of 42 minutes ago  0 Has Diabetes:  No    Current as of 42 minutes ago  0 Had Stroke:  No  Had TIA:  No  Had Thromboembolism:  No    Current as of 42 minutes ago  1 Female:  Yes    Current as of 42 minutes ago  Final Clinical Impression(s) / ED Diagnoses Final diagnoses:  Atrial fibrillation with RVR [facility])    Rx / DC Orders ED Discharge Orders    None       Loni Beckwith, PA-C 01/02/20 1358    Loni Beckwith, PA-C 01/02/20 1405    Sherwood Gambler, MD 01/02/20 1438

## 2020-01-02 NOTE — ED Notes (Signed)
Patient transported to CT 

## 2020-01-02 NOTE — H&P (Addendum)
Cardiology Admission History and Physical:   Patient ID: Dominique Barber; MRN: [id number]; DOB: Feb 12, 1955   Admission date: 01/02/2020  Primary Care Provider: Nicolette Bang, DO Primary Cardiologist: Dominique Hew, MD  Primary Electrophysiologist: None    Chief Complaint:  Atrial fib, RVR  Patient Profile:   Dominique Barber is a 64 y.o. female with a history of STEMI 1998 w/ VF arrest & anoxic brain injury w/ status epilepticus, s/p blow-by w/ BMS LAD 1 mo later. ISR>> CABG 1998 w/ LIMA-LAD & SVG-D1, AMI w/ emergent re-operation w/ SVG-LAD 1998, Barber, COPD, GERD, IBS, HTN, HLD, who is being seen today for the evaluation of Afib, RVR at the request of Dominique Barber.  History of Present Illness:   Dominique Barber was in her usual state of health until she ate a [employer] filet of fish sandwich last Friday.  She started noticing some GI symptoms that escalated and became much worse by Monday.  She has not been able to eat much in the way of solid food since then.  She has sipped on water and Jell-O.  She went to an urgent care but did not get any significant treatment.  She took her home OTC meds and had 6 bowel movements yesterday.  Today, shortly after waking, she felt her heart start to flutter.  She is aware of the fluttering and that her heart feels like it is out of rhythm and going fast.  Even though she has the fluttering, she is not having chest pain or shortness of breath from it.  No presyncope or syncope.  She has been checking her blood pressure this week.  Her blood pressure has been 130s-140s, as well as she can remember it.  She mentions the #96 and 43, but is not sure if that is oxygen saturation or her heart rate.  She has not missed any doses of her Xarelto, and is taking her medicines faithfully even through her GI issues.  Her activity level is not that high, she does not exercise.  However, she has to vacuum daily and sometimes twice a day because she  has [employer].  She is concerned about being shocked because she was shocked back in 6 when she had her heart attack.  This was followed by anoxic brain injury and status epilepticus.  She is concerned that the scar tissue on her heart will make her more likely to have a complication from the cardioversion.  Of note, she has palpitations on a regular basis.  However, they are self-limiting and do not last that long.  This is the first episode she has had in a long time that continued.   Past Medical History:  Diagnosis Date  . CAD in native artery 1998   Followup 1 month post MI revealed 95% ISR in PTCA site --> PCI with 3.0 mm x 25 mm BMS stent in proximal LAD --> 5 months later recurrent ISR of LAD BMS that compromised D1 --> referred for CABG .--> Emergent redo single vessel CABG with SVG-mLAD for occluded LIMA  . COPD (chronic obstructive pulmonary disease) (Dominique Barber)   . GERD (gastroesophageal reflux disease)   . Hyperlipidemia   . Hypothyroidism    On Synthroid  . Iron deficiency anemia   . Irritable bowel syndrome    Followed by Dominique Barber.  Dominique Kitchen Barber (paroxysmal atrial fibrillation) (Dominique Barber) 05/03/2014   New onset - originally with RVR  . S/P CABG x 2 07/1996   Initially LIMA-LAD, SVG-D1 -->  immediate LIMA failure post CABG with anterior MI --> urgent redo SVG-LAD beyond D2.; Widely patent grafts as of 2005 with left dominant system. Graft to the diagonal branch has a very small target vessel but the vein graft to LAD retrograde fills a large bifurcating D2 and antegrade fills a large D3.  . ST elevation (STEMI) myocardial infarction involving left anterior descending coronary artery (Dominique Barber) [id number]   Complicated by VF arrest, and anoxic brain injury with status epilepticus; POBA of LAD --> 6 months later, after 2 vessel CABG she had postop complication with occluded LIMA/second anterior STEMI  . Tobacco abuse     Past Surgical History:  Procedure Laterality Date  .  APPENDECTOMY    . BREAST BIOPSY Right   . CARDIAC CATHETERIZATION  07/30/1996   normal L main, LAD w/95% stenosis just before stent, diffuse 80-85% end-stent restenosis, 1st diagonal with70-75% ostial stenosis, large optional diagonal that was normal, Cfx with 2 marginals and distal PLA (all normal), RCA normal (Dominique Barber)  . CARDIAC CATHETERIZATION  01/31/2003   LAD totally occluded in prox 3rd, patent Cfx, SVG to mid LAd patent, SVG to small DX1 patent, LIMA to LAD was atretic and essentially occluded in junction of prox & mid 3rd, R barciocephalic and subclavian were normal (Dominique Barber)  . CESAREAN SECTION    . CHOLECYSTECTOMY    . CORONARY ANGIOPLASTY  01/19/1996   acute anterior wall MI, anoxic encephalopahty, VF; LCfx free of disease and dominant, RCA patent, L main short and patent, LAD with 70-80% narrowing and focal 95% stenosis in distal 3rd with balloon angioplasty (Dominique Barber)  . CORONARY ANGIOPLASTY WITH STENT PLACEMENT  02/26/1996   3.0x36mm Multi-Link stent to prox/mid LAD; L main normal & short, Cfx dominant and normal, PDA & PLA normal, RCA non-dominant with small PLA and normal (Dominique Barber)  . CORONARY ARTERY BYPASS GRAFT  07/31/1996   x2; LIMA to LAD and SVG to 1st diagonal (Dominique Barber)  . CORONARY ARTERY BYPASS GRAFT  07/31/1996   x1; SVG to distal LAD (Dominique Barber)  . NM MYOCAR PERF WALL MOTION  10/2010; 06/2014   a) LexiScan Cardiolite - mild perfusion defect r/t infarct/scar with mild periifarct ischemia in mid anterior & apical anterior region; EF 67%; abnormal, low risk study;; b) Small, moderate intensity Fixed perfusion defect - mid Anterior Wall c/w known Anterior MI. LOW RISK   . OVARIAN CYST REMOVAL    . TRANSTHORACIC ECHOCARDIOGRAM  02/2012; 06/2014   a) EF 50-55%, mild HK of anterospetal myocardium; mild MR;; b) EF 55-60%, mild HK of apical anterior wall.   Mild MR     Medications Prior to Admission: Prior to Admission medications   Medication  Sig Start Date End Date Taking? Authorizing Provider  CALCIUM PO Take 1 tablet by mouth daily.     [provider]  cetirizine (ZYRTEC) 10 MG tablet TAKE 1 TABLET DAILY 11/27/17   Elayne Snare, MD  hyoscyamine (LEVSIN, ANASPAZ) 0.125 MG tablet Take 0.125 mg by mouth 2 (two) times daily.     [provider]  levothyroxine (SYNTHROID) 50 MCG tablet TAKE ONE AND ONE-HALF TABLETS DAILY BEFORE BREAKFAST 06/17/19   Elayne Snare, MD  metoprolol tartrate (LOPRESSOR) 25 MG tablet TAKE ONE AND ONE-HALF TABLETS TWICE A DAY 10/22/19   Leonie Man, MD  potassium chloride (KLOR-CON) 10 MEQ tablet TAKE AS INSTRUCTED BY YOUR PRESCRIBER 12/10/19   Leonie Man, MD  Probiotic Product [facility]  PO) Take 1 capsule by mouth daily.     [provider]  rivaroxaban (XARELTO) 20 MG TABS tablet Take 1 tablet (20 mg total) by mouth daily with supper. 12/25/18   Lendon Colonel, NP  rosuvastatin (CRESTOR) 20 MG tablet Take 1 tablet (20 mg total) by mouth daily. 12/25/18   Lendon Colonel, NP     Allergies:    Allergies  Allergen Reactions  . Atorvastatin Other (See Comments)    Gas pain in abdomen with no relief  . Other Hives  . Iohexol Hives and Rash    Red dye  . Viberzi [Eluxadoline] Nausea And Vomiting    Social History:   Social History   Socioeconomic History  . Marital status: Married    Spouse name: Not on file  . Number of children: 3  . Years of education: Not on file  . Highest education level: Not on file  Occupational History    Employer: DISABLED  Tobacco Use  . Smoking status: Current Some Day Smoker    Packs/day: 0.25    Types: Cigarettes  . Smokeless tobacco: Former Systems developer  . Tobacco comment: Quit at time of MI, restarted in 2009  Substance and Sexual Activity  . Alcohol use: No    Alcohol/week: 0.0 standard drinks  . Drug use: Not on file  . Sexual activity: Not on file  Other Topics Concern  . Not on file  Social History Narrative   Married,  mother of 3 children (2 living sons age 51-26 and 30-31; her daughter was the victim of a murder that took place sometime around the time of the patient's MI. She was under significant amount of stress as her daughter had gone missing. She was never found. Finally the suspect was charged and convicted in 2009. She had sessile he stop smoking until that time frame when it brought back memories and she is now back to smoking a half pack a day.   She does not get routine exercise, but is active around the house and doing housecleaning chores.   She does not drink alcohol.   Social Determinants of Health   Financial Resource Strain: Not on file  Food Insecurity: Not on file  Transportation Needs: Not on file  Physical Activity: Not on file  Stress: Not on file  Social Connections: Not on file  Intimate Partner Violence: Not on file    Family History:  The patient's family history includes Breast cancer in her mother; Diabetes in her mother and another family member; Heart failure in an other family member; Thyroid disease in her maternal grandmother and paternal aunt.   The patient She indicated that her mother is deceased. She indicated that her maternal grandmother is deceased. She indicated that her daughter is deceased. She indicated that her paternal aunt is deceased. She indicated that the status of her other is unknown.    ROS:  Please see the history of present illness.  All other ROS reviewed and negative.     Physical Exam/Data:   Vitals:   01/02/20 1030 01/02/20 1045 01/02/20 1135 01/02/20 1140  BP: 99/69 101/75 108/77 (!) 106/91  Pulse: 72 77 74 73  Resp: 11 14 13 14   Temp:      TempSrc:      SpO2: 98% 98% 98% 98%  Weight:      Height:        Intake/Output Summary (Last 24 hours) at 01/02/2020 1152 Last data filed at 01/02/2020 1144  Gross per 24 hour  Intake 13.7 ml  Output --  Net 13.7 ml   Filed Weights   01/02/20 0612  Weight: 60.3 kg   Body mass index is  25.12 kg/m.   General:  Well nourished, well developed, female in no acute distress HEENT: normal Lymph: no adenopathy Neck: JVD -not elevated Endocrine:  No thryomegaly Vascular: No carotid bruits; 4/4 extremity pulses 2+  Cardiac:  normal S1, S2; rapid and irregular rate and rhythm; no murmur Lungs: Essentially clear bilaterally, no wheezing, rhonchi or rales  Abd: soft, tender, no hepatomegaly  Ext: no edema Musculoskeletal:  No deformities, BUE and BLE strength normal and equal Skin: warm and dry  Neuro:  CNs 2-12 intact, no focal abnormalities noted Psych:  Normal affect   EKG:  The EKG was personally reviewed and demonstrates: Atrial fibrillation, heart rate 128, no obvious ischemic changes Telemetry:  Telemetry was personally reviewed and demonstrates: Atrial fibrillation, RVR, heart rate 140s at times   CV studies:   MYOVIEW: 06/24/2014  The left ventricular ejection fraction is hyperdynamic (>65%).  Defect 1: There is a small defect of moderate severity present in the mid anterior location.  There was no ST segment deviation noted during stress.  Small mid anterior wall scar without ischemia. Low risk study  ECHO: 06/302016 - Left ventricle: The cavity size was normal. Systolic function was  normal. The estimated ejection fraction was in the range of 55%  to 60%. There is mild hypokinesis of the apicalanterior  myocardium.  - Aortic valve: Trileaflet; mildly thickened, mildly calcified  leaflets.  - Mitral valve: There was mild regurgitation.   CATH:  2005 CORONARY ARTERIES: The main left coronary was normal.  The LAD was totally occluded in the proximal third right after the first septal perforator branch. There was a large, optional diagonal branch thatbifurcated, was widely patent and smooth through its course.  The circumflex was a dominant vessel giving off a small first marginal, alarge bifurcating second marginal, and a large  third marginal from thedistal vessel. Large PDA and bifurcating PLA branches were seen. Thecircumflex was widely patent and smooth throughout its course and appearedangiographically normal.  The saphenous vein graft to the mid left anterior descending was widelypatent and smooth throughout its course with an excellent anastomosis to themid LAD. It filled to just beyond the proximal third of the LAD. It filleda diagonal branch well. It bifurcated at the apex, and there was retrogradefilling of septal perforators. There were collateral filling of a moderate-sized bifurcating marginal branch through the LIMA.  The saphenous vein graft to a very small DX1 was widely patent with nostenosis. Excellent anastomosis to a very small DX1 that bifurcated.  The LIMA to the LAD was atretic and essentially occluded in the junction ofits proximal and mid third with no antegrade filling. There was nosubclavian stenosis. Normal left vertebral with good antegrade flow.  The right brachiocephalic and subclavian were normal. The ungrafted RIMAwas widely patent.  ANGIOGRAM: LV angiogram in the RAO and LAO projection showed hypokinesis ofthe mid anterior wall and mild hypokinesis of the anteroapical segment. There was wall motion present in these areas with a very small akineticapical area, but no paradoxical motion or aneurysm formation. The posteriorwall and inferior wall contracted normally. Estimated ejection fraction wasapproximately 50-55% with no mitral regurgitation.  Abdominal aortic angiogram in the mid stream PA projection showed a normalproximal celiac and SMA axis. The renals were single and normalbilaterally. The IMA was intact. The infrarenal abdominal aorta  had veryminimal distal atherosclerotic disease, and the common and external iliacsappeared normal. The hypogastrics were intact. CATHETERIZATION DIAGNOSES: 1. Arteriosclerotic heart disease status post  anterior wall myocardial infarction treated with emergency percutaneous transluminal coronaryangioplasty (POBA), January 19, 1996. 2. Left anterior descending stenting for symptomatic restenosis, February23, 1998. 3. Second restenosis in-stent prompting referral for elective coronaryartery bypass graft July 31, 1996. 4. Coronary artery bypass graft times 2, LIMA to LAD, SVG to diagonalcomplicated by anterior wall myocardial infarction positively, andsuccessful saphenous vein graft to the mid LAD on reoperation. 5. System hypertension. Normal renal arteries. 6. Hyperlipidemia. 7. Irritable bowel syndrome under the care of Dominique. Nelwyn Salisbury. 8. Gastroesophageal reflux disease. 9. Thyroid nodule, under the care of Dominique. Parke Poisson. Gegick. 10. Remote cigarette abuse. Intermittent continued smoking.   Laboratory Data:  Chemistry Recent Labs  Lab 01/02/20 0617  NA 137  K 3.7  CL 107  CO2 17*  GLUCOSE 82  BUN 15  CREATININE 0.94  CALCIUM 8.8*  GFRNONAA >60  ANIONGAP 13    Recent Labs  Lab 01/02/20 0617  PROT 6.6  ALBUMIN 3.7  AST 29  ALT 28  ALKPHOS 64  BILITOT 0.9   Hematology Recent Labs  Lab 01/02/20 0617  WBC 9.3  RBC 4.65  HGB 14.5  HCT 43.6  MCV 93.8  MCH 31.2  MCHC 33.3  RDW 12.6  PLT 218   Cardiac Enzymes  High Sensitivity Troponin:   Recent Labs  Lab 01/02/20 0617 01/02/20 1001  TROPONINIHS 504* 649*     BNPNo results for input(s): BNP, PROBNP in the last 168 hours.  DDimer No results for input(s): DDIMER in the last 168 hours. Lipids:  Lab Results  Component Value Date   CHOL 119 02/06/2019   HDL 52 02/06/2019   LDLCALC 46 02/06/2019   TRIG 119 02/06/2019   CHOLHDL 2.3 02/06/2019   INR:  Lab Results  Component Value Date   INR 1.7 (H) 01/02/2020   A1c:  Lab Results  Component Value Date   HGBA1C 5.6 05/09/2016   Thyroid:  Lab Results  Component Value Date   TSH 1.63 05/14/2019    Radiology/Studies:   CT Abdomen Pelvis Wo Contrast  Result Date: 01/02/2020 CLINICAL DATA:  Nausea and vomiting, epigastric pain EXAM: CT ABDOMEN AND PELVIS WITHOUT CONTRAST TECHNIQUE: Multidetector CT imaging of the abdomen and pelvis was performed following the standard protocol without IV contrast. COMPARISON:  Most recent imaging is from 2006 FINDINGS: Lower chest: No acute abnormality. Hepatobiliary: Too small to characterize low-attenuation lesion of the right hepatic lobe may reflect a small cyst. Cholecystectomy. No biliary dilatation. Pancreas: Unremarkable. Spleen: Unremarkable. Adrenals/Urinary Tract: Adrenals are unremarkable. Exophytic cyst of the interpolar region of the left kidney. Bladder is moderately distended and unremarkable. Stomach/Bowel: Stomach is within normal limits. Bowel is normal in caliber. Vascular/Lymphatic: Aortic atherosclerosis. No enlarged lymph nodes identified. Reproductive: Small uterine calcification at this site of fibroid on the prior study. No adnexal mass. Other: No ascites.  Abdominal wall is unremarkable. Musculoskeletal: No acute osseous abnormality. IMPRESSION: No acute abnormality or findings to account for reported symptoms. Electronically Signed   By: Macy Mis M.D.   On: 01/02/2020 09:31   DG Chest Port 1 View  Result Date: 01/02/2020 CLINICAL DATA:  64 year old female with chest pain and abdominal bloating. EXAM: PORTABLE CHEST 1 VIEW COMPARISON:  Portable chest 05/04/2014 and earlier. FINDINGS: Portable AP upright view at 0657 hours. Chronic large lung volumes. Prior CABG. Normal cardiac size and  mediastinal contours. Visualized tracheal air column is within normal limits. Allowing for portable technique the lungs are clear. No pneumothorax. No acute osseous abnormality identified. IMPRESSION: Chronic pulmonary hyperinflation and prior CABG. No acute cardiopulmonary abnormality. Electronically Signed   By: Odessa Fleming M.D.   On: 01/02/2020 07:06    Assessment and Plan:    1.  Persistent atrial fibrillation, RVR -She is clear that her palpitations did not start until this morning. -She has not missed any doses of her Xarelto. -Will discuss cardioversion with MD  - She is reluctant to do this because of having been defibrillated before, but I explained that both the situation and the medications used are much safer today. - she spontaneously converted to SR, no DCCV needed. - will increase home metoprolol 37.5 mg >> 50 mg bid - as she has palpitations on a regular basis, consider outpt monitor  2. IBS flare -Started after eating a [employer] fish fillet sandwich, apparently she does not tolerate fried foods well -She still feels bloated, but is no longer constipated - feel this could likely be treated symptomatically  - discuss GI consult w/ MD  3. Hx CAD/CABG, elevated troponin - she has had some CP in association w/ her abd pain - no hx exertional sx - however, she has trop elevation in the setting of AF RVR - will admit obs overnight, ck echo - if no recurrence of arrhythmia and echo ok, d/c in am and f/u as outpt w/ either MV or POET  4. Hx Hypothyroid - continue home dose synthroid, ck TSH   Active Problems:   Persistent atrial fibrillation with rapid ventricular response (HCC)  For questions or updates, please contact [facility] Please consult [email or web address] for contact info under Cardiology/STEMI.    Signed, Theodore Demark, PA-C  01/02/2020 11:52 AM    History and all data above reviewed.  Patient examined.  I agree with the findings as above.  The patient has a past cardiac history as above.  She actually been well for quite some time.  She has had some atrial fibrillation as described above.  She thinks she has very brief paroxysms of this somewhat infrequently.  However, never lasting 1 to 2 seconds.  She has been having lots of complaints of abdominal discomfort in the last few days.  She describes this to some food she ate as  above.  She said that this morning she had this sharp abdominal discomfort radiating somewhat through to her back.  When she woke up she noticed that her heart was out of rhythm.  She felt fatigued.  It was beating fast.  She is not describing the chest discomfort that she had previously.  She said she has not had anything reminiscent of her previous infarct.  She is active.  She takes care of all [address].  She does all her household chores.  With this she denies any chest pressure, neck or arm discomfort.  She does not have any shortness of breath, PND or orthopnea.  In the emergency room she was found to be in atrial fibrillation with rapid rate.  She does tolerate her anticoagulation.  Enzymes were slightly elevated as above.  EKG demonstrated no acute ST segment changes.  The patient exam reveals COR:RRR  ,  Lungs: Clear  ,  Abd: Positive bowel sounds, no rebound no guarding, Ext No edema  .  All available labs, radiology testing, previous records reviewed. Agree with documented assessment and plan.  ATRIAL FIB 60: While we are discussing possible cardioversion with the patient she spontaneously converted to sinus rhythm.  She is already on anticoagulation.  Given the infrequency of her symptoms I do not think she yet needs an antiarrhythmic.  I suspect that this event might have been brought on by her abdominal discomfort.   ELEVATED CARDIAC ENZYMES: The etiology of this is likely the atrial fibrillation with a rapid rate.  She has not otherwise had anginal symptoms.  However, given the level of high-sensitivity troponin I would like to observe her overnight.  I will check an echocardiogram.  She might need further in-hospital testing if she has any significant regional wall motion abnormalities.  Otherwise I think she can go home with plans for an outpatient POET ([employer] Exercise Treadmill).     Minus Breeding  3:37 PM  01/02/2020

## 2020-01-03 ENCOUNTER — Other Ambulatory Visit: Payer: Self-pay | Admitting: Cardiology

## 2020-01-03 ENCOUNTER — Encounter (HOSPITAL_COMMUNITY): Payer: Self-pay | Admitting: Cardiology

## 2020-01-03 DIAGNOSIS — R109 Unspecified abdominal pain: Secondary | ICD-10-CM | POA: Diagnosis not present

## 2020-01-03 DIAGNOSIS — R9431 Abnormal electrocardiogram [ECG] [EKG]: Secondary | ICD-10-CM

## 2020-01-03 DIAGNOSIS — Z20822 Contact with and (suspected) exposure to covid-19: Secondary | ICD-10-CM | POA: Diagnosis not present

## 2020-01-03 DIAGNOSIS — I519 Heart disease, unspecified: Secondary | ICD-10-CM

## 2020-01-03 DIAGNOSIS — I255 Ischemic cardiomyopathy: Secondary | ICD-10-CM | POA: Diagnosis present

## 2020-01-03 DIAGNOSIS — F17210 Nicotine dependence, cigarettes, uncomplicated: Secondary | ICD-10-CM | POA: Diagnosis not present

## 2020-01-03 DIAGNOSIS — J449 Chronic obstructive pulmonary disease, unspecified: Secondary | ICD-10-CM | POA: Diagnosis not present

## 2020-01-03 DIAGNOSIS — R079 Chest pain, unspecified: Secondary | ICD-10-CM

## 2020-01-03 DIAGNOSIS — E039 Hypothyroidism, unspecified: Secondary | ICD-10-CM | POA: Diagnosis not present

## 2020-01-03 DIAGNOSIS — I2510 Atherosclerotic heart disease of native coronary artery without angina pectoris: Secondary | ICD-10-CM | POA: Diagnosis not present

## 2020-01-03 DIAGNOSIS — I4891 Unspecified atrial fibrillation: Secondary | ICD-10-CM

## 2020-01-03 DIAGNOSIS — R778 Other specified abnormalities of plasma proteins: Secondary | ICD-10-CM | POA: Diagnosis present

## 2020-01-03 DIAGNOSIS — Z79899 Other long term (current) drug therapy: Secondary | ICD-10-CM | POA: Diagnosis not present

## 2020-01-03 DIAGNOSIS — Z7901 Long term (current) use of anticoagulants: Secondary | ICD-10-CM | POA: Diagnosis not present

## 2020-01-03 HISTORY — DX: Heart disease, unspecified: I51.9

## 2020-01-03 HISTORY — DX: Other specified abnormalities of plasma proteins: R77.8

## 2020-01-03 HISTORY — DX: Ischemic cardiomyopathy: I25.5

## 2020-01-03 LAB — PROTIME-INR
INR: 2.2 — ABNORMAL HIGH (ref 0.8–1.2)
Prothrombin Time: 24 seconds — ABNORMAL HIGH (ref 11.4–15.2)

## 2020-01-03 MED ORDER — METOPROLOL SUCCINATE ER 50 MG PO TB24: 50.0000 mg | ORAL_TABLET | Freq: Two times a day (BID) | ORAL | 6 refills | Status: DC

## 2020-01-03 MED ORDER — NICOTINE 14 MG/24HR TD PT24: 14.0000 mg | MEDICATED_PATCH | Freq: Every day | TRANSDERMAL | 0 refills | Status: DC

## 2020-01-03 MED ORDER — METOPROLOL SUCCINATE ER 50 MG PO TB24: 50.0000 mg | ORAL_TABLET | Freq: Two times a day (BID) | ORAL | Status: DC

## 2020-01-03 MED ORDER — LEVOTHYROXINE SODIUM 50 MCG PO TABS: 75.0000 ug | ORAL_TABLET | Freq: Every day | ORAL | Status: DC

## 2020-01-03 MED ORDER — NITROGLYCERIN 0.4 MG SL SUBL: 0.4000 mg | SUBLINGUAL_TABLET | SUBLINGUAL | 4 refills | Status: DC | PRN

## 2020-01-03 MED ORDER — POTASSIUM CHLORIDE ER 10 MEQ PO TBCR: 10.0000 meq | EXTENDED_RELEASE_TABLET | Freq: Every day | ORAL | Status: DC

## 2020-01-03 MED ORDER — HYOSCYAMINE SULFATE 0.125 MG PO TABS: 0.1250 mg | ORAL_TABLET | Freq: Two times a day (BID) | ORAL | Status: DC

## 2020-01-03 NOTE — Discharge Summary (Signed)
Discharge Summary    Patient ID: JAZIA FARACI MRN: [id number]; DOB: Jan 04, 1956  Admit date: 01/02/2020 Discharge date: 01/03/2020  Primary Care Provider: Arvilla Market, DO  Primary Cardiologist: Bryan Lemma, MD  Primary Electrophysiologist:  None   Discharge Diagnoses    Principal Problem:   Atrial fibrillation with rapid ventricular response [facility]) Active Problems:   S/P CABG x 2   Tobacco abuse   Persistent atrial fibrillation with rapid ventricular response [facility])   Essential hypertension   LV dysfunction   Elevated troponin    Diagnostic Studies/Procedures    Echo 01/02/20 IMPRESSIONS    1. Left ventricular ejection fraction, by estimation, is 45 to 50%. The  left ventricle has mildly decreased function. The left ventricle  demonstrates regional wall motion abnormalities (see scoring  diagram/findings for description). Left ventricular  diastolic parameters are consistent with Grade II diastolic dysfunction  (pseudonormalization).  2. Right ventricular systolic function is normal. The right ventricular  size is normal.  3. The mitral valve is normal in structure. Trivial mitral valve  regurgitation. No evidence of mitral stenosis.  4. The aortic valve is normal in structure. Aortic valve regurgitation is  not visualized. No aortic stenosis is present.  5. The inferior vena cava is normal in size with greater than 50%  respiratory variability, suggesting right atrial pressure of 3 mmHg.   FINDINGS  Left Ventricle: Left ventricular ejection fraction, by estimation, is 45  to 50%. The left ventricle has mildly decreased function. The left  ventricle demonstrates regional wall motion abnormalities. The left  ventricular internal cavity size was normal  in size. There is no left ventricular hypertrophy. Left ventricular  diastolic parameters are consistent with Grade II diastolic dysfunction  (pseudonormalization).     LV Wall Scoring:   The apical septal segment, apical inferior segment, and apex are akinetic.   Right Ventricle: The right ventricular size is normal. No increase in  right ventricular wall thickness. Right ventricular systolic function is  normal.   Left Atrium: Left atrial size was normal in size.   Right Atrium: Right atrial size was normal in size.   Pericardium: There is no evidence of pericardial effusion.   Mitral Valve: The mitral valve is normal in structure. Trivial mitral  valve regurgitation. No evidence of mitral valve stenosis.   Tricuspid Valve: The tricuspid valve is normal in structure. Tricuspid  valve regurgitation is not demonstrated. No evidence of tricuspid  stenosis.   Aortic Valve: The aortic valve is normal in structure. Aortic valve  regurgitation is not visualized. No aortic stenosis is present.   Pulmonic Valve: The pulmonic valve was normal in structure. Pulmonic valve  regurgitation is not visualized. No evidence of pulmonic stenosis.   Aorta: The aortic root is normal in size and structure.   Venous: The inferior vena cava is normal in size with greater than 50%  respiratory variability, suggesting right atrial pressure of 3 mmHg.   IAS/Shunts: No atrial level shunt detected by color flow Doppler.     LEFT VENTRICLE  PLAX 2D  LVIDd:     4.10 cm   Diastology  LVIDs:     3.20 cm   LV e' medial:  5.35 cm/s  LV PW:     0.80 cm   LV E/e' medial: 13.7  LV IVS:    0.80 cm   LV e' lateral:  5.59 cm/s  LVOT diam:   1.70 cm   LV E/e' lateral: 13.1  LV SV:  40  LV SV Index:  25  LVOT Area:   2.27 cm    LV Volumes (MOD)  LV vol d, MOD A2C: 66.4 ml  LV vol d, MOD A4C: 69.0 ml  LV vol s, MOD A2C: 37.4 ml  LV vol s, MOD A4C: 37.2 ml  LV SV MOD A2C:   29.0 ml  LV SV MOD A4C:   69.0 ml  LV SV MOD BP:   31.4 ml   RIGHT VENTRICLE  RV S prime:   6.89 cm/s  TAPSE (M-mode): 1.4 cm   LEFT ATRIUM       Index     RIGHT ATRIUM     Index  LA diam:    3.00 cm 1.89 cm/m RA Area:   8.35 cm  LA Vol (A2C):  23.7 ml 14.93 ml/m RA Volume:  14.60 ml 9.20 ml/m  LA Vol (A4C):  22.6 ml 14.23 ml/m  LA Biplane Vol: 25.6 ml 16.12 ml/m  AORTIC VALVE  LVOT Vmax:  86.80 cm/s  LVOT Vmean: 54.000 cm/s  LVOT VTI:  0.178 m    AORTA  Ao Root diam: 2.50 cm   MITRAL VALVE  MV Area (PHT): 4.21 cm  SHUNTS  MV Decel Time: 180 msec  Systemic VTI: 0.18 m  MV E velocity: 73.30 cm/s Systemic Diam: 1.70 cm  MV A velocity: 34.30 cm/s  MV E/A ratio: 2.14  _____________   History of Present Illness     DUCHESS MARCUM is a 64 y.o. female with a history of STEMI 1998w/ VF arrest & anoxic brain injury w/ status epilepticus, s/p blow-by w/ BMS LAD 1 mo later. ISR>>CABG 1998 w/ LIMA-LAD & SVG-D1,AMI w/emergent re-operation w/ SVG-LAD 1998, PAF, COPD, GERD, IBS, HTN, HLD and had been doing well until 12/27/19 when after eating fish sandwich she developed IBS exacerbation and could not eat much solid food until Monday.  She took her OTC meds and had BMs and felt better but developed fluttering heart beat that was rapid.  No chest pain or SOB but upper abd pain.  She had not missed any of her xarelto.  She has palpitations that are self limiting but this was different.    She came to ER and EKG with Atrial fibrillation, heart rate 128, no obvious ischemic changes.  HR on EKG 128 and on tele up to 140.  She then converted spontaneously in ER.  Her metoprolol was increased to 50 mg BID.   Her troponin was elevated though so she was admitted to OBS for further eval.   Hospital Course     Consultants: none   Pt has remained in SR with no further atrial fib.  Her upper abd pain resolved once converted to SR.   She had echo with decrease in EF from 55-60% to 45-50% - she was in SR when done.   Her pk troponin was 1309.    Today she has no chest pain or SOB.  EKG today shows sinus rhythm, rate 63,  T wave inversions in leads I, aVL, V2-6.  Dr. Gardiner Rhyme discussed possibility of MI vs demand ischemia from a fib.  We discussed options of eval and she was adamant about discharge today.  Plan will be outpt lexiscan myoview.  She has been instructed on risks.  She will need early follow up with Dr. Ellyn Hack or an APP.    We adjusted her BB but could not add ACE or ARB due to hypotension.  This will be  considered at outpt with drop in her EF.   She will continue with nicoderm for tobacco use.  Did the patient have an acute coronary syndrome (MI, NSTEMI, STEMI, etc) this admission?:  No.   The elevated Troponin was due to the acute medical illness (demand ischemia).   Shared Decision Making/Informed Consent The risks [chest pain, shortness of breath, cardiac arrhythmias, dizziness, blood pressure fluctuations, myocardial infarction, stroke/transient ischemic attack, nausea, vomiting, allergic reaction, radiation exposure, metallic taste sensation and life-threatening complications (estimated to be 1 in 10,000)], benefits (risk stratification, diagnosing coronary artery disease, treatment guidance) and alternatives of a nuclear stress test were discussed in detail with Ms. Ancona and she agrees to proceed.     _____________  Discharge Vitals Blood pressure 127/63, pulse (!) 49, temperature 98 F (36.7 C), temperature source Oral, resp. rate 17, height 5\' 1"  (1.549 m), weight 56.1 kg, SpO2 97 %.  Filed Weights   01/02/20 1459 01/02/20 1618 01/03/20 0424  Weight: 55.6 kg 55.6 kg 56.1 kg    Labs & Radiologic Studies    CBC Recent Labs    01/02/20 0617  WBC 9.3  HGB 14.5  HCT 43.6  MCV 93.8  PLT 99991111   Basic Metabolic Panel Recent Labs    01/02/20 0617 01/02/20 1001  NA 137  --   K 3.7  --   CL 107  --   CO2 17*  --   GLUCOSE 82  --   BUN 15  --   CREATININE 0.94  --   CALCIUM 8.8*  --   MG  --  1.8   Liver Function Tests Recent Labs    01/02/20 0617  AST 29  ALT 28   ALKPHOS 64  BILITOT 0.9  PROT 6.6  ALBUMIN 3.7   Recent Labs    01/02/20 0617  LIPASE 25   High Sensitivity Troponin:   Recent Labs  Lab 01/02/20 0617 01/02/20 1001 01/02/20 1514  TROPONINIHS 504* 649* 1,309*    BNP Invalid input(s): POCBNP D-Dimer No results for input(s): DDIMER in the last 72 hours. Hemoglobin A1C No results for input(s): HGBA1C in the last 72 hours. Fasting Lipid Panel No results for input(s): CHOL, HDL, LDLCALC, TRIG, CHOLHDL, LDLDIRECT in the last 72 hours. Thyroid Function Tests Recent Labs    01/02/20 1514  TSH 1.139   _____________  CT Abdomen Pelvis Wo Contrast  Result Date: 01/02/2020 CLINICAL DATA:  Nausea and vomiting, epigastric pain EXAM: CT ABDOMEN AND PELVIS WITHOUT CONTRAST TECHNIQUE: Multidetector CT imaging of the abdomen and pelvis was performed following the standard protocol without IV contrast. COMPARISON:  Most recent imaging is from 2006 FINDINGS: Lower chest: No acute abnormality. Hepatobiliary: Too small to characterize low-attenuation lesion of the right hepatic lobe may reflect a small cyst. Cholecystectomy. No biliary dilatation. Pancreas: Unremarkable. Spleen: Unremarkable. Adrenals/Urinary Tract: Adrenals are unremarkable. Exophytic cyst of the interpolar region of the left kidney. Bladder is moderately distended and unremarkable. Stomach/Bowel: Stomach is within normal limits. Bowel is normal in caliber. Vascular/Lymphatic: Aortic atherosclerosis. No enlarged lymph nodes identified. Reproductive: Small uterine calcification at this site of fibroid on the prior study. No adnexal mass. Other: No ascites.  Abdominal wall is unremarkable. Musculoskeletal: No acute osseous abnormality. IMPRESSION: No acute abnormality or findings to account for reported symptoms. Electronically Signed   By: Macy Mis M.D.   On: 01/02/2020 09:31   DG Chest Port 1 View  Result Date: 01/02/2020 CLINICAL DATA:  64 year old female with chest pain  and abdominal bloating. EXAM: PORTABLE CHEST 1 VIEW COMPARISON:  Portable chest 05/04/2014 and earlier. FINDINGS: Portable AP upright view at 0657 hours. Chronic large lung volumes. Prior CABG. Normal cardiac size and mediastinal contours. Visualized tracheal air column is within normal limits. Allowing for portable technique the lungs are clear. No pneumothorax. No acute osseous abnormality identified. IMPRESSION: Chronic pulmonary hyperinflation and prior CABG. No acute cardiopulmonary abnormality. Electronically Signed   By: Genevie Ann M.D.   On: 01/02/2020 07:06   ECHOCARDIOGRAM COMPLETE  Result Date: 01/02/2020    ECHOCARDIOGRAM REPORT   Patient Name:   SHERRYANN HINZE Date of Exam: 01/02/2020 Medical Rec #:  [id number]          Height:       61.0 in Accession #:    [id number]         Weight:       132.9 lb Date of Birth:  March 26, 1955           BSA:          1.588 m Patient Age:    19 years           BP:           106/91 mmHg Patient Gender: F                  HR:           61 bpm. Exam Location:  Inpatient Procedure: 2D Echo, Cardiac Doppler and Color Doppler Indications:    Elevated Troponin  History:        Patient has prior history of Echocardiogram examinations, most                 recent 07/03/2014. CAD, Prior CABG, COPD, Arrythmias:Atrial                 Fibrillation and STEMI; Risk Factors:Hypertension, Dyslipidemia                 and Current Smoker. GERD.  Sonographer:    Vickie Epley RDCS Referring Phys: 81 [place]  1. Left ventricular ejection fraction, by estimation, is 45 to 50%. The left ventricle has mildly decreased function. The left ventricle demonstrates regional wall motion abnormalities (see scoring diagram/findings for description). Left ventricular diastolic parameters are consistent with Grade II diastolic dysfunction (pseudonormalization).  2. Right ventricular systolic function is normal. The right ventricular size is normal.  3. The mitral valve is normal in  structure. Trivial mitral valve regurgitation. No evidence of mitral stenosis.  4. The aortic valve is normal in structure. Aortic valve regurgitation is not visualized. No aortic stenosis is present.  5. The inferior vena cava is normal in size with greater than 50% respiratory variability, suggesting right atrial pressure of 3 mmHg. FINDINGS  Left Ventricle: Left ventricular ejection fraction, by estimation, is 45 to 50%. The left ventricle has mildly decreased function. The left ventricle demonstrates regional wall motion abnormalities. The left ventricular internal cavity size was normal in size. There is no left ventricular hypertrophy. Left ventricular diastolic parameters are consistent with Grade II diastolic dysfunction (pseudonormalization).  LV Wall Scoring: The apical septal segment, apical inferior segment, and apex are akinetic. Right Ventricle: The right ventricular size is normal. No increase in right ventricular wall thickness. Right ventricular systolic function is normal. Left Atrium: Left atrial size was normal in size. Right Atrium: Right atrial size was normal in size. Pericardium: There is no evidence of pericardial effusion. Mitral  Valve: The mitral valve is normal in structure. Trivial mitral valve regurgitation. No evidence of mitral valve stenosis. Tricuspid Valve: The tricuspid valve is normal in structure. Tricuspid valve regurgitation is not demonstrated. No evidence of tricuspid stenosis. Aortic Valve: The aortic valve is normal in structure. Aortic valve regurgitation is not visualized. No aortic stenosis is present. Pulmonic Valve: The pulmonic valve was normal in structure. Pulmonic valve regurgitation is not visualized. No evidence of pulmonic stenosis. Aorta: The aortic root is normal in size and structure. Venous: The inferior vena cava is normal in size with greater than 50% respiratory variability, suggesting right atrial pressure of 3 mmHg. IAS/Shunts: No atrial level shunt  detected by color flow Doppler.  LEFT VENTRICLE PLAX 2D LVIDd:         4.10 cm     Diastology LVIDs:         3.20 cm     LV e' medial:    5.35 cm/s LV PW:         0.80 cm     LV E/e' medial:  13.7 LV IVS:        0.80 cm     LV e' lateral:   5.59 cm/s LVOT diam:     1.70 cm     LV E/e' lateral: 13.1 LV SV:         40 LV SV Index:   25 LVOT Area:     2.27 cm  LV Volumes (MOD) LV vol d, MOD A2C: 66.4 ml LV vol d, MOD A4C: 69.0 ml LV vol s, MOD A2C: 37.4 ml LV vol s, MOD A4C: 37.2 ml LV SV MOD A2C:     29.0 ml LV SV MOD A4C:     69.0 ml LV SV MOD BP:      31.4 ml RIGHT VENTRICLE RV S prime:     6.89 cm/s TAPSE (M-mode): 1.4 cm LEFT ATRIUM             Index       RIGHT ATRIUM          Index LA diam:        3.00 cm 1.89 cm/m  RA Area:     8.35 cm LA Vol (A2C):   23.7 ml 14.93 ml/m RA Volume:   14.60 ml 9.20 ml/m LA Vol (A4C):   22.6 ml 14.23 ml/m LA Biplane Vol: 25.6 ml 16.12 ml/m  AORTIC VALVE LVOT Vmax:   86.80 cm/s LVOT Vmean:  54.000 cm/s LVOT VTI:    0.178 m  AORTA Ao Root diam: 2.50 cm MITRAL VALVE MV Area (PHT): 4.21 cm    SHUNTS MV Decel Time: 180 msec    Systemic VTI:  0.18 m MV E velocity: 73.30 cm/s  Systemic Diam: 1.70 cm MV A velocity: 34.30 cm/s MV E/A ratio:  2.14 Candee Furbish MD Electronically signed by Candee Furbish MD Signature Date/Time: 01/02/2020/3:00:42 PM    Final    Disposition   Pt is being discharged home today in good condition.  Follow-up Plans & Appointments   If you have any further problems call the office or come to ER  We adjusted your meds please check.  Your metoprolol changed to XL.  Continue your IBS medications as before.  They will schedule your stress test for next week.  Ask for instructions when they call.  But nothing to eat or drink 4 hours prior to study and only light food.  No caffeine for 48 hour prior to  study.  Heart Healthy diet     Follow-up Information    Leonie Man, MD Follow up.   Specialty: Cardiology Why: the office will call you with  date and time.  if you have not heard by 01/07/19 then please call the office.  Contact information: [place] [address] [address] [place] [facility].   Specialty: Cardiology Why: the office will call Monday about stress test  Contact information: [address] [place] [phone]               Discharge Medications   Allergies as of 01/03/2020      Reactions   Atorvastatin Other (See Comments)   Gas pain in abdomen with no relief   Eluxadoline Nausea And Vomiting, Other (See Comments)   Viberzi- And, made the patient feel "high"   Iohexol Hives, Rash   Red dye      Medication List    STOP taking these medications   metoprolol tartrate 25 MG tablet Commonly known as: LOPRESSOR     TAKE these medications   acetaminophen 500 MG tablet Commonly known as: TYLENOL Take 500 mg by mouth every 6 (six) hours as needed for mild pain.   alum & mag hydroxide-simeth 200-200-20 MG/5ML suspension Commonly known as: MAALOX/MYLANTA Take 15-30 mLs by mouth every 6 (six) hours as needed for indigestion or flatulence.   CALCIUM+D3 PO Take 1 tablet by mouth daily with breakfast.   cetirizine 10 MG tablet Commonly known as: ZYRTEC TAKE 1 TABLET DAILY   levothyroxine 50 MCG tablet Commonly known as: SYNTHROID Take 1.5 tablets (75 mcg total) by mouth daily before breakfast. TAKE ONE AND ONE-HALF TABLETS DAILY BEFORE BREAKFAST   metoprolol succinate 50 MG 24 hr tablet Commonly known as: TOPROL-XL Take 1 tablet (50 mg total) by mouth 2 (two) times daily. Take with or immediately following a meal.   nicotine 14 mg/24hr patch Commonly known as: NICODERM CQ - dosed in mg/24 hours Place 1 patch (14 mg total) onto the skin daily. Start taking on: January 04, 2020   nitroGLYCERIN 0.4 MG SL tablet Commonly known as: NITROSTAT Place 1 tablet (0.4 mg total) under the tongue every 5  (five) minutes x 3 doses as needed for chest pain.   NON FORMULARY Take 1 capsule by mouth See admin instructions. Renew Life Women's Probiotics- Take 1 capsule by mouth once a day   NON FORMULARY Take 1 capsule by mouth See admin instructions. NOW Supplements/Peppermint Gels with Ginger & Fennel Oils, Enteric Coated, Digestive Support- Take 1 capsule by mouth before each meal   potassium chloride 10 MEQ tablet Commonly known as: KLOR-CON Take 1 tablet (10 mEq total) by mouth at bedtime. What changed: See the new instructions.   rivaroxaban 20 MG Tabs tablet Commonly known as: Xarelto Take 1 tablet (20 mg total) by mouth daily with supper. What changed: when to take this   rosuvastatin 20 MG tablet Commonly known as: CRESTOR Take 1 tablet (20 mg total) by mouth daily.   simethicone 125 MG chewable tablet Commonly known as: MYLICON Chew 0000000 mg by mouth See admin instructions. Chew 125 mg by mouth three times a day after meals as needed for gas   Symax-SR 0.375 MG 12 hr tablet Generic drug: hyoscyamine Take 0.375 mg by mouth 2 (two) times daily.   hyoscyamine 0.125 MG tablet Commonly known as: LEVSIN Take 1  tablet (0.125 mg total) by mouth 2 (two) times daily.          Outstanding Labs/Studies   BMP  Duration of Discharge Encounter   Greater than 30 minutes including physician time.  Signed, Cecilie Kicks, NP 01/03/2020, 11:27 AM

## 2020-01-03 NOTE — Plan of Care (Signed)
  Problem: Education: Goal: Understanding of cardiac disease, CV risk reduction, and recovery process will improve Outcome: Adequate for Discharge Goal: Individualized Educational Video(s) Outcome: Adequate for Discharge   Problem: Activity: Goal: Ability to tolerate increased activity will improve Outcome: Adequate for Discharge   Problem: Cardiac: Goal: Ability to achieve and maintain adequate cardiovascular perfusion will improve Outcome: Adequate for Discharge   Problem: Health Behavior/Discharge Planning: Goal: Ability to safely manage health-related needs after discharge will improve Outcome: Adequate for Discharge   Problem: Education: Goal: Knowledge of General Education information will improve Description: Including pain rating scale, medication(s)/side effects and non-pharmacologic comfort measures Outcome: Adequate for Discharge   Problem: Health Behavior/Discharge Planning: Goal: Ability to manage health-related needs will improve Outcome: Adequate for Discharge   Problem: Clinical Measurements: Goal: Ability to maintain clinical measurements within normal limits will improve Outcome: Adequate for Discharge Goal: Will remain free from infection Outcome: Adequate for Discharge Goal: Diagnostic test results will improve Outcome: Adequate for Discharge Goal: Respiratory complications will improve Outcome: Adequate for Discharge Goal: Cardiovascular complication will be avoided Outcome: Adequate for Discharge   Problem: Activity: Goal: Risk for activity intolerance will decrease Outcome: Adequate for Discharge   Problem: Nutrition: Goal: Adequate nutrition will be maintained Outcome: Adequate for Discharge   Problem: Coping: Goal: Level of anxiety will decrease Outcome: Adequate for Discharge   Problem: Elimination: Goal: Will not experience complications related to bowel motility Outcome: Adequate for Discharge Goal: Will not experience complications  related to urinary retention Outcome: Adequate for Discharge   Problem: Pain Managment: Goal: General experience of comfort will improve Outcome: Adequate for Discharge   Problem: Safety: Goal: Ability to remain free from injury will improve Outcome: Adequate for Discharge   Problem: Skin Integrity: Goal: Risk for impaired skin integrity will decrease Outcome: Adequate for Discharge   

## 2020-01-03 NOTE — Progress Notes (Addendum)
Progress Note  Patient Name: Dominique Barber Date of Encounter: 01/03/2020  [facility] Cardiologist: Glenetta Hew, MD   Subjective   No further chest pain, no SOB no more atrial fib.  Inpatient Medications    Scheduled Meds: . acidophilus  1 capsule Oral Daily  . calcium carbonate  500 mg Oral Daily  . hyoscyamine  0.375 mg Oral BID  . levothyroxine  75 mcg Oral Q0600  . loratadine  10 mg Oral Daily  . metoprolol tartrate  50 mg Oral BID  . nicotine  14 mg Transdermal Daily  . rivaroxaban  20 mg Oral Q supper  . rosuvastatin  20 mg Oral Daily  . sodium chloride flush  3 mL Intravenous Q12H   Continuous Infusions: . sodium chloride     PRN Meds: sodium chloride, acetaminophen, ALPRAZolam, nitroGLYCERIN, ondansetron (ZOFRAN) IV, sodium chloride flush, zolpidem   Vital Signs    Vitals:   01/02/20 1618 01/02/20 2011 01/03/20 0046 01/03/20 0424  BP: 112/70 113/70 115/75 (!) 114/49  Pulse: 68 68 62 (!) 52  Resp: 18 17 17 16   Temp: 97.8 F (36.6 C) 98 F (36.7 C) 98.2 F (36.8 C) 98.3 F (36.8 C)  TempSrc: Oral Oral Oral Oral  SpO2: 99%  98% 94%  Weight: 55.6 kg   56.1 kg  Height: 5\' 1"  (1.549 m)       Intake/Output Summary (Last 24 hours) at 01/03/2020 0805 Last data filed at 01/03/2020 0300 Gross per 24 hour  Intake 253.7 ml  Output 1 ml  Net 252.7 ml   Last 3 Weights 01/03/2020 01/02/2020 01/02/2020  Weight (lbs) 123 lb 9.6 oz 122 lb 8 oz 122 lb 8 oz  Weight (kg) 56.065 kg 55.566 kg 55.566 kg      Telemetry    SR - Personally Reviewed  ECG    No new - Personally Reviewed  Physical Exam   GEN: No acute distress.   Neck: No JVD Cardiac: RRR, no murmurs, rubs, or gallops.  Respiratory: Clear to auscultation bilaterally. GI: Soft, nontender, non-distended  MS: No edema; No deformity. Neuro:  Nonfocal  Psych: Normal affect   Labs    High Sensitivity Troponin:   Recent Labs  Lab 01/02/20 0617 01/02/20 1001 01/02/20 1514   TROPONINIHS 504* 649* 1,309*      Chemistry Recent Labs  Lab 01/02/20 0617  NA 137  K 3.7  CL 107  CO2 17*  GLUCOSE 82  BUN 15  CREATININE 0.94  CALCIUM 8.8*  PROT 6.6  ALBUMIN 3.7  AST 29  ALT 28  ALKPHOS 64  BILITOT 0.9  GFRNONAA >60  ANIONGAP 13     Hematology Recent Labs  Lab 01/02/20 0617  WBC 9.3  RBC 4.65  HGB 14.5  HCT 43.6  MCV 93.8  MCH 31.2  MCHC 33.3  RDW 12.6  PLT 218    BNPNo results for input(s): BNP, PROBNP in the last 168 hours.   DDimer No results for input(s): DDIMER in the last 168 hours.   Radiology    CT Abdomen Pelvis Wo Contrast  Result Date: 01/02/2020 CLINICAL DATA:  Nausea and vomiting, epigastric pain EXAM: CT ABDOMEN AND PELVIS WITHOUT CONTRAST TECHNIQUE: Multidetector CT imaging of the abdomen and pelvis was performed following the standard protocol without IV contrast. COMPARISON:  Most recent imaging is from 2006 FINDINGS: Lower chest: No acute abnormality. Hepatobiliary: Too small to characterize low-attenuation lesion of the right hepatic lobe may reflect a small cyst.  Cholecystectomy. No biliary dilatation. Pancreas: Unremarkable. Spleen: Unremarkable. Adrenals/Urinary Tract: Adrenals are unremarkable. Exophytic cyst of the interpolar region of the left kidney. Bladder is moderately distended and unremarkable. Stomach/Bowel: Stomach is within normal limits. Bowel is normal in caliber. Vascular/Lymphatic: Aortic atherosclerosis. No enlarged lymph nodes identified. Reproductive: Small uterine calcification at this site of fibroid on the prior study. No adnexal mass. Other: No ascites.  Abdominal wall is unremarkable. Musculoskeletal: No acute osseous abnormality. IMPRESSION: No acute abnormality or findings to account for reported symptoms. Electronically Signed   By: Macy Mis M.D.   On: 01/02/2020 09:31   DG Chest Port 1 View  Result Date: 01/02/2020 CLINICAL DATA:  64 year old female with chest pain and abdominal  bloating. EXAM: PORTABLE CHEST 1 VIEW COMPARISON:  Portable chest 05/04/2014 and earlier. FINDINGS: Portable AP upright view at 0657 hours. Chronic large lung volumes. Prior CABG. Normal cardiac size and mediastinal contours. Visualized tracheal air column is within normal limits. Allowing for portable technique the lungs are clear. No pneumothorax. No acute osseous abnormality identified. IMPRESSION: Chronic pulmonary hyperinflation and prior CABG. No acute cardiopulmonary abnormality. Electronically Signed   By: Genevie Ann M.D.   On: 01/02/2020 07:06   ECHOCARDIOGRAM COMPLETE  Result Date: 01/02/2020    ECHOCARDIOGRAM REPORT   Patient Name:   Dominique Barber Date of Exam: 01/02/2020 Medical Rec #:  [id number]          Height:       61.0 in Accession #:    [id number]         Weight:       132.9 lb Date of Birth:  October 23, 1955           BSA:          1.588 m Patient Age:    16 years           BP:           106/91 mmHg Patient Gender: F                  HR:           61 bpm. Exam Location:  Inpatient Procedure: 2D Echo, Cardiac Doppler and Color Doppler Indications:    Elevated Troponin  History:        Patient has prior history of Echocardiogram examinations, most                 recent 07/03/2014. CAD, Prior CABG, COPD, Arrythmias:Atrial                 Fibrillation and STEMI; Risk Factors:Hypertension, Dyslipidemia                 and Current Smoker. GERD.  Sonographer:    Vickie Epley RDCS Referring Phys: [phone] [place]  1. Left ventricular ejection fraction, by estimation, is 45 to 50%. The left ventricle has mildly decreased function. The left ventricle demonstrates regional wall motion abnormalities (see scoring diagram/findings for description). Left ventricular diastolic parameters are consistent with Grade II diastolic dysfunction (pseudonormalization).  2. Right ventricular systolic function is normal. The right ventricular size is normal.  3. The mitral valve is normal in structure.  Trivial mitral valve regurgitation. No evidence of mitral stenosis.  4. The aortic valve is normal in structure. Aortic valve regurgitation is not visualized. No aortic stenosis is present.  5. The inferior vena cava is normal in size with greater than 50% respiratory variability, suggesting right atrial pressure of  3 mmHg. FINDINGS  Left Ventricle: Left ventricular ejection fraction, by estimation, is 45 to 50%. The left ventricle has mildly decreased function. The left ventricle demonstrates regional wall motion abnormalities. The left ventricular internal cavity size was normal in size. There is no left ventricular hypertrophy. Left ventricular diastolic parameters are consistent with Grade II diastolic dysfunction (pseudonormalization).  LV Wall Scoring: The apical septal segment, apical inferior segment, and apex are akinetic. Right Ventricle: The right ventricular size is normal. No increase in right ventricular wall thickness. Right ventricular systolic function is normal. Left Atrium: Left atrial size was normal in size. Right Atrium: Right atrial size was normal in size. Pericardium: There is no evidence of pericardial effusion. Mitral Valve: The mitral valve is normal in structure. Trivial mitral valve regurgitation. No evidence of mitral valve stenosis. Tricuspid Valve: The tricuspid valve is normal in structure. Tricuspid valve regurgitation is not demonstrated. No evidence of tricuspid stenosis. Aortic Valve: The aortic valve is normal in structure. Aortic valve regurgitation is not visualized. No aortic stenosis is present. Pulmonic Valve: The pulmonic valve was normal in structure. Pulmonic valve regurgitation is not visualized. No evidence of pulmonic stenosis. Aorta: The aortic root is normal in size and structure. Venous: The inferior vena cava is normal in size with greater than 50% respiratory variability, suggesting right atrial pressure of 3 mmHg. IAS/Shunts: No atrial level shunt detected by  color flow Doppler.  LEFT VENTRICLE PLAX 2D LVIDd:         4.10 cm     Diastology LVIDs:         3.20 cm     LV e' medial:    5.35 cm/s LV PW:         0.80 cm     LV E/e' medial:  13.7 LV IVS:        0.80 cm     LV e' lateral:   5.59 cm/s LVOT diam:     1.70 cm     LV E/e' lateral: 13.1 LV SV:         40 LV SV Index:   25 LVOT Area:     2.27 cm  LV Volumes (MOD) LV vol d, MOD A2C: 66.4 ml LV vol d, MOD A4C: 69.0 ml LV vol s, MOD A2C: 37.4 ml LV vol s, MOD A4C: 37.2 ml LV SV MOD A2C:     29.0 ml LV SV MOD A4C:     69.0 ml LV SV MOD BP:      31.4 ml RIGHT VENTRICLE RV S prime:     6.89 cm/s TAPSE (M-mode): 1.4 cm LEFT ATRIUM             Index       RIGHT ATRIUM          Index LA diam:        3.00 cm 1.89 cm/m  RA Area:     8.35 cm LA Vol (A2C):   23.7 ml 14.93 ml/m RA Volume:   14.60 ml 9.20 ml/m LA Vol (A4C):   22.6 ml 14.23 ml/m LA Biplane Vol: 25.6 ml 16.12 ml/m  AORTIC VALVE LVOT Vmax:   86.80 cm/s LVOT Vmean:  54.000 cm/s LVOT VTI:    0.178 m  AORTA Ao Root diam: 2.50 cm MITRAL VALVE MV Area (PHT): 4.21 cm    SHUNTS MV Decel Time: 180 msec    Systemic VTI:  0.18 m MV E velocity: 73.30 cm/s  Systemic Diam: 1.70 cm MV  A velocity: 34.30 cm/s MV E/A ratio:  2.14 Donato Schultz MD Electronically signed by Donato Schultz MD Signature Date/Time: 01/02/2020/3:00:42 PM    Final     Cardiac Studies   ECHO 01/02/20 IMPRESSIONS    1. Left ventricular ejection fraction, by estimation, is 45 to 50%. The  left ventricle has mildly decreased function. The left ventricle  demonstrates regional wall motion abnormalities (see scoring  diagram/findings for description). Left ventricular  diastolic parameters are consistent with Grade II diastolic dysfunction  (pseudonormalization).  2. Right ventricular systolic function is normal. The right ventricular  size is normal.  3. The mitral valve is normal in structure. Trivial mitral valve  regurgitation. No evidence of mitral stenosis.  4. The aortic valve is  normal in structure. Aortic valve regurgitation is  not visualized. No aortic stenosis is present.  5. The inferior vena cava is normal in size with greater than 50%  respiratory variability, suggesting right atrial pressure of 3 mmHg.   FINDINGS  Left Ventricle: Left ventricular ejection fraction, by estimation, is 45  to 50%. The left ventricle has mildly decreased function. The left  ventricle demonstrates regional wall motion abnormalities. The left  ventricular internal cavity size was normal  in size. There is no left ventricular hypertrophy. Left ventricular  diastolic parameters are consistent with Grade II diastolic dysfunction  (pseudonormalization).     LV Wall Scoring:  The apical septal segment, apical inferior segment, and apex are akinetic.   Right Ventricle: The right ventricular size is normal. No increase in  right ventricular wall thickness. Right ventricular systolic function is  normal.   Left Atrium: Left atrial size was normal in size.   Right Atrium: Right atrial size was normal in size.   Pericardium: There is no evidence of pericardial effusion.   Mitral Valve: The mitral valve is normal in structure. Trivial mitral  valve regurgitation. No evidence of mitral valve stenosis.   Tricuspid Valve: The tricuspid valve is normal in structure. Tricuspid  valve regurgitation is not demonstrated. No evidence of tricuspid  stenosis.   Aortic Valve: The aortic valve is normal in structure. Aortic valve  regurgitation is not visualized. No aortic stenosis is present.   Pulmonic Valve: The pulmonic valve was normal in structure. Pulmonic valve  regurgitation is not visualized. No evidence of pulmonic stenosis.   Aorta: The aortic root is normal in size and structure.   Venous: The inferior vena cava is normal in size with greater than 50%  respiratory variability, suggesting right atrial pressure of 3 mmHg.   IAS/Shunts: No atrial level shunt detected by  color flow Doppler.     LEFT VENTRICLE  PLAX 2D  LVIDd:     4.10 cm   Diastology  LVIDs:     3.20 cm   LV e' medial:  5.35 cm/s  LV PW:     0.80 cm   LV E/e' medial: 13.7  LV IVS:    0.80 cm   LV e' lateral:  5.59 cm/s  LVOT diam:   1.70 cm   LV E/e' lateral: 13.1  LV SV:     40  LV SV Index:  25  LVOT Area:   2.27 cm    LV Volumes (MOD)  LV vol d, MOD A2C: 66.4 ml  LV vol d, MOD A4C: 69.0 ml  LV vol s, MOD A2C: 37.4 ml  LV vol s, MOD A4C: 37.2 ml  LV SV MOD A2C:   29.0 ml  LV SV MOD A4C:   69.0 ml  LV SV MOD BP:   31.4 ml   RIGHT VENTRICLE  RV S prime:   6.89 cm/s  TAPSE (M-mode): 1.4 cm   LEFT ATRIUM       Index    RIGHT ATRIUM     Index  LA diam:    3.00 cm 1.89 cm/m RA Area:   8.35 cm  LA Vol (A2C):  23.7 ml 14.93 ml/m RA Volume:  14.60 ml 9.20 ml/m  LA Vol (A4C):  22.6 ml 14.23 ml/m  LA Biplane Vol: 25.6 ml 16.12 ml/m  AORTIC VALVE  LVOT Vmax:  86.80 cm/s  LVOT Vmean: 54.000 cm/s  LVOT VTI:  0.178 m    AORTA  Ao Root diam: 2.50 cm   MITRAL VALVE  MV Area (PHT): 4.21 cm  SHUNTS  MV Decel Time: 180 msec  Systemic VTI: 0.18 m  MV E velocity: 73.30 cm/s Systemic Diam: 1.70 cm  MV A velocity: 34.30 cm/s  MV E/A ratio: 2.14   Patient Profile     64 y.o. female with a history of STEMI 1998w/ VF arrest & anoxic brain injury w/ status epilepticus, s/p blow-by w/ BMS LAD 1 mo later. ISR>>CABG 1998 w/ LIMA-LAD & SVG-D1,AMI w/emergent re-operation w/ SVG-LAD 1998, PAF, COPD, GERD, IBS, HTN, HLD,who is being seen today for the evaluation of Afib, RVRat the request of Dr Regenia Skeeter  Assessment & Plan    1.Persistent atrial fibrillation, RVR -She has not missed any doses of her Xarelto. -he spontaneously converted to SR,  -Continue metoprolol 50 mg twice daily, will consolidate to Toprol-XL   2.  Elevated Troponin  At 649 to 1309 and new decrease in EF from 55-60% to  45 to 50%  G2DD normal LA  -no further chest pain/abd pain, it resolved once her HR decreased -Hx CAD/CABG,  - no hx exertional sx - however, she has trop elevation in the setting of AF RVR, new wall motion abnormality on echo and new T wave inversions on EKG.  Recommended cardiac catheterization, but patient declines.  Also declines inpatient Myoview but willing to have done as outpatient.  We will schedule outpatient Myoview and proceed with cath if high risk findings  3. IBS flare -Started after eating a [employer] fish fillet sandwich, apparently she does not tolerate fried foods well -She still feels bloated, but is no longer constipated -feel this could likely be treated symptomatically   4. Hx Hypothyroid - continue home dose synthroid, TSH stable   For questions or updates, please contact [facility] Please consult [email or web address] for contact info under      Signed, Cecilie Kicks, NP  01/03/2020, 8:05 AM    Patient seen and examined.  Agree with the documentation.  Ms. Cancienne is a 64 year old female with a history of STEMI in 1998 with VF arrest and anoxic brain injury with status epilepticus with BMS to LAD 1 month later, CABG in 1998 with LIMA-LAD and SVG-D1, emergent reoperation later that year with SVG-LAD, PAF, COPD, GERD, IBS, hypertension, hyperlipidemia who presented with AF with RVR.  She reported that she began having GI symptoms after eating a [employer] filet of fish sandwich last week.  Yesterday when walking she felt like her heart started racing.  She has not missed any doses of Xarelto.  She was found to be in AF with rates up to 140s.  Metoprolol was increased to 50 mg twice daily.  She spontaneously converted to sinus rhythm,  rates in 63s.  Echocardiogram showed mild LV systolic dysfunction, EF 45 to 50%, with apical wall motion abnormality, normal RV function, no significant valvular disease.  EKG today shows sinus rhythm, rate 63, T wave inversions in leads  I, aVL, V2-6.  Her abdominal pain resolved when she converted to normal sinus rhythm, currently pain-free.  Telemetry reviewed, shows sinus rhythm with rate 50s to 60s. On exam, patient is alert and oriented, regular rate and rhythm, no murmurs, lungs CTAB, no LE edema.  Given troponin elevation with new wall motion abnormality on echo and EKG changes with new T wave inversions in precordial leads, recommend cardiac catheterization.  However she declined this and is adamant about being discharged today.  Discussed obtaining Lexiscan Myoview, but she also declines inpatient evaluation, though is willing to have it done as outpatient and agreeable to undergoing cath if Myoview shows high risk findings.  We will schedule outpatient Myoview and close follow-up with Dr. Ellyn Hack or APP.  Given new systolic dysfunction, will consolidate Lopressor to Toprol-XL.  Has had some soft BPs, will hold off on starting losartan, but can consider as outpatient if stable BP.  Donato Heinz, MD

## 2020-01-03 NOTE — Progress Notes (Signed)
D/C instructions given and reviewed. Questions asked and answered. Tele and IV removed, tolerated well. 

## 2020-01-03 NOTE — Discharge Instructions (Addendum)
If you have any further problems call the office or come to ER  We adjusted your meds please check.   Your metoprolol changed to XL.  Continue your IBS medications as before.     They will schedule your stress test for next week.  Ask for instructions when they call.  But nothing to eat or drink 4 hours prior to study and only light food.  No caffeine for 48 hour prior to study.  Heart Healthy diet       Information on my medicine - XARELTO (Rivaroxaban)  This medication education was reviewed with me or my healthcare representative as part of my discharge preparation.    Why was Xarelto prescribed for you? Xarelto was prescribed for you to reduce the risk of a blood clot forming that can cause a stroke if you have a medical condition called atrial fibrillation (a type of irregular heartbeat).  What do you need to know about xarelto ? Take your Xarelto ONCE DAILY at the same time every day with your evening meal. If you have difficulty swallowing the tablet whole, you may crush it and mix in applesauce just prior to taking your dose.  Take Xarelto exactly as prescribed by your doctor and DO NOT stop taking Xarelto without talking to the doctor who prescribed the medication.  Stopping without other stroke prevention medication to take the place of Xarelto may increase your risk of developing a clot that causes a stroke.  Refill your prescription before you run out.  After discharge, you should have regular check-up appointments with your healthcare provider that is prescribing your Xarelto.  In the future your dose may need to be changed if your kidney function or weight changes by a significant amount.  What do you do if you miss a dose? If you are taking Xarelto ONCE DAILY and you miss a dose, take it as soon as you remember on the same day then continue your regularly scheduled once daily regimen the next day. Do not take two doses of Xarelto at the same time or on the same  day.   Important Safety Information A possible side effect of Xarelto is bleeding. You should call your healthcare provider right away if you experience any of the following: ? Bleeding from an injury or your nose that does not stop. ? Unusual colored urine (red or dark brown) or unusual colored stools (red or black). ? Unusual bruising for unknown reasons. ? A serious fall or if you hit your head (even if there is no bleeding).  Some medicines may interact with Xarelto and might increase your risk of bleeding while on Xarelto. To help avoid this, consult your healthcare provider or pharmacist prior to using any new prescription or non-prescription medications, including herbals, vitamins, non-steroidal anti-inflammatory drugs (NSAIDs) and supplements.  This website has more information on Xarelto: VisitDestination.com.br.

## 2020-01-04 ENCOUNTER — Other Ambulatory Visit: Payer: Self-pay

## 2020-01-04 ENCOUNTER — Emergency Department (HOSPITAL_COMMUNITY)
Admission: EM | Admit: 2020-01-04 | Discharge: 2020-01-04 | Disposition: A | Discharge status: Home / Self Care | Payer: Medicare Other | Source: Home / Self Care

## 2020-01-04 DIAGNOSIS — I25710 Atherosclerosis of autologous vein coronary artery bypass graft(s) with unstable angina pectoris: Secondary | ICD-10-CM | POA: Diagnosis not present

## 2020-01-04 DIAGNOSIS — I10 Essential (primary) hypertension: Secondary | ICD-10-CM | POA: Diagnosis not present

## 2020-01-04 DIAGNOSIS — J449 Chronic obstructive pulmonary disease, unspecified: Secondary | ICD-10-CM | POA: Diagnosis not present

## 2020-01-04 DIAGNOSIS — R1084 Generalized abdominal pain: Secondary | ICD-10-CM | POA: Insufficient documentation

## 2020-01-04 DIAGNOSIS — R079 Chest pain, unspecified: Secondary | ICD-10-CM | POA: Diagnosis not present

## 2020-01-04 DIAGNOSIS — I25110 Atherosclerotic heart disease of native coronary artery with unstable angina pectoris: Secondary | ICD-10-CM | POA: Diagnosis not present

## 2020-01-04 DIAGNOSIS — Z5321 Procedure and treatment not carried out due to patient leaving prior to being seen by health care provider: Secondary | ICD-10-CM | POA: Insufficient documentation

## 2020-01-04 DIAGNOSIS — R000 Tachycardia, unspecified: Secondary | ICD-10-CM | POA: Diagnosis not present

## 2020-01-04 DIAGNOSIS — E785 Hyperlipidemia, unspecified: Secondary | ICD-10-CM | POA: Diagnosis not present

## 2020-01-04 DIAGNOSIS — M25512 Pain in left shoulder: Secondary | ICD-10-CM | POA: Insufficient documentation

## 2020-01-04 DIAGNOSIS — Z20822 Contact with and (suspected) exposure to covid-19: Secondary | ICD-10-CM | POA: Diagnosis not present

## 2020-01-04 DIAGNOSIS — I214 Non-ST elevation (NSTEMI) myocardial infarction: Secondary | ICD-10-CM | POA: Diagnosis not present

## 2020-01-04 LAB — CBC
HCT: 39 % (ref 36.0–46.0)
Hemoglobin: 13 g/dL (ref 12.0–15.0)
MCH: 31.7 pg (ref 26.0–34.0)
MCHC: 33.3 g/dL (ref 30.0–36.0)
MCV: 95.1 fL (ref 80.0–100.0)
Platelets: 181 10*3/uL (ref 150–400)
RBC: 4.1 MIL/uL (ref 3.87–5.11)
RDW: 12.8 % (ref 11.5–15.5)
WBC: 7.4 10*3/uL (ref 4.0–10.5)
nRBC: 0 % (ref 0.0–0.2)

## 2020-01-04 NOTE — ED Notes (Signed)
Pt notified staff that she is not willing to wait to be seen. This Clinical research associate encouraged pt to wait as she has not been here for long. Pt stated that she is not having pain and does not want to wait 4 hours to be seen. Pt visualized walking out of ED.

## 2020-01-04 NOTE — ED Triage Notes (Addendum)
Pt presents to ED BIB [facility]. Pt c/o generalized abd pain. Pt reports that pain begins while eating and goes to back then shoulder then to L arm. Fire dept given nitro x1 d/t sbp 190 and pain has alleviated since. Hx MI EMS VS -  130/91

## 2020-01-05 DIAGNOSIS — I201 Angina pectoris with documented spasm: Secondary | ICD-10-CM | POA: Diagnosis not present

## 2020-01-05 DIAGNOSIS — I200 Unstable angina: Secondary | ICD-10-CM | POA: Diagnosis not present

## 2020-01-05 DIAGNOSIS — R079 Chest pain, unspecified: Secondary | ICD-10-CM | POA: Diagnosis not present

## 2020-01-05 DIAGNOSIS — R0789 Other chest pain: Secondary | ICD-10-CM | POA: Diagnosis not present

## 2020-01-05 LAB — URINALYSIS, ROUTINE W REFLEX MICROSCOPIC
Bilirubin Urine: NEGATIVE
Glucose, UA: NEGATIVE mg/dL
Ketones, ur: NEGATIVE mg/dL
Leukocytes,Ua: NEGATIVE
Nitrite: NEGATIVE
Protein, ur: NEGATIVE mg/dL
Specific Gravity, Urine: 1.02 (ref 1.005–1.030)
pH: 5 (ref 5.0–8.0)

## 2020-01-05 LAB — COMPREHENSIVE METABOLIC PANEL
ALT: 26 U/L (ref 0–44)
AST: 24 U/L (ref 15–41)
Albumin: 3.4 g/dL — ABNORMAL LOW (ref 3.5–5.0)
Alkaline Phosphatase: 61 U/L (ref 38–126)
Anion gap: 9 (ref 5–15)
BUN: 7 mg/dL — ABNORMAL LOW (ref 8–23)
CO2: 25 mmol/L (ref 22–32)
Calcium: 9 mg/dL (ref 8.9–10.3)
Chloride: 107 mmol/L (ref 98–111)
Creatinine, Ser: 0.98 mg/dL (ref 0.44–1.00)
GFR, Estimated: >60 mL/min (ref >60)
Glucose, Bld: 134 mg/dL — ABNORMAL HIGH (ref 70–99)
Potassium: 3.4 mmol/L — ABNORMAL LOW (ref 3.5–5.1)
Sodium: 141 mmol/L (ref 135–145)
Total Bilirubin: 0.4 mg/dL (ref 0.3–1.2)
Total Protein: 6.1 g/dL — ABNORMAL LOW (ref 6.5–8.1)

## 2020-01-05 LAB — TROPONIN I (HIGH SENSITIVITY): Troponin I (High Sensitivity): 434 ng/L (ref <18)

## 2020-01-05 LAB — LIPASE, BLOOD: Lipase: 27 U/L (ref 11–51)

## 2020-01-06 ENCOUNTER — Ambulatory Visit (INDEPENDENT_AMBULATORY_CARE_PROVIDER_SITE_OTHER): Payer: Medicare Other | Admitting: Cardiology

## 2020-01-06 ENCOUNTER — Encounter: Payer: Self-pay | Admitting: Cardiology

## 2020-01-06 ENCOUNTER — Inpatient Hospital Stay (HOSPITAL_COMMUNITY)
Admission: EM | Admit: 2020-01-06 | Discharge: 2020-01-09 | DRG: 246 | Disposition: A | Discharge status: Home / Self Care | Payer: Medicare Other | Attending: Cardiology | Admitting: Cardiology

## 2020-01-06 ENCOUNTER — Emergency Department (HOSPITAL_COMMUNITY): Payer: Medicare Other

## 2020-01-06 ENCOUNTER — Other Ambulatory Visit: Payer: Self-pay

## 2020-01-06 ENCOUNTER — Encounter (HOSPITAL_COMMUNITY): Payer: Self-pay

## 2020-01-06 VITALS — BP 125/75 | HR 60 | Ht 61.0 in | Wt 127.0 lb

## 2020-01-06 DIAGNOSIS — F17210 Nicotine dependence, cigarettes, uncomplicated: Secondary | ICD-10-CM | POA: Diagnosis present

## 2020-01-06 DIAGNOSIS — E876 Hypokalemia: Secondary | ICD-10-CM | POA: Diagnosis not present

## 2020-01-06 DIAGNOSIS — R079 Chest pain, unspecified: Secondary | ICD-10-CM

## 2020-01-06 DIAGNOSIS — Z955 Presence of coronary angioplasty implant and graft: Secondary | ICD-10-CM

## 2020-01-06 DIAGNOSIS — Z7901 Long term (current) use of anticoagulants: Secondary | ICD-10-CM

## 2020-01-06 DIAGNOSIS — Z79890 Hormone replacement therapy (postmenopausal): Secondary | ICD-10-CM

## 2020-01-06 DIAGNOSIS — Z8674 Personal history of sudden cardiac arrest: Secondary | ICD-10-CM

## 2020-01-06 DIAGNOSIS — K589 Irritable bowel syndrome without diarrhea: Secondary | ICD-10-CM | POA: Diagnosis present

## 2020-01-06 DIAGNOSIS — J449 Chronic obstructive pulmonary disease, unspecified: Secondary | ICD-10-CM | POA: Diagnosis not present

## 2020-01-06 DIAGNOSIS — I10 Essential (primary) hypertension: Secondary | ICD-10-CM

## 2020-01-06 DIAGNOSIS — I2102 ST elevation (STEMI) myocardial infarction involving left anterior descending coronary artery: Secondary | ICD-10-CM | POA: Diagnosis not present

## 2020-01-06 DIAGNOSIS — I25110 Atherosclerotic heart disease of native coronary artery with unstable angina pectoris: Secondary | ICD-10-CM | POA: Diagnosis present

## 2020-01-06 DIAGNOSIS — R0789 Other chest pain: Secondary | ICD-10-CM | POA: Diagnosis not present

## 2020-01-06 DIAGNOSIS — I2510 Atherosclerotic heart disease of native coronary artery without angina pectoris: Secondary | ICD-10-CM | POA: Diagnosis not present

## 2020-01-06 DIAGNOSIS — Z79899 Other long term (current) drug therapy: Secondary | ICD-10-CM

## 2020-01-06 DIAGNOSIS — I429 Cardiomyopathy, unspecified: Secondary | ICD-10-CM | POA: Diagnosis present

## 2020-01-06 DIAGNOSIS — Z888 Allergy status to other drugs, medicaments and biological substances status: Secondary | ICD-10-CM

## 2020-01-06 DIAGNOSIS — I249 Acute ischemic heart disease, unspecified: Secondary | ICD-10-CM | POA: Insufficient documentation

## 2020-01-06 DIAGNOSIS — I214 Non-ST elevation (NSTEMI) myocardial infarction: Secondary | ICD-10-CM | POA: Diagnosis present

## 2020-01-06 DIAGNOSIS — I480 Paroxysmal atrial fibrillation: Secondary | ICD-10-CM

## 2020-01-06 DIAGNOSIS — E785 Hyperlipidemia, unspecified: Secondary | ICD-10-CM | POA: Diagnosis not present

## 2020-01-06 DIAGNOSIS — Z20822 Contact with and (suspected) exposure to covid-19: Secondary | ICD-10-CM | POA: Diagnosis present

## 2020-01-06 DIAGNOSIS — I25710 Atherosclerosis of autologous vein coronary artery bypass graft(s) with unstable angina pectoris: Principal | ICD-10-CM | POA: Diagnosis present

## 2020-01-06 DIAGNOSIS — R931 Abnormal findings on diagnostic imaging of heart and coronary circulation: Secondary | ICD-10-CM | POA: Diagnosis not present

## 2020-01-06 DIAGNOSIS — I252 Old myocardial infarction: Secondary | ICD-10-CM

## 2020-01-06 DIAGNOSIS — I25700 Atherosclerosis of coronary artery bypass graft(s), unspecified, with unstable angina pectoris: Secondary | ICD-10-CM

## 2020-01-06 DIAGNOSIS — K219 Gastro-esophageal reflux disease without esophagitis: Secondary | ICD-10-CM | POA: Diagnosis present

## 2020-01-06 DIAGNOSIS — I4891 Unspecified atrial fibrillation: Secondary | ICD-10-CM | POA: Diagnosis not present

## 2020-01-06 DIAGNOSIS — Z91041 Radiographic dye allergy status: Secondary | ICD-10-CM

## 2020-01-06 DIAGNOSIS — R000 Tachycardia, unspecified: Secondary | ICD-10-CM | POA: Diagnosis not present

## 2020-01-06 DIAGNOSIS — E039 Hypothyroidism, unspecified: Secondary | ICD-10-CM | POA: Diagnosis present

## 2020-01-06 DIAGNOSIS — I200 Unstable angina: Secondary | ICD-10-CM | POA: Diagnosis present

## 2020-01-06 HISTORY — DX: Acute ischemic heart disease, unspecified: I24.9

## 2020-01-06 MED ORDER — ISOSORBIDE MONONITRATE ER 30 MG PO TB24: 30.0000 mg | ORAL_TABLET | Freq: Every day | ORAL | 4 refills | Status: DC

## 2020-01-06 MED ORDER — PREDNISONE 50 MG PO TABS: ORAL_TABLET | ORAL | 0 refills | Status: DC

## 2020-01-06 MED ORDER — ISOSORBIDE MONONITRATE ER 30 MG PO TB24: 30.0000 mg | ORAL_TABLET | Freq: Every day | ORAL | 6 refills | Status: DC

## 2020-01-06 NOTE — Assessment & Plan Note (Signed)
Reduced EF with regional wall motion abnormality in the setting of [name] RVR but also with chest discomfort and elevated troponin.  Concerning for recent ACS.  She has now had recurrent chest pain.  Plan is for cardiac catheterization.

## 2020-01-06 NOTE — ED Provider Notes (Signed)
[facility] EMERGENCY DEPARTMENT Provider Note   CSN: [id number] Arrival date & time: 01/06/20  2230     History No chief complaint on file.   Dominique Barber is a 65 y.o. female.  65 y/o female with hx of CAD s/p CABG, HTN, HLD, PAF, COPD, GERD, IBS, tobacco abuse presents to the ED for evaluation of chest pain. Patient has had intermittent chest pain since hospital admission on 01/02/20. States that her symptoms always begin with bloating which can be aggravated by eating, but never by exertion. Bloating will often progress to pain in her lower central chest/epigastrium. Radiates directly through to her back and up to her b/l shoulders and down b/l posterior upper arms. Had echocardiogram during recent admission showing Grade II diastolic dysfunction. Initially scheduled for MyoView, but saw cardiology (Dr. Herbie Baltimore) today and is scheduled for a heart catheterization on Friday. Pain began again tonight and was relieved by NTG; took 1 tab at 1800, 1930, and 2040. Last NTG taken with her Xarelto and Metoprolol. Pain has not since recurred. Feels SOB with pain at times. Denies fever, syncope, diaphoresis, vomiting, extremity numbness or weakness. Was supposed to start Imdur tonight pending planned catheterization, but has not yet taken this medicine.  Cardiology hx per Care Everywhere: STEMI 1998 w/ VF arrest & anoxic brain injury w/ status epilepticus, s/p blow-by w/ BMS LAD 1 mo later. ISR>> CABG 1998 w/ LIMA-LAD & SVG-D1, AMI w/ emergent re-operation w/ SVG-LAD 1998        Past Medical History:  Diagnosis Date  . CAD in native artery 1998   Followup 1 month post MI revealed 95% ISR in PTCA site --> PCI with 3.0 mm x 25 mm BMS stent in proximal LAD --> 5 months later recurrent ISR of LAD BMS that compromised D1 --> referred for CABG .--> Emergent redo single vessel CABG with SVG-mLAD for occluded LIMA  . COPD (chronic obstructive pulmonary disease) (HCC)   . Elevated  troponin 01/03/2020  . GERD (gastroesophageal reflux disease)   . Hyperlipidemia   . Hypothyroidism    On Synthroid  . Iron deficiency anemia   . Irritable bowel syndrome    Followed by Dr. Charna Elizabeth.  . LV dysfunction 01/03/2020  . PAF (paroxysmal atrial fibrillation) (HCC) 05/03/2014   New onset - originally with RVR  . S/P CABG x 2 07/1996   Initially LIMA-LAD, SVG-D1 --> immediate LIMA failure post CABG with anterior MI --> urgent redo SVG-LAD beyond D2.; Widely patent grafts as of 2005 with left dominant system. Graft to the diagonal branch has a very small target vessel but the vein graft to LAD retrograde fills a large bifurcating D2 and antegrade fills a large D3.  . ST elevation (STEMI) myocardial infarction involving left anterior descending coronary artery (HCC) 01/19/1996   Complicated by VF arrest, and anoxic brain injury with status epilepticus; POBA of LAD --> 6 months later, after 2 vessel CABG she had postop complication with occluded LIMA/second anterior STEMI  . Tobacco abuse     Patient Active Problem List   Diagnosis Date Noted  . Abnormal echocardiogram 01/06/2020  . Acute coronary syndrome (HCC) 01/06/2020  . LV dysfunction 01/03/2020  . Elevated troponin 01/03/2020  . Atrial fibrillation with rapid ventricular response (HCC) 01/02/2020  . Essential hypertension 06/14/2019  . Persistent atrial fibrillation with rapid ventricular response (HCC) 05/14/2014  . Paroxysmal atrial fibrillation (HCC); CHA2DS2-VASc Score 3. On Xarelto 05/04/2014  . COPD (chronic obstructive pulmonary disease) (HCC)  05/04/2014  . Hyperlipidemia with target LDL less than 70   . Tobacco abuse   . Hypothyroid 12/03/2012  . Multinodular goiter 12/03/2012  . CAD- 100% LAD, s/p SVG-LAD after failed LIMA-LAD 11/14/1996    Class: Chronic  . S/P CABG x 2 07/03/1996  . ST elevation (STEMI) myocardial infarction involving left anterior descending coronary artery (HCC) 01/19/1996    Past  Surgical History:  Procedure Laterality Date  . APPENDECTOMY    . BREAST BIOPSY Right   . CARDIAC CATHETERIZATION  07/30/1996   normal L main, LAD w/95% stenosis just before stent, diffuse 80-85% end-stent restenosis, 1st diagonal with70-75% ostial stenosis, large optional diagonal that was normal, Cfx with 2 marginals and distal PLA (all normal), RCA normal (Dr. Jonette Eva)  . CARDIAC CATHETERIZATION  01/31/2003   LAD totally occluded in prox 3rd, patent Cfx, SVG to mid LAd patent, SVG to small DX1 patent, LIMA to LAD was atretic and essentially occluded in junction of prox & mid 3rd, R barciocephalic and subclavian were normal (Dr. Jonette Eva)  . CESAREAN SECTION    . CHOLECYSTECTOMY    . CORONARY ANGIOPLASTY  01/19/1996   acute anterior wall MI, anoxic encephalopahty, VF; LCfx free of disease and dominant, RCA patent, L main short and patent, LAD with 70-80% narrowing and focal 95% stenosis in distal 3rd with balloon angioplasty (Dr. Jonette Eva)  . CORONARY ANGIOPLASTY WITH STENT PLACEMENT  02/26/1996   3.0x56mm Multi-Link stent to prox/mid LAD; L main normal & short, Cfx dominant and normal, PDA & PLA normal, RCA non-dominant with small PLA and normal (Dr. Jonette Eva)  . CORONARY ARTERY BYPASS GRAFT  07/31/1996   x2; LIMA to LAD and SVG to 1st diagonal (Dr. Molinda Bailiff)  . CORONARY ARTERY BYPASS GRAFT  07/31/1996   x1; SVG to distal LAD (Dr. Molinda Bailiff)  . NM MYOCAR PERF WALL MOTION  10/2010; 06/2014   a) LexiScan Cardiolite - mild perfusion defect r/t infarct/scar with mild periifarct ischemia in mid anterior & apical anterior region; EF 67%; abnormal, low risk study;; b) Small, moderate intensity Fixed perfusion defect - mid Anterior Wall c/w known Anterior MI. LOW RISK   . OVARIAN CYST REMOVAL    . TRANSTHORACIC ECHOCARDIOGRAM  02/2012; 06/2014   a) EF 50-55%, mild HK of anterospetal myocardium; mild MR;; b) EF 55-60%, mild HK of apical anterior wall.   Mild MR     OB History   No obstetric  history on file.     Family History  Problem Relation Age of Onset  . Heart failure Other   . Diabetes Other   . Diabetes Mother   . Breast cancer Mother   . Thyroid disease Paternal Aunt   . Thyroid disease Maternal Grandmother     Social History   Tobacco Use  . Smoking status: Current Some Day Smoker    Packs/day: 0.25    Types: Cigarettes  . Smokeless tobacco: Former Neurosurgeon  . Tobacco comment: Quit at time of MI, restarted in 2009  Substance Use Topics  . Alcohol use: No    Alcohol/week: 0.0 standard drinks    Home Medications Prior to Admission medications   Medication Sig Start Date End Date Taking? Authorizing Provider  acetaminophen (TYLENOL) 500 MG tablet Take 500 mg by mouth every 6 (six) hours as needed for mild pain.    [provider]  alum & mag hydroxide-simeth (MAALOX/MYLANTA) 200-200-20 MG/5ML suspension Take 15-30 mLs by mouth every 6 (six) hours as  needed for indigestion or flatulence.    [provider]  Calcium Carb-Cholecalciferol (CALCIUM+D3 PO) Take 1 tablet by mouth daily with breakfast.    [provider]  cetirizine (ZYRTEC) 10 MG tablet TAKE 1 TABLET DAILY Patient taking differently: Take 10 mg by mouth daily. 11/27/17   Reather Littler, MD  hyoscyamine (LEVSIN) 0.125 MG tablet Take 1 tablet (0.125 mg total) by mouth 2 (two) times daily. 01/03/20   Leone Brand, NP  isosorbide mononitrate (IMDUR) 30 MG 24 hr tablet Take 1 tablet (30 mg total) by mouth at bedtime. Take 30 minutes after acetaminophen dose 01/06/20 04/05/20  Marykay Lex, MD  levothyroxine (SYNTHROID) 50 MCG tablet Take 1.5 tablets (75 mcg total) by mouth daily before breakfast. TAKE ONE AND ONE-HALF TABLETS DAILY BEFORE BREAKFAST 01/03/20   Leone Brand, NP  metoprolol succinate (TOPROL-XL) 50 MG 24 hr tablet Take 1 tablet (50 mg total) by mouth 2 (two) times daily. Take with or immediately following a meal. 01/03/20   Leone Brand, NP  nicotine (NICODERM  CQ - DOSED IN MG/24 HOURS) 14 mg/24hr patch Place 1 patch (14 mg total) onto the skin daily. 01/04/20   Leone Brand, NP  nitroGLYCERIN (NITROSTAT) 0.4 MG SL tablet Place 1 tablet (0.4 mg total) under the tongue every 5 (five) minutes x 3 doses as needed for chest pain. 01/03/20   Leone Brand, NP  NON FORMULARY Take 1 capsule by mouth See admin instructions. Renew Life Women's Probiotics- Take 1 capsule by mouth once a day    [provider]  NON FORMULARY Take 1 capsule by mouth See admin instructions. NOW Supplements/Peppermint Gels with Ginger & Fennel Oils, Enteric Coated, Digestive Support- Take 1 capsule by mouth before each meal    [provider]  potassium chloride (KLOR-CON) 10 MEQ tablet Take 1 tablet (10 mEq total) by mouth at bedtime. 01/03/20   Leone Brand, NP  predniSONE (DELTASONE) 50 MG tablet Please take Prednisone 50mg  by mouth at: Thirteen hours prior to cath 9: 30 pm on Thursday,Seven hours prior to cath 3:30 am on Friday and prior to leaving home please take last dose of Prednisone 50mg  and Benadryl 50mg  by mouth. 8:30 am 01/06/20   Marykay Lex, MD  rivaroxaban (XARELTO) 20 MG TABS tablet Take 1 tablet (20 mg total) by mouth daily with supper. Patient taking differently: Take 20 mg by mouth daily. 12/25/18   Jodelle Gross, NP  rosuvastatin (CRESTOR) 20 MG tablet Take 1 tablet (20 mg total) by mouth daily. 12/25/18   Jodelle Gross, NP  Simethicone 125 MG TABS Chew 125 mg by mouth See admin instructions. Chew 125 mg by mouth three times a day after meals as needed for gas    [provider]  SYMAX-SR 0.375 MG 12 hr tablet Take 0.375 mg by mouth 2 (two) times daily. 12/14/19   [provider]    Allergies    Atorvastatin, Eluxadoline, and Iohexol  Review of Systems   Review of Systems  Ten systems reviewed and are negative for acute change, except as noted in the HPI.    Physical Exam Updated Vital Signs BP (!)  143/69   Pulse (!) 50   Temp 98.4 F (36.9 C) (Oral)   Resp 18   Ht 5\' 1"  (1.549 m)   Wt 57.6 kg   SpO2 97%   BMI 24.00 kg/m   Physical Exam Vitals and nursing note reviewed.  Constitutional:  General: She is not in acute distress.    Appearance: She is well-developed and well-nourished. She is not diaphoretic.     Comments: Nontoxic appearing and in NAD  HENT:     Head: Normocephalic and atraumatic.  Eyes:     General: No scleral icterus.    Extraocular Movements: EOM normal.     Conjunctiva/sclera: Conjunctivae normal.  Cardiovascular:     Rate and Rhythm: Normal rate and regular rhythm.     Pulses: Normal pulses.  Pulmonary:     Effort: Pulmonary effort is normal. No respiratory distress.     Breath sounds: No stridor. No wheezing.     Comments: Respirations even and unlabored Musculoskeletal:        General: Normal range of motion.     Cervical back: Normal range of motion.  Skin:    General: Skin is warm and dry.     Coloration: Skin is not pale.     Findings: No erythema or rash.  Neurological:     Mental Status: She is alert and oriented to person, place, and time.     Coordination: Coordination normal.  Psychiatric:        Mood and Affect: Mood and affect normal.        Behavior: Behavior normal.     ED Results / Procedures / Treatments   Labs (all labs ordered are listed, but only abnormal results are displayed) Labs Reviewed  PROTIME-INR - Abnormal; Notable for the following components:      Result Value   Prothrombin Time 27.9 (*)    INR 2.7 (*)    All other components within normal limits  COMPREHENSIVE METABOLIC PANEL - Abnormal; Notable for the following components:   Glucose, Bld 106 (*)    BUN <5 (*)    All other components within normal limits  TROPONIN I (HIGH SENSITIVITY) - Abnormal; Notable for the following components:   Troponin I (High Sensitivity) 125 (*)    All other components within normal limits  TROPONIN I (HIGH  SENSITIVITY) - Abnormal; Notable for the following components:   Troponin I (High Sensitivity) 134 (*)    All other components within normal limits  RESP PANEL BY RT-PCR (FLU A&B, COVID) ARPGX2  CBC WITH DIFFERENTIAL/PLATELET  LIPASE, BLOOD    EKG EKG Interpretation  Date/Time:  Monday January 06 2020 22:38:39 EST Ventricular Rate:  56 PR Interval:    QRS Duration: 112 QT Interval:  433 QTC Calculation: 418 R Axis:   76 Text Interpretation: Sinus rhythm Borderline intraventricular conduction delay Abnrm T, consider ischemia, anterolateral lds Confirmed by Blane Ohara [phone]) on 01/06/2020 10:43:32 PM   Radiology DG Chest Port 1 View  Result Date: 01/06/2020 CLINICAL DATA:  Chest pain EXAM: PORTABLE CHEST 1 VIEW COMPARISON:  01/02/2020 FINDINGS: The lungs are symmetrically mildly hyperinflated. Stable right apical cyst. No pneumothorax or pleural effusion. Coronary artery bypass grafting has been performed. Cardiac size within normal limits. Pulmonary vascularity is normal. No acute bone abnormality. IMPRESSION: No active disease.  COPD. Electronically Signed   By: Helyn Numbers MD   On: 01/06/2020 23:12     Echocardiogram 01/02/20 IMPRESSIONS  1. Left ventricular ejection fraction, by estimation, is 45 to 50%. The  left ventricle has mildly decreased function. The left ventricle  demonstrates regional wall motion abnormalities (see scoring  diagram/findings for description). Left ventricular  diastolic parameters are consistent with Grade II diastolic dysfunction  (pseudonormalization).  2. Right ventricular systolic function is normal. The right  ventricular  size is normal.  3. The mitral valve is normal in structure. Trivial mitral valve  regurgitation. No evidence of mitral stenosis.  4. The aortic valve is normal in structure. Aortic valve regurgitation is  not visualized. No aortic stenosis is present.  5. The inferior vena cava is normal in size with greater than  50%  respiratory variability, suggesting right atrial pressure of 3 mmHg.    Procedures Procedures (including critical care time)  Medications Ordered in ED Medications - No data to display  ED Course  I have reviewed the triage vital signs and the nursing notes.  Pertinent labs & imaging results that were available during my care of the patient were reviewed by me and considered in my medical decision making (see chart for details).  Clinical Course as of 01/07/20 0148  Tue Jan 07, 2020  0124 Troponin 125. While elevated, this is actually down trending from her admission a few days ago. [KH]  1610 Spoke with Dr. Julianne Handler of Cardiology. Will assess patient in ED with plan to admit and expedite catheterization. [KH]    Clinical Course User Index [KH] Antony Madura, PA-C   MDM Rules/Calculators/A&P                          65 y/o presenting for persistent chest pain. Recent admission for NSTEMI with plan for heart catheterization on Friday. Pain has been relieved since last NTG at 2040. Troponin today is down from previous on 01/04/20. She has been hemodynamically stable. Case discussed with Cardiology who will assess patient in ED for admission; plan to expedite catheterization while inpatient. Patient agreeable to plan.   Final Clinical Impression(s) / ED Diagnoses Final diagnoses:  Chest pain, unspecified type    Rx / DC Orders ED Discharge Orders    None       Antony Madura, PA-C 01/07/20 0152    Shon Baton, MD 01/08/20 [phone]

## 2020-01-06 NOTE — Assessment & Plan Note (Signed)
Blood pressure stable on current dose metoprolol.  Tolerating well.

## 2020-01-06 NOTE — ED Notes (Signed)
Pt is a hard stick, IV team notified.

## 2020-01-06 NOTE — Assessment & Plan Note (Signed)
Recent admission with [name] RVR-spontaneously converted with diltiazem.  Beta-blocker dose increased to 50 mg twice daily metoprolol.  Remains on Xarelto. Continue increased dose of beta-blocker.  Planning for cardiac catheterization.  Xarelto will be held for cath

## 2020-01-06 NOTE — Progress Notes (Signed)
Primary Care Provider: Nicolette Bang, DO Cardiologist: Glenetta Hew, MD Electrophysiologist: None  Clinic Note: Chief Complaint  Patient presents with   Hospitalization Follow-up    Admitted with [name]-RVR as well as chest pain/abdominal pain.  Abnormal EKG changes with reduced EF on echo and elevated troponin.   Chest Pain   Elevated troponin   Abnormal echocardiogram   Problem List Items Addressed This Visit    CAD- 100% LAD, s/p SVG-LAD after failed LIMA-LAD (Chronic)   Hyperlipidemia with target LDL less than 70 (Chronic)   Paroxysmal atrial fibrillation ([place]); CHA2DS2-VASc Score 3. On Xarelto (Chronic)   ST elevation (STEMI) myocardial infarction involving left anterior descending coronary artery (HCC) (Chronic)   Essential hypertension (Chronic)   Atrial fibrillation with rapid ventricular response (HCC)   Elevated troponin   Hypokalemia   Abnormal echocardiogram   Acute coronary syndrome (HCC) - Primary      HPI:    ALEXY BRINGLE is a 65 y.o. female with a PMH below who presents today for hospital follow-up after presenting with [name] RVR as well as ACUTE CORONARY SYNDROME with elevated troponin, dynamic EKG changes and reduced EF on echocardiogram with regional wall motion abnormality.  STEMI 1998w/ VF arrest & anoxic brain injury w/ status epilepticus, s/p blow-by w/ BMS LAD 1 mo later. ISR>>CABG 1998 w/ LIMA-LAD & SVG-D1,AMI w/emergent re-operation w/ SVG-LAD 1998, PAF, COPD, GERD, IBS, HTN, HLD   Lorrie C Salsgiver was last seen on June 07, 2019 for routine follow-up only noting GI issues with bloating and gas as well as intermittent constipation/loose stools.  All from her IBD.  No cardiac symptoms.  Recent Hospitalizations:   December 31, 2019: Urgent care visit with upper abdominal pain worsening over the last several days, started getting bad after Thanksgiving.  She does note relief after a BM, but BMs have become less frequent.   Worsening appetite, not able to eat as much, nauseated with distention.  December 30-31st 2021: Admitted with [name] RVR -> she had upper abdominal pain that resolved with conversion to sinus rhythm.  She had troponin elevation of 1309, reduced EF on echocardiogram with regional wall motion abnormality and dynamic ST and T wave changes with T wave inversions on EKG..  Plan was for outpatient [facility] as she was very adamant about leaving.  Patient declined cardiac catheterization.    She also was having IBS flare triggered by eating a [employer] fish sandwich on December 27.  Symptoms started getting worse after that.  She was not able to eat much solid.  There was also some associated chest pain.  After returning for a walk she started to feel her heart fluttering and fast heart rate that was regular.  Not having any chest pain or dyspnea heart rates were in the 130s 140s --> was fearful of having cardioversion in the ER because of her history of cardiac arrest requiring defibrillation. ->  She spontaneously converted with IV diltiazem => home dose metoprolol increased to 50 mg twice daily, and told to stay on Xarelto.  January 04, 2020: Transported to ER by EMS, but decided to go home as opposed to waiting in the ER for multiple hours.  She was having chest discomfort at that time that was relieved with nitroglycerin.  January 05, 2020: Contacted EMS because of chest pain and abdominal pain.  Chest pain reviewed with nitroglycerin.  EKG no longer showed lateral T wave inversions -> her husband brought in the  EMS EKG for [place] to scan.  Reviewed  CV studies:    The following studies were reviewed today: (if available, images/films reviewed: From Epic Chart or Care Everywhere)  Echo 01/02/2020: EF 45 to 50%.  Mild decreased function.  GRII DD.  Apical septal, inferior and apical akinesis.:  Interval History:   BEREA MAJKOWSKI presents here today for hospital follow-up indicating that she is  really been feeling poorly since about last Tuesday.  She is having several different symptoms:  Epigastric tightness and bloating sensation that radiates around to the back and then up into the shoulders down both arms.  This comes and goes, not associated with exertion.  Interestingly, it has been relieved with sublingual NTG.  Discomfort is truly epigastric and occasionally goes into the lower chest.    She has been taking the PPI, Gas-X and her usual home regimen for IBD, but has not necessarily improved her symptoms.  She had the rapid atrial fibrillation where she felt jittery, and literally felt the rapid heart rate was.  No chest pain -> however her abdominal discomfort thought to be IBD related with resolved with conversion to sinus rhythm  Is also noted central substernal discomfort radiating to the upper chest-and on occasion on the arms.  These are also nitroglycerin sensitive.  Since her discharge from the hospital she has had 2 times where she is contacted EMS as reviewed above.  She has had pretty much consistent epigastric bloating and tightness and discomfort that mostly goes to the back, but on occasion goes up into the chest and then down both arms.  She has not had really that exerting herself very much and therefore has not noted significant worsening with exertion.  These episodes of the chest discomfort and not long lasting, and are r relieved with nitroglycerin.  She denies any further rapid irregular heartbeats palpitations and suspected recurrence of [name].  No real PND orthopnea.  Not really walking up to notice any exertional dyspnea.  Mild edema but not significant.  No syncope/near syncope or TIA/amaurosis fugax.  No claudication.  No melena, hematochezia, hematuria, or epistaxis.  The patient does not have symptoms concerning for COVID-19 infection (fever, chills, cough, or new shortness of breath).   REVIEWED OF SYSTEMS   Review of Systems  Constitutional:  Positive for malaise/fatigue. Negative for weight loss.  HENT: Negative for congestion and sinus pain.   Respiratory: Positive for shortness of breath. Negative for cough and wheezing.   Cardiovascular: Positive for chest pain. Negative for claudication and leg swelling.  Gastrointestinal: Positive for abdominal pain, constipation, diarrhea and nausea. Negative for blood in stool and melena.       Bloating, gas, distention, IBD  Genitourinary: Negative for hematuria.  Musculoskeletal: Positive for joint pain.  Neurological: Positive for dizziness and headaches. Negative for focal weakness.  Psychiatric/Behavioral: Positive for memory loss. Negative for depression. The patient is nervous/anxious and has insomnia.     I have reviewed and (if needed) personally updated the patient's problem list, medications, allergies, past medical and surgical history, social and family history.   PAST MEDICAL HISTORY   Past Medical History:  Diagnosis Date   CAD in native artery 1998   Followup 1 month post MI revealed 95% ISR in PTCA site --> PCI with 3.0 mm x 25 mm BMS stent in proximal LAD --> 5 months later recurrent ISR of LAD BMS that compromised D1 --> referred for CABG .--> Emergent redo single vessel CABG with SVG-mLAD  for occluded LIMA   COPD (chronic obstructive pulmonary disease) (HCC)    Elevated troponin 01/03/2020   GERD (gastroesophageal reflux disease)    Hyperlipidemia    Hypothyroidism    On Synthroid   Iron deficiency anemia    Irritable bowel syndrome    Followed by Dr. Charna Elizabeth.   LV dysfunction 01/03/2020   PAF (paroxysmal atrial fibrillation) (HCC) 05/03/2014   New onset - originally with RVR   S/P CABG x 2 07/1996   Initially LIMA-LAD, SVG-D1 --> immediate LIMA failure post CABG with anterior MI --> urgent redo SVG-LAD beyond D2.; Widely patent grafts as of 2005 with left dominant system. Graft to the diagonal branch has a very small target vessel but the vein  graft to LAD retrograde fills a large bifurcating D2 and antegrade fills a large D3.   ST elevation (STEMI) myocardial infarction involving left anterior descending coronary artery (HCC) 01/19/1996   Complicated by VF arrest, and anoxic brain injury with status epilepticus; POBA of LAD --> 6 months later, after 2 vessel CABG she had postop complication with occluded LIMA/second anterior STEMI   Tobacco abuse    Past Cardiac History: ? Anterior STEMI 31 (age 67):She had EF arrest with cardiac shock and mild anoxic brain injury (with status epilepticus). -->Emergent PTCA to the LAD ? She then had BMS stent placed in February 1998 ? Shortly thereafter she underwent CABG with the LIMA-LAD and SVG-D2. - For in-stent restenosis  Postop complication -- acute occlusion of the LIMA graft ->recurrent anterior MI ->redo CABG with SVG-mLAD.   Most recent Cath Jan 2005: Patent SVG-D1 &SVG-mLAD- (no significant change in disease). Large bifurcating D1 branch that is filled via retrograde flow from the LAD. Dominant circumflex with small OM1 large bifurcating OM 2 as well as a large OM 3 before terminating in a bifurcation into LPL and large PDA. The LIMA LAD was atretic/occluded. ->EF roughly 50% with inferoapical hypokinesis.  Myoview June 2016 - Small mid anterior wall scar without ischemia. Low risk study  Afib - April 2016 - now on Xarelto. By her report, since her CABG she has been troubled with IBS  PAST SURGICAL HISTORY   Past Surgical History:  Procedure Laterality Date   APPENDECTOMY     BREAST BIOPSY Right    CARDIAC CATHETERIZATION  07/30/1996   normal L main, LAD w/95% stenosis just before stent, diffuse 80-85% end-stent restenosis, 1st diagonal with70-75% ostial stenosis, large optional diagonal that was normal, Cfx with 2 marginals and distal PLA (all normal), RCA normal (Dr. Jonette Eva)   CARDIAC CATHETERIZATION  01/31/2003   LAD totally occluded in prox 3rd, patent  Cfx, SVG to mid LAd patent, SVG to small DX1 patent, LIMA to LAD was atretic and essentially occluded in junction of prox & mid 3rd, R barciocephalic and subclavian were normal (Dr. Jonette Eva)   CESAREAN SECTION     CHOLECYSTECTOMY     CORONARY ANGIOPLASTY  01/19/1996   acute anterior wall MI, anoxic encephalopahty, VF; LCfx free of disease and dominant, RCA patent, L main short and patent, LAD with 70-80% narrowing and focal 95% stenosis in distal 3rd with balloon angioplasty (Dr. Jonette Eva)   CORONARY ANGIOPLASTY WITH STENT PLACEMENT  02/26/1996   3.0x50mm Multi-Link stent to prox/mid LAD; L main normal & short, Cfx dominant and normal, PDA & PLA normal, RCA non-dominant with small PLA and normal (Dr. Jonette Eva)   CORONARY ARTERY BYPASS GRAFT  07/31/1996   x2; LIMA to LAD  and SVG to 1st diagonal (Dr. Molinda Bailiff)   CORONARY ARTERY BYPASS GRAFT  07/31/1996   x1; SVG to distal LAD (Dr. Molinda Bailiff)   NM [facility] PERF WALL MOTION  10/2010; 06/2014   a) LexiScan Cardiolite - mild perfusion defect r/t infarct/scar with mild periifarct ischemia in mid anterior & apical anterior region; EF 67%; abnormal, low risk study;; b) Small, moderate intensity Fixed perfusion defect - mid Anterior Wall c/w known Anterior MI. LOW RISK    OVARIAN CYST REMOVAL     TRANSTHORACIC ECHOCARDIOGRAM  02/2012; 06/2014   a) EF 50-55%, mild HK of anterospetal myocardium; mild MR;; b) EF 55-60%, mild HK of apical anterior wall.   Mild MR    Immunization History  Administered Date(s) Administered   Influenza,inj,Quad PF,6+ Mos 12/04/2013, 12/05/2014, 10/10/2017   Influenza-Unspecified 01/03/2010   Tdap 01/03/2013    MEDICATIONS/ALLERGIES   Current Meds  Medication Sig   acetaminophen (TYLENOL) 500 MG tablet Take 500 mg by mouth every 6 (six) hours as needed for mild pain.   alum & mag hydroxide-simeth (MAALOX/MYLANTA) 200-200-20 MG/5ML suspension Take 15-30 mLs by mouth every 6 (six) hours as needed for  indigestion or flatulence.   Calcium Carb-Cholecalciferol (CALCIUM+D3 PO) Take 1 tablet by mouth daily with breakfast.   cetirizine (ZYRTEC) 10 MG tablet TAKE 1 TABLET DAILY (Patient taking differently: Take 10 mg by mouth daily.)   hyoscyamine (LEVSIN) 0.125 MG tablet Take 1 tablet (0.125 mg total) by mouth 2 (two) times daily.   isosorbide mononitrate (IMDUR) 30 MG 24 hr tablet Take 1 tablet (30 mg total) by mouth at bedtime. Take 30 minutes after acetaminophen dose   levothyroxine (SYNTHROID) 50 MCG tablet Take 1.5 tablets (75 mcg total) by mouth daily before breakfast. TAKE ONE AND ONE-HALF TABLETS DAILY BEFORE BREAKFAST   metoprolol succinate (TOPROL-XL) 50 MG 24 hr tablet Take 1 tablet (50 mg total) by mouth 2 (two) times daily. Take with or immediately following a meal.   nicotine (NICODERM CQ - DOSED IN MG/24 HOURS) 14 mg/24hr patch Place 1 patch (14 mg total) onto the skin daily.   nitroGLYCERIN (NITROSTAT) 0.4 MG SL tablet Place 1 tablet (0.4 mg total) under the tongue every 5 (five) minutes x 3 doses as needed for chest pain.   NON FORMULARY Take 1 capsule by mouth See admin instructions. Renew Life Women's Probiotics- Take 1 capsule by mouth once a day   NON FORMULARY Take 1 capsule by mouth See admin instructions. NOW Supplements/Peppermint Gels with Ginger & Fennel Oils, Enteric Coated, Digestive Support- Take 1 capsule by mouth before each meal   potassium chloride (KLOR-CON) 10 MEQ tablet Take 1 tablet (10 mEq total) by mouth at bedtime.   predniSONE (DELTASONE) 50 MG tablet Please take Prednisone 50mg  by mouth at: Thirteen hours prior to cath 9: 30 pm on Thursday,Seven hours prior to cath 3:30 am on Friday and prior to leaving home please take last dose of Prednisone 50mg  and Benadryl 50mg  by mouth. 8:30 am   rivaroxaban (XARELTO) 20 MG TABS tablet Take 1 tablet (20 mg total) by mouth daily with supper. (Patient taking differently: Take 20 mg by mouth daily.)    rosuvastatin (CRESTOR) 20 MG tablet Take 1 tablet (20 mg total) by mouth daily.   Simethicone 125 MG TABS Chew 125 mg by mouth See admin instructions. Chew 125 mg by mouth three times a day after meals as needed for gas   SYMAX-SR 0.375 MG 12 hr tablet Take 0.375 mg  by mouth 2 (two) times daily.   [DISCONTINUED] isosorbide mononitrate (IMDUR) 30 MG 24 hr tablet Take 1 tablet (30 mg total) by mouth at bedtime.    Allergies  Allergen Reactions   Atorvastatin Other (See Comments)    Gas pain in abdomen with no relief   Eluxadoline Nausea And Vomiting and Other (See Comments)    Viberzi- And, made the patient feel "high"   Iohexol Hives and Rash    Red dye    SOCIAL HISTORY/FAMILY HISTORY   Reviewed in Epic:  Pertinent findings:  Social History   Tobacco Use   Smoking status: Current Some Day Smoker    Packs/day: 0.25    Types: Cigarettes   Smokeless tobacco: Former Neurosurgeon   Tobacco comment: Quit at time of MI, restarted in 2009  Substance Use Topics   Alcohol use: No    Alcohol/week: 0.0 standard drinks   Social History   Social History Narrative   Married, mother of 3 children (2 living sons age 75-26 and 30-31; her daughter was the victim of a murder that took place sometime around the time of the patient's MI. She was under significant amount of stress as her daughter had gone missing. She was never found. Finally the suspect was charged and convicted in 2009. She had sessile he stop smoking until that time frame when it brought back memories and she is now back to smoking a half pack a day.   She does not get routine exercise, but is active around the house and doing housecleaning chores.   She does not drink alcohol.    OBJCTIVE -PE, EKG, labs   Wt Readings from Last 3 Encounters:  01/06/20 127 lb (57.6 kg)  01/03/20 123 lb 9.6 oz (56.1 kg)  06/07/19 133 lb (60.3 kg)    Physical Exam: BP 125/75    Pulse 60    Ht 5\' 1"  (1.549 m)    Wt 127 lb (57.6 kg)    SpO2  98%    BMI 24.00 kg/m  Physical Exam Vitals reviewed.  Constitutional:      General: She is not in acute distress.    Appearance: Normal appearance. She is normal weight. She is not ill-appearing or toxic-appearing.  HENT:     Head: Normocephalic and atraumatic.  Neck:     Vascular: No carotid bruit, hepatojugular reflux or JVD.  Cardiovascular:     Rate and Rhythm: Normal rate and regular rhythm.  No extrasystoles are present.    Chest Wall: PMI is not displaced (Difficult to assess).     Pulses: Intact distal pulses.     Heart sounds: S1 normal and S2 normal. Heart sounds are distant. No murmur heard. No gallop. No S4 sounds.   Pulmonary:     Effort: Pulmonary effort is normal. No respiratory distress.     Breath sounds: Normal breath sounds.  Chest:     Chest wall: No tenderness.  Abdominal:     General: Abdomen is flat. There is distension.     Tenderness: There is abdominal tenderness (Epigastric). There is right CVA tenderness.     Comments: NABS  Musculoskeletal:        General: Swelling (Trivial bilateral ankle) present. Normal range of motion.     Cervical back: Normal range of motion and neck supple.  Skin:    General: Skin is warm and dry.  Neurological:     General: No focal deficit present.     Mental Status: She is  alert and oriented to person, place, and time. Mental status is at baseline.     Cranial Nerves: No cranial nerve deficit.  Psychiatric:        Behavior: Behavior normal.        Thought Content: Thought content normal.        Judgment: Judgment normal.     Comments: Very anxious and shy today     EMS EKG:  Rate: ~76 ;  Rhythm: normal sinus rhythm and Cannot exclude anterior infarct, age undetermined with poor R wave progression.  T wave inversion noted in I, aVL and V2.  T wave inversions not seen in V4 and V5, mild nonspecific EKG changes noted.  Subtle J-point elevation in III and aVF;   Narrative Interpretation: Abnormal EKG Cannot exclude  lateral ischemia   Adult ECG Report-today  Rate: 60 ;  Rhythm: normal sinus rhythm and Cannot exclude anterior MI, age undetermined.  T wave inversions noted in V2 through V5 as well as I and aVL.  Mild Q waves with J-point elevation in IIA and aVF (better seen in the rhythm strip) -> cannot exclude inferolateral ischemia.  Otherwise normal axis, intervals and durations.;   Narrative Interpretation: Abnormal EKG.  This would suggest dynamic EKG changes.  Recent Labs: Reviewed Lab Results  Component Value Date   CHOL 119 02/06/2019   HDL 52 02/06/2019   LDLCALC 46 02/06/2019   TRIG 119 02/06/2019   CHOLHDL 2.3 02/06/2019   Lab Results  Component Value Date   CREATININE 0.98 01/04/2020   BUN 7 (L) 01/04/2020   NA 141 01/04/2020   K 3.4 (L) 01/04/2020   CL 107 01/04/2020   CO2 25 01/04/2020   CBC Latest Ref Rng & Units 01/04/2020 01/02/2020 03/20/2019  WBC 4.0 - 10.5 K/uL 7.4 9.3 8.6  Hemoglobin 12.0 - 15.0 g/dL 25.4 27.0 62.3  Hematocrit 36.0 - 46.0 % 39.0 43.6 41.9  Platelets 150 - 400 K/uL 181 218 280    Lab Results  Component Value Date   TSH 1.139 01/02/2020    ASSESSMENT/PLAN    Problem List Items Addressed This Visit    CAD- 100% LAD, s/p SVG-LAD after failed LIMA-LAD (Chronic)    She was doing well up until last week.  Was not having any chest pain, but now she has had several episodes of different types of chest pain-epigastric radiating to around the back as well as parasternal radiating to the shoulders.  Concerning findings that she had elevated troponin, EKG changes that seem dynamic as well as regional wall motion normality on echocardiogram that is new.  Elevate troponins at 1300 is consistent with ACS despite being in [name] RVR.  At this point I do think an ischemic evaluation is warranted and the initial recommendation was cardiac catheterization which is what I feel is the best the most appropriate option.  Plan:  We will schedule for left heart  catheterization and possible PCI on Friday, January 10, 2020  Risks, benefits, alternatives and indications of cath explained.  See below.  Premedicate for contrast hypersensitivity with prednisone and Benadryl  She is on Xarelto which will need to be held after Wednesday's p.m. dose  She did have hypokalemia in the recent hospital stay, will need to increase her potassium supplement to 2 tablets every morning on Tuesday Wednesday and Thursday.  ->  Recheck potassium level likely tomorrow.  Continue beta-blocker at increased dose along with statin.  We will start low-dose Imdur 30 mg daily  starting tonight for additional antianginal benefit.  Cancel scheduled Myoview.      Relevant Medications   isosorbide mononitrate (IMDUR) 30 MG 24 hr tablet   Hyperlipidemia with target LDL less than 70 (Chronic)    Labs look great on current dose of statin.  No change      Relevant Medications   isosorbide mononitrate (IMDUR) 30 MG 24 hr tablet   Paroxysmal atrial fibrillation (HCC); CHA2DS2-VASc Score 3. On Xarelto (Chronic)    Paroxysmal persistent [name] with recent [name]-RVR exacerbation.  She spontaneously converted with diltiazem.  Is difficult to tell because this episode occurred at the same time as her having what sounds like an IBD flare with significant epigastric discomfort.  Interestingly, she denied any chest pain or pressure associated with being in [name] RVR.  Continues to be stable on Xarelto.  Metoprolol dose was increased to 50 mg twice daily.  Elevated troponin with abnormal EKG and echocardiogram in setting of recent [name] RVR.  Plan is to do ischemic evaluation with cardiac catheterization.      Relevant Medications   isosorbide mononitrate (IMDUR) 30 MG 24 hr tablet   Other Relevant Orders   EKG 12-Lead   Comprehensive metabolic panel   ST elevation (STEMI) myocardial infarction involving left anterior descending coronary artery (HCC) (Chronic)    Distant history  of STEMI with VF arrest many many years ago.  She is not truly familiar the symptoms she had.  She is very fearful of cardiac procedure at this point.  Despite having significant MI with VF arrest and then recurrent MI due to acute closure of the LIMA graft, she still has had relatively normal echocardiogram until this most recent 1 which showed reduced EF with regional wall motion normality.  Has been stable up until this past week.      Relevant Medications   isosorbide mononitrate (IMDUR) 30 MG 24 hr tablet   Essential hypertension (Chronic)    Blood pressure stable on current dose metoprolol.  Tolerating well.      Relevant Medications   isosorbide mononitrate (IMDUR) 30 MG 24 hr tablet   Atrial fibrillation with rapid ventricular response (HCC)    Recent admission with [name] RVR-spontaneously converted with diltiazem.  Beta-blocker dose increased to 50 mg twice daily metoprolol.  Remains on Xarelto. Continue increased dose of beta-blocker.  Planning for cardiac catheterization.  Xarelto will be held for cath      Relevant Medications   isosorbide mononitrate (IMDUR) 30 MG 24 hr tablet   Other Relevant Orders   EKG 12-Lead   Comprehensive metabolic panel   Abnormal echocardiogram    Reduced EF with regional wall motion abnormality in the setting of [name] RVR but also with chest discomfort and elevated troponin.  Concerning for recent ACS.  She has now had recurrent chest pain.  Plan is for cardiac catheterization.      Relevant Orders   EKG 12-Lead   Comprehensive metabolic panel   Acute coronary syndrome (HCC) - Primary    She presented with very unusual symptoms of the abdominal epigastric pain as well as chest pain and then also [name] RVR.  However in the setting she had troponin elevation well beyond the level of demand ischemia, dynamic EKG changes and regional wall motion abnormality and echocardiogram.  Recommendation is to cancel Myoview stress test and plan  outpatient catheter the earliest convenience which would be Friday, January 10, 2020.  Start Imdur 30 mg  daily Continue increased dose of metoprolol.      Relevant Medications   isosorbide mononitrate (IMDUR) 30 MG 24 hr tablet   Other Relevant Orders   Comprehensive metabolic panel    Other Visit Diagnoses    Hypokalemia       Relevant Orders   Comprehensive metabolic panel      Performing MD:  Bryan Lemma, M.D., M.S.  Procedure:  LEFT HEART CATHETERIZATION WITH NATIVE CORONARY ARTERY & GRAFT ANGIOGRAPHY with possible PERCUTANEOUS CORONARY INTERVENTION  The procedure with Risks/Benefits/Alternatives and Indications was reviewed with the patient & husband.  All questions were answered.    Risks / Complications include, but not limited to: Death, MI, CVA/TIA, VF/VT (with defibrillation), Bradycardia (need for temporary pacer placement), contrast induced nephropathy, bleeding / bruising / hematoma / pseudoaneurysm, vascular or coronary injury (with possible emergent CT or Vascular Surgery), adverse medication reactions, infection.  Additional risks involving the use of radiation with the possibility of radiation burns and cancer were explained in detail.  Contrast hypersensitivity discussed with premedications ordered.  The patient & husband voice understanding and agree to proceed.      COVID-19 Education: The signs and symptoms of COVID-19 were discussed with the patient and how to seek care for testing (follow up with PCP or arrange E-visit).   The importance of social distancing and COVID-19 vaccination was discussed today.  The patient is practicing social distancing & Masking.   I spent a total of 58 minutes with the patient spent in direct patient consultation.  She was company by her husband today who had 1-1/2 pages of notes were the symptoms to discuss.  We then had to discuss results of tests in the hospital including echocardiogram, EKG and troponin levels.  With plan  for cardiac catheterization, risks, benefits alternatives indications explained in detail along with thought process for proceeding. -> Marylen was having a hard time conceptualizing the plan and therefore required explaining several times in different ways.  This is in addition to husband asking additional questions.  Additional time spent with chart review  / charting (studies, outside notes, etc): 28 min I reviewed the inpatient echocardiogram as well as EKGs inpatient as well as EKG from EMS and here today. I also then reviewed her cardiac catheterization films in preparation for upcoming cath.  Total Time: 86 min   Current medicines are reviewed at length with the patient today.  (+/- concerns) n/a  This visit occurred during the SARS-CoV-2 public health emergency.  Safety protocols were in place, including screening questions prior to the visit, additional usage of staff PPE, and extensive cleaning of exam room while observing appropriate contact time as indicated for disinfecting solutions.  Notice: This dictation was prepared with Dragon dictation along with smaller phrase technology. Any transcriptional errors that result from this process are unintentional and may not be corrected upon review.  Patient Instructions / Medication Changes & Studies & Tests Ordered   Patient Instructions  Medication Instructions:   START TAKING IMDUR 30 MG AT BEDTIME  , 30 MIN AFTER  TAKING TYLENOL   TAKE 2 TABLETOF POTASSIUM Tuesday  AND Wednesday OF THIS WEEK IN THE [facility] *If you need a refill on your cardiac medications before your next appointment, please call your pharmacy*   Lab Work: BMP TOMORROW BEFORE - COVID TEST   If you have labs (blood work) drawn today and your tests are completely normal, you will receive your results only by:  MyChart Message (if you have MyChart)  OR  A paper copy in the mail If you have any lab test that is abnormal or we need to change your treatment, we will  call you to review the results.   Testing/Procedures:  will be 01/10/19 at [facility] [place]- cath Lab-- Your physician has requested that you have a cardiac catheterization. Cardiac catheterization is used to diagnose and/or treat various heart conditions. Doctors may recommend this procedure for a number of different reasons. The most common reason is to evaluate chest pain. Chest pain can be a symptom of coronary artery disease (CAD), and cardiac catheterization can show whether plaque is narrowing or blocking your hearts arteries. This procedure is also used to evaluate the valves, as well as measure the blood flow and oxygen levels in different parts of your heart. For further information please visit [email or web address]. Please follow instruction sheet, as given.   Follow-Up: At [facility], you and your health needs are our priority.  As part of our continuing mission to provide you with exceptional heart care, we have created designated Provider Care Teams.  These Care Teams include your primary Cardiologist (physician) and Advanced Practice Providers (APPs -  Physician Assistants and Nurse Practitioners) who all work together to provide you with the care you need, when you need it.  We recommend signing up for the patient portal called "MyChart".  Sign up information is provided on this After Visit Summary.  MyChart is used to connect with patients for Virtual Visits (Telemedicine).  Patients are able to view lab/test results, encounter notes, upcoming appointments, etc.  Non-urgent messages can be sent to your provider as well.   To learn more about what you can do with MyChart, go to [email or web address].    Your next appointment:     keep your appointment for 01/21/19  The format for your next appointment:   In Person  Provider:   Bryan Lemmaavid Cyree Chuong, MD   Other Instructions You are scheduled for a Cardiac Catheterization on Friday, January 7 with Dr. Bryan Lemmaavid Izyk Marty.   Contrast  Allergy: Yes, Please take Prednisone 50mg  by mouth at: Thirteen hours prior to cath 9: 30 pm on Thursday Seven hours prior to cath 3:30 am on Friday And prior to leaving home please take last dose of Prednisone 50mg  and Benadryl 50mg  by mouth. 8:30 am   Stop taking Xarelto (Rivaroxaban) on Wednesday, January 8. ( the last day to take )    On the morning of your procedure, take your Aspirin 81 mg  and any morning medicines NOT listed above.  You may use sips of water.    Studies Ordered:   Orders Placed This Encounter  Procedures   Comprehensive metabolic panel   EKG 12-Lead     Bryan Lemmaavid Linnell Swords, M.D., M.S. Interventional Cardiologist   Pager # [phone] Phone # [phone] [address] [place], [place] [address]   Thank you for choosing Heartcare at [facility]!!

## 2020-01-06 NOTE — Assessment & Plan Note (Signed)
Distant history of STEMI with VF arrest many many years ago.  She is not truly familiar the symptoms she had.  She is very fearful of cardiac procedure at this point.  Despite having significant MI with VF arrest and then recurrent MI due to acute closure of the LIMA graft, she still has had relatively normal echocardiogram until this most recent 1 which showed reduced EF with regional wall motion normality.  Has been stable up until this past week.

## 2020-01-06 NOTE — Assessment & Plan Note (Signed)
She was doing well up until last week.  Was not having any chest pain, but now she has had several episodes of different types of chest pain-epigastric radiating to around the back as well as parasternal radiating to the shoulders.  Concerning findings that she had elevated troponin, EKG changes that seem dynamic as well as regional wall motion normality on echocardiogram that is new.  Elevate troponins at 1300 is consistent with ACS despite being in [name] RVR.  At this point I do think an ischemic evaluation is warranted and the initial recommendation was cardiac catheterization which is what I feel is the best the most appropriate option.  Plan:  We will schedule for left heart catheterization and possible PCI on Friday, January 10, 2020  Risks, benefits, alternatives and indications of cath explained.  See below.  Premedicate for contrast hypersensitivity with prednisone and Benadryl  She is on Xarelto which will need to be held after Wednesday's p.m. dose  She did have hypokalemia in the recent hospital stay, will need to increase her potassium supplement to 2 tablets every morning on Tuesday Wednesday and Thursday.  ->  Recheck potassium level likely tomorrow.  Continue beta-blocker at increased dose along with statin.  We will start low-dose Imdur 30 mg daily starting tonight for additional antianginal benefit.  Cancel scheduled Myoview.

## 2020-01-06 NOTE — ED Triage Notes (Signed)
Pt was brought by ems with complaints of chest pain. Pt was seen here a few days ago due to chest pain and elevated troponin. Patient was scheduled to go to the cath lab on Friday. Pt has increased chest pain and has taken 3 nitros, one at 6:00,7:30, and 8:40 which relieved the pt of chest pain. Ems reports vitals were stable and their ekg was normal. Pt is also supposed to receive long acting nitroglycerin tonight, patient has not taken it.

## 2020-01-06 NOTE — Assessment & Plan Note (Signed)
Paroxysmal persistent [name] with recent [name]-RVR exacerbation.  She spontaneously converted with diltiazem.  Is difficult to tell because this episode occurred at the same time as her having what sounds like an IBD flare with significant epigastric discomfort.  Interestingly, she denied any chest pain or pressure associated with being in [name] RVR.  Continues to be stable on Xarelto.  Metoprolol dose was increased to 50 mg twice daily.  Elevated troponin with abnormal EKG and echocardiogram in setting of recent [name] RVR.  Plan is to do ischemic evaluation with cardiac catheterization.

## 2020-01-06 NOTE — Patient Instructions (Addendum)
Medication Instructions:   START TAKING IMDUR 30 MG AT BEDTIME  , 30 MIN AFTER  TAKING TYLENOL   TAKE 2 TABLETOF POTASSIUM Tuesday  AND Wednesday OF THIS WEEK IN THE [facility] *If you need a refill on your cardiac medications before your next appointment, please call your pharmacy*   Lab Work: BMP TOMORROW BEFORE - COVID TEST   If you have labs (blood work) drawn today and your tests are completely normal, you will receive your results only by: Marland Kitchen MyChart Message (if you have MyChart) OR . A paper copy in the mail If you have any lab test that is abnormal or we need to change your treatment, we will call you to review the results.   Testing/Procedures:  will be 01/10/19 at [facility]- cath Lab-- Your physician has requested that you have a cardiac catheterization. Cardiac catheterization is used to diagnose and/or treat various heart conditions. Doctors may recommend this procedure for a number of different reasons. The most common reason is to evaluate chest pain. Chest pain can be a symptom of coronary artery disease (CAD), and cardiac catheterization can show whether plaque is narrowing or blocking your heart's arteries. This procedure is also used to evaluate the valves, as well as measure the blood flow and oxygen levels in different parts of your heart. For further information please visit [email or web address] Please follow instruction sheet, as given.   Follow-Up: At [facility], you and your health needs are our priority.  As part of our continuing mission to provide you with exceptional heart care, we have created designated Provider Care Teams.  These Care Teams include your primary Cardiologist (physician) and Advanced Practice Providers (APPs -  Physician Assistants and Nurse Practitioners) who all work together to provide you with the care you need, when you need it.  We recommend signing up for the patient portal called "MyChart".  Sign up information is provided on this  After Visit Summary.  MyChart is used to connect with patients for Virtual Visits (Telemedicine).  Patients are able to view lab/test results, encounter notes, upcoming appointments, etc.  Non-urgent messages can be sent to your provider as well.   To learn more about what you can do with MyChart, go to [email or web address].    Your next appointment:     keep your appointment for 01/21/19  The format for your next appointment:   In Person  Provider:   Bryan Lemma, MD   Other Instructions      [facility]ARDIOVASCULAR DIV[facility] [address] [place] [address] [place] [address] Dept: [phone] Loc: [phone]  TENIKA KEERAN  01/06/2020  You are scheduled for a Cardiac Catheterization on Friday, January 7 with Dr. Bryan Lemma.  1. Please arrive at the [facility] (Main Entrance A) at [facility]: [address] [place], [place] [address] at 8:30 AM (This time is two hours before your procedure to ensure your preparation). Free valet parking service is available.   Special note: Every effort is made to have your procedure done on time. Please understand that emergencies sometimes delay scheduled procedures.  2. Diet: Do not eat solid foods after midnight.  The patient may have clear liquids until 5am upon the day of the procedure.  3. Labs: You will need to have blood drawn on CMP, January 4 at [facility] [address] [place]  Open: 8am - 5pm (Lunch 12:30 - 1:30)   Phone: [phone]. You do  not need to be fasting.  You will need a COVID-19  test prior to your procedure. You are scheduled for  Jan 07, 2020 at 1:20 PM. This is a Drive Up Visit at [id number] [place], [place] [address]. Someone will direct you to the appropriate testing line. Stay in your car and someone will be with you shortly.  4. Medication instructions in preparation for your procedure:   Contrast Allergy: Yes,  Please take Prednisone 50mg  by mouth at: Thirteen hours prior to cath 9: 30 pm on Thursday Seven hours prior to cath 3:30 am on Friday And prior to leaving home please take last dose of Prednisone 50mg  and Benadryl 50mg  by mouth. 8:30 am   Stop taking Xarelto (Rivaroxaban) on Wednesday, January 8. ( the last day to take )    On the morning of your procedure, take your Aspirin 81 mg  and any morning medicines NOT listed above.  You may use sips of water.  5. Plan for one night stay--bring personal belongings. 6. Bring a current list of your medications and current insurance cards. 7. You MUST have a responsible person to drive you home. 8. Someone MUST be with you the first 24 hours after you arrive home or your discharge will be delayed. 9. Please wear clothes that are easy to get on and off and wear slip-on shoes.  Thank you for allowing [place] to care for you!   -- [facility]

## 2020-01-06 NOTE — Assessment & Plan Note (Signed)
Labs look great on current dose of statin.  No change

## 2020-01-06 NOTE — Assessment & Plan Note (Signed)
She presented with very unusual symptoms of the abdominal epigastric pain as well as chest pain and then also [name] RVR.  However in the setting she had troponin elevation well beyond the level of demand ischemia, dynamic EKG changes and regional wall motion abnormality and echocardiogram.  Recommendation is to cancel Myoview stress test and plan outpatient catheter the earliest convenience which would be Friday, January 10, 2020.  Start Imdur 30 mg daily Continue increased dose of metoprolol.

## 2020-01-07 ENCOUNTER — Ambulatory Visit (HOSPITAL_COMMUNITY): Admit: 2020-01-07 | Payer: Medicare Other | Admitting: Cardiology

## 2020-01-07 ENCOUNTER — Other Ambulatory Visit (HOSPITAL_COMMUNITY): Payer: Medicare Other

## 2020-01-07 DIAGNOSIS — E785 Hyperlipidemia, unspecified: Secondary | ICD-10-CM | POA: Diagnosis not present

## 2020-01-07 DIAGNOSIS — Z91041 Radiographic dye allergy status: Secondary | ICD-10-CM | POA: Diagnosis not present

## 2020-01-07 DIAGNOSIS — Z79890 Hormone replacement therapy (postmenopausal): Secondary | ICD-10-CM | POA: Diagnosis not present

## 2020-01-07 DIAGNOSIS — Z7901 Long term (current) use of anticoagulants: Secondary | ICD-10-CM | POA: Diagnosis not present

## 2020-01-07 DIAGNOSIS — I25700 Atherosclerosis of coronary artery bypass graft(s), unspecified, with unstable angina pectoris: Secondary | ICD-10-CM | POA: Diagnosis not present

## 2020-01-07 DIAGNOSIS — I214 Non-ST elevation (NSTEMI) myocardial infarction: Secondary | ICD-10-CM | POA: Diagnosis not present

## 2020-01-07 DIAGNOSIS — I200 Unstable angina: Secondary | ICD-10-CM | POA: Diagnosis not present

## 2020-01-07 DIAGNOSIS — I252 Old myocardial infarction: Secondary | ICD-10-CM | POA: Diagnosis not present

## 2020-01-07 DIAGNOSIS — Z8674 Personal history of sudden cardiac arrest: Secondary | ICD-10-CM | POA: Diagnosis not present

## 2020-01-07 DIAGNOSIS — K219 Gastro-esophageal reflux disease without esophagitis: Secondary | ICD-10-CM | POA: Diagnosis not present

## 2020-01-07 DIAGNOSIS — E876 Hypokalemia: Secondary | ICD-10-CM | POA: Diagnosis not present

## 2020-01-07 DIAGNOSIS — Z79899 Other long term (current) drug therapy: Secondary | ICD-10-CM | POA: Diagnosis not present

## 2020-01-07 DIAGNOSIS — I25810 Atherosclerosis of coronary artery bypass graft(s) without angina pectoris: Secondary | ICD-10-CM | POA: Diagnosis not present

## 2020-01-07 DIAGNOSIS — Z20822 Contact with and (suspected) exposure to covid-19: Secondary | ICD-10-CM | POA: Diagnosis not present

## 2020-01-07 DIAGNOSIS — K589 Irritable bowel syndrome without diarrhea: Secondary | ICD-10-CM | POA: Diagnosis not present

## 2020-01-07 DIAGNOSIS — I25110 Atherosclerotic heart disease of native coronary artery with unstable angina pectoris: Secondary | ICD-10-CM | POA: Diagnosis not present

## 2020-01-07 DIAGNOSIS — J449 Chronic obstructive pulmonary disease, unspecified: Secondary | ICD-10-CM | POA: Diagnosis not present

## 2020-01-07 DIAGNOSIS — F17210 Nicotine dependence, cigarettes, uncomplicated: Secondary | ICD-10-CM | POA: Diagnosis present

## 2020-01-07 DIAGNOSIS — I25710 Atherosclerosis of autologous vein coronary artery bypass graft(s) with unstable angina pectoris: Secondary | ICD-10-CM | POA: Diagnosis not present

## 2020-01-07 DIAGNOSIS — R079 Chest pain, unspecified: Secondary | ICD-10-CM | POA: Diagnosis not present

## 2020-01-07 DIAGNOSIS — I429 Cardiomyopathy, unspecified: Secondary | ICD-10-CM | POA: Diagnosis not present

## 2020-01-07 DIAGNOSIS — I10 Essential (primary) hypertension: Secondary | ICD-10-CM | POA: Diagnosis not present

## 2020-01-07 DIAGNOSIS — I509 Heart failure, unspecified: Secondary | ICD-10-CM | POA: Diagnosis not present

## 2020-01-07 DIAGNOSIS — Z955 Presence of coronary angioplasty implant and graft: Secondary | ICD-10-CM | POA: Diagnosis not present

## 2020-01-07 DIAGNOSIS — Z888 Allergy status to other drugs, medicaments and biological substances status: Secondary | ICD-10-CM | POA: Diagnosis not present

## 2020-01-07 DIAGNOSIS — I480 Paroxysmal atrial fibrillation: Secondary | ICD-10-CM | POA: Diagnosis not present

## 2020-01-07 DIAGNOSIS — E039 Hypothyroidism, unspecified: Secondary | ICD-10-CM | POA: Diagnosis not present

## 2020-01-07 LAB — CBC WITH DIFFERENTIAL/PLATELET
Abs Immature Granulocytes: 0.03 10*3/uL (ref 0.00–0.07)
Basophils Absolute: 0.1 10*3/uL (ref 0.0–0.1)
Basophils Relative: 1 %
Eosinophils Absolute: 0.2 10*3/uL (ref 0.0–0.5)
Eosinophils Relative: 2 %
HCT: 39.5 % (ref 36.0–46.0)
Hemoglobin: 13.2 g/dL (ref 12.0–15.0)
Immature Granulocytes: 0 %
Lymphocytes Relative: 35 %
Lymphs Abs: 2.5 10*3/uL (ref 0.7–4.0)
MCH: 31.7 pg (ref 26.0–34.0)
MCHC: 33.4 g/dL (ref 30.0–36.0)
MCV: 95 fL (ref 80.0–100.0)
Monocytes Absolute: 0.5 10*3/uL (ref 0.1–1.0)
Monocytes Relative: 7 %
Neutro Abs: 3.8 10*3/uL (ref 1.7–7.7)
Neutrophils Relative %: 55 %
Platelets: 190 10*3/uL (ref 150–400)
RBC: 4.16 MIL/uL (ref 3.87–5.11)
RDW: 12.8 % (ref 11.5–15.5)
WBC: 7 10*3/uL (ref 4.0–10.5)
nRBC: 0 % (ref 0.0–0.2)

## 2020-01-07 LAB — PROTIME-INR
INR: 2.7 — ABNORMAL HIGH (ref 0.8–1.2)
Prothrombin Time: 27.9 seconds — ABNORMAL HIGH (ref 11.4–15.2)

## 2020-01-07 LAB — COMPREHENSIVE METABOLIC PANEL
ALT: 31 U/L (ref 0–44)
AST: 26 U/L (ref 15–41)
Albumin: 3.7 g/dL (ref 3.5–5.0)
Alkaline Phosphatase: 62 U/L (ref 38–126)
Anion gap: 7 (ref 5–15)
BUN: <5 mg/dL — ABNORMAL LOW (ref 8–23)
CO2: 25 mmol/L (ref 22–32)
Calcium: 9.2 mg/dL (ref 8.9–10.3)
Chloride: 105 mmol/L (ref 98–111)
Creatinine, Ser: 0.88 mg/dL (ref 0.44–1.00)
GFR, Estimated: >60 mL/min (ref >60)
Glucose, Bld: 106 mg/dL — ABNORMAL HIGH (ref 70–99)
Potassium: 3.5 mmol/L (ref 3.5–5.1)
Sodium: 137 mmol/L (ref 135–145)
Total Bilirubin: 0.7 mg/dL (ref 0.3–1.2)
Total Protein: 6.8 g/dL (ref 6.5–8.1)

## 2020-01-07 LAB — CBC
HCT: 37.5 % (ref 36.0–46.0)
Hemoglobin: 12.5 g/dL (ref 12.0–15.0)
MCH: 31.4 pg (ref 26.0–34.0)
MCHC: 33.3 g/dL (ref 30.0–36.0)
MCV: 94.2 fL (ref 80.0–100.0)
Platelets: 175 10*3/uL (ref 150–400)
RBC: 3.98 MIL/uL (ref 3.87–5.11)
RDW: 12.8 % (ref 11.5–15.5)
WBC: 6 10*3/uL (ref 4.0–10.5)
nRBC: 0 % (ref 0.0–0.2)

## 2020-01-07 LAB — BASIC METABOLIC PANEL
Anion gap: 8 (ref 5–15)
BUN: <5 mg/dL — ABNORMAL LOW (ref 8–23)
CO2: 24 mmol/L (ref 22–32)
Calcium: 9 mg/dL (ref 8.9–10.3)
Chloride: 110 mmol/L (ref 98–111)
Creatinine, Ser: 0.83 mg/dL (ref 0.44–1.00)
GFR, Estimated: >60 mL/min (ref >60)
Glucose, Bld: 89 mg/dL (ref 70–99)
Potassium: 3.6 mmol/L (ref 3.5–5.1)
Sodium: 142 mmol/L (ref 135–145)

## 2020-01-07 LAB — RESP PANEL BY RT-PCR (FLU A&B, COVID) ARPGX2
Influenza A by PCR: NEGATIVE
Influenza B by PCR: NEGATIVE
SARS Coronavirus 2 by RT PCR: NEGATIVE

## 2020-01-07 LAB — TROPONIN I (HIGH SENSITIVITY)
Troponin I (High Sensitivity): 125 ng/L (ref <18)
Troponin I (High Sensitivity): 134 ng/L (ref <18)
Troponin I (High Sensitivity): 141 ng/L (ref <18)
Troponin I (High Sensitivity): 111 ng/L (ref <18)

## 2020-01-07 LAB — LIPID PANEL
Cholesterol: 96 mg/dL (ref 0–200)
HDL: 43 mg/dL (ref >40)
LDL Cholesterol: 36 mg/dL (ref 0–99)
Total CHOL/HDL Ratio: 2.2 RATIO
Triglycerides: 86 mg/dL (ref <150)
VLDL: 17 mg/dL (ref 0–40)

## 2020-01-07 LAB — HEPARIN LEVEL (UNFRACTIONATED): Heparin Unfractionated: 0.95 IU/mL — ABNORMAL HIGH (ref 0.30–0.70)

## 2020-01-07 LAB — APTT: aPTT: 37 seconds — ABNORMAL HIGH (ref 24–36)

## 2020-01-07 LAB — LIPASE, BLOOD: Lipase: 22 U/L (ref 11–51)

## 2020-01-07 MED ORDER — HEPARIN (PORCINE) 25000 UT/250ML-% IV SOLN: 700.0000 [IU]/h | INTRAVENOUS | Status: DC

## 2020-01-07 MED ORDER — HYDROCORTISONE NA SUCCINATE PF 100 MG IJ SOLR
200.0000 mg | Freq: Once | INTRAMUSCULAR | Status: AC
  Administered 2020-01-08: 200 mg via INTRAVENOUS
  Filled 2020-01-07: qty 4

## 2020-01-07 MED ORDER — DIPHENHYDRAMINE HCL 50 MG/ML IJ SOLN: 50.0000 mg | Freq: Once | INTRAMUSCULAR | Status: AC

## 2020-01-07 MED ORDER — HYOSCYAMINE SULFATE 0.125 MG SL SUBL
0.1250 mg | SUBLINGUAL_TABLET | Freq: Two times a day (BID) | SUBLINGUAL | Status: DC
  Administered 2020-01-07 – 2020-01-09 (×5): 0.125 mg via ORAL
  Filled 2020-01-07 (×6): qty 1

## 2020-01-07 MED ORDER — NICOTINE 14 MG/24HR TD PT24
14.0000 mg | MEDICATED_PATCH | Freq: Every day | TRANSDERMAL | Status: DC
  Administered 2020-01-07 – 2020-01-09 (×3): 14 mg via TRANSDERMAL
  Filled 2020-01-07 (×3): qty 1

## 2020-01-07 MED ORDER — DIPHENHYDRAMINE HCL 25 MG PO CAPS
50.0000 mg | ORAL_CAPSULE | Freq: Once | ORAL | Status: AC
  Administered 2020-01-07: 50 mg via ORAL
  Filled 2020-01-07: qty 2

## 2020-01-07 MED ORDER — LEVOTHYROXINE SODIUM 75 MCG PO TABS
75.0000 ug | ORAL_TABLET | Freq: Every day | ORAL | Status: DC
  Administered 2020-01-07 – 2020-01-09 (×3): 75 ug via ORAL
  Filled 2020-01-07 (×3): qty 1

## 2020-01-07 MED ORDER — ASPIRIN 300 MG RE SUPP: 300.0000 mg | RECTAL | Status: AC

## 2020-01-07 MED ORDER — SODIUM CHLORIDE 0.9 % IV SOLN: INTRAVENOUS | Status: AC

## 2020-01-07 MED ORDER — ASPIRIN 81 MG PO CHEW
324.0000 mg | CHEWABLE_TABLET | ORAL | Status: AC
  Administered 2020-01-07: 324 mg via ORAL
  Filled 2020-01-07 (×2): qty 4

## 2020-01-07 MED ORDER — NITROGLYCERIN 0.4 MG SL SUBL: 0.4000 mg | SUBLINGUAL_TABLET | SUBLINGUAL | Status: DC | PRN

## 2020-01-07 MED ORDER — ASPIRIN EC 81 MG PO TBEC: 81.0000 mg | DELAYED_RELEASE_TABLET | Freq: Every day | ORAL | Status: DC

## 2020-01-07 MED ORDER — METOPROLOL SUCCINATE ER 50 MG PO TB24
50.0000 mg | ORAL_TABLET | Freq: Two times a day (BID) | ORAL | Status: DC
  Administered 2020-01-07 – 2020-01-09 (×4): 50 mg via ORAL
  Filled 2020-01-07 (×5): qty 1

## 2020-01-07 MED ORDER — ACETAMINOPHEN 325 MG PO TABS
650.0000 mg | ORAL_TABLET | ORAL | Status: DC | PRN
  Administered 2020-01-07 – 2020-01-08 (×3): 650 mg via ORAL
  Filled 2020-01-07 (×3): qty 2

## 2020-01-07 MED ORDER — SIMETHICONE 80 MG PO CHEW
120.0000 mg | CHEWABLE_TABLET | Freq: Three times a day (TID) | ORAL | Status: DC | PRN
  Administered 2020-01-07: 18:00:00 120 mg via ORAL
  Filled 2020-01-07: qty 2

## 2020-01-07 MED ORDER — HEPARIN (PORCINE) 25000 UT/250ML-% IV SOLN
900.0000 [IU]/h | INTRAVENOUS | Status: DC
  Administered 2020-01-07: 700 [IU]/h via INTRAVENOUS
  Filled 2020-01-07: qty 250

## 2020-01-07 MED ORDER — HYDROCORTISONE NA SUCCINATE PF 250 MG IJ SOLR
200.0000 mg | Freq: Once | INTRAMUSCULAR | Status: DC
  Filled 2020-01-07: qty 200

## 2020-01-07 MED ORDER — HYDROCORTISONE NA SUCCINATE PF 100 MG IJ SOLR
200.0000 mg | Freq: Once | INTRAMUSCULAR | Status: AC
  Administered 2020-01-07: 200 mg via INTRAVENOUS
  Filled 2020-01-07 (×2): qty 4

## 2020-01-07 MED ORDER — ISOSORBIDE MONONITRATE ER 30 MG PO TB24
30.0000 mg | ORAL_TABLET | Freq: Every day | ORAL | Status: DC
  Administered 2020-01-07 – 2020-01-09 (×3): 30 mg via ORAL
  Filled 2020-01-07 (×3): qty 1

## 2020-01-07 MED ORDER — DIPHENHYDRAMINE HCL 25 MG PO CAPS
50.0000 mg | ORAL_CAPSULE | Freq: Once | ORAL | Status: AC
  Administered 2020-01-08: 50 mg via ORAL
  Filled 2020-01-07: qty 2

## 2020-01-07 MED ORDER — ISOSORBIDE MONONITRATE ER 30 MG PO TB24: 30.0000 mg | ORAL_TABLET | Freq: Every day | ORAL | Status: DC

## 2020-01-07 MED ORDER — ONDANSETRON HCL 4 MG/2ML IJ SOLN: 4.0000 mg | Freq: Four times a day (QID) | INTRAMUSCULAR | Status: DC | PRN

## 2020-01-07 MED ORDER — ALUM & MAG HYDROXIDE-SIMETH 200-200-20 MG/5ML PO SUSP: 15.0000 mL | Freq: Four times a day (QID) | ORAL | Status: DC | PRN

## 2020-01-07 MED ORDER — POTASSIUM CHLORIDE CRYS ER 20 MEQ PO TBCR
40.0000 meq | EXTENDED_RELEASE_TABLET | Freq: Once | ORAL | Status: AC
  Administered 2020-01-07: 40 meq via ORAL
  Filled 2020-01-07: qty 2

## 2020-01-07 MED ORDER — ASPIRIN 81 MG PO CHEW
81.0000 mg | CHEWABLE_TABLET | ORAL | Status: AC
  Administered 2020-01-08: 81 mg via ORAL
  Filled 2020-01-07: qty 1

## 2020-01-07 MED ORDER — NITROGLYCERIN 2 % TD OINT
1.0000 [in_us] | TOPICAL_OINTMENT | Freq: Four times a day (QID) | TRANSDERMAL | Status: DC
  Filled 2020-01-07: qty 30

## 2020-01-07 MED ORDER — ROSUVASTATIN CALCIUM 20 MG PO TABS
20.0000 mg | ORAL_TABLET | Freq: Every day | ORAL | Status: DC
  Administered 2020-01-07 – 2020-01-09 (×3): 20 mg via ORAL
  Filled 2020-01-07 (×3): qty 1

## 2020-01-07 NOTE — Discharge Instructions (Signed)

## 2020-01-07 NOTE — ED Notes (Signed)
md notified of pts troponin of 125

## 2020-01-07 NOTE — H&P (View-Only) (Signed)
Progress Note  Patient Name: Dominique Barber Date of Encounter: 01/07/2020  [facility] Cardiologist: Bryan Lemma, MD   Subjective   Mild chest discomfort.  No shortness of breath or palpitation.  Inpatient Medications    Scheduled Meds: . [START ON 01/08/2020] aspirin EC  81 mg Oral Daily  . hyoscyamine  0.125 mg Oral BID  . isosorbide mononitrate  30 mg Oral QHS  . levothyroxine  75 mcg Oral QAC breakfast  . metoprolol succinate  50 mg Oral BID  . nicotine  14 mg Transdermal Daily  . rosuvastatin  20 mg Oral Daily   Continuous Infusions: . sodium chloride 100 mL/hr at 01/07/20 0359  . heparin     PRN Meds: acetaminophen, alum & mag hydroxide-simeth, nitroGLYCERIN, nitroGLYCERIN, ondansetron (ZOFRAN) IV, simethicone   Vital Signs    Vitals:   01/07/20 0245 01/07/20 0345 01/07/20 0411 01/07/20 0432  BP: (!) 155/69 (!) 153/80  138/81  Pulse: (!) 54 (!) 58  (!) 53  Resp: 16 18  18   Temp:   98.2 F (36.8 C) 98.4 F (36.9 C)  TempSrc:   Oral Oral  SpO2: 97% 96%  96%  Weight:      Height:        Intake/Output Summary (Last 24 hours) at 01/07/2020 0722 Last data filed at 01/07/2020 0359 Gross per 24 hour  Intake 100 ml  Output -  Net 100 ml   Last 3 Weights 01/06/2020 01/06/2020 01/03/2020  Weight (lbs) 127 lb 127 lb 123 lb 9.6 oz  Weight (kg) 57.607 kg 57.607 kg 56.065 kg      Telemetry    Sinus bradycardia in 50s- Personally Reviewed  ECG    Sinus rhythm with anterior lateral T wave inversion which is improved compared to few days ago- Personally Reviewed  Physical Exam   GEN: No acute distress.   Neck: No JVD Cardiac: RRR, no murmurs, rubs, or gallops.  Respiratory: Clear to auscultation bilaterally. GI: Soft, nontender, non-distended  MS: No edema; No deformity. Neuro:  Nonfocal  Psych: Normal affect   Labs    High Sensitivity Troponin:   Recent Labs  Lab 01/02/20 1001 01/02/20 1514 01/04/20 2323 01/06/20 2350 01/07/20 0044   TROPONINIHS 649* 1,309* 434* 125* 134*      Chemistry Recent Labs  Lab 01/02/20 0617 01/04/20 2323 01/06/20 2350  NA 137 141 137  K 3.7 3.4* 3.5  CL 107 107 105  CO2 17* 25 25  GLUCOSE 82 134* 106*  BUN 15 7* <5*  CREATININE 0.94 0.98 0.88  CALCIUM 8.8* 9.0 9.2  PROT 6.6 6.1* 6.8  ALBUMIN 3.7 3.4* 3.7  AST 29 24 26   ALT 28 26 31   ALKPHOS 64 61 62  BILITOT 0.9 0.4 0.7  GFRNONAA >60 >60 >60  ANIONGAP 13 9 7      Hematology Recent Labs  Lab 01/02/20 0617 01/04/20 2323 01/06/20 2350  WBC 9.3 7.4 7.0  RBC 4.65 4.10 4.16  HGB 14.5 13.0 13.2  HCT 43.6 39.0 39.5  MCV 93.8 95.1 95.0  MCH 31.2 31.7 31.7  MCHC 33.3 33.3 33.4  RDW 12.6 12.8 12.8  PLT 218 181 190    Radiology    DG Chest Port 1 View  Result Date: 01/06/2020 CLINICAL DATA:  Chest pain EXAM: PORTABLE CHEST 1 VIEW COMPARISON:  01/02/2020 FINDINGS: The lungs are symmetrically mildly hyperinflated. Stable right apical cyst. No pneumothorax or pleural effusion. Coronary artery bypass grafting has been performed. Cardiac size within  normal limits. Pulmonary vascularity is normal. No acute bone abnormality. IMPRESSION: No active disease.  COPD. Electronically Signed   By: Fidela Salisbury MD   On: 01/06/2020 23:12    Cardiac Studies    Echo 01/02/2020 1. Left ventricular ejection fraction, by estimation, is 45 to 50%. The  left ventricle has mildly decreased function. The left ventricle  demonstrates regional wall motion abnormalities (see scoring  diagram/findings for description). Left ventricular  diastolic parameters are consistent with Grade II diastolic dysfunction  (pseudonormalization).  2. Right ventricular systolic function is normal. The right ventricular  size is normal.  3. The mitral valve is normal in structure. Trivial mitral valve  regurgitation. No evidence of mitral stenosis.  4. The aortic valve is normal in structure. Aortic valve regurgitation is  not visualized. No aortic stenosis  is present.  5. The inferior vena cava is normal in size with greater than 50%  respiratory variability, suggesting right atrial pressure of 3 mmHg.   Patient Profile     65 y.o. female with hx of CAD s/p STEMI 1998w/ VF arrest & anoxic brain injury w/ status epilepticus, s/p blow-by w/ BMS LAD 1 mo later. ISR>>CABG 1998 w/ LIMA-LAD & SVG-D1,AMI w/emergent re-operation w/ SVG-LAD 1998, PAF, COPD, GERD, IBS, HTN, HLDwho is scheduled for outpatient cardiac catheterization 01/10/2020 however presented with recurrent angina.   Admitted with [name] RVR 12/30-12/31 -> she had upper abdominal pain that resolved with conversion to sinus rhythm.  She had troponin elevation of 1309, reduced EF on echocardiogram with regional wall motion abnormality and dynamic ST and T wave changes with T wave inversions on EKG. Plan was for outpatient [facility] as she was very adamant about leaving. Patient declined cardiac catheterization.   Had episode of chest pain January 1 and 2.  Relieved with nitroglycerin requiring to call EMS but declined further evaluation.  Seen by Dr. Ellyn Hack January 3 and felt symptoms due to unstable angina and recommended cardiac cath.  Assessment & Plan    1.  Unstable angina/non-STEMI with history of CAD - Recurrent episode of chest pressure yesterday relieved after sublingual nitroglycerin x3.  - Hs troponin 125>>134 (was peaked to 1309 on 12/30).  - EKG with improved T wave changes from few days ago - Currently mild 1-2/10 chest discomfort.  - Start Nitro paste. If recurrent episode >> will start nitro gtt - Her last dose of Xarelto was 01/06/2019 late evening>> Continue IV heparin>> cath tomorrow - Cycle troponin  - Continue ASA, statin and BB  2. PAF - Maintaining sinus rhythm  - Hold Xarelto for cath , on heparin - Continue BB (will not up-tirate due to bradycardia in 50s)  3. HLD - 02/06/2019: Cholesterol, Total 119; HDL 52; LDL Chol Calc (NIH) 46; Triglycerides  119  - Up-titrate Crestor based on cath  - Will repeat lab  4. Cardiomyopathy - Recent echo 12/30 showed LVEF of 45-50% regional wall motion abnormality. Grade 2 diastolic dysfunction. -Continue Toprol-XL -Consider adding ACE or ARB/spironolactone post cardiac catheterization  5. HTN - Continue BB  For questions or updates, please contact [place] Please consult [email or web address] for contact info under        Signed, Leanor Kail, PA  01/07/2020, 7:22 AM    Agree with note by Vin Bhagat PA-C  No further chest pain.  Xarelto on hold.  We will start IV heparin.  Enzymes low and flat.  EKG shows no acute changes.  Initially she was scheduled for diagnostic  coronary angiogram today which was put off until tomorrow because of the proximity to her last Xarelto dose.  Her exam is benign.  We will also start low-dose long-acting oral nitrates.  Lorretta Harp, M.D., [place], Korayma Hagwood M. [facility], [place] [address] [place], [place]  [address]  [phone] 01/07/2020 10:28 AM

## 2020-01-07 NOTE — H&P (Signed)
Cardiology Admission History and Physical:   Patient ID: Dominique Barber MRN: [id number]; DOB: 11-23-1955   Admission date: 01/06/2020  Primary Care Provider: Nicolette Bang, DO Primary Cardiologist: Glenetta Hew, MD  Primary Electrophysiologist:  None   Chief Complaint:  Chest pain  Patient Profile:   Dominique Barber is a 65 y.o. Barber with history of atrial fibrillation, prior CABG after STEMI in [id number] complicated by VF arrest and status epilepticus, subsequent AMI w/ re-do SVG--> LAD in 1998, COPD, GERD who presents for evalaution of chest pain.  History of Present Illness:   Dominique Barber is a 65 year old Barber with risk factors as above who presented this evening to the emergency department for evaluation of chest pain.  She was seen just today for follow-up of a recent hospitalization during which she was in AF w/ RVR and experienced acute coronary syndrome with elevated troponin, dynamic ECG changes, and reduced LVEF.  She presented previously in late December with abdominal pain in the context of AF w/ RVR.  It resolved with conversion to normal sinus rhythm.  Troponin was elevated to 1309 and TTE showed depressed LVEF.  She's since contacted EMS thrice for ongoing chest pain (01/04/20, 01/05/20, and again today).  Following evaluation with Dr. Ellyn Hack earlier today she had been scheduled for an outpatient angiogram on Friday but I discussed with her that given her multiple ED presentations, ongoing positive troponin with persistent symtpoms, it's probably best that she come in for angiogram now and we will apprise Dr. Ellyn Hack if he would like to do her case.  Last took her xarelto this evening.   Past Cardiac History: ? Anterior STEMI 57 (age 27):She had EF arrest with cardiac shock and mild anoxic brain injury (with status epilepticus). -->Emergent PTCA to the LAD ? She then had BMS stent placed in February 1998 ? Shortly thereafter she underwent CABG with the  LIMA-LAD and SVG-D2. - For in-stent restenosis  Postop complication -- acute occlusion of the LIMA graft ->recurrent anterior MI ->redo CABG with SVG-mLAD.   Most recent Cath Jan 2005: Patent SVG-D1 &SVG-mLAD- (no significant change in disease). Large bifurcating D1 branch that is filled via retrograde flow from the LAD. Dominant circumflex with small OM1 large bifurcating OM 2 as well as a large OM 3 before terminating in a bifurcation into LPL and large PDA. The LIMA LAD was atretic/occluded. ->EF roughly 50% with inferoapical hypokinesis.  Myoview June 2016 - Small mid anterior wall scar without ischemia. Low risk study  Afib - April 2016 - now on Xarelto. By her report, since her CABG she has been troubled with IBS  Heart Pathway Score:     Past Medical History:  Diagnosis Date  . CAD in native artery 1998   Followup 1 month post MI revealed 95% ISR in PTCA site --> PCI with 3.0 mm x 25 mm BMS stent in proximal LAD --> 5 months later recurrent ISR of LAD BMS that compromised D1 --> referred for CABG .--> Emergent redo single vessel CABG with SVG-mLAD for occluded LIMA  . COPD (chronic obstructive pulmonary disease) ([place])   . Elevated troponin 01/03/2020  . GERD (gastroesophageal reflux disease)   . Hyperlipidemia   . Hypothyroidism    On Synthroid  . Iron deficiency anemia   . Irritable bowel syndrome    Followed by Dr. Juanita Craver.  . LV dysfunction 01/03/2020  . PAF (paroxysmal atrial fibrillation) ([place]) 05/03/2014   New onset - originally with RVR  .  S/P CABG x 2 07/1996   Initially LIMA-LAD, SVG-D1 --> immediate LIMA failure post CABG with anterior MI --> urgent redo SVG-LAD beyond D2.; Widely patent grafts as of 2005 with left dominant system. Graft to the diagonal branch has a very small target vessel but the vein graft to LAD retrograde fills a large bifurcating D2 and antegrade fills a large D3.  . ST elevation (STEMI) myocardial infarction involving left anterior  descending coronary artery (H[facility]) 01/19/1996   Complicated by VF arrest, and anoxic brain injury with status epilepticus; POBA of LAD --> 6 months later, after 2 vessel CABG she had postop complication with occluded LIMA/second anterior STEMI  . Tobacco abuse     Past Surgical History:  Procedure Laterality Date  . APPENDECTOMY    . BREAST BIOPSY Right   . CARDIAC CATHETERIZATION  07/30/1996   normal L main, LAD w/95% stenosis just before stent, diffuse 80-85% end-stent restenosis, 1st diagonal with70-75% ostial stenosis, large optional diagonal that was normal, Cfx with 2 marginals and distal PLA (all normal), RCA normal (Dr. Jonette Eva)  . CARDIAC CATHETERIZATION  01/31/2003   LAD totally occluded in prox 3rd, patent Cfx, SVG to mid LAd patent, SVG to small DX1 patent, LIMA to LAD was atretic and essentially occluded in junction of prox & mid 3rd, R barciocephalic and subclavian were normal (Dr. Jonette Eva)  . CESAREAN SECTION    . CHOLECYSTECTOMY    . CORONARY ANGIOPLASTY  01/19/1996   acute anterior wall MI, anoxic encephalopahty, VF; LCfx free of disease and dominant, RCA patent, L main short and patent, LAD with 70-80% narrowing and focal 95% stenosis in distal 3rd with balloon angioplasty (Dr. Jonette Eva)  . CORONARY ANGIOPLASTY WITH STENT PLACEMENT  02/26/1996   3.0x80mm Multi-Link stent to prox/mid LAD; L main normal & short, Cfx dominant and normal, PDA & PLA normal, RCA non-dominant with small PLA and normal (Dr. Jonette Eva)  . CORONARY ARTERY BYPASS GRAFT  07/31/1996   x2; LIMA to LAD and SVG to 1st diagonal (Dr. Molinda Bailiff)  . CORONARY ARTERY BYPASS GRAFT  07/31/1996   x1; SVG to distal LAD (Dr. Molinda Bailiff)  . NM MYOCAR PERF WALL MOTION  10/2010; 06/2014   a) LexiScan Cardiolite - mild perfusion defect r/t infarct/scar with mild periifarct ischemia in mid anterior & apical anterior region; EF 67%; abnormal, low risk study;; b) Small, moderate intensity Fixed perfusion defect - mid  Anterior Wall c/w known Anterior MI. LOW RISK   . OVARIAN CYST REMOVAL    . TRANSTHORACIC ECHOCARDIOGRAM  02/2012; 06/2014   a) EF 50-55%, mild HK of anterospetal myocardium; mild MR;; b) EF 55-60%, mild HK of apical anterior wall.   Mild MR     Medications Prior to Admission: Prior to Admission medications   Medication Sig Start Date End Date Taking? Authorizing Provider  isosorbide mononitrate (IMDUR) 30 MG 24 hr tablet Take 1 tablet (30 mg total) by mouth at bedtime. Take 30 minutes after acetaminophen dose 01/06/20 04/05/20 Yes Marykay Lex, MD  levothyroxine (SYNTHROID) 50 MCG tablet Take 1.5 tablets (75 mcg total) by mouth daily before breakfast. TAKE ONE AND ONE-HALF TABLETS DAILY BEFORE BREAKFAST 01/03/20  Yes Leone Brand, NP  metoprolol succinate (TOPROL-XL) 50 MG 24 hr tablet Take 1 tablet (50 mg total) by mouth 2 (two) times daily. Take with or immediately following a meal. 01/03/20  Yes Leone Brand, NP  nicotine (NICODERM CQ - DOSED IN MG/24 HOURS) 14 mg/24hr  patch Place 1 patch (14 mg total) onto the skin daily. 01/04/20  Yes Isaiah Serge, NP  nitroGLYCERIN (NITROSTAT) 0.4 MG SL tablet Place 1 tablet (0.4 mg total) under the tongue every 5 (five) minutes x 3 doses as needed for chest pain. 01/03/20  Yes Isaiah Serge, NP  rivaroxaban (XARELTO) 20 MG TABS tablet Take 1 tablet (20 mg total) by mouth daily with supper. Patient taking differently: Take 20 mg by mouth daily. 12/25/18  Yes Lendon Colonel, NP  rosuvastatin (CRESTOR) 20 MG tablet Take 1 tablet (20 mg total) by mouth daily. 12/25/18  Yes Lendon Colonel, NP  acetaminophen (TYLENOL) 500 MG tablet Take 500 mg by mouth every 6 (six) hours as needed for mild pain.    [provider]  alum & mag hydroxide-simeth (MAALOX/MYLANTA) 200-200-20 MG/5ML suspension Take 15-30 mLs by mouth every 6 (six) hours as needed for indigestion or flatulence.    [provider]  Calcium Carb-Cholecalciferol  (CALCIUM+D3 PO) Take 1 tablet by mouth daily with breakfast.    [provider]  cetirizine (ZYRTEC) 10 MG tablet TAKE 1 TABLET DAILY Patient taking differently: Take 10 mg by mouth daily. 11/27/17   Elayne Snare, MD  hyoscyamine (LEVSIN) 0.125 MG tablet Take 1 tablet (0.125 mg total) by mouth 2 (two) times daily. 01/03/20   Isaiah Serge, NP  NON FORMULARY Take 1 capsule by mouth See admin instructions. Renew Life Women's Probiotics- Take 1 capsule by mouth once a day    [provider]  NON FORMULARY Take 1 capsule by mouth See admin instructions. NOW Supplements/Peppermint Gels with Ginger & Fennel Oils, Enteric Coated, Digestive Support- Take 1 capsule by mouth before each meal    [provider]  potassium chloride (KLOR-CON) 10 MEQ tablet Take 1 tablet (10 mEq total) by mouth at bedtime. 01/03/20   Isaiah Serge, NP  predniSONE (DELTASONE) 50 MG tablet Please take Prednisone 50mg  by mouth at: Thirteen hours prior to cath 9: 30 pm on Thursday,Seven hours prior to cath 3:30 am on Friday and prior to leaving home please take last dose of Prednisone 50mg  and Benadryl 50mg  by mouth. 8:30 am 01/06/20   Leonie Man, MD  Simethicone 125 MG TABS Chew 125 mg by mouth See admin instructions. Chew 125 mg by mouth three times a day after meals as needed for gas    [provider]  SYMAX-SR 0.375 MG 12 hr tablet Take 0.375 mg by mouth 2 (two) times daily. 12/14/19   [provider]     Allergies:    Allergies  Allergen Reactions  . Atorvastatin Other (See Comments)    Gas pain in abdomen with no relief  . Eluxadoline Nausea And Vomiting and Other (See Comments)    Viberzi- And, made the patient feel "high"  . Iohexol Hives and Rash    Red dye    Social History:   Social History   Socioeconomic History  . Marital status: Married    Spouse name: Not on file  . Number of children: 3  . Years of education: Not on file  . Highest education level:  Not on file  Occupational History    Employer: DISABLED  Tobacco Use  . Smoking status: Current Some Day Smoker    Packs/day: 0.25    Types: Cigarettes  . Smokeless tobacco: Former Systems developer  . Tobacco comment: Quit at time of MI, restarted in 2009  Substance and Sexual Activity  .  Alcohol use: No    Alcohol/week: 0.0 standard drinks  . Drug use: Not on file  . Sexual activity: Not on file  Other Topics Concern  . Not on file  Social History Narrative   Married, mother of 3 children (2 living sons age 83-26 and 30-31; her daughter was the victim of a murder that took place sometime around the time of the patient's MI. She was under significant amount of stress as her daughter had gone missing. She was never found. Finally the suspect was charged and convicted in 2009. She had sessile he stop smoking until that time frame when it brought back memories and she is now back to smoking a half pack a day.   She does not get routine exercise, but is active around the house and doing housecleaning chores.   She does not drink alcohol.   Social Determinants of Health   Financial Resource Strain: Not on file  Food Insecurity: Not on file  Transportation Needs: Not on file  Physical Activity: Not on file  Stress: Not on file  Social Connections: Not on file  Intimate Partner Violence: Not on file    Family History:  The patient's family history includes Breast cancer in her mother; Diabetes in her mother and another family member; Heart failure in an other family member; Thyroid disease in her maternal grandmother and paternal aunt.    ROS:  Please see the history of present illness.  All other ROS reviewed and negative.     Physical Exam/Data:   Vitals:   01/07/20 0145 01/07/20 0215 01/07/20 0230 01/07/20 0245  BP: (!) 152/70 123/73 (!) 155/70 (!) 155/69  Pulse: 62 (!) 59 65 (!) 54  Resp: 17 17 14 16   Temp:      TempSrc:      SpO2: 99% 96% 99% 97%  Weight:      Height:       No  intake or output data in the 24 hours ending 01/07/20 0317 Last 3 Weights 01/06/2020 01/06/2020 01/03/2020  Weight (lbs) 127 lb 127 lb 123 lb 9.6 oz  Weight (kg) 57.607 kg 57.607 kg 56.065 kg     Body mass index is 24 kg/m.  General:  Well nourished, well developed, in no acute distress lying flat HEENT: normal Lymph: no adenopathy Neck: no JVD Endocrine:  No thryomegaly Vascular: No carotid bruits; FA pulses 2+ bilaterally without bruits  Cardiac:  normal S1, S2; RRR; no murmur Lungs:  clear to auscultation bilaterally, no wheezing, rhonchi or rales  Abd: soft, nontender, no hepatomegaly  Ext: trace pedal edema Musculoskeletal:  No deformities, BUE and BLE strength normal and equal Skin: warm and dry  Neuro:  CNs 2-12 intact, no focal abnormalities noted Psych:  Normal affect   ECG personally reviewed and shows anterolateral non-specific ST changes.  Relevant CV Studies: TTE 01/02/20 1. Left ventricular ejection fraction, by estimation, is 45 to 50%. The  left ventricle has mildly decreased function. The left ventricle  demonstrates regional wall motion abnormalities (see scoring  diagram/findings for description). Left ventricular  diastolic parameters are consistent with Grade II diastolic dysfunction  (pseudonormalization).  2. Right ventricular systolic function is normal. The right ventricular  size is normal.  3. The mitral valve is normal in structure. Trivial mitral valve  regurgitation. No evidence of mitral stenosis.  4. The aortic valve is normal in structure. Aortic valve regurgitation is  not visualized. No aortic stenosis is present.  5. The inferior vena  cava is normal in size with greater than 50%  respiratory variability, suggesting right atrial pressure of 3 mmHg.  LV Wall Scoring:  The apical septal segment, apical inferior segment, and apex are akinetic.   Nuclear perfusion stress test 6/16 There is a small defect of moderate severity present in the  mid anterior location. The defect is non-reversible. Small non reversible scar mid anterior wall  Laboratory Data:  High Sensitivity Troponin:   Recent Labs  Lab 01/02/20 1001 01/02/20 1514 01/04/20 2323 01/06/20 2350 01/07/20 0044  TROPONINIHS 649* 1,309* 434* 125* 134*      Chemistry Recent Labs  Lab 01/04/20 2323 01/06/20 2350  NA 141 137  K 3.4* 3.5  CL 107 105  CO2 25 25  GLUCOSE 134* 106*  BUN 7* <5*  CREATININE 0.98 0.88  CALCIUM 9.0 9.2  GFRNONAA >60 >60  ANIONGAP 9 7    Recent Labs  Lab 01/04/20 2323 01/06/20 2350  PROT 6.1* 6.8  ALBUMIN 3.4* 3.7  AST 24 26  ALT 26 31  ALKPHOS 61 62  BILITOT 0.4 0.7   Hematology Recent Labs  Lab 01/04/20 2323 01/06/20 2350  WBC 7.4 7.0  RBC 4.10 4.16  HGB 13.0 13.2  HCT 39.0 39.5  MCV 95.1 95.0  MCH 31.7 31.7  MCHC 33.3 33.4  RDW 12.8 12.8  PLT 181 190   BNPNo results for input(s): BNP, PROBNP in the last 168 hours.  DDimer No results for input(s): DDIMER in the last 168 hours.   Radiology/Studies:  DG Chest Port 1 View  Result Date: 01/06/2020 CLINICAL DATA:  Chest pain EXAM: PORTABLE CHEST 1 VIEW COMPARISON:  01/02/2020 FINDINGS: The lungs are symmetrically mildly hyperinflated. Stable right apical cyst. No pneumothorax or pleural effusion. Coronary artery bypass grafting has been performed. Cardiac size within normal limits. Pulmonary vascularity is normal. No acute bone abnormality. IMPRESSION: No active disease.  COPD. Electronically Signed   By: Fidela Salisbury MD   On: 01/06/2020 23:12    Assessment and Plan:   65 year old Barber with history of CABG x2 1998 after AMI and subsequent SVG grafting, STEMI c/b cardiac arrest and anoxic brain injury proceeding CABG w/ BMS to LAD intervention, paroxysmal AF, IBS, GERD presenting with recurrent chest pain with elevated troponin.  She had previously been planned for catheterization in the setting of ongoing chest pain with Dr. Ellyn Hack.  Agree with invasive  strategy in the setting of unstable angina.  Of note, she has contrast allergy and I have ordered pre-medication.  Additionally ordered pre-hydration in the setting of borderline GFR.  Last dose of rivaroxaban 2130 01/06/20.  Plan #Unstable angina #[place] CAD (100% LAD s/p SVG --> LAD after failed LIMA --> LAD 1998) - ASA 325 and aspirin indefinitely - Heparin infusion, hold rivaroxaban - Metoprolol tartrate 50 mg BID (home dose) - Continue isosorbide mononitrate 30 mg daily, can go up as tolerated, PRN SLN - Recent updated echo - Risk stratification labs; continue statin  #pAF - Heparin infusion as above - Continue metoprolol; if recurrent AF with symptoms may require rhythm control strategy  #Hypertension #HFrEF - Continue metoprolol; favor consolidation to succinate - Start ARB/ARNI following cardiac catheterization - Aldosterone antagonist if renal function tolerates ARNI - Invasive ischemic evaluation as above.  Severity of Illness: The appropriate patient status for this patient is INPATIENT. Inpatient status is judged to be reasonable and necessary in order to provide the required intensity of service to ensure the patient's safety. The patient's  presenting symptoms, physical exam findings, and initial radiographic and laboratory data in the context of their chronic comorbidities is felt to place them at high risk for further clinical deterioration. Furthermore, it is not anticipated that the patient will be medically stable for discharge from the hospital within 2 midnights of admission. The following factors support the patient status of inpatient.   " The patient's presenting symptoms include unstable angina. " The worrisome physical exam findings include sequelae of CABG. " The initial radiographic and laboratory data are worrisome because of abnormal ECG, abnormla troponin. " The chronic co-morbidities include atrial fibrillation, chornic coronary disease, prior anoxic brain  injury, prior STEMI, tobacco use disorder.   * I certify that at the point of admission it is my clinical judgment that the patient will require inpatient hospital care spanning beyond 2 midnights from the point of admission due to high intensity of service, high risk for further deterioration and high frequency of surveillance required.*    For questions or updates, please contact [place] Please consult [email or web address] for contact info under        Signed, Delight Hoh, MD  01/07/2020 3:17 AM

## 2020-01-07 NOTE — Progress Notes (Signed)
[place] for Heparin (Xarelto on hold) Indication: chest pain/ACS and atrial fibrillation  Allergies  Allergen Reactions  . Atorvastatin Other (See Comments)    Gas pain in abdomen with no relief  . Eluxadoline Nausea And Vomiting and Other (See Comments)    Viberzi- And, made the patient feel "high"  . Iohexol Hives and Rash    Red dye    Patient Measurements: Height: 5\' 1"  (154.9 cm) Weight: 57.6 kg (127 lb) IBW/kg (Calculated) : 47.8 Vital Signs: Temp: 97.5 F (36.4 C) (01/04 1913) Temp Source: Oral (01/04 1913) BP: 122/73 (01/04 1913) Pulse Rate: 67 (01/04 1913)  Labs: Recent Labs    01/04/20 2323 01/06/20 2350 01/07/20 0044 01/07/20 0619 01/07/20 0945 01/07/20 1925  HGB 13.0 13.2  --  12.5  --   --   HCT 39.0 39.5  --  37.5  --   --   PLT 181 190  --  175  --   --   APTT  --   --   --   --   --  37*  LABPROT  --  27.9*  --   --   --   --   INR  --  2.7*  --   --   --   --   HEPARINUNFRC  --   --   --   --   --  0.95*  CREATININE 0.98 0.88  --  0.83  --   --   TROPONINIHS 434* 125* 134* 141* 111*  --     Estimated Creatinine Clearance: 55.9 mL/min (by C-G formula based on SCr of 0.83 mg/dL).   Medical History: Past Medical History:  Diagnosis Date  . CAD in native artery 1998   Followup 1 month post MI revealed 95% ISR in PTCA site --> PCI with 3.0 mm x 25 mm BMS stent in proximal LAD --> 5 months later recurrent ISR of LAD BMS that compromised D1 --> referred for CABG .--> Emergent redo single vessel CABG with SVG-mLAD for occluded LIMA  . COPD (chronic obstructive pulmonary disease) ([place])   . Elevated troponin 01/03/2020  . GERD (gastroesophageal reflux disease)   . Hyperlipidemia   . Hypothyroidism    On Synthroid  . Iron deficiency anemia   . Irritable bowel syndrome    Followed by Dr. Juanita Craver.  . LV dysfunction 01/03/2020  . PAF (paroxysmal atrial fibrillation) ([place]) 05/03/2014   New onset - originally  with RVR  . S/P CABG x 2 07/1996   Initially LIMA-LAD, SVG-D1 --> immediate LIMA failure post CABG with anterior MI --> urgent redo SVG-LAD beyond D2.; Widely patent grafts as of 2005 with left dominant system. Graft to the diagonal branch has a very small target vessel but the vein graft to LAD retrograde fills a large bifurcating D2 and antegrade fills a large D3.  . ST elevation (STEMI) myocardial infarction involving left anterior descending coronary artery ([place]) [id number]   Complicated by VF arrest, and anoxic brain injury with status epilepticus; POBA of LAD --> 6 months later, after 2 vessel CABG she had postop complication with occluded LIMA/second anterior STEMI  . Tobacco abuse     Assessment: 65 y/o F with chest pain, extensive cardiac history, on Xarelto PTA for afib, starting heparin per pharmacy, likely cath, mildly elevated troponin, CBC/renal function good. Anticipate using aPTT to dose for now given Xarelto influence on heparin levels. Last dose of Xarelto was 1/3 2130.  Heparin started this morning, initial aPTT is subtherapeutic, heparin level falsely elevated by Xarelto use.    Goal of Therapy:  Heparin level 0.3-0.7 units/ml aPTT 66-102 seconds Monitor platelets by anticoagulation protocol: Yes   Plan:  Increase heparin to 900 units/h Heparin level and aPTT with am labs   Fredonia Highland, PharmD, BCPS, [facility] Clinical Pharmacist [phone] Please check AMION for all [facility] Pharm[facility] numbers 01/07/2020

## 2020-01-07 NOTE — Progress Notes (Signed)
ANTICOAGULATION CONSULT NOTE - Initial Consult  Pharmacy Consult for Heparin (Xarelto on hold) Indication: chest pain/ACS and atrial fibrillation  Allergies  Allergen Reactions  . Atorvastatin Other (See Comments)    Gas pain in abdomen with no relief  . Eluxadoline Nausea And Vomiting and Other (See Comments)    Viberzi- And, made the patient feel "high"  . Iohexol Hives and Rash    Red dye    Patient Measurements: Height: 5\' 1"  (154.9 cm) Weight: 57.6 kg (127 lb) IBW/kg (Calculated) : 47.8 Vital Signs: Temp: 98.4 F (36.9 C) (01/04 0432) Temp Source: Oral (01/04 0432) BP: 138/81 (01/04 0432) Pulse Rate: 53 (01/04 0432)  Labs: Recent Labs    01/04/20 2323 01/06/20 2350 01/07/20 0044 01/07/20 0619  HGB 13.0 13.2  --  12.5  HCT 39.0 39.5  --  37.5  PLT 181 190  --  175  LABPROT  --  27.9*  --   --   INR  --  2.7*  --   --   CREATININE 0.98 0.88  --  0.83  TROPONINIHS 434* 125* 134*  --     Estimated Creatinine Clearance: 55.9 mL/min (by C-G formula based on SCr of 0.83 mg/dL).   Medical History: Past Medical History:  Diagnosis Date  . CAD in native artery 1998   Followup 1 month post MI revealed 95% ISR in PTCA site --> PCI with 3.0 mm x 25 mm BMS stent in proximal LAD --> 5 months later recurrent ISR of LAD BMS that compromised D1 --> referred for CABG .--> Emergent redo single vessel CABG with SVG-mLAD for occluded LIMA  . COPD (chronic obstructive pulmonary disease) ([place])   . Elevated troponin 01/03/2020  . GERD (gastroesophageal reflux disease)   . Hyperlipidemia   . Hypothyroidism    On Synthroid  . Iron deficiency anemia   . Irritable bowel syndrome    Followed by Dr. Juanita Craver.  . LV dysfunction 01/03/2020  . PAF (paroxysmal atrial fibrillation) ([place]) 05/03/2014   New onset - originally with RVR  . S/P CABG x 2 07/1996   Initially LIMA-LAD, SVG-D1 --> immediate LIMA failure post CABG with anterior MI --> urgent redo SVG-LAD beyond D2.; Widely  patent grafts as of 2005 with left dominant system. Graft to the diagonal branch has a very small target vessel but the vein graft to LAD retrograde fills a large bifurcating D2 and antegrade fills a large D3.  . ST elevation (STEMI) myocardial infarction involving left anterior descending coronary artery ([place]) 123456   Complicated by VF arrest, and anoxic brain injury with status epilepticus; POBA of LAD --> 6 months later, after 2 vessel CABG she had postop complication with occluded LIMA/second anterior STEMI  . Tobacco abuse     Assessment: 65 y/o F with chest pain, extensive cardiac history, on Xarelto PTA for afib, starting heparin per pharmacy, likely cath, mildly elevated troponin, CBC/renal function good. Anticipate using aPTT to dose for now given Xarelto influence on heparin levels.   Last dose of Xarelto was 1/3 2130.   After discussion with cardiology this morning, given presentation and ongoing symptoms they would like heparin to be started earlier this morning. Will start at low dose and titrate as indicated.   Goal of Therapy:  Heparin level 0.3-0.7 units/ml aPTT 66-102 seconds Monitor platelets by anticoagulation protocol: Yes   Plan:  Start heparin drip at 700 units/hr this morning 1800heparin level and aPTT  Erin Hearing PharmD., BCPS Clinical Pharmacist 01/07/2020  8:21 AM

## 2020-01-07 NOTE — Progress Notes (Addendum)
Progress Note  Patient Name: Dominique Barber Date of Encounter: 01/07/2020  [facility] Cardiologist: Glenetta Hew, MD   Subjective   Mild chest discomfort.  No shortness of breath or palpitation.  Inpatient Medications    Scheduled Meds: . [START ON 01/08/2020] aspirin EC  81 mg Oral Daily  . hyoscyamine  0.125 mg Oral BID  . isosorbide mononitrate  30 mg Oral QHS  . levothyroxine  75 mcg Oral QAC breakfast  . metoprolol succinate  50 mg Oral BID  . nicotine  14 mg Transdermal Daily  . rosuvastatin  20 mg Oral Daily   Continuous Infusions: . sodium chloride 100 mL/hr at 01/07/20 0359  . heparin     PRN Meds: acetaminophen, alum & mag hydroxide-simeth, nitroGLYCERIN, nitroGLYCERIN, ondansetron (ZOFRAN) IV, simethicone   Vital Signs    Vitals:   01/07/20 0245 01/07/20 0345 01/07/20 0411 01/07/20 0432  BP: (!) 155/69 (!) 153/80  138/81  Pulse: (!) 54 (!) 58  (!) 53  Resp: 16 18  18   Temp:   98.2 F (36.8 C) 98.4 F (36.9 C)  TempSrc:   Oral Oral  SpO2: 97% 96%  96%  Weight:      Height:        Intake/Output Summary (Last 24 hours) at 01/07/2020 0722 Last data filed at 01/07/2020 0359 Gross per 24 hour  Intake 100 ml  Output --  Net 100 ml   Last 3 Weights 01/06/2020 01/06/2020 01/03/2020  Weight (lbs) 127 lb 127 lb 123 lb 9.6 oz  Weight (kg) 57.607 kg 57.607 kg 56.065 kg      Telemetry    Sinus bradycardia in 50s- Personally Reviewed  ECG    Sinus rhythm with anterior lateral T wave inversion which is improved compared to few days ago- Personally Reviewed  Physical Exam   GEN: No acute distress.   Neck: No JVD Cardiac: RRR, no murmurs, rubs, or gallops.  Respiratory: Clear to auscultation bilaterally. GI: Soft, nontender, non-distended  MS: No edema; No deformity. Neuro:  Nonfocal  Psych: Normal affect   Labs    High Sensitivity Troponin:   Recent Labs  Lab 01/02/20 1001 01/02/20 1514 01/04/20 2323 01/06/20 2350 01/07/20 0044   TROPONINIHS 649* 1,309* 434* 125* 134*      Chemistry Recent Labs  Lab 01/02/20 0617 01/04/20 2323 01/06/20 2350  NA 137 141 137  K 3.7 3.4* 3.5  CL 107 107 105  CO2 17* 25 25  GLUCOSE 82 134* 106*  BUN 15 7* <5*  CREATININE 0.94 0.98 0.88  CALCIUM 8.8* 9.0 9.2  PROT 6.6 6.1* 6.8  ALBUMIN 3.7 3.4* 3.7  AST 29 24 26   ALT 28 26 31   ALKPHOS 64 61 62  BILITOT 0.9 0.4 0.7  GFRNONAA >60 >60 >60  ANIONGAP 13 9 7      Hematology Recent Labs  Lab 01/02/20 0617 01/04/20 2323 01/06/20 2350  WBC 9.3 7.4 7.0  RBC 4.65 4.10 4.16  HGB 14.5 13.0 13.2  HCT 43.6 39.0 39.5  MCV 93.8 95.1 95.0  MCH 31.2 31.7 31.7  MCHC 33.3 33.3 33.4  RDW 12.6 12.8 12.8  PLT 218 181 190    Radiology    DG Chest Port 1 View  Result Date: 01/06/2020 CLINICAL DATA:  Chest pain EXAM: PORTABLE CHEST 1 VIEW COMPARISON:  01/02/2020 FINDINGS: The lungs are symmetrically mildly hyperinflated. Stable right apical cyst. No pneumothorax or pleural effusion. Coronary artery bypass grafting has been performed. Cardiac size within  normal limits. Pulmonary vascularity is normal. No acute bone abnormality. IMPRESSION: No active disease.  COPD. Electronically Signed   By: Fidela Salisbury MD   On: 01/06/2020 23:12    Cardiac Studies    Echo 01/02/2020 1. Left ventricular ejection fraction, by estimation, is 45 to 50%. The  left ventricle has mildly decreased function. The left ventricle  demonstrates regional wall motion abnormalities (see scoring  diagram/findings for description). Left ventricular  diastolic parameters are consistent with Grade II diastolic dysfunction  (pseudonormalization).  2. Right ventricular systolic function is normal. The right ventricular  size is normal.  3. The mitral valve is normal in structure. Trivial mitral valve  regurgitation. No evidence of mitral stenosis.  4. The aortic valve is normal in structure. Aortic valve regurgitation is  not visualized. No aortic stenosis  is present.  5. The inferior vena cava is normal in size with greater than 50%  respiratory variability, suggesting right atrial pressure of 3 mmHg.   Patient Profile     65 y.o. female with hx of CAD s/p STEMI 1998w/ VF arrest & anoxic brain injury w/ status epilepticus, s/p blow-by w/ BMS LAD 1 mo later. ISR>>CABG 1998 w/ LIMA-LAD & SVG-D1,AMI w/emergent re-operation w/ SVG-LAD 1998, PAF, COPD, GERD, IBS, HTN, HLDwho is scheduled for outpatient cardiac catheterization 01/10/2020 however presented with recurrent angina.   Admitted with [name] RVR 12/30-12/31 -> she had upper abdominal pain that resolved with conversion to sinus rhythm.  She had troponin elevation of 1309, reduced EF on echocardiogram with regional wall motion abnormality and dynamic ST and T wave changes with T wave inversions on EKG. Plan was for outpatient [facility] as she was very adamant about leaving. Patient declined cardiac catheterization.   Had episode of chest pain January 1 and 2.  Relieved with nitroglycerin requiring to call EMS but declined further evaluation.  Seen by Dr. Ellyn Hack January 3 and felt symptoms due to unstable angina and recommended cardiac cath.  Assessment & Plan    1.  Unstable angina/non-STEMI with history of CAD - Recurrent episode of chest pressure yesterday relieved after sublingual nitroglycerin x3.  - Hs troponin 125>>134 (was peaked to 1309 on 12/30).  - EKG with improved T wave changes from few days ago - Currently mild 1-2/10 chest discomfort.  - Start Nitro paste. If recurrent episode >> will start nitro gtt - Her last dose of Xarelto was 01/06/2019 late evening>> Continue IV heparin>> cath tomorrow - Cycle troponin  - Continue ASA, statin and BB  2. PAF - Maintaining sinus rhythm  - Hold Xarelto for cath , on heparin - Continue BB (will not up-tirate due to bradycardia in 50s)  3. HLD - 02/06/2019: Cholesterol, Total 119; HDL 52; LDL Chol Calc (NIH) 46; Triglycerides  119  - Up-titrate Crestor based on cath  - Will repeat lab  4. Cardiomyopathy - Recent echo 12/30 showed LVEF of 45-50% regional wall motion abnormality. Grade 2 diastolic dysfunction. -Continue Toprol-XL -Consider adding ACE or ARB/spironolactone post cardiac catheterization  5. HTN - Continue BB  For questions or updates, please contact [place] Please consult [email or web address] for contact info under        Signed, Leanor Kail, PA  01/07/2020, 7:22 AM    Agree with note by Vin Bhagat PA-C  No further chest pain.  Xarelto on hold.  We will start IV heparin.  Enzymes low and flat.  EKG shows no acute changes.  Initially she was scheduled for diagnostic  coronary angiogram today which was put off until tomorrow because of the proximity to her last Xarelto dose.  Her exam is benign.  We will also start low-dose long-acting oral nitrates.  Lorretta Harp, M.D., [place], [facility], [place] [address] [place], [place]  [address]  [phone] 01/07/2020 10:28 AM

## 2020-01-07 NOTE — Progress Notes (Signed)
ANTICOAGULATION CONSULT NOTE - Initial Consult  Pharmacy Consult for Heparin (Xarelto on hold) Indication: chest pain/ACS and atrial fibrillation  Allergies  Allergen Reactions  . Atorvastatin Other (See Comments)    Gas pain in abdomen with no relief  . Eluxadoline Nausea And Vomiting and Other (See Comments)    Viberzi- And, made the patient feel "high"  . Iohexol Hives and Rash    Red dye    Patient Measurements: Height: 5\' 1"  (154.9 cm) Weight: 57.6 kg (127 lb) IBW/kg (Calculated) : 47.8 Vital Signs: Temp: 98.4 F (36.9 C) (01/04 0432) Temp Source: Oral (01/04 0432) BP: 138/81 (01/04 0432) Pulse Rate: 53 (01/04 0432)  Labs: Recent Labs    01/04/20 2323 01/06/20 2350 01/07/20 0044  HGB 13.0 13.2  --   HCT 39.0 39.5  --   PLT 181 190  --   LABPROT  --  27.9*  --   INR  --  2.7*  --   CREATININE 0.98 0.88  --   TROPONINIHS 434* 125* 134*    Estimated Creatinine Clearance: 52.7 mL/min (by C-G formula based on SCr of 0.88 mg/dL).   Medical History: Past Medical History:  Diagnosis Date  . CAD in native artery 1998   Followup 1 month post MI revealed 95% ISR in PTCA site --> PCI with 3.0 mm x 25 mm BMS stent in proximal LAD --> 5 months later recurrent ISR of LAD BMS that compromised D1 --> referred for CABG .--> Emergent redo single vessel CABG with SVG-mLAD for occluded LIMA  . COPD (chronic obstructive pulmonary disease) ([facility])   . Elevated troponin 01/03/2020  . GERD (gastroesophageal reflux disease)   . Hyperlipidemia   . Hypothyroidism    On Synthroid  . Iron deficiency anemia   . Irritable bowel syndrome    Followed by Dr. 01/05/2020.  . LV dysfunction 01/03/2020  . PAF (paroxysmal atrial fibrillation) ([facility]) 05/03/2014   New onset - originally with RVR  . S/P CABG x 2 07/1996   Initially LIMA-LAD, SVG-D1 --> immediate LIMA failure post CABG with anterior MI --> urgent redo SVG-LAD beyond D2.; Widely patent grafts as of 2005 with left dominant system.  Graft to the diagonal branch has a very small target vessel but the vein graft to LAD retrograde fills a large bifurcating D2 and antegrade fills a large D3.  . ST elevation (STEMI) myocardial infarction involving left anterior descending coronary artery ([facility]) 01/19/1996   Complicated by VF arrest, and anoxic brain injury with status epilepticus; POBA of LAD --> 6 months later, after 2 vessel CABG she had postop complication with occluded LIMA/second anterior STEMI  . Tobacco abuse     Assessment: 65 y/o F with chest pain, extensive cardiac history, on Xarelto PTA for afib, starting heparin per pharmacy, likely cath, mildly elevated troponin, CBC/renal function good. Anticipate using aPTT to dose for now given Xarelto influence on heparin levels.   Last dose of Xarelto was 1/3 2130, will start heparin 24 hours from this dose  Goal of Therapy:  Heparin level 0.3-0.7 units/ml aPTT 66-102 seconds Monitor platelets by anticoagulation protocol: Yes   Plan:  Start heparin drip at 700 units/hr at 2130 today 0500 heparin level and aPTT on 1/5  2131, PharmD, BCPS Clinical Pharmacist Phone: [phone]

## 2020-01-08 ENCOUNTER — Encounter (HOSPITAL_COMMUNITY): Payer: Self-pay | Admitting: Cardiology

## 2020-01-08 ENCOUNTER — Encounter (HOSPITAL_COMMUNITY)
Admission: EM | Disposition: A | Discharge status: Home / Self Care | Payer: Self-pay | Source: Home / Self Care | Attending: Cardiology

## 2020-01-08 DIAGNOSIS — I25710 Atherosclerosis of autologous vein coronary artery bypass graft(s) with unstable angina pectoris: Secondary | ICD-10-CM

## 2020-01-08 DIAGNOSIS — I25700 Atherosclerosis of coronary artery bypass graft(s), unspecified, with unstable angina pectoris: Secondary | ICD-10-CM

## 2020-01-08 DIAGNOSIS — I214 Non-ST elevation (NSTEMI) myocardial infarction: Secondary | ICD-10-CM

## 2020-01-08 DIAGNOSIS — I200 Unstable angina: Secondary | ICD-10-CM | POA: Diagnosis not present

## 2020-01-08 DIAGNOSIS — I25810 Atherosclerosis of coronary artery bypass graft(s) without angina pectoris: Secondary | ICD-10-CM | POA: Diagnosis not present

## 2020-01-08 HISTORY — PX: LEFT HEART CATH AND CORS/GRAFTS ANGIOGRAPHY: CATH118250

## 2020-01-08 HISTORY — PX: CORONARY STENT INTERVENTION: CATH118234

## 2020-01-08 LAB — CBC
HCT: 34.3 % — ABNORMAL LOW (ref 36.0–46.0)
HCT: 34.8 % — ABNORMAL LOW (ref 36.0–46.0)
Hemoglobin: 11.4 g/dL — ABNORMAL LOW (ref 12.0–15.0)
Hemoglobin: 11.8 g/dL — ABNORMAL LOW (ref 12.0–15.0)
MCH: 31.3 pg (ref 26.0–34.0)
MCH: 31.2 pg (ref 26.0–34.0)
MCHC: 33.2 g/dL (ref 30.0–36.0)
MCHC: 33.9 g/dL (ref 30.0–36.0)
MCV: 94.2 fL (ref 80.0–100.0)
MCV: 92.1 fL (ref 80.0–100.0)
Platelets: 168 10*3/uL (ref 150–400)
Platelets: 209 10*3/uL (ref 150–400)
RBC: 3.64 MIL/uL — ABNORMAL LOW (ref 3.87–5.11)
RBC: 3.78 MIL/uL — ABNORMAL LOW (ref 3.87–5.11)
RDW: 12.7 % (ref 11.5–15.5)
RDW: 13.1 % (ref 11.5–15.5)
WBC: 8.3 10*3/uL (ref 4.0–10.5)
WBC: 11.1 10*3/uL — ABNORMAL HIGH (ref 4.0–10.5)
nRBC: 0 % (ref 0.0–0.2)
nRBC: 0 % (ref 0.0–0.2)

## 2020-01-08 LAB — POCT ACTIVATED CLOTTING TIME
Activated Clotting Time: 279 seconds
Activated Clotting Time: 243 seconds
Activated Clotting Time: 184 seconds
Activated Clotting Time: 160 seconds

## 2020-01-08 LAB — APTT: aPTT: 85 seconds — ABNORMAL HIGH (ref 24–36)

## 2020-01-08 LAB — PROTIME-INR
INR: 1.1 (ref 0.8–1.2)
Prothrombin Time: 14.2 seconds (ref 11.4–15.2)

## 2020-01-08 LAB — HEPARIN LEVEL (UNFRACTIONATED): Heparin Unfractionated: 0.81 IU/mL — ABNORMAL HIGH (ref 0.30–0.70)

## 2020-01-08 SURGERY — LEFT HEART CATH AND CORS/GRAFTS ANGIOGRAPHY
Anesthesia: LOCAL

## 2020-01-08 MED ORDER — MORPHINE SULFATE (PF) 2 MG/ML IV SOLN
2.0000 mg | Freq: Once | INTRAVENOUS | Status: AC
  Administered 2020-01-08: 2 mg via INTRAVENOUS
  Filled 2020-01-08: qty 1

## 2020-01-08 MED ORDER — SODIUM CHLORIDE 0.9 % IV SOLN: 250.0000 mL | INTRAVENOUS | Status: DC | PRN

## 2020-01-08 MED ORDER — ONDANSETRON HCL 4 MG/2ML IJ SOLN
4.0000 mg | Freq: Four times a day (QID) | INTRAMUSCULAR | Status: DC | PRN
  Administered 2020-01-08 – 2020-01-09 (×2): 4 mg via INTRAVENOUS
  Filled 2020-01-08 (×2): qty 2

## 2020-01-08 MED ORDER — ALUM & MAG HYDROXIDE-SIMETH 200-200-20 MG/5ML PO SUSP
15.0000 mL | Freq: Four times a day (QID) | ORAL | Status: DC | PRN
  Administered 2020-01-08: 30 mL via ORAL
  Filled 2020-01-08: qty 30

## 2020-01-08 MED ORDER — HEPARIN SODIUM (PORCINE) 1000 UNIT/ML IJ SOLN
INTRAMUSCULAR | Status: AC
  Filled 2020-01-08: qty 1

## 2020-01-08 MED ORDER — MIDAZOLAM HCL 2 MG/2ML IJ SOLN
INTRAMUSCULAR | Status: AC
  Filled 2020-01-08: qty 2

## 2020-01-08 MED ORDER — ACETAMINOPHEN 325 MG PO TABS
650.0000 mg | ORAL_TABLET | ORAL | Status: DC | PRN
  Administered 2020-01-08 – 2020-01-09 (×5): 650 mg via ORAL
  Filled 2020-01-08 (×5): qty 2

## 2020-01-08 MED ORDER — SODIUM CHLORIDE 0.9% FLUSH: 3.0000 mL | INTRAVENOUS | Status: DC | PRN

## 2020-01-08 MED ORDER — HYDRALAZINE HCL 20 MG/ML IJ SOLN
INTRAMUSCULAR | Status: AC
  Filled 2020-01-08: qty 1

## 2020-01-08 MED ORDER — FENTANYL CITRATE (PF) 100 MCG/2ML IJ SOLN
INTRAMUSCULAR | Status: AC
  Filled 2020-01-08: qty 2

## 2020-01-08 MED ORDER — LABETALOL HCL 5 MG/ML IV SOLN: 10.0000 mg | INTRAVENOUS | Status: DC | PRN

## 2020-01-08 MED ORDER — NITROGLYCERIN 1 MG/10 ML FOR IR/CATH LAB
INTRA_ARTERIAL | Status: AC
  Filled 2020-01-08: qty 10

## 2020-01-08 MED ORDER — FAMOTIDINE IN NACL 20-0.9 MG/50ML-% IV SOLN
INTRAVENOUS | Status: AC | PRN
  Administered 2020-01-08: 20 mg via INTRAVENOUS

## 2020-01-08 MED ORDER — ALUM & MAG HYDROXIDE-SIMETH 200-200-20 MG/5ML PO SUSP
ORAL | Status: DC | PRN
  Administered 2020-01-08: 30 mL via ORAL

## 2020-01-08 MED ORDER — VERAPAMIL HCL 2.5 MG/ML IV SOLN
INTRAVENOUS | Status: AC
  Filled 2020-01-08: qty 2

## 2020-01-08 MED ORDER — IOHEXOL 350 MG/ML SOLN
INTRAVENOUS | Status: DC | PRN
  Administered 2020-01-08: 115 mL

## 2020-01-08 MED ORDER — ADENOSINE 6 MG/2ML IV SOLN
INTRAVENOUS | Status: AC
  Filled 2020-01-08: qty 2

## 2020-01-08 MED ORDER — ASPIRIN 81 MG PO CHEW
81.0000 mg | CHEWABLE_TABLET | Freq: Every day | ORAL | Status: DC
  Administered 2020-01-09: 81 mg via ORAL
  Filled 2020-01-08: qty 1

## 2020-01-08 MED ORDER — HEPARIN (PORCINE) IN NACL 1000-0.9 UT/500ML-% IV SOLN
INTRAVENOUS | Status: DC | PRN
  Administered 2020-01-08 (×2): 500 mL

## 2020-01-08 MED ORDER — HYDRALAZINE HCL 20 MG/ML IJ SOLN
INTRAMUSCULAR | Status: DC | PRN
  Administered 2020-01-08: 10 mg via INTRAVENOUS

## 2020-01-08 MED ORDER — HEPARIN SODIUM (PORCINE) 1000 UNIT/ML IJ SOLN
INTRAMUSCULAR | Status: DC | PRN
  Administered 2020-01-08: 5000 [IU] via INTRAVENOUS
  Administered 2020-01-08: 1000 [IU] via INTRAVENOUS

## 2020-01-08 MED ORDER — IOHEXOL 350 MG/ML SOLN
INTRAVENOUS | Status: AC
  Filled 2020-01-08: qty 1

## 2020-01-08 MED ORDER — FENTANYL CITRATE (PF) 100 MCG/2ML IJ SOLN
INTRAMUSCULAR | Status: DC | PRN
  Administered 2020-01-08 (×2): 25 ug via INTRAVENOUS

## 2020-01-08 MED ORDER — CLOPIDOGREL BISULFATE 300 MG PO TABS
ORAL_TABLET | ORAL | Status: DC | PRN
  Administered 2020-01-08: 600 mg via ORAL

## 2020-01-08 MED ORDER — ALUM & MAG HYDROXIDE-SIMETH 200-200-20 MG/5ML PO SUSP
ORAL | Status: AC
  Filled 2020-01-08: qty 30

## 2020-01-08 MED ORDER — HYDRALAZINE HCL 20 MG/ML IJ SOLN: 10.0000 mg | INTRAMUSCULAR | Status: DC | PRN

## 2020-01-08 MED ORDER — LIDOCAINE HCL (PF) 1 % IJ SOLN
INTRAMUSCULAR | Status: DC | PRN
  Administered 2020-01-08: 10 mL

## 2020-01-08 MED ORDER — SODIUM CHLORIDE 0.9% FLUSH
3.0000 mL | Freq: Two times a day (BID) | INTRAVENOUS | Status: DC
  Administered 2020-01-08 – 2020-01-09 (×3): 3 mL via INTRAVENOUS

## 2020-01-08 MED ORDER — FAMOTIDINE IN NACL 20-0.9 MG/50ML-% IV SOLN
INTRAVENOUS | Status: AC
  Filled 2020-01-08: qty 50

## 2020-01-08 MED ORDER — SODIUM CHLORIDE 0.9 % IV SOLN: INTRAVENOUS | Status: DC

## 2020-01-08 MED ORDER — RIVAROXABAN 20 MG PO TABS: 20.0000 mg | ORAL_TABLET | Freq: Every day | ORAL | Status: DC

## 2020-01-08 MED ORDER — CLOPIDOGREL BISULFATE 300 MG PO TABS
ORAL_TABLET | ORAL | Status: AC
  Filled 2020-01-08: qty 2

## 2020-01-08 MED ORDER — LIDOCAINE HCL (PF) 1 % IJ SOLN
INTRAMUSCULAR | Status: AC
  Filled 2020-01-08: qty 30

## 2020-01-08 MED ORDER — MIDAZOLAM HCL 2 MG/2ML IJ SOLN
INTRAMUSCULAR | Status: DC | PRN
  Administered 2020-01-08 (×3): 1 mg via INTRAVENOUS

## 2020-01-08 MED ORDER — CLOPIDOGREL BISULFATE 75 MG PO TABS
75.0000 mg | ORAL_TABLET | Freq: Every day | ORAL | Status: DC
  Administered 2020-01-09: 75 mg via ORAL
  Filled 2020-01-08: qty 1

## 2020-01-08 MED ORDER — VERAPAMIL HCL 2.5 MG/ML IV SOLN
INTRAVENOUS | Status: DC | PRN
  Administered 2020-01-08: 200 ug via INTRACORONARY

## 2020-01-08 MED ORDER — HEPARIN (PORCINE) IN NACL 1000-0.9 UT/500ML-% IV SOLN
INTRAVENOUS | Status: AC
  Filled 2020-01-08: qty 1000

## 2020-01-08 SURGICAL SUPPLY — 17 items
BALLN SAPPHIRE 2.5X12 (BALLOONS) ×2
BALLOON SAPPHIRE 2.5X12 (BALLOONS) ×1 IMPLANT
CATH INFINITI 5FR MULTPACK ANG (CATHETERS) ×2 IMPLANT
CATH LAUNCHER 6FR JR4 (CATHETERS) ×2 IMPLANT
DEVICE SPIDERFX EMB PROT 3MM (WIRE) ×2 IMPLANT
FILTERWIRE EZ 2.25-3.5 190CM (FILTER) ×2 IMPLANT
KIT ENCORE 26 ADVANTAGE (KITS) ×2 IMPLANT
KIT HEART LEFT (KITS) ×2 IMPLANT
PACK CARDIAC CATHETERIZATION (CUSTOM PROCEDURE TRAY) ×2 IMPLANT
SHEATH PINNACLE 5F 10CM (SHEATH) ×2 IMPLANT
SHEATH PINNACLE 6F 10CM (SHEATH) ×2 IMPLANT
SHEATH PROBE COVER 6X72 (BAG) ×2 IMPLANT
STENT RESOLUTE ONYX 3.5X38 (Permanent Stent) ×2 IMPLANT
TRANSDUCER W/STOPCOCK (MISCELLANEOUS) ×2 IMPLANT
TUBING CIL FLEX 10 FLL-RA (TUBING) ×2 IMPLANT
WIRE ASAHI PROWATER 180CM (WIRE) ×2 IMPLANT
WIRE EMERALD 3MM-J .035X150CM (WIRE) ×2 IMPLANT

## 2020-01-08 NOTE — Interval H&P Note (Signed)
History and Physical Interval Note:  01/08/2020 8:12 AM  Dominique Barber  has presented today for surgery, with the diagnosis of acute coronary syndrome.  The various methods of treatment have been discussed with the patient and family. After consideration of risks, benefits and other options for treatment, the patient has consented to  Procedure(s): LEFT HEART CATH AND CORS/GRAFTS ANGIOGRAPHY (N/A)  PERCUTANEOUS CORONARY INTERVENTION  as a surgical intervention.  The patient's history has been reviewed, patient examined, no change in status, stable for surgery.  I have reviewed the patient's chart and labs.  Questions were answered to the patient's satisfaction.    Cath Lab Visit (complete for each Cath Lab visit)  Clinical Evaluation Leading to the Procedure:   ACS: Yes.    Non-ACS:    Anginal Classification: CCS IV  Anti-ischemic medical therapy: Maximal Therapy (2 or more classes of medications)  Non-Invasive Test Results: No non-invasive testing performed  Prior CABG: Previous CABG     Bryan Lemma

## 2020-01-08 NOTE — Progress Notes (Signed)
Site area: right groin  Site Prior to Removal:  Level 0  Pressure Applied For 20 MINUTES    Minutes Beginning at 1430  Manual:   Yes.    Patient Status During Pull:  Tolerated well with no problems  Post Pull Groin Site:  Level 0  Post Pull Instructions Given:  Yes.    Post Pull Pulses Present:  Yes.    Dressing Applied:  Yes.  Gauze secured with tegaderm.   Comments:  Report to Estelle June  RN,  Bedrest started at 1450 for 4 hours until 1850.

## 2020-01-08 NOTE — Progress Notes (Signed)
[place] for Heparin (Xarelto on hold) Indication: chest pain/ACS and atrial fibrillation  Allergies  Allergen Reactions  . Atorvastatin Other (See Comments)    Gas pain in abdomen with no relief  . Eluxadoline Nausea And Vomiting and Other (See Comments)    Viberzi- And, made the patient feel "high"  . Iohexol Hives and Rash    Red dye    Patient Measurements: Height: 5\' 1"  (154.9 cm) Weight: 56.4 kg (124 lb 6.4 oz) IBW/kg (Calculated) : 47.8 Vital Signs: Temp: 97.6 F (36.4 C) (01/05 0336) Temp Source: Axillary (01/05 0336) BP: 120/78 (01/05 0336) Pulse Rate: 62 (01/05 0336)  Labs: Recent Labs    01/06/20 2350 01/07/20 0044 01/07/20 0619 01/07/20 0945 01/07/20 1925 01/08/20 0424  HGB 13.2  --  12.5  --   --  11.4*  HCT 39.5  --  37.5  --   --  34.3*  PLT 190  --  175  --   --  168  APTT  --   --   --   --  37* 85*  LABPROT 27.9*  --   --   --   --  14.2  INR 2.7*  --   --   --   --  1.1  HEPARINUNFRC  --   --   --   --  0.95* 0.81*  CREATININE 0.88  --  0.83  --   --   --   TROPONINIHS 125* 134* 141* 111*  --   --     Estimated Creatinine Clearance: 51.7 mL/min (by C-G formula based on SCr of 0.83 mg/dL).   Medical History: Past Medical History:  Diagnosis Date  . CAD in native artery 1998   Followup 1 month post MI revealed 95% ISR in PTCA site --> PCI with 3.0 mm x 25 mm BMS stent in proximal LAD --> 5 months later recurrent ISR of LAD BMS that compromised D1 --> referred for CABG .--> Emergent redo single vessel CABG with SVG-mLAD for occluded LIMA  . COPD (chronic obstructive pulmonary disease) ([place])   . Elevated troponin 01/03/2020  . GERD (gastroesophageal reflux disease)   . Hyperlipidemia   . Hypothyroidism    On Synthroid  . Iron deficiency anemia   . Irritable bowel syndrome    Followed by Dr. Juanita Craver.  . LV dysfunction 01/03/2020  . PAF (paroxysmal atrial fibrillation) ([place]) 05/03/2014   New onset -  originally with RVR  . S/P CABG x 2 07/1996   Initially LIMA-LAD, SVG-D1 --> immediate LIMA failure post CABG with anterior MI --> urgent redo SVG-LAD beyond D2.; Widely patent grafts as of 2005 with left dominant system. Graft to the diagonal branch has a very small target vessel but the vein graft to LAD retrograde fills a large bifurcating D2 and antegrade fills a large D3.  . ST elevation (STEMI) myocardial infarction involving left anterior descending coronary artery ([place]) 123456   Complicated by VF arrest, and anoxic brain injury with status epilepticus; POBA of LAD --> 6 months later, after 2 vessel CABG she had postop complication with occluded LIMA/second anterior STEMI  . Tobacco abuse     Assessment: 65 y/o F with chest pain, extensive cardiac history, on Xarelto PTA for afib, starting heparin per pharmacy, likely cath, mildly elevated troponin, CBC/renal function good. Anticipate using aPTT to dose for now given Xarelto influence on heparin levels. Last dose of Xarelto was 1/3 2130.   Heparin started  1/4, aPTT at goal this morning, heparin level falsely elevated by Xarelto use but seems to be clearing.  Cath this morning.   Goal of Therapy:  Heparin level 0.3-0.7 units/ml aPTT 66-102 seconds Monitor platelets by anticoagulation protocol: Yes   Plan:  Continue heparin at 900 units/h Follow up after cath this morning  Sheppard Coil PharmD., BCPS Clinical Pharmacist 01/08/2020 7:51 AM

## 2020-01-08 NOTE — Progress Notes (Signed)
1545 Right groin dressing saturated. Level one bleeding with small hematoma. Dressing removed and pressure held for 20 minutes. 1605 level 0 no bleeding or hematoma, slight bruising noted. Gauze dressing applied. Secured with Tegaderm. Bedrest extended to 2005. Reviewed instructions with patient. Patient stated she coughed. Reinforced need to call after coughing or sneezing. Right pedal pulse + 2 without change.

## 2020-01-08 NOTE — Progress Notes (Addendum)
Progress Note  Patient Name: Dominique Barber Date of Encounter: 01/08/2020  [facility] Cardiologist: Glenetta Hew, MD   Subjective   Just back from cath lab.   Inpatient Medications    Scheduled Meds: . [MAR Hold] aspirin EC  81 mg Oral Daily  . [MAR Hold] hyoscyamine  0.125 mg Oral BID  . [MAR Hold] isosorbide mononitrate  30 mg Oral Daily  . [MAR Hold] levothyroxine  75 mcg Oral QAC breakfast  . [MAR Hold] metoprolol succinate  50 mg Oral BID  . [MAR Hold] nicotine  14 mg Transdermal Daily  . [MAR Hold] rosuvastatin  20 mg Oral Daily   Continuous Infusions: . sodium chloride 100 mL/hr at 01/08/20 0606  . famotidine    . heparin Stopped (01/08/20 0744)   PRN Meds: [MAR Hold] acetaminophen, [MAR Hold] alum & mag hydroxide-simeth, alum & mag hydroxide-simeth, clopidogrel, famotidine, fentaNYL, Heparin (Porcine) in NaCl, heparin sodium (porcine), hydrALAZINE, iohexol, lidocaine (PF), midazolam, [MAR Hold] nitroGLYCERIN, [MAR Hold] ondansetron (ZOFRAN) IV, [MAR Hold] simethicone, verapamil   Vital Signs    Vitals:   01/08/20 0947 01/08/20 0952 01/08/20 0957 01/08/20 1002  BP: (!) 155/82 (!) 156/77 132/71 (!) 144/89  Pulse: 78 (!) 56 60 64  Resp: _0 Temp:      TempSrc:      SpO2: 100% 100% 99%   Weight:      Height:        Intake/Output Summary (Last 24 hours) at 01/08/2020 1007 Last data filed at 01/08/2020 0930 Gross per 24 hour  Intake 133.41 ml  Output 400 ml  Net -266.59 ml   Last 3 Weights 01/08/2020 01/06/2020 01/06/2020  Weight (lbs) 124 lb 6.4 oz 127 lb 127 lb  Weight (kg) 56.427 kg 57.607 kg 57.607 kg      Telemetry    SR - Personally Reviewed  ECG    SR, TWI in lateral leads - Personally Reviewed  Physical Exam   GEN: No acute distress.   Neck: No JVD Cardiac: RRR, no murmurs, rubs, or gallops.  Respiratory: Clear to auscultation bilaterally. GI: Soft, nontender, non-distended  MS: No edema; No deformity. Right groin sheath in  place Neuro:  Nonfocal  Psych: Normal affect   Labs    High Sensitivity Troponin:   Recent Labs  Lab 01/04/20 2323 01/06/20 2350 01/07/20 0044 01/07/20 0619 01/07/20 0945  TROPONINIHS 434* 125* 134* 141* 111*      Chemistry Recent Labs  Lab 01/02/20 0617 01/04/20 2323 01/06/20 2350 01/07/20 0619  NA 137 141 137 142  K 3.7 3.4* 3.5 3.6  CL 107 107 105 110  CO2 17* _1 GLUCOSE 82 134* 106* 89  BUN 15 7* <5* <5*  CREATININE 0.94 0.98 0.88 0.83  CALCIUM 8.8* 9.0 9.2 9.0  PROT 6.6 6.1* 6.8  --   ALBUMIN 3.7 3.4* 3.7  --   AST _2 --   ALT _3 --   ALKPHOS 64 61 62  --   BILITOT 0.9 0.4 0.7  --   GFRNONAA >60 >60 >60 >60  ANIONGAP _4 Hematology Recent Labs  Lab 01/06/20 2350 01/07/20 0619 01/08/20 0424  WBC 7.0 6.0 8.3  RBC 4.16 3.98 3.64*  HGB 13.2 12.5 11.4*  HCT 39.5 37.5 34.3*  MCV 95.0 94.2 94.2  MCH 31.7 31.4 31.3  MCHC 33.4 33.3 33.2  RDW 12.8 12.8 12.7  PLT 190 175 168    BNPNo results for input(s): BNP, PROBNP in the last 168 hours.   DDimer No results for input(s): DDIMER in the last 168 hours.   Radiology    DG Chest Port 1 View  Result Date: 01/06/2020 CLINICAL DATA:  Chest pain EXAM: PORTABLE CHEST 1 VIEW COMPARISON:  01/02/2020 FINDINGS: The lungs are symmetrically mildly hyperinflated. Stable right apical cyst. No pneumothorax or pleural effusion. Coronary artery bypass grafting has been performed. Cardiac size within normal limits. Pulmonary vascularity is normal. No acute bone abnormality. IMPRESSION: No active disease.  COPD. Electronically Signed   By: Fidela Salisbury MD   On: 01/06/2020 23:12    Cardiac Studies   Cath: 01/08/20   There is mild to moderate left ventricular systolic dysfunction.  LV end diastolic pressure is mildly elevated.  The left ventricular ejection fraction is 40-45% by visual estimate. Intravenouses.  Prox LAD to Mid LAD lesion is 100% stenosed. (Beyond grafted site, the  vessel is normal, wraparound-with collaterals to D1)  Normal native dominant LCx and nondominant Right Coronary Arteries.  SVG-D1 graft was visualized by angiography and is small. Prox Graft lesion is 100% stenosed.  SVG-LAD graft was visualized by angiography and is large. The graft exhibits severe focal disease.  CULPRIT LESIONS: SVG-LAD: Prox Graft lesion is 75% stenosed. Mid Graft lesion is 85% stenosed.  A drug-eluting stent was successfully placed covering both lesions, using a STENT RESOLUTE ONYX 3.5X38 -> deployed at 14 ATM-3.6 mm.  Post intervention, there is a 0% residual stenosis.   SUMMARY  Severe single-vessel native disease with 100% occluded LAD after small sternal perforator/prior to D1.  100% occluded SVG-D1  CULPRIT: Tandem 75 and 85% irregular lesions in the prox-midSVG-LAD ? Successful PTCA-DES PCI of SVG-LAD covering both lesions using a resolute Onyx DES 3.5 mm x 38 mm postdilated to 3.6 mm.   ? (Unable to use distal protection-unable to cross with device) -> lesions reduced to 0% with brisk TIMI-3 flow post PCI and extensive collateral flow noted.  Widely patent dominant native LCx with high OM/Ramus as well as nondominant RCA.  Also widely patent LAD beyond the anastomosis (wraparound LAD)  Mildly reduced EF of 40 to 45% with anterior hypokinesis.  Moderately elevated PA with significant systemic hypertension-likely related to full bladder.   RECOMMENDATIONS  Continue antianginal medications along with blood pressure control  Continue to titrate current medications.  See Recommendations section below   Glenetta Hew, MD  Diagnostic Dominance: Left    Intervention      Patient Profile     65 y.o. female with hx of CAD s/p STEMI 1998w/ VF arrest & anoxic brain injury w/ status epilepticus, s/p blow-by w/ BMS LAD 1 mo later. ISR>>CABG 1998 w/ LIMA-LAD & SVG-D1,AMI w/emergent re-operation w/ SVG-LAD 1998, PAF, COPD, GERD, IBS, HTN,  HLDwho is scheduled for outpatient cardiac catheterization 01/10/2020 however presented with recurrent angina.   Admitted with [name] RVR 12/30-12/31->she had upper abdominal pain that resolved with conversion to sinus rhythm. She had troponin elevation of 1309, reduced EF on echocardiogram with regional wall motion abnormality and dynamic ST and T wave changes with T wave inversions on EKG. Plan was for outpatient [facility] as she was very adamant about leaving. Patient declined cardiac catheterization.   Had episode of chest pain January 1 and 2.  Relieved with nitroglycerin requiring to call EMS but declined further evaluation.  Seen by Dr. Ellyn Hack January 3 and felt symptoms due to  unstable angina and recommended cardiac cath.   Assessment & Plan    1.  Unstable angina/non-STEMI with history of CAD: Hs troponin 125>>134 (was peaked to 1309 on 12/30). Underwent cardiac cath noted above with severe single vessel native disease with 100% occluded LAD after small sternal perforator/prior to D1. Culprit lesion felt to be tandem 75 and 85% irregular lesions in the prox-midSVG-LAD. Successful PCI/DES covering both lesions. Plan for DAPT with ASA/plavix/xarelto for one month, then plavix and xarelto.   2. PAF: Maintaining sinus rhythm  -- Xarelto was held with plans for cath. Will plan to resume this evening pending cath site is stable.  -- Continue BB (will not up-tirate due to bradycardia in 50s)  3. HLD: 02/06/2019: Cholesterol, Total 119; HDL 52; LDL Chol Calc (NIH) 46; Triglycerides 119.   4. Cardiomyopathy: Recent echo 12/30 showed LVEF of 45-50% regional wall motion abnormality. Grade 2 diastolic dysfunction. -- Continue Toprol-XL -- Consider adding ACE or ARB/spironolactone post cardiac catheterization  5. HTN:  -- Continue BB. May add ACE/ARB tomorrow pending renal function.  For questions or updates, please contact [place] Please consult [email or web address] for contact  info under   Signed, Reino Bellis, NP  01/08/2020, 10:07 AM    Agree with note by Reino Bellis NP-C  Ms. Rhames just back from the Cath Lab after PCI and stenting of her LAD SVG with a long stent covering 2 sequential lesions.  Her circumflex is dominant.  She has a large ramus branch.  Her right is nondominant.  All these were free of significant disease.  LAD was occluded at the origin.  She does have mild LV dysfunction with grade 2 diastolic dysfunction.  She has a sheath in her groin which is yet to be removed.  She is clinically stable and denies symptoms.  We will restart Xarelto tonight.  She will need to be on "triple therapy" for 30 days after which aspirin can be discontinued.  She is currently in sinus rhythm.  Anticipate discharge tomorrow.  Her exam is benign today.  Lorretta Harp, M.D., [place], [facility], [place] [address] [place], [place]  [address]  [phone] 01/08/2020 11:14 AM

## 2020-01-08 NOTE — Progress Notes (Signed)
   01/07/20 0522  Advance Directives (For Healthcare)  Does Patient Have a Medical Advance Directive? No  Would patient like information on creating a medical advance directive? Yes (Inpatient - patient requests chaplain consult to create a medical advance directive)  Mental Health Advance Directives  Does Patient Have a Mental Health Advance Directive? No  Would patient like information on creating a mental health advance directive? No - Patient declined  Chaplain explained that AD could not be completed because there are no volunteers here today to witness and no notary tomorrow. AD can be completed and witnessed outside the hospital also.

## 2020-01-09 ENCOUNTER — Other Ambulatory Visit (HOSPITAL_COMMUNITY): Payer: Self-pay | Admitting: Cardiology

## 2020-01-09 DIAGNOSIS — I10 Essential (primary) hypertension: Secondary | ICD-10-CM

## 2020-01-09 DIAGNOSIS — I200 Unstable angina: Secondary | ICD-10-CM | POA: Diagnosis not present

## 2020-01-09 DIAGNOSIS — I480 Paroxysmal atrial fibrillation: Secondary | ICD-10-CM

## 2020-01-09 DIAGNOSIS — E785 Hyperlipidemia, unspecified: Secondary | ICD-10-CM | POA: Diagnosis not present

## 2020-01-09 LAB — CBC
HCT: 31.9 % — ABNORMAL LOW (ref 36.0–46.0)
Hemoglobin: 11.1 g/dL — ABNORMAL LOW (ref 12.0–15.0)
MCH: 32.3 pg (ref 26.0–34.0)
MCHC: 34.8 g/dL (ref 30.0–36.0)
MCV: 92.7 fL (ref 80.0–100.0)
Platelets: 190 10*3/uL (ref 150–400)
RBC: 3.44 MIL/uL — ABNORMAL LOW (ref 3.87–5.11)
RDW: 13.2 % (ref 11.5–15.5)
WBC: 9.6 10*3/uL (ref 4.0–10.5)
nRBC: 0 % (ref 0.0–0.2)

## 2020-01-09 LAB — BASIC METABOLIC PANEL WITH GFR
Anion gap: 10 (ref 5–15)
BUN: 6 mg/dL — ABNORMAL LOW (ref 8–23)
CO2: 24 mmol/L (ref 22–32)
Calcium: 8.5 mg/dL — ABNORMAL LOW (ref 8.9–10.3)
Chloride: 105 mmol/L (ref 98–111)
Creatinine, Ser: 0.84 mg/dL (ref 0.44–1.00)
GFR, Estimated: >60 mL/min
Glucose, Bld: 105 mg/dL — ABNORMAL HIGH (ref 70–99)
Potassium: 3.2 mmol/L — ABNORMAL LOW (ref 3.5–5.1)
Sodium: 139 mmol/L (ref 135–145)

## 2020-01-09 MED ORDER — POTASSIUM CHLORIDE CRYS ER 20 MEQ PO TBCR
40.0000 meq | EXTENDED_RELEASE_TABLET | Freq: Once | ORAL | Status: AC
  Administered 2020-01-09: 40 meq via ORAL
  Filled 2020-01-09: qty 2

## 2020-01-09 MED ORDER — CLOPIDOGREL BISULFATE 75 MG PO TABS: 75.0000 mg | ORAL_TABLET | Freq: Every day | ORAL | 1 refills | Status: DC

## 2020-01-09 MED ORDER — ASPIRIN 81 MG PO CHEW: 81.0000 mg | CHEWABLE_TABLET | Freq: Every day | ORAL | 0 refills | Status: DC

## 2020-01-09 MED FILL — CLOPIDOGREL 75 MG TABLET: 75 | 90 days supply | Qty: 90 | Fill #0

## 2020-01-09 MED FILL — ASPIRIN LOW DOSE 81 MG CHEW: 81 | 30 days supply | Qty: 30 | Fill #0

## 2020-01-09 NOTE — Progress Notes (Signed)
CARDIAC REHAB PHASE I   PRE:  Rate/Rhythm: 60 SR    BP: sitting 106/57    SaO2: 99 RA  MODE:  Ambulation: 430 ft   POST:  Rate/Rhythm: 86 SR    BP: sitting 126/74     SaO2: 98 RA  Pt still c/o 5/10 HA, got meds 1 hr ago. Slight decrease with ambulation. Some SOB with ambulation. No CP. Discussed MI, stent, Plavix, restrictions, smoking cessation, diet, exercise, NTG and CRPII. Pt receptive. She is motivated to quit smoking, sts she has not been wanting a cigarette on 14 mg patch. Wants patch for d/c. Understands the importance of Plavix. Will refer to [place] CRPII.  [id number]   Dominique Barber CES, ACSM 01/09/2020 10:23 AM

## 2020-01-09 NOTE — Progress Notes (Signed)
Pt received discharge instructions and does not have any additional questions or concerns. Pt's right groin site is bruised but intact. Pt encouraged to take all her medications as prescribed and folow up on her appointments. Pt is ready for discharge

## 2020-01-09 NOTE — Discharge Summary (Addendum)
Discharge Summary    Patient ID: Dominique Barber MRN: [id number]; DOB: 23-Oct-1955  Admit date: 01/06/2020 Discharge date: 01/09/2020  Primary Care Provider: Nicolette Bang, DO  Primary Cardiologist: Glenetta Hew, MD  Primary Electrophysiologist:  None   Discharge Diagnoses    Principal Problem:   Unstable angina [facility]) Active Problems:   Hyperlipidemia with target LDL less than 70   Paroxysmal atrial fibrillation ([place]); CHA2DS2-VASc Score 3. On Xarelto   Essential hypertension   Coronary artery disease involving coronary bypass graft of native heart with unstable angina pectoris [facility])  Diagnostic Studies/Procedures    Cath: 01/08/20  There is mild to moderate left ventricular systolic dysfunction. LV end diastolic pressure is mildly elevated. The left ventricular ejection fraction is 40-45% by visual estimate. Intravenouses. Prox LAD to Mid LAD lesion is 100% stenosed. (Beyond grafted site, the vessel is normal, wraparound-with collaterals to D1) Normal native dominant LCx and nondominant Right Coronary Arteries. SVG-D1 graft was visualized by angiography and is small. Prox Graft lesion is 100% stenosed. SVG-LAD graft was visualized by angiography and is large. The graft exhibits severe focal disease. CULPRIT LESIONS: SVG-LAD: Prox Graft lesion is 75% stenosed. Mid Graft lesion is 85% stenosed. A drug-eluting stent was successfully placed covering both lesions, using a STENT RESOLUTE ONYX 3.5X38 -> deployed at 14 ATM-3.6 mm. Post intervention, there is a 0% residual stenosis.   SUMMARY Severe single-vessel native disease with 100% occluded LAD after small sternal perforator/prior to D1. 100% occluded SVG-D1 CULPRIT: Tandem 75 and 85% irregular lesions in the prox-midSVG-LAD Successful PTCA-DES PCI of SVG-LAD covering both lesions using a resolute Onyx DES 3.5 mm x 38 mm postdilated to 3.6 mm.   (Unable to use distal protection-unable to cross with device) -> lesions  reduced to 0% with brisk TIMI-3 flow post PCI and extensive collateral flow noted. Widely patent dominant native LCx with high OM/Ramus as well as nondominant RCA.  Also widely patent LAD beyond the anastomosis (wraparound LAD) Mildly reduced EF of 40 to 45% with anterior hypokinesis. Moderately elevated PA with significant systemic hypertension-likely related to full bladder.     RECOMMENDATIONS Continue antianginal medications along with blood pressure control Continue to titrate current medications. See Recommendations section below     Glenetta Hew, MD  Diagnostic Dominance: Left    Intervention     _____________   History of Present Illness     Dominique Barber is a 65 y.o. female with history of atrial fibrillation, prior CABG after STEMI in 5681 complicated by VF arrest and status epilepticus, subsequent AMI w/ re-do SVG--> LAD in 1998, COPD, GERD who presented for evalaution of chest pain.  Dominique Barber is a 65 year old female with risk factors as above who presented the evening of 1/4 to the emergency department for evaluation of chest pain.  She had just been seen in the office for follow-up of a recent hospitalization during which she was in AF w/ RVR and experienced acute coronary syndrome with elevated troponin, dynamic ECG changes, and reduced LVEF.   She presented previously in late December with abdominal pain in the context of AF w/ RVR.  It resolved with conversion to normal sinus rhythm.  Troponin was elevated to 1309 and TTE showed depressed LVEF. She's since contacted EMS thrice for ongoing chest pain (01/04/20, 01/05/20, and again the day of admission).  Following evaluation with Dr. Ellyn Hack earlier that day she had been scheduled for an outpatient angiogram on Friday but this was discussed with  her that given her multiple ED presentations, ongoing positive troponin with persistent symtpoms, she should be admitted for cath sooner.   Hospital Course     1.   Unstable angina/non-STEMI with history of CAD: Hs troponin 125>>134 (was peaked to 1309 on 12/30). Underwent cardiac cath noted above with severe single vessel native disease with 100% occluded LAD after small sternal perforator/prior to D1. Culprit lesion felt to be tandem lesions of 75 and 85% irregular lesions in the prox-midSVG-LAD. Successful PCI/DES covering both lesions. Plan for DAPT with ASA/plavix/xarelto for one month, then plavix and xarelto. Did have mild bleeding at cath site which resolved. Worked well with cardiac rehab without recurrent chest pain.    2. PAF: Maintaining sinus rhythm  -- Xarelto was held with plans for cath. This was resumed post cath.  -- Continue BB (will not up-tirate due to bradycardia in 50s)   3. HLD: 02/06/2019: Cholesterol, Total 119; HDL 52; LDL Chol Calc (NIH) 46; Triglycerides 119.  -- on Crestor 79m daily   4. Cardiomyopathy: Recent echo 12/30 showed LVEF of 45-50% regional wall motion abnormality. Grade 2 diastolic dysfunction. -- Continue Toprol-XL -- Considered adding ACE or ARB/spironolactone post cardiac catheterization but blood pressures were soft.   5. HTN: Stable with BB therapy. Unable to add additional therapy as blood pressures were soft prior to discharge.  6. Hypokalemia: supplemented prior to discharge  Did the patient have an acute coronary syndrome (MI, NSTEMI, STEMI, etc) this admission?:  Yes                               AHA/ACC Clinical Performance & Quality Measures: Aspirin prescribed? - Yes ADP Receptor Inhibitor (Plavix/Clopidogrel, Brilinta/Ticagrelor or Effient/Prasugrel) prescribed (includes medically managed patients)? - Yes Beta Blocker prescribed? - Yes High Intensity Statin (Lipitor 40-858mor Crestor 20-4069mprescribed? - Yes EF assessed during THIS hospitalization? - No - recent echo as an outpatient For EF <40%, was ACEI/ARB prescribed? - Not Applicable (EF >/= 40%56%or EF <40%, Aldosterone Antagonist  (Spironolactone or Eplerenone) prescribed? - Not Applicable (EF >/= 40%21%ardiac Rehab Phase II ordered (including medically managed patients)? - Yes       _____________  Discharge Vitals Blood pressure 122/60, pulse (!) 58, temperature 97.8 F (36.6 C), temperature source Oral, resp. rate 18, height 5' 1" (1.549 m), weight 56.4 kg, SpO2 96 %.  Filed Weights   01/06/20 2235 01/08/20 0336  Weight: 57.6 kg 56.4 kg    Labs & Radiologic Studies    CBC Recent Labs    01/06/20 2350 01/07/20 0619 01/08/20 2106 01/09/20 0158  WBC 7.0   < > 11.1* 9.6  NEUTROABS 3.8  --   --   --   HGB 13.2   < > 11.8* 11.1*  HCT 39.5   < > 34.8* 31.9*  MCV 95.0   < > 92.1 92.7  PLT 190   < > 209 190   < > = values in this interval not displayed.   Basic Metabolic Panel Recent Labs    01/07/20 0619 01/09/20 0158  NA 142 139  K 3.6 3.2*  CL 110 105  CO2 24 24  GLUCOSE 89 105*  BUN <5* 6*  CREATININE 0.83 0.84  CALCIUM 9.0 8.5*   Liver Function Tests Recent Labs    01/06/20 2350  AST 26  ALT 31  ALKPHOS 62  BILITOT 0.7  PROT 6.8  ALBUMIN 3.7  Recent Labs    01/06/20 2350  LIPASE 22   High Sensitivity Troponin:   Recent Labs  Lab 01/04/20 2323 01/06/20 2350 01/07/20 0044 01/07/20 0619 01/07/20 0945  TROPONINIHS 434* 125* 134* 141* 111*    BNP Invalid input(s): POCBNP D-Dimer No results for input(s): DDIMER in the last 72 hours. Hemoglobin A1C No results for input(s): HGBA1C in the last 72 hours. Fasting Lipid Panel Recent Labs    01/07/20 0619  CHOL 96  HDL 43  LDLCALC 36  TRIG 86  CHOLHDL 2.2   Thyroid Function Tests No results for input(s): TSH, T4TOTAL, T3FREE, THYROIDAB in the last 72 hours.  Invalid input(s): FREET3 _____________  CT Abdomen Pelvis Wo Contrast  Result Date: 01/02/2020 CLINICAL DATA:  Nausea and vomiting, epigastric pain EXAM: CT ABDOMEN AND PELVIS WITHOUT CONTRAST TECHNIQUE: Multidetector CT imaging of the abdomen and pelvis was  performed following the standard protocol without IV contrast. COMPARISON:  Most recent imaging is from 2006 FINDINGS: Lower chest: No acute abnormality. Hepatobiliary: Too small to characterize low-attenuation lesion of the right hepatic lobe may reflect a small cyst. Cholecystectomy. No biliary dilatation. Pancreas: Unremarkable. Spleen: Unremarkable. Adrenals/Urinary Tract: Adrenals are unremarkable. Exophytic cyst of the interpolar region of the left kidney. Bladder is moderately distended and unremarkable. Stomach/Bowel: Stomach is within normal limits. Bowel is normal in caliber. Vascular/Lymphatic: Aortic atherosclerosis. No enlarged lymph nodes identified. Reproductive: Small uterine calcification at this site of fibroid on the prior study. No adnexal mass. Other: No ascites.  Abdominal wall is unremarkable. Musculoskeletal: No acute osseous abnormality. IMPRESSION: No acute abnormality or findings to account for reported symptoms. Electronically Signed   By: Macy Mis M.D.   On: 01/02/2020 09:31   CARDIAC CATHETERIZATION  Result Date: 01/08/2020  There is mild to moderate left ventricular systolic dysfunction.  LV end diastolic pressure is mildly elevated.  The left ventricular ejection fraction is 40-45% by visual estimate. Intravenouses.  Prox LAD to Mid LAD lesion is 100% stenosed. (Beyond grafted site, the vessel is normal, wraparound-with collaterals to D1)  Normal native dominant LCx and nondominant Right Coronary Arteries.  SVG-D1 graft was visualized by angiography and is small. Prox Graft lesion is 100% stenosed.  SVG-LAD graft was visualized by angiography and is large. The graft exhibits severe focal disease.  CULPRIT LESIONS: SVG-LAD: Prox Graft lesion is 75% stenosed. Mid Graft lesion is 85% stenosed.  A drug-eluting stent was successfully placed covering both lesions, using a STENT RESOLUTE ONYX 3.5X38 -> deployed at 14 ATM-3.6 mm.  Post intervention, there is a 0% residual  stenosis.  SUMMARY  Severe single-vessel native disease with 100% occluded LAD after small sternal perforator/prior to D1.  100% occluded SVG-D1  CULPRIT: Tandem 75 and 85% irregular lesions in the prox-midSVG-LAD  Successful PTCA-DES PCI of SVG-LAD covering both lesions using a resolute Onyx DES 3.5 mm x 38 mm postdilated to 3.6 mm.   (Unable to use distal protection-unable to cross with device) -> lesions reduced to 0% with brisk TIMI-3 flow post PCI and extensive collateral flow noted.  Widely patent dominant native LCx with high OM/Ramus as well as nondominant RCA.  Also widely patent LAD beyond the anastomosis (wraparound LAD)  Mildly reduced EF of 40 to 45% with anterior hypokinesis.  Moderately elevated PA with significant systemic hypertension-likely related to full bladder. RECOMMENDATIONS  Continue antianginal medications along with blood pressure control  Continue to titrate current medications.  See Recommendations section below Glenetta Hew, MD  DG Chest [place] 1  View  Result Date: 01/06/2020 CLINICAL DATA:  Chest pain EXAM: PORTABLE CHEST 1 VIEW COMPARISON:  01/02/2020 FINDINGS: The lungs are symmetrically mildly hyperinflated. Stable right apical cyst. No pneumothorax or pleural effusion. Coronary artery bypass grafting has been performed. Cardiac size within normal limits. Pulmonary vascularity is normal. No acute bone abnormality. IMPRESSION: No active disease.  COPD. Electronically Signed   By: Fidela Salisbury MD   On: 01/06/2020 23:12   DG Chest [place] 1 View  Result Date: 01/02/2020 CLINICAL DATA:  65 year old female with chest pain and abdominal bloating. EXAM: PORTABLE CHEST 1 VIEW COMPARISON:  Portable chest 05/04/2014 and earlier. FINDINGS: Portable AP upright view at 0657 hours. Chronic large lung volumes. Prior CABG. Normal cardiac size and mediastinal contours. Visualized tracheal air column is within normal limits. Allowing for portable technique the lungs are clear. No  pneumothorax. No acute osseous abnormality identified. IMPRESSION: Chronic pulmonary hyperinflation and prior CABG. No acute cardiopulmonary abnormality. Electronically Signed   By: Genevie Ann M.D.   On: 01/02/2020 07:06   ECHOCARDIOGRAM COMPLETE  Result Date: 01/02/2020    ECHOCARDIOGRAM REPORT   Patient Name:   MITRA DULING Date of Exam: 01/02/2020 Medical Rec #:  [id number]          Height:       61.0 in Accession #:    [id number]         Weight:       132.9 lb Date of Birth:  1955-12-19           BSA:          1.588 m Patient Age:    33 years           BP:           106/91 mmHg Patient Gender: F                  HR:           61 bpm. Exam Location:  Inpatient Procedure: 2D Echo, Cardiac Doppler and Color Doppler Indications:    Elevated Troponin  History:        Patient has prior history of Echocardiogram examinations, most                 recent 07/03/2014. CAD, Prior CABG, COPD, Arrythmias:Atrial                 Fibrillation and STEMI; Risk Factors:Hypertension, Dyslipidemia                 and Current Smoker. GERD.  Sonographer:    Vickie Epley RDCS Referring Phys: [address] [place]  1. Left ventricular ejection fraction, by estimation, is 45 to 50%. The left ventricle has mildly decreased function. The left ventricle demonstrates regional wall motion abnormalities (see scoring diagram/findings for description). Left ventricular diastolic parameters are consistent with Grade II diastolic dysfunction (pseudonormalization).  2. Right ventricular systolic function is normal. The right ventricular size is normal.  3. The mitral valve is normal in structure. Trivial mitral valve regurgitation. No evidence of mitral stenosis.  4. The aortic valve is normal in structure. Aortic valve regurgitation is not visualized. No aortic stenosis is present.  5. The inferior vena cava is normal in size with greater than 50% respiratory variability, suggesting right atrial pressure of 3 mmHg. FINDINGS  Left  Ventricle: Left ventricular ejection fraction, by estimation, is 45 to 50%. The left ventricle has mildly decreased function. The left ventricle demonstrates regional  wall motion abnormalities. The left ventricular internal cavity size was normal in size. There is no left ventricular hypertrophy. Left ventricular diastolic parameters are consistent with Grade II diastolic dysfunction (pseudonormalization).  LV Wall Scoring: The apical septal segment, apical inferior segment, and apex are akinetic. Right Ventricle: The right ventricular size is normal. No increase in right ventricular wall thickness. Right ventricular systolic function is normal. Left Atrium: Left atrial size was normal in size. Right Atrium: Right atrial size was normal in size. Pericardium: There is no evidence of pericardial effusion. Mitral Valve: The mitral valve is normal in structure. Trivial mitral valve regurgitation. No evidence of mitral valve stenosis. Tricuspid Valve: The tricuspid valve is normal in structure. Tricuspid valve regurgitation is not demonstrated. No evidence of tricuspid stenosis. Aortic Valve: The aortic valve is normal in structure. Aortic valve regurgitation is not visualized. No aortic stenosis is present. Pulmonic Valve: The pulmonic valve was normal in structure. Pulmonic valve regurgitation is not visualized. No evidence of pulmonic stenosis. Aorta: The aortic root is normal in size and structure. Venous: The inferior vena cava is normal in size with greater than 50% respiratory variability, suggesting right atrial pressure of 3 mmHg. IAS/Shunts: No atrial level shunt detected by color flow Doppler.  LEFT VENTRICLE PLAX 2D LVIDd:         4.10 cm     Diastology LVIDs:         3.20 cm     LV e' medial:    5.35 cm/s LV PW:         0.80 cm     LV E/e' medial:  13.7 LV IVS:        0.80 cm     LV e' lateral:   5.59 cm/s LVOT diam:     1.70 cm     LV E/e' lateral: 13.1 LV SV:         40 LV SV Index:   25 LVOT Area:      2.27 cm  LV Volumes (MOD) LV vol d, MOD A2C: 66.4 ml LV vol d, MOD A4C: 69.0 ml LV vol s, MOD A2C: 37.4 ml LV vol s, MOD A4C: 37.2 ml LV SV MOD A2C:     29.0 ml LV SV MOD A4C:     69.0 ml LV SV MOD BP:      31.4 ml RIGHT VENTRICLE RV S prime:     6.89 cm/s TAPSE (M-mode): 1.4 cm LEFT ATRIUM             Index       RIGHT ATRIUM          Index LA diam:        3.00 cm 1.89 cm/m  RA Area:     8.35 cm LA Vol (A2C):   23.7 ml 14.93 ml/m RA Volume:   14.60 ml 9.20 ml/m LA Vol (A4C):   22.6 ml 14.23 ml/m LA Biplane Vol: 25.6 ml 16.12 ml/m  AORTIC VALVE LVOT Vmax:   86.80 cm/s LVOT Vmean:  54.000 cm/s LVOT VTI:    0.178 m  AORTA Ao Root diam: 2.50 cm MITRAL VALVE MV Area (PHT): 4.21 cm    SHUNTS MV Decel Time: 180 msec    Systemic VTI:  0.18 m MV E velocity: 73.30 cm/s  Systemic Diam: 1.70 cm MV A velocity: 34.30 cm/s MV E/A ratio:  2.14 Candee Furbish MD Electronically signed by Candee Furbish MD Signature Date/Time: 01/02/2020/3:00:42 PM    Final  Disposition   Pt is being discharged home today in good condition.  Follow-up Plans & Appointments     Follow-up Information     Darreld Mclean, PA-C Follow up on 01/21/2020.   Specialties: Physician Assistant, Cardiology Why: at 3:45pm for your follow up appt. Contact information: [address] [place] [address] [phone]                Discharge Instructions     Diet - low sodium heart healthy   Complete by: As directed    Discharge instructions   Complete by: As directed    Groin Site Care Refer to this sheet in the next few weeks. These instructions provide you with information on caring for yourself after your procedure. Your caregiver may also give you more specific instructions. Your treatment has been planned according to current medical practices, but problems sometimes occur. Call your caregiver if you have any problems or questions after your procedure. HOME CARE INSTRUCTIONS You may shower 24 hours after  the procedure. Remove the bandage (dressing) and gently wash the site with plain soap and water. Gently pat the site dry.  Do not apply powder or lotion to the site.  Do not sit in a bathtub, swimming pool, or whirlpool for 5 to 7 days.  No bending, squatting, or lifting anything over 10 pounds (4.5 kg) as directed by your caregiver.  Inspect the site at least twice daily.  Do not drive home if you are discharged the same day of the procedure. Have someone else drive you.  You may drive 24 hours after the procedure unless otherwise instructed by your caregiver.  What to expect: Any bruising will usually fade within 1 to 2 weeks.  Blood that collects in the tissue (hematoma) may be painful to the touch. It should usually decrease in size and tenderness within 1 to 2 weeks.  SEEK IMMEDIATE MEDICAL CARE IF: You have unusual pain at the groin site or down the affected leg.  You have redness, warmth, swelling, or pain at the groin site.  You have drainage (other than a small amount of blood on the dressing).  You have chills.  You have a fever or persistent symptoms for more than 72 hours.  You have a fever and your symptoms suddenly get worse.  Your leg becomes pale, cool, tingly, or numb.  You have heavy bleeding from the site. Hold pressure on the site. Marland Kitchen  PLEASE DO NOT MISS ANY DOSES OF YOUR PLAVIX!!!!! Also keep a log of you blood pressures and bring back to your follow up appt. Please call the office with any questions.   Patients taking blood thinners should generally stay away from medicines like ibuprofen, Advil, Motrin, naproxen, and Aleve due to risk of stomach bleeding. You may take Tylenol as directed or talk to your primary doctor about alternatives.  Some studies suggest Prilosec/Omeprazole interacts with Plavix. We changed your Prilosec/Omeprazole to the equivalent dose of Protonix for less chance of interaction.  PLEASE ENSURE THAT YOU DO NOT RUN OUT OF YOUR PLAVIX. This  medication is very important to remain on for at least one year. IF you have issues obtaining this medication due to cost please CALL the office 3-5 business days prior to running out in order to prevent missing doses of this medication.   Increase activity slowly   Complete by: As directed        Discharge Medications    Allergies as of 01/09/2020  Reactions   Atorvastatin Other (See Comments)   Gas pain in abdomen with no relief   Eluxadoline Nausea And Vomiting, Other (See Comments)   Viberzi- And, made the patient feel "high"   Iohexol Hives, Rash   Red dye        Medication List     STOP taking these medications    isosorbide mononitrate 30 MG 24 hr tablet Commonly known as: IMDUR   predniSONE 50 MG tablet Commonly known as: DELTASONE       TAKE these medications    acetaminophen 500 MG tablet Commonly known as: TYLENOL Take 500 mg by mouth every 6 (six) hours as needed for mild pain.   alum & mag hydroxide-simeth 200-200-20 MG/5ML suspension Commonly known as: MAALOX/MYLANTA Take 15-30 mLs by mouth every 6 (six) hours as needed for indigestion or flatulence.   aspirin 81 MG chewable tablet Chew 1 tablet (81 mg total) by mouth daily. Start taking on: January 10, 2020   CALCIUM+D3 PO Take 1 tablet by mouth daily with breakfast.   cetirizine 10 MG tablet Commonly known as: ZYRTEC TAKE 1 TABLET DAILY   clopidogrel 75 MG tablet Commonly known as: PLAVIX Take 1 tablet (75 mg total) by mouth daily with breakfast. Start taking on: January 10, 2020   levothyroxine 50 MCG tablet Commonly known as: SYNTHROID Take 1.5 tablets (75 mcg total) by mouth daily before breakfast. TAKE ONE AND ONE-HALF TABLETS DAILY BEFORE BREAKFAST   metoprolol succinate 50 MG 24 hr tablet Commonly known as: TOPROL-XL Take 1 tablet (50 mg total) by mouth 2 (two) times daily. Take with or immediately following a meal.   nicotine 14 mg/24hr patch Commonly known as: NICODERM  CQ - dosed in mg/24 hours Place 1 patch (14 mg total) onto the skin daily.   nitroGLYCERIN 0.4 MG SL tablet Commonly known as: NITROSTAT Place 1 tablet (0.4 mg total) under the tongue every 5 (five) minutes x 3 doses as needed for chest pain.   NON FORMULARY Take 1 capsule by mouth See admin instructions. Renew Life Women's Probiotics- Take 1 capsule by mouth once a day   NON FORMULARY Take 1 capsule by mouth See admin instructions. NOW Supplements/Peppermint Gels with Ginger & Fennel Oils, Enteric Coated, Digestive Support- Take 1 capsule by mouth before each meal   potassium chloride 10 MEQ tablet Commonly known as: KLOR-CON Take 1 tablet (10 mEq total) by mouth at bedtime.   rivaroxaban 20 MG Tabs tablet Commonly known as: Xarelto Take 1 tablet (20 mg total) by mouth daily with supper. What changed: when to take this   rosuvastatin 20 MG tablet Commonly known as: CRESTOR Take 1 tablet (20 mg total) by mouth daily.   Simethicone 125 MG Tabs Chew 125 mg by mouth See admin instructions. Chew 125 mg by mouth three times a day after meals as needed for gas   Symax-SR 0.375 MG 12 hr tablet Generic drug: hyoscyamine Take 0.375 mg by mouth 2 (two) times daily.   hyoscyamine 0.125 MG tablet Commonly known as: LEVSIN Take 1 tablet (0.125 mg total) by mouth 2 (two) times daily.          Outstanding Labs/Studies     Duration of Discharge Encounter   Greater than 30 minutes including physician time.  Signed, Reino Bellis, NP 01/09/2020, 9:41 AM   Agree with note by Reino Bellis NP-C  Ms. Calvert underwent PCI and drug-eluting stenting of proximal SVG to the mid LAD by Dr. Ellyn Hack yesterday.  She had an excellent angiographic result.  Has had no recurrent chest pain.  She was placed back on her Xarelto, baby aspirin and Plavix all of which she will be on for total of 30 days after which aspirin can be discontinued.  She is complaining of a headache this morning  which may be related to the low-dose Imdur which we started.  This can be discontinued.  Exam is benign.  We will arrange TOC 7 and subsequent office visit with Dr. Ellyn Hack in 4 to 6 weeks.  She stable for discharge this morning.  Lorretta Harp, M.D., [place], [facility], [place] [address] [place], [place]  [address]  [phone] 01/09/2020 9:41 AM

## 2020-01-09 NOTE — Progress Notes (Signed)
Pt c/o HA, declines ambulation right now. Sts she notified RN earlier, I couldn't find RN at this time. Will try again. Left ed materials. Ethelda Chick CES, ACSM 8:15 AM 01/09/2020

## 2020-01-09 NOTE — Consult Note (Signed)
   [facility] Inpatient Consult   01/09/2020  Dominique Barber 1955-05-09 [id number]   [facility] [[facility]]  Accountable Care Organization [ACO] Patient:  Medicare   Patient screened for unplanned readmission in less than 7 days hospitalization.  Called to patient's room to check if potential [facility]  [[facility]] Care Management service needs.  There was no answer to the hospital room. Brief review of medical record of MD progress notes and with review of Care Coordination discharge planning tab does not indicate apparent barriers for post hospital care.  Primary Care Provider is Arvilla Market, DO Primary Care at [facility] this provider is listed to provide the transition of care [TOC] for post hospital follow up.   Plan: Patient will be followed by General Discharge EMMI as assigned by the system. No current [facility] for Care Management needs indicated.  For questions contact:   Charlesetta Shanks, RN BSN CCM [facility]  [phone] business mobile phone Toll free office [phone]  Fax number: [phone] [email or web address]

## 2020-01-10 ENCOUNTER — Telehealth: Payer: Self-pay

## 2020-01-10 NOTE — Telephone Encounter (Signed)
Transition Care Management Follow-up Telephone Call  Date of discharge and from where: 01/09/2020, [facility]   How have you been since you were released from the hospital? She said she is doing all well as can be expected.   Any questions or concerns? No  Items Reviewed:  Did the pt receive and understand the discharge instructions provided? Yes   Medications obtained and verified? Yes  - she has all medications including the new ones - aspirin and plavix. It is noted on her AVS to stop imdur and prednisone but she said she never took these.   Other? No   Any new allergies since your discharge? No   Do you have support at home? Yes , her husband  [address] and Equipment/Supplies: Were home health services ordered? no If so, what is the name of the agency? n/a  Has the agency set up a time to come to the patient's home?  n/a Were any new equipment or medical supplies ordered?  No What is the name of the medical supply agency? n/a Were you able to get the supplies/equipment? n/a Do you have any questions related to the use of the equipment or supplies? No, n/a  She has an oximeter and home BP monitor. This morning : BP: 114/68; P: 63; O2 97%  Functional Questionnaire: (I = Independent and D = Dependent) ADLs: independent  Follow up appointments reviewed:   PCP Hospital f/u appt confirmed? No she said she will call to schedule an appointment after seeing the cardiologist   [facility] f/u appt confirmed? Yes , cardiology - 01/21/2020.  Are transportation arrangements needed? No   If their condition worsens, is the pt aware to call PCP or go to the Emergency Dept.? yes  Was the patient provided with contact information for the PCP's office or ED?  She has the phone number for [facility]  Was to pt encouraged to call back with questions or concerns? yes

## 2020-01-13 ENCOUNTER — Other Ambulatory Visit: Payer: Self-pay

## 2020-01-13 MED ORDER — METOPROLOL SUCCINATE ER 50 MG PO TB24: 50.0000 mg | ORAL_TABLET | Freq: Two times a day (BID) | ORAL | 3 refills | Status: DC

## 2020-01-14 ENCOUNTER — Telehealth (HOSPITAL_COMMUNITY): Payer: Self-pay

## 2020-01-14 NOTE — Telephone Encounter (Signed)
Pt insurance is active and benefits verified through Medicare A/B. Co-pay $0.00, DED $233/$0.00 met, out of pocket $0.00/$0.00 met, co-insurance 0%. No pre-authorization required. Passport, 01/14/20 @ 11:10AM, [id number]  Will contact patient to see if she is interested in the Cardiac Rehab Program. If interested, patient will need to complete follow up appt. Once completed, patient will be contacted for scheduling upon review by the RN Navigator.

## 2020-01-14 NOTE — Progress Notes (Deleted)
Cardiology Office Note:    Date:  01/14/2020   ID:  Dominique Barber, DOB 1955/09/23, MRN [id number]  PCP:  Nicolette Bang, DO  Cardiologist:  Glenetta Hew, MD  Electrophysiologist:  None   Referring MD: Caryl Never*   Chief Complaint: follow-up for chest pain and recent PCI  History of Present Illness:    Dominique Barber is a 65 y.o. female with a history of CAD with remote STEMI and VF arrest in 1998 s/p CABG x2 (LIMA-LAD and SVG-D2) and then redo CABG x1 (SVG-mid LAD) shortly after due to occluded LIMA graft and now recent PCI/DES to SVG-LAD graft on 01/08/2020, paroxymal atrial fibrillation on Xarelto, COPD, IBS, GERD, hypertension, hyperlipidemia who is followed by Dr. Ellyn Hack and presents today for follow-up of recent PCI.  Patient has a long history of CAD. She had a anterior STEMI in 1998 at age 41. This was complicated by VF arrest with cardiac shock and mild anoxic brain injury. She underwent emergent PTCA of LAD with BMS. Shortly after she underwent CABG x2 with LIMA to LAD and SVG to D2 for in-stent restenosis. Post-op was complicated by acute occlusion of LIMA graft resulting in recurrent anterior MI requiring redo CABG with SVG to mid LAD. Cardiac cath in 01/2003 showed patient SVG to D1 and SVG to mid LAD grafts. LIMA to LAD was atretic and occluded. Myoview in 2016 showed small mid anterior wall scar without ischemia.   Patient  was seen in an Urgent Care on 12/31/2019 with upper abdomdinal pain not relieved with bowel movement. There was concern for possible bowel obstruction. Patient was advised to go to the ED but it does not look like she did. She was then admitted from 01/02/2020 to 01/03/2020 for atrial fibrillation with RVR (rates as high as the 140's). She spontaneously converted to sinus rhythm in the ED. However, high-sensitivity troponin was elevated so she was admitted for observation. Troponin peaked at 1309. EKG did showed T wave inversion in  anterolateral leads. Echo showed LVEF of 45-50% with akinesis of apical septal segment, apical inferior segment, and apex as well as grade 2 diastolic dysfunction. Patient was discharged with plan for outpatient Lexiscan. Patient was transported to the ED via EMS on 01/04/2020 for further evaluation of chest pain. Chest pain relieved with Nitro and she decided to go home as opposed to waiting in the ED for multiple hours. EMS was contacted again on 02/05/2020 for recurrent chest pain and abdominal pain. Chest pain again relieved with Ntiro. EMS EKG reportedly showed resolution of lateral T wave inversions.   Patient was seen by Dr. Ellyn Hack on 01/06/2020 and reported feeling very poorly epigastric tightness and bloating. Epigastric tightness radiated up to chest at times. Chest pain would resolve with Nitro. She was started on Imdur and decision made to proceed with outpatient cardiac catheterization rather than stress test. High-sensitivity troponin was checked and was elevated at 125. Given persistent symptoms and positive troponin, decision was then made that she should be admitted for cath sooner. Patient was admitted from 01/06/2020 to 01/09/2020 for unstable. Angina. Cardiac cath on 01/08/2020 showed severe single vessel native disease with 100% occluded LAD after small sternal perforator/prior to D1. SVG to D1 occluded. Culprit lesion fel to be tandem 75% and 85% lesions in proximal to mid SVG to LAD graft. Patient underwent successful PTCA and DES to this lesion. Plan was for triple therapy with Aspirin, Plavix, and Xarelto for 1 month at which time Aspirin can  be discontinued. Imdur was discontinued due to headache.  Patient presents today for follow-up. ***  CAD s/p Recent PCI History of Remote CABG in 1998 - History of remote STEMI and VF arrest in 1998 s/p CABG x2 (LIMA-LAD and SVG-D2) and then redo CABG x1 (SVG-mid LAD) shortly after due to occluded LIMA graft.  - LHC on 01/08/2020 showed severe single vessel  native disease with 100% occluded LAD after small sternal perforator/prior to D1. SVG to D1 occluded. Culprit lesion fel to be tandem 75% and 85% lesions in proximal to mid SVG to LAD graft. Patient underwent successful PTCA and DES to this lesion. - *** - Continue triple therapy with Aspirin 110m daily, Plavix 761mdaily, and Xarelto 207maily for 1 month at which time Aspirin can be stopped. Plavix should be continued for a minimum of 12 months. - Continue Toprol-XL 41m23mily and Crestor 20mg28mly. - Will check CBC to ensure hemoglobin stable on triple therapy. ***  Paroxysmal Atrial Fibrillation  - Maintaining sinus rhythm. *** - Continue Toprol-Xl 25mg 74my.  - Continue Xarelto 20mg d28m.   Ischemic Cardiomyopathy - Echo in 12/2019 showed LVEF of 45-50% with akinesis of apical septal segment, apical inferior segment, and apex as well as grade 2 diastolic dysfunction. - Appears euvolemic on exam. - Continue Toprol-XL as above. - ARB *** -   Hypertension - *** - Continue Toprol-XL as above.  Hyperlipidemia - Recent lipid panel on 01/07/2020: Total Cholesterol 96, Triglycerides 86, HDL 43, LDL 36.  - Continue Cresto 20mg da48m   Hypokalemia - Patient was mildly hypokalemic during recent admission and was supplemented with K-Dur. - Will recheck BMET today. ***  Past Medical History:  Diagnosis Date  . CAD in native artery 1998   Followup 1 month post MI revealed 95% ISR in PTCA site --> PCI with 3.0 mm x 25 mm BMS stent in proximal LAD --> 5 months later recurrent ISR of LAD BMS that compromised D1 --> referred for CABG .--> Emergent redo single vessel CABG with SVG-mLAD for occluded LIMA  . COPD (chronic obstructive pulmonary disease) (HCC)   .[place] 01/03[place]  . GERD (gastroesophageal reflux disease)   . Hyperlipidemia   . Hypothyroidism    On Synthroid  . Iron deficiency anemia   . Irritable bowel syndrome    Followed by Dr. Jyothi MJuanita Craver dysfunction 01/03/2020  . PAF (paroxysmal atrial fibrillation) (HCC) [id number]   New onset - originally with RVR  . S/P CABG x 2 07/1996   Initially LIMA-LAD, SVG-D1 --> immediate LIMA failure post CABG with anterior MI --> urgent redo SVG-LAD beyond D2.; Widely patent grafts as of 2005 with left dominant system. Graft to the diagonal branch has a very small target vessel but the vein graft to LAD retrograde fills a large bifurcating D2 and antegrade fills a large D3.  . ST elevation (STEMI) myocardial infarction involving left anterior descending coronary artery (HCC) 01/[id number]/7169icated by VF arrest, and anoxic brain injury with status epilepticus; POBA of LAD --> 6 months later, after 2 vessel CABG she had postop complication with occluded LIMA/second anterior STEMI  . Tobacco abuse     Past Surgical History:  Procedure Laterality Date  . APPENDECTOMY    . BREAST BIOPSY Right   . CARDIAC CATHETERIZATION  07/30/1996   normal L main, LAD w/95% stenosis just before stent, diffuse 80-85% end-stent restenosis, 1st diagonal with70-75% ostial stenosis, large optional diagonal that was normal,  Cfx with 2 marginals and distal PLA (all normal), RCA normal (Dr. Marella Chimes)  . CARDIAC CATHETERIZATION  01/31/2003   LAD totally occluded in prox 3rd, patent Cfx, SVG to mid LAd patent, SVG to small DX1 patent, LIMA to LAD was atretic and essentially occluded in junction of prox & mid 3rd, R barciocephalic and subclavian were normal (Dr. Marella Chimes)  . CESAREAN SECTION    . CHOLECYSTECTOMY    . CORONARY ANGIOPLASTY  01/19/1996   acute anterior wall MI, anoxic encephalopahty, VF; LCfx free of disease and dominant, RCA patent, L main short and patent, LAD with 70-80% narrowing and focal 95% stenosis in distal 3rd with balloon angioplasty (Dr. Marella Chimes)  . CORONARY ANGIOPLASTY WITH STENT PLACEMENT  02/26/1996   3.0x34m Multi-Link stent to prox/mid LAD; L main normal & short, Cfx dominant and normal,  PDA & PLA normal, RCA non-dominant with small PLA and normal (Dr. RMarella Chimes  . CORONARY ARTERY BYPASS GRAFT  07/31/1996   x2; LIMA to LAD and SVG to 1st diagonal (Dr. CRemus Loffler  . CORONARY ARTERY BYPASS GRAFT  07/31/1996   x1; SVG to distal LAD (Dr. CRemus Loffler  . CORONARY STENT INTERVENTION N/A 01/08/2020   Procedure: CORONARY STENT INTERVENTION;  Surgeon: HLeonie Man MD;  [facility];  Service: Cardiovascular;  Laterality: N/A;  . LEFT HEART CATH AND CORS/GRAFTS ANGIOGRAPHY N/A 01/08/2020   Procedure: LEFT HEART CATH AND CORS/GRAFTS ANGIOGRAPHY;  Surgeon: HLeonie Man MD;  [facility];  Service: Cardiovascular;  Laterality: N/A;  . NM MYOCAR PERF WALL MOTION  10/2010; 06/2014   a) LexiScan Cardiolite - mild perfusion defect r/t infarct/scar with mild periifarct ischemia in mid anterior & apical anterior region; EF 67%; abnormal, low risk study;; b) Small, moderate intensity Fixed perfusion defect - mid Anterior Wall c/w known Anterior MI. LOW RISK   . OVARIAN CYST REMOVAL    . TRANSTHORACIC ECHOCARDIOGRAM  02/2012; 06/2014   a) EF 50-55%, mild HK of anterospetal myocardium; mild MR;; b) EF 55-60%, mild HK of apical anterior wall.   Mild MR    Current Medications: No outpatient medications have been marked as taking for the 01/21/20 encounter (Appointment) with GDarreld Mclean PA-C.     Allergies:   Atorvastatin, Eluxadoline, and Iohexol   Social History   Socioeconomic History  . Marital status: Married    Spouse name: Not on file  . Number of children: 3  . Years of education: Not on file  . Highest education level: Not on file  Occupational History    Employer: DISABLED  Tobacco Use  . Smoking status: Current Some Day Smoker    Packs/day: 0.25    Types: Cigarettes  . Smokeless tobacco: Former [employer] developer . Tobacco comment: Quit at time of MI, restarted in 2009  Substance and Sexual Activity  . Alcohol use: No    Alcohol/week: 0.0 standard  drinks  . Drug use: Not on file  . Sexual activity: Not on file  Other Topics Concern  . Not on file  Social History Narrative   Married, mother of 3 children (2 living sons age 76-26 and 30-31; her daughter was the victim of a murder that took place sometime around the time of the patient's MI. She was under significant amount of stress as her daughter had gone missing. She was never found. Finally the suspect was charged and convicted in 2009. She had sessile he stop smoking until that  time frame when it brought back memories and she is now back to smoking a half pack a day.   She does not get routine exercise, but is active around the house and doing housecleaning chores.   She does not drink alcohol.   Social Determinants of Health   Financial Resource Strain: Not on file  Food Insecurity: Not on file  Transportation Needs: Not on file  Physical Activity: Not on file  Stress: Not on file  Social Connections: Not on file     Family History: The patient's ***family history includes Breast cancer in her mother; Diabetes in her mother and another family member; Heart failure in an other family member; Thyroid disease in her maternal grandmother and paternal aunt.  ROS:   Please see the history of present illness.    *** All other systems reviewed and are negative.  EKGs/Labs/Other Studies Reviewed:    The following studies were reviewed today:  Echocardiogram 01/02/2020: Impressions: 1. Left ventricular ejection fraction, by estimation, is 45 to 50%. The  left ventricle has mildly decreased function. The left ventricle  demonstrates regional wall motion abnormalities (see scoring  diagram/findings for description). Left ventricular  diastolic parameters are consistent with Grade II diastolic dysfunction  (pseudonormalization).  2. Right ventricular systolic function is normal. The right ventricular  size is normal.  3. The mitral valve is normal in structure. Trivial mitral  valve  regurgitation. No evidence of mitral stenosis.  4. The aortic valve is normal in structure. Aortic valve regurgitation is  not visualized. No aortic stenosis is present.  5. The inferior vena cava is normal in size with greater than 50%  respiratory variability, suggesting right atrial pressure of 3 mmHg.  _______________  Cardiac Catheterization 01/08/2020:  There is mild to moderate left ventricular systolic dysfunction.  LV end diastolic pressure is mildly elevated.  The left ventricular ejection fraction is 40-45% by visual estimate. Intravenouses.  Prox LAD to Mid LAD lesion is 100% stenosed. (Beyond grafted site, the vessel is normal, wraparound-with collaterals to D1)  Normal native dominant LCx and nondominant Right Coronary Arteries.  SVG-D1 graft was visualized by angiography and is small. Prox Graft lesion is 100% stenosed.  SVG-LAD graft was visualized by angiography and is large. The graft exhibits severe focal disease.  CULPRIT LESIONS: SVG-LAD: Prox Graft lesion is 75% stenosed. Mid Graft lesion is 85% stenosed.  A drug-eluting stent was successfully placed covering both lesions, using a STENT RESOLUTE ONYX 3.5X38 -> deployed at 14 ATM-3.6 mm.  Post intervention, there is a 0% residual stenosis.   Summary  Severe single-vessel native disease with 100% occluded LAD after small sternal perforator/prior to D1.  100% occluded SVG-D1  CULPRIT: Tandem 75 and 85% irregular lesions in the prox-midSVG-LAD ? Successful PTCA-DES PCI of SVG-LAD covering both lesions using a resolute Onyx DES 3.5 mm x 38 mm postdilated to 3.6 mm.   ? (Unable to use distal protection-unable to cross with device) -> lesions reduced to 0% with brisk TIMI-3 flow post PCI and extensive collateral flow noted.  Widely patent dominant native LCx with high OM/Ramus as well as nondominant RCA.  Also widely patent LAD beyond the anastomosis (wraparound LAD)  Mildly reduced EF of 40 to 45% with  anterior hypokinesis.  Moderately elevated PA with significant systemic hypertension-likely related to full bladder.  Recommendation  Continue antianginal medications along with blood pressure control  Continue to titrate current medications.  See Recommendations section below  EKG:  EKG ordered today.  EKG personally reviewed and demonstrates ***.  Recent Labs: 01/02/2020: Magnesium 1.8; TSH 1.139 01/06/2020: ALT 31 01/09/2020: BUN 6; Creatinine, Ser 0.84; Hemoglobin 11.1; Platelets 190; Potassium 3.2; Sodium 139  Recent Lipid Panel    Component Value Date/Time   CHOL 96 01/07/2020 0619   CHOL 119 02/06/2019 1531   TRIG 86 01/07/2020 0619   HDL 43 01/07/2020 0619   HDL 52 02/06/2019 1531   CHOLHDL 2.2 01/07/2020 0619   VLDL 17 01/07/2020 0619   LDLCALC 36 01/07/2020 0619   LDLCALC 46 02/06/2019 1531    Physical Exam:    Vital Signs: There were no vitals taken for this visit.    Wt Readings from Last 3 Encounters:  01/08/20 124 lb 6.4 oz (56.4 kg)  01/06/20 127 lb (57.6 kg)  01/03/20 123 lb 9.6 oz (56.1 kg)     General: 65 y.o. female in no acute distress. HEENT: Normocephalic and atraumatic. Sclera clear. EOMs intact. Neck: Supple. No carotid bruits. No JVD. Heart: *** RRR. Distinct S1 and S2. No murmurs, gallops, or rubs. Radial and distal pedal pulses 2+ and equal bilaterally. Lungs: No increased work of breathing. Clear to ausculation bilaterally. No wheezes, rhonchi, or rales.  Abdomen: Soft, non-distended, and non-tender to palpation. Bowel sounds present in all 4 quadrants.  MSK: Normal strength and tone for age. *** Extremities: No lower extremity edema.    Skin: Warm and dry. Neuro: Alert and oriented x3. No focal deficits. Psych: Normal affect. Responds appropriately.   Assessment:    No diagnosis found.  Plan:     Disposition: Follow up in ***   Medication Adjustments/Labs and Tests Ordered: Current medicines are reviewed at length with the  patient today.  Concerns regarding medicines are outlined above.  No orders of the defined types were placed in this encounter.  No orders of the defined types were placed in this encounter.   There are no Patient Instructions on file for this visit.   Signed, Darreld Mclean, PA-C  01/14/2020 5:00 PM    [place]

## 2020-01-14 NOTE — Telephone Encounter (Signed)
Called patient to see if she is interested in the Cardiac Rehab Program. Patient expressed interest. Explained scheduling process and went over insurance, patient verbalized understanding. Will contact patient for scheduling once f/u has been completed. 

## 2020-01-15 ENCOUNTER — Encounter (HOSPITAL_COMMUNITY): Payer: Medicare Other

## 2020-01-21 ENCOUNTER — Ambulatory Visit: Payer: Medicare Other | Admitting: Student

## 2020-01-28 ENCOUNTER — Ambulatory Visit (INDEPENDENT_AMBULATORY_CARE_PROVIDER_SITE_OTHER): Payer: Medicare Other | Admitting: Cardiology

## 2020-01-28 ENCOUNTER — Other Ambulatory Visit: Payer: Self-pay

## 2020-01-28 ENCOUNTER — Encounter: Payer: Self-pay | Admitting: Cardiology

## 2020-01-28 VITALS — BP 134/70 | HR 44 | Ht 62.0 in | Wt 127.0 lb

## 2020-01-28 DIAGNOSIS — I25710 Atherosclerosis of autologous vein coronary artery bypass graft(s) with unstable angina pectoris: Secondary | ICD-10-CM | POA: Diagnosis not present

## 2020-01-28 DIAGNOSIS — I10 Essential (primary) hypertension: Secondary | ICD-10-CM | POA: Diagnosis not present

## 2020-01-28 DIAGNOSIS — E785 Hyperlipidemia, unspecified: Secondary | ICD-10-CM

## 2020-01-28 DIAGNOSIS — I25700 Atherosclerosis of coronary artery bypass graft(s), unspecified, with unstable angina pectoris: Secondary | ICD-10-CM | POA: Diagnosis not present

## 2020-01-28 DIAGNOSIS — I255 Ischemic cardiomyopathy: Secondary | ICD-10-CM | POA: Diagnosis not present

## 2020-01-28 DIAGNOSIS — Z720 Tobacco use: Secondary | ICD-10-CM | POA: Diagnosis not present

## 2020-01-28 DIAGNOSIS — I2510 Atherosclerotic heart disease of native coronary artery without angina pectoris: Secondary | ICD-10-CM

## 2020-01-28 DIAGNOSIS — I480 Paroxysmal atrial fibrillation: Secondary | ICD-10-CM

## 2020-01-28 DIAGNOSIS — I4819 Other persistent atrial fibrillation: Secondary | ICD-10-CM | POA: Diagnosis not present

## 2020-01-28 MED ORDER — METOPROLOL SUCCINATE ER 25 MG PO TB24: 25.0000 mg | ORAL_TABLET | Freq: Two times a day (BID) | ORAL | 3 refills | Status: DC

## 2020-01-28 NOTE — Patient Instructions (Addendum)
Medication Instructions:   Decrease to Toprol XL ( metoprolol succinate)  25 mg twice a day   Stop taking Aspirin on Feb 5 , 2022  *If you need a refill on your cardiac medications before your next appointment, please call your pharmacy*   Lab Work: Not needed    Testing/Procedures: Not needed   Follow-Up: At [facility], you and your health needs are our priority.  As part of our continuing mission to provide you with exceptional heart care, we have created designated Provider Care Teams.  These Care Teams include your primary Cardiologist (physician) and Advanced Practice Providers (APPs -  Physician Assistants and Nurse Practitioners) who all work together to provide you with the care you need, when you need it.     Your next appointment:    3 to 4 month(s)  The format for your next appointment:   In Person  Provider:   Glenetta Hew, MD   Other Instructions

## 2020-01-28 NOTE — Progress Notes (Signed)
Primary Care Provider: Arvilla Market, DO Cardiologist: Bryan Lemma, MD Electrophysiologist: None  Clinic Note: Chief Complaint  Patient presents with  . Hospitalization Follow-up    Post cath-ACS admission follow-up; no further angina, does have exertional dyspnea  . Coronary Artery Disease    And PCI of SVG-LAD now occluded SVG-D1.  . Cardiomyopathy    EF 40 to 50% on echo/LV gram-no CHF.   ===================================  ASSESSMENT/PLAN   Problem List Items Addressed This Visit    Native CAD- 100% LAD, s/p SVG-LAD after failed LIMA-LAD - Primary (Chronic)    Distant history of LAD PTCA of the setting of acute anterior STEMI in January 1998 followed by BMS placed in the LAD but then reoccluded leading to CABG x2. Native circumflex is dominant and widely patent dating back to that timeframe, and still present now. Nondominant RCA is also free of disease.  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 Patent vein graft was now severely diseased requiring PCI, and the vein graft to a small diagonal branch is occluded leaving the diagonal being filled via retrograde from the SVG-LAD.  Thankfully, she is not having any further angina.  Plan:  Continue aspirin and Plavix until February 5, and then stop aspirin. Continue Plavix along with Xarelto for 1 year since it was an ACS presentation, then Plavix will be discontinued.  She is on 50 twice daily Toprol, I have recommended that she switch that to 25 mg twice daily because of bradycardia and borderline pressures along with fatigue.  She is on 20 mg of rosuvastatin with outstanding lipids. We could potentially have a statin holiday if fatigue worsens or remains despite reducing beta-blocker.  Blood pressure would not tolerate ACE inhibitor or ARB.      Relevant Medications    metoprolol succinate (TOPROL XL) 25 MG 24 hr tablet   Ischemic cardiomyopathy (Chronic)    Her EF was down with a troponin elevation of almost 1300 in the setting of [name] RVR which was suspected to be demand ischemia related, however I suspect that is probably the above around the non-STEMI with reduced EF led to the [name]. She initially had chest pain and then had [name].  Regardless, her EF is probably 45% when comparing the echo and LV gram findings.  I would like blood pressures have been in the low 90s on current regimen. I am reducing her Toprol dose which may then allow [place] eventually to add potentially an ARB of the spironolactone.      Relevant Medications   metoprolol succinate (TOPROL XL) 25 MG 24 hr tablet   Coronary artery disease involving autologous vein coronary bypass graft with unstable angina pectoris (HCC) (Chronic)    Unfortunately, her LIMA acutely occluded back in 1998 living [place] SVG to the LAD as an hour only so graft conduit to the LAD-diagonal system since the vein graft to the diagonal is now occluded. There are 2 tandem lesions in the LAD covered with a single DES stent on January 5. This is in the setting of recent ACS (probably demand ischemia related MI but could very well been that her [name] RVR episode was related to non-STEMI as opposed to reverse). . Now status post DES PCI to the LAD requiring 1 month DAPT plus Xarelto, at which time we will stop aspirin (February  5), to allow Plavix to continue on the Xarelto. Only able to tolerate low-dose beta-blocker which we are reducing to Toprol 25 mg twice daily because of bradycardia and low blood pressures at home.      Relevant Medications   metoprolol succinate (TOPROL XL) 25 MG 24 hr tablet   Other Relevant Orders   EKG 12-Lead (Completed)   Hyperlipidemia with target LDL less than 70 (Chronic)    Outstanding lipids with LDL 36 and total cholesterol of 96. 20 mg of rosuvastatin to continue post-cath/ACS       Relevant Medications   metoprolol succinate (TOPROL XL) 25 MG 24 hr tablet   Paroxysmal atrial fibrillation (HCC); CHA2DS2-VASc Score 3. On Xarelto (Chronic)    Thankfully, she does not seem to have had any further [name] since she converted on December 31. This difficult to tell, but I suspect that the non-STEMI triggered her [name] as opposed to the other way around.  Follow-up vascularization, she is any further irregular beats palpitations.  With bradycardia with antibacterial beta-blocker down to 25 mg Toprol twice daily. She remains on Xarelto with no bleeding issues despite also being on DAPT for now.      Relevant Medications   metoprolol succinate (TOPROL XL) 25 MG 24 hr tablet   Other Relevant Orders   EKG 12-Lead (Completed)   Tobacco abuse (Chronic)    Very minimal cigarettes, it is uses a crutch for anxiety. Not ready to stop.      Essential hypertension (Chronic)    Clearly not hypertensive, in fact she has had pressures as low as the 90s at home. Would be very reluctant to add ARB or ACE inhibitor. She is on a relatively high dose of beta-blocker 50 twice daily Toprol, with bradycardia 44-minute reduce that dose to 25 mg twice daily Toprol.      Relevant Medications   metoprolol succinate (TOPROL XL) 25 MG 24 hr tablet   Persistent atrial fibrillation with rapid ventricular response ([place])    When she does have her paroxysmal [name] is persistent.      Relevant Medications   metoprolol succinate (TOPROL XL) 25 MG 24 hr tablet      ===================================  HPI:    EDIT Dominique Barber is a 65 y.o. female with a PMH below who presents today for post cath follow-up.   STEMI 1998 w/ VF arrest & anoxic brain injury w/ status epilepticus, s/p POBA -> 1 month later--> 95% restenosis +. BMS-LAD (Dr. Rollene Fare)   Recurrent Angina 7/'98 => severe BMS ISR => CABG w/ LIMA-LAD & SVG-D1,   AMI w/ emergent re-operation w/ SVG-LAD (immediately post-op LIMA  occlusion),   Jan 2005 -> Cath with patent grafts & normal Dom LCx, non-dom RCA. (D1 was small).    Jan 08, 2020: Cardiac Cath-PCI of SVG-LAD, SVG-D1 occluded  PAF, HTN, HLD   Most recent episode of PAF December 30-31st 2021  COPD, GERD, IBS,   Thelda Stokke [place] was last seen on January 06, 2020 for ER/hospitalization follow-up-had just been admitted with probably ACS in the setting of [name] RVR. She had dynamic ST and T wave changes with T wave inversion and elevated troponin as well as reduced EF on echo. This was thought to probably be related to demand ischemia from [name] and outpatient monitor was ordered. When I saw her in clinic, she was having progressive anginal symptoms along with abdominal pain from her IBS. At this point I chose to schedule  her for cardiac catheterization. => Unfortunately, she presented to the ER later that evening with progressively worsening pain. She was scheduled for cardiac catheterization on the January 5 to allow for Xarelto to wear off.  Recent Hospitalizations:   Admitted December 30-31st, 2021 with [name] RVR, associated with IBS flareup with abdominal pain. Heart rates were in the 130s 140s.-Spontaneously converted with IV diltiazem and home dose of beta-blocker. Maintained on Xarelto. Troponins were elevated. They were anterior T wave inversions. => Outpatient Myoview ordered (canceled and converted to outpatient cath)  January 04, 2020: Went to the ER with complaints of chest discomfort, but went home because of prolonged wait => seen by EMS in the house on January 05, 2020 he had chest abdominal pain. EKG no longer at lateral T wave versions.  January 06, 2020-went to the ER with worsening anginal chest pain, unstable angina => borderline non-STEMI with troponin of 125 at 134 -> Xarelto held and scheduled for cardiac catheterization on January 5 => occluded SVG-D1, severe disease of SVG-LAD treated with a long DES stent covering 2 lesions. =>   Plan 1  month aspirin/Plavix/Xarelto, then DC aspirin. Continue Plavix for 1 year along with Xarelto and then DC Plavix.  No transition of beta-blocker because of bradycardia. No ARB cessation are added because of low blood pressures.  Reviewed  CV studies:    The following studies were reviewed today: (if available, images/films reviewed: From Epic Chart or Care Everywhere) . TTE 01/02/2020: EF 45 to 50%. Apical inferior, apical septal and apex akinetic. GRII DD. Otherwise normal valves, normal RV. Normal atria. Normal pressures. Marland Kitchen LHC-CORS-GRAFTS-PCI (01/08/2020): EF 40 to 45%. Mildly elevated LVEDP. Proximal LAD 100% occlusion, mid-distal LAD fills via SVG that has collaterals to D1. Normal dominant native LCx and nondominant RCA. SVG-D1 ostial 100% CTO, SVG-LAD severe focal disease with 75% followed by 85% calcified stenosis--DES PCI (Resolute Onyx 3.5 mm x 38 mm - 3.6 mm) Dx       PCI     Interval History:   LORAY AKARD returns now for follow-up overall doing pretty well. That chest pain symptom has gone, she still has exertional dyspnea but the chest pain definitely is improved. She still has the abdominal pain which is somewhat of a annoyance to her. The IBS is not gotten any better. She is leery and scared when she should go back to exercise. She is a little bit fatigued and worn out still.  She has not had any rapid irregular heartbeats or palpitations. No heart failure symptoms despite reduced EF. No real edema.  CV Review of Symptoms (Summary): positive for - dyspnea on exertion and Overall reduced energy, fatigue. Some lightheadedness and dizziness with positio; near syncope, and Lowish blood pressures. negative for - chest pain, edema, irregular heartbeat, loss of consciousness, orthopnea, palpitations, paroxysmal nocturnal dyspnea, rapid heart rate, shortness of breath or TIA amaurosis fugax, claudication symptoms.  The patient does not have symptoms concerning for COVID-19  infection (fever, chills, cough, or new shortness of breath).   REVIEWED OF SYSTEMS   Review of Systems  Constitutional: Positive for malaise/fatigue (Just feels tired and weak). Negative for weight loss.  HENT: Negative for congestion and nosebleeds.   Respiratory: Positive for shortness of breath (Exertional).   Gastrointestinal: Positive for abdominal pain, constipation and nausea. Negative for blood in stool, diarrhea and melena.  Genitourinary: Negative for dysuria, frequency and hematuria.  Musculoskeletal: Positive for joint pain. Negative for back pain, falls and myalgias.  Neurological: Positive for dizziness and weakness.  Psychiatric/Behavioral: Negative for depression and memory loss. The patient is nervous/anxious and has insomnia.    I have reviewed and (if needed) personally updated the patient's problem list, medications, allergies, past medical and surgical history, social and family history.   PAST MEDICAL HISTORY   Past Medical History:  Diagnosis Date  . ACS (acute coronary syndrome) ([place]) 01/06/2020   With progressive angina while troponin elevation--borderline-NON-STEMI  . ANTERIOR STEMI of LAD (x2). 123456   Complicated by VF arrest, and anoxic brain injury with status epilepticus; POBA of LAD --> 6 months later, after 2 vessel CABG she had postop complication with occluded LIMA/second anterior STEMI  . CAD in native artery & Grafts 01/1996   a) 1/'98: Ant STEMI => LAD POBA; b) 2/'98: 95% prox LAD @ POBA site --> BMS PCI (3.0 mm x 25 mm); c) 7/98 (5 months later) => BMS ISR of LAD--> CABGx2 (LIMA-LAD, SVG-D1 -> Emergent redo w/ SVG-LAD b/c acute LIMA occlusion); d) 01/2020: ACS -> DES PCI of SVG-LAD (75%&85%), TO of SVG-D1.  Marland Kitchen COPD (chronic obstructive pulmonary disease) ([place])   . GERD (gastroesophageal reflux disease)   . Hyperlipidemia   . Hypothyroidism    On Synthroid  . Iron deficiency anemia   . Irritable bowel syndrome    Followed by Dr. Juanita Craver.   . Ischemic cardiomyopathy 01/03/2020   ECHO (ACS): EF 45-50% (by Cath EF 40-45%) - Apical Inferior/Apical Anteroseptal & Apex akinetic, GR II DD.   Marland Kitchen PAF (paroxysmal atrial fibrillation) ([place]) 05/03/2014   New onset - originally with RVR  . S/P CABG x 2; with EMERGENT Redo x 1. 07/1996   Initially LIMA-LAD, SVG-D1 --> immediate LIMA failure post CABG with anterior MI --> urgent redo SVG-LAD beyond D2.; Widely patent grafts as of 2005 with left dominant system. Graft to the diagonal branch has a very small target vessel but the vein graft to LAD retrograde fills a large bifurcating D2 and antegrade fills a large D3.  . Tobacco abuse     PAST SURGICAL HISTORY   Past Surgical History:  Procedure Laterality Date  . APPENDECTOMY    . BREAST BIOPSY Right   . CESAREAN SECTION    . CHOLECYSTECTOMY    . CORONARY ANGIOPLASTY  01/19/1996   Acute anterior STEMI- LAD POBA:  p-m LAD 70-80% narrowing & focal 95% stenosis in distal 3rd --> Treated with Emergent PTCA( POBA)  (Dr. Marella Chimes  . CORONARY ANGIOPLASTY WITH STENT PLACEMENT  02/26/1996   3.0x55mm Multi-Link stent to prox/mid LAD (stenosis at recent PTCA site) (Dr. Marella Chimes)  . CORONARY ARTERY BYPASS GRAFT  07/31/1996   x2; LIMA to LAD and SVG to 1st diagonal (Dr. Remus Loffler)  . CORONARY ARTERY BYPASS GRAFT  07/31/1996   x1; SVG to distal LAD (Dr. Remus Loffler)  . CORONARY STENT INTERVENTION N/A 01/08/2020   Procedure: CORONARY STENT INTERVENTION;  Surgeon: Leonie Man, MD;  Location: [facility] INVASIVE CV LAB;; , SVG-LAD severe focal disease with 75% followed by 85% calcified stenosis--DES PCI (Resolute Onyx 3.5 mm x 38 mm - 3.6 mm)  . LEFT HEART CATH AND CORONARY ANGIOGRAPHY  07/30/1996   normal L main, LAD w/95% stenosis just before stent, diffuse 80-85% end-stent restenosis, 1st diagonal with70-75% ostial stenosis, large optional diagonal that was normal, Cfx with 2 marginals and distal PLA (all normal), RCA normal (Dr. Marella Chimes)  . LEFT HEART  CATH AND CORONARY ANGIOGRAPHY  01/19/1996   acute  anterior wall MI, anoxic encephalopahty, VF; LCfx free of disease and dominant, RCA patent, L main short and patent, LAD with 70-80% narrowing and focal 95% stenosis in distal 3rd => PTCA  (Dr. Marella Chimes)  . LEFT HEART CATH AND CORONARY ANGIOGRAPHY  01/18/2017   acute anterior wall MI, anoxic encephalopahty, VF; LCfx free of disease and dominant, RCA patent, L main short and patent, LAD with 70-80% narrowing and focal 95% stenosis in distal 3rd  (Dr. Marella Chimes)  . LEFT HEART CATH AND CORS/GRAFTS ANGIOGRAPHY N/A 01/08/2020   Procedure: LEFT HEART CATH AND CORS/GRAFTS ANGIOGRAPHY;  Surgeon: Leonie Man, MD;  Location: [facility] CV LAB; EF 40 to 45%. Mildly elevated LVEDP. Proximal LAD 100% occlusion, mid-distal LAD fills via SVG that has collaterals to D1. Normal dominant native LCx and nondominant RCA. SVG-D1 ostial 100% CTO, SVG-LAD severe focal disease with 75% followed by 85% calcified stenosis--DES PCI  . LEFT HEART CATH AND CORS/GRAFTS ANGIOGRAPHY  01/31/2003   LAD totally occluded in prox 3rd, patent Cfx, SVG to mid LAd patent, SVG to small DX1 patent, LIMA to LAD was atretic and essentially occluded in junction of prox & mid 3rd, R barciocephalic and subclavian were normal (Dr. Marella Chimes)  . NM MYOCAR PERF WALL MOTION  10/2010; 06/2014   a) LexiScan Cardiolite - mild perfusion defect r/t infarct/scar with mild periifarct ischemia in mid anterior & apical anterior region; EF 67%; abnormal, low risk study;; b) Small, moderate intensity Fixed perfusion defect - mid Anterior Wall c/w known Anterior MI. LOW RISK   . OVARIAN CYST REMOVAL    . TRANSTHORACIC ECHOCARDIOGRAM  02/2012; 06/2014   a) EF 50-55%, mild HK of anterospetal myocardium; mild MR;; b) EF 55-60%, mild HK of apical anterior wall.   Mild MR  . TRANSTHORACIC ECHOCARDIOGRAM  01/02/2020   EF 45 to 50%. Apical inferior, apical septal and apex akinetic. GRII DD. Otherwise normal  valves, normal RV. Normal atria. Normal pressures.    Immunization History  Administered Date(s) Administered  . Influenza,inj,Quad PF,6+ Mos 12/04/2013, 12/05/2014, 10/10/2017  . Influenza-Unspecified 01/03/2010  . Tdap 01/03/2013    MEDICATIONS/ALLERGIES   Current Meds  Medication Sig  . acetaminophen (TYLENOL) 500 MG tablet Take 500 mg by mouth every 6 (six) hours as needed for mild pain.  Marland Kitchen alum & mag hydroxide-simeth (MAALOX/MYLANTA) 200-200-20 MG/5ML suspension Take 15-30 mLs by mouth every 6 (six) hours as needed for indigestion or flatulence.  . Calcium Carb-Cholecalciferol (CALCIUM+D3 PO) Take 1 tablet by mouth daily with breakfast.  . cetirizine (ZYRTEC) 10 MG tablet TAKE 1 TABLET DAILY (Patient taking differently: Take 10 mg by mouth daily.)  . clopidogrel (PLAVIX) 75 MG tablet Take 1 tablet (75 mg total) by mouth daily with breakfast.  . hyoscyamine (LEVSIN) 0.125 MG tablet Take 1 tablet (0.125 mg total) by mouth 2 (two) times daily.  . metoprolol succinate (TOPROL XL) 25 MG 24 hr tablet Take 1 tablet (25 mg total) by mouth 2 (two) times daily.  . nicotine (NICODERM CQ - DOSED IN MG/24 HOURS) 14 mg/24hr patch Place 1 patch (14 mg total) onto the skin daily.  . NON FORMULARY Take 1 capsule by mouth See admin instructions. Renew Life Women's Probiotics- Take 1 capsule by mouth once a day  . NON FORMULARY Take 1 capsule by mouth See admin instructions. NOW Supplements/Peppermint Gels with Ginger & Fennel Oils, Enteric Coated, Digestive Support- Take 1 capsule by mouth before each meal  . potassium chloride (KLOR-CON) 10 MEQ  tablet Take 1 tablet (10 mEq total) by mouth at bedtime.  . rivaroxaban (XARELTO) 20 MG TABS tablet Take 1 tablet (20 mg total) by mouth daily with supper. (Patient taking differently: Take 20 mg by mouth daily.)  . rosuvastatin (CRESTOR) 20 MG tablet Take 1 tablet (20 mg total) by mouth daily.  . Simethicone 125 MG TABS Chew 125 mg by mouth See admin  instructions. Chew 125 mg by mouth three times a day after meals as needed for gas  . [DISCONTINUED] aspirin 81 MG chewable tablet Chew 1 tablet (81 mg total) by mouth daily.  . [DISCONTINUED] levothyroxine (SYNTHROID) 50 MCG tablet Take 1.5 tablets (75 mcg total) by mouth daily before breakfast. TAKE ONE AND ONE-HALF TABLETS DAILY BEFORE BREAKFAST  . [DISCONTINUED] metoprolol succinate (TOPROL-XL) 50 MG 24 hr tablet Take 1 tablet (50 mg total) by mouth 2 (two) times daily. Take with or immediately following a meal.    Allergies  Allergen Reactions  . Atorvastatin Other (See Comments)    Gas pain in abdomen with no relief  . Eluxadoline Nausea And Vomiting and Other (See Comments)    Viberzi- And, made the patient feel "high"  . Iohexol Hives and Rash    Red dye    SOCIAL HISTORY/FAMILY HISTORY   Reviewed in Epic:  Pertinent findings:  Social History   Tobacco Use  . Smoking status: Current Some Day Smoker    Packs/day: 0.25    Types: Cigarettes  . Smokeless tobacco: Former Systems developer  . Tobacco comment: Quit at time of MI, restarted in 2009  Substance Use Topics  . Alcohol use: No    Alcohol/week: 0.0 standard drinks   Social History   Social History Narrative   Married, mother of 3 children (2 living sons age 35-26 and 30-31; her daughter was the victim of a murder that took place sometime around the time of the patient's MI. She was under significant amount of stress as her daughter had gone missing. She was never found. Finally the suspect was charged and convicted in 2009. She had sessile he stop smoking until that time frame when it brought back memories and she is now back to smoking a half pack a day.   She does not get routine exercise, but is active around the house and doing housecleaning chores.   She does not drink alcohol.    OBJCTIVE -PE, EKG, labs   Wt Readings from Last 3 Encounters:  01/28/20 127 lb (57.6 kg)  01/08/20 124 lb 6.4 oz (56.4 kg)  01/06/20 127 lb  (57.6 kg)    Physical Exam: BP 134/70 (BP Location: Left Arm, Patient Position: Sitting)   Pulse (!) 44   Ht 5\' 2"  (1.575 m)   Wt 127 lb (57.6 kg)   SpO2 98%   BMI 23.23 kg/m  Physical Exam Vitals reviewed.  Constitutional:      General: She is not in acute distress.    Appearance: Normal appearance. She is normal weight. She is not ill-appearing or toxic-appearing.  HENT:     Head: Normocephalic and atraumatic.  Neck:     Vascular: No carotid bruit, hepatojugular reflux or JVD.  Cardiovascular:     Rate and Rhythm: Regular rhythm. Bradycardia present.     Chest Wall: PMI is not displaced.     Pulses: Normal pulses.     Heart sounds: S1 normal and S2 normal. Heart sounds are distant. No murmur heard. No friction rub. No gallop.   Pulmonary:  Effort: Pulmonary effort is normal.     Breath sounds: Normal breath sounds.  Chest:     Chest wall: No tenderness.  Musculoskeletal:        General: No swelling. Normal range of motion.     Cervical back: Normal range of motion and neck supple.  Neurological:     General: No focal deficit present.     Mental Status: She is alert and oriented to person, place, and time.  Psychiatric:        Thought Content: Thought content normal.        Judgment: Judgment normal.     Comments: She seems very anxious. A little less sure of herself. Now accompanied by her husband which is needed since January     Adult ECG Report  Rate: 44 ;  Rhythm: sinus bradycardia and Anterior MI, age undetermined.). Activity abnormalities in the anterolateral leads, cannot exclude ischemia.;   Narrative Interpretation: Persistent anterolateral T wave inversion.  Recent Labs: Reviewed Lab Results  Component Value Date   CHOL 96 01/07/2020   HDL 43 01/07/2020   LDLCALC 36 01/07/2020   TRIG 86 01/07/2020   CHOLHDL 2.2 01/07/2020   Lab Results  Component Value Date   CREATININE 0.84 01/09/2020   BUN 6 (L) 01/09/2020   NA 139 01/09/2020   K 3.2 (L)  01/09/2020   CL 105 01/09/2020   CO2 24 01/09/2020   CBC Latest Ref Rng & Units 01/09/2020 01/08/2020 01/08/2020  WBC 4.0 - 10.5 K/uL 9.6 11.1(H) 8.3  Hemoglobin 12.0 - 15.0 g/dL 11.1(L) 11.8(L) 11.4(L)  Hematocrit 36.0 - 46.0 % 31.9(L) 34.8(L) 34.3(L)  Platelets 150 - 400 K/uL 190 209 168    Lab Results  Component Value Date   TSH 1.139 01/02/2020    ==================================================  COVID-19 Education: The signs and symptoms of COVID-19 were discussed with the patient and how to seek care for testing (follow up with PCP or arrange E-visit).   The importance of social distancing and COVID-19 vaccination was discussed today. The patient is practicing social distancing & Masking.   I spent a total of 37minutes with the patient spent in direct patient consultation.  Additional time spent with chart review  / charting (studies, outside notes, etc): 30 min Total Time: 57 min   Current medicines are reviewed at length with the patient today.  (+/- concerns) c/o fatigue.   This visit occurred during the SARS-CoV-2 public health emergency.  Safety protocols were in place, including screening questions prior to the visit, additional usage of staff PPE, and extensive cleaning of exam room while observing appropriate contact time as indicated for disinfecting solutions.  Notice: This dictation was prepared with Dragon dictation along with smaller phrase technology. Any transcriptional errors that result from this process are unintentional and may not be corrected upon review.  Patient Instructions / Medication Changes & Studies & Tests Ordered   Patient Instructions  Medication Instructions:   Decrease to Toprol XL ( metoprolol succinate)  25 mg twice a day   Stop taking Aspirin on Feb 5 , 2022  *If you need a refill on your cardiac medications before your next appointment, please call your pharmacy*   Lab Work: Not needed    Testing/Procedures: Not  needed   Follow-Up: At [facility], you and your health needs are our priority.  As part of our continuing mission to provide you with exceptional heart care, we have created designated Provider Care Teams.  These Care Teams include  your primary Cardiologist (physician) and Advanced Practice Providers (APPs -  Physician Assistants and Nurse Practitioners) who all work together to provide you with the care you need, when you need it.     Your next appointment:    3 to 4 month(s)  The format for your next appointment:   In Person  Provider:   Glenetta Hew, MD   Other Instructions    Studies Ordered:   Orders Placed This Encounter  Procedures  . EKG 12-Lead     Glenetta Hew, M.D., M.S. Interventional Cardiologist   Pager # [phone] Phone # [phone] [address] [place], [place] [address]   Thank you for choosing Heartcare at [facility]!!

## 2020-01-30 ENCOUNTER — Other Ambulatory Visit: Payer: Self-pay | Admitting: *Deleted

## 2020-01-30 MED ORDER — LEVOTHYROXINE SODIUM 75 MCG PO TABS: 75.0000 ug | ORAL_TABLET | Freq: Every day | ORAL | 1 refills | Status: DC

## 2020-02-04 DIAGNOSIS — Z86010 Personal history of colonic polyps: Secondary | ICD-10-CM | POA: Diagnosis not present

## 2020-02-04 DIAGNOSIS — K5730 Diverticulosis of large intestine without perforation or abscess without bleeding: Secondary | ICD-10-CM | POA: Diagnosis not present

## 2020-02-04 DIAGNOSIS — K582 Mixed irritable bowel syndrome: Secondary | ICD-10-CM | POA: Diagnosis not present

## 2020-02-04 DIAGNOSIS — R140 Abdominal distension (gaseous): Secondary | ICD-10-CM | POA: Diagnosis not present

## 2020-02-05 ENCOUNTER — Other Ambulatory Visit: Payer: Self-pay | Admitting: Gastroenterology

## 2020-02-05 DIAGNOSIS — R140 Abdominal distension (gaseous): Secondary | ICD-10-CM

## 2020-02-06 ENCOUNTER — Encounter: Payer: Self-pay | Admitting: Cardiology

## 2020-02-06 NOTE — Assessment & Plan Note (Signed)
Distant history of LAD PTCA of the setting of acute anterior STEMI in January 1998 followed by BMS placed in the LAD but then reoccluded leading to CABG x2. Native circumflex is dominant and widely patent dating back to that timeframe, and still present now. Nondominant RCA is also free of disease.  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 Patent vein graft was now severely diseased requiring PCI, and the vein graft to a small diagonal branch is occluded leaving the diagonal being filled via retrograde from the SVG-LAD.  Thankfully, she is not having any further angina.  Plan:  Continue aspirin and Plavix until February 5, and then stop aspirin. Continue Plavix along with Xarelto for 1 year since it was an ACS presentation, then Plavix will be discontinued.  She is on 50 twice daily Toprol, I have recommended that she switch that to 25 mg twice daily because of bradycardia and borderline pressures along with fatigue.  She is on 20 mg of rosuvastatin with outstanding lipids. We could potentially have a statin holiday if fatigue worsens or remains despite reducing beta-blocker.  Blood pressure would not tolerate ACE inhibitor or ARB.

## 2020-02-06 NOTE — Assessment & Plan Note (Signed)
Clearly not hypertensive, in fact she has had pressures as low as the 90s at home. Would be very reluctant to add ARB or ACE inhibitor. She is on a relatively high dose of beta-blocker 50 twice daily Toprol, with bradycardia 44-minute reduce that dose to 25 mg twice daily Toprol.

## 2020-02-06 NOTE — Assessment & Plan Note (Signed)
Her EF was down with a troponin elevation of almost 1300 in the setting of [name] RVR which was suspected to be demand ischemia related, however I suspect that is probably the above around the non-STEMI with reduced EF led to the [name]. She initially had chest pain and then had [name].  Regardless, her EF is probably 45% when comparing the echo and LV gram findings.  I would like blood pressures have been in the low 90s on current regimen. I am reducing her Toprol dose which may then allow [place] eventually to add potentially an ARB of the spironolactone.

## 2020-02-06 NOTE — Assessment & Plan Note (Signed)
Unfortunately, her LIMA acutely occluded back in 1998 living [place] SVG to the LAD as an hour only so graft conduit to the LAD-diagonal system since the vein graft to the diagonal is now occluded. There are 2 tandem lesions in the LAD covered with a single DES stent on January 5. This is in the setting of recent ACS (probably demand ischemia related MI but could very well been that her [name] RVR episode was related to non-STEMI as opposed to reverse). . Now status post DES PCI to the LAD requiring 1 month DAPT plus Xarelto, at which time we will stop aspirin (February 5), to allow Plavix to continue on the Xarelto. Only able to tolerate low-dose beta-blocker which we are reducing to Toprol 25 mg twice daily because of bradycardia and low blood pressures at home.

## 2020-02-06 NOTE — Assessment & Plan Note (Signed)
Thankfully, she does not seem to have had any further [name] since she converted on December 31. This difficult to tell, but I suspect that the non-STEMI triggered her [name] as opposed to the other way around.  Follow-up vascularization, she is any further irregular beats palpitations.  With bradycardia with antibacterial beta-blocker down to 25 mg Toprol twice daily. She remains on Xarelto with no bleeding issues despite also being on DAPT for now.

## 2020-02-06 NOTE — Assessment & Plan Note (Signed)
Outstanding lipids with LDL 36 and total cholesterol of 96. 20 mg of rosuvastatin to continue post-cath/ACS

## 2020-02-06 NOTE — Assessment & Plan Note (Signed)
Very minimal cigarettes, it is uses a crutch for anxiety. Not ready to stop.

## 2020-02-06 NOTE — Assessment & Plan Note (Signed)
When she does have her paroxysmal [name] is persistent.

## 2020-02-14 ENCOUNTER — Telehealth (HOSPITAL_COMMUNITY): Payer: Self-pay

## 2020-02-14 ENCOUNTER — Encounter (HOSPITAL_COMMUNITY): Payer: Self-pay

## 2020-02-14 ENCOUNTER — Ambulatory Visit
Admission: RE | Admit: 2020-02-14 | Discharge: 2020-02-14 | Disposition: A | Discharge status: Home / Self Care | Payer: Medicare Other | Source: Ambulatory Visit | Attending: Gastroenterology | Admitting: Gastroenterology

## 2020-02-14 DIAGNOSIS — R140 Abdominal distension (gaseous): Secondary | ICD-10-CM | POA: Diagnosis not present

## 2020-02-14 DIAGNOSIS — D259 Leiomyoma of uterus, unspecified: Secondary | ICD-10-CM | POA: Diagnosis not present

## 2020-02-14 NOTE — Telephone Encounter (Signed)
Attempted to call patient in regards to Cardiac Rehab - LM on VM Mailed letter 

## 2020-02-17 ENCOUNTER — Other Ambulatory Visit: Payer: Medicare Other

## 2020-02-17 ENCOUNTER — Telehealth: Payer: Self-pay | Admitting: Cardiology

## 2020-02-17 ENCOUNTER — Other Ambulatory Visit: Payer: Self-pay | Admitting: Adult Health

## 2020-02-17 DIAGNOSIS — I25710 Atherosclerosis of autologous vein coronary artery bypass graft(s) with unstable angina pectoris: Secondary | ICD-10-CM

## 2020-02-17 DIAGNOSIS — R079 Chest pain, unspecified: Secondary | ICD-10-CM

## 2020-02-17 DIAGNOSIS — R0789 Other chest pain: Secondary | ICD-10-CM

## 2020-02-17 DIAGNOSIS — I255 Ischemic cardiomyopathy: Secondary | ICD-10-CM

## 2020-02-17 DIAGNOSIS — I25119 Atherosclerotic heart disease of native coronary artery with unspecified angina pectoris: Secondary | ICD-10-CM

## 2020-02-17 MED ORDER — CLOPIDOGREL BISULFATE 75 MG PO TABS: 75.0000 mg | ORAL_TABLET | Freq: Every day | ORAL | 1 refills | Status: DC

## 2020-02-17 NOTE — Telephone Encounter (Signed)
Per discussion - plan Lexiscan Myoview.  Dominique Hew, MD

## 2020-02-17 NOTE — Telephone Encounter (Signed)
Spoke with DOD, Dr. Ellyn Hack. He has ordered a lexiscan myoview stress test based on patient's concerns  Patient was notified. Agreed w/plan  Test ordered -- MD to sign the consent  Staff message sent to [facility] pool to arrange test

## 2020-02-17 NOTE — Telephone Encounter (Signed)
Spoke with patient of Dr. Ellyn Hack  She has chest tightness that comes and goes w/eating and drinking - she reports "stomach trouble" She had some "discomfort" before her cath/stent on 1/5 - her only reported symptoms She denies nausea/vomiting, shortness of breath  She took Maalox x2 yesterday for gas/bloating + 3 gas relief pills yesterday -- gets minimal relief She had diarrhea this morning - no bleeding issues She follows with Dr. Collene Mares for GI She cannot take any antiacids until 4 hours after thyroid medication so has not taken anything today  She reports dizziness today since she has woken up BP 125/72 and HR 59 today -- this is normal per patient 113/67 105/60  Patient would like me speak with Dr. Ellyn Hack and call her back with advice

## 2020-02-17 NOTE — Telephone Encounter (Signed)
STAT if patient feels like he/she is going to faint   1) Are you dizzy now? Yes   2) Do you feel faint or have you passed out? Feels faint, hasn't passed out   3) Do you have any other symptoms? Has stomach trouble that causes her to stay full of gas and sometimes causes chest discomfort, but is not sure if that is from dizziness   4) Have you checked your HR and BP (record if available)? 125/72 HR 59  Pt c/o of Chest Pain: STAT if CP now or developed within 24 hours  1. Are you having CP right now? Yes, like a tightness from gassy stomach she thinks   2. Are you experiencing any other symptoms (ex. SOB, nausea, vomiting, sweating)? Dizziness and feels faint   3. How long have you been experiencing CP? Started today   4. Is your CP continuous or coming and going? Continuous   5. Have you taken Nitroglycerin? No   Dominique Barber is having chest tightness/discomfort and dizziness this morning. She believes the chest discomfort is from gas build up, but is unsure. Please advise.  ?

## 2020-02-19 NOTE — Telephone Encounter (Signed)
Patient does continue to have chest pain.  Recent PCI.  Plan is for [employer].  Shared Decision Making/Informed Consent The risks [chest pain, shortness of breath, cardiac arrhythmias, dizziness, blood pressure fluctuations, myocardial infarction, stroke/transient ischemic attack, nausea, vomiting, allergic reaction, radiation exposure, metallic taste sensation and life-threatening complications (estimated to be 1 in 10,000)], benefits (risk stratification, diagnosing coronary artery disease, treatment guidance) and alternatives of a nuclear stress test were discussed in detail with Dominique Barber and she agrees to proceed.   Glenetta Hew, MD

## 2020-02-20 DIAGNOSIS — K219 Gastro-esophageal reflux disease without esophagitis: Secondary | ICD-10-CM | POA: Diagnosis not present

## 2020-02-20 DIAGNOSIS — R1013 Epigastric pain: Secondary | ICD-10-CM | POA: Diagnosis not present

## 2020-02-21 NOTE — Telephone Encounter (Signed)
Called pt, spoke with Dominique Barber, pt's husband (ok per [facility]) regarding the need to take a PPI (protonix) instead of prilosec and nexium.  Clarified with Mr. Keplinger that the xarelto and plavix are very important and if there is a major concern for ulcers then we need to revisit the plan of care.  Explained that as long as GI was onboard that the PPI would be better and not reduce the effects of the plavix which is the concern. All questions answered for husband and pt. Mr. Martello verbalized understanding and was thankful for the phone call.

## 2020-02-26 ENCOUNTER — Other Ambulatory Visit: Payer: Self-pay | Admitting: Adult Health

## 2020-02-28 ENCOUNTER — Telehealth (HOSPITAL_COMMUNITY): Payer: Self-pay

## 2020-02-28 NOTE — Telephone Encounter (Signed)
No response from pt.  Closed referral  

## 2020-04-13 ENCOUNTER — Other Ambulatory Visit: Payer: Self-pay

## 2020-04-13 ENCOUNTER — Ambulatory Visit (INDEPENDENT_AMBULATORY_CARE_PROVIDER_SITE_OTHER): Payer: Medicare Other | Admitting: Cardiology

## 2020-04-13 VITALS — BP 140/80 | HR 66 | Ht 62.0 in | Wt 139.0 lb

## 2020-04-13 DIAGNOSIS — I480 Paroxysmal atrial fibrillation: Secondary | ICD-10-CM

## 2020-04-13 DIAGNOSIS — I25119 Atherosclerotic heart disease of native coronary artery with unspecified angina pectoris: Secondary | ICD-10-CM

## 2020-04-13 DIAGNOSIS — I25710 Atherosclerosis of autologous vein coronary artery bypass graft(s) with unstable angina pectoris: Secondary | ICD-10-CM

## 2020-04-13 DIAGNOSIS — R140 Abdominal distension (gaseous): Secondary | ICD-10-CM | POA: Diagnosis not present

## 2020-04-13 DIAGNOSIS — R0789 Other chest pain: Secondary | ICD-10-CM | POA: Diagnosis not present

## 2020-04-13 DIAGNOSIS — I255 Ischemic cardiomyopathy: Secondary | ICD-10-CM

## 2020-04-13 MED ORDER — METOPROLOL SUCCINATE ER 25 MG PO TB24: 25.0000 mg | ORAL_TABLET | Freq: Two times a day (BID) | ORAL | 3 refills | Status: DC

## 2020-04-13 NOTE — Patient Instructions (Signed)
.  Medication Instructions:    you may take an additional Metoprolol tablet if need for a burst of fast heart rate   *If you need a refill on your cardiac medications before your next appointment, please call your pharmacy*   Lab Work: Not needed    Testing/Procedures:  Not needed  Follow-Up: At [facility], you and your health needs are our priority.  As part of our continuing mission to provide you with exceptional heart care, we have created designated Provider Care Teams.  These Care Teams include your primary Cardiologist (physician) and Advanced Practice Providers (APPs -  Physician Assistants and Nurse Practitioners) who all work together to provide you with the care you need, when you need it.     Your next appointment:   4-5  month(s) Aug -Sept 2022  The format for your next appointment:   In Person  Provider:   Glenetta Hew, MD   Other Instructions  okay to  Have Scope by Dr Collene Mares -  ( hold anticoagulant , and antiplatelet)  Please have Dr Collene Mares to send a official clearance for procedures

## 2020-04-13 NOTE — Progress Notes (Signed)
Primary Care Provider: Nicolette Bang, DO Cardiologist: Glenetta Hew, MD Electrophysiologist: None  Clinic Note: Chief Complaint  Patient presents with  . Follow-up    3-4 months.  . Coronary Artery Disease    Not really angina, more GI issue.  Never did have Myoview checked   ===================================  ASSESSMENT/PLAN   Problem List Items Addressed This Visit    Abdominal bloating    I really think she needs to talk to her GI doctor about further evaluation.  Sounds like she has issues with gastric motility, there is a question on EGD I wonder if maybe gastric emptying study would be reasonable.  She is preoccupied about now if to where he is if she needs an EGD done consent the be done off of Plavix and Xarelto.  I have asked that she discuss her with her PCP potential of using Reglan or potentially Linzess.  No contra indication noted with cardiac medications however there is a interaction between Reglan and cetirizine      Native CAD- 100% LAD, s/p SVG-LAD after failed LIMA-LAD (Chronic)    Her heart catheterization this January was the first in 17 years.  She has not had any issues at all since her redo bypass surgery.  The most recent cath prior to this 1 did show the diagonal graft relatively small caliber, not unsurprisingly that graft is occluded.  She is now status post stent placement to the SVG-LAD-which was interestingly difficult because of recent calcification.  Feeling better from a cardiac standpoint although she GI issues.  No further angina.  Plan:   Continue Toprol 25 mg twice daily along with rosuvastatin, clopidogrel and Xarelto.  Continue Plavix at least January 2023, but probably continue lifelong.    For urgent procedures or surgeries, could consider holding 3 to 6 months out.  There is concern that she may need EGD, and that segment since she is having significant symptoms, would not be unreasonable at this point to temporarily  hold Plavix for 5 days preprocedure.  Would be okay for her to hold Plavix 5 times she can get an EGD scheduled.      Relevant Medications   metoprolol succinate (TOPROL XL) 25 MG 24 hr tablet   Ischemic cardiomyopathy (Chronic)    EF by echo is slightly down at the time of her [name] RVR spell.  I suspect this will come back to normal.  No clear-cut indication to check for now  Plan: Continue Toprol.  BP not yet able to tolerate ARB. Euvolemic on exam.  Off of diuretic.       Relevant Medications   metoprolol succinate (TOPROL XL) 25 MG 24 hr tablet   Other Relevant Orders   EKG 12-Lead (Completed)   Coronary artery disease involving autologous vein coronary bypass graft with unstable angina pectoris (HCC) (Chronic)    1 remaining graft is in SVG-LAD stent the LIMA to LAD was occluded.  Small caliber estrogenic D1 is now occluded with retrograde filling of the native diagonal from the LAD.  She has a stent placed now in the SVG-LAD.  On Toprol and rosuvastatin. Is on clopidogrel pretty much lifelong along with Xarelto.  Typically would like to keep Plavix on without interruption for least a year, but as her presentation was not clear ACS, can probably hold Plavix (and Xarelto) after 3-4 months post PCI for urgent procedures.      Relevant Medications   metoprolol succinate (TOPROL XL) 25 MG 24 hr  tablet   Paroxysmal atrial fibrillation ([place]); CHA2DS2-VASc Score 3. On Xarelto - Primary (Chronic)    She has been having several bursts of tachycardia, cannot tell if this is [name] or not.  Thankfully she is on beta-blocker, and is on DOAC.  There is relative inconsistency with the discomfort, avoid the monitor may not actually capture an episode.  I suggested that she and her husband (who also has symptoms concerning for [name]) purchase either the EMay or Keller monitor/APP for their smart phone.  This way they can check for arrhythmias when palpitations occur.  Plan: Energy  better on lower dose of Toprol.  For now continue with the 25 mg twice daily dosing.  Continue Xarelto.  No bleeding issues.      Relevant Medications   metoprolol succinate (TOPROL XL) 25 MG 24 hr tablet   Other Relevant Orders   EKG 12-Lead (Completed)    Other Visit Diagnoses    Chest tightness       Relevant Orders   EKG 12-Lead (Completed)      ===================================  HPI:    Dominique Barber is a 65 y.o. female with a PMH below who presents today for 56-month follow-up with complaints of epigastric pain.Marland Kitchen   STEMI 1998 w/ VF arrest & anoxic brain injury w/ status epilepticus, s/p POBA -> 1 month later--> 95% restenosis +. BMS-LAD (Dr. Rollene Fare)  ? Recurrent Angina 7/'98 => severe BMS ISR => CABG w/ LIMA-LAD & SVG-D1,   AMI w/ emergent re-operation w/ SVG-LAD (immediately post-op LIMA occlusion),  ? Jan 2005 -> Cath with patent grafts & normal Dom LCx, non-dom RCA. (D1 was small).   ? 12/20-21/21 -> Admitted with Afib RVR & Demand Ischemia (+ Trop & Lat TWI => planned OP Myoview -> ? Jan 04, 2020: ER with CP (left prior to being seen - 12 + hr) => Seen @ home by EMS 01/05/20 -> EKG no longer had Lateral TWI. => ER again on 1/3 with ACS/Unstabl Angina Sx (minimal Troponin) -> planned Cath ? Jan 08, 2020: Cardiac Cath-PCI of SVG-LAD, SVG-D1 now occluded; Native LCx & RCA normal.  PAF,  ? Most recent episode of PAF December 30-31st 2021 - associated with CP & + Troponin (Demand Ischemia) _ spontaneous conversion with IV Diltiazem   HTN, HLD;  COPD, GERD, IBS,   IBS has been flaring up since Dec 2021 (mostly notes epigastric pain, bloating, early satiety & nausea).   Dominique Barber was last seen on January 28, 2020 post PCI follow-up.  She is doing pretty well.  No more chest pain.  Still having some exertional dyspnea along with IBS symptoms.  Still not going out to exercise.  Noting feeling worn out/fatigued => Toprol dose reduced to 25 mg twice daily.  Plan  to stop aspirin on February 08, 2020-continue Plavix and Xarelto.  => She called in to discuss symptoms of chest discomfort and fatigue.  Because of all her other issues we decided check a Myoview stress test.  However this is not yet been done.  Recent Hospitalizations: None  Reviewed  CV studies:    The following studies were reviewed today: (if available, images/films reviewed: From Epic Chart or Care Everywhere) . None:  Interval History:   Dominique Barber returns here today for follow-up indicating that she is still having pretty bad abdominal pain episodes.  Her husband tended to dominate the conversation telling of her having happened blood pressures that were all in  relatively normal range, but heart rate in the 120s 130s.  There is concern for possible [name].  She had no chest pain or pressure associated.  What she is noticing is extreme epigastric discomfort and bloating that radiates around the sides of her chest to her back.  This is not at all similar to the tightness squeezing she was having in the center of her chest prior to her PCI.  She had cold symptoms at the time of PCI and now chest tightness is better.  She really has not gotten back to doing much of anything because of RTI symptoms.  Her PCP and possibly her GI doctor have not been adjusting her PPI.  She says she felt better after about a week taking Protonix, but that got bad again.  She has been taking Gas-X/simethicone and MiraLAX/Maalox all with minimal benefit.  She has not had any exertional or resting dyspnea.  No PND orthopnea.  No edema.-  Fast heart rate spells lasting 10 to 15 minutes at the most 30 minutes -> no associated symptoms other than mild dyspneaand dizziness  CV Review of Symptoms (Summary): positive for - dyspnea on exertion, palpitations, rapid heart rate and Zio associated dizziness and lightheadedness with tachycardia but also with nausea negative for - chest pain, edema, orthopnea,  paroxysmal nocturnal dyspnea, shortness of breath or No true syncope or near syncope, no TIA or amaurosis fugax, claudication  The patient does not have symptoms concerning for COVID-19 infection (fever, chills, cough, or new shortness of breath).   REVIEWED OF SYSTEMS   Review of Systems  Constitutional: Positive for malaise/fatigue. Negative for weight loss (Actually putting on weight.  Feeling bloated).  HENT: Negative for congestion and nosebleeds.   Respiratory: Negative for cough, shortness of breath and wheezing.   Cardiovascular: Negative for leg swelling.  Gastrointestinal: Positive for abdominal pain (Epigastric), heartburn and nausea. Negative for blood in stool, constipation (Not as much constipation as more obstipation), diarrhea (Off and on and she finally has a BM -she can have loose stool), melena and vomiting.  Genitourinary: Negative for dysuria, frequency and hematuria.  Musculoskeletal: Positive for joint pain (At baseline). Negative for back pain and falls (But has felt unsteady).  Neurological: Positive for dizziness and tingling. Negative for weakness and headaches.  Psychiatric/Behavioral: Negative for depression and memory loss. The patient is nervous/anxious.    I have reviewed and (if needed) personally updated the patient's problem list, medications, allergies, past medical and surgical history, social and family history.   PAST MEDICAL HISTORY   Past Medical History:  Diagnosis Date  . ACS (acute coronary syndrome) ([place]) 01/06/2020   With progressive angina while troponin elevation--borderline-NON-STEMI  . ANTERIOR STEMI of LAD (x2). 16/10/9602   Complicated by VF arrest, and anoxic brain injury with status epilepticus; POBA of LAD --> 6 months later, after 2 vessel CABG she had postop complication with occluded LIMA/second anterior STEMI  . CAD in native artery & Grafts 01/1996   a) 1/'98: Ant STEMI => LAD POBA; b) 2/'98: 95% prox LAD @ POBA site --> BMS PCI  (3.0 mm x 25 mm); c) 7/98 (5 months later) => BMS ISR of LAD--> CABGx2 (LIMA-LAD, SVG-D1 -> Emergent redo w/ SVG-LAD b/c acute LIMA occlusion); d) 01/2020: ACS -> DES PCI of SVG-LAD (75%&85%), TO of SVG-D1.  Marland Kitchen COPD (chronic obstructive pulmonary disease) ([place])   . GERD (gastroesophageal reflux disease)   . Hyperlipidemia   . Hypothyroidism    On Synthroid  .  Iron deficiency anemia   . Irritable bowel syndrome    Followed by Dr. Juanita Craver.  . Ischemic cardiomyopathy 01/03/2020   ECHO (ACS): EF 45-50% (by Cath EF 40-45%) - Apical Inferior/Apical Anteroseptal & Apex akinetic, GR II DD.   Marland Kitchen PAF (paroxysmal atrial fibrillation) ([place]) 05/03/2014   New onset - originally with RVR  . S/P CABG x 2; with EMERGENT Redo x 1. 07/1996   Initially LIMA-LAD, SVG-D1 --> immediate LIMA failure post CABG with anterior MI --> urgent redo SVG-LAD beyond D2.; Widely patent grafts as of 2005 with left dominant system. Graft to the diagonal branch has a very small target vessel but the vein graft to LAD retrograde fills a large bifurcating D2 and antegrade fills a large D3.  . Tobacco abuse     PAST SURGICAL HISTORY   Past Surgical History:  Procedure Laterality Date  . APPENDECTOMY    . BREAST BIOPSY Right   . CESAREAN SECTION    . CHOLECYSTECTOMY    . CORONARY ANGIOPLASTY  01/19/1996   Acute anterior STEMI- LAD POBA:  p-m LAD 70-80% narrowing & focal 95% stenosis in distal 3rd --> Treated with Emergent PTCA( POBA)  (Dr. Marella Chimes  . CORONARY ANGIOPLASTY WITH STENT PLACEMENT  02/26/1996   3.0x42mm Multi-Link stent to prox/mid LAD (stenosis at recent PTCA site) (Dr. Marella Chimes)  . CORONARY ARTERY BYPASS GRAFT  07/31/1996   x2; LIMA to LAD and SVG to 1st diagonal (Dr. Remus Loffler)  . CORONARY ARTERY BYPASS GRAFT  07/31/1996   x1; SVG to distal LAD (Dr. Remus Loffler)  . CORONARY STENT INTERVENTION N/A 01/08/2020   Procedure: CORONARY STENT INTERVENTION;  Surgeon: Leonie Man, MD;  Location: [facility] INVASIVE CV  LAB;; , SVG-LAD severe focal disease with 75% followed by 85% calcified stenosis--DES PCI (Resolute Onyx 3.5 mm x 38 mm - 3.6 mm)  . LEFT HEART CATH AND CORONARY ANGIOGRAPHY  07/30/1996   normal L main, LAD w/95% stenosis just before stent, diffuse 80-85% end-stent restenosis, 1st diagonal with70-75% ostial stenosis, large optional diagonal that was normal, Cfx with 2 marginals and distal PLA (all normal), RCA normal (Dr. Marella Chimes)  . LEFT HEART CATH AND CORONARY ANGIOGRAPHY  01/19/1996   acute anterior wall MI, anoxic encephalopahty, VF; LCfx free of disease and dominant, RCA patent, L main short and patent, LAD with 70-80% narrowing and focal 95% stenosis in distal 3rd => PTCA  (Dr. Marella Chimes)  . LEFT HEART CATH AND CORONARY ANGIOGRAPHY  01/18/2017   acute anterior wall MI, anoxic encephalopahty, VF; LCfx free of disease and dominant, RCA patent, L main short and patent, LAD with 70-80% narrowing and focal 95% stenosis in distal 3rd  (Dr. Marella Chimes)  . LEFT HEART CATH AND CORS/GRAFTS ANGIOGRAPHY N/A 01/08/2020   Procedure: LEFT HEART CATH AND CORS/GRAFTS ANGIOGRAPHY;  Surgeon: Leonie Man, MD;  Location: [place] CV LAB; EF 40 to 45%. Mildly elevated LVEDP. Proximal LAD 100% occlusion, mid-distal LAD fills via SVG that has collaterals to D1. Normal dominant native LCx and nondominant RCA. SVG-D1 ostial 100% CTO, SVG-LAD severe focal disease with 75% followed by 85% calcified stenosis--DES PCI  . LEFT HEART CATH AND CORS/GRAFTS ANGIOGRAPHY  01/31/2003   LAD totally occluded in prox 3rd, patent Cfx, SVG to mid LAd patent, SVG to small DX1 patent, LIMA to LAD was atretic and essentially occluded in junction of prox & mid 3rd, R barciocephalic and subclavian were normal (Dr. Marella Chimes)  . [facility]  PERF WALL MOTION  10/2010; 06/2014   a) LexiScan Cardiolite - mild perfusion defect r/t infarct/scar with mild periifarct ischemia in mid anterior & apical anterior region; EF 67%; abnormal, low  risk study;; b) Small, moderate intensity Fixed perfusion defect - mid Anterior Wall c/w known Anterior MI. LOW RISK   . OVARIAN CYST REMOVAL    . TRANSTHORACIC ECHOCARDIOGRAM  02/2012; 06/2014   a) EF 50-55%, mild HK of anterospetal myocardium; mild MR;; b) EF 55-60%, mild HK of apical anterior wall.   Mild MR  . TRANSTHORACIC ECHOCARDIOGRAM  01/02/2020   EF 45 to 50%. Apical inferior, apical septal and apex akinetic. GRII DD. Otherwise normal valves, normal RV. Normal atria. Normal pressures.    LHC-CORS-GRAFTS-PCI (01/08/2020): EF 40 to 45%. Mildly elevated LVEDP. Proximal LAD 100% occlusion, mid-distal LAD fills via SVG that has collaterals to D1. Normal dominant native LCx and nondominant RCA. SVG-D1 ostial 100% CTO, SVG-LAD severe focal disease with 75% followed by 85% calcified stenosis--DES PCI (Resolute Onyx 3.5 mm x 38 mm - 3.6 mm) Dx                                                                            PCI     Immunization History  Administered Date(s) Administered  . Influenza,inj,Quad PF,6+ Mos 12/04/2013, 12/05/2014, 10/10/2017  . Influenza-Unspecified 01/03/2010  . Tdap 01/03/2013    MEDICATIONS/ALLERGIES   Current Meds  Medication Sig  . acetaminophen (TYLENOL) 500 MG tablet Take 500 mg by mouth every 6 (six) hours as needed for mild pain.  Marland Kitchen alum & mag hydroxide-simeth (MAALOX/MYLANTA) 200-200-20 MG/5ML suspension Take 15-30 mLs by mouth every 6 (six) hours as needed for indigestion or flatulence.  . Calcium Carb-Cholecalciferol (CALCIUM+D3 PO) Take 1 tablet by mouth daily with breakfast.  . cetirizine (ZYRTEC) 10 MG tablet TAKE 1 TABLET DAILY (Patient taking differently: Take 10 mg by mouth daily.)  . clopidogrel (PLAVIX) 75 MG tablet Take 1 tablet (75 mg total) by mouth daily with breakfast.  . hyoscyamine (LEVSIN) 0.125 MG tablet Take 1 tablet (0.125 mg total) by mouth 2 (two) times daily.  Marland Kitchen levothyroxine (SYNTHROID) 75 MCG tablet Take 1 tablet (75 mcg total) by  mouth daily before breakfast. Take 1 tablet daily  . nicotine (NICODERM CQ - DOSED IN MG/24 HOURS) 14 mg/24hr patch Place 1 patch (14 mg total) onto the skin daily.  . nitroGLYCERIN (NITROSTAT) 0.4 MG SL tablet Place 1 tablet (0.4 mg total) under the tongue every 5 (five) minutes x 3 doses as needed for chest pain.  . NON FORMULARY Take 1 capsule by mouth See admin instructions. Renew Life Women's Probiotics- Take 1 capsule by mouth once a day  . NON FORMULARY Take 1 capsule by mouth See admin instructions. NOW Supplements/Peppermint Gels with Ginger & Fennel Oils, Enteric Coated, Digestive Support- Take 1 capsule by mouth before each meal  . Pantoprazole Sodium (PROTONIX PO) Take 1 tablet by mouth daily.  . potassium chloride (KLOR-CON) 10 MEQ tablet Take 1 tablet (10 mEq total) by mouth at bedtime.  . rosuvastatin (CRESTOR) 20 MG tablet TAKE 1 TABLET DAILY  . Simethicone 125 MG TABS Chew 125 mg by mouth See admin instructions. Chew 125  mg by mouth three times a day after meals as needed for gas  . SYMAX-SR 0.375 MG 12 hr tablet Take 0.375 mg by mouth 2 (two) times daily.  Alveda Reasons 20 MG TABS tablet TAKE 1 TABLET DAILY WITH SUPPER  . [DISCONTINUED] metoprolol succinate (TOPROL XL) 25 MG 24 hr tablet Take 1 tablet (25 mg total) by mouth 2 (two) times daily.    Allergies  Allergen Reactions  . Atorvastatin Other (See Comments)    Gas pain in abdomen with no relief  . Eluxadoline Nausea And Vomiting and Other (See Comments)    Viberzi- And, made the patient feel "high"  . Iohexol Hives and Rash    Red dye    SOCIAL HISTORY/FAMILY HISTORY   Reviewed in Epic:  Pertinent findings:  Social History   Tobacco Use  . Smoking status: Current Some Day Smoker    Packs/day: 0.25    Types: Cigarettes  . Smokeless tobacco: Former Systems developer  . Tobacco comment: Quit at time of MI, restarted in 2009  Substance Use Topics  . Alcohol use: No    Alcohol/week: 0.0 standard drinks   Social History    Social History Narrative   Married, mother of 3 children (2 living sons age 82-26 and 30-31; her daughter was the victim of a murder that took place sometime around the time of the patient's MI. She was under significant amount of stress as her daughter had gone missing. She was never found. Finally the suspect was charged and convicted in 2009. She had sessile he stop smoking until that time frame when it brought back memories and she is now back to smoking a half pack a day.   She does not get routine exercise, but is active around the house and doing housecleaning chores.   She does not drink alcohol.    OBJCTIVE -PE, EKG, labs   Wt Readings from Last 3 Encounters:  04/13/20 139 lb (63 kg)  01/28/20 127 lb (57.6 kg)  01/08/20 124 lb 6.4 oz (56.4 kg)    Physical Exam: BP 140/80 (BP Location: Left Arm, Patient Position: Sitting, Cuff Size: Normal)   Pulse 66   Ht 5\' 2"  (1.575 m)   Wt 139 lb (63 kg)   BMI 25.42 kg/m  Physical Exam Constitutional:      General: She is not in acute distress.    Appearance: Normal appearance. She is ill-appearing (Looks like she does not feel good). She is not toxic-appearing.     Comments: Somewhat subdued, anxious.  A little uncomfortable  HENT:     Head: Normocephalic and atraumatic.  Neck:     Vascular: No carotid bruit.  Cardiovascular:     Rate and Rhythm: Normal rate and regular rhythm.     Pulses: Normal pulses.     Heart sounds: Normal heart sounds. No murmur heard. No friction rub. No gallop.   Pulmonary:     Effort: Pulmonary effort is normal. No respiratory distress.     Breath sounds: Normal breath sounds.  Chest:     Chest wall: No tenderness.  Abdominal:     General: There is distension (Mildly firm/bloated, tympanic).     Tenderness: There is abdominal tenderness. There is no guarding or rebound.     Comments: Bowel sounds heard throughout, but not hyperactive unable to assess HSM  Musculoskeletal:        General: No  swelling. Normal range of motion.     Cervical back: Normal range of  motion and neck supple.  Skin:    General: Skin is warm and dry.     Coloration: Skin is not jaundiced or pale.  Neurological:     General: No focal deficit present.     Mental Status: She is alert and oriented to person, place, and time.     Gait: Gait normal.  Psychiatric:        Thought Content: Thought content normal.        Judgment: Judgment normal.     Comments: More shy and subdued than usual deferring to her husband LOC.     Adult ECG Report  Rate: 66;  Rhythm: normal sinus rhythm and Left atrial enlargement.  Cannot rule out anterior MI, age-indeterminate;   Narrative Interpretation: Stable  Recent Labs:  reviewed  Lab Results  Component Value Date   CHOL 96 01/07/2020   HDL 43 01/07/2020   LDLCALC 36 01/07/2020   TRIG 86 01/07/2020   CHOLHDL 2.2 01/07/2020   Lab Results  Component Value Date   CREATININE 0.84 01/09/2020   BUN 6 (L) 01/09/2020   NA 139 01/09/2020   K 3.2 (L) 01/09/2020   CL 105 01/09/2020   CO2 24 01/09/2020   CBC Latest Ref Rng & Units 01/09/2020 01/08/2020 01/08/2020  WBC 4.0 - 10.5 K/uL 9.6 11.1(H) 8.3  Hemoglobin 12.0 - 15.0 g/dL 11.1(L) 11.8(L) 11.4(L)  Hematocrit 36.0 - 46.0 % 31.9(L) 34.8(L) 34.3(L)  Platelets 150 - 400 K/uL 190 209 168    Lab Results  Component Value Date   TSH 1.139 01/02/2020    ==================================================  COVID-19 Education: The signs and symptoms of COVID-19 were discussed with the patient and how to seek care for testing (follow up with PCP or arrange E-visit).   The importance of social distancing and COVID-19 vaccination was discussed today. The patient is practicing social distancing & Masking.   I spent a total of 39minutes with the patient spent in direct patient consultation.  Additional time spent with chart review  / charting (studies, outside notes, etc): 66min Total Time: 47 min   Current medicines are  reviewed at length with the patient today.  (+/- concerns) n/a  This visit occurred during the SARS-CoV-2 public health emergency.  Safety protocols were in place, including screening questions prior to the visit, additional usage of staff PPE, and extensive cleaning of exam room while observing appropriate contact time as indicated for disinfecting solutions.  Notice: This dictation was prepared with Dragon dictation along with smaller phrase technology. Any transcriptional errors that result from this process are unintentional and may not be corrected upon review.  Patient Instructions / Medication Changes & Studies & Tests Ordered   Patient Instructions  .Medication Instructions:    you may take an additional Metoprolol tablet if need for a burst of fast heart rate   *If you need a refill on your cardiac medications before your next appointment, please call your pharmacy*   Lab Work: Not needed    Testing/Procedures:  Not needed  Follow-Up: At [facility], you and your health needs are our priority.  As part of our continuing mission to provide you with exceptional heart care, we have created designated Provider Care Teams.  These Care Teams include your primary Cardiologist (physician) and Advanced Practice Providers (APPs -  Physician Assistants and Nurse Practitioners) who all work together to provide you with the care you need, when you need it.     Your next appointment:   4-5  month(s) Aug -Sept 2022  The format for your next appointment:   In Person  Provider:   Glenetta Hew, MD   Other Instructions  okay to  Have Scope by Dr Collene Mares -  ( hold anticoagulant , and antiplatelet)  Please have Dr Collene Mares to send a official clearance for procedures    Studies Ordered:   Orders Placed This Encounter  Procedures  . EKG 12-Lead     Glenetta Hew, M.D., M.S. Interventional Cardiologist   Pager # [phone] Phone # [phone] [address] [place], [place] [address]   Thank you for choosing Heartcare at [facility]!!

## 2020-04-15 ENCOUNTER — Encounter: Payer: Self-pay | Admitting: Cardiology

## 2020-04-15 DIAGNOSIS — R140 Abdominal distension (gaseous): Secondary | ICD-10-CM | POA: Insufficient documentation

## 2020-04-15 NOTE — Assessment & Plan Note (Signed)
1 remaining graft is in SVG-LAD stent the LIMA to LAD was occluded.  Small caliber estrogenic D1 is now occluded with retrograde filling of the native diagonal from the LAD.  She has a stent placed now in the SVG-LAD.  On Toprol and rosuvastatin. Is on clopidogrel pretty much lifelong along with Xarelto.  Typically would like to keep Plavix on without interruption for least a year, but as her presentation was not clear ACS, can probably hold Plavix (and Xarelto) after 3-4 months post PCI for urgent procedures.

## 2020-04-15 NOTE — Assessment & Plan Note (Signed)
She has been having several bursts of tachycardia, cannot tell if this is [name] or not.  Thankfully she is on beta-blocker, and is on DOAC.  There is relative inconsistency with the discomfort, avoid the monitor may not actually capture an episode.  I suggested that she and her husband (who also has symptoms concerning for [name]) purchase either the EMay or [place] monitor/APP for their smart phone.  This way they can check for arrhythmias when palpitations occur.  Plan: Energy better on lower dose of Toprol.  For now continue with the 25 mg twice daily dosing.  Continue Xarelto.  No bleeding issues.

## 2020-04-15 NOTE — Assessment & Plan Note (Signed)
I really think she needs to talk to her GI doctor about further evaluation.  Sounds like she has issues with gastric motility, there is a question on EGD I wonder if maybe gastric emptying study would be reasonable.  She is preoccupied about now if to where he is if she needs an EGD done consent the be done off of Plavix and Xarelto.  I have asked that she discuss her with her PCP potential of using Reglan or potentially Linzess.  No contra indication noted with cardiac medications however there is a interaction between Reglan and cetirizine

## 2020-04-15 NOTE — Assessment & Plan Note (Addendum)
Her heart catheterization this January was the first in 17 years.  She has not had any issues at all since her redo bypass surgery.  The most recent cath prior to this 1 did show the diagonal graft relatively small caliber, not unsurprisingly that graft is occluded.  She is now status post stent placement to the SVG-LAD-which was interestingly difficult because of recent calcification.  Feeling better from a cardiac standpoint although she GI issues.  No further angina.  Plan:   Continue Toprol 25 mg twice daily along with rosuvastatin, clopidogrel and Xarelto.  Continue Plavix at least January 2023, but probably continue lifelong.    For urgent procedures or surgeries, could consider holding 3 to 6 months out.  There is concern that she may need EGD, and that segment since she is having significant symptoms, would not be unreasonable at this point to temporarily hold Plavix for 5 days preprocedure.  Would be okay for her to hold Plavix 5 times she can get an EGD scheduled.

## 2020-04-15 NOTE — Assessment & Plan Note (Signed)
EF by echo is slightly down at the time of her [name] RVR spell.  I suspect this will come back to normal.  No clear-cut indication to check for now  Plan: Continue Toprol.  BP not yet able to tolerate ARB. Euvolemic on exam.  Off of diuretic.

## 2020-04-22 ENCOUNTER — Other Ambulatory Visit: Payer: Self-pay | Admitting: Gastroenterology

## 2020-04-23 ENCOUNTER — Other Ambulatory Visit (HOSPITAL_COMMUNITY): Payer: Self-pay | Admitting: Gastroenterology

## 2020-04-23 DIAGNOSIS — R110 Nausea: Secondary | ICD-10-CM

## 2020-04-24 ENCOUNTER — Telehealth: Payer: Self-pay

## 2020-04-24 NOTE — Telephone Encounter (Signed)
    Dominique Barber DOB:  03-20-1955  MRN:  [id number]   Primary Cardiologist: Glenetta Hew, MD  Chart reviewed as part of pre-operative protocol coverage. Recently seen outpatient by Dr. Ellyn Hack 04/13/20 and deemed acceptable risk for the planned procedure without further cardiovascular testing. She was cleared to hold plavix 5 days prior to her upcoming EGD with plans to restart as soon as she is cleared to do so by her gastroenterologist.  Will route to pharmacy for input on holding xarelto prior to her upcoming EGD.   Will finalize preop assessment once pharmacy recommendations are received.   Abigail Butts, PA-C 04/24/2020, 4:45 PM

## 2020-04-24 NOTE — Telephone Encounter (Signed)
   [facility] Pre-operative Risk Assessment    Patient Name: Dominique Barber  DOB: [birth year]  MRN: [id number]   HEARTCARE STAFF: - Please ensure there is not already an duplicate clearance open for this procedure. - Under Visit Info/Reason for Call, type in Other and utilize the format Clearance MM/DD/YY or Clearance TBD. Do not use dashes or single digits. - If request is for dental extraction, please clarify the # of teeth to be extracted.  Request for surgical clearance:  1. What type of surgery is being performed? EGD   2. When is this surgery scheduled? 05/08/20   3. What type of clearance is required (medical clearance vs. Pharmacy clearance to hold med vs. Both)?  Both   4. Are there any medications that need to be held prior to surgery and how long? Plavix, Xarelto   5. Practice name and name of physician performing surgery? [facility], [place], Dr. Benson Norway  6. What is the office phone number? [phone]   7.   What is the office fax number? [phone]  8.   Anesthesia type (None, local, MAC, general) ? Propofol    Jacqulynn Cadet 04/24/2020, 3:59 PM  _________________________________________________________________   (provider comments below)

## 2020-04-27 NOTE — Telephone Encounter (Signed)
Unable to reach the patient, please inform the patient to hold plavix for 5 days and Xarelto to 1 day prior to her EGD, then restart both as soon as possible after the procedure at her surgeon's discretion.

## 2020-04-27 NOTE — Telephone Encounter (Signed)
Pt has been advised of medication instructions: pt agreeable she will hold plavix x 5 days prior to procedure and will hold Xarelto x 1 day prior to procedure. Pt has been assured notes will be sent to Dr. Ulyses Amor office. Pt will resume blood thinners once felt safe by the surgeon. Pt thanked me for the call and the help.

## 2020-04-27 NOTE — Telephone Encounter (Signed)
Patient with diagnosis of A Fib on Xarelto for anticoagulation.    Procedure: EGD Date of procedure: 05/08/20   CHA2DS2-VASc Score = 3  This indicates a 3.2% annual risk of stroke. The patient's score is based upon: CHF History: No HTN History: Yes Diabetes History: No Stroke History: No Vascular Disease History: Yes Age Score: 0 Gender Score: 1    CrCl 67 mL/min Platelet count 190K  Per office protocol, patient can hold Xarelto for 1 day prior to procedure.    Patient will not need bridging with Lovenox (enoxaparin) around procedure.

## 2020-04-27 NOTE — Telephone Encounter (Signed)
    Dominique Barber DOB:  [birth year]  MRN:  [id number]   Primary Cardiologist: Glenetta Hew, MD  Chart reviewed as part of pre-operative protocol coverage. Given past medical history and time since last visit, based on ACC/AHA guidelines, Dominique Barber would be at acceptable risk for the planned procedure without further cardiovascular testing.   The patient was advised that if she develops new symptoms prior to surgery to contact our office to arrange for a follow-up visit, and she verbalized understanding.  Patient may hold Plavix for 5 days and Xarelto for 1 day prior to the procedure and will need to restart both medication as soon as possible after the procedure at the discretion of the surgeon.  I will route this recommendation to the requesting party via Epic fax function and remove from pre-op pool.  Please call with questions.  [place], [place] 04/27/2020, 4:41 PM

## 2020-04-28 NOTE — Telephone Encounter (Signed)
I clarified with the patient that since her surgery is on 05/08/2020 which is Friday, she will need to hold her Thursday's dose of Xarelto, in that case her last dose prior to the procedure would be Wednesday night.  She is okay with this explanation but still a little concerned that the holding time may be too short.  She wished to verify with Dr. Ellyn Hack just to make sure he is agreeable with the current holding time prior to EGD.

## 2020-04-28 NOTE — Telephone Encounter (Signed)
Dominique Barber is calling stating she does not believe holding her Xarelto for one day will be enough due to her taking it at night before bed. She is requesting a callback to discuss possibly holding it for two days prior. Please advise.

## 2020-04-29 NOTE — Telephone Encounter (Signed)
Xarelto Hold time is correct - is out of system after 24 hr. Glenetta Hew, MD

## 2020-04-30 ENCOUNTER — Ambulatory Visit: Payer: Medicare Other | Admitting: Cardiology

## 2020-05-05 ENCOUNTER — Other Ambulatory Visit (HOSPITAL_COMMUNITY)
Admission: RE | Admit: 2020-05-05 | Discharge: 2020-05-05 | Disposition: A | Discharge status: Home / Self Care | Payer: Medicare Other | Source: Ambulatory Visit | Attending: Gastroenterology | Admitting: Gastroenterology

## 2020-05-05 ENCOUNTER — Encounter (HOSPITAL_COMMUNITY): Payer: Self-pay | Admitting: Gastroenterology

## 2020-05-05 ENCOUNTER — Other Ambulatory Visit: Payer: Self-pay

## 2020-05-05 DIAGNOSIS — Z01812 Encounter for preprocedural laboratory examination: Secondary | ICD-10-CM | POA: Insufficient documentation

## 2020-05-05 DIAGNOSIS — Z20822 Contact with and (suspected) exposure to covid-19: Secondary | ICD-10-CM | POA: Insufficient documentation

## 2020-05-06 LAB — SARS CORONAVIRUS 2 (TAT 6-24 HRS): SARS Coronavirus 2: NEGATIVE

## 2020-05-08 ENCOUNTER — Ambulatory Visit (HOSPITAL_COMMUNITY)
Admission: RE | Admit: 2020-05-08 | Discharge: 2020-05-08 | Disposition: A | Discharge status: Home / Self Care | Payer: Medicare Other | Attending: Gastroenterology | Admitting: Gastroenterology

## 2020-05-08 ENCOUNTER — Encounter (HOSPITAL_COMMUNITY): Payer: Self-pay | Admitting: Gastroenterology

## 2020-05-08 ENCOUNTER — Encounter (HOSPITAL_COMMUNITY)
Admission: RE | Disposition: A | Discharge status: Home / Self Care | Payer: Self-pay | Source: Home / Self Care | Attending: Gastroenterology

## 2020-05-08 ENCOUNTER — Other Ambulatory Visit: Payer: Self-pay

## 2020-05-08 ENCOUNTER — Ambulatory Visit (HOSPITAL_COMMUNITY): Payer: Medicare Other | Admitting: Certified Registered"

## 2020-05-08 DIAGNOSIS — Z951 Presence of aortocoronary bypass graft: Secondary | ICD-10-CM | POA: Insufficient documentation

## 2020-05-08 DIAGNOSIS — K319 Disease of stomach and duodenum, unspecified: Secondary | ICD-10-CM | POA: Diagnosis not present

## 2020-05-08 DIAGNOSIS — Z9049 Acquired absence of other specified parts of digestive tract: Secondary | ICD-10-CM | POA: Insufficient documentation

## 2020-05-08 DIAGNOSIS — R140 Abdominal distension (gaseous): Secondary | ICD-10-CM | POA: Diagnosis not present

## 2020-05-08 DIAGNOSIS — R1013 Epigastric pain: Secondary | ICD-10-CM | POA: Diagnosis not present

## 2020-05-08 DIAGNOSIS — I252 Old myocardial infarction: Secondary | ICD-10-CM | POA: Diagnosis not present

## 2020-05-08 DIAGNOSIS — Z7902 Long term (current) use of antithrombotics/antiplatelets: Secondary | ICD-10-CM | POA: Diagnosis not present

## 2020-05-08 DIAGNOSIS — K3189 Other diseases of stomach and duodenum: Secondary | ICD-10-CM | POA: Diagnosis not present

## 2020-05-08 DIAGNOSIS — E785 Hyperlipidemia, unspecified: Secondary | ICD-10-CM | POA: Diagnosis not present

## 2020-05-08 DIAGNOSIS — I480 Paroxysmal atrial fibrillation: Secondary | ICD-10-CM | POA: Diagnosis not present

## 2020-05-08 DIAGNOSIS — Z9102 Food additives allergy status: Secondary | ICD-10-CM | POA: Insufficient documentation

## 2020-05-08 DIAGNOSIS — K449 Diaphragmatic hernia without obstruction or gangrene: Secondary | ICD-10-CM | POA: Insufficient documentation

## 2020-05-08 DIAGNOSIS — Z91041 Radiographic dye allergy status: Secondary | ICD-10-CM | POA: Diagnosis not present

## 2020-05-08 DIAGNOSIS — Z888 Allergy status to other drugs, medicaments and biological substances status: Secondary | ICD-10-CM | POA: Insufficient documentation

## 2020-05-08 DIAGNOSIS — Z955 Presence of coronary angioplasty implant and graft: Secondary | ICD-10-CM | POA: Diagnosis not present

## 2020-05-08 DIAGNOSIS — K219 Gastro-esophageal reflux disease without esophagitis: Secondary | ICD-10-CM | POA: Diagnosis not present

## 2020-05-08 DIAGNOSIS — I10 Essential (primary) hypertension: Secondary | ICD-10-CM | POA: Diagnosis not present

## 2020-05-08 HISTORY — PX: BIOPSY: SHX5522

## 2020-05-08 HISTORY — DX: Other specified postprocedural states: R11.2

## 2020-05-08 HISTORY — DX: Other specified postprocedural states: Z98.890

## 2020-05-08 HISTORY — PX: ESOPHAGOGASTRODUODENOSCOPY (EGD) WITH PROPOFOL: SHX5813

## 2020-05-08 SURGERY — ESOPHAGOGASTRODUODENOSCOPY (EGD) WITH PROPOFOL
Anesthesia: Monitor Anesthesia Care

## 2020-05-08 MED ORDER — PROPOFOL 500 MG/50ML IV EMUL
INTRAVENOUS | Status: DC | PRN
  Administered 2020-05-08: 125 ug/kg/min via INTRAVENOUS

## 2020-05-08 MED ORDER — LACTATED RINGERS IV SOLN: INTRAVENOUS | Status: DC | PRN

## 2020-05-08 MED ORDER — LACTATED RINGERS IV SOLN: Freq: Once | INTRAVENOUS | Status: AC

## 2020-05-08 MED ORDER — LIDOCAINE 2% (20 MG/ML) 5 ML SYRINGE
INTRAMUSCULAR | Status: DC | PRN
  Administered 2020-05-08: 60 mg via INTRAVENOUS

## 2020-05-08 MED ORDER — PROPOFOL 10 MG/ML IV BOLUS
INTRAVENOUS | Status: DC | PRN
  Administered 2020-05-08 (×2): 30 mg via INTRAVENOUS
  Administered 2020-05-08: 20 mg via INTRAVENOUS

## 2020-05-08 MED ORDER — SODIUM CHLORIDE 0.9 % IV SOLN: INTRAVENOUS | Status: DC

## 2020-05-08 SURGICAL SUPPLY — 14 items

## 2020-05-08 NOTE — Anesthesia Procedure Notes (Signed)
Procedure Name: MAC Date/Time: 05/08/2020 8:02 AM Performed by: Claudia Desanctis, CRNA Pre-anesthesia Checklist: Patient identified, Emergency Drugs available, Suction available and Patient being monitored Patient Re-evaluated:Patient Re-evaluated prior to induction Oxygen Delivery Method: Simple face mask

## 2020-05-08 NOTE — Discharge Instructions (Signed)

## 2020-05-08 NOTE — Anesthesia Preprocedure Evaluation (Signed)
Anesthesia Evaluation  Patient identified by MRN, date of birth, ID band Patient awake    Reviewed: Allergy & Precautions, NPO status , Patient's Chart, lab work & pertinent test results  Airway Mallampati: II  TM Distance: >3 FB Neck ROM: Full    Dental no notable dental hx.    Pulmonary COPD, former smoker,    Pulmonary exam normal breath sounds clear to auscultation       Cardiovascular hypertension, + CAD, + Past MI, + Cardiac Stents and + CABG  Normal cardiovascular exam+ dysrhythmias Atrial Fibrillation  Rhythm:Regular Rate:Normal     Neuro/Psych negative neurological ROS  negative psych ROS   GI/Hepatic negative GI ROS, Neg liver ROS,   Endo/Other  negative endocrine ROS  Renal/GU negative Renal ROS  negative genitourinary   Musculoskeletal negative musculoskeletal ROS (+)   Abdominal   Peds negative pediatric ROS (+)  Hematology negative hematology ROS (+)   Anesthesia Other Findings   Reproductive/Obstetrics negative OB ROS                             Anesthesia Physical Anesthesia Plan  ASA: III  Anesthesia Plan: MAC   Post-op Pain Management:    Induction: Intravenous  PONV Risk Score and Plan: 2 and Propofol infusion and Treatment may vary due to age or medical condition  Airway Management Planned: Simple Face Mask  Additional Equipment:   Intra-op Plan:   Post-operative Plan:   Informed Consent: I have reviewed the patients History and Physical, chart, labs and discussed the procedure including the risks, benefits and alternatives for the proposed anesthesia with the patient or authorized representative who has indicated his/her understanding and acceptance.     Dental advisory given  Plan Discussed with: CRNA and Surgeon  Anesthesia Plan Comments:         Anesthesia Quick Evaluation

## 2020-05-08 NOTE — H&P (Signed)
Dominique Barber   HPI: This 65 year old white female presents to the office for further evaluation of worsening gas and bloating. She had another MI in 01/2019 and had placed another cardiac stent placed. She is currently taking both Plavix and Xarelto. Since that time she has had worsening of her gas, bloating and abdominal pain especially post-prandially. She has been taking Maalox, a probiotic and 2 Peppermint supplements before each meal. She has 1-2 BM's per day with no obvious blood or mucus in the stool. She has good appetite and her weight has been stable. She denies having any complaints of nausea, vomiting, acid reflux, dysphagia or odynophagia. She denies having a family history of colon cancer, celiac sprue or IBD. Her last colonoscopy was done on 09/07/2016 when hyperplastic polyps were removed from the rectum, rectosigmoid colon and sigmoid colon and a sessile serrated adenoma and tubular adenoma were removed from the ascending colon.    Past Medical History:  Diagnosis Date  . ACS (acute coronary syndrome) ([place]) 01/06/2020   With progressive angina while troponin elevation--borderline-NON-STEMI  . ANTERIOR STEMI of LAD (x2). 57/32/2025   Complicated by VF arrest, and anoxic brain injury with status epilepticus; POBA of LAD --> 6 months later, after 2 vessel CABG she had postop complication with occluded LIMA/second anterior STEMI  . CAD in native artery & Grafts 01/1996   a) 1/'98: Ant STEMI => LAD POBA; b) 2/'98: 95% prox LAD @ POBA site --> BMS PCI (3.0 mm x 25 mm); c) 7/98 (5 months later) => BMS ISR of LAD--> CABGx2 (LIMA-LAD, SVG-D1 -> Emergent redo w/ SVG-LAD b/c acute LIMA occlusion); d) 01/2020: ACS -> DES PCI of SVG-LAD (75%&85%), TO of SVG-D1.  Marland Kitchen COPD (chronic obstructive pulmonary disease) ([place])   . GERD (gastroesophageal reflux disease)   . Hyperlipidemia   . Hypothyroidism    On Synthroid  . Iron deficiency anemia   . Irritable bowel syndrome    Followed by Dr.  Juanita Craver.  . Ischemic cardiomyopathy 01/03/2020   ECHO (ACS): EF 45-50% (by Cath EF 40-45%) - Apical Inferior/Apical Anteroseptal & Apex akinetic, GR II DD.   Marland Kitchen PAF (paroxysmal atrial fibrillation) ([place]) 05/03/2014   New onset - originally with RVR  . PONV (postoperative nausea and vomiting)    very gag reflex  . S/P CABG x 2; with EMERGENT Redo x 1. 07/1996   Initially LIMA-LAD, SVG-D1 --> immediate LIMA failure post CABG with anterior MI --> urgent redo SVG-LAD beyond D2.; Widely patent grafts as of 2005 with left dominant system. Graft to the diagonal branch has a very small target vessel but the vein graft to LAD retrograde fills a large bifurcating D2 and antegrade fills a large D3.  . Tobacco abuse     Past Surgical History:  Procedure Laterality Date  . APPENDECTOMY    . BREAST BIOPSY Right   . CESAREAN SECTION    . CHOLECYSTECTOMY    . CORONARY ANGIOPLASTY  01/19/1996   Acute anterior STEMI- LAD POBA:  p-m LAD 70-80% narrowing & focal 95% stenosis in distal 3rd --> Treated with Emergent PTCA( POBA)  (Dr. Marella Chimes  . CORONARY ANGIOPLASTY WITH STENT PLACEMENT  02/26/1996   3.0x55mm Multi-Link stent to prox/mid LAD (stenosis at recent PTCA site) (Dr. Marella Chimes)  . CORONARY ARTERY BYPASS GRAFT  07/31/1996   x2; LIMA to LAD and SVG to 1st diagonal (Dr. Remus Loffler)  . CORONARY ARTERY BYPASS GRAFT  07/31/1996   x1; SVG to  distal LAD (Dr. Remus Loffler)  . CORONARY STENT INTERVENTION N/A 01/08/2020   Procedure: CORONARY STENT INTERVENTION;  Surgeon: Leonie Man, MD;  Location: [facility] INVASIVE CV LAB;; , SVG-LAD severe focal disease with 75% followed by 85% calcified stenosis--DES PCI (Resolute Onyx 3.5 mm x 38 mm - 3.6 mm)  . LEFT HEART CATH AND CORONARY ANGIOGRAPHY  07/30/1996   normal L main, LAD w/95% stenosis just before stent, diffuse 80-85% end-stent restenosis, 1st diagonal with70-75% ostial stenosis, large optional diagonal that was normal, Cfx with 2 marginals and distal PLA (all  normal), RCA normal (Dr. Marella Chimes)  . LEFT HEART CATH AND CORONARY ANGIOGRAPHY  01/19/1996   acute anterior wall MI, anoxic encephalopahty, VF; LCfx free of disease and dominant, RCA patent, L main short and patent, LAD with 70-80% narrowing and focal 95% stenosis in distal 3rd => PTCA  (Dr. Marella Chimes)  . LEFT HEART CATH AND CORONARY ANGIOGRAPHY  01/18/2017   acute anterior wall MI, anoxic encephalopahty, VF; LCfx free of disease and dominant, RCA patent, L main short and patent, LAD with 70-80% narrowing and focal 95% stenosis in distal 3rd  (Dr. Marella Chimes)  . LEFT HEART CATH AND CORS/GRAFTS ANGIOGRAPHY N/A 01/08/2020   Procedure: LEFT HEART CATH AND CORS/GRAFTS ANGIOGRAPHY;  Surgeon: Leonie Man, MD;  Location: [facility] CV LAB; EF 40 to 45%. Mildly elevated LVEDP. Proximal LAD 100% occlusion, mid-distal LAD fills via SVG that has collaterals to D1. Normal dominant native LCx and nondominant RCA. SVG-D1 ostial 100% CTO, SVG-LAD severe focal disease with 75% followed by 85% calcified stenosis--DES PCI  . LEFT HEART CATH AND CORS/GRAFTS ANGIOGRAPHY  01/31/2003   LAD totally occluded in prox 3rd, patent Cfx, SVG to mid LAd patent, SVG to small DX1 patent, LIMA to LAD was atretic and essentially occluded in junction of prox & mid 3rd, R barciocephalic and subclavian were normal (Dr. Marella Chimes)  . NM MYOCAR PERF WALL MOTION  10/2010; 06/2014   a) LexiScan Cardiolite - mild perfusion defect r/t infarct/scar with mild periifarct ischemia in mid anterior & apical anterior region; EF 67%; abnormal, low risk study;; b) Small, moderate intensity Fixed perfusion defect - mid Anterior Wall c/w known Anterior MI. LOW RISK   . OVARIAN CYST REMOVAL    . TRANSTHORACIC ECHOCARDIOGRAM  02/2012; 06/2014   a) EF 50-55%, mild HK of anterospetal myocardium; mild MR;; b) EF 55-60%, mild HK of apical anterior wall.   Mild MR  . TRANSTHORACIC ECHOCARDIOGRAM  01/02/2020   EF 45 to 50%. Apical inferior, apical  septal and apex akinetic. GRII DD. Otherwise normal valves, normal RV. Normal atria. Normal pressures.    Family History  Problem Relation Age of Onset  . Heart failure Other   . Diabetes Other   . Diabetes Mother   . Breast cancer Mother   . Thyroid disease Paternal Aunt   . Thyroid disease Maternal Grandmother     Social History:  reports that she has been smoking cigarettes. She has been smoking about 0.25 packs per day. She has quit using smokeless tobacco. She reports that she does not drink alcohol. No history on file for drug use.  Allergies:  Allergies  Allergen Reactions  . Atorvastatin Other (See Comments)    Gas pain in abdomen with no relief  . Contrast Media [Iodinated Diagnostic Agents]     red  . Eluxadoline Nausea And Vomiting and Other (See Comments)    Viberzi- And, made the patient feel "high"  .  Iohexol Hives and Rash    Red dye    Medications:  Scheduled:  Continuous: . sodium chloride      No results found for this or any previous visit (from the past 24 hour(s)).   No results found.  ROS:  As stated above in the HPI otherwise negative.  Height 5\' 1"  (1.549 m), weight 62.1 kg.    PE: Gen: NAD, Alert and Oriented HEENT:  [place]/AT, EOMI Neck: Supple, no LAD Lungs: CTA Bilaterally CV: RRR without M/G/R ABD: Soft, NTND, +BS Ext: No C/C/E  Assessment/Plan: 1) Epigastric pain. 2) Bloating.  Plan: 1) EGD with biopsies.  Graclynn Vanantwerp D 05/08/2020, 7:45 AM

## 2020-05-08 NOTE — Anesthesia Postprocedure Evaluation (Signed)
Anesthesia Post Note  Patient: Dominique Barber  Procedure(s) Performed: ESOPHAGOGASTRODUODENOSCOPY (EGD) WITH PROPOFOL (N/A ) BIOPSY     Patient location during evaluation: PACU Anesthesia Type: MAC Level of consciousness: awake and alert Pain management: pain level controlled Vital Signs Assessment: post-procedure vital signs reviewed and stable Respiratory status: spontaneous breathing, nonlabored ventilation, respiratory function stable and patient connected to nasal cannula oxygen Cardiovascular status: stable and blood pressure returned to baseline Postop Assessment: no apparent nausea or vomiting Anesthetic complications: no   No complications documented.  Last Vitals:  Vitals:   05/08/20 0817 05/08/20 0829  BP: (!) 145/70 (!) 151/75  Pulse: 75 64  Resp: 15 17  Temp: 36.7 C   SpO2: 100% 100%    Last Pain:  Vitals:   05/08/20 0817  TempSrc: Oral  PainSc: 0-No pain                 [name] S

## 2020-05-08 NOTE — Op Note (Signed)
[facility] Patient Name: Dominique Barber Procedure Date: 05/08/2020 MRN: [id number] Attending MD: Carol Ada , MD Date of Birth: 1955/04/01 CSN: [id number] Age: 65 Admit Type: Outpatient Procedure:                Upper GI endoscopy Indications:              Epigastric abdominal pain, Dyspepsia Providers:                Carol Ada, MD, Kary Kos RN, RN, Tyna Jaksch Technician Referring MD:              Medicines:                Propofol per Anesthesia Complications:            No immediate complications. Estimated Blood Loss:     Estimated blood loss: none. Estimated blood loss:                            none. Procedure:                Pre-Anesthesia Assessment:                           - Prior to the procedure, a History and Physical                            was performed, and patient medications and                            allergies were reviewed. The patient's tolerance of                            previous anesthesia was also reviewed. The risks                            and benefits of the procedure and the sedation                            options and risks were discussed with the patient.                            All questions were answered, and informed consent                            was obtained. Prior Anticoagulants: The patient has                            taken Xarelto (rivaroxaban), last dose was 2 days                            prior to procedure. ASA Grade Assessment: III - A  patient with severe systemic disease. After                            reviewing the risks and benefits, the patient was                            deemed in satisfactory condition to undergo the                            procedure.                           - Sedation was administered by an anesthesia                            professional. Deep sedation was attained.                            After obtaining informed consent, the endoscope was                            passed under direct vision. Throughout the                            procedure, the patient's blood pressure, pulse, and                            oxygen saturations were monitored continuously. The                            GIF-H190 ([id number]) Olympus gastroscope was                            introduced through the mouth, and advanced to the                            second part of duodenum. The upper GI endoscopy was                            accomplished without difficulty. The patient                            tolerated the procedure well. Scope In: Scope Out: Findings:      A 2 cm hiatal hernia was present.      The entire examined stomach was normal. Biopsies were taken with a cold       forceps for Helicobacter pylori testing.      The examined duodenum was normal. Impression:               - 2 cm hiatal hernia.                           - Normal stomach. Biopsied.                           -  Normal examined duodenum. Moderate Sedation:      Not Applicable - Patient had care per Anesthesia. Recommendation:           - Patient has a contact number available for                            emergencies. The signs and symptoms of potential                            delayed complications were discussed with the                            patient. Return to normal activities tomorrow.                            Written discharge instructions were provided to the                            patient.                           - Resume regular diet.                           - Continue present medications.                           - Await pathology results.                           - Return to GI clinic in 4 weeks with Dr. Collene Mares. Procedure Code(s):        --- Professional ---                           [phone], Esophagogastroduodenoscopy, flexible,                            transoral; with biopsy, single  or multiple Diagnosis Code(s):        --- Professional ---                           R10.13, Epigastric pain                           K44.9, Diaphragmatic hernia without obstruction or                            gangrene CPT copyright 2019 [employer]. All rights reserved. The codes documented in this report are preliminary and upon coder review may  be revised to meet current compliance requirements. Carol Ada, MD Carol Ada, MD 05/08/2020 8:15:27 AM This report has been signed electronically. Number of Addenda: 0

## 2020-05-08 NOTE — Transfer of Care (Signed)
Immediate Anesthesia Transfer of Care Note  Patient: Dominique Barber  Procedure(s) Performed: ESOPHAGOGASTRODUODENOSCOPY (EGD) WITH PROPOFOL (N/A ) BIOPSY  Patient Location: PACU and Endoscopy Unit  Anesthesia Type:MAC  Level of Consciousness: awake and alert   Airway & Oxygen Therapy: Patient Spontanous Breathing  Post-op Assessment: Report given to RN and Post -op Vital signs reviewed and stable  Post vital signs: Reviewed and stable  Last Vitals:  Vitals Value Taken Time  BP    Temp    Pulse    Resp    SpO2      Last Pain:  Vitals:   05/08/20 0749  TempSrc: Oral  PainSc: 0-No pain         Complications: No complications documented.

## 2020-05-11 ENCOUNTER — Encounter (HOSPITAL_COMMUNITY): Payer: Self-pay | Admitting: Gastroenterology

## 2020-05-11 LAB — SURGICAL PATHOLOGY

## 2020-05-18 ENCOUNTER — Other Ambulatory Visit: Payer: Self-pay

## 2020-05-18 ENCOUNTER — Other Ambulatory Visit (INDEPENDENT_AMBULATORY_CARE_PROVIDER_SITE_OTHER): Payer: Medicare Other

## 2020-05-18 DIAGNOSIS — E890 Postprocedural hypothyroidism: Secondary | ICD-10-CM

## 2020-05-18 LAB — T4, FREE: Free T4: 1.28 ng/dL (ref 0.60–1.60)

## 2020-05-18 LAB — TSH: TSH: 1.28 u[IU]/mL (ref 0.35–4.50)

## 2020-05-21 ENCOUNTER — Ambulatory Visit: Payer: Medicare Other | Admitting: Endocrinology

## 2020-06-02 ENCOUNTER — Encounter: Payer: Self-pay | Admitting: Endocrinology

## 2020-06-02 ENCOUNTER — Ambulatory Visit (INDEPENDENT_AMBULATORY_CARE_PROVIDER_SITE_OTHER): Payer: Medicare Other | Admitting: Endocrinology

## 2020-06-02 ENCOUNTER — Other Ambulatory Visit: Payer: Self-pay

## 2020-06-02 VITALS — BP 140/80 | HR 78 | Ht 61.0 in | Wt 138.2 lb

## 2020-06-02 DIAGNOSIS — E890 Postprocedural hypothyroidism: Secondary | ICD-10-CM | POA: Diagnosis not present

## 2020-06-02 DIAGNOSIS — I255 Ischemic cardiomyopathy: Secondary | ICD-10-CM

## 2020-06-02 NOTE — Progress Notes (Signed)
Patient ID: Dominique Barber, female   DOB: [birth year], 65 y.o.   MRN: [id number]   Reason for Appointment: Thyroid follow-up    History of Present Illness:   Her hypothyroidism was first diagnosed in 1999 after radioactive iodine treatment for toxic nodular goiter Subsequently she became hypothyroid and has been on thyroid supplementation  She has been on the same dose about 11/2014 when her TSH was lower than usual at 0.13 Her dose was reduced from 100 mcg down to 75 g     Subsequently TSH has been normal consistently  No complaints of fatigue recently            The patient is taking the levothyroxine supplement daily in the morning on waking up on empty stomach She takes this very regularly Because of fear of allergy from dyes she is taking 50 mcg synthroid tablet and using 1-1/2 tablets daily  Not taking any calcium or iron containing vitamins at the same time in the morning   Lab Results  Component Value Date   TSH 1.28 05/18/2020   TSH 1.139 01/02/2020   TSH 1.63 05/14/2019   FREET4 1.28 05/18/2020   FREET4 1.18 05/14/2019   FREET4 1.19 12/05/2018   Wt Readings from Last 3 Encounters:  06/02/20 138 lb 3.2 oz (62.7 kg)  05/08/20 136 lb 11 oz (62 kg)  04/13/20 139 lb (63 kg)     GOITER:   She has had a multinodular goiter since her 19s. Details of this are also not available at present. She recalls that she has had a benign biopsy in the past   No symptoms of difficulty swallowing or local pressure in the neck   Allergies as of 06/02/2020      Reactions   Atorvastatin Other (See Comments)   Gas pain in abdomen with no relief   Contrast Media [iodinated Diagnostic Agents]    red   Eluxadoline Nausea And Vomiting, Other (See Comments)   Viberzi- And, made the patient feel "high"   Iohexol Hives, Rash   Red dye      Medication List       Accurate as of Jun 02, 2020  3:59 PM. If you have any questions, ask your nurse or doctor.         acetaminophen 500 MG tablet Commonly known as: TYLENOL Take 500 mg by mouth every 6 (six) hours as needed for mild pain.   albuterol 108 (90 Base) MCG/ACT inhaler Commonly known as: VENTOLIN HFA Inhale 1-2 puffs into the lungs every 6 (six) hours as needed for wheezing or shortness of breath.   Align 4 MG Caps Take 4 mg by mouth in the morning.   alum & mag hydroxide-simeth 200-200-20 MG/5ML suspension Commonly known as: MAALOX/MYLANTA Take 15-30 mLs by mouth every 6 (six) hours as needed for indigestion or flatulence.   BEANO PO Take 2 capsules by mouth with breakfast, with lunch, and with evening meal.   CALCIUM+D3 PO Take 1 tablet by mouth at bedtime.   cetirizine 10 MG tablet Commonly known as: ZYRTEC TAKE 1 TABLET DAILY   clopidogrel 75 MG tablet Commonly known as: PLAVIX Take 1 tablet (75 mg total) by mouth daily with breakfast.   metoprolol succinate 25 MG 24 hr tablet Commonly known as: Toprol XL Take 1 tablet (25 mg total) by mouth 2 (two) times daily. May take an additional 25 mg if needed for extra burst of heart rate.   nitroGLYCERIN 0.4 MG SL  tablet Commonly known as: NITROSTAT Place 1 tablet (0.4 mg total) under the tongue every 5 (five) minutes x 3 doses as needed for chest pain.   pantoprazole 40 MG tablet Commonly known as: PROTONIX Take 40 mg by mouth daily before breakfast.   potassium chloride 10 MEQ tablet Commonly known as: KLOR-CON Take 1 tablet (10 mEq total) by mouth at bedtime. What changed:   how much to take  when to take this   rosuvastatin 20 MG tablet Commonly known as: CRESTOR TAKE 1 TABLET DAILY What changed: when to take this   Simethicone 125 MG Tabs Take 125 mg by mouth 3 (three) times daily after meals.   Symax-SR 0.375 MG 12 hr tablet Generic drug: hyoscyamine Take 0.375 mg by mouth 2 (two) times daily.   Synthroid 50 MCG tablet Generic drug: levothyroxine Take 75 mcg by mouth daily before breakfast.   Xarelto  20 MG Tabs tablet Generic drug: rivaroxaban TAKE 1 TABLET DAILY WITH SUPPER What changed: See the new instructions.       Allergies:  Allergies  Allergen Reactions  . Atorvastatin Other (See Comments)    Gas pain in abdomen with no relief  . Contrast Media [Iodinated Diagnostic Agents]     red  . Eluxadoline Nausea And Vomiting and Other (See Comments)    Viberzi- And, made the patient feel "high"  . Iohexol Hives and Rash    Red dye    Past Medical History:  Diagnosis Date  . ACS (acute coronary syndrome) ([place]) 01/06/2020   With progressive angina while troponin elevation--borderline-NON-STEMI  . ANTERIOR STEMI of LAD (x2). 28/36/6294   Complicated by VF arrest, and anoxic brain injury with status epilepticus; POBA of LAD --> 6 months later, after 2 vessel CABG she had postop complication with occluded LIMA/second anterior STEMI  . CAD in native artery & Grafts 01/1996   a) 1/'98: Ant STEMI => LAD POBA; b) 2/'98: 95% prox LAD @ POBA site --> BMS PCI (3.0 mm x 25 mm); c) 7/98 (5 months later) => BMS ISR of LAD--> CABGx2 (LIMA-LAD, SVG-D1 -> Emergent redo w/ SVG-LAD b/c acute LIMA occlusion); d) 01/2020: ACS -> DES PCI of SVG-LAD (75%&85%), TO of SVG-D1.  Marland Kitchen COPD (chronic obstructive pulmonary disease) ([place])   . GERD (gastroesophageal reflux disease)   . Hyperlipidemia   . Hypothyroidism    On Synthroid  . Iron deficiency anemia   . Irritable bowel syndrome    Followed by Dr. Juanita Craver.  . Ischemic cardiomyopathy 01/03/2020   ECHO (ACS): EF 45-50% (by Cath EF 40-45%) - Apical Inferior/Apical Anteroseptal & Apex akinetic, GR II DD.   Marland Kitchen PAF (paroxysmal atrial fibrillation) ([place]) 05/03/2014   New onset - originally with RVR  . PONV (postoperative nausea and vomiting)    very gag reflex  . S/P CABG x 2; with EMERGENT Redo x 1. 07/1996   Initially LIMA-LAD, SVG-D1 --> immediate LIMA failure post CABG with anterior MI --> urgent redo SVG-LAD beyond D2.; Widely patent grafts as of  2005 with left dominant system. Graft to the diagonal branch has a very small target vessel but the vein graft to LAD retrograde fills a large bifurcating D2 and antegrade fills a large D3.  . Tobacco abuse     Past Surgical History:  Procedure Laterality Date  . APPENDECTOMY    . BIOPSY  05/08/2020   Procedure: BIOPSY;  Surgeon: Carol Ada, MD;  Location: [facility];  Service: Endoscopy;;  . BREAST BIOPSY Right   .  CESAREAN SECTION    . CHOLECYSTECTOMY    . CORONARY ANGIOPLASTY  01/19/1996   Acute anterior STEMI- LAD POBA:  p-m LAD 70-80% narrowing & focal 95% stenosis in distal 3rd --> Treated with Emergent PTCA( POBA)  (Dr. Marella Chimes  . CORONARY ANGIOPLASTY WITH STENT PLACEMENT  02/26/1996   3.0x25mm Multi-Link stent to prox/mid LAD (stenosis at recent PTCA site) (Dr. Marella Chimes)  . CORONARY ARTERY BYPASS GRAFT  07/31/1996   x2; LIMA to LAD and SVG to 1st diagonal (Dr. Remus Loffler)  . CORONARY ARTERY BYPASS GRAFT  07/31/1996   x1; SVG to distal LAD (Dr. Remus Loffler)  . CORONARY STENT INTERVENTION N/A 01/08/2020   Procedure: CORONARY STENT INTERVENTION;  Surgeon: Leonie Man, MD;  Location[facility] INVASIVE CV LAB;; , SVG-LAD severe focal disease with 75% followed by 85% calcified stenosis--DES PCI (Resolute Onyx 3.5 mm x 38 mm - 3.6 mm)  . ESOPHAGOGASTRODUODENOSCOPY (EGD) WITH PROPOFOL N/A 05/08/2020   Procedure: ESOPHAGOGASTRODUODENOSCOPY (EGD) WITH PROPOFOL;  Surgeon: Carol Ada, MD;  Location: [facility];  Service: Endoscopy;  Laterality: N/A;  . LEFT HEART CATH AND CORONARY ANGIOGRAPHY  07/30/1996   normal L main, LAD w/95% stenosis just before stent, diffuse 80-85% end-stent restenosis, 1st diagonal with70-75% ostial stenosis, large optional diagonal that was normal, Cfx with 2 marginals and distal PLA (all normal), RCA normal (Dr. Marella Chimes)  . LEFT HEART CATH AND CORONARY ANGIOGRAPHY  01/19/1996   acute anterior wall MI, anoxic encephalopahty, VF; LCfx free of disease and  dominant, RCA patent, L main short and patent, LAD with 70-80% narrowing and focal 95% stenosis in distal 3rd => PTCA  (Dr. Marella Chimes)  . LEFT HEART CATH AND CORONARY ANGIOGRAPHY  01/18/2017   acute anterior wall MI, anoxic encephalopahty, VF; LCfx free of disease and dominant, RCA patent, L main short and patent, LAD with 70-80% narrowing and focal 95% stenosis in distal 3rd  (Dr. Marella Chimes)  . LEFT HEART CATH AND CORS/GRAFTS ANGIOGRAPHY N/A 01/08/2020   Procedure: LEFT HEART CATH AND CORS/GRAFTS ANGIOGRAPHY;  Surgeon: Leonie Man, MD;  Location: [place] CV LAB; EF 40 to 45%. Mildly elevated LVEDP. Proximal LAD 100% occlusion, mid-distal LAD fills via SVG that has collaterals to D1. Normal dominant native LCx and nondominant RCA. SVG-D1 ostial 100% CTO, SVG-LAD severe focal disease with 75% followed by 85% calcified stenosis--DES PCI  . LEFT HEART CATH AND CORS/GRAFTS ANGIOGRAPHY  01/31/2003   LAD totally occluded in prox 3rd, patent Cfx, SVG to mid LAd patent, SVG to small DX1 patent, LIMA to LAD was atretic and essentially occluded in junction of prox & mid 3rd, R barciocephalic and subclavian were normal (Dr. Marella Chimes)  . NM MYOCAR PERF WALL MOTION  10/2010; 06/2014   a) LexiScan Cardiolite - mild perfusion defect r/t infarct/scar with mild periifarct ischemia in mid anterior & apical anterior region; EF 67%; abnormal, low risk study;; b) Small, moderate intensity Fixed perfusion defect - mid Anterior Wall c/w known Anterior MI. LOW RISK   . OVARIAN CYST REMOVAL    . TRANSTHORACIC ECHOCARDIOGRAM  02/2012; 06/2014   a) EF 50-55%, mild HK of anterospetal myocardium; mild MR;; b) EF 55-60%, mild HK of apical anterior wall.   Mild MR  . TRANSTHORACIC ECHOCARDIOGRAM  01/02/2020   EF 45 to 50%. Apical inferior, apical septal and apex akinetic. GRII DD. Otherwise normal valves, normal RV. Normal atria. Normal pressures.    Family History  Problem Relation Age of Onset  .  Heart failure  Other   . Diabetes Other   . Diabetes Mother   . Breast cancer Mother   . Thyroid disease Paternal Aunt   . Thyroid disease Maternal Grandmother     Social History:  reports that she quit smoking about 4 months ago. Her smoking use included cigarettes. She smoked 0.25 packs per day. She has quit using smokeless tobacco. She reports that she does not drink alcohol. No history on file for drug use.  REVIEW Of SYSTEMS:  History of hyperlipidemia: Followed by cardiologist and taking Crestor   Lab Results  Component Value Date   CHOL 96 01/07/2020   HDL 43 01/07/2020   LDLCALC 36 01/07/2020   TRIG 86 01/07/2020   CHOLHDL 2.2 01/07/2020   She continues to be on Xarelto for PAF    Examination:   BP 140/80 (BP Location: Left Arm, Patient Position: Sitting, Cuff Size: Normal)   Pulse 78   Ht 5\' 1"  (1.549 m)   Wt 138 lb 3.2 oz (62.7 kg)   SpO2 97%   BMI 26.11 kg/m   Thyroid: She has a 2 cm firm nodule felt in the midline on swallowing Lateral lobes are just palpable on swallowing, firm and with 1 cm nodules No lymphadenopathy in the neck    Assessment    MULTINODULAR goiter: Long-standing  She still has a small persistent nodular goiter and this is mostly felt in the midline as before This is likely a remnant of the previous multinodular goiter which was treated with I-131 No further evaluation needed  Hypothyroidism, post ablative secondary to previous I-131 treatment several years ago She has been on a consistent dose of 75 mcg SYNTHROID brand name since about 2016 She feels fairly well  TSH normal again     Treatment:  Continue 75 g Synthroid using the 50 mcg prescription and 1-1/2 tablets daily Follow-up in 1 year again      Elayne Snare 06/02/2020, 3:59 PM

## 2020-06-09 DIAGNOSIS — K449 Diaphragmatic hernia without obstruction or gangrene: Secondary | ICD-10-CM | POA: Diagnosis not present

## 2020-06-09 DIAGNOSIS — E739 Lactose intolerance, unspecified: Secondary | ICD-10-CM | POA: Diagnosis not present

## 2020-06-09 DIAGNOSIS — K5730 Diverticulosis of large intestine without perforation or abscess without bleeding: Secondary | ICD-10-CM | POA: Diagnosis not present

## 2020-06-09 DIAGNOSIS — K219 Gastro-esophageal reflux disease without esophagitis: Secondary | ICD-10-CM | POA: Diagnosis not present

## 2020-07-13 ENCOUNTER — Other Ambulatory Visit: Payer: Self-pay | Admitting: Endocrinology

## 2020-08-24 ENCOUNTER — Other Ambulatory Visit: Payer: Self-pay | Admitting: Adult Health

## 2020-08-24 NOTE — Telephone Encounter (Signed)
Prescription refill request for Xarelto received.  Indication:AFIB Last office visit:HARDING  Weight:62.7KG Age:1F Scr:0.84 01/09/20 CrCl:66.1

## 2020-08-25 ENCOUNTER — Other Ambulatory Visit: Payer: Self-pay | Admitting: Cardiology

## 2020-09-04 ENCOUNTER — Other Ambulatory Visit: Payer: Self-pay | Admitting: Cardiology

## 2020-09-14 ENCOUNTER — Other Ambulatory Visit: Payer: Self-pay

## 2020-09-14 ENCOUNTER — Encounter: Payer: Self-pay | Admitting: Cardiology

## 2020-09-14 ENCOUNTER — Ambulatory Visit (INDEPENDENT_AMBULATORY_CARE_PROVIDER_SITE_OTHER): Payer: Medicare Other | Admitting: Cardiology

## 2020-09-14 VITALS — BP 128/82 | HR 77 | Ht 61.0 in | Wt 149.0 lb

## 2020-09-14 DIAGNOSIS — I25119 Atherosclerotic heart disease of native coronary artery with unspecified angina pectoris: Secondary | ICD-10-CM

## 2020-09-14 DIAGNOSIS — I25710 Atherosclerosis of autologous vein coronary artery bypass graft(s) with unstable angina pectoris: Secondary | ICD-10-CM

## 2020-09-14 DIAGNOSIS — I480 Paroxysmal atrial fibrillation: Secondary | ICD-10-CM | POA: Diagnosis not present

## 2020-09-14 DIAGNOSIS — E785 Hyperlipidemia, unspecified: Secondary | ICD-10-CM

## 2020-09-14 DIAGNOSIS — I255 Ischemic cardiomyopathy: Secondary | ICD-10-CM

## 2020-09-14 DIAGNOSIS — Z720 Tobacco use: Secondary | ICD-10-CM

## 2020-09-14 DIAGNOSIS — I10 Essential (primary) hypertension: Secondary | ICD-10-CM | POA: Diagnosis not present

## 2020-09-14 NOTE — Patient Instructions (Signed)
Medication Instructions:  No changes *If you need a refill on your cardiac medications before your next appointment, please call your pharmacy*   Lab Work: None ordered If you have labs (blood work) drawn today and your tests are completely normal, you will receive your results only by: [place] (if you have MyChart) OR A paper copy in the mail If you have any lab test that is abnormal or we need to change your treatment, we will call you to review the results.   Testing/Procedures: None ordered   Follow-Up: At [facility], you and your health needs are our priority.  As part of our continuing mission to provide you with exceptional heart care, we have created designated Provider Care Teams.  These Care Teams include your primary Cardiologist (physician) and Advanced Practice Providers (APPs -  Physician Assistants and Nurse Practitioners) who all work together to provide you with the care you need, when you need it.  We recommend signing up for the patient portal called "MyChart".  Sign up information is provided on this After Visit Summary.  MyChart is used to connect with patients for Virtual Visits (Telemedicine).  Patients are able to view lab/test results, encounter notes, upcoming appointments, etc.  Non-urgent messages can be sent to your provider as well.   To learn more about what you can do with MyChart, go to [email or web address].    Your next appointment:   6 month(s)  The format for your next appointment:   In Person  Provider:   You may see Glenetta Hew, MD or one of the following Advanced Practice Providers on your designated Care Team:   Rosaria Ferries, PA-C Caron Presume, PA-C Jory Sims, DNP, ANP

## 2020-09-14 NOTE — Progress Notes (Signed)
Primary Care Provider: Nicolette Bang, MD Cardiologist: Glenetta Hew, MD Electrophysiologist: None  Clinic Note: Chief Complaint  Patient presents with   Follow-up    4-5 months.   Shortness of Breath    ===================================  ASSESSMENT/PLAN   Problem List Items Addressed This Visit       Cardiology Problems   Native CAD- 100% LAD, s/p SVG-LAD after failed LIMA-LAD (Chronic)    Native LAD disease dating back to her initial MI.  This was upstream of the diagonal branch.  Eventually she went for CABG with LIMA to LAD that shut down and ended up with SVG-to LAD and SVG-diagonal.   Most recent cath in January 2022 showed occluded SVG-diagonal and had PCI to the SVG-LAD.  Since then no real anginal symptoms.  Lots of GI issues but no cardiac issues.  Plan: Continue Toprol 25 mg twice daily along with current dose of rosuvastatin Continue Plavix until least January 2023-with her history of in-stent restenosis and being graft dependent, would probably continue at least for another year unless his bleeding issues while on Plavix plus Xarelto. Okay to hold Plavix 5 to 7 days preop for surgeries or procedures.      Ischemic cardiomyopathy (Chronic)    Mildly reduced EF on most recent echo, but no active heart failure symptoms.  Euvolemic on exam. No diuretic requirement. BP would not tolerate anything more than current low-dose Toprol.      Hyperlipidemia with target LDL less than 70 (Chronic)    Due for labs recheck in January 2023.  Or outstanding on current medications in January 2022      Paroxysmal atrial fibrillation ([place]); CHA2DS2-VASc Score 3. On Xarelto - Primary (Chronic)    My last saw her, she was having more frequent tachycardia spells than now.  These seem to have settled down.  She is on a stable dose of Toprol and not requiring additional dosing.  We had reduced the dose of Toprol and switched to 25 mg twice daily as opposed to 50 mg  daily.  When doing this, her rates seem to stabilized out.  Energy level improved with this change.  Plan: Continue current dosing of metoprolol along with Xarelto. - > Okay to hold Xarelto 48 to 72 hours preop for surgeries or procedures.      Relevant Orders   EKG 12-Lead (Completed)   Essential hypertension (Chronic)     Other   Tobacco abuse (Chronic)    Still having a hard time getting over the spinal crutch with her anxiety.  Down to very few cigarettes a day.  Continue to counsel.       ===================================  HPI:    Dominique Barber is a 65 y.o. female with a PMH noted below,who presents today for 5-82-monthfollow-up.  Cardiac History: CAD: STEMI 1998 w/ VR Arrest & ~ anoxic brain injury -> status epilepticus -> POBA-LAD --> 1 month later returned with [employer]--> 95% re-stenosis --> BMS-LAD (Dr. WRollene Fare 7/'98 Recurrent Angina -> severe ISR --> CABG x 2 (LIMA-LAD, SVG-D1) => emergent re-operation post-OP --> LIMA occlusion --> SVG-LAD. Jan 2005: Cath -> patent grafts & normal Dom LCx, non-dome RCA. Small D1). Jan 02, 2020: Admitted with Afib-RVR & Demand Ischemia (+ Trop & lat TWI) - planned OP Myoview Jan 04 2020 - ER with CP (left after 12 hr & not seen) --> EMS came out on Jan 2, EKG no longer had TWI.  Jan 06, 2020 -> returned to ER  with recurrent ACS Sx --> Cath Jan 08, 2020 Cath-PCI SVG-LAD, SVG-Diag CTO. Stable native Dom LCx & non-dome RCA PAF -Most recent episode Dec 2021 -> converted with IV Diltiazem CRFs: HTN, HLD;  COPD, GERD,   ** IBS has been flaring up since Dec 2021 (mostly notes epigastric pain, bloating, early satiety & nausea).    ELLOWYN JEFFORDS was last seen on April 13, 2020.  Major issues she was having was IBS and abdominal pain.  Was cleared for holding Plavix and Xarelto for surgery or procedures.  No further anginal symptoms since PCI.  Plan for now is to continue Xarelto and Plavix long-term but okay to hold for procedures.   Tolerating low-dose beta-blocker but not enough blood pressure for ARB.  Euvolemic-no need for diuretic.  Discussed possibly ordering [place] monitor to follow for possible recurrent [name].  Recent Hospitalizations:  EGD 05/08/2020: Biopsies taken for H. pylori.  2 cm hiatal hernia.  Normal-appearing stomach and duodenum.  Reviewed  CV studies:    The following studies were reviewed today: (if available, images/films reviewed: From Epic Chart or Care Everywhere) None:   Interval History:   [name] dictated a accompanied by her husband who now seems to be the main provider of information.  He does not really even let her answer questions. Really the only major issue she is having is stomach issues.  She says that she is barely able to tolerate a regular diet, however she is actually gaining weight. She does get a little short of breath with doing some house chores including vacuuming, but has not had any chest pain or pressure with that.  She does have intermittent episodes of symptoms that seem to be not related with any particular activity -> that is actually mostly epigastric discomfort with a squeezing sensation in her abdomen.  Not the center of the chest discomfort that she had prior to her PCI.  She is taking nitroglycerin maybe once or twice in the last several months, but really no benefit.   She has not had any breakthrough spells of [name]-nothing prolonged.  Just a few skipped beats here and there. She says a few she feels short of breath off and on, more so with activity that is more than usual, but she has become quite deconditioned.  Overall relatively stable: CV Review of Symptoms (Summary); Cardiovascular ROS: positive for - mostly epigastric discomfort, not really chest tightness or pressure.  A few skipped beats and palpitations but no prolonged spells.  Mild exertional dyspnea. negative for - edema, loss of consciousness, orthopnea, paroxysmal nocturnal  dyspnea, rapid heart rate, shortness of breath, or lightheadedness or dizziness/near syncope, TIA/amaurosis fugax, claudication  REVIEWED OF SYSTEMS   Review of Systems  Constitutional:  Positive for malaise/fatigue (Does not a lot of energy, because of her GI issues). Negative for weight loss (Actually gained weight).  HENT:  Negative for congestion and nosebleeds.   Respiratory:  Negative for cough, shortness of breath and wheezing.   Cardiovascular:        Per HPI  Gastrointestinal:  Positive for abdominal pain, constipation, diarrhea, heartburn and nausea. Negative for blood in stool and melena.       IBS pains  Genitourinary:  Negative for hematuria.  Musculoskeletal:  Positive for joint pain (Mild arthritis pains).  Neurological:  Positive for dizziness (Mild positional) and weakness (Just feels generally weak). Negative for focal weakness and headaches.  Psychiatric/Behavioral:  Negative for memory loss. The patient  is nervous/anxious. The patient does not have insomnia.        She seems somewhat down and dysthymic.   I have reviewed and (if needed) personally updated the patient's problem list, medications, allergies, past medical and surgical history, social and family history.   PAST MEDICAL HISTORY   Past Medical History:  Diagnosis Date   ACS (acute coronary syndrome) ([place]) 01/06/2020   With progressive angina while troponin elevation--borderline-NON-STEMI   ANTERIOR STEMI of LAD (x2). 123456   Complicated by VF arrest, and anoxic brain injury with status epilepticus; POBA of LAD --> 6 months later, after 2 vessel CABG she had postop complication with occluded LIMA/second anterior STEMI   CAD in native artery & Grafts 01/1996   a) 1/'98: Ant STEMI => LAD POBA; b) 2/'98: 95% prox LAD @ POBA site --> BMS PCI (3.0 mm x 25 mm); c) 7/98 (5 months later) => BMS ISR of LAD--> CABGx2 (LIMA-LAD, SVG-D1 -> Emergent redo w/ SVG-LAD b/c acute LIMA occlusion); d) 01/2020: ACS -> DES  PCI of SVG-LAD (75%&85%), TO of SVG-D1.   COPD (chronic obstructive pulmonary disease) (HCC)    GERD (gastroesophageal reflux disease)    Hyperlipidemia    Hypothyroidism    On Synthroid   Iron deficiency anemia    Irritable bowel syndrome    Followed by Dr. Juanita Craver.   Ischemic cardiomyopathy 01/03/2020   ECHO (ACS): EF 45-50% (by Cath EF 40-45%) - Apical Inferior/Apical Anteroseptal & Apex akinetic, GR II DD.    PAF (paroxysmal atrial fibrillation) ([place]) 05/03/2014   New onset - originally with RVR   PONV (postoperative nausea and vomiting)    very gag reflex   S/P CABG x 2; with EMERGENT Redo x 1. 07/1996   Initially LIMA-LAD, SVG-D1 --> immediate LIMA failure post CABG with anterior MI --> urgent redo SVG-LAD beyond D2.; Widely patent grafts as of 2005 with left dominant system. Graft to the diagonal branch has a very small target vessel but the vein graft to LAD retrograde fills a large bifurcating D2 and antegrade fills a large D3.   Tobacco abuse     PAST SURGICAL HISTORY - reviewed Pertinent RecentCardiac Studies   LHC-CORS-GRAFTS-PCI (01/08/2020): EF 40 to 45%. Mildly elevated LVEDP. Proximal LAD 100% occlusion, mid-distal LAD fills via SVG that has collaterals to D1. Normal dominant native LCx and nondominant RCA. SVG-D1 ostial 100% CTO, SVG-LAD severe focal disease with 75% followed by 85% calcified stenosis--DES PCI (Resolute Onyx 3.5 mm x 38 mm - 3.6 mm) Dx                                                                               PCI     TRANSTHORACIC ECHOCARDIOGRAM 01/02/2020: EF 45 to 50%. Apical inferior, apical septal and apex akinetic. GRII DD. Otherwise normal valves, normal RV. Normal atria. Normal pressures.  Immunization History  Administered Date(s) Administered   Influenza,inj,Quad PF,6+ Mos 12/04/2013, 12/05/2014, 10/10/2017   Influenza-Unspecified 01/03/2010   Tdap 01/03/2013    MEDICATIONS/ALLERGIES   Current Meds  Medication Sig   acetaminophen  (TYLENOL) 500 MG tablet Take 500 mg by mouth every 6 (six) hours as needed for mild pain.   albuterol (  VENTOLIN HFA) 108 (90 Base) MCG/ACT inhaler Inhale 1-2 puffs into the lungs every 6 (six) hours as needed for wheezing or shortness of breath.   Alpha-D-Galactosidase (BEANO PO) Take 2 capsules by mouth with breakfast, with lunch, and with evening meal.   alum & mag hydroxide-simeth (MAALOX/MYLANTA) 200-200-20 MG/5ML suspension Take 15-30 mLs by mouth every 6 (six) hours as needed for indigestion or flatulence.   Calcium Carb-Cholecalciferol (CALCIUM+D3 PO) Take 1 tablet by mouth at bedtime.   cetirizine (ZYRTEC) 10 MG tablet TAKE 1 TABLET DAILY (Patient taking differently: Take 10 mg by mouth daily.)   clopidogrel (PLAVIX) 75 MG tablet TAKE 1 TABLET DAILY WITH BREAKFAST   metoprolol succinate (TOPROL XL) 25 MG 24 hr tablet Take 1 tablet (25 mg total) by mouth 2 (two) times daily. May take an additional 25 mg if needed for extra burst of heart rate.   nitroGLYCERIN (NITROSTAT) 0.4 MG SL tablet Place 1 tablet (0.4 mg total) under the tongue every 5 (five) minutes x 3 doses as needed for chest pain.   pantoprazole (PROTONIX) 40 MG tablet Take 40 mg by mouth daily before breakfast.   potassium chloride (KLOR-CON) 10 MEQ tablet TAKE AS INSTRUCTED BY YOUR PRESCRIBER   Probiotic Product (ALIGN) 4 MG CAPS Take 4 mg by mouth in the morning.   rivaroxaban (XARELTO) 20 MG TABS tablet TAKE 1 TABLET DAILY WITH SUPPER   rosuvastatin (CRESTOR) 20 MG tablet TAKE 1 TABLET DAILY (Patient taking differently: Take 20 mg by mouth in the morning.)   Simethicone 125 MG TABS Take 125 mg by mouth 3 (three) times daily after meals.   SYMAX-SR 0.375 MG 12 hr tablet Take 0.375 mg by mouth 2 (two) times daily.   SYNTHROID 50 MCG tablet TAKE ONE AND ONE-HALF TABLETS DAILY BEFORE BREAKFAST    Allergies  Allergen Reactions   Atorvastatin Other (See Comments)    Gas pain in abdomen with no relief   Contrast Media [Iodinated  Diagnostic Agents]     red   Eluxadoline Nausea And Vomiting and Other (See Comments)    Viberzi- And, made the patient feel "high"   Iohexol Hives and Rash    Red dye    SOCIAL HISTORY/FAMILY HISTORY   Reviewed in Epic:  Pertinent findings:  Social History   Tobacco Use   Smoking status: Former    Packs/day: 0.25    Types: Cigarettes    Quit date: 01/04/2020    Years since quitting: 0.7   Smokeless tobacco: Former  Substance Use Topics   Alcohol use: No    Alcohol/week: 0.0 standard drinks   Social History   Social History Narrative   Married, mother of 3 children (2 living sons age 83-26 and 30-31; her daughter was the victim of a murder that took place sometime around the time of the patient's MI. She was under significant amount of stress as her daughter had gone missing. She was never found. Finally the suspect was charged and convicted in 2009. She had sessile he stop smoking until that time frame when it brought back memories and she is now back to smoking a half pack a day.   She does not get routine exercise, but is active around the house and doing housecleaning chores.   She does not drink alcohol.    OBJCTIVE -PE, EKG, labs   Wt Readings from Last 3 Encounters:  09/14/20 149 lb (67.6 kg)  06/02/20 138 lb 3.2 oz (62.7 kg)  05/08/20 136 lb 11  oz (62 kg)    Physical Exam: BP 128/82 (BP Location: Left Arm, Patient Position: Sitting, Cuff Size: Normal)   Pulse 77   Ht '5\' 1"'$  (1.549 m)   Wt 149 lb (67.6 kg)   BMI 28.15 kg/m  Physical Exam Vitals reviewed.  Constitutional:      General: She is not in acute distress.    Appearance: She is well-developed and normal weight. She is not ill-appearing or toxic-appearing.  HENT:     Head: Normocephalic and atraumatic.  Neck:     Vascular: No carotid bruit.  Cardiovascular:     Rate and Rhythm: Normal rate and regular rhythm.     Pulses: Normal pulses.     Heart sounds: Normal heart sounds. No murmur heard.   No  friction rub. No gallop.  Pulmonary:     Effort: Pulmonary effort is normal. No respiratory distress.     Breath sounds: Normal breath sounds. No wheezing, rhonchi or rales.  Chest:     Chest wall: Tenderness present.  Abdominal:     General: Bowel sounds are normal. There is no distension.     Palpations: Abdomen is soft. There is no mass.     Tenderness: There is abdominal tenderness (Diffuse tenderness in the epigastric area).     Comments: No HSM or bruit.  Musculoskeletal:        General: No swelling. Normal range of motion.     Cervical back: Normal range of motion and neck supple.  Skin:    General: Skin is warm and dry.  Neurological:     General: No focal deficit present.     Mental Status: She is alert and oriented to person, place, and time.     Motor: No weakness.  Psychiatric:        Thought Content: Thought content normal.        Judgment: Judgment normal.     Comments: She is starting to become more more subdued.  Her husband being there tends to make her less responsive.  Seems to be down     Adult ECG Report  Rate: 77 ;  Rhythm: normal sinus rhythm and Left atrial abnormality.  Cannot rule out anterior MI, age undetermined rhythm lateral T wave inversions having, exclude ischemia.--Stable. ;   Narrative Interpretation: Stable EKG.  Recent Labs:   Due for labs recheck in January. Lab Results  Component Value Date   CHOL 96 01/07/2020   HDL 43 01/07/2020   LDLCALC 36 01/07/2020   TRIG 86 01/07/2020   CHOLHDL 2.2 01/07/2020   Lab Results  Component Value Date   CREATININE 0.84 01/09/2020   BUN 6 (L) 01/09/2020   NA 139 01/09/2020   K 3.2 (L) 01/09/2020   CL 105 01/09/2020   CO2 24 01/09/2020   CBC Latest Ref Rng & Units 01/09/2020 01/08/2020 01/08/2020  WBC 4.0 - 10.5 K/uL 9.6 11.1(H) 8.3  Hemoglobin 12.0 - 15.0 g/dL 11.1(L) 11.8(L) 11.4(L)  Hematocrit 36.0 - 46.0 % 31.9(L) 34.8(L) 34.3(L)  Platelets 150 - 400 K/uL 190 209 168    Lab Results  Component  Value Date   HGBA1C 5.6 05/09/2016   Lab Results  Component Value Date   TSH 1.28 05/18/2020    ==================================================  COVID-19 Education: The signs and symptoms of COVID-19 were discussed with the patient and how to seek care for testing (follow up with PCP or arrange E-visit).    I spent a total of 25 minutes with the patient  spent in direct patient consultation.  Additional time spent with chart review  / charting (studies, outside notes, etc): 17 min Total Time: 42 min  Current medicines are reviewed at length with the patient today.  (+/- concerns) n/a  This visit occurred during the SARS-CoV-2 public health emergency.  Safety protocols were in place, including screening questions prior to the visit, additional usage of staff PPE, and extensive cleaning of exam room while observing appropriate contact time as indicated for disinfecting solutions.  Notice: This dictation was prepared with Dragon dictation along with smaller phrase technology. Any transcriptional errors that result from this process are unintentional and may not be corrected upon review.  Patient Instructions / Medication Changes & Studies & Tests Ordered   Patient Instructions  Medication Instructions:  No changes *If you need a refill on your cardiac medications before your next appointment, please call your pharmacy*   Lab Work: None ordered If you have labs (blood work) drawn today and your tests are completely normal, you will receive your results only by: MyChart Message (if you have MyChart) OR A paper copy in the mail If you have any lab test that is abnormal or we need to change your treatment, we will call you to review the results.   Testing/Procedures: None ordered   Follow-Up: At [facility], you and your health needs are our priority.  As part of our continuing mission to provide you with exceptional heart care, we have created designated Provider Care  Teams.  These Care Teams include your primary Cardiologist (physician) and Advanced Practice Providers (APPs -  Physician Assistants and Nurse Practitioners) who all work together to provide you with the care you need, when you need it.  We recommend signing up for the patient portal called "MyChart".  Sign up information is provided on this After Visit Summary.  MyChart is used to connect with patients for Virtual Visits (Telemedicine).  Patients are able to view lab/test results, encounter notes, upcoming appointments, etc.  Non-urgent messages can be sent to your provider as well.   To learn more about what you can do with MyChart, go to [email or web address].    Your next appointment:   6 month(s)  The format for your next appointment:   In Person  Provider:   You may see Glenetta Hew, MD or one of the following Advanced Practice Providers on your designated Care Team:   Rosaria Ferries, PA-C Caron Presume, PA-C Jory Sims, DNP, ANP     Studies Ordered:   Orders Placed This Encounter  Procedures   EKG 12-Lead     Glenetta Hew, M.D., M.S. Interventional Cardiologist   Pager # [phone] Phone # [phone] [address] [place], [place] [address]   Thank you for choosing [facility] at [facility]!!

## 2020-09-22 DIAGNOSIS — K5904 Chronic idiopathic constipation: Secondary | ICD-10-CM | POA: Diagnosis not present

## 2020-09-22 DIAGNOSIS — E739 Lactose intolerance, unspecified: Secondary | ICD-10-CM | POA: Diagnosis not present

## 2020-09-22 DIAGNOSIS — K5730 Diverticulosis of large intestine without perforation or abscess without bleeding: Secondary | ICD-10-CM | POA: Diagnosis not present

## 2020-09-22 DIAGNOSIS — K449 Diaphragmatic hernia without obstruction or gangrene: Secondary | ICD-10-CM | POA: Diagnosis not present

## 2020-09-22 DIAGNOSIS — R140 Abdominal distension (gaseous): Secondary | ICD-10-CM | POA: Diagnosis not present

## 2020-09-22 DIAGNOSIS — Z86010 Personal history of colonic polyps: Secondary | ICD-10-CM | POA: Diagnosis not present

## 2020-10-03 ENCOUNTER — Encounter: Payer: Self-pay | Admitting: Cardiology

## 2020-10-03 NOTE — Assessment & Plan Note (Signed)
Mildly reduced EF on most recent echo, but no active heart failure symptoms.  Euvolemic on exam. No diuretic requirement. BP would not tolerate anything more than current low-dose Toprol.

## 2020-10-03 NOTE — Assessment & Plan Note (Signed)
My last saw her, she was having more frequent tachycardia spells than now.  These seem to have settled down.  She is on a stable dose of Toprol and not requiring additional dosing.  We had reduced the dose of Toprol and switched to 25 mg twice daily as opposed to 50 mg daily.  When doing this, her rates seem to stabilized out.  Energy level improved with this change.  Plan: Continue current dosing of metoprolol along with Xarelto. - > Okay to hold Xarelto 48 to 72 hours preop for surgeries or procedures.

## 2020-10-03 NOTE — Assessment & Plan Note (Signed)
Still having a hard time getting over the spinal crutch with her anxiety.  Down to very few cigarettes a day.  Continue to counsel.

## 2020-10-03 NOTE — Assessment & Plan Note (Signed)
She has 1 remaining graft (SVG-LAD) that is patent with an already occluded LIMA. She now has stents in the SVG-LAD.  Is now on combination of Plavix plus Xarelto.  As long as no bleeding issues, I would like to continue Plavix long-term at least 2 years out from PCI, but would be excepted.  If necessary after January 2023.   For now, okay to hold for urgent procedures, as of January 2023, okay to hold 5 to 7 days preop for any procedures or surgeries.

## 2020-10-03 NOTE — Assessment & Plan Note (Signed)
Native LAD disease dating back to her initial MI.  This was upstream of the diagonal branch.  Eventually she went for CABG with LIMA to LAD that shut down and ended up with SVG-to LAD and SVG-diagonal.   Most recent cath in January 2022 showed occluded SVG-diagonal and had PCI to the SVG-LAD.  Since then no real anginal symptoms.  Lots of GI issues but no cardiac issues.  Plan:  Continue Toprol 25 mg twice daily along with current dose of rosuvastatin  Continue Plavix until least January 2023-with her history of in-stent restenosis and being graft dependent, would probably continue at least for another year unless his bleeding issues while on Plavix plus Xarelto.  Okay to hold Plavix 5 to 7 days preop for surgeries or procedures.

## 2020-10-03 NOTE — Assessment & Plan Note (Signed)
Due for labs recheck in January 2023.  Or outstanding on current medications in January 2022

## 2020-11-03 ENCOUNTER — Other Ambulatory Visit: Payer: Self-pay

## 2020-11-03 ENCOUNTER — Ambulatory Visit (INDEPENDENT_AMBULATORY_CARE_PROVIDER_SITE_OTHER): Payer: Medicare Other | Admitting: Family Medicine

## 2020-11-03 ENCOUNTER — Encounter: Payer: Self-pay | Admitting: Family Medicine

## 2020-11-03 VITALS — BP 127/83 | HR 84 | Temp 98.9°F | Resp 16 | Wt 152.4 lb

## 2020-11-03 DIAGNOSIS — D259 Leiomyoma of uterus, unspecified: Secondary | ICD-10-CM | POA: Diagnosis not present

## 2020-11-03 NOTE — Progress Notes (Signed)
Established  Patient Office Visit  Subjective:  Patient ID: Dominique Barber, female    DOB: June 04, 1955  Age: 65 y.o. MRN: [id number]  CC:  Chief Complaint  Patient presents with   referral    HPI Dominique Barber presents for complaint of abdominal pain/bloating that is worsening over several weeks. Patient has been to GI for symptoms without results. Patient also had recent imaging that showed uterine fibroids. She believes that fibroids may be causing her symptoms.   Past Medical History:  Diagnosis Date   ACS (acute coronary syndrome) ([place]) 01/06/2020   With progressive angina while troponin elevation--borderline-NON-STEMI   ANTERIOR STEMI of LAD (x2). 28/41/3244   Complicated by VF arrest, and anoxic brain injury with status epilepticus; POBA of LAD --> 6 months later, after 2 vessel CABG she had postop complication with occluded LIMA/second anterior STEMI   CAD in native artery & Grafts 01/1996   a) 1/'98: Ant STEMI => LAD POBA; b) 2/'98: 95% prox LAD @ POBA site --> BMS PCI (3.0 mm x 25 mm); c) 7/98 (5 months later) => BMS ISR of LAD--> CABGx2 (LIMA-LAD, SVG-D1 -> Emergent redo w/ SVG-LAD b/c acute LIMA occlusion); d) 01/2020: ACS -> DES PCI of SVG-LAD (75%&85%), TO of SVG-D1.   COPD (chronic obstructive pulmonary disease) (HCC)    GERD (gastroesophageal reflux disease)    Hyperlipidemia    Hypothyroidism    On Synthroid   Iron deficiency anemia    Irritable bowel syndrome    Followed by Dr. Juanita Craver.   Ischemic cardiomyopathy 01/03/2020   ECHO (ACS): EF 45-50% (by Cath EF 40-45%) - Apical Inferior/Apical Anteroseptal & Apex akinetic, GR II DD.    PAF (paroxysmal atrial fibrillation) ([place]) 05/03/2014   New onset - originally with RVR   PONV (postoperative nausea and vomiting)    very gag reflex   S/P CABG x 2; with EMERGENT Redo x 1. 07/1996   Initially LIMA-LAD, SVG-D1 --> immediate LIMA failure post CABG with anterior MI --> urgent redo SVG-LAD beyond D2.; Widely  patent grafts as of 2005 with left dominant system. Graft to the diagonal branch has a very small target vessel but the vein graft to LAD retrograde fills a large bifurcating D2 and antegrade fills a large D3.   Tobacco abuse     Past Surgical History:  Procedure Laterality Date   APPENDECTOMY     BIOPSY  05/08/2020   Procedure: BIOPSY;  Surgeon: Carol Ada, MD;  Location: [facility];  Service: Endoscopy;;   BREAST BIOPSY Right    CESAREAN SECTION     CHOLECYSTECTOMY     CORONARY ANGIOPLASTY  01/19/1996   Acute anterior STEMI- LAD POBA:  p-m LAD 70-80% narrowing & focal 95% stenosis in distal 3rd --> Treated with Emergent PTCA( POBA)  (Dr. Marella Chimes   CORONARY ANGIOPLASTY WITH STENT PLACEMENT  02/26/1996   3.0x62mm Multi-Link stent to prox/mid LAD (stenosis at recent PTCA site) (Dr. Marella Chimes)   Arnold GRAFT  07/31/1996   x2; LIMA to LAD and SVG to 1st diagonal (Dr. Remus Loffler)   CORONARY ARTERY BYPASS GRAFT  07/31/1996   x1; SVG to distal LAD (Dr. Remus Loffler)   CORONARY STENT INTERVENTION N/A 01/08/2020   Procedure: CORONARY STENT INTERVENTION;  Surgeon: Leonie Man, MD;  Location: [facility];; , SVG-LAD severe focal disease with 75% followed by 85% calcified stenosis--DES PCI (Resolute Onyx 3.5 mm x 38 mm - 3.6 mm)   ESOPHAGOGASTRODUODENOSCOPY (  EGD) WITH PROPOFOL N/A 05/08/2020   Procedure: ESOPHAGOGASTRODUODENOSCOPY (EGD) WITH PROPOFOL;  Surgeon: Carol Ada, MD;  Location: [facility];  Service: Endoscopy;  Laterality: N/A;   LEFT HEART CATH AND CORONARY ANGIOGRAPHY  07/30/1996   normal L main, LAD w/95% stenosis just before stent, diffuse 80-85% end-stent restenosis, 1st diagonal with70-75% ostial stenosis, large optional diagonal that was normal, Cfx with 2 marginals and distal PLA (all normal), RCA normal (Dr. Marella Chimes)   LEFT HEART CATH AND CORONARY ANGIOGRAPHY  01/19/1996   acute anterior wall MI, anoxic encephalopahty, VF; LCfx free of disease and  dominant, RCA patent, L main short and patent, LAD with 70-80% narrowing and focal 95% stenosis in distal 3rd => PTCA  (Dr. Marella Chimes)   LEFT HEART CATH AND CORONARY ANGIOGRAPHY  01/18/2017   acute anterior wall MI, anoxic encephalopahty, VF; LCfx free of disease and dominant, RCA patent, L main short and patent, LAD with 70-80% narrowing and focal 95% stenosis in distal 3rd  (Dr. Marella Chimes)   LEFT HEART CATH AND CORS/GRAFTS ANGIOGRAPHY N/A 01/08/2020   Procedure: LEFT HEART CATH AND CORS/GRAFTS ANGIOGRAPHY;  Surgeon: Leonie Man, MD;  Location: [facility]; EF 40 to 45%. Mildly elevated LVEDP. Proximal LAD 100% occlusion, mid-distal LAD fills via SVG that has collaterals to D1. Normal dominant native LCx and nondominant RCA. SVG-D1 ostial 100% CTO, SVG-LAD severe focal disease with 75% followed by 85% calcified stenosis--DES PCI   LEFT HEART CATH AND CORS/GRAFTS ANGIOGRAPHY  01/31/2003   LAD totally occluded in prox 3rd, patent Cfx, SVG to mid LAd patent, SVG to small DX1 patent, LIMA to LAD was atretic and essentially occluded in junction of prox & mid 3rd, R barciocephalic and subclavian were normal (Dr. Marella Chimes)   [place]  10/2010; 06/2014   a) LexiScan Cardiolite - mild perfusion defect r/t infarct/scar with mild periifarct ischemia in mid anterior & apical anterior region; EF 67%; abnormal, low risk study;; b) Small, moderate intensity Fixed perfusion defect - mid Anterior Wall c/w known Anterior MI. LOW RISK    OVARIAN CYST REMOVAL     TRANSTHORACIC ECHOCARDIOGRAM  02/2012; 06/2014   a) EF 50-55%, mild HK of anterospetal myocardium; mild MR;; b) EF 55-60%, mild HK of apical anterior wall.   Mild MR   TRANSTHORACIC ECHOCARDIOGRAM  01/02/2020   EF 45 to 50%. Apical inferior, apical septal and apex akinetic. GRII DD. Otherwise normal valves, normal RV. Normal atria. Normal pressures.    Family History  Problem Relation Age of Onset   Heart failure Other     Diabetes Other    Diabetes Mother    Breast cancer Mother    Thyroid disease Paternal Aunt    Thyroid disease Maternal Grandmother     Social History   Socioeconomic History   Marital status: Married    Spouse name: Not on file   Number of children: 3   Years of education: Not on file   Highest education level: Not on file  Occupational History    Employer: DISABLED  Tobacco Use   Smoking status: Former    Packs/day: 0.25    Types: Cigarettes    Quit date: 01/04/2020    Years since quitting: 0.8   Smokeless tobacco: Former  Substance and Sexual Activity   Alcohol use: No    Alcohol/week: 0.0 standard drinks   Drug use: Not on file   Sexual activity: Not on file  Other Topics Concern  Not on file  Social History Narrative   Married, mother of 3 children (2 living sons age 35-26 and 30-31; her daughter was the victim of a murder that took place sometime around the time of the patient's MI. She was under significant amount of stress as her daughter had gone missing. She was never found. Finally the suspect was charged and convicted in 2009. She had sessile he stop smoking until that time frame when it brought back memories and she is now back to smoking a half pack a day.   She does not get routine exercise, but is active around the house and doing housecleaning chores.   She does not drink alcohol.   Social Determinants of Health   Financial Resource Strain: Not on file  Food Insecurity: Not on file  Transportation Needs: Not on file  Physical Activity: Not on file  Stress: Not on file  Social Connections: Not on file  Intimate Partner Violence: Not on file    ROS Review of Systems  Gastrointestinal:  Positive for abdominal distention and abdominal pain. Negative for nausea and vomiting.  All other systems reviewed and are negative.  Objective:   Today's Vitals: BP 127/83   Pulse 84   Temp 98.9 F (37.2 C) (Oral)   Resp 16   Wt 152 lb 6.4 oz (69.1 kg)   SpO2  95%   BMI 28.80 kg/m   Physical Exam Vitals and nursing note reviewed.  Constitutional:      General: She is not in acute distress. Cardiovascular:     Rate and Rhythm: Normal rate and regular rhythm.  Pulmonary:     Effort: Pulmonary effort is normal.     Breath sounds: Normal breath sounds.  Abdominal:     General: There is distension.     Palpations: Abdomen is soft. There is no mass.     Tenderness: There is abdominal tenderness.  Neurological:     General: No focal deficit present.     Mental Status: She is alert and oriented to person, place, and time.    Assessment & Plan:   1. Uterine leiomyoma, unspecified location Referral to gyn for further eval/mgt - Ambulatory referral to Gynecology   Outpatient Encounter Medications as of 11/03/2020  Medication Sig   acetaminophen (TYLENOL) 500 MG tablet Take 500 mg by mouth every 6 (six) hours as needed for mild pain.   albuterol (VENTOLIN HFA) 108 (90 Base) MCG/ACT inhaler Inhale 1-2 puffs into the lungs every 6 (six) hours as needed for wheezing or shortness of breath.   Alpha-D-Galactosidase (BEANO PO) Take 2 capsules by mouth with breakfast, with lunch, and with evening meal.   alum & mag hydroxide-simeth (MAALOX/MYLANTA) 200-200-20 MG/5ML suspension Take 15-30 mLs by mouth every 6 (six) hours as needed for indigestion or flatulence.   Calcium Carb-Cholecalciferol (CALCIUM+D3 PO) Take 1 tablet by mouth at bedtime.   cetirizine (ZYRTEC) 10 MG tablet TAKE 1 TABLET DAILY (Patient taking differently: Take 10 mg by mouth daily.)   clopidogrel (PLAVIX) 75 MG tablet TAKE 1 TABLET DAILY WITH BREAKFAST   metoprolol succinate (TOPROL XL) 25 MG 24 hr tablet Take 1 tablet (25 mg total) by mouth 2 (two) times daily. May take an additional 25 mg if needed for extra burst of heart rate.   nitroGLYCERIN (NITROSTAT) 0.4 MG SL tablet Place 1 tablet (0.4 mg total) under the tongue every 5 (five) minutes x 3 doses as needed for chest pain.    pantoprazole (PROTONIX) 40  MG tablet Take 40 mg by mouth daily before breakfast.   potassium chloride (KLOR-CON) 10 MEQ tablet TAKE AS INSTRUCTED BY YOUR PRESCRIBER   Probiotic Product (ALIGN) 4 MG CAPS Take 4 mg by mouth in the morning.   rivaroxaban (XARELTO) 20 MG TABS tablet TAKE 1 TABLET DAILY WITH SUPPER   rosuvastatin (CRESTOR) 20 MG tablet TAKE 1 TABLET DAILY (Patient taking differently: Take 20 mg by mouth in the morning.)   Simethicone 125 MG TABS Take 125 mg by mouth 3 (three) times daily after meals.   SYMAX-SR 0.375 MG 12 hr tablet Take 0.375 mg by mouth 2 (two) times daily.   SYNTHROID 50 MCG tablet TAKE ONE AND ONE-HALF TABLETS DAILY BEFORE BREAKFAST   No facility-administered encounter medications on file as of 11/03/2020.    Follow-up: No follow-ups on file.   Becky Sax, MD

## 2020-11-03 NOTE — Progress Notes (Signed)
Patient need referral to GYN for fibroids with pain and bloating.Patient said that tummy swells and pain in bottom.

## 2020-11-04 ENCOUNTER — Encounter: Payer: Self-pay | Admitting: Family Medicine

## 2020-11-06 ENCOUNTER — Telehealth: Payer: Self-pay | Admitting: Cardiology

## 2020-11-06 NOTE — Telephone Encounter (Signed)
Please advise 

## 2020-11-06 NOTE — Telephone Encounter (Signed)
  Pt is requesting to get a call back from Dr. Ellyn Hack, she said she wants a call back from him and did not provide anymore info

## 2020-11-08 NOTE — Telephone Encounter (Signed)
If there is any clinical question that she would want to answered, I think that is better done in a clinic visit as opposed to a telephone encounter.  Refuses urgent call is best if she leaves messages about the question is so we can triage the importance.  Glenetta Hew, MD

## 2020-11-09 NOTE — Telephone Encounter (Signed)
Returned call to patient-patient requesting excuse from jury duty note.  Patient frustrated as the message was suppose to already be sent to him.  Advised unsure if this will be completed due to being stable from a cardiac standpoint at last visit.   Patient become upset and states she has heart, stomach and thyroid issues and is in constant pain and she cannot sit on jury duty.  She states if Dr. Ellyn Hack denies this she ONLY wants to hear it from him or she will be getting a Chief Executive Officer.    Advised message sent to MD to review.

## 2020-11-09 NOTE — Telephone Encounter (Signed)
Pt Called and stated she is needing a note from Dr Ellyn Hack to excuse her from [facility] 12/6 .  She she can not deal that right now.     Best number [phone]

## 2020-11-09 NOTE — Telephone Encounter (Signed)
  Pt calling back, she finally said her concern, she said she is being called for jury duty, she wanted to ask for a note from Dr. Ellyn Hack stating she can't go since she gets upset stomach and anxious and its bad for her heart

## 2020-11-10 NOTE — Telephone Encounter (Signed)
In the absence of an active cardiac event, I do not think that any note from a doctor will excuse you from jury duty.  The last time there was an ongoing issue.  I would not feel comfortable doing that at this time.  Glenetta Hew, MD

## 2020-11-10 NOTE — Telephone Encounter (Signed)
Spoke to patient. Patient aware Dr Ellyn Hack would not sign  a letter for jury.

## 2020-11-10 NOTE — Telephone Encounter (Signed)
Patient calling back. She wanted to insure her messages were sent to Dr. Ellyn Hack. I informed her all her messages were sent to him. She would like a call back to find out when Dr. Ellyn Hack will call her back.

## 2020-11-10 NOTE — Telephone Encounter (Signed)
Follow Up:      Patient's  husband is calling. He said patient to hear from Dr Ellyn Hack.. She said Dr Ellyn Hack wrote her last excuse from [facility]

## 2020-11-12 DIAGNOSIS — K5904 Chronic idiopathic constipation: Secondary | ICD-10-CM | POA: Diagnosis not present

## 2020-11-12 DIAGNOSIS — K5730 Diverticulosis of large intestine without perforation or abscess without bleeding: Secondary | ICD-10-CM | POA: Diagnosis not present

## 2020-11-12 DIAGNOSIS — K219 Gastro-esophageal reflux disease without esophagitis: Secondary | ICD-10-CM | POA: Diagnosis not present

## 2020-12-22 ENCOUNTER — Telehealth: Payer: Self-pay | Admitting: Cardiology

## 2020-12-22 NOTE — Telephone Encounter (Signed)
Pt c/o medication issue:  1. Name of Medication: rosuvastatin (CRESTOR) 20 MG tablet  2. How are you currently taking this medication (dosage and times per day)? Stopped taking two days ago  3. Are you having a reaction (difficulty breathing--STAT)? yes  4. What is your medication issue? Bad upper abdominal pain after eating causing patient to not be able to eat

## 2020-12-22 NOTE — Telephone Encounter (Signed)
She's been on rosuvastatin since 2018, but if feels better after being off x 2 weeks, we can try a different statin

## 2020-12-22 NOTE — Telephone Encounter (Signed)
Patient reports upper abdominal pain for 1 year. She previously attributed this to IBS. She reports it is so severe that she is impacted by anything she eats, having to take gas pills, maalox around the clock.   She read up on SE of rosuvastatin and it was noted it can cause upper abdominal pain, and she has been pain free yesterday and today since she held the medication   Advised to hold statin for 2 weeks, will send message to MD, pharmD about recommendations and review of possible side effects

## 2020-12-23 ENCOUNTER — Encounter: Payer: Self-pay | Admitting: Obstetrics and Gynecology

## 2020-12-23 ENCOUNTER — Ambulatory Visit (INDEPENDENT_AMBULATORY_CARE_PROVIDER_SITE_OTHER): Payer: Medicare Other | Admitting: Obstetrics and Gynecology

## 2020-12-23 ENCOUNTER — Other Ambulatory Visit: Payer: Self-pay

## 2020-12-23 VITALS — BP 146/84 | HR 78 | Wt 154.9 lb

## 2020-12-23 DIAGNOSIS — D251 Intramural leiomyoma of uterus: Secondary | ICD-10-CM

## 2020-12-23 DIAGNOSIS — D259 Leiomyoma of uterus, unspecified: Secondary | ICD-10-CM | POA: Insufficient documentation

## 2020-12-23 DIAGNOSIS — R140 Abdominal distension (gaseous): Secondary | ICD-10-CM | POA: Diagnosis not present

## 2020-12-23 DIAGNOSIS — D252 Subserosal leiomyoma of uterus: Secondary | ICD-10-CM

## 2020-12-23 NOTE — Progress Notes (Signed)
°  CC; abdominal bloating, fibroids Subjective:    Patient ID: Dominique Barber, female    DOB: 03/19/1955, 65 y.o.   MRN: [id number]  HPI 65 yo female with concerns of abdominal bloating and hx of uterine fibroids.  She has had this bloating for several months, but also has a history of IBS.  Reviewed pelvic u/s and CT scan with patient.  Pt has two small fibroids.  Explained to patient the fibroids are small and would be dormant due to her menopausal state.  The fibroids are very unlikely to be the cause of abdominal distention.  The CT scan supported these findings as well.   Review of Systems     Objective:   Physical Exam Vitals:   12/23/20 1514  BP: (!) 146/84  Pulse: 78   Abdominal exam: diffuse upper abdominal tenderness, no firm or hard abdominal masses noted. Pelvic exam: pt declined bimanual exam today due to IBS flare.     Assessment & Plan:   1. Abdominal bloating Do not believe abdominal bloating has a gyn etiology at this time  2. Intramural and subserous leiomyoma of uterus Fibroids stable in postmenopausal uterus, while preform bimanual exam in 1 month.    Griffin Basil, MD Faculty Attending, Center for [facility]

## 2020-12-23 NOTE — Telephone Encounter (Signed)
Information given to husband. - to do a statin holiday for 2 weeks , restart medication if symptoms return .  Next option to see Lipid clinic  for next non-statin treatment Husband verbalized understanding.

## 2020-12-23 NOTE — Telephone Encounter (Signed)
Somehow, I do not think that this that is was causing her symptoms, but if she feels better not unreasonable to try something different.  Unfortunately, I suspect that she will have the same side effect and we therefore will end up having to do nonstatin therapy.  Glenetta Hew, MD

## 2021-01-07 ENCOUNTER — Other Ambulatory Visit: Payer: Self-pay | Admitting: Cardiology

## 2021-01-25 ENCOUNTER — Ambulatory Visit (INDEPENDENT_AMBULATORY_CARE_PROVIDER_SITE_OTHER): Payer: Medicare Other | Admitting: Obstetrics and Gynecology

## 2021-01-25 ENCOUNTER — Other Ambulatory Visit: Payer: Self-pay

## 2021-01-25 ENCOUNTER — Encounter: Payer: Self-pay | Admitting: Obstetrics and Gynecology

## 2021-01-25 VITALS — BP 146/86 | HR 64 | Ht 62.0 in | Wt 157.1 lb

## 2021-01-25 DIAGNOSIS — R140 Abdominal distension (gaseous): Secondary | ICD-10-CM

## 2021-01-25 DIAGNOSIS — D252 Subserosal leiomyoma of uterus: Secondary | ICD-10-CM

## 2021-01-25 DIAGNOSIS — D251 Intramural leiomyoma of uterus: Secondary | ICD-10-CM | POA: Diagnosis not present

## 2021-01-25 NOTE — Progress Notes (Signed)
°  CC: gyn follow up for fibroids Subjective:    Patient ID: Dominique Barber, female    DOB: 03-10-1955, 66 y.o.   MRN: [id number]  HPI 66 yo patient seen to complete physical exam from previous visit.  She states she continue to have abdominal bloating.   Review of Systems     Objective:   Physical Exam Abdominal:     Comments: Firm mildly distended abdomen  Genitourinary:    Comments: Bimanual exam showed small, mobile uterus, no obvious enlargement or masses  Vitals:   01/25/21 1643  BP: (!) 146/86  Pulse: 64         Assessment & Plan:   1. Intramural and subserous leiomyoma of uterus As per previous note, fibroids are stable and unlikely to be the cause of abdominal bloating.  2. Abdominal bloating Pt advised to follow up with GI regarding bloating and possibly rechecking CT scan since it has been over a year since last procedure.  F/u prn    Griffin Basil, MD Faculty Attending, Center for [facility]

## 2021-01-25 NOTE — Progress Notes (Signed)
Pt states cannot  lay straight on back due to hernia.

## 2021-02-10 ENCOUNTER — Other Ambulatory Visit: Payer: Self-pay | Admitting: Cardiology

## 2021-02-22 ENCOUNTER — Other Ambulatory Visit: Payer: Self-pay | Admitting: Cardiology

## 2021-02-22 NOTE — Telephone Encounter (Signed)
Prescription refill request for Xarelto received.  Indication:Afib Last office visit:9/22 Weight:71.3 kg Age:66 Scr:0.8 CrCl:78.91 ml/min  Prescription refilled

## 2021-03-03 ENCOUNTER — Other Ambulatory Visit: Payer: Self-pay | Admitting: Cardiology

## 2021-03-08 ENCOUNTER — Telehealth: Payer: Self-pay | Admitting: Cardiology

## 2021-03-08 NOTE — Telephone Encounter (Signed)
Spoke with patient who requested to speak with Dr. Ellyn Hack when he returns to office next Monday. I asked her if there was something I could help with and she stated, "No, I'll wait to speak with him. He can help me. Please send him this message." I told her I will send this message to him. She thanked me. ?

## 2021-03-08 NOTE — Telephone Encounter (Signed)
Pt c/o medication issue: ? ?1. Name of Medication:  ? clopidogrel (PLAVIX) 75 MG tablet  ? ?2. How are you currently taking this medication (dosage and times per day)? Would not say  ? ?3. Are you having a reaction (difficulty breathing--STAT)? Yes ? ?4. What is your medication issue? Patient is calling complaining of bloating with pain and gas. She states her stomach Doctor put her on a medication for it and it has not helped. She reports she looks 6 months pregnant. She is only wanting to speak with Dr. Ellyn Hack directly in regards to this stating it is her legal right to speak with him on the phone if she wants to. She does not want a nurse to call her back due to stating she has no way to know they consulted with him in regards to what they advise her unless he tells her that himself. Informed patient Dr. Ellyn Hack is at the hospital this week and won't be back in office until Monday. Stated she does not care and she should be called by him and that if she does not speak with her Doctor she is filling a complaint. Please advise.  ?

## 2021-03-08 NOTE — Telephone Encounter (Signed)
LMTCB

## 2021-03-08 NOTE — Telephone Encounter (Signed)
Judson Roch, I think if Ivin Booty is around, may be have Ivin Booty give her a call.  She knows [place] very well. ? ?Glenetta Hew, MD ? ?

## 2021-03-16 DIAGNOSIS — E739 Lactose intolerance, unspecified: Secondary | ICD-10-CM | POA: Diagnosis not present

## 2021-03-16 DIAGNOSIS — Z86010 Personal history of colonic polyps: Secondary | ICD-10-CM | POA: Diagnosis not present

## 2021-03-16 DIAGNOSIS — K219 Gastro-esophageal reflux disease without esophagitis: Secondary | ICD-10-CM | POA: Diagnosis not present

## 2021-03-16 DIAGNOSIS — K5904 Chronic idiopathic constipation: Secondary | ICD-10-CM | POA: Diagnosis not present

## 2021-03-16 DIAGNOSIS — K5730 Diverticulosis of large intestine without perforation or abscess without bleeding: Secondary | ICD-10-CM | POA: Diagnosis not present

## 2021-03-16 DIAGNOSIS — R140 Abdominal distension (gaseous): Secondary | ICD-10-CM | POA: Diagnosis not present

## 2021-03-30 ENCOUNTER — Telehealth: Payer: Self-pay | Admitting: Cardiology

## 2021-03-30 NOTE — Telephone Encounter (Signed)
Per Ivin Booty, RN: pt has appt scheduled continue until scheduled appointment to discuss stopping. Pt raised her voice and demanded to speak with "the person in charge" informed pt that I will forward to her. "Why can't Dr Ellyn Hack call to discuss this like I keep asking" informed pt that Dr Allison Quarry nurse informed that she needs to do this at an appointment. I do not see that she has called to discuss this. I will forward to supervisor. I offered to send a refill to last until upcoming appointment. She states that she does not want a refill and does not want to wait until scheduled appointment.  ?

## 2021-03-30 NOTE — Telephone Encounter (Signed)
Discussed above message with Dr Ellyn Hack, he states that he could possibly do a telephone visit Thursday 11am "after his scheduled CATH", he will fit her in if not available after his CATH in the afternoon 2pm. ?Called pt back she states that is fine as long as she gets to speak with Dr Ellyn Hack. She is also stating that "last time she had a telephone visit her insurance did not cover, informed pt that she should call her insurance and discuss this. I have sent a message to pre-cert to see. ? ?

## 2021-03-30 NOTE — Telephone Encounter (Signed)
Returned call to pt informed of Dr Allison Quarry message OK to stop Plavix. She states ok but I need him to call me "I have many questions that I need to ask him" informed pt that she could ask them at scheduled appt. She states "no, he will need to call me back as I have ask many times". Will forward message. ?

## 2021-03-30 NOTE — Telephone Encounter (Signed)
Called pt she states that she cannot come on Friday her husband has an appt (offered 120 and 240pm). Pt states "I do not know why he is not calling me after hours" will forward message. ?

## 2021-03-30 NOTE — Telephone Encounter (Signed)
If she is so adamant- I am fine with stopping Plavix - she is on Xarelto as well. ? ?Plan was to reassess in January - but I haven't seen her.  ? ?Glenetta Hew, MD ? ?

## 2021-03-30 NOTE — Telephone Encounter (Signed)
More than simple medicine questions requires medical decision making that I do not feel comfortable making over the phone.   ?Maybe we can get her into one of my open slots on Friday or Monday.   ?Playing phone-pong doesn't work.  I don't have time during work days to call her. Just not feasible. ? ?Glenetta Hew, MD ? ?

## 2021-03-30 NOTE — Telephone Encounter (Signed)
Patient called and said that she is calling about her Plavix. Said that she was only supposed to be on it for a year and its been over a year. Says that she has been leaving messages for Dr. Ellyn Hack or nurse to call her and no one has been calling her back and she is not even sure that he has been getting them. Patient would like a call back asap ?

## 2021-04-01 ENCOUNTER — Telehealth: Payer: Medicare Other | Admitting: Cardiology

## 2021-04-01 ENCOUNTER — Ambulatory Visit (INDEPENDENT_AMBULATORY_CARE_PROVIDER_SITE_OTHER): Payer: Medicare Other | Admitting: Cardiology

## 2021-04-01 ENCOUNTER — Encounter: Payer: Self-pay | Admitting: Cardiology

## 2021-04-01 VITALS — BP 139/86 | HR 66 | Ht 62.0 in | Wt 153.0 lb

## 2021-04-01 DIAGNOSIS — E785 Hyperlipidemia, unspecified: Secondary | ICD-10-CM

## 2021-04-01 DIAGNOSIS — I25710 Atherosclerosis of autologous vein coronary artery bypass graft(s) with unstable angina pectoris: Secondary | ICD-10-CM | POA: Diagnosis not present

## 2021-04-01 DIAGNOSIS — R140 Abdominal distension (gaseous): Secondary | ICD-10-CM

## 2021-04-01 DIAGNOSIS — I255 Ischemic cardiomyopathy: Secondary | ICD-10-CM

## 2021-04-01 DIAGNOSIS — I480 Paroxysmal atrial fibrillation: Secondary | ICD-10-CM | POA: Diagnosis not present

## 2021-04-01 DIAGNOSIS — I25119 Atherosclerotic heart disease of native coronary artery with unspecified angina pectoris: Secondary | ICD-10-CM

## 2021-04-01 DIAGNOSIS — I10 Essential (primary) hypertension: Secondary | ICD-10-CM | POA: Diagnosis not present

## 2021-04-01 MED ORDER — NITROGLYCERIN 0.4 MG SL SUBL: 0.4000 mg | SUBLINGUAL_TABLET | SUBLINGUAL | 3 refills | Status: DC | PRN

## 2021-04-01 NOTE — Assessment & Plan Note (Signed)
No symptoms to suggest recurrence of A-fib. ?Tolerating milligrams of Toprol twice daily as opposed to once daily higher dose.  Less fatigue and less hypotension. ?Energy level better. ? ?Doing well on Xarelto. ?? Okay to hold Xarelto 48-72 hours preop for surgical procedures. ? ? ? ?Preop for surgeries or procedures. ?

## 2021-04-01 NOTE — Assessment & Plan Note (Signed)
Non-STEMI native CAD.  Reviewed above. ?No active angina. ?Most of her symptoms now are GI related. ? ?Plan: ?? Continue Toprol 25 mg twice daily ?? Once her GI issues settle down, I want to restart rosuvastatin. ?? She is now out 1 year from her PCI, okay to stop Plavix and continue Xarelto. ?

## 2021-04-01 NOTE — Progress Notes (Signed)
? ?Virtual Visit via Telephone Note  ? ?This visit type was conducted due to national recommendations for restrictions regarding the COVID-19 Pandemic (e.g. social distancing) in an effort to limit this patient's exposure and mitigate transmission in our community.  Due to her co-morbid illnesses, this patient is at least at moderate risk for complications without adequate follow up.  This format is felt to be most appropriate for this patient at this time.  The patient did not have access to video technology/had technical difficulties with video requiring transitioning to audio format only (telephone).  All issues noted in this document were discussed and addressed.  No physical exam could be performed with this format.  Please refer to the patient's chart for her  consent to telehealth for [facility].  ? ?Patient has given verbal permission to conduct this visit via virtual appointment and to bill insurance 04/01/2021 5:43 PM    ? ?Evaluation Performed:  Follow-up visit ? ?Date:  04/01/2021  ? ?ID:  Dominique Barber, DOB 09-08-1955, MRN [id number] ? ?Patient Location: Home ?Provider Location: Office/Clinic ? ?PCP:  Dorna Mai, MD  ?Cardiologist:  Glenetta Hew, MD  ?Electrophysiologist:  None  ? ?Chief Complaint:   ?Chief Complaint  ?Patient presents with  ? Follow-up  ?  Discuss medications and questions  ? ? ?==================================== ? ?ASSESSMENT & PLAN:   ? ?Problem List Items Addressed This Visit   ? ?  ? Cardiology Problems  ? Native CAD- 100% LAD, s/p SVG-LAD after failed LIMA-LAD (Chronic)  ?  Non-STEMI native CAD.  Reviewed above. ?No active angina. ?Most of her symptoms now are GI related. ? ?Plan: ?Continue Toprol 25 mg twice daily ?Once her GI issues settle down, I want to restart rosuvastatin. ?She is now out 1 year from her PCI, okay to stop Plavix and continue Xarelto. ?  ?  ? Relevant Medications  ? nitroGLYCERIN (NITROSTAT) 0.4 MG SL tablet  ? Ischemic cardiomyopathy (Chronic)   ?  Mildly reduced EF but with no CHF symptoms. ?Only on low-dose Toprol.  Has not not tolerating further medication. ?  ?  ? Relevant Medications  ? nitroGLYCERIN (NITROSTAT) 0.4 MG SL tablet  ? Coronary artery disease involving autologous vein coronary bypass graft with unstable angina pectoris (HCC) (Chronic)  ?  Of her initial grafts, She only has the SVG LAD left which has been treated with stents. ?SVG to diagonal branch is occluded however the diagonal is giving collaterals from the downstream diagonals fill via the SVG graft. ? ?With last PCI in January 2022, would probably not be due for ischemic evaluation until about 2026. ?  ?  ? Relevant Medications  ? nitroGLYCERIN (NITROSTAT) 0.4 MG SL tablet  ? Hyperlipidemia with target LDL less than 70 (Chronic)  ?  She was post have labs checked in January.  Unfortunately she did not realize that she could come in to get that done.  We will therefore have him scheduled to be checked shortly before her May follow-up visit. ? ?Last year her lipids look great.  She was on Crestor, but now she has held it for couple months.  I would like to see where she is prior to knowing how to reinitiate therapy. ? ?Once her GI issues resolve or improve, I would like for her to rechallenge herself with statin because I do not think it was exacerbating anything. ?  ?  ? Relevant Medications  ? nitroGLYCERIN (NITROSTAT) 0.4 MG SL tablet  ? Other Relevant Orders  ?  Lipid Profile  ? Paroxysmal atrial fibrillation ([place]); CHA2DS2-VASc Score 3. On Xarelto (Chronic)  ?  No symptoms to suggest recurrence of A-fib. ?Tolerating milligrams of Toprol twice daily as opposed to once daily higher dose.  Less fatigue and less hypotension. ?Energy level better. ? ?Doing well on Xarelto. ?Okay to hold Xarelto 48-72 hours preop for surgical procedures. ? ? ? ?Preop for surgeries or procedures. ?  ?  ? Relevant Medications  ? nitroGLYCERIN (NITROSTAT) 0.4 MG SL tablet  ? Other Relevant Orders  ? Comp  Met (CMET)  ? CBC  ? Essential hypertension (Chronic)  ?  Actually today's or the only time that seen her hypertensive.  She has had low pressures in the 90s, therefore we have not done much more than 25 mg twice daily Toprol. ?Reassess in follow-up. ?  ?  ? Relevant Medications  ? nitroGLYCERIN (NITROSTAT) 0.4 MG SL tablet  ? Other Relevant Orders  ? Comp Met (CMET)  ? CBC  ?  ? Other  ? Abdominal bloating - Primary  ?  I was happy to hear that her gastroenterologist agreed to write a letter for jury duty issues.  Since most of her symptoms are GI related, I feel that it is most prudent that the gastroenterologist handles this. ?  ?  ? ? ?==================================== ? ?History of Present Illness:   ? ?Dominique Barber is a 66 y.o. female with PMH notable for CAD-CABG-redo CABG and now with vein graft PCI along with HTN, HLD and PAF along with intractable IBS symptoms who presents via audio/video conferencing for a telehealth visit today as a early follow-up to discuss ongoing symptoms. ? ?Cardiac History: ?CAD: ?STEMI 1998 w/ VR Arrest & ~ anoxic brain injury -> status epilepticus -> POBA-LAD --> 1 month later returned with [place] --> 95% re-stenosis --> BMS-LAD (Dr. Rollene Fare) ?7/'98 Recurrent Angina -> severe ISR --> CABG x 2 (LIMA-LAD, SVG-D1) => emergent re-operation post-OP --> LIMA occlusion --> SVG-LAD. ?Jan 2005: Cath -> patent grafts & normal Dom LCx, non-dome RCA. Small D1). ?Jan 02, 2020: Admitted with Afib-RVR & Demand Ischemia (+ Trop & lat TWI) - planned OP Myoview ?Jan 04 2020 - ER with CP (left after 12 hr & not seen) --> EMS came out on Jan 2, EKG no longer had TWI.  ?Jan 06, 2020 -> returned to ER with recurrent ACS Sx --> Cath ?Jan 08, 2020 Cath-PCI SVG-LAD, SVG-Diag CTO. Stable native Dom LCx & non-dome RCA ?PAF -Most recent episode Dec 2021 -> converted with IV Diltiazem ?CRFs: HTN, HLD;  COPD, GERD,   ?** IBS has been flaring up since Dec 2021 (mostly notes epigastric pain, bloating,  early satiety & nausea ? ?Dominique Barber was last seen on September 14, 2020 along with her husband has become a very prominent member in discussions (oftentimes not letting her speak).  At that time she really only complain about stomach issues.  Having issues with tolerating her diet.  Gaining weight from bloating.  Noted that sometimes he gets bloated to the point of getting dyspneic.  Otherwise, when she is not significantly symptomatic, she does do her routine exercise which seems basically includes house chores and walking.  She may get some dyspnea with vacuuming, but has not had any further chest pain or pressure.  Rare palpitations.  No energy because he cannot do much because of GI issues. ? ?Hospitalizations:  ?None ? ?Recent - Interim CV studies:   ? ? ?The following studies were  reviewed today: ?No new studies.:  Most recent Echo Was Reviewed below. ? ?Inerval History  ? ?Dominique Barber has had multiple calls into the clinic where she is demanding to speak to me.  Triage nurse tried to get her to reveal nature of her questions and concerns or symptoms.  The only 1 thing she did mention her was that she was hoping to be off of Plavix.  I indicated that the plan was for her to stop Plavix after a year. ? ?She stopped taking her rosuvastatin.  She was thinking that it was making her abdominal pains worse, but she is still having abdominal pains, and is now wondering if that had anything to do with it at all. ? ?The main gist of our conversation was how frustrated she is with all of her GI symptoms abdominal pain and constipation than loose stools.  She was called for jury duty and she was hoping that I would build to help her out with a letter excusing her from jury duty in the interim of trying to wait to talk to me she was able to see her GI doctor it was notably distressed at the level of the distention of Dominique Barber's abdomen, she switched the Linzess to a new medication, and they agreed to write a  letter to clear her from jury duty.  She said that the jury duty people were very nasty to her and several of them lives can reschedule her for another time etc. ? ?We very briefly spoke of her cardiac sym

## 2021-04-01 NOTE — Assessment & Plan Note (Signed)
Actually today's or the only time that seen her hypertensive.  She has had low pressures in the 90s, therefore we have not done much more than 25 mg twice daily Toprol. ?Reassess in follow-up. ?

## 2021-04-01 NOTE — Assessment & Plan Note (Signed)
Mildly reduced EF but with no CHF symptoms. ?Only on low-dose Toprol.  Has not not tolerating further medication. ?

## 2021-04-01 NOTE — Patient Instructions (Addendum)
Medication Instructions:  ? ?Stop Plavix when current bottle complete. ? ?*If you need a refill on your cardiac medications before your next appointment, please call your pharmacy* ? ? ?Lab Work: ? ?(Prior to next visit) ?Fasting Lipids, CMP, CBC ?Labs are done on a walk in basis at the [facility] office- DO NOT eat or drink anything for 8 hours prior to your lab draw except for water or black coffee (no cream/ sugar) are fine.  ? ?If you have labs (blood work) drawn today and your tests are completely normal, you will receive your results only by: ?MyChart Message (if you have MyChart) OR ?A paper copy in the mail ?If you have any lab test that is abnormal or we need to change your treatment, we will call you to review the results. ? ? ?Testing/Procedures: ?none ? ? ?Follow-Up: ?At [facility], you and your health needs are our priority.  As part of our continuing mission to provide you with exceptional heart care, we have created designated Provider Care Teams.  These Care Teams include your primary Cardiologist (physician) and Advanced Practice Providers (APPs -  Physician Assistants and Nurse Practitioners) who all work together to provide you with the care you need, when you need it. ? ?We recommend signing up for the patient portal called "MyChart".  Sign up information is provided on this After Visit Summary.  MyChart is used to connect with patients for Virtual Visits (Telemedicine).  Patients are able to view lab/test results, encounter notes, upcoming appointments, etc.  Non-urgent messages can be sent to your provider as well.   ?To learn more about what you can do with MyChart, go to [email or web address].   ? ?Your next appointment:   ?05/10/2021 - 2PM @ [place]  ? ?The format for your next appointment:   ?In Person ? ?Provider:   ?Glenetta Hew, MD   ? ? ?Other Instructions ? ?Come in 1 week before your visit to get blood drawn for Fasting Cholesterol levels, chemistries & blood  counts. ? ?

## 2021-04-01 NOTE — Assessment & Plan Note (Signed)
She was post have labs checked in January.  Unfortunately she did not realize that she could come in to get that done.  We will therefore have him scheduled to be checked shortly before her May follow-up visit. ? ?Last year her lipids look great.  She was on Crestor, but now she has held it for couple months.  I would like to see where she is prior to knowing how to reinitiate therapy. ? ?Once her GI issues resolve or improve, I would like for her to rechallenge herself with statin because I do not think it was exacerbating anything. ?

## 2021-04-01 NOTE — Assessment & Plan Note (Signed)
I was happy to hear that her gastroenterologist agreed to write a letter for jury duty issues.  Since most of her symptoms are GI related, I feel that it is most prudent that the gastroenterologist handles this. ?

## 2021-04-01 NOTE — Assessment & Plan Note (Signed)
Of her initial grafts, She only has the SVG LAD left which has been treated with stents. ?SVG to diagonal branch is occluded however the diagonal is giving collaterals from the downstream diagonals fill via the SVG graft. ? ?With last PCI in January 2022, would probably not be due for ischemic evaluation until about 2026. ?

## 2021-05-04 DIAGNOSIS — I10 Essential (primary) hypertension: Secondary | ICD-10-CM | POA: Diagnosis not present

## 2021-05-04 DIAGNOSIS — E785 Hyperlipidemia, unspecified: Secondary | ICD-10-CM | POA: Diagnosis not present

## 2021-05-04 DIAGNOSIS — I480 Paroxysmal atrial fibrillation: Secondary | ICD-10-CM | POA: Diagnosis not present

## 2021-05-05 LAB — COMPREHENSIVE METABOLIC PANEL
ALT: 15 IU/L (ref 0–32)
AST: 12 IU/L (ref 0–40)
Albumin/Globulin Ratio: 1.8 (ref 1.2–2.2)
Albumin: 4.4 g/dL (ref 3.8–4.8)
Alkaline Phosphatase: 103 IU/L (ref 44–121)
BUN/Creatinine Ratio: 12 (ref 12–28)
BUN: 13 mg/dL (ref 8–27)
Bilirubin Total: 0.4 mg/dL (ref 0.0–1.2)
CO2: 26 mmol/L (ref 20–29)
Calcium: 9.8 mg/dL (ref 8.7–10.3)
Chloride: 103 mmol/L (ref 96–106)
Creatinine, Ser: 1.1 mg/dL — ABNORMAL HIGH (ref 0.57–1.00)
Globulin, Total: 2.5 g/dL (ref 1.5–4.5)
Glucose: 107 mg/dL — ABNORMAL HIGH (ref 70–99)
Potassium: 4.6 mmol/L (ref 3.5–5.2)
Sodium: 140 mmol/L (ref 134–144)
Total Protein: 6.9 g/dL (ref 6.0–8.5)
eGFR: 56 mL/min/{1.73_m2} — ABNORMAL LOW (ref >59)

## 2021-05-05 LAB — LIPID PANEL
Chol/HDL Ratio: 3.6 ratio (ref 0.0–4.4)
Cholesterol, Total: 204 mg/dL — ABNORMAL HIGH (ref 100–199)
HDL: 56 mg/dL (ref >39)
LDL Chol Calc (NIH): 128 mg/dL — ABNORMAL HIGH (ref 0–99)
Triglycerides: 113 mg/dL (ref 0–149)
VLDL Cholesterol Cal: 20 mg/dL (ref 5–40)

## 2021-05-05 LAB — CBC
Hematocrit: 35.2 % (ref 34.0–46.6)
Hemoglobin: 11.7 g/dL (ref 11.1–15.9)
MCH: 30.2 pg (ref 26.6–33.0)
MCHC: 33.2 g/dL (ref 31.5–35.7)
MCV: 91 fL (ref 79–97)
Platelets: 259 10*3/uL (ref 150–450)
RBC: 3.88 x10E6/uL (ref 3.77–5.28)
RDW: 13.1 % (ref 11.7–15.4)
WBC: 6.4 10*3/uL (ref 3.4–10.8)

## 2021-05-10 ENCOUNTER — Encounter: Payer: Self-pay | Admitting: Cardiology

## 2021-05-10 ENCOUNTER — Ambulatory Visit (INDEPENDENT_AMBULATORY_CARE_PROVIDER_SITE_OTHER): Payer: Medicare Other | Admitting: Cardiology

## 2021-05-10 VITALS — BP 130/80 | HR 72 | Ht 62.0 in | Wt 157.6 lb

## 2021-05-10 DIAGNOSIS — I25119 Atherosclerotic heart disease of native coronary artery with unspecified angina pectoris: Secondary | ICD-10-CM

## 2021-05-10 DIAGNOSIS — Z720 Tobacco use: Secondary | ICD-10-CM

## 2021-05-10 DIAGNOSIS — I2102 ST elevation (STEMI) myocardial infarction involving left anterior descending coronary artery: Secondary | ICD-10-CM | POA: Diagnosis not present

## 2021-05-10 DIAGNOSIS — E785 Hyperlipidemia, unspecified: Secondary | ICD-10-CM

## 2021-05-10 DIAGNOSIS — I10 Essential (primary) hypertension: Secondary | ICD-10-CM

## 2021-05-10 DIAGNOSIS — Z951 Presence of aortocoronary bypass graft: Secondary | ICD-10-CM

## 2021-05-10 DIAGNOSIS — I255 Ischemic cardiomyopathy: Secondary | ICD-10-CM | POA: Diagnosis not present

## 2021-05-10 DIAGNOSIS — I480 Paroxysmal atrial fibrillation: Secondary | ICD-10-CM

## 2021-05-10 DIAGNOSIS — I25710 Atherosclerosis of autologous vein coronary artery bypass graft(s) with unstable angina pectoris: Secondary | ICD-10-CM

## 2021-05-10 NOTE — Patient Instructions (Addendum)
Medication Instructions:  ? ?No changes ?*If you need a refill on your cardiac medications before your next appointment, please call your pharmacy* ? ? ?Lab Work: ?Not needed ? ? ? ?Testing/Procedures: ? ?notneeded ? ?Follow-Up: ?At [facility], you and your health needs are our priority.  As part of our continuing mission to provide you with exceptional heart care, we have created designated Provider Care Teams.  These Care Teams include your primary Cardiologist (physician) and Advanced Practice Providers (APPs -  Physician Assistants and Nurse Practitioners) who all work together to provide you with the care you need, when you need it. ? ?  ? ?Your next appointment:   ? 6 to 7 month(s) ? ?The format for your next appointment:   ?In Person ? ?Provider:   ?Glenetta Hew, MD  ? ?You have been referred to CVRR  cholesterol   (labs already done.)  ? ? ? ?

## 2021-05-10 NOTE — Progress Notes (Signed)
? ?Primary Care Provider: Dorna Mai, MD ?Cardiologist: Glenetta Hew, MD ?Electrophysiologist: None ? ?Clinic Note: ?Chief Complaint  ?Patient presents with  ?? Follow-up  ?  GI issues seem to be doing better.  She stopped taking Crestor-citing worsening GI issues and myalgias.  ?=================================== ? ?ASSESSMENT/PLAN  ? ?Problem List Items Addressed This Visit   ? ?  ? Cardiology Problems  ? Native CAD- 100% LAD, s/p SVG-LAD after failed LIMA-LAD - Primary (Chronic)  ?  History of STEMI.  Also STEMI.  No Active Angina.  Mostly GI Symptoms. ? ?Plan: ?Continue Toprol 25 mg twice daily-spreading out the original 50 mg tab to avoid hypotension ?After 1 year post PCI, Plavix was discontinued and the continuing Xarelto. ?No longer on statin in setting intolerance with GI upset and myalgias. ?Not on ARB because of historical hypotension. ? ?  ?  ? Relevant Orders  ? AMB Referral to [facility] Pharm-D  ? Ischemic cardiomyopathy (Chronic)  ?  Mildly reduced EF.  No CHF symptoms. ?On Toprol at low-dose but has been historically hypotensive and therefore on ARB. ? ?  ?  ? Coronary artery disease involving autologous vein coronary bypass graft with unstable angina pectoris (HCC) (Chronic)  ?  Now dependent on SVG-LAD treated with DES.  Occluded SVG to diagonal. ? ?Patient will symptoms, would probably consider Myoview stress test in 2025 - 2026 timeframe. ? ? ? ?  ?  ? Relevant Orders  ? AMB Referral to [facility] Pharm-D  ? History of ST elevation (STEMI) myocardial infarction involving left anterior descending coronary artery (HCC) (Chronic)  ?  History history of STEMI complicated by VF arrest with mild anoxic brain injury-likely status epilepticus.  Initially had both of the LAD.  Followed by secondary MRI acute closure of LIMA graft.  Ended up with redo CABG with SVG to LAD. ? ? ?Despite this, EF has remained relatively stable, and had done well until her most recent PCI in early January 2022.. ? ?  ?   ? Hyperlipidemia with target LDL less than 70 (Chronic)  ?  Her lipids, her LDL has gone totally the wrong direction.  She has stopped taking her Crestor, claiming it was exacerbating her GI issues. ?Also notices myalgias with it. ? ?Based on her significant existing CAD and graft disease, we need more aggressive lipid management. ?We will refer to CV[facility] pharmacist to consider the possibility of PCSK9 inhibitor versus potentially inclisiran. ? ?  ?  ? Relevant Orders  ? AMB Referral to [facility] Pharm-D  ? Paroxysmal atrial fibrillation ([place]); CHA2DS2-VASc Score 3. On Xarelto (Chronic)  ?  Seemingly no symptoms of A-fib recently.  Doing better with the split up Toprol twice daily as opposed to 50 mg daily.  Less fatigue and hypotension. ?No bleeding on Xarelto ? ? ? ?  ?  ? Essential hypertension (Chronic)  ?  Blood pressure controlled.  Less hypotension issues having adjusted Toprol dosing to 25 twice daily ? ?  ?  ?  ? Other  ? S/P CABG x 2 (Chronic)  ? Relevant Orders  ? AMB Referral to [facility] Pharm-D  ? Tobacco abuse (Chronic)  ?  Smokes few cigarettes a day, I think this is her cardiopulmonary greater than with IBS.  I stressed importance of trying to quit, but she is not interested in quitting.  ? ?  ?  ? ?=================================== ? ?HPI:   ? ?Dominique Barber is a 66 y.o. female with a PMH below who presents  today for 2 to 76-monthfollow-up.  She follows up at the request of WDorna Mai MD. ? ?Cardiac History: ?CAD: ?STEMI 1998 w/ VR Arrest & ~ anoxic brain injury -> status epilepticus -> POBA-LAD --> 1 month later [place] with [place]--> 95% re-stenosis --> BMS-LAD (Dr. WRollene Fare ?7/'98 Recurrent Angina -> severe ISR --> CABG x 2 (LIMA-LAD, SVG-D1) => emergent re-operation post-OP --> LIMA occlusion --> SVG-LAD. ?Jan 2005: Cath -> patent grafts & normal Dom LCx, non-dome RCA. Small D1). ?Jan 02, 2020: Admitted with Afib-RVR & Demand Ischemia (+ Trop & lat TWI) - planned OP Myoview ?Jan 04 2020 - ER with CP (left after 12 hr & not seen) --> EMS came out on Jan 2, EKG no longer had TWI.  ?Jan 06, 2020 -> returned to ER with recurrent ACS Sx --> Cath ?Jan 08, 2020 Cath-PCI SVG-LAD, SVG-Diag CTO. Stable native Dom LCx & non-dome RCA ?PAF -Most recent episode Dec 2021 -> converted with IV Diltiazem ?CRFs: HTN, HLD;  COPD, GERD,   ?** IBS has been flaring up since Dec 2021 (mostly notes epigastric pain, bloating, early satiety & nausea ? ?HMAILE LINFORDwas last seen on 04/01/2021 via telemedicine: This was mostly to discuss her multiple various complaints abdominal pain and wondering if there is any way she could have a letter written to excuse her from jury duty.  She was very irate the fact that she was expected to go to jury duty with her ongoing GI issues.  Despite all of her GI issues, and pre-PCI related epigastric/chest pain symptoms that she had before.  Most resolved related to her IBS and bloating.  She does say that all of this started after her bypass surgery. ?In the end, it became clear that her gastroenterologist did write a letter for her.  No changes made cardiac standpoint. ? ?Recent Hospitalizations: None ? ?Reviewed  CV studies:   ? ?The following studies were reviewed today: (if available, images/films reviewed: From Epic Chart or Care Everywhere) ?None: ? ?Interval History:  ? ?Dominique PAREpresents here today as usual with her husband who does become the major speaker is for her discussion of symptoms.  She continues to have abdominal pain but is even doing better with the Linzess.  She is noting less bloating but still having the gas and bloatedness.  This is not as significant.  She has had to eat much smaller less frequent meals.  When doing this, she has been able to maintain a lower level of discomfort. ? ?Thankfully, she is not having any cardiac issues such as rapid or irregular heartbeats/palpitations.  No syncope or near syncope.  She has not had any of the  epigastric discomfort radiating to the chest that was her anginal equivalent at the time of her last PCI. ?No PND, orthopnea or edema. ? ?Stopped taking her rosuvastatin thinking this was making her GI issues worse.  Also having myalgias. ? ?CV Review of Symptoms (Summary) ?Cardiovascular ROS: no chest pain or dyspnea on exertion ?positive for - -weight gain, decreased energy with exercise intolerance.  Not as active. ?negative for - edema, irregular heartbeat, orthopnea, palpitations, paroxysmal nocturnal dyspnea, rapid heart rate, shortness of breath, or syncope/near syncope or TIA/amaurosis fugax, claudication- -only notes dyspnea with worsening bloating ? ?REVIEWED OF SYSTEMS  ? ?Review of Systems  ?Constitutional:  Positive for malaise/fatigue. Negative for weight loss (Weight gain).  ?HENT:  Negative for nosebleeds.   ?Respiratory:  Positive for shortness of breath (Only  with abdominal bloating).   ?Cardiovascular:   ?     Per HPI  ?Gastrointestinal:  Positive for abdominal pain, constipation, nausea and vomiting. Negative for blood in stool and melena.  ?     Still has bloating and belching.  ?Genitourinary:  Negative for hematuria.  ?Musculoskeletal:  Positive for myalgias. Negative for joint pain.  ?Neurological:  Positive for dizziness (Off-and-on if she has not eaten enough). Negative for focal weakness.  ?Psychiatric/Behavioral:  Positive for depression (Anhedonia, depressed mood, poor appetite, poor sleep and anxiety). Negative for memory loss. The patient is nervous/anxious and has insomnia.   ?All other systems reviewed and are negative. ? ?I have reviewed and (if needed) personally updated the patient's problem list, medications, allergies, past medical and surgical history, social and family history.  ? ?PAST MEDICAL HISTORY  ? ?Past Medical History:  ?Diagnosis Date  ?? ACS (acute coronary syndrome) ([place]) 01/06/2020  ? With progressive angina while troponin elevation--borderline-NON-STEMI  ??  ANTERIOR STEMI of LAD (x2). 01/19/1996  ? Complicated by VF arrest, and anoxic brain injury with status epilepticus; POBA of LAD --> 6 months later, after 2 vessel CABG she had postop complication with occluded LIMA

## 2021-05-16 ENCOUNTER — Encounter: Payer: Self-pay | Admitting: Cardiology

## 2021-05-16 NOTE — Assessment & Plan Note (Signed)
History of STEMI.  Also STEMI.  No Active Angina.  Mostly GI Symptoms. ? ?Plan: ?? Continue Toprol 25 mg twice daily-spreading out the original 50 mg tab to avoid hypotension ?? After 1 year post PCI, Plavix was discontinued and the continuing Xarelto. ?? No longer on statin in setting intolerance with GI upset and myalgias. ?? Not on ARB because of historical hypotension. ? ?

## 2021-05-16 NOTE — Assessment & Plan Note (Signed)
Blood pressure controlled.  Less hypotension issues having adjusted Toprol dosing to 25 twice daily ?

## 2021-05-16 NOTE — Assessment & Plan Note (Signed)
Mildly reduced EF.  No CHF symptoms. ?On Toprol at low-dose but has been historically hypotensive and therefore on ARB. ?

## 2021-05-16 NOTE — Assessment & Plan Note (Signed)
Now dependent on SVG-LAD treated with DES.  Occluded SVG to diagonal. ? ?Patient will symptoms, would probably consider Myoview stress test in 2025 - 2026 timeframe. ? ? ?

## 2021-05-16 NOTE — Assessment & Plan Note (Signed)
Smokes few cigarettes a day, I think this is her cardiopulmonary greater than with IBS.  I stressed importance of trying to quit, but she is not interested in quitting.  ?

## 2021-05-16 NOTE — Assessment & Plan Note (Signed)
Seemingly no symptoms of A-fib recently.  Doing better with the split up Toprol twice daily as opposed to 50 mg daily.  Less fatigue and hypotension. ?No bleeding on Xarelto ? ? ?

## 2021-05-16 NOTE — Assessment & Plan Note (Signed)
History history of STEMI complicated by VF arrest with mild anoxic brain injury-likely status epilepticus.  Initially had both of the LAD.  Followed by secondary MRI acute closure of LIMA graft.  Ended up with redo CABG with SVG to LAD. ? ? ?Despite this, EF has remained relatively stable, and had done well until her most recent PCI in early January 2022.Marland Kitchen ?

## 2021-05-16 NOTE — Assessment & Plan Note (Signed)
Her lipids, her LDL has gone totally the wrong direction.  She has stopped taking her Crestor, claiming it was exacerbating her GI issues. ?Also notices myalgias with it. ? ?Based on her significant existing CAD and graft disease, we need more aggressive lipid management. ?We will refer to [facility] pharmacist to consider the possibility of PCSK9 inhibitor versus potentially inclisiran. ?

## 2021-05-19 ENCOUNTER — Encounter: Payer: Self-pay | Admitting: Pharmacist Clinician (PhC)/ Clinical Pharmacy Specialist

## 2021-05-19 ENCOUNTER — Ambulatory Visit (INDEPENDENT_AMBULATORY_CARE_PROVIDER_SITE_OTHER): Payer: Medicare Other | Admitting: Pharmacist Clinician (PhC)/ Clinical Pharmacy Specialist

## 2021-05-19 DIAGNOSIS — E785 Hyperlipidemia, unspecified: Secondary | ICD-10-CM

## 2021-05-19 DIAGNOSIS — I255 Ischemic cardiomyopathy: Secondary | ICD-10-CM

## 2021-05-19 NOTE — Progress Notes (Signed)
05/19/2021 ?Dominique Barber ?1955-07-13 ?[id number] ? ? ?HPI:  Dominique Barber is a 66 y.o. female patient of Dr Ellyn Hack, who presents today for a lipid clinic evaluation.  See pertinent past medical history below.  She was originally seen by Dr. Rollene Fare for many years and now follows with Dr. Ellyn Hack.  She has had ongoing GI problems since her STEMI back in 1998, notes that she has to take Linzess, simethecone and maalox on a daily basis because of bloating and abdominal pain.  Notes that taking oral medications often makes this worse.   She is in the office today with her husband to discuss further options.  ? ?Past Medical History: ?CAD 1/98 -STEMI; 2/98 BMS to prox LAD; 7/98 CABG x 2;1/22 ACS borderline NSTEMI  ?AF CHADS-VASc 5 - on Xarelto, metoprolol  ?HTN Elevated today, on metoprolol  ?CHF 1/22 EF 40-45% at heart cath  ? ? ?Current Medications: none ? ?Cholesterol Goals: LDL < 70 ?  ?Intolerant/previously tried: atorvastatin, rosuvastatin - worsened GI issues, myalgias ? ?Family history: mgm died at 1, several maternal aunts and uncles all died in their 47's as well; mother didn't have any heart issues, had dementia, DM, died in her 44's; father lung cancer - smoker died at 42; 1 brother w/o heart issues; 2 living children - one with seizure disorder, no cholesterol issues known ? ?Diet: doesn't eat much; has problems with GI/bloating since bypass; maybe 1-1.5 meals per day; oatmeal in the monings; sandwiches/soups in small amounts in the day; IBS issues more constipation; linzess, simethicone, maalox ? ?Exercise:  housework ? ?Labs:  05/04/21:  TC 204, TG 113, HDL 56, LDL 128 ? ? ?Current Outpatient Medications  ?Medication Sig Dispense Refill  ? acetaminophen (TYLENOL) 500 MG tablet Take 500 mg by mouth every 6 (six) hours as needed for mild pain.    ? albuterol (VENTOLIN HFA) 108 (90 Base) MCG/ACT inhaler Inhale 1-2 puffs into the lungs every 6 (six) hours as needed for wheezing or shortness of breath.     ? Alpha-D-Galactosidase (BEANO PO) Take 2 capsules by mouth with breakfast, with lunch, and with evening meal.    ? alum & mag hydroxide-simeth (MAALOX/MYLANTA) 200-200-20 MG/5ML suspension Take 15-30 mLs by mouth every 6 (six) hours as needed for indigestion or flatulence.    ? Calcium Carb-Cholecalciferol (CALCIUM+D3 PO) Take 1 tablet by mouth at bedtime.    ? cetirizine (ZYRTEC) 10 MG tablet TAKE 1 TABLET DAILY (Patient taking differently: Take 10 mg by mouth daily.) 90 tablet 4  ? LINZESS 145 MCG CAPS capsule Take 145 mcg by mouth daily.    ? metoprolol succinate (TOPROL-XL) 25 MG 24 hr tablet TAKE 1 TABLET TWICE A DAY (DISCONTINUE 50 MG) 180 tablet 3  ? nitroGLYCERIN (NITROSTAT) 0.4 MG SL tablet Place 1 tablet (0.4 mg total) under the tongue every 5 (five) minutes x 3 doses as needed for chest pain. 25 tablet 3  ? pantoprazole (PROTONIX) 40 MG tablet Take 40 mg by mouth daily before breakfast.    ? potassium chloride (KLOR-CON) 10 MEQ tablet TAKE AS INSTRUCTED BY YOUR PRESCRIBER (Patient taking differently: 20 mEq daily.) 180 tablet 3  ? Probiotic Product (ALIGN) 4 MG CAPS Take 4 mg by mouth in the morning.    ? Simethicone 125 MG TABS Take 125 mg by mouth in the morning, at noon, in the evening, and at bedtime.    ? SYNTHROID 50 MCG tablet TAKE ONE AND ONE-HALF TABLETS DAILY BEFORE BREAKFAST 135  tablet 3  ? XARELTO 20 MG TABS tablet TAKE 1 TABLET DAILY WITH SUPPER 90 tablet 3  ? ?No current facility-administered medications for this visit.  ? ? ?Allergies  ?Allergen Reactions  ? Contrast Media [Iodinated Contrast Media] Hives  ?  red  ? Atorvastatin Other (See Comments)  ?  Gas pain in abdomen with no relief  ? Marinol [Dronabinol]   ?  "Made me feel high and almost pass out"  ? Rosuvastatin Calcium Other (See Comments)  ?  Worsening GI upset  ? Simvastatin   ?  Myalgia & GI upset  ? Eluxadoline Nausea And Vomiting and Other (See Comments)  ?  Viberzi- And, made the patient feel "high"  ? Iohexol Hives and  Rash  ?  Red dye  ? ? ?Past Medical History:  ?Diagnosis Date  ? ACS (acute coronary syndrome) ([place]) 01/06/2020  ? With progressive angina while troponin elevation--borderline-NON-STEMI  ? ANTERIOR STEMI of LAD (x2). 01/19/1996  ? Complicated by VF arrest, and anoxic brain injury with status epilepticus; POBA of LAD --> 6 months later, after 2 vessel CABG she had postop complication with occluded LIMA/second anterior STEMI  ? CAD in native artery & Grafts 01/1996  ? a) 1/'98: Ant STEMI => LAD POBA; b) 2/'98: 95% prox LAD @ POBA site --> BMS PCI (3.0 mm x 25 mm); c) 7/98 (5 months later) => BMS ISR of LAD--> CABGx2 (LIMA-LAD, SVG-D1 -> Emergent redo w/ SVG-LAD b/c acute LIMA occlusion); d) 01/2020: ACS -> DES PCI of SVG-LAD (75%&85%), TO of SVG-D1.  ? COPD (chronic obstructive pulmonary disease) ([place])   ? GERD (gastroesophageal reflux disease)   ? Hiatal hernia   ? Hyperlipidemia   ? Hypothyroidism   ? On Synthroid  ? Iron deficiency anemia   ? Irritable bowel syndrome   ? Followed by Dr. Juanita Craver.  ? Ischemic cardiomyopathy 01/03/2020  ? ECHO (ACS): EF 45-50% (by Cath EF 40-45%) - Apical Inferior/Apical Anteroseptal & Apex akinetic, GR II DD.   ? PAF (paroxysmal atrial fibrillation) ([place]) 05/03/2014  ? New onset - originally with RVR  ? PONV (postoperative nausea and vomiting)   ? very gag reflex  ? S/P CABG x 2; with EMERGENT Redo x 1. 07/1996  ? Initially LIMA-LAD, SVG-D1 --> immediate LIMA failure post CABG with anterior MI --> urgent redo SVG-LAD beyond D2.; Widely patent grafts as of 2005 with left dominant system. Graft to the diagonal branch has a very small target vessel but the vein graft to LAD retrograde fills a large bifurcating D2 and antegrade fills a large D3.  ? Tobacco abuse   ? ? ?Blood pressure (!) 146/84, pulse (!) 59, resp. rate 14, height '5\' 2"'$  (1.575 m), weight 156 lb 6.4 oz (70.9 kg), SpO2 96 %. ? ? ?Hyperlipidemia with target LDL less than 70 ?Patient with ASCVD and LDL cholesterol above  goal of < 70.  Reviewed options for lowering LDL cholesterol, including ezetimibe, PCSK-9 inhibitors, bempedoic acid and inclisiran.  Discussed mechanisms of action, dosing, side effects and potential decreases in LDL cholesterol.  Also reviewed cost information and potential options for patient assistance.  Answered all patient questions.  Based on this information, patient would prefer to start [place].  We will start the process to get approval from her insurance company.  Once approved she will take 1 dose every 14 days and repeat cholesterol labs in 2-3 months.   ? ? ?Tommy Medal PharmD CPP [facility] ?[facility] ?[address] ?[place], [place] [address] ?[phone] ? ? ? ?

## 2021-05-19 NOTE — Assessment & Plan Note (Signed)
Patient with ASCVD and LDL cholesterol above goal of < 70.  Reviewed options for lowering LDL cholesterol, including ezetimibe, PCSK-9 inhibitors, bempedoic acid and inclisiran.  Discussed mechanisms of action, dosing, side effects and potential decreases in LDL cholesterol.  Also reviewed cost information and potential options for patient assistance.  Answered all patient questions.  Based on this information, patient would prefer to start [place].  We will start the process to get approval from her insurance company.  Once approved she will take 1 dose every 14 days and repeat cholesterol labs in 2-3 months.   ? ?

## 2021-05-19 NOTE — Patient Instructions (Signed)
Your Results: ?           ? Your most recent labs Goal  ?Total Cholesterol 204 < 200  ?Triglycerides 113 < 150  ?HDL (happy/good cholesterol) 56 > 40  ?LDL (lousy/bad cholesterol 128 < 70  ? ?Medication changes: ? We will start the process to get Repatha covered by your insurance.  Once the prior authorization is complete, Grandville Silos will call you to let you know and confirm pharmacy information.   You will take one injection every 14 days ? ?Lab orders: ? We want to repeat labs after 2-3 months.  We will send you a lab order to remind you once we get closer to that time.   ? ? ?Thank you for choosing [facility]  ? ?

## 2021-05-24 ENCOUNTER — Telehealth: Payer: Self-pay

## 2021-05-24 DIAGNOSIS — E785 Hyperlipidemia, unspecified: Secondary | ICD-10-CM

## 2021-05-24 DIAGNOSIS — I25710 Atherosclerosis of autologous vein coronary artery bypass graft(s) with unstable angina pectoris: Secondary | ICD-10-CM

## 2021-05-24 DIAGNOSIS — Z951 Presence of aortocoronary bypass graft: Secondary | ICD-10-CM

## 2021-05-24 NOTE — Telephone Encounter (Signed)
-----   Message from Rockne Menghini, [place] sent at 05/19/2021  4:16 PM EDT ----- Regarding: repatha Could you please do PA for Repatha 140 mg q14 days with labs in 2-3 months  Thank you! K

## 2021-05-24 NOTE — Telephone Encounter (Signed)
I believe the patient must try zetia first! Routing to dr. Brion Aliment When completing pa they asked: Has the patient tried any statin at a maximally tolerated dose in combination with ezetimibe (Zetia) for at least 4 to 6 weeks?  Yes  No Has the patient tried ezetimibe (Zetia) either as monotherapy (alone) or with other lipid-lowering therapy for at least 4 to 6 weeks? -- NOTE: Other lipid-lowering therapy such as fenofibrate, niacin, or a bile acid sequestrant.  Lipid and hepatic panel ordered/released  Pa submitted: Dominique Barber (KeyJeri Modena) - [id number] Repatha SureClick '140MG'$ /ML auto-injectors Status: PA Request Created: May 22nd, 2023 Sent: May 22nd, 2023

## 2021-05-26 NOTE — Telephone Encounter (Signed)
Appeal letter written and sent 05/26/21

## 2021-06-08 ENCOUNTER — Other Ambulatory Visit (INDEPENDENT_AMBULATORY_CARE_PROVIDER_SITE_OTHER): Payer: Medicare Other

## 2021-06-08 ENCOUNTER — Other Ambulatory Visit: Payer: Self-pay | Admitting: Endocrinology

## 2021-06-08 DIAGNOSIS — E890 Postprocedural hypothyroidism: Secondary | ICD-10-CM

## 2021-06-09 LAB — TSH: TSH: 1.66 u[IU]/mL (ref 0.35–5.50)

## 2021-06-09 LAB — T4, FREE: Free T4: 1.29 ng/dL (ref 0.60–1.60)

## 2021-06-10 ENCOUNTER — Encounter: Payer: Self-pay | Admitting: Endocrinology

## 2021-06-10 ENCOUNTER — Ambulatory Visit (INDEPENDENT_AMBULATORY_CARE_PROVIDER_SITE_OTHER): Payer: Medicare Other | Admitting: Endocrinology

## 2021-06-10 VITALS — BP 122/82 | HR 72 | Ht 62.0 in | Wt 157.4 lb

## 2021-06-10 DIAGNOSIS — I255 Ischemic cardiomyopathy: Secondary | ICD-10-CM

## 2021-06-10 DIAGNOSIS — E890 Postprocedural hypothyroidism: Secondary | ICD-10-CM | POA: Diagnosis not present

## 2021-06-10 NOTE — Progress Notes (Signed)
Patient ID: Dominique Barber, female   DOB: 1955-04-15, 66 y.o.   MRN: [id number]   Reason for Appointment: Thyroid follow-up    History of Present Illness:   Her hypothyroidism was first diagnosed in 1999 after radioactive iodine treatment for toxic nodular goiter Subsequently she became hypothyroid and has been on thyroid supplementation  She has been on the same dose about 11/2014 when her TSH was lower than usual at 0.13 Her dose was reduced from 100 mcg down to 75 g     Subsequently TSH has been normal consistently  No complaints of fatigue recently            The patient is taking the levothyroxine supplement daily in the morning on waking up on empty stomach She takes this very regularly Because of fear of allergy from dyes she is taking 50 mcg synthroid tablet and using 1-1/2 tablets daily  Not taking any calcium or iron containing vitamins at the same time in the morning   Lab Results  Component Value Date   TSH 1.66 06/08/2021   TSH 1.28 05/18/2020   TSH 1.139 01/02/2020   FREET4 1.29 06/08/2021   FREET4 1.28 05/18/2020   FREET4 1.18 05/14/2019   Wt Readings from Last 3 Encounters:  06/10/21 157 lb 6.4 oz (71.4 kg)  05/19/21 156 lb 6.4 oz (70.9 kg)  05/10/21 157 lb 9.6 oz (71.5 kg)     GOITER:   She has had a multinodular goiter since her 46s. Details of this are also not available at present. She recalls that she has had a benign biopsy in the past   No symptoms of difficulty swallowing or local pressure in the neck   Allergies as of 06/10/2021       Reactions   Contrast Media [iodinated Contrast Media] Hives   red   Atorvastatin Other (See Comments)   Gas pain in abdomen with no relief   Marinol [dronabinol]    "Made me feel high and almost pass out"   Rosuvastatin Calcium Other (See Comments)   Worsening GI upset   Simvastatin    Myalgia & GI upset   Eluxadoline Nausea And Vomiting, Other (See Comments)   Viberzi- And, made the  patient feel "high"   Iohexol Hives, Rash   Red dye        Medication List        Accurate as of June 10, 2021  4:10 PM. If you have any questions, ask your nurse or doctor.          acetaminophen 500 MG tablet Commonly known as: TYLENOL Take 500 mg by mouth every 6 (six) hours as needed for mild pain.   albuterol 108 (90 Base) MCG/ACT inhaler Commonly known as: VENTOLIN HFA Inhale 1-2 puffs into the lungs every 6 (six) hours as needed for wheezing or shortness of breath.   Align 4 MG Caps Take 4 mg by mouth in the morning.   alum & mag hydroxide-simeth 200-200-20 MG/5ML suspension Commonly known as: MAALOX/MYLANTA Take 15-30 mLs by mouth every 6 (six) hours as needed for indigestion or flatulence.   BEANO PO Take 2 capsules by mouth with breakfast, with lunch, and with evening meal.   CALCIUM+D3 PO Take 1 tablet by mouth at bedtime.   cetirizine 10 MG tablet Commonly known as: ZYRTEC TAKE 1 TABLET DAILY   Linzess 145 MCG Caps capsule Generic drug: linaclotide Take 145 mcg by mouth daily.   metoprolol succinate 25 MG  24 hr tablet Commonly known as: TOPROL-XL TAKE 1 TABLET TWICE A DAY (DISCONTINUE 50 MG)   nitroGLYCERIN 0.4 MG SL tablet Commonly known as: NITROSTAT Place 1 tablet (0.4 mg total) under the tongue every 5 (five) minutes x 3 doses as needed for chest pain.   pantoprazole 40 MG tablet Commonly known as: PROTONIX Take 40 mg by mouth daily before breakfast.   potassium chloride 10 MEQ tablet Commonly known as: KLOR-CON TAKE AS INSTRUCTED BY YOUR PRESCRIBER What changed: See the new instructions.   Simethicone 125 MG Tabs Take 125 mg by mouth in the morning, at noon, in the evening, and at bedtime.   Symax-SR 0.375 MG 12 hr tablet Generic drug: hyoscyamine Take by mouth.   Synthroid 50 MCG tablet Generic drug: levothyroxine TAKE ONE AND ONE-HALF TABLETS DAILY BEFORE BREAKFAST   Xarelto 20 MG Tabs tablet Generic drug: rivaroxaban TAKE  1 TABLET DAILY WITH SUPPER        Allergies:  Allergies  Allergen Reactions   Contrast Media [Iodinated Contrast Media] Hives    red   Atorvastatin Other (See Comments)    Gas pain in abdomen with no relief   Marinol [Dronabinol]     "Made me feel high and almost pass out"   Rosuvastatin Calcium Other (See Comments)    Worsening GI upset   Simvastatin     Myalgia & GI upset   Eluxadoline Nausea And Vomiting and Other (See Comments)    Viberzi- And, made the patient feel "high"   Iohexol Hives and Rash    Red dye    Past Medical History:  Diagnosis Date   ACS (acute coronary syndrome) ([place]) 01/06/2020   With progressive angina while troponin elevation--borderline-NON-STEMI   ANTERIOR STEMI of LAD (x2). 81/82/9937   Complicated by VF arrest, and anoxic brain injury with status epilepticus; POBA of LAD --> 6 months later, after 2 vessel CABG she had postop complication with occluded LIMA/second anterior STEMI   CAD in native artery & Grafts 01/1996   a) 1/'98: Ant STEMI => LAD POBA; b) 2/'98: 95% prox LAD @ POBA site --> BMS PCI (3.0 mm x 25 mm); c) 7/98 (5 months later) => BMS ISR of LAD--> CABGx2 (LIMA-LAD, SVG-D1 -> Emergent redo w/ SVG-LAD b/c acute LIMA occlusion); d) 01/2020: ACS -> DES PCI of SVG-LAD (75%&85%), TO of SVG-D1.   COPD (chronic obstructive pulmonary disease) ([facility])    GERD (gastroesophageal reflux disease)    Hiatal hernia    Hyperlipidemia    Hypothyroidism    On Synthroid   Iron deficiency anemia    Irritable bowel syndrome    Followed by Dr. Juanita Craver.   Ischemic cardiomyopathy 01/03/2020   ECHO (ACS): EF 45-50% (by Cath EF 40-45%) - Apical Inferior/Apical Anteroseptal & Apex akinetic, GR II DD.    PAF (paroxysmal atrial fibrillation) ([place]) 05/03/2014   New onset - originally with RVR   PONV (postoperative nausea and vomiting)    very gag reflex   S/P CABG x 2; with EMERGENT Redo x 1. 07/1996   Initially LIMA-LAD, SVG-D1 --> immediate LIMA failure  post CABG with anterior MI --> urgent redo SVG-LAD beyond D2.; Widely patent grafts as of 2005 with left dominant system. Graft to the diagonal branch has a very small target vessel but the vein graft to LAD retrograde fills a large bifurcating D2 and antegrade fills a large D3.   Tobacco abuse     Past Surgical History:  Procedure Laterality Date  APPENDECTOMY     BIOPSY  05/08/2020   Procedure: BIOPSY;  Surgeon: Carol Ada, MD;  Location: [facility];  Service: Endoscopy;;   BREAST BIOPSY Right    CESAREAN SECTION     CHOLECYSTECTOMY     CORONARY ANGIOPLASTY  01/19/1996   Acute anterior STEMI- LAD POBA:  p-m LAD 70-80% narrowing & focal 95% stenosis in distal 3rd --> Treated with Emergent PTCA( POBA)  (Dr. Marella Chimes   CORONARY ANGIOPLASTY WITH STENT PLACEMENT  02/26/1996   3.0x62m Multi-Link stent to prox/mid LAD (stenosis at recent PTCA site) (Dr. RMarella Chimes   [place]  07/31/1996   x2; LIMA to LAD and SVG to 1st diagonal (Dr. CRemus Loffler   CORONARY ARTERY BYPASS GRAFT  07/31/1996   x1; SVG to distal LAD (Dr. CRemus Loffler   CORONARY STENT INTERVENTION N/A 01/08/2020   Procedure: CORONARY STENT INTERVENTION;  Surgeon: HLeonie Man MD;  [facility];; , SVG-LAD severe focal disease with 75% followed by 85% calcified stenosis--DES PCI (Resolute Onyx 3.5 mm x 38 mm - 3.6 mm)   ESOPHAGOGASTRODUODENOSCOPY (EGD) WITH PROPOFOL N/A 05/08/2020   Procedure: ESOPHAGOGASTRODUODENOSCOPY (EGD) WITH PROPOFOL;  Surgeon: HCarol Ada MD;  Location: [facility];  Service: Endoscopy;  Laterality: N/A;   LEFT HEART CATH AND CORONARY ANGIOGRAPHY  07/30/1996   normal L main, LAD w/95% stenosis just before stent, diffuse 80-85% end-stent restenosis, 1st diagonal with70-75% ostial stenosis, large optional diagonal that was normal, Cfx with 2 marginals and distal PLA (all normal), RCA normal (Dr. RMarella Chimes   LEFT HEART CATH AND CORONARY ANGIOGRAPHY  01/19/1996   acute  anterior wall MI, anoxic encephalopahty, VF; LCfx free of disease and dominant, RCA patent, L main short and patent, LAD with 70-80% narrowing and focal 95% stenosis in distal 3rd => PTCA  (Dr. RMarella Chimes   LEFT HEART CATH AND CORONARY ANGIOGRAPHY  01/18/2017   acute anterior wall MI, anoxic encephalopahty, VF; LCfx free of disease and dominant, RCA patent, L main short and patent, LAD with 70-80% narrowing and focal 95% stenosis in distal 3rd  (Dr. RMarella Chimes   LEFT HEART CATH AND CORS/GRAFTS ANGIOGRAPHY N/A 01/08/2020   Procedure: LEFT HEART CATH AND CORS/GRAFTS ANGIOGRAPHY;  Surgeon: HLeonie Man MD;  [facility]; EF 40 to 45%. Mildly elevated LVEDP. Proximal LAD 100% occlusion, mid-distal LAD fills via SVG that has collaterals to D1. Normal dominant native LCx and nondominant RCA. SVG-D1 ostial 100% CTO, SVG-LAD severe focal disease with 75% followed by 85% calcified stenosis--DES PCI   LEFT HEART CATH AND CORS/GRAFTS ANGIOGRAPHY  01/31/2003   LAD totally occluded in prox 3rd, patent Cfx, SVG to mid LAd patent, SVG to small DX1 patent, LIMA to LAD was atretic and essentially occluded in junction of prox & mid 3rd, R barciocephalic and subclavian were normal (Dr. RMarella Chimes   [place] 10/2010; 06/2014   a) LexiScan Cardiolite - mild perfusion defect r/t infarct/scar with mild periifarct ischemia in mid anterior & apical anterior region; EF 67%; abnormal, low risk study;; b) Small, moderate intensity Fixed perfusion defect - mid Anterior Wall c/w known Anterior MI. LOW RISK    OVARIAN CYST REMOVAL     TRANSTHORACIC ECHOCARDIOGRAM  02/2012; 06/2014   a) EF 50-55%, mild HK of anterospetal myocardium; mild MR;; b) EF 55-60%, mild HK of apical anterior wall.   Mild MR   TRANSTHORACIC ECHOCARDIOGRAM  01/02/2020   EF 45  to 50%. Apical inferior, apical septal and apex akinetic. GRII DD. Otherwise normal valves, normal RV. Normal atria. Normal pressures.     Family History  Problem Relation Age of Onset   Heart failure Other    Diabetes Other    Diabetes Mother    Breast cancer Mother    Thyroid disease Paternal Aunt    Thyroid disease Maternal Grandmother     Social History:  reports that she quit smoking about 17 months ago. Her smoking use included cigarettes. She smoked an average of .25 packs per day. She has quit using smokeless tobacco. She reports that she does not drink alcohol and does not use drugs.  REVIEW Of SYSTEMS:  History of hyperlipidemia: Followed by cardiologist and taking Crestor   Lab Results  Component Value Date   CHOL 204 (H) 05/04/2021   HDL 56 05/04/2021   LDLCALC 128 (H) 05/04/2021   TRIG 113 05/04/2021   CHOLHDL 3.6 05/04/2021   She continues to be on Xarelto for PAF    Examination:   BP 122/82   Pulse 72   Ht '5\' 2"'$  (1.575 m)   Wt 157 lb 6.4 oz (71.4 kg)   SpO2 95%   BMI 28.79 kg/m   Thyroid: She has a 2 cm firm nodule felt in the midline on swallowing Lateral lobes are just palpable on swallowing, firm and with 1 cm nodules No lymphadenopathy in the neck    Assessment    MULTINODULAR goiter: Long-standing  She still has a small persistent nodular goiter and this is mostly felt in the midline as before This is likely a remnant of the previous multinodular goiter which was treated with I-131 No further evaluation needed  Hypothyroidism, post ablative secondary to previous I-131 treatment several years ago She has been on a consistent dose of 75 mcg SYNTHROID brand name since about 2016 She feels fairly well  TSH normal again     Treatment:  Continue 75 g Synthroid using the 50 mcg prescription and 1-1/2 tablets daily Follow-up in 1 year again      Elayne Snare 06/10/2021, 4:10 PM

## 2021-06-17 NOTE — Patient Outreach (Signed)
Received a referral notification for Ms. Finkle ---. I have assigned Raina Mina, RN to call for follow up and determine if there are any Case Management needs.    [facility], [place], [place] [facility] [phone]

## 2021-06-22 ENCOUNTER — Encounter: Payer: Self-pay | Admitting: *Deleted

## 2021-06-23 NOTE — Telephone Encounter (Signed)
This encounter was created in error - please disregard.

## 2021-06-24 DIAGNOSIS — E739 Lactose intolerance, unspecified: Secondary | ICD-10-CM | POA: Diagnosis not present

## 2021-06-24 DIAGNOSIS — K5730 Diverticulosis of large intestine without perforation or abscess without bleeding: Secondary | ICD-10-CM | POA: Diagnosis not present

## 2021-06-24 DIAGNOSIS — R140 Abdominal distension (gaseous): Secondary | ICD-10-CM | POA: Diagnosis not present

## 2021-06-24 DIAGNOSIS — K5904 Chronic idiopathic constipation: Secondary | ICD-10-CM | POA: Diagnosis not present

## 2021-07-06 ENCOUNTER — Other Ambulatory Visit: Payer: Self-pay | Admitting: Endocrinology

## 2021-07-11 ENCOUNTER — Encounter: Payer: Self-pay | Admitting: Pharmacist Clinician (PhC)/ Clinical Pharmacy Specialist

## 2021-07-20 ENCOUNTER — Other Ambulatory Visit: Payer: Self-pay | Admitting: Pharmacist Clinician (PhC)/ Clinical Pharmacy Specialist

## 2021-07-20 MED ORDER — EZETIMIBE 10 MG PO TABS: 10.0000 mg | ORAL_TABLET | Freq: Every day | ORAL | 3 refills | Status: DC

## 2021-08-17 ENCOUNTER — Other Ambulatory Visit: Payer: Self-pay | Admitting: Pharmacist Clinician (PhC)/ Clinical Pharmacy Specialist

## 2021-08-17 MED ORDER — EZETIMIBE 10 MG PO TABS: 10.0000 mg | ORAL_TABLET | Freq: Every day | ORAL | 3 refills | Status: DC

## 2021-08-31 DIAGNOSIS — K5904 Chronic idiopathic constipation: Secondary | ICD-10-CM | POA: Diagnosis not present

## 2021-08-31 DIAGNOSIS — K219 Gastro-esophageal reflux disease without esophagitis: Secondary | ICD-10-CM | POA: Diagnosis not present

## 2021-08-31 DIAGNOSIS — K5730 Diverticulosis of large intestine without perforation or abscess without bleeding: Secondary | ICD-10-CM | POA: Diagnosis not present

## 2021-08-31 DIAGNOSIS — Z1211 Encounter for screening for malignant neoplasm of colon: Secondary | ICD-10-CM | POA: Diagnosis not present

## 2021-08-31 DIAGNOSIS — E739 Lactose intolerance, unspecified: Secondary | ICD-10-CM | POA: Diagnosis not present

## 2021-11-15 ENCOUNTER — Encounter: Payer: Self-pay | Admitting: Cardiology

## 2021-11-15 ENCOUNTER — Ambulatory Visit: Payer: Medicare Other | Attending: Cardiology | Admitting: Cardiology

## 2021-11-15 VITALS — BP 130/70 | HR 62 | Ht 62.0 in | Wt 155.6 lb

## 2021-11-15 DIAGNOSIS — D6869 Other thrombophilia: Secondary | ICD-10-CM | POA: Diagnosis not present

## 2021-11-15 DIAGNOSIS — I10 Essential (primary) hypertension: Secondary | ICD-10-CM | POA: Diagnosis not present

## 2021-11-15 DIAGNOSIS — Z720 Tobacco use: Secondary | ICD-10-CM | POA: Diagnosis not present

## 2021-11-15 DIAGNOSIS — I25710 Atherosclerosis of autologous vein coronary artery bypass graft(s) with unstable angina pectoris: Secondary | ICD-10-CM | POA: Insufficient documentation

## 2021-11-15 DIAGNOSIS — I25119 Atherosclerotic heart disease of native coronary artery with unspecified angina pectoris: Secondary | ICD-10-CM | POA: Insufficient documentation

## 2021-11-15 DIAGNOSIS — E785 Hyperlipidemia, unspecified: Secondary | ICD-10-CM | POA: Diagnosis not present

## 2021-11-15 DIAGNOSIS — Z951 Presence of aortocoronary bypass graft: Secondary | ICD-10-CM

## 2021-11-15 DIAGNOSIS — I480 Paroxysmal atrial fibrillation: Secondary | ICD-10-CM | POA: Diagnosis not present

## 2021-11-15 DIAGNOSIS — I255 Ischemic cardiomyopathy: Secondary | ICD-10-CM | POA: Diagnosis not present

## 2021-11-15 MED ORDER — NITROGLYCERIN 0.4 MG SL SUBL: 0.4000 mg | SUBLINGUAL_TABLET | SUBLINGUAL | 6 refills | Status: DC | PRN

## 2021-11-15 NOTE — Patient Instructions (Addendum)
Medication Instructions:   No changes *If you need a refill on your cardiac medications before your next appointment, please call your pharmacy*   Lab Work: Lipid CMP    Testing/Procedures:  Not needed  Follow-Up: At [facility], you and your health needs are our priority.  As part of our continuing mission to provide you with exceptional heart care, we have created designated Provider Care Teams.  These Care Teams include your primary Cardiologist (physician) and Advanced Practice Providers (APPs -  Physician Assistants and Nurse Practitioners) who all work together to provide you with the care you need, when you need it.     Your next appointment:   6 month(s)  The format for your next appointment:   In Person  Provider:   Caron Presume, PA-C or Diona Browner, NP    Then, Glenetta Hew, MD will plan to see you again in 12 month(s).   Other Instructions    If you are in [place] longer than 6 hours or more go to the [facility] ER  for possible  cardioversion.

## 2021-11-15 NOTE — Progress Notes (Signed)
Primary Care Provider: Dorna Mai, [place] Cardiologist: Glenetta Hew, MD Electrophysiologist: None  Clinic Note: Chief Complaint  Patient presents with   Follow-up    6 months.  Doing well well from a car standpoint.   Coronary Artery Disease    No angina   ===================================  ASSESSMENT/PLAN   Problem List Items Addressed This Visit       Cardiology Problems   Native CAD- 100% LAD, s/p SVG-LAD after failed LIMA-LAD - Primary (Chronic)    Pretty complicated early course of CAD but did really well until the end of 2021 she started having a lot of anginal symptoms that were complicated and confusing because of her GI issues.  She underwent PCI of the SVG-LAD in January 2022 and has felt much better since then. I think she has had 2 STEMI's and a non-STEMI and her history but had done very well up until her most recent.  No longer on antiplatelet agent, on Xarelto alone.  Plan: On low-dose Toprol 25 mg twice daily to avoid hypotension. No longer on ARB because of hypotension in the past. She is on Zetia monotherapy for now.  Considering possibility of PCSK9 inhibitor Repatha. No antiplatelet agent-on Xarelto.      Relevant Medications   nitroGLYCERIN (NITROSTAT) 0.4 MG SL tablet   Coronary artery disease involving autologous vein coronary bypass graft with unstable angina pectoris ([place]) (Chronic)    Recent (January 2023) PCI to the SVG-LAD with resolution of of her somewhat atypical anginal symptoms.  Doing well since then.  She does have an occluded vein graft to a diagonal branch that is actually getting collaterals from the second diagonal branch.  Plan: Reassess Myoview in 2 to 3 years depending on symptoms.      Relevant Medications   nitroGLYCERIN (NITROSTAT) 0.4 MG SL tablet   Other Relevant Orders   EKG 12-Lead (Completed)   Lipid panel (Completed)   Comprehensive metabolic panel (Completed)   Paroxysmal atrial  fibrillation ([place]); CHA2DS2-VASc Score 3. On Xarelto (Chronic)    Thankfully, she has not had any real breakthrough spells of A-fib.  She remains on twice daily Toprol.  Stable on Xarelto.  If she does go in A-fib and has a prolonged spell, recommendation would be to go to the ER for cardioversion.  At that time we may need to consider antiarrhythmics.  Otherwise if stable then simply follow-up as scheduled.      Relevant Medications   nitroGLYCERIN (NITROSTAT) 0.4 MG SL tablet   Other Relevant Orders   EKG 12-Lead (Completed)   Hyperlipidemia with target LDL less than 70 (Chronic)    Based on her visit with Tommy Medal, RPH- CCP in [facility], the plan was to start the process for Repatha.  I do not see it listed on her medication list.  She is taking Zetia alone. We will check labs to reassess in order to help facilitate getting Repatha approved. There was some "hiccups "in the process.  Hopefully before the end of the year this will be resolved.  Plan: Check lipids and continue Zetia for now.   Start Repatha when able.      Relevant Medications   nitroGLYCERIN (NITROSTAT) 0.4 MG SL tablet   Other Relevant Orders   Lipid panel (Completed)   Comprehensive metabolic panel (Completed)   Hypercoagulable state due to paroxysmal atrial fibrillation (HCC) (Chronic)    CHA2DS2-VASc score at minimum 3.  Stable on Xarelto.  Okay to hold Xarelto without bridging-1  to 2 days preprocedure.  For high risk procedures such as organ biopsies, and spinal procedures, can hold for 3 days if necessary.      Relevant Medications   nitroGLYCERIN (NITROSTAT) 0.4 MG SL tablet   Essential hypertension (Chronic)   Relevant Medications   nitroGLYCERIN (NITROSTAT) 0.4 MG SL tablet     Other   Tobacco abuse (Chronic)    With all of her GI issues, she is using this as a crutch.  Not quite willing to quit.      S/P CABG x 2 (Chronic)    Now with only SVG-LAD staining.  The SVG to diagonal branch is  occluded and being filled via collaterals.  Her original LIMA to immediately post CABG.  She now has an SVG to the LAD with stents placed.  Plan will reassess stress test in 2 to 3 years.       ===================================  HPI:    Dominique Barber is a 66 y.o. female with a PMH below who presents today for 45-monthfollow-up. She returns today at the request of WDorna Mai MD.  Cardiac History: CAD: STEMI 1998 w/ VR Arrest & ~ anoxic brain injury -> status epilepticus -> POBA-LAD --> 1 month later returned with [employer]--> 95% re-stenosis --> BMS-LAD (Dr. WRollene Fare 7/'98 Recurrent Angina -> severe ISR --> CABG x 2 (LIMA-LAD, SVG-D1) => emergent re-operation post-OP --> LIMA occlusion --> SVG-LAD. Jan 2005: Cath -> patent grafts & normal Dom LCx, non-dome RCA. Small D1). Jan 02, 2020: Admitted with Afib-RVR & Demand Ischemia (+ Trop & lat TWI) - planned OP Myoview Jan 04 2020 - ER with CP (left after 12 hr & not seen) --> EMS came out on Jan 2, EKG no longer had TWI.  Jan 06, 2020 -> returned to ER with recurrent ACS Sx --> Cath Jan 08, 2020 Cath-PCI SVG-LAD, SVG-Diag CTO. Stable native Dom LCx & non-dome RCA PAF -Most recent episode Dec 2021 -> converted with IV Diltiazem CRFs: HTN, HLD;  COPD, GERD,   ** IBS has been flaring up since Dec 2021 (mostly notes epigastric pain, bloating, early satiety & nausea  Dominique CHEADLEwas last seen on 05/10/2021 accompanied by her husband who has become a major component of these visits as he has not dominant speaker.  She still had abdominal pain issues but was much better with Linzess.  Still having bloating but improved.  Eating smaller and more frequent meals.  This is a reduce the pain level.  No active cardiac issues.  She stopped taking rosuvastatin thinking was making her GI issues worse along with myalgias. Referred to lipid clinic to consider PCSK9 inhibitor. Tolerating splitting the dose of Toprol to twice daily 25 mg as opposed  to once daily.  No bleeding on Xarelto Plan was to consider relook Myoview in 2 to 3 years.  Recent Hospitalizations: None  Seen by KCyril Mourningon 05/19/2021-[facility].  Reviewed options of medications.  Suggested starting on Repatha.   Reviewed  CV studies:    The following studies were reviewed today: (if available, images/films reviewed: From Epic Chart or Care Everywhere) No new studies:  Interval History:   HVERMELL MADRIDreturns here today again with her husband.  But she is in better spirits today she is actually feeling well.  She had 1 spell of what she feels irregular heart rate spells may be A-fib that lasted about 5 to 10 minutes but otherwise has been doing pretty well.  It was not  a fast heart rate spells just felt irregular.  This was triggered by some GI upset.  But actually she is pretty happy with how her GI symptoms have been doing because she has been having improved bloating stating that Linzess is is helping a lot.  Not completely clearing it but helping a lot.  She still has like some left-sided flank pain and upper abdominal pain.  Other than that 1 episode of palpitations, she has been pretty stable quite stable with no chest pain or pressure retroversion.  She is trying to stay more active and try to get more exercise and.   CV Review of Symptoms (Summary): Cardiovascular ROS: no chest pain or dyspnea on exertion positive for - irregular heartbeat and palpitations negative for - edema, orthopnea, paroxysmal nocturnal dyspnea, rapid heart rate, shortness of breath, or syncope/near syncope or TIA/amaurosis fugax or claudication  REVIEWED OF SYSTEMS   Review of Systems  Constitutional:  Negative for malaise/fatigue and weight loss.  HENT:  Negative for congestion and nosebleeds.   Respiratory:  Negative for cough and shortness of breath.   Gastrointestinal:  Positive for constipation, diarrhea and heartburn. Negative for blood in stool and melena.       She still has a  significant gas and bloating issues but it is improving.  Genitourinary:  Negative for hematuria.  Musculoskeletal:  Positive for back pain and joint pain. Negative for falls and myalgias.  Neurological:  Negative for dizziness.  Psychiatric/Behavioral:  Negative for depression and memory loss. The patient is nervous/anxious. The patient does not have insomnia.     I have reviewed and (if needed) personally updated the patient's problem list, medications, allergies, past medical and surgical history, social and family history.   PAST MEDICAL HISTORY   Past Medical History:  Diagnosis Date   ACS (acute coronary syndrome) ([place]) 01/06/2020   With progressive angina while troponin elevation--borderline-NON-STEMI   ANTERIOR STEMI of LAD (x2). 60/63/0160   Complicated by VF arrest, and anoxic brain injury with status epilepticus; POBA of LAD --> 6 months later, after 2 vessel CABG she had postop complication with occluded LIMA/second anterior STEMI   CAD in native artery & Grafts 01/1996   a) 1/'98: Ant STEMI => LAD POBA; b) 2/'98: 95% prox LAD @ POBA site --> BMS PCI (3.0 mm x 25 mm); c) 7/98 (5 months later) => BMS ISR of LAD--> CABGx2 (LIMA-LAD, SVG-D1 -> Emergent redo w/ SVG-LAD b/c acute LIMA occlusion); d) 01/2020: ACS -> DES PCI of SVG-LAD (75%&85%), TO of SVG-D1.   COPD (chronic obstructive pulmonary disease) (HCC)    GERD (gastroesophageal reflux disease)    Hiatal hernia    Hyperlipidemia    Hypothyroidism    On Synthroid   Iron deficiency anemia    Irritable bowel syndrome    Followed by Dr. Juanita Craver.   Ischemic cardiomyopathy 01/03/2020   ECHO (ACS): EF 45-50% (by Cath EF 40-45%) - Apical Inferior/Apical Anteroseptal & Apex akinetic, GR II DD.    PAF (paroxysmal atrial fibrillation) ([place]) 05/03/2014   New onset - originally with RVR   PONV (postoperative nausea and vomiting)    very gag reflex   S/P CABG x 2; with EMERGENT Redo x 1. 07/1996   Initially LIMA-LAD, SVG-D1 -->  immediate LIMA failure post CABG with anterior MI --> urgent redo SVG-LAD beyond D2.; Widely patent grafts as of 2005 with left dominant system. Graft to the diagonal branch has a very small target vessel but the vein graft  to LAD retrograde fills a large bifurcating D2 and antegrade fills a large D3.   Tobacco abuse     PAST SURGICAL HISTORY   Past Surgical History:  Procedure Laterality Date   APPENDECTOMY     BIOPSY  05/08/2020   Procedure: BIOPSY;  Surgeon: Carol Ada, MD;  Location: [facility];  Service: Endoscopy;;   BREAST BIOPSY Right    CESAREAN SECTION     CHOLECYSTECTOMY     CORONARY ANGIOPLASTY  01/19/1996   Acute anterior STEMI- LAD POBA:  p-m LAD 70-80% narrowing & focal 95% stenosis in distal 3rd --> Treated with Emergent PTCA( POBA)  (Dr. Marella Chimes   CORONARY ANGIOPLASTY WITH STENT PLACEMENT  02/26/1996   3.0x44m Multi-Link stent to prox/mid LAD (stenosis at recent PTCA site) (Dr. RMarella Chimes   CDeweeseGRAFT  07/31/1996   x2; LIMA to LAD and SVG to 1st diagonal (Dr. CRemus Loffler   CORONARY ARTERY BYPASS GRAFT  07/31/1996   x1; SVG to distal LAD (Dr. CRemus Loffler   CORONARY STENT INTERVENTION N/A 01/08/2020   Procedure: CORONARY STENT INTERVENTION;  Surgeon: HLeonie Man MD;  [facility];; , SVG-LAD severe focal disease with 75% followed by 85% calcified stenosis--DES PCI (Resolute Onyx 3.5 mm x 38 mm - 3.6 mm)   ESOPHAGOGASTRODUODENOSCOPY (EGD) WITH PROPOFOL N/A 05/08/2020   Procedure: ESOPHAGOGASTRODUODENOSCOPY (EGD) WITH PROPOFOL;  Surgeon: HCarol Ada MD;  Location: [facility];  Service: Endoscopy;  Laterality: N/A;   LEFT HEART CATH AND CORONARY ANGIOGRAPHY  07/30/1996   normal L main, LAD w/95% stenosis just before stent, diffuse 80-85% end-stent restenosis, 1st diagonal with70-75% ostial stenosis, large optional diagonal that was normal, Cfx with 2 marginals and distal PLA (all normal), RCA normal (Dr. RMarella Chimes   LEFT HEART  CATH AND CORONARY ANGIOGRAPHY  01/19/1996   acute anterior wall MI, anoxic encephalopahty, VF; LCfx free of disease and dominant, RCA patent, L main short and patent, LAD with 70-80% narrowing and focal 95% stenosis in distal 3rd => PTCA  (Dr. RMarella Chimes   LEFT HEART CATH AND CORONARY ANGIOGRAPHY  01/18/2017   acute anterior wall MI, anoxic encephalopahty, VF; LCfx free of disease and dominant, RCA patent, L main short and patent, LAD with 70-80% narrowing and focal 95% stenosis in distal 3rd  (Dr. RMarella Chimes   LEFT HEART CATH AND CORS/GRAFTS ANGIOGRAPHY N/A 01/08/2020   Procedure: LEFT HEART CATH AND CORS/GRAFTS ANGIOGRAPHY;  Surgeon: HLeonie Man MD;  [facility]; EF 40 to 45%. Mildly elevated LVEDP. Proximal LAD 100% occlusion, mid-distal LAD fills via SVG that has collaterals to D1. Normal dominant native LCx and nondominant RCA. SVG-D1 ostial 100% CTO, SVG-LAD severe focal disease with 75% followed by 85% calcified stenosis--DES PCI   LEFT HEART CATH AND CORS/GRAFTS ANGIOGRAPHY  01/31/2003   LAD totally occluded in prox 3rd, patent Cfx, SVG to mid LAd patent, SVG to small DX1 patent, LIMA to LAD was atretic and essentially occluded in junction of prox & mid 3rd, R barciocephalic and subclavian were normal (Dr. RMarella Chimes   [place] 10/2010; 06/2014   a) LexiScan Cardiolite - mild perfusion defect r/t infarct/scar with mild periifarct ischemia in mid anterior & apical anterior region; EF 67%; abnormal, low risk study;; b) Small, moderate intensity Fixed perfusion defect - mid Anterior Wall c/w known Anterior MI. LOW RISK    OVARIAN CYST REMOVAL     TRANSTHORACIC ECHOCARDIOGRAM  02/2012;  06/2014   a) EF 50-55%, mild HK of anterospetal myocardium; mild MR;; b) EF 55-60%, mild HK of apical anterior wall.   Mild MR   TRANSTHORACIC ECHOCARDIOGRAM  01/02/2020   EF 45 to 50%. Apical inferior, apical septal and apex akinetic. GRII DD. Otherwise normal valves,  normal RV. Normal atria. Normal pressures.    LHC-CORS-GRAFTS-PCI (01/08/2020): EF 40 to 45%. Mildly elevated LVEDP. Proximal LAD 100% occlusion, mid-distal LAD fills via SVG that has collaterals to D1. Normal dominant native LCx and nondominant RCA. SVG-D1 ostial 100% CTO, SVG-LAD severe focal disease with 75% followed by 85% calcified stenosis--DES PCI (Resolute Onyx 3.5 mm x 38 mm - 3.6 mm) Dx                                                                                                                              PCI    Immunization History  Administered Date(s) Administered   Influenza,inj,Quad PF,6+ Mos 12/04/2013, 12/05/2014, 10/10/2017   Influenza-Unspecified 01/03/2010   Tdap 01/03/2013    MEDICATIONS/ALLERGIES   Current Meds  Medication Sig   acetaminophen (TYLENOL) 500 MG tablet Take 500 mg by mouth every 6 (six) hours as needed for mild pain.   albuterol (VENTOLIN HFA) 108 (90 Base) MCG/ACT inhaler Inhale 1-2 puffs into the lungs every 6 (six) hours as needed for wheezing or shortness of breath.   Alpha-D-Galactosidase (BEANO PO) Take 2 capsules by mouth with breakfast, with lunch, and with evening meal.   alum & mag hydroxide-simeth (MAALOX/MYLANTA) 200-200-20 MG/5ML suspension Take 15-30 mLs by mouth every 6 (six) hours as needed for indigestion or flatulence.   Calcium Carb-Cholecalciferol (CALCIUM+D3 PO) Take 1 tablet by mouth at bedtime.   cetirizine (ZYRTEC) 10 MG tablet TAKE 1 TABLET DAILY (Patient taking differently: Take 10 mg by mouth daily.)   ezetimibe (ZETIA) 10 MG tablet Take 1 tablet (10 mg total) by mouth daily.   LINZESS 145 MCG CAPS capsule Take 145 mcg by mouth daily.   metoprolol succinate (TOPROL-XL) 25 MG 24 hr tablet TAKE 1 TABLET TWICE A DAY (DISCONTINUE 50 MG)   nitroGLYCERIN (NITROSTAT) 0.4 MG SL tablet Place 1 tablet (0.4 mg total) under the tongue every 5 (five) minutes x 3 doses as needed for chest pain.   pantoprazole (PROTONIX) 40 MG tablet Take  40 mg by mouth daily before breakfast.   potassium chloride (KLOR-CON) 10 MEQ tablet TAKE AS INSTRUCTED BY YOUR PRESCRIBER (Patient taking differently: 20 mEq daily.)   Probiotic Product (ALIGN) 4 MG CAPS Take 4 mg by mouth in the morning.   Simethicone 125 MG TABS Take 125 mg by mouth in the morning, at noon, in the evening, and at bedtime.   SYNTHROID 50 MCG tablet TAKE ONE AND ONE-HALF TABLETS DAILY BEFORE BREAKFAST   XARELTO 20 MG TABS tablet TAKE 1 TABLET DAILY WITH SUPPER     Allergies  Allergen Reactions   Contrast Media [Iodinated Contrast Media] Hives  red   Atorvastatin Other (See Comments)    Gas pain in abdomen with no relief   Marinol [Dronabinol]     "Made me feel high and almost pass out"   Rosuvastatin Calcium Other (See Comments)    Worsening GI upset   Simvastatin     Myalgia & GI upset   Eluxadoline Nausea And Vomiting and Other (See Comments)    Viberzi- And, made the patient feel "high"   Iohexol Hives and Rash    Red dye    SOCIAL HISTORY/FAMILY HISTORY   Reviewed in Epic:  Pertinent findings:  Social History   Tobacco Use   Smoking status: Former    Packs/day: 0.25    Types: Cigarettes    Quit date: 01/04/2020    Years since quitting: 1.9   Smokeless tobacco: Former  Substance Use Topics   Alcohol use: No    Alcohol/week: 0.0 standard drinks of alcohol   Drug use: Never   Social History   Social History Narrative   Married, mother of 3 children (2 living sons age 57-26 and 30-31; her daughter was the victim of a murder that took place sometime around the time of the patient's MI. She was under significant amount of stress as her daughter had gone missing. She was never found. Finally the suspect was charged and convicted in 2009. She had sessile he stop smoking until that time frame when it brought back memories and she is now back to smoking a half pack a day.She does not get routine exercise, but is active around the house and doing  housecleaning chores.She does not drink alcohol.         Family history: mgm died at 72, several maternal aunts and uncles all died in their 30's as well; mother didn't have any heart issues, had dementia, DM, died in her 13's; father lung cancer - smoker died at 1; 1 brother w/o heart issues; 2 living children - one with seizure disorder, no cholesterol issues known       Diet: doesn't eat much; has problems with GI/bloating since bypass; maybe 1-1.5 meals per day; oatmeal in the monings; sandwiches/soups in small amounts in the day; IBS issues more constipation; linzess, simethicone, maalox       Exercise:  housework    OBJCTIVE -PE, EKG, labs   Wt Readings from Last 3 Encounters:  11/15/21 155 lb 9.6 oz (70.6 kg)  06/10/21 157 lb 6.4 oz (71.4 kg)  05/19/21 156 lb 6.4 oz (70.9 kg)    Physical Exam: BP 130/70 (BP Location: Left Arm, Patient Position: Sitting)   Pulse 62   Ht '5\' 2"'$  (1.575 m)   Wt 155 lb 9.6 oz (70.6 kg)   SpO2 97%   BMI 28.46 kg/m  Physical Exam Vitals reviewed.  Constitutional:      General: She is not in acute distress.    Appearance: Normal appearance. She is normal weight. She is not ill-appearing or toxic-appearing.  HENT:     Head: Normocephalic and atraumatic.  Cardiovascular:     Rate and Rhythm: Normal rate and regular rhythm.     Pulses: Normal pulses.     Heart sounds: Normal heart sounds. No murmur heard.    No friction rub. No gallop.  Pulmonary:     Effort: Pulmonary effort is normal. No respiratory distress.     Breath sounds: Normal breath sounds. No wheezing, rhonchi or rales.  Chest:     Chest wall: No tenderness.  Abdominal:  Comments: + Tenderness and distention/bloating of the abdomen.  Increased bowel sounds  Musculoskeletal:        General: No swelling. Normal range of motion.  Skin:    General: Skin is warm and dry.  Neurological:     General: No focal deficit present.     Mental Status: She is alert and oriented to  person, place, and time.  Psychiatric:        Mood and Affect: Mood normal.        Behavior: Behavior normal.        Thought Content: Thought content normal.        Judgment: Judgment normal.     Comments: Mood seems a little better.     Adult ECG Report  Rate: 62 ;  Rhythm: normal sinus rhythm and lateral T wave version with poor R wave progression suggestive of prior anterior infarct. ;   Narrative Interpretation: Stable  Recent Labs: Labs ordered today.Unfortunately she ate both breakfast and lunch today.  Follow her labs will be to be drawn later.   From 05/04/2021: TC 204, TG 113, HDL 50, LDLc 128; Cr 1.1, Glu 107 - otherwise normal labs.      Latest Ref Rng & Units 05/04/2021   11:24 AM 01/09/2020    1:58 AM 01/08/2020    9:06 PM  CBC  WBC 3.4 - 10.8 x10E3/uL 6.4  9.6  11.1   Hemoglobin 11.1 - 15.9 g/dL 11.7  11.1  11.8   Hematocrit 34.0 - 46.6 % 35.2  31.9  34.8   Platelets 150 - 450 x10E3/uL 259  190  209     Lab Results  Component Value Date   HGBA1C 5.6 05/09/2016   Lab Results  Component Value Date   TSH 1.66 06/08/2021    ================================================== I spent a total of 67mnutes with the patient spent in direct patient consultation.  Additional time spent with chart review  / charting (studies, outside notes, etc): 14 min Total Time: 42 min  Current medicines are reviewed at length with the patient today.  (+/- concerns) n/a  Notice: This dictation was prepared with Dragon dictation along with smart phrase technology. Any transcriptional errors that result from this process are unintentional and may not be corrected upon review.  Studies Ordered:   Orders Placed This Encounter  Procedures   Lipid panel   Comprehensive metabolic panel   EKG [id number]  Meds ordered this encounter  Medications   nitroGLYCERIN (NITROSTAT) 0.4 MG SL tablet    Sig: Place 1 tablet (0.4 mg total) under the tongue every 5 (five) minutes x 3 doses as needed for  chest pain.    Dispense:  25 tablet    Refill:  6    Patient Instructions / Medication Changes & Studies & Tests Ordered   Patient Instructions  Medication Instructions:   No changes *If you need a refill on your cardiac medications before your next appointment, please call your pharmacy*   Lab Work: Lipid CMP    Testing/Procedures:  Not needed  Follow-Up: [facility] and your health needs are our priority.  As part of our continuing mission to provide you with exceptional heart care, we have created designated Provider Care Teams.  These Care Teams include your primary Cardiologist (physician) and Advanced Practice Providers (APPs -  Physician Assistants and Nurse Practitioners) who all work together to provide you with the care you need, when you need it.     Your  next appointment:   6 month(s)  The format for your next appointment:   In Person  Provider:   Caron Presume, PA-C or Diona Browner, NP    Then, Glenetta Hew, MD will plan to see you again in 12 month(s).   Other Instructions    If you are in Afib longer than 6 hours or more go to the [facility] ER  for possible  cardioversion.   ADDENDUM: Lab Results  Component Value Date   CHOL 188 11/29/2021   HDL 61 11/29/2021   LDLCALC 107 (H) 11/29/2021   TRIG 115 11/29/2021   CHOLHDL 3.1 11/29/2021   Lab Results  Component Value Date   CREATININE 1.39 (H) 11/29/2021   BUN 18 11/29/2021   NA 141 11/29/2021   K 5.0 11/29/2021   CL 102 11/29/2021   CO2 22 11/29/2021   LDL was not at goal.  She was eventually started on Repatha on December 4. Would like to have labs checked in March).      Leonie Man, MD, MS Glenetta Hew, M.D., M.S. Interventional Cardiologist  [place]  Pager # [phone] Phone # [phone] [address] [place], [place] [address]   Thank you for choosing [facility] [place]!!

## 2021-11-22 ENCOUNTER — Telehealth: Payer: Self-pay | Admitting: Cardiology

## 2021-11-22 NOTE — Telephone Encounter (Signed)
She can use any over the counter antihistamine (Zyrtec, Claritin, Allegra) as well as nasal sprays w/o issue.  There are a lot of bugs going around right now, and it could just be a matter of time for it to clear up.  If continues into next week check with her PCP

## 2021-11-22 NOTE — Telephone Encounter (Signed)
Patient is calling stating she has had a running nose since last week, she is wondering if there is something that can be prescribed to her to dry up her running nose.

## 2021-11-22 NOTE — Telephone Encounter (Signed)
Patient stated that for 4 days, her nose has been running. She has used saline nasal spry and zyrtec with no relief. She wants to know what is safe for her to take.

## 2021-11-22 NOTE — Telephone Encounter (Signed)
Patient informed of PharmD response:  "She can use any over the counter antihistamine (Zyrtec, Claritin, Allegra) as well as nasal sprays w/o issue.  There are a lot of bugs going around right now, and it could just be a matter of time for it to clear up.  If continues into next week check with her PCP"  Patient asked what to avoid. Explained to avoid benadryl, phenylephrine, pseudoephedrine. Recommended she speak with her local pharmacist for any questions regarding OTC medications. She verbalized understanding.

## 2021-11-29 DIAGNOSIS — I25710 Atherosclerosis of autologous vein coronary artery bypass graft(s) with unstable angina pectoris: Secondary | ICD-10-CM | POA: Diagnosis not present

## 2021-11-29 DIAGNOSIS — E785 Hyperlipidemia, unspecified: Secondary | ICD-10-CM | POA: Diagnosis not present

## 2021-11-30 LAB — COMPREHENSIVE METABOLIC PANEL
ALT: 19 IU/L (ref 0–32)
AST: 15 IU/L (ref 0–40)
Albumin/Globulin Ratio: 1.6 (ref 1.2–2.2)
Albumin: 4.5 g/dL (ref 3.9–4.9)
Alkaline Phosphatase: 108 IU/L (ref 44–121)
BUN/Creatinine Ratio: 13 (ref 12–28)
BUN: 18 mg/dL (ref 8–27)
Bilirubin Total: 0.4 mg/dL (ref 0.0–1.2)
CO2: 22 mmol/L (ref 20–29)
Calcium: 9.8 mg/dL (ref 8.7–10.3)
Chloride: 102 mmol/L (ref 96–106)
Creatinine, Ser: 1.39 mg/dL — ABNORMAL HIGH (ref 0.57–1.00)
Globulin, Total: 2.8 g/dL (ref 1.5–4.5)
Glucose: 106 mg/dL — ABNORMAL HIGH (ref 70–99)
Potassium: 5 mmol/L (ref 3.5–5.2)
Sodium: 141 mmol/L (ref 134–144)
Total Protein: 7.3 g/dL (ref 6.0–8.5)
eGFR: 42 mL/min/{1.73_m2} — ABNORMAL LOW (ref >59)

## 2021-11-30 LAB — LIPID PANEL
Chol/HDL Ratio: 3.1 ratio (ref 0.0–4.4)
Cholesterol, Total: 188 mg/dL (ref 100–199)
HDL: 61 mg/dL (ref >39)
LDL Chol Calc (NIH): 107 mg/dL — ABNORMAL HIGH (ref 0–99)
Triglycerides: 115 mg/dL (ref 0–149)
VLDL Cholesterol Cal: 20 mg/dL (ref 5–40)

## 2021-12-06 ENCOUNTER — Telehealth: Payer: Self-pay | Admitting: Pharmacist Clinician (PhC)/ Clinical Pharmacy Specialist

## 2021-12-06 NOTE — Telephone Encounter (Signed)
Repatha PA started 12/4  Key [id number]

## 2021-12-08 ENCOUNTER — Telehealth: Payer: Self-pay | Admitting: *Deleted

## 2021-12-08 DIAGNOSIS — R899 Unspecified abnormal finding in specimens from other organs, systems and tissues: Secondary | ICD-10-CM

## 2021-12-08 DIAGNOSIS — R7989 Other specified abnormal findings of blood chemistry: Secondary | ICD-10-CM

## 2021-12-08 NOTE — Telephone Encounter (Signed)
-----   Message from Leonie Man, MD sent at 12/04/2021 12:38 PM EST ----- Lab results show that LDL is better than in May. but still not anywhere near where it was 2 years ago. Basically the Zetia seems to be helping some, but would not likely be adequate to bring the cholesterol back down to want to be.  Hopefully this will help with prior authorization for Repatha.  Interesting, kidney function seems to be getting a little bit worse.  It is possible that this is because of being fasting and a little dehydrated side.  Would like to just check a basic chemistry panel in a couple weeks after trying to increase fluid intake (dry to drink 70-80 ounces of water per day).  Glenetta Hew, MD

## 2021-12-08 NOTE — Telephone Encounter (Signed)
The patient's husband( per dpr)  has been notified of the result and verbalized understanding.  All questions (if any) were answered.aware need to come to the office in 2 weeks for non fasting lab - BMP -  times given for lab ordering.  Aware to increase fluid intake.  Raiford Simmonds, RN 12/08/2021 6:24 PM

## 2021-12-12 ENCOUNTER — Encounter: Payer: Self-pay | Admitting: Cardiology

## 2021-12-12 DIAGNOSIS — I480 Paroxysmal atrial fibrillation: Secondary | ICD-10-CM | POA: Insufficient documentation

## 2021-12-12 DIAGNOSIS — D6869 Other thrombophilia: Secondary | ICD-10-CM | POA: Insufficient documentation

## 2021-12-12 NOTE — Assessment & Plan Note (Signed)
CHA2DS2-VASc score at minimum 3.  Stable on Xarelto.  Okay to hold Xarelto without bridging-1 to 2 days preprocedure.  For high risk procedures such as organ biopsies, and spinal procedures, can hold for 3 days if necessary.

## 2021-12-12 NOTE — Assessment & Plan Note (Signed)
Thankfully, she has not had any real breakthrough spells of A-fib.  She remains on twice daily Toprol.  Stable on Xarelto.  If she does go in A-fib and has a prolonged spell, recommendation would be to go to the ER for cardioversion.  At that time we may need to consider antiarrhythmics.  Otherwise if stable then simply follow-up as scheduled.

## 2021-12-12 NOTE — Assessment & Plan Note (Signed)
Now with only SVG-LAD staining.  The SVG to diagonal branch is occluded and being filled via collaterals.  Her original LIMA to immediately post CABG.  She now has an SVG to the LAD with stents placed.  Plan will reassess stress test in 2 to 3 years.

## 2021-12-12 NOTE — Assessment & Plan Note (Signed)
With all of her GI issues, she is using this as a crutch.  Not quite willing to quit.

## 2021-12-12 NOTE — Assessment & Plan Note (Addendum)
Based on her visit with Tommy Medal, [facility] in [facility], the plan was to start the process for Repatha.  I do not see it listed on her medication list.  She is taking Zetia alone. We will check labs to reassess in order to help facilitate getting Repatha approved. There was some "hiccups "in the process.  Hopefully before the end of the year this will be resolved.  Plan: Check lipids and continue Zetia for now.   Start Repatha when able.

## 2021-12-12 NOTE — Assessment & Plan Note (Signed)
Recent (January 2023) PCI to the SVG-LAD with resolution of of her somewhat atypical anginal symptoms.  Doing well since then.  She does have an occluded vein graft to a diagonal branch that is actually getting collaterals from the second diagonal branch.  Plan: Reassess Myoview in 2 to 3 years depending on symptoms.

## 2021-12-12 NOTE — Assessment & Plan Note (Signed)
Pretty complicated early course of CAD but did really well until the end of 2021 she started having a lot of anginal symptoms that were complicated and confusing because of her GI issues.  She underwent PCI of the SVG-LAD in January 2022 and has felt much better since then. I think she has had 2 STEMI's and a non-STEMI and her history but had done very well up until her most recent.  No longer on antiplatelet agent, on Xarelto alone.  Plan: On low-dose Toprol 25 mg twice daily to avoid hypotension. No longer on ARB because of hypotension in the past. She is on Zetia monotherapy for now.  Considering possibility of PCSK9 inhibitor Repatha. No antiplatelet agent-on Xarelto.

## 2021-12-17 ENCOUNTER — Encounter: Payer: Self-pay | Admitting: Pharmacist Clinician (PhC)/ Clinical Pharmacy Specialist

## 2021-12-17 MED ORDER — REPATHA SURECLICK 140 MG/ML ~~LOC~~ SOAJ: 140.0000 mg | SUBCUTANEOUS | 3 refills | Status: DC

## 2021-12-17 NOTE — Telephone Encounter (Signed)
PA approved with no end date,  Rx sent to [facility]

## 2021-12-21 ENCOUNTER — Other Ambulatory Visit: Payer: Self-pay

## 2021-12-21 DIAGNOSIS — R899 Unspecified abnormal finding in specimens from other organs, systems and tissues: Secondary | ICD-10-CM | POA: Diagnosis not present

## 2021-12-21 DIAGNOSIS — R7989 Other specified abnormal findings of blood chemistry: Secondary | ICD-10-CM | POA: Diagnosis not present

## 2021-12-21 LAB — BASIC METABOLIC PANEL
BUN/Creatinine Ratio: 15 (ref 12–28)
BUN: 15 mg/dL (ref 8–27)
CO2: 21 mmol/L (ref 20–29)
Calcium: 9.8 mg/dL (ref 8.7–10.3)
Chloride: 102 mmol/L (ref 96–106)
Creatinine, Ser: 0.97 mg/dL (ref 0.57–1.00)
Glucose: 76 mg/dL (ref 70–99)
Potassium: 4.7 mmol/L (ref 3.5–5.2)
Sodium: 138 mmol/L (ref 134–144)
eGFR: 64 mL/min/{1.73_m2} (ref >59)

## 2021-12-27 IMAGING — MG DIGITAL SCREENING BILAT W/ TOMO W/ CAD
8 series · 9 of 24 positions shown · non-contrast
Comparison: Previous exam(s).

CLINICAL DATA: Screening.

EXAM:
DIGITAL SCREENING BILATERAL MAMMOGRAM WITH TOMO AND CAD

[R MLO synth-2D]
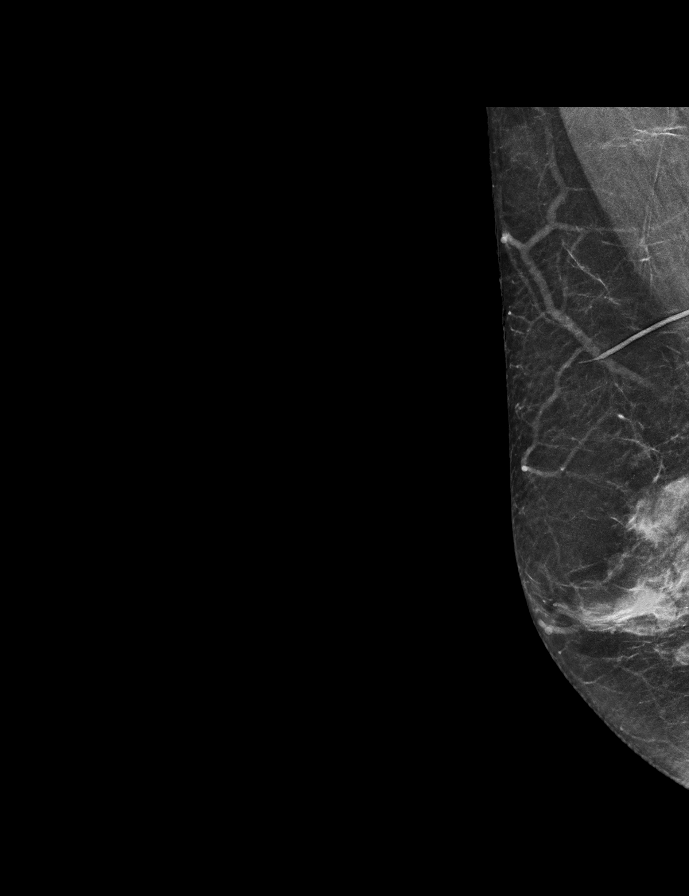

[L CC synth-2D]
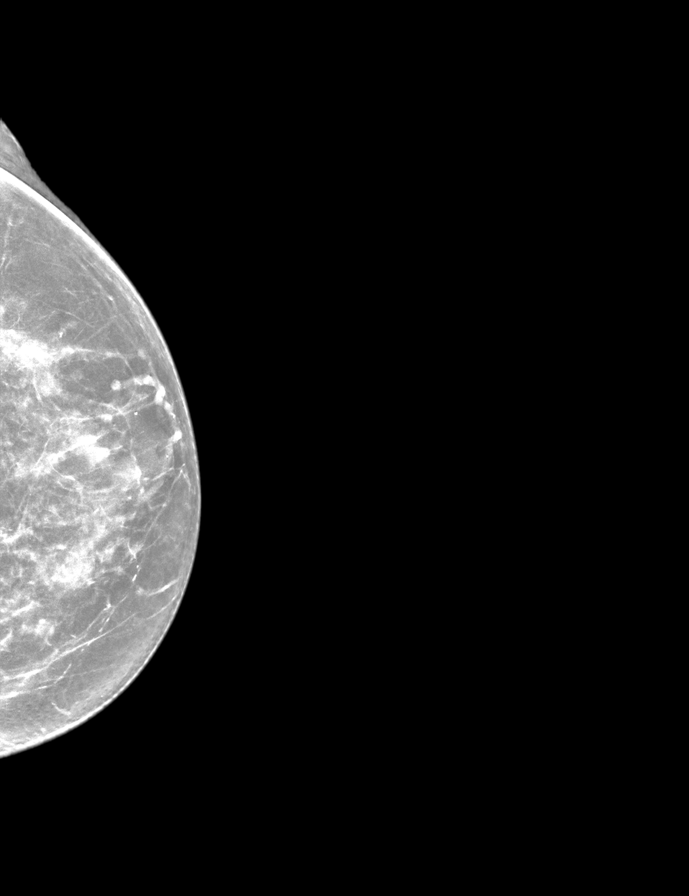

[R CC synth-2D]
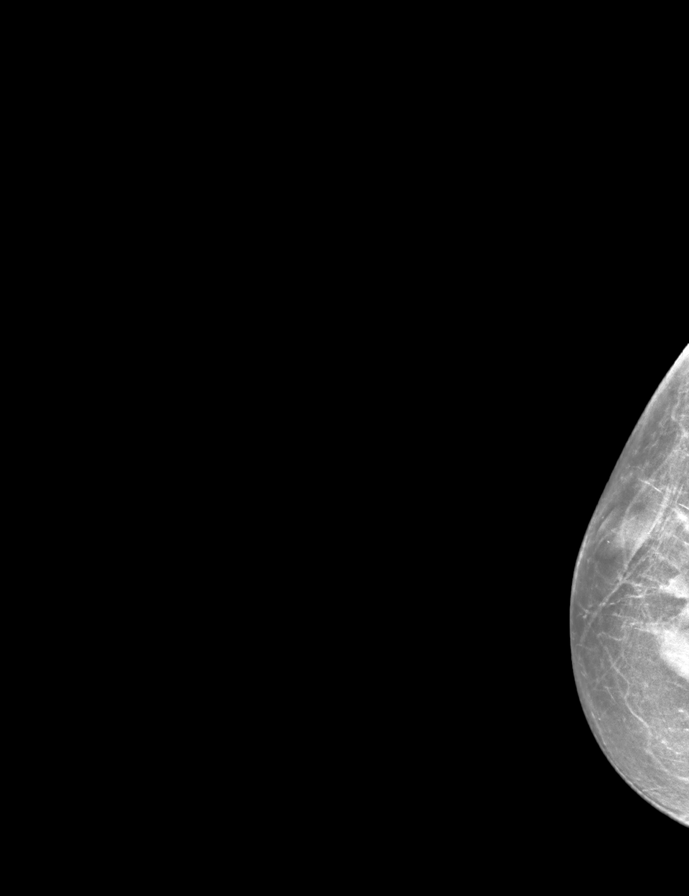

[L MLO synth-2D]
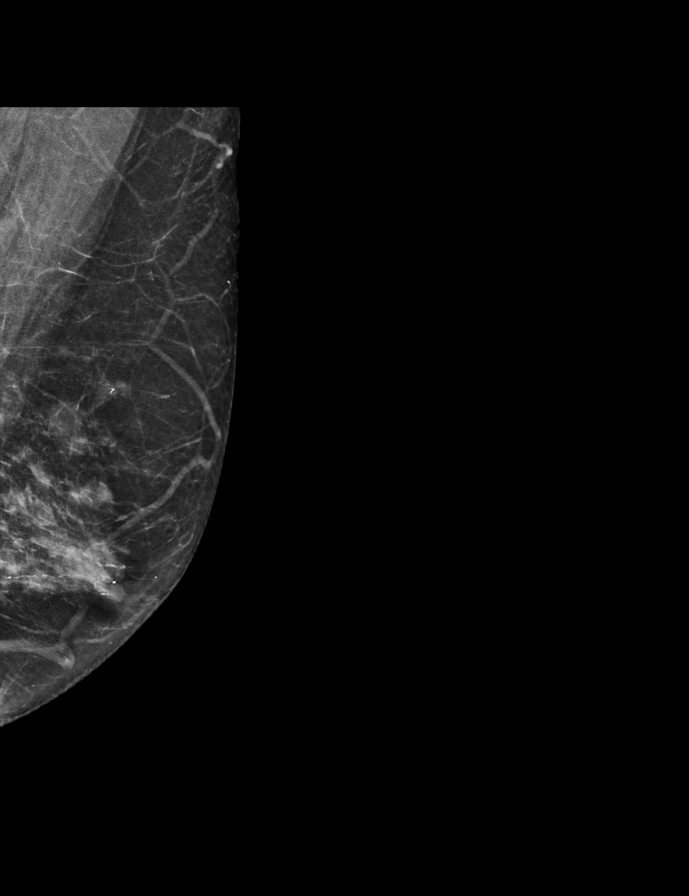

[L MLO tomo · 2 of 56 frames shown]
[frame 19/56]
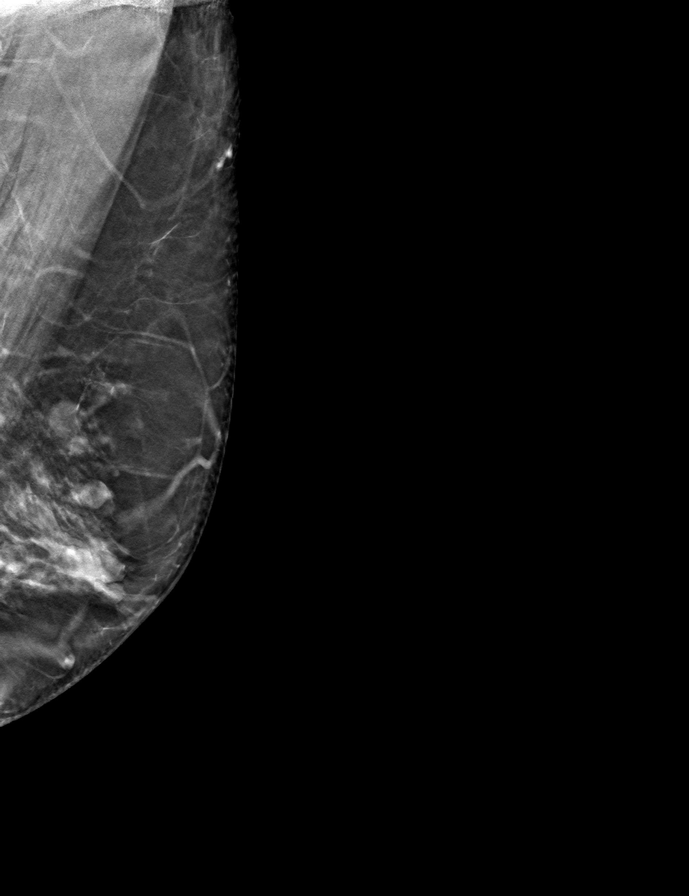
[frame 29/56]
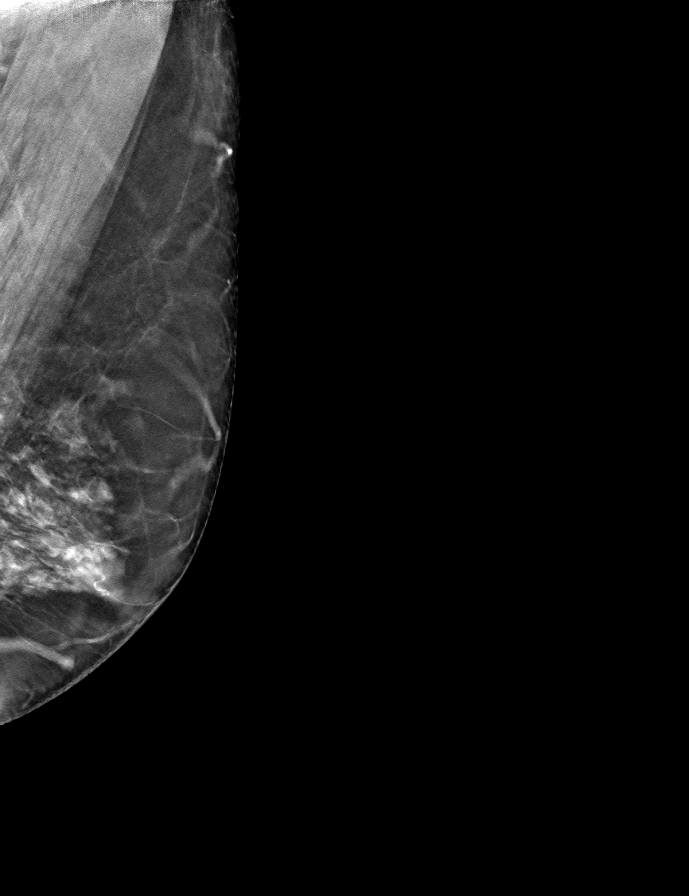

[L CC tomo · tomo slice 31/60.0]
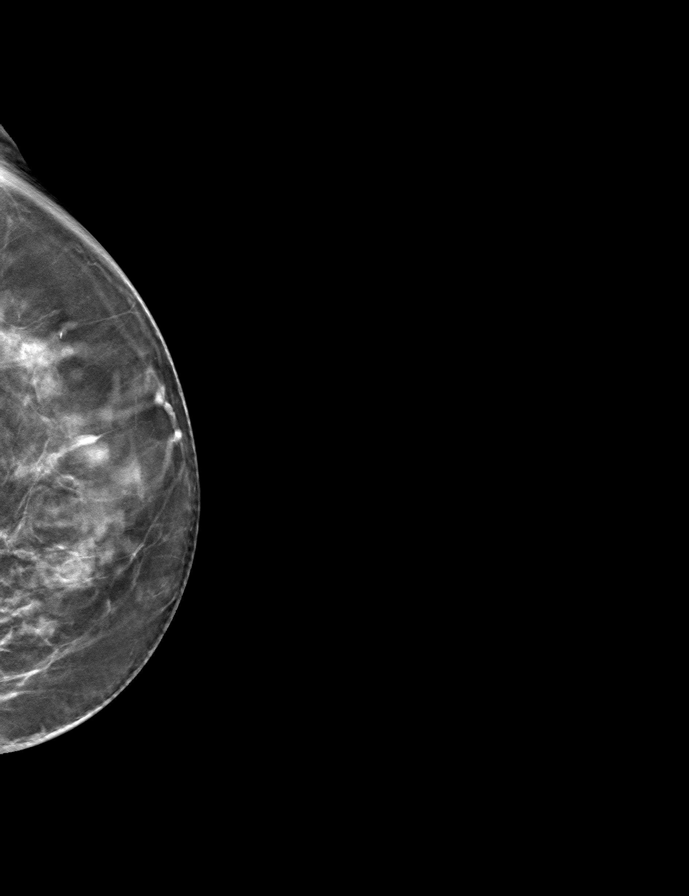

[R MLO tomo · tomo slice 27/52.0]
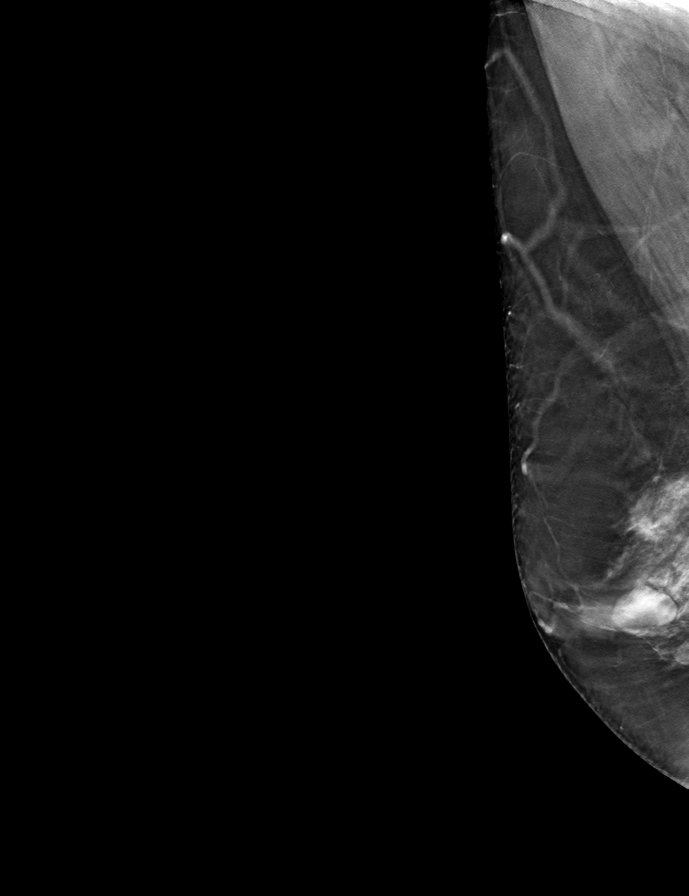

[R CC tomo · tomo slice 28/55.0]
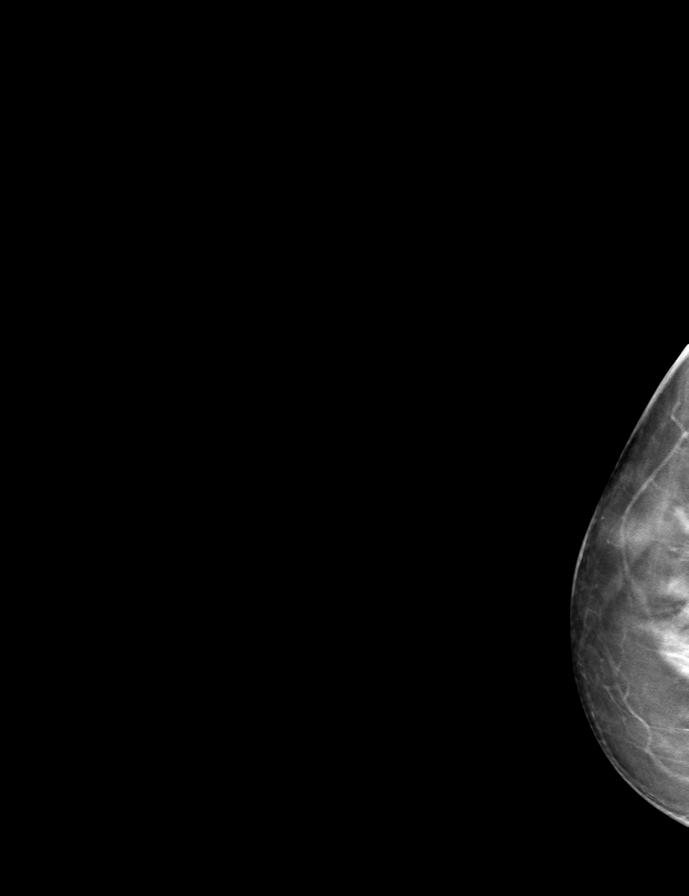

[9 of 24 positions shown; findings below may reference images not displayed]

ACR Breast Density Category c: The breast tissue is heterogeneously
dense, which may obscure small masses.
FINDINGS: There are no findings suspicious for malignancy. Images were
processed with CAD.
IMPRESSION: No mammographic evidence of malignancy. A result letter of this
screening mammogram will be mailed directly to the patient.

RECOMMENDATION:
Screening mammogram in one year. (Code:[id number])

BI-RADS CATEGORY  1: Negative.

## 2022-01-03 ENCOUNTER — Other Ambulatory Visit: Payer: Self-pay | Admitting: Cardiology

## 2022-01-22 ENCOUNTER — Other Ambulatory Visit: Payer: Self-pay

## 2022-01-22 ENCOUNTER — Encounter (HOSPITAL_COMMUNITY): Payer: Self-pay | Admitting: Emergency Medicine

## 2022-01-22 ENCOUNTER — Emergency Department (HOSPITAL_COMMUNITY): Payer: Medicare Other

## 2022-01-22 ENCOUNTER — Emergency Department (HOSPITAL_COMMUNITY)
Admission: EM | Admit: 2022-01-22 | Discharge: 2022-01-22 | Disposition: A | Discharge status: Home / Self Care | Payer: Medicare Other | Attending: Emergency Medicine | Admitting: Emergency Medicine

## 2022-01-22 DIAGNOSIS — R000 Tachycardia, unspecified: Secondary | ICD-10-CM | POA: Diagnosis not present

## 2022-01-22 DIAGNOSIS — Z7901 Long term (current) use of anticoagulants: Secondary | ICD-10-CM | POA: Insufficient documentation

## 2022-01-22 DIAGNOSIS — J449 Chronic obstructive pulmonary disease, unspecified: Secondary | ICD-10-CM | POA: Insufficient documentation

## 2022-01-22 DIAGNOSIS — U071 COVID-19: Secondary | ICD-10-CM | POA: Diagnosis not present

## 2022-01-22 DIAGNOSIS — M549 Dorsalgia, unspecified: Secondary | ICD-10-CM | POA: Diagnosis not present

## 2022-01-22 DIAGNOSIS — R070 Pain in throat: Secondary | ICD-10-CM | POA: Diagnosis not present

## 2022-01-22 DIAGNOSIS — R531 Weakness: Secondary | ICD-10-CM | POA: Diagnosis not present

## 2022-01-22 DIAGNOSIS — R197 Diarrhea, unspecified: Secondary | ICD-10-CM | POA: Diagnosis not present

## 2022-01-22 DIAGNOSIS — Z951 Presence of aortocoronary bypass graft: Secondary | ICD-10-CM | POA: Insufficient documentation

## 2022-01-22 DIAGNOSIS — E039 Hypothyroidism, unspecified: Secondary | ICD-10-CM | POA: Diagnosis not present

## 2022-01-22 DIAGNOSIS — I480 Paroxysmal atrial fibrillation: Secondary | ICD-10-CM | POA: Diagnosis not present

## 2022-01-22 DIAGNOSIS — R059 Cough, unspecified: Secondary | ICD-10-CM | POA: Diagnosis not present

## 2022-01-22 LAB — COMPREHENSIVE METABOLIC PANEL
ALT: 32 U/L (ref 0–44)
AST: 28 U/L (ref 15–41)
Albumin: 3.3 g/dL — ABNORMAL LOW (ref 3.5–5.0)
Alkaline Phosphatase: 68 U/L (ref 38–126)
Anion gap: 11 (ref 5–15)
BUN: 15 mg/dL (ref 8–23)
CO2: 19 mmol/L — ABNORMAL LOW (ref 22–32)
Calcium: 8.5 mg/dL — ABNORMAL LOW (ref 8.9–10.3)
Chloride: 106 mmol/L (ref 98–111)
Creatinine, Ser: 1.21 mg/dL — ABNORMAL HIGH (ref 0.44–1.00)
GFR, Estimated: 49 mL/min — ABNORMAL LOW (ref >60)
Glucose, Bld: 126 mg/dL — ABNORMAL HIGH (ref 70–99)
Potassium: 3.7 mmol/L (ref 3.5–5.1)
Sodium: 136 mmol/L (ref 135–145)
Total Bilirubin: 0.3 mg/dL (ref 0.3–1.2)
Total Protein: 6.8 g/dL (ref 6.5–8.1)

## 2022-01-22 LAB — CBC WITH DIFFERENTIAL/PLATELET
Abs Immature Granulocytes: 0.02 10*3/uL (ref 0.00–0.07)
Basophils Absolute: 0 10*3/uL (ref 0.0–0.1)
Basophils Relative: 0 %
Eosinophils Absolute: 0 10*3/uL (ref 0.0–0.5)
Eosinophils Relative: 0 %
HCT: 36.9 % (ref 36.0–46.0)
Hemoglobin: 11.5 g/dL — ABNORMAL LOW (ref 12.0–15.0)
Immature Granulocytes: 0 %
Lymphocytes Relative: 23 %
Lymphs Abs: 1.1 10*3/uL (ref 0.7–4.0)
MCH: 29.3 pg (ref 26.0–34.0)
MCHC: 31.2 g/dL (ref 30.0–36.0)
MCV: 94.1 fL (ref 80.0–100.0)
Monocytes Absolute: 0.5 10*3/uL (ref 0.1–1.0)
Monocytes Relative: 10 %
Neutro Abs: 3.1 10*3/uL (ref 1.7–7.7)
Neutrophils Relative %: 67 %
Platelets: 223 10*3/uL (ref 150–400)
RBC: 3.92 MIL/uL (ref 3.87–5.11)
RDW: 13.8 % (ref 11.5–15.5)
WBC: 4.7 10*3/uL (ref 4.0–10.5)
nRBC: 0 % (ref 0.0–0.2)

## 2022-01-22 LAB — RESP PANEL BY RT-PCR (RSV, FLU A&B, COVID)  RVPGX2
Influenza A by PCR: NEGATIVE
Influenza B by PCR: NEGATIVE
Resp Syncytial Virus by PCR: NEGATIVE
SARS Coronavirus 2 by RT PCR: POSITIVE — AB

## 2022-01-22 LAB — TSH: TSH: 2.988 u[IU]/mL (ref 0.350–4.500)

## 2022-01-22 LAB — TROPONIN I (HIGH SENSITIVITY)
Troponin I (High Sensitivity): 10 ng/L (ref <18)
Troponin I (High Sensitivity): 11 ng/L (ref <18)

## 2022-01-22 LAB — LIPASE, BLOOD: Lipase: 28 U/L (ref 11–51)

## 2022-01-22 LAB — MAGNESIUM: Magnesium: 2 mg/dL (ref 1.7–2.4)

## 2022-01-22 MED ORDER — SODIUM CHLORIDE 0.9 % IV BOLUS
1000.0000 mL | Freq: Once | INTRAVENOUS | Status: AC
  Administered 2022-01-22: 1000 mL via INTRAVENOUS

## 2022-01-22 MED ORDER — MOLNUPIRAVIR 200 MG PO CAPS: 4.0000 | ORAL_CAPSULE | Freq: Two times a day (BID) | ORAL | 0 refills | Status: AC

## 2022-01-22 NOTE — ED Provider Notes (Signed)
[facility] Provider Note   CSN: [id number] Arrival date & time: 01/22/22  1435     History  Chief Complaint  Patient presents with   Weakness    Dominique Barber is a 67 y.o. female.   Weakness Patient presents with weakness shortness of breath.  For around 3 days has had some nausea decreased oral intake little bit of vomiting.  Cough.  Reportedly was in A-fib with RVR for EMS.  Now in sinus rhythm.  States husband had similar symptoms and gave it to her.  States she would not be able to walk all the way to door if she needed to.  Has had decreased oral intake.  Feels lightheaded.    Past Medical History:  Diagnosis Date   ACS (acute coronary syndrome) ([place]) 01/06/2020   With progressive angina while troponin elevation--borderline-NON-STEMI   ANTERIOR STEMI of LAD (x2). 02/58/5277   Complicated by VF arrest, and anoxic brain injury with status epilepticus; POBA of LAD --> 6 months later, after 2 vessel CABG she had postop complication with occluded LIMA/second anterior STEMI   CAD in native artery & Grafts 01/1996   a) 1/'98: Ant STEMI => LAD POBA; b) 2/'98: 95% prox LAD @ POBA site --> BMS PCI (3.0 mm x 25 mm); c) 7/98 (5 months later) => BMS ISR of LAD--> CABGx2 (LIMA-LAD, SVG-D1 -> Emergent redo w/ SVG-LAD b/c acute LIMA occlusion); d) 01/2020: ACS -> DES PCI of SVG-LAD (75%&85%), TO of SVG-D1.   COPD (chronic obstructive pulmonary disease) (HCC)    GERD (gastroesophageal reflux disease)    Hiatal hernia    Hyperlipidemia    Hypothyroidism    On Synthroid   Iron deficiency anemia    Irritable bowel syndrome    Followed by Dr. Juanita Craver.   Ischemic cardiomyopathy 01/03/2020   ECHO (ACS): EF 45-50% (by Cath EF 40-45%) - Apical Inferior/Apical Anteroseptal & Apex akinetic, GR II DD.    PAF (paroxysmal atrial fibrillation) ([place]) 05/03/2014   New onset - originally with RVR   PONV (postoperative nausea and vomiting)    very  gag reflex   S/P CABG x 2; with EMERGENT Redo x 1. 07/1996   Initially LIMA-LAD, SVG-D1 --> immediate LIMA failure post CABG with anterior MI --> urgent redo SVG-LAD beyond D2.; Widely patent grafts as of 2005 with left dominant system. Graft to the diagonal branch has a very small target vessel but the vein graft to LAD retrograde fills a large bifurcating D2 and antegrade fills a large D3.   Tobacco abuse     Home Medications Prior to Admission medications   Medication Sig Start Date End Date Taking? Authorizing Provider  molnupiravir EUA (LAGEVRIO) 200 MG CAPS capsule Take 4 capsules (800 mg total) by mouth 2 (two) times daily for 5 days. 01/22/22 01/27/22 Yes Davonna Belling, MD  acetaminophen (TYLENOL) 500 MG tablet Take 500 mg by mouth every 6 (six) hours as needed for mild pain.    [provider]  albuterol (VENTOLIN HFA) 108 (90 Base) MCG/ACT inhaler Inhale 1-2 puffs into the lungs every 6 (six) hours as needed for wheezing or shortness of breath.    [provider]  Alpha-D-Galactosidase (BEANO PO) Take 2 capsules by mouth with breakfast, with lunch, and with evening meal.    [provider]  alum & mag hydroxide-simeth (MAALOX/MYLANTA) 200-200-20 MG/5ML suspension Take 15-30 mLs by mouth every 6 (six) hours as needed for indigestion or flatulence.  [provider]  Calcium Carb-Cholecalciferol (CALCIUM+D3 PO) Take 1 tablet by mouth at bedtime.    [provider]  cetirizine (ZYRTEC) 10 MG tablet TAKE 1 TABLET DAILY Patient taking differently: Take 10 mg by mouth daily. 11/27/17   Elayne Snare, MD  Evolocumab (REPATHA SURECLICK) 656 MG/ML SOAJ Inject 140 mg into the skin every 14 (fourteen) days. 12/17/21   Leonie Man, MD  ezetimibe (ZETIA) 10 MG tablet Take 1 tablet (10 mg total) by mouth daily. 08/17/21   Leonie Man, MD  LINZESS 145 MCG CAPS capsule Take 145 mcg by mouth daily. 05/09/21   [provider]  metoprolol  succinate (TOPROL-XL) 25 MG 24 hr tablet TAKE 1 TABLET TWICE A DAY (DISCONTINUE 50 MG) 01/03/22   Leonie Man, MD  nitroGLYCERIN (NITROSTAT) 0.4 MG SL tablet Place 1 tablet (0.4 mg total) under the tongue every 5 (five) minutes x 3 doses as needed for chest pain. 11/15/21   Leonie Man, MD  pantoprazole (PROTONIX) 40 MG tablet Take 40 mg by mouth daily before breakfast. 02/21/20   [provider]  potassium chloride (KLOR-CON) 10 MEQ tablet TAKE AS INSTRUCTED BY YOUR PRESCRIBER Patient taking differently: 20 mEq daily. 03/03/21   Leonie Man, MD  Probiotic Product (ALIGN) 4 MG CAPS Take 4 mg by mouth in the morning.    [provider]  Simethicone 125 MG TABS Take 125 mg by mouth in the morning, at noon, in the evening, and at bedtime.    [provider]  SYNTHROID 50 MCG tablet TAKE ONE AND ONE-HALF TABLETS DAILY BEFORE BREAKFAST 07/07/21   Elayne Snare, MD  XARELTO 20 MG TABS tablet TAKE 1 TABLET DAILY WITH SUPPER 02/22/21   Leonie Man, MD      Allergies    Contrast media [iodinated contrast media], Atorvastatin, Marinol [dronabinol], Rosuvastatin calcium, Simvastatin, Eluxadoline, and Iohexol    Review of Systems   Review of Systems  Neurological:  Positive for weakness.    Physical Exam Updated Vital Signs BP 127/76   Pulse 67   Temp 98.5 F (36.9 C) (Oral)   Resp 14   SpO2 100%  Physical Exam Nursing note reviewed.  Eyes:     Pupils: Pupils are equal, round, and reactive to light.  Cardiovascular:     Rate and Rhythm: Regular rhythm.  Pulmonary:     Comments: Harsh breath sounds without focal rales or rhonchi. Musculoskeletal:     Cervical back: Neck supple.  Skin:    Capillary Refill: Capillary refill takes less than 2 seconds.  Neurological:     Mental Status: She is alert and oriented to person, place, and time.     ED Results / Procedures / Treatments   Labs (all labs ordered are listed, but only abnormal results are  displayed) Labs Reviewed  RESP PANEL BY RT-PCR (RSV, FLU A&B, COVID)  [id number] - Abnormal; Notable for the following components:      Result Value   SARS Coronavirus 2 by RT PCR POSITIVE (*)    All other components within normal limits  COMPREHENSIVE METABOLIC PANEL - Abnormal; Notable for the following components:   CO2 19 (*)    Glucose, Bld 126 (*)    Creatinine, Ser 1.21 (*)    Calcium 8.5 (*)    Albumin 3.3 (*)    GFR, Estimated 49 (*)    All other components within normal limits  CBC WITH DIFFERENTIAL/PLATELET - Abnormal; Notable for the following  components:   Hemoglobin 11.5 (*)    All other components within normal limits  LIPASE, BLOOD  MAGNESIUM  TSH  URINALYSIS, ROUTINE W REFLEX MICROSCOPIC  TSH  TROPONIN I (HIGH SENSITIVITY)  TROPONIN I (HIGH SENSITIVITY)    EKG EKG Interpretation  Date/Time:  Saturday January 22 2022 14:57:55 EST Ventricular Rate:  95 PR Interval:  138 QRS Duration: 70 QT Interval:  326 QTC Calculation: 409 R Axis:   78 Text Interpretation: Normal sinus rhythm Anterior infarct , age undetermined T wave abnormality, consider inferolateral ischemia Abnormal ECG When compared with ECG of 08-Jan-2020 09:57,  improved T waves ins V2 and aVL Confirmed by Davonna Belling [phone]) on 01/22/2022 4:36:20 PM  Radiology DG Chest Portable 1 View  Result Date: 01/22/2022 CLINICAL DATA:  Cough EXAM: PORTABLE CHEST 1 VIEW COMPARISON:  01/06/2020 FINDINGS: Cardiomegaly status post median sternotomy and CABG. Both lungs are clear. The visualized skeletal structures are unremarkable. IMPRESSION: Cardiomegaly without acute abnormality of the lungs. No focal airspace opacity. Electronically Signed   By: Delanna Ahmadi M.D.   On: 01/22/2022 17:05    Procedures Procedures    Medications Ordered in ED Medications  sodium chloride 0.9 % bolus 1,000 mL (0 mLs Intravenous Stopped 01/22/22 1821)    ED Course/ Medical Decision Making/ A&P                              Medical Decision Making Amount and/or Complexity of Data Reviewed Radiology: ordered.  Risk Prescription drug management.   Patient with URI symptoms decreased oral intake and weakness.  Differential diagnosis includes viral syndromes pneumonia and dehydration.  Will get basic blood work.  Will get x-ray.  Will get viral testing.  Will give fluid bolus due to initial hypotension.  Feeling better after fluid bolus.  COVID-positive.  Not hypoxic.  Will discharge home.  Qualifies for antivirals.  Will give molnupiravir due to medication reactions.        Final Clinical Impression(s) / ED Diagnoses Final diagnoses:  [id number]    Rx / DC Orders ED Discharge Orders          Ordered    molnupiravir EUA (LAGEVRIO) 200 MG CAPS capsule  2 times daily        01/22/22 2010              Davonna Belling, MD 01/22/22 2328

## 2022-01-22 NOTE — ED Notes (Signed)
Pt A&OX4 ambulatory at d/c with independent steady gait. Pt verbalized understanding of d/c instructions, prescription and follow up care. 

## 2022-01-22 NOTE — ED Provider Triage Note (Signed)
Emergency Medicine Provider Triage Evaluation Note  Dominique Barber , a 67 y.o. female  was evaluated in triage.  Pt complains of weakness.  She is been feeling bad for the last 3 days, endorses nausea, vomiting, decreased oral intake.  She has been having a cough and bodyaches as well.  On Xarelto for Eliquis, called EMS due to feeling weak and was in AF with RVR.  Given liter of fluids and route to hospital, was initially hypotensive currently 91/70 in room..  Review of Systems  Per HPI  Physical Exam  BP 91/70 (BP Location: Left Arm)   Pulse 95   Temp 98.3 F (36.8 C) (Oral)   Resp 17   SpO2 100%  Gen:   Awake, no distress   Resp:  Normal effort  MSK:   Moves extremities without difficulty  Other:  Dry, pale, epigastric tenderness to palpation.    Medical Decision Making  Medically screening exam initiated at 2:56 PM.  Appropriate orders placed.  Dominique Barber was informed that the remainder of the evaluation will be completed by another provider, this initial triage assessment does not replace that evaluation, and the importance of remaining in the ED until their evaluation is complete.     Sherrill Raring, PA-C 01/22/22 1458

## 2022-01-22 NOTE — ED Triage Notes (Signed)
Pt BIB [facility] with reports of poor PO intake, cough, and non complaint with home medication. PT HR 140 en route.

## 2022-01-23 ENCOUNTER — Telehealth: Payer: Self-pay | Admitting: Physician Assistant

## 2022-01-23 NOTE — Telephone Encounter (Signed)
67 yo female. She was just dx with COVID and is on Molnupiravir. She called the answering service to see if she can take Tussin DM. I reviewed her medications. I could find no significant interactions. I reviewed her medical Hx. I see no contraindication. She can take Tussin DM. Richardson Dopp, PA-C    01/23/2022 12:53 PM

## 2022-01-24 ENCOUNTER — Ambulatory Visit: Payer: Medicare Other | Admitting: Family Medicine

## 2022-01-24 ENCOUNTER — Telehealth: Payer: Self-pay | Admitting: Family Medicine

## 2022-01-24 NOTE — Telephone Encounter (Signed)
Called pt to notify office visit appt for today 01/24/2022 With Dr. Redmond Pulling has been rescheduled (Covid  01/22/2022) to [facility] 01/28/22 @ 10:20. No answer, I Left a voice message and sent a text message w/ these details for pt.

## 2022-01-28 ENCOUNTER — Ambulatory Visit: Payer: Medicare Other | Admitting: Family Medicine

## 2022-02-18 ENCOUNTER — Telehealth: Payer: Self-pay | Admitting: *Deleted

## 2022-02-18 NOTE — Telephone Encounter (Signed)
Patient called to schedule MWV. Per patient husband they have other things going on and they would call us back.

## 2022-02-21 ENCOUNTER — Other Ambulatory Visit: Payer: Self-pay | Admitting: Cardiology

## 2022-02-21 NOTE — Telephone Encounter (Signed)
Prescription refill request for Xarelto received.  Indication: PAF Last office visit: 11/15/21  Roni Bread MD Weight: 70.6kg Age: 67 Scr: 1.21 on 01/22/22 CrCl: 59.97  Based on above findings Xarelto 74m daily is the appropriate dose.  Refill approved.

## 2022-05-13 ENCOUNTER — Telehealth: Payer: Self-pay | Admitting: Family Medicine

## 2022-05-13 NOTE — Telephone Encounter (Signed)
Called patient to schedule Medicare Annual Wellness Visit (AWV). Left message for patient to call back and schedule Medicare Annual Wellness Visit (AWV).  Last date of AWV: due 03/19/20 per snapshot  Please transfer to Fedora Knisely @ [phone]  Thank you ,  [name] AWV direct phone # [phone]

## 2022-05-16 ENCOUNTER — Telehealth: Payer: Self-pay | Admitting: Family Medicine

## 2022-05-16 NOTE — Telephone Encounter (Signed)
Contacted Dominique Barber to schedule their annual wellness visit. Appointment made for 05/19/22.  [name] AWV direct phone # [phone]*

## 2022-05-19 ENCOUNTER — Ambulatory Visit (INDEPENDENT_AMBULATORY_CARE_PROVIDER_SITE_OTHER): Payer: Medicare Other

## 2022-05-19 VITALS — Ht 62.0 in | Wt 152.0 lb

## 2022-05-19 DIAGNOSIS — Z0000 Encounter for general adult medical examination without abnormal findings: Secondary | ICD-10-CM | POA: Diagnosis not present

## 2022-05-19 DIAGNOSIS — Z1231 Encounter for screening mammogram for malignant neoplasm of breast: Secondary | ICD-10-CM

## 2022-05-19 NOTE — Progress Notes (Signed)
I connected with  [name] on 05/19/22 by a audio enabled telemedicine application and verified that I am speaking with the correct person using two identifiers. Spouse was also on call.  Patient Location: Home  Provider Location: Office/Clinic  I discussed the limitations of evaluation and management by telemedicine. The patient expressed understanding and agreed to proceed.  Subjective:   BRONWYN UDOH is a 67 y.o. female who presents for Medicare Annual (Subsequent) preventive examination.  Review of Systems     Cardiac Risk Factors include: advanced age (>10men, >58 women);dyslipidemia;hypertension     Objective:    Today's Vitals   05/19/22 1627  Weight: 152 lb (68.9 kg)  Height: 5\' 2"  (1.575 m)  PainSc: 4    Body mass index is 27.8 kg/m.     05/19/2022    4:36 PM 01/22/2022    2:49 PM 05/08/2020    7:47 AM 01/07/2020    5:22 AM 01/02/2020    6:03 PM 01/02/2020    6:14 AM 05/04/2014    3:38 AM  Advanced Directives  Does Patient Have a Medical Advance Directive? No No No No  No No  Would patient like information on creating a medical advance directive?   No - Patient declined Yes (Inpatient - patient requests chaplain consult to create a medical advance directive) No - Patient declined  No - patient declined information    Current Medications (verified) Outpatient Encounter Medications as of 05/19/2022  Medication Sig   acetaminophen (TYLENOL) 500 MG tablet Take 500 mg by mouth every 6 (six) hours as needed for mild pain.   albuterol (VENTOLIN HFA) 108 (90 Base) MCG/ACT inhaler Inhale 1-2 puffs into the lungs every 6 (six) hours as needed for wheezing or shortness of breath.   Alpha-D-Galactosidase (BEANO PO) Take 2 capsules by mouth with breakfast, with lunch, and with evening meal.   Calcium Carb-Cholecalciferol (CALCIUM+D3 PO) Take 1 tablet by mouth at bedtime.   cetirizine (ZYRTEC) 10 MG tablet TAKE 1 TABLET DAILY (Patient taking differently: Take 10 mg  by mouth daily.)   Evolocumab (REPATHA SURECLICK) 140 MG/ML SOAJ Inject 140 mg into the skin every 14 (fourteen) days.   LINZESS 145 MCG CAPS capsule Take 145 mcg by mouth daily.   metoprolol succinate (TOPROL-XL) 25 MG 24 hr tablet TAKE 1 TABLET TWICE A DAY (DISCONTINUE 50 MG)   nitroGLYCERIN (NITROSTAT) 0.4 MG SL tablet Place 1 tablet (0.4 mg total) under the tongue every 5 (five) minutes x 3 doses as needed for chest pain.   polyethylene glycol powder (GLYCOLAX/MIRALAX) 17 GM/SCOOP powder Take 1 Container by mouth once.   potassium chloride (KLOR-CON) 10 MEQ tablet TAKE AS INSTRUCTED BY YOUR PRESCRIBER (Patient taking differently: 20 mEq daily.)   Probiotic Product (ALIGN) 4 MG CAPS Take 4 mg by mouth in the morning.   rivaroxaban (XARELTO) 20 MG TABS tablet TAKE 1 TABLET DAILY WITH SUPPER   Simethicone 125 MG TABS Take 125 mg by mouth in the morning, at noon, in the evening, and at bedtime.   SYNTHROID 50 MCG tablet TAKE ONE AND ONE-HALF TABLETS DAILY BEFORE BREAKFAST   alum & mag hydroxide-simeth (MAALOX/MYLANTA) 200-200-20 MG/5ML suspension Take 15-30 mLs by mouth every 6 (six) hours as needed for indigestion or flatulence. (Patient not taking: Reported on 05/19/2022)   ezetimibe (ZETIA) 10 MG tablet Take 1 tablet (10 mg total) by mouth daily. (Patient not taking: Reported on 05/19/2022)   pantoprazole (PROTONIX) 40 MG tablet Take 40 mg by mouth  daily before breakfast. (Patient not taking: Reported on 05/19/2022)   No facility-administered encounter medications on file as of 05/19/2022.    Allergies (verified) Contrast media [iodinated contrast media], Atorvastatin, Marinol [dronabinol], Rosuvastatin calcium, Simvastatin, Eluxadoline, and Iohexol   History: Past Medical History:  Diagnosis Date   ACS (acute coronary syndrome) (HCC) 01/06/2020   With progressive angina while troponin elevation--borderline-NON-STEMI   ANTERIOR STEMI of LAD (x2). 01/19/1996   Complicated by VF arrest, and  anoxic brain injury with status epilepticus; POBA of LAD --> 6 months later, after 2 vessel CABG she had postop complication with occluded LIMA/second anterior STEMI   CAD in native artery & Grafts 01/1996   a) 1/'98: Ant STEMI => LAD POBA; b) 2/'98: 95% prox LAD @ POBA site --> BMS PCI (3.0 mm x 25 mm); c) 7/98 (5 months later) => BMS ISR of LAD--> CABGx2 (LIMA-LAD, SVG-D1 -> Emergent redo w/ SVG-LAD b/c acute LIMA occlusion); d) 01/2020: ACS -> DES PCI of SVG-LAD (75%&85%), TO of SVG-D1.   COPD (chronic obstructive pulmonary disease) (HCC)    GERD (gastroesophageal reflux disease)    Hiatal hernia    Hyperlipidemia    Hypothyroidism    On Synthroid   Iron deficiency anemia    Irritable bowel syndrome    Followed by Dr. Charna Elizabeth.   Ischemic cardiomyopathy 01/03/2020   ECHO (ACS): EF 45-50% (by Cath EF 40-45%) - Apical Inferior/Apical Anteroseptal & Apex akinetic, GR II DD.    PAF (paroxysmal atrial fibrillation) (HCC) 05/03/2014   New onset - originally with RVR   PONV (postoperative nausea and vomiting)    very gag reflex   S/P CABG x 2; with EMERGENT Redo x 1. 07/1996   Initially LIMA-LAD, SVG-D1 --> immediate LIMA failure post CABG with anterior MI --> urgent redo SVG-LAD beyond D2.; Widely patent grafts as of 2005 with left dominant system. Graft to the diagonal branch has a very small target vessel but the vein graft to LAD retrograde fills a large bifurcating D2 and antegrade fills a large D3.   Tobacco abuse    Past Surgical History:  Procedure Laterality Date   APPENDECTOMY     BIOPSY  05/08/2020   Procedure: BIOPSY;  Surgeon: Jeani Hawking, MD;  Location: [facility];  Service: Endoscopy;;   BREAST BIOPSY Right    CESAREAN SECTION     CHOLECYSTECTOMY     CORONARY ANGIOPLASTY  01/19/1996   Acute anterior STEMI- LAD POBA:  p-m LAD 70-80% narrowing & focal 95% stenosis in distal 3rd --> Treated with Emergent PTCA( POBA)  (Dr. Jonette Eva   CORONARY ANGIOPLASTY WITH STENT  PLACEMENT  02/26/1996   3.0x66mm Multi-Link stent to prox/mid LAD (stenosis at recent PTCA site) (Dr. Jonette Eva)   CORONARY ARTERY BYPASS GRAFT  07/31/1996   x2; LIMA to LAD and SVG to 1st diagonal (Dr. Molinda Bailiff)   CORONARY ARTERY BYPASS GRAFT  07/31/1996   x1; SVG to distal LAD (Dr. Molinda Bailiff)   CORONARY STENT INTERVENTION N/A 01/08/2020   Procedure: CORONARY STENT INTERVENTION;  Surgeon: Marykay Lex, MD;  Location: [facility];; , SVG-LAD severe focal disease with 75% followed by 85% calcified stenosis--DES PCI (Resolute Onyx 3.5 mm x 38 mm - 3.6 mm)   ESOPHAGOGASTRODUODENOSCOPY (EGD) WITH PROPOFOL N/A 05/08/2020   Procedure: ESOPHAGOGASTRODUODENOSCOPY (EGD) WITH PROPOFOL;  Surgeon: Jeani Hawking, MD;  Location: [facility];  Service: Endoscopy;  Laterality: N/A;   LEFT HEART CATH AND CORONARY ANGIOGRAPHY  07/30/1996  normal L main, LAD w/95% stenosis just before stent, diffuse 80-85% end-stent restenosis, 1st diagonal with70-75% ostial stenosis, large optional diagonal that was normal, Cfx with 2 marginals and distal PLA (all normal), RCA normal (Dr. Jonette Eva)   LEFT HEART CATH AND CORONARY ANGIOGRAPHY  01/19/1996   acute anterior wall MI, anoxic encephalopahty, VF; LCfx free of disease and dominant, RCA patent, L main short and patent, LAD with 70-80% narrowing and focal 95% stenosis in distal 3rd => PTCA  (Dr. Jonette Eva)   LEFT HEART CATH AND CORONARY ANGIOGRAPHY  01/18/2017   acute anterior wall MI, anoxic encephalopahty, VF; LCfx free of disease and dominant, RCA patent, L main short and patent, LAD with 70-80% narrowing and focal 95% stenosis in distal 3rd  (Dr. Jonette Eva)   LEFT HEART CATH AND CORS/GRAFTS ANGIOGRAPHY N/A 01/08/2020   Procedure: LEFT HEART CATH AND CORS/GRAFTS ANGIOGRAPHY;  Surgeon: Marykay Lex, MD;  Location: [facility]; EF 40 to 45%. Mildly elevated LVEDP. Proximal LAD 100% occlusion, mid-distal LAD fills via SVG that has collaterals to D1.  Normal dominant native LCx and nondominant RCA. SVG-D1 ostial 100% CTO, SVG-LAD severe focal disease with 75% followed by 85% calcified stenosis--DES PCI   LEFT HEART CATH AND CORS/GRAFTS ANGIOGRAPHY  01/31/2003   LAD totally occluded in prox 3rd, patent Cfx, SVG to mid LAd patent, SVG to small DX1 patent, LIMA to LAD was atretic and essentially occluded in junction of prox & mid 3rd, R barciocephalic and subclavian were normal (Dr. Jonette Eva)   [facility] PERF WALL MOTION  10/2010; 06/2014   a) LexiScan Cardiolite - mild perfusion defect r/t infarct/scar with mild periifarct ischemia in mid anterior & apical anterior region; EF 67%; abnormal, low risk study;; b) Small, moderate intensity Fixed perfusion defect - mid Anterior Wall c/w known Anterior MI. LOW RISK    OVARIAN CYST REMOVAL     TRANSTHORACIC ECHOCARDIOGRAM  02/2012; 06/2014   a) EF 50-55%, mild HK of anterospetal myocardium; mild MR;; b) EF 55-60%, mild HK of apical anterior wall.   Mild MR   TRANSTHORACIC ECHOCARDIOGRAM  01/02/2020   EF 45 to 50%. Apical inferior, apical septal and apex akinetic. GRII DD. Otherwise normal valves, normal RV. Normal atria. Normal pressures.   Family History  Problem Relation Age of Onset   Heart failure Other    Diabetes Other    Diabetes Mother    Breast cancer Mother    Thyroid disease Paternal Aunt    Thyroid disease Maternal Grandmother    Social History   Socioeconomic History   Marital status: Married    Spouse name: Not on file   Number of children: 3   Years of education: Not on file   Highest education level: Not on file  Occupational History    Employer: DISABLED  Tobacco Use   Smoking status: Former    Packs/day: .25    Types: Cigarettes    Quit date: 01/04/2020    Years since quitting: 2.3   Smokeless tobacco: Former  Building services engineer Use: Never used  Substance and Sexual Activity   Alcohol use: No    Alcohol/week: 0.0 standard drinks of alcohol   Drug use: Never    Sexual activity: Not on file  Other Topics Concern   Not on file  Social History Narrative   Married, mother of 3 children (2 living sons age 82-26 and 30-31; her daughter was the victim of a murder that took  place sometime around the time of the patient's MI. She was under significant amount of stress as her daughter had gone missing. She was never found. Finally the suspect was charged and convicted in 2009. She had sessile he stop smoking until that time frame when it brought back memories and she is now back to smoking a half pack a day.She does not get routine exercise, but is active around the house and doing housecleaning chores.She does not drink alcohol.         Family history: mgm died at 67, several maternal aunts and uncles all died in their 31's as well; mother didn't have any heart issues, had dementia, DM, died in her 49's; father lung cancer - smoker died at 61; 1 brother w/o heart issues; 2 living children - one with seizure disorder, no cholesterol issues known       Diet: doesn't eat much; has problems with GI/bloating since bypass; maybe 1-1.5 meals per day; oatmeal in the monings; sandwiches/soups in small amounts in the day; IBS issues more constipation; linzess, simethicone, maalox       Exercise:  housework   Social Determinants of Health   Financial Resource Strain: Low Risk  (05/19/2022)   Overall Financial Resource Strain (CARDIA)    Difficulty of Paying Living Expenses: Not hard at all  Food Insecurity: No Food Insecurity (05/19/2022)   Hunger Vital Sign    Worried About Running Out of Food in the Last Year: Never true    Ran Out of Food in the Last Year: Never true  Transportation Needs: No Transportation Needs (05/19/2022)   PRAPARE - Administrator, Civil Service (Medical): No    Lack of Transportation (Non-Medical): No  Physical Activity: Inactive (05/19/2022)   Exercise Vital Sign    Days of Exercise per Week: 0 days    Minutes of Exercise per  Session: 0 min  Stress: No Stress Concern Present (05/19/2022)   [employer] of Occupational Health - Occupational Stress Questionnaire    Feeling of Stress : Not at all  Social Connections: Not on file    Tobacco Counseling Counseling given: Not Answered   Clinical Intake:  Pre-visit preparation completed: Yes  Pain : 0-10 Pain Score: 4  Pain Type: Chronic pain Pain Location: Abdomen Pain Descriptors / Indicators: Aching, Discomfort Pain Onset: More than a month ago Pain Frequency: Constant     Nutritional Status: BMI 25 -29 Overweight Nutritional Risks: Nausea/ vomitting/ diarrhea (diarrhea and constipation) Diabetes: No  How often do you need to have someone help you when you read instructions, pamphlets, or other written materials from your doctor or pharmacy?: 1 - Never  Diabetic? no  Interpreter Needed?: No  Information entered by :: NAllen LPN   Activities of Daily Living    05/19/2022    4:36 PM  In your present state of health, do you have any difficulty performing the following activities:  Hearing? 0  Vision? 0  Difficulty concentrating or making decisions? 0  Walking or climbing stairs? 1  Dressing or bathing? 0  Doing errands, shopping? 0  Preparing Food and eating ? N  Using the Toilet? N  In the past six months, have you accidently leaked urine? N  Do you have problems with loss of bowel control? N  Managing your Medications? N  Managing your Finances? N  Housekeeping or managing your Housekeeping? N    Patient Care Team: Georganna Skeans, MD as PCP - General (Family Medicine) Bryan Lemma  W, MD as PCP - Cardiology (Cardiology)  Indicate any recent Medical Services you may have received from other than [facility] providers in the past year (date may be approximate).     Assessment:   This is a routine wellness examination for [facility].  Hearing/Vision screen Vision Screening - Comments:: Regular eye exams, [facility]  Dietary issues and  exercise activities discussed: Current Exercise Habits: The patient does not participate in regular exercise at present   Goals Addressed             This Visit's Progress    Patient Stated       05/19/2022, wants to have mammogram       Depression Screen    05/19/2022    4:36 PM 01/25/2021    5:17 PM 12/23/2020    3:26 PM 11/03/2020    3:57 PM 02/06/2019    3:06 PM  PHQ 2/9 Scores  PHQ - 2 Score 0 0 0 0 0  PHQ- 9 Score  0 1 0     Fall Risk    05/19/2022    4:36 PM 11/03/2020    3:52 PM 02/06/2019    3:06 PM  Fall Risk   Falls in the past year? 0 0 0  Number falls in past yr: 0  0  Injury with Fall? 0  0  Risk for fall due to : Medication side effect    Follow up Falls prevention discussed;Education provided;Falls evaluation completed      FALL RISK PREVENTION PERTAINING TO THE HOME:  Any stairs in or around the home? Yes  If so, are there any without handrails? No  Home free of loose throw rugs in walkways, pet beds, electrical cords, etc? Yes  Adequate lighting in your home to reduce risk of falls? Yes   ASSISTIVE DEVICES UTILIZED TO PREVENT FALLS:  Life alert? No  Use of a cane, walker or w/c? No  Grab bars in the bathroom? No  Shower chair or bench in shower? No  Elevated toilet seat or a handicapped toilet? No   TIMED UP AND GO:  Was the test performed? No .      Cognitive Function:        05/19/2022    4:37 PM  6CIT Screen  What Year? 0 points  What month? 0 points  What time? 0 points  Count back from 20 0 points  Months in reverse 0 points  Repeat phrase 0 points  Total Score 0 points    Immunizations Immunization History  Administered Date(s) Administered   Influenza,inj,Quad PF,6+ Mos 12/04/2013, 12/05/2014, 10/10/2017   Influenza-Unspecified 01/03/2010   Tdap 01/03/2013    TDAP status: Up to date  Flu Vaccine status: Due, Education has been provided regarding the importance of this vaccine. Advised may receive this vaccine at  local pharmacy or Health Dept. Aware to provide a copy of the vaccination record if obtained from local pharmacy or Health Dept. Verbalized acceptance and understanding.  Pneumococcal vaccine status: Due, Education has been provided regarding the importance of this vaccine. Advised may receive this vaccine at local pharmacy or Health Dept. Aware to provide a copy of the vaccination record if obtained from local pharmacy or Health Dept. Verbalized acceptance and understanding.  Covid-19 vaccine status: Information provided on how to obtain vaccines.   Qualifies for Shingles Vaccine? Yes   Zostavax completed No   Shingrix Completed?: No.    Education has been provided regarding the importance of this vaccine. Patient has  been advised to call insurance company to determine out of pocket expense if they have not yet received this vaccine. Advised may also receive vaccine at local pharmacy or Health Dept. Verbalized acceptance and understanding.  Screening Tests Health Maintenance  Topic Date Due   COVID-19 Vaccine (1) Never done   Zoster Vaccines- Shingrix (1 of 2) Never done   Medicare Annual Wellness (AWV)  03/19/2020   Pneumonia Vaccine 6+ Years old (1 of 1 - PCV) Never done   DEXA SCAN  07/07/2020   MAMMOGRAM  04/01/2021   INFLUENZA VACCINE  08/04/2022   DTaP/Tdap/Td (2 - Td or Tdap) 01/04/2023   COLONOSCOPY (Pts 45-69yrs Insurance coverage will need to be confirmed)  09/08/2026   Hepatitis C Screening  Completed   HPV VACCINES  Aged Out    Health Maintenance  Health Maintenance Due  Topic Date Due   COVID-19 Vaccine (1) Never done   Zoster Vaccines- Shingrix (1 of 2) Never done   Medicare Annual Wellness (AWV)  03/19/2020   Pneumonia Vaccine 54+ Years old (1 of 1 - PCV) Never done   DEXA SCAN  07/07/2020   MAMMOGRAM  04/01/2021    Colorectal cancer screening: Type of screening: Colonoscopy. Completed 09/07/2016. Repeat every 10 years  Mammogram status: Ordered today. Pt  provided with contact info and advised to call to schedule appt.   Bone Density status: due  Lung Cancer Screening: (Low Dose CT Chest recommended if Age 15-80 years, 30 pack-year currently smoking OR have quit w/in 15years.) does not qualify.   Lung Cancer Screening Referral: no  Additional Screening:  Hepatitis C Screening: does qualify; Completed 02/06/2019  Vision Screening: Recommended annual ophthalmology exams for early detection of glaucoma and other disorders of the eye. Is the patient up to date with their annual eye exam?  Yes  Who is the provider or what is the name of the office in which the patient attends annual eye exams? [facility] If pt is not established with a provider, would they like to be referred to a provider to establish care? No .   Dental Screening: Recommended annual dental exams for proper oral hygiene  Community Resource Referral / Chronic Care Management: CRR required this visit?  No   CCM required this visit?  No      Plan:     I have personally reviewed and noted the following in the patient's chart:   Medical and social history Use of alcohol, tobacco or illicit drugs  Current medications and supplements including opioid prescriptions. Patient is not currently taking opioid prescriptions. Functional ability and status Nutritional status Physical activity Advanced directives List of other physicians Hospitalizations, surgeries, and ER visits in previous 12 months Vitals Screenings to include cognitive, depression, and falls Referrals and appointments  In addition, I have reviewed and discussed with patient certain preventive protocols, quality metrics, and best practice recommendations. A written personalized care plan for preventive services as well as general preventive health recommendations were provided to patient.     Barb Merino, LPN   [id number]/61/[id number]   Nurse Notes: none  Due to this being a virtual visit, the after visit summary  with patients personalized plan was offered to patient via mail or my-chart. Patient would like to access on my-chart/

## 2022-05-19 NOTE — Patient Instructions (Signed)
Dominique Barber , Thank you for taking time to come for your Medicare Wellness Visit. I appreciate your ongoing commitment to your health goals. Please review the following plan we discussed and let me know if I can assist you in the future.   These are the goals we discussed:  Goals      Patient Stated     05/19/2022, wants to have mammogram        This is a list of the screening recommended for you and due dates:  Health Maintenance  Topic Date Due   COVID-19 Vaccine (1) Never done   Zoster (Shingles) Vaccine (1 of 2) Never done   Pneumonia Vaccine (1 of 1 - PCV) Never done   DEXA scan (bone density measurement)  07/07/2020   Mammogram  04/01/2021   Flu Shot  08/04/2022   DTaP/Tdap/Td vaccine (2 - Td or Tdap) 01/04/2023   Medicare Annual Wellness Visit  05/19/2023   Colon Cancer Screening  09/08/2026   Hepatitis C Screening: USPSTF Recommendation to screen - Ages 18-79 yo.  Completed   HPV Vaccine  Aged Out    Advanced directives: Advance directive discussed with you today.   Conditions/risks identified: none  Next appointment: Follow up in one year for your annual wellness visit    Preventive Care 65 Years and Older, Female Preventive care refers to lifestyle choices and visits with your health care provider that can promote health and wellness. What does preventive care include? A yearly physical exam. This is also called an annual well check. Dental exams once or twice a year. Routine eye exams. Ask your health care provider how often you should have your eyes checked. Personal lifestyle choices, including: Daily care of your teeth and gums. Regular physical activity. Eating a healthy diet. Avoiding tobacco and drug use. Limiting alcohol use. Practicing safe sex. Taking low-dose aspirin every day. Taking vitamin and mineral supplements as recommended by your health care provider. What happens during an annual well check? The services and screenings done by your  health care provider during your annual well check will depend on your age, overall health, lifestyle risk factors, and family history of disease. Counseling  Your health care provider may ask you questions about your: Alcohol use. Tobacco use. Drug use. Emotional well-being. Home and relationship well-being. Sexual activity. Eating habits. History of falls. Memory and ability to understand (cognition). Work and work Astronomer. Reproductive health. Screening  You may have the following tests or measurements: Height, weight, and BMI. Blood pressure. Lipid and cholesterol levels. These may be checked every 5 years, or more frequently if you are over 37 years old. Skin check. Lung cancer screening. You may have this screening every year starting at age 27 if you have a 30-pack-year history of smoking and currently smoke or have quit within the past 15 years. Fecal occult blood test (FOBT) of the stool. You may have this test every year starting at age 11. Flexible sigmoidoscopy or colonoscopy. You may have a sigmoidoscopy every 5 years or a colonoscopy every 10 years starting at age 56. Hepatitis C blood test. Hepatitis B blood test. Sexually transmitted disease (STD) testing. Diabetes screening. This is done by checking your blood sugar (glucose) after you have not eaten for a while (fasting). You may have this done every 1-3 years. Bone density scan. This is done to screen for osteoporosis. You may have this done starting at age 59. Mammogram. This may be done every 1-2 years. Talk to your  health care provider about how often you should have regular mammograms. Talk with your health care provider about your test results, treatment options, and if necessary, the need for more tests. Vaccines  Your health care provider may recommend certain vaccines, such as: Influenza vaccine. This is recommended every year. Tetanus, diphtheria, and acellular pertussis (Tdap, Td) vaccine. You may  need a Td booster every 10 years. Zoster vaccine. You may need this after age 38. Pneumococcal 13-valent conjugate (PCV13) vaccine. One dose is recommended after age 26. Pneumococcal polysaccharide (PPSV23) vaccine. One dose is recommended after age 17. Talk to your health care provider about which screenings and vaccines you need and how often you need them. This information is not intended to replace advice given to you by your health care provider. Make sure you discuss any questions you have with your health care provider. Document Released: 01/16/2015 Document Revised: 09/09/2015 Document Reviewed: 10/21/2014 Elsevier Interactive Patient Education  2017 [place] Prevention in the Home Falls can cause injuries. They can happen to people of all ages. There are many things you can do to make your home safe and to help prevent falls. What can I do on the outside of my home? Regularly fix the edges of walkways and driveways and fix any cracks. Remove anything that might make you trip as you walk through a door, such as a raised step or threshold. Trim any bushes or trees on the path to your home. Use bright outdoor lighting. Clear any walking paths of anything that might make someone trip, such as rocks or tools. Regularly check to see if handrails are loose or broken. Make sure that both sides of any steps have handrails. Any raised decks and porches should have guardrails on the edges. Have any leaves, snow, or ice cleared regularly. Use sand or salt on walking paths during winter. Clean up any spills in your garage right away. This includes oil or grease spills. What can I do in the bathroom? Use night lights. Install grab bars by the toilet and in the tub and shower. Do not use towel bars as grab bars. Use non-skid mats or decals in the tub or shower. If you need to sit down in the shower, use a plastic, non-slip stool. Keep the floor dry. Clean up any water that spills on  the floor as soon as it happens. Remove soap buildup in the tub or shower regularly. Attach bath mats securely with double-sided non-slip rug tape. Do not have throw rugs and other things on the floor that can make you trip. What can I do in the bedroom? Use night lights. Make sure that you have a light by your bed that is easy to reach. Do not use any sheets or blankets that are too big for your bed. They should not hang down onto the floor. Have a firm chair that has side arms. You can use this for support while you get dressed. Do not have throw rugs and other things on the floor that can make you trip. What can I do in the kitchen? Clean up any spills right away. Avoid walking on wet floors. Keep items that you use a lot in easy-to-reach places. If you need to reach something above you, use a strong step stool that has a grab bar. Keep electrical cords out of the way. Do not use floor polish or wax that makes floors slippery. If you must use wax, use non-skid floor wax. Do not have  throw rugs and other things on the floor that can make you trip. What can I do with my stairs? Do not leave any items on the stairs. Make sure that there are handrails on both sides of the stairs and use them. Fix handrails that are broken or loose. Make sure that handrails are as long as the stairways. Check any carpeting to make sure that it is firmly attached to the stairs. Fix any carpet that is loose or worn. Avoid having throw rugs at the top or bottom of the stairs. If you do have throw rugs, attach them to the floor with carpet tape. Make sure that you have a light switch at the top of the stairs and the bottom of the stairs. If you do not have them, ask someone to add them for you. What else can I do to help prevent falls? Wear shoes that: Do not have high heels. Have rubber bottoms. Are comfortable and fit you well. Are closed at the toe. Do not wear sandals. If you use a stepladder: Make sure  that it is fully opened. Do not climb a closed stepladder. Make sure that both sides of the stepladder are locked into place. Ask someone to hold it for you, if possible. Clearly mark and make sure that you can see: Any grab bars or handrails. First and last steps. Where the edge of each step is. Use tools that help you move around (mobility aids) if they are needed. These include: Canes. Walkers. Scooters. Crutches. Turn on the lights when you go into a dark area. Replace any light bulbs as soon as they burn out. Set up your furniture so you have a clear path. Avoid moving your furniture around. If any of your floors are uneven, fix them. If there are any pets around you, be aware of where they are. Review your medicines with your doctor. Some medicines can make you feel dizzy. This can increase your chance of falling. Ask your doctor what other things that you can do to help prevent falls. This information is not intended to replace advice given to you by your health care provider. Make sure you discuss any questions you have with your health care provider. Document Released: 10/16/2008 Document Revised: 05/28/2015 Document Reviewed: 01/24/2014 Elsevier Interactive Patient Education  2017 [employer].

## 2022-05-24 ENCOUNTER — Encounter: Payer: Self-pay | Admitting: Family Medicine

## 2022-05-24 ENCOUNTER — Ambulatory Visit (INDEPENDENT_AMBULATORY_CARE_PROVIDER_SITE_OTHER): Payer: Medicare Other | Admitting: Family Medicine

## 2022-05-24 VITALS — BP 122/81 | HR 81 | Temp 98.1°F | Resp 16 | Ht 62.0 in | Wt 155.6 lb

## 2022-05-24 DIAGNOSIS — D259 Leiomyoma of uterus, unspecified: Secondary | ICD-10-CM | POA: Diagnosis not present

## 2022-05-24 DIAGNOSIS — Z1159 Encounter for screening for other viral diseases: Secondary | ICD-10-CM | POA: Diagnosis not present

## 2022-05-24 DIAGNOSIS — R140 Abdominal distension (gaseous): Secondary | ICD-10-CM | POA: Diagnosis not present

## 2022-05-24 DIAGNOSIS — R1013 Epigastric pain: Secondary | ICD-10-CM

## 2022-05-24 NOTE — Progress Notes (Signed)
Established Patient Office Visit  Subjective    Patient ID: Dominique Barber, female    DOB: 12-19-1955  Age: 67 y.o. MRN: [id number]  CC: No chief complaint on file.   HPI Dominique Barber presents for follow up of chronic med issues. Patient reports that she has not yet followed up on fibroids.    Outpatient Encounter Medications as of 05/24/2022  Medication Sig   acetaminophen (TYLENOL) 500 MG tablet Take 500 mg by mouth every 6 (six) hours as needed for mild pain.   albuterol (VENTOLIN HFA) 108 (90 Base) MCG/ACT inhaler Inhale 1-2 puffs into the lungs every 6 (six) hours as needed for wheezing or shortness of breath.   Alpha-D-Galactosidase (BEANO PO) Take 2 capsules by mouth with breakfast, with lunch, and with evening meal.   alum & mag hydroxide-simeth (MAALOX/MYLANTA) 200-200-20 MG/5ML suspension Take 15-30 mLs by mouth every 6 (six) hours as needed for indigestion or flatulence.   Calcium Carb-Cholecalciferol (CALCIUM+D3 PO) Take 1 tablet by mouth at bedtime.   cetirizine (ZYRTEC) 10 MG tablet TAKE 1 TABLET DAILY (Patient taking differently: Take 10 mg by mouth daily.)   Evolocumab (REPATHA SURECLICK) 140 MG/ML SOAJ Inject 140 mg into the skin every 14 (fourteen) days.   LINZESS 145 MCG CAPS capsule Take 145 mcg by mouth daily.   metoprolol succinate (TOPROL-XL) 25 MG 24 hr tablet TAKE 1 TABLET TWICE A DAY (DISCONTINUE 50 MG)   nitroGLYCERIN (NITROSTAT) 0.4 MG SL tablet Place 1 tablet (0.4 mg total) under the tongue every 5 (five) minutes x 3 doses as needed for chest pain.   polyethylene glycol powder (GLYCOLAX/MIRALAX) 17 GM/SCOOP powder Take 1 Container by mouth once.   potassium chloride (KLOR-CON) 10 MEQ tablet TAKE AS INSTRUCTED BY YOUR PRESCRIBER (Patient taking differently: 20 mEq daily.)   Probiotic Product (ALIGN) 4 MG CAPS Take 4 mg by mouth in the morning.   rivaroxaban (XARELTO) 20 MG TABS tablet TAKE 1 TABLET DAILY WITH SUPPER   Simethicone 125 MG TABS Take 125  mg by mouth in the morning, at noon, in the evening, and at bedtime.   SYNTHROID 50 MCG tablet TAKE ONE AND ONE-HALF TABLETS DAILY BEFORE BREAKFAST   [DISCONTINUED] ezetimibe (ZETIA) 10 MG tablet Take 1 tablet (10 mg total) by mouth daily. (Patient not taking: Reported on 05/19/2022)   [DISCONTINUED] pantoprazole (PROTONIX) 40 MG tablet Take 40 mg by mouth daily before breakfast. (Patient not taking: Reported on 05/19/2022)   No facility-administered encounter medications on file as of 05/24/2022.    Past Medical History:  Diagnosis Date   ACS (acute coronary syndrome) (HCC) 01/06/2020   With progressive angina while troponin elevation--borderline-NON-STEMI   ANTERIOR STEMI of LAD (x2). 01/19/1996   Complicated by VF arrest, and anoxic brain injury with status epilepticus; POBA of LAD --> 6 months later, after 2 vessel CABG she had postop complication with occluded LIMA/second anterior STEMI   CAD in native artery & Grafts 01/1996   a) 1/'98: Ant STEMI => LAD POBA; b) 2/'98: 95% prox LAD @ POBA site --> BMS PCI (3.0 mm x 25 mm); c) 7/98 (5 months later) => BMS ISR of LAD--> CABGx2 (LIMA-LAD, SVG-D1 -> Emergent redo w/ SVG-LAD b/c acute LIMA occlusion); d) 01/2020: ACS -> DES PCI of SVG-LAD (75%&85%), TO of SVG-D1.   COPD (chronic obstructive pulmonary disease) (HCC)    GERD (gastroesophageal reflux disease)    Hiatal hernia    Hyperlipidemia    Hypothyroidism    On Synthroid  Iron deficiency anemia    Irritable bowel syndrome    Followed by Dr. Charna Elizabeth.   Ischemic cardiomyopathy 01/03/2020   ECHO (ACS): EF 45-50% (by Cath EF 40-45%) - Apical Inferior/Apical Anteroseptal & Apex akinetic, GR II DD.    PAF (paroxysmal atrial fibrillation) (HCC) 05/03/2014   New onset - originally with RVR   PONV (postoperative nausea and vomiting)    very gag reflex   S/P CABG x 2; with EMERGENT Redo x 1. 07/1996   Initially LIMA-LAD, SVG-D1 --> immediate LIMA failure post CABG with anterior MI -->  urgent redo SVG-LAD beyond D2.; Widely patent grafts as of 2005 with left dominant system. Graft to the diagonal branch has a very small target vessel but the vein graft to LAD retrograde fills a large bifurcating D2 and antegrade fills a large D3.   Tobacco abuse     Past Surgical History:  Procedure Laterality Date   APPENDECTOMY     BIOPSY  05/08/2020   Procedure: BIOPSY;  Surgeon: Jeani Hawking, MD;  Location: [facility];  Service: Endoscopy;;   BREAST BIOPSY Right    CESAREAN SECTION     CHOLECYSTECTOMY     CORONARY ANGIOPLASTY  01/19/1996   Acute anterior STEMI- LAD POBA:  p-m LAD 70-80% narrowing & focal 95% stenosis in distal 3rd --> Treated with Emergent PTCA( POBA)  (Dr. Jonette Eva   CORONARY ANGIOPLASTY WITH STENT PLACEMENT  02/26/1996   3.0x67mm Multi-Link stent to prox/mid LAD (stenosis at recent PTCA site) (Dr. Jonette Eva)   CORONARY ARTERY BYPASS GRAFT  07/31/1996   x2; LIMA to LAD and SVG to 1st diagonal (Dr. Molinda Bailiff)   CORONARY ARTERY BYPASS GRAFT  07/31/1996   x1; SVG to distal LAD (Dr. Molinda Bailiff)   CORONARY STENT INTERVENTION N/A 01/08/2020   Procedure: CORONARY STENT INTERVENTION;  Surgeon: Marykay Lex, MD;  Location: [facility];; , SVG-LAD severe focal disease with 75% followed by 85% calcified stenosis--DES PCI (Resolute Onyx 3.5 mm x 38 mm - 3.6 mm)   ESOPHAGOGASTRODUODENOSCOPY (EGD) WITH PROPOFOL N/A 05/08/2020   Procedure: ESOPHAGOGASTRODUODENOSCOPY (EGD) WITH PROPOFOL;  Surgeon: Jeani Hawking, MD;  Location: [facility];  Service: Endoscopy;  Laterality: N/A;   LEFT HEART CATH AND CORONARY ANGIOGRAPHY  07/30/1996   normal L main, LAD w/95% stenosis just before stent, diffuse 80-85% end-stent restenosis, 1st diagonal with70-75% ostial stenosis, large optional diagonal that was normal, Cfx with 2 marginals and distal PLA (all normal), RCA normal (Dr. Jonette Eva)   LEFT HEART CATH AND CORONARY ANGIOGRAPHY  01/19/1996   acute anterior wall MI, anoxic  encephalopahty, VF; LCfx free of disease and dominant, RCA patent, L main short and patent, LAD with 70-80% narrowing and focal 95% stenosis in distal 3rd => PTCA  (Dr. Jonette Eva)   LEFT HEART CATH AND CORONARY ANGIOGRAPHY  01/18/2017   acute anterior wall MI, anoxic encephalopahty, VF; LCfx free of disease and dominant, RCA patent, L main short and patent, LAD with 70-80% narrowing and focal 95% stenosis in distal 3rd  (Dr. Jonette Eva)   LEFT HEART CATH AND CORS/GRAFTS ANGIOGRAPHY N/A 01/08/2020   Procedure: LEFT HEART CATH AND CORS/GRAFTS ANGIOGRAPHY;  Surgeon: Marykay Lex, MD;  Location: [facility]; EF 40 to 45%. Mildly elevated LVEDP. Proximal LAD 100% occlusion, mid-distal LAD fills via SVG that has collaterals to D1. Normal dominant native LCx and nondominant RCA. SVG-D1 ostial 100% CTO, SVG-LAD severe focal disease with 75% followed by 85% calcified  stenosis--DES PCI   LEFT HEART CATH AND CORS/GRAFTS ANGIOGRAPHY  01/31/2003   LAD totally occluded in prox 3rd, patent Cfx, SVG to mid LAd patent, SVG to small DX1 patent, LIMA to LAD was atretic and essentially occluded in junction of prox & mid 3rd, R barciocephalic and subclavian were normal (Dr. Jonette Eva)   [facility] PERF WALL MOTION  10/2010; 06/2014   a) LexiScan Cardiolite - mild perfusion defect r/t infarct/scar with mild periifarct ischemia in mid anterior & apical anterior region; EF 67%; abnormal, low risk study;; b) Small, moderate intensity Fixed perfusion defect - mid Anterior Wall c/w known Anterior MI. LOW RISK    OVARIAN CYST REMOVAL     TRANSTHORACIC ECHOCARDIOGRAM  02/2012; 06/2014   a) EF 50-55%, mild HK of anterospetal myocardium; mild MR;; b) EF 55-60%, mild HK of apical anterior wall.   Mild MR   TRANSTHORACIC ECHOCARDIOGRAM  01/02/2020   EF 45 to 50%. Apical inferior, apical septal and apex akinetic. GRII DD. Otherwise normal valves, normal RV. Normal atria. Normal pressures.    Family History  Problem  Relation Age of Onset   Heart failure Other    Diabetes Other    Diabetes Mother    Breast cancer Mother    Thyroid disease Paternal Aunt    Thyroid disease Maternal Grandmother     Social History   Socioeconomic History   Marital status: Married    Spouse name: Not on file   Number of children: 3   Years of education: Not on file   Highest education level: Not on file  Occupational History    Employer: DISABLED  Tobacco Use   Smoking status: Former    Packs/day: .25    Types: Cigarettes    Quit date: 01/04/2020    Years since quitting: 2.3   Smokeless tobacco: Former  Building services engineer Use: Never used  Substance and Sexual Activity   Alcohol use: No    Alcohol/week: 0.0 standard drinks of alcohol   Drug use: Never   Sexual activity: Not on file  Other Topics Concern   Not on file  Social History Narrative   Married, mother of 3 children (2 living sons age 77-26 and 30-31; her daughter was the victim of a murder that took place sometime around the time of the patient's MI. She was under significant amount of stress as her daughter had gone missing. She was never found. Finally the suspect was charged and convicted in 2009. She had sessile he stop smoking until that time frame when it brought back memories and she is now back to smoking a half pack a day.She does not get routine exercise, but is active around the house and doing housecleaning chores.She does not drink alcohol.         Family history: mgm died at 54, several maternal aunts and uncles all died in their 41's as well; mother didn't have any heart issues, had dementia, DM, died in her 30's; father lung cancer - smoker died at 53; 1 brother w/o heart issues; 2 living children - one with seizure disorder, no cholesterol issues known       Diet: doesn't eat much; has problems with GI/bloating since bypass; maybe 1-1.5 meals per day; oatmeal in the monings; sandwiches/soups in small amounts in the day; IBS issues more  constipation; linzess, simethicone, maalox       Exercise:  housework   Social Determinants of Corporate investment banker Strain: Low  Risk  (05/19/2022)   Overall Financial Resource Strain (CARDIA)    Difficulty of Paying Living Expenses: Not hard at all  Food Insecurity: No Food Insecurity (05/19/2022)   Hunger Vital Sign    Worried About Running Out of Food in the Last Year: Never true    Ran Out of Food in the Last Year: Never true  Transportation Needs: No Transportation Needs (05/19/2022)   PRAPARE - Administrator, Civil Service (Medical): No    Lack of Transportation (Non-Medical): No  Physical Activity: Inactive (05/19/2022)   Exercise Vital Sign    Days of Exercise per Week: 0 days    Minutes of Exercise per Session: 0 min  Stress: No Stress Concern Present (05/19/2022)   [employer] of Occupational Health - Occupational Stress Questionnaire    Feeling of Stress : Not at all  Social Connections: Not on file  Intimate Partner Violence: Not on file    Review of Systems  All other systems reviewed and are negative.       Objective    BP 122/81   Pulse 81   Temp 98.1 F (36.7 C) (Oral)   Resp 16   Ht 5\' 2"  (1.575 m)   Wt 155 lb 9.6 oz (70.6 kg)   SpO2 95%   BMI 28.46 kg/m   Physical Exam Abdominal:     General: There is distension.     Tenderness: There is abdominal tenderness in the epigastric area.         Assessment & Plan:   1. Uterine leiomyoma, unspecified location Referral to GYN for follow up.  - Ambulatory referral to Gynecology  2. Epigastric pain Keep scheduled follow up appt with GI  3. Abdominal bloating Referrals as above - Ambulatory referral to Gynecology  4. Need for hepatitis C screening test     No follow-ups on file.   Tommie Raymond, MD

## 2022-05-25 ENCOUNTER — Ambulatory Visit (INDEPENDENT_AMBULATORY_CARE_PROVIDER_SITE_OTHER): Payer: Medicare Other

## 2022-05-25 DIAGNOSIS — R1013 Epigastric pain: Secondary | ICD-10-CM | POA: Diagnosis not present

## 2022-05-25 DIAGNOSIS — D259 Leiomyoma of uterus, unspecified: Secondary | ICD-10-CM

## 2022-05-25 DIAGNOSIS — R140 Abdominal distension (gaseous): Secondary | ICD-10-CM

## 2022-05-25 DIAGNOSIS — R109 Unspecified abdominal pain: Secondary | ICD-10-CM | POA: Diagnosis not present

## 2022-05-27 ENCOUNTER — Encounter: Payer: Self-pay | Admitting: Family Medicine

## 2022-06-02 ENCOUNTER — Other Ambulatory Visit: Payer: Self-pay | Admitting: Endocrinology

## 2022-06-02 ENCOUNTER — Other Ambulatory Visit (INDEPENDENT_AMBULATORY_CARE_PROVIDER_SITE_OTHER): Payer: Medicare Other

## 2022-06-02 DIAGNOSIS — E890 Postprocedural hypothyroidism: Secondary | ICD-10-CM

## 2022-06-03 LAB — T4, FREE: Free T4: 1.12 ng/dL (ref 0.60–1.60)

## 2022-06-03 LAB — TSH: TSH: 1.32 u[IU]/mL (ref 0.35–5.50)

## 2022-06-07 ENCOUNTER — Other Ambulatory Visit: Payer: Self-pay | Admitting: Cardiology

## 2022-06-09 ENCOUNTER — Ambulatory Visit: Payer: Medicare Other | Admitting: Endocrinology

## 2022-06-13 ENCOUNTER — Ambulatory Visit (INDEPENDENT_AMBULATORY_CARE_PROVIDER_SITE_OTHER): Payer: Medicare Other | Admitting: Endocrinology

## 2022-06-13 VITALS — BP 122/78 | HR 81 | Ht 62.0 in | Wt 157.0 lb

## 2022-06-13 DIAGNOSIS — E890 Postprocedural hypothyroidism: Secondary | ICD-10-CM | POA: Diagnosis not present

## 2022-06-13 NOTE — Progress Notes (Unsigned)
Patient ID: Dominique Barber, female   DOB: Nov 02, 1955, 67 y.o.   MRN: [id number]   Reason for Appointment: Thyroid follow-up    History of Present Illness:   Her hypothyroidism was first diagnosed in 1999 after radioactive iodine treatment for toxic nodular goiter Subsequently she became hypothyroid and has been on thyroid supplementation  She has been on the same dose about 11/2014 when her TSH was lower than usual at 0.13 Her dose was reduced from 100 mcg down to 75 g levothyroxine  She feels okay with her energy level lately Her only symptoms are chronic GI problems            The patient is taking on Synthroid brand name for some time Because of fear of allergy from dyes she is taking 50 mcg Synthroid tablet and using 1-1/2 tablets daily  Not taking any calcium or iron containing vitamins at the same time in the morning and has been regular with the medication  TSH has been normal consistently  Lab Results  Component Value Date   TSH 1.32 06/03/2022   TSH 2.988 01/22/2022   TSH 1.66 06/08/2021   FREET4 1.12 06/03/2022   FREET4 1.29 06/08/2021   FREET4 1.28 05/18/2020   Wt Readings from Last 3 Encounters:  06/13/22 157 lb (71.2 kg)  05/24/22 155 lb 9.6 oz (70.6 kg)  05/19/22 152 lb (68.9 kg)     GOITER:   She has had a multinodular goiter since her 70s. Details of this are also not available at present. She recalls that she has had a benign biopsy in the past   No symptoms of difficulty swallowing or local pressure in the neck   Allergies as of 06/13/2022       Reactions   Contrast Media [iodinated Contrast Media] Hives   red   Atorvastatin Other (See Comments)   Gas pain in abdomen with no relief   Marinol [dronabinol]    "Made me feel high and almost pass out"   Rosuvastatin Calcium Other (See Comments)   Worsening GI upset   Simvastatin    Myalgia & GI upset   Eluxadoline Nausea And Vomiting, Other (See Comments)   Viberzi- And, made the  patient feel "high"   Iohexol Hives, Rash   Red dye        Medication List        Accurate as of June 13, 2022  3:55 PM. If you have any questions, ask your nurse or doctor.          acetaminophen 500 MG tablet Commonly known as: TYLENOL Take 500 mg by mouth every 6 (six) hours as needed for mild pain.   albuterol 108 (90 Base) MCG/ACT inhaler Commonly known as: VENTOLIN HFA Inhale 1-2 puffs into the lungs every 6 (six) hours as needed for wheezing or shortness of breath.   Align 4 MG Caps Take 4 mg by mouth in the morning.   alum & mag hydroxide-simeth 200-200-20 MG/5ML suspension Commonly known as: MAALOX/MYLANTA Take 15-30 mLs by mouth every 6 (six) hours as needed for indigestion or flatulence.   BEANO PO Take 2 capsules by mouth with breakfast, with lunch, and with evening meal.   CALCIUM+D3 PO Take 1 tablet by mouth at bedtime.   cetirizine 10 MG tablet Commonly known as: ZYRTEC TAKE 1 TABLET DAILY   Linzess 145 MCG Caps capsule Generic drug: linaclotide Take 145 mcg by mouth daily.   metoprolol succinate 25 MG 24 hr  tablet Commonly known as: TOPROL-XL TAKE 1 TABLET TWICE A DAY (DISCONTINUE 50 MG)   nitroGLYCERIN 0.4 MG SL tablet Commonly known as: NITROSTAT Place 1 tablet (0.4 mg total) under the tongue every 5 (five) minutes x 3 doses as needed for chest pain.   polyethylene glycol powder 17 GM/SCOOP powder Commonly known as: GLYCOLAX/MIRALAX Take 1 Container by mouth once.   potassium chloride 10 MEQ tablet Commonly known as: KLOR-CON TAKE AS INSTRUCTED BY YOUR PRESCRIBER   Repatha SureClick 140 MG/ML Soaj Generic drug: Evolocumab Inject 140 mg into the skin every 14 (fourteen) days.   Simethicone 125 MG Tabs Take 125 mg by mouth in the morning, at noon, in the evening, and at bedtime.   Synthroid 50 MCG tablet Generic drug: levothyroxine TAKE ONE AND ONE-HALF TABLETS DAILY BEFORE BREAKFAST   Xarelto 20 MG Tabs tablet Generic drug:  rivaroxaban TAKE 1 TABLET DAILY WITH SUPPER        Allergies:  Allergies  Allergen Reactions   Contrast Media [Iodinated Contrast Media] Hives    red   Atorvastatin Other (See Comments)    Gas pain in abdomen with no relief   Marinol [Dronabinol]     "Made me feel high and almost pass out"   Rosuvastatin Calcium Other (See Comments)    Worsening GI upset   Simvastatin     Myalgia & GI upset   Eluxadoline Nausea And Vomiting and Other (See Comments)    Viberzi- And, made the patient feel "high"   Iohexol Hives and Rash    Red dye    Past Medical History:  Diagnosis Date   ACS (acute coronary syndrome) (HCC) 01/06/2020   With progressive angina while troponin elevation--borderline-NON-STEMI   ANTERIOR STEMI of LAD (x2). 01/19/1996   Complicated by VF arrest, and anoxic brain injury with status epilepticus; POBA of LAD --> 6 months later, after 2 vessel CABG she had postop complication with occluded LIMA/second anterior STEMI   CAD in native artery & Grafts 01/1996   a) 1/'98: Ant STEMI => LAD POBA; b) 2/'98: 95% prox LAD @ POBA site --> BMS PCI (3.0 mm x 25 mm); c) 7/98 (5 months later) => BMS ISR of LAD--> CABGx2 (LIMA-LAD, SVG-D1 -> Emergent redo w/ SVG-LAD b/c acute LIMA occlusion); d) 01/2020: ACS -> DES PCI of SVG-LAD (75%&85%), TO of SVG-D1.   COPD (chronic obstructive pulmonary disease) (HCC)    GERD (gastroesophageal reflux disease)    Hiatal hernia    Hyperlipidemia    Hypothyroidism    On Synthroid   Iron deficiency anemia    Irritable bowel syndrome    Followed by Dr. Charna Elizabeth.   Ischemic cardiomyopathy 01/03/2020   ECHO (ACS): EF 45-50% (by Cath EF 40-45%) - Apical Inferior/Apical Anteroseptal & Apex akinetic, GR II DD.    PAF (paroxysmal atrial fibrillation) (HCC) 05/03/2014   New onset - originally with RVR   PONV (postoperative nausea and vomiting)    very gag reflex   S/P CABG x 2; with EMERGENT Redo x 1. 07/1996   Initially LIMA-LAD, SVG-D1 -->  immediate LIMA failure post CABG with anterior MI --> urgent redo SVG-LAD beyond D2.; Widely patent grafts as of 2005 with left dominant system. Graft to the diagonal branch has a very small target vessel but the vein graft to LAD retrograde fills a large bifurcating D2 and antegrade fills a large D3.   Tobacco abuse     Past Surgical History:  Procedure Laterality Date   APPENDECTOMY  BIOPSY  05/08/2020   Procedure: BIOPSY;  Surgeon: Jeani Hawking, MD;  Location: [facility];  Service: Endoscopy;;   BREAST BIOPSY Right    CESAREAN SECTION     CHOLECYSTECTOMY     CORONARY ANGIOPLASTY  01/19/1996   Acute anterior STEMI- LAD POBA:  p-m LAD 70-80% narrowing & focal 95% stenosis in distal 3rd --> Treated with Emergent PTCA( POBA)  (Dr. Jonette Eva   CORONARY ANGIOPLASTY WITH STENT PLACEMENT  02/26/1996   3.0x81mm Multi-Link stent to prox/mid LAD (stenosis at recent PTCA site) (Dr. Jonette Eva)   CORONARY ARTERY BYPASS GRAFT  07/31/1996   x2; LIMA to LAD and SVG to 1st diagonal (Dr. Molinda Bailiff)   CORONARY ARTERY BYPASS GRAFT  07/31/1996   x1; SVG to distal LAD (Dr. Molinda Bailiff)   CORONARY STENT INTERVENTION N/A 01/08/2020   Procedure: CORONARY STENT INTERVENTION;  Surgeon: Marykay Lex, MD;  Location: [facility];; , SVG-LAD severe focal disease with 75% followed by 85% calcified stenosis--DES PCI (Resolute Onyx 3.5 mm x 38 mm - 3.6 mm)   ESOPHAGOGASTRODUODENOSCOPY (EGD) WITH PROPOFOL N/A 05/08/2020   Procedure: ESOPHAGOGASTRODUODENOSCOPY (EGD) WITH PROPOFOL;  Surgeon: Jeani Hawking, MD;  Location: [facility];  Service: Endoscopy;  Laterality: N/A;   LEFT HEART CATH AND CORONARY ANGIOGRAPHY  07/30/1996   normal L main, LAD w/95% stenosis just before stent, diffuse 80-85% end-stent restenosis, 1st diagonal with70-75% ostial stenosis, large optional diagonal that was normal, Cfx with 2 marginals and distal PLA (all normal), RCA normal (Dr. Jonette Eva)   LEFT HEART CATH AND CORONARY  ANGIOGRAPHY  01/19/1996   acute anterior wall MI, anoxic encephalopahty, VF; LCfx free of disease and dominant, RCA patent, L main short and patent, LAD with 70-80% narrowing and focal 95% stenosis in distal 3rd => PTCA  (Dr. Jonette Eva)   LEFT HEART CATH AND CORONARY ANGIOGRAPHY  01/18/2017   acute anterior wall MI, anoxic encephalopahty, VF; LCfx free of disease and dominant, RCA patent, L main short and patent, LAD with 70-80% narrowing and focal 95% stenosis in distal 3rd  (Dr. Jonette Eva)   LEFT HEART CATH AND CORS/GRAFTS ANGIOGRAPHY N/A 01/08/2020   Procedure: LEFT HEART CATH AND CORS/GRAFTS ANGIOGRAPHY;  Surgeon: Marykay Lex, MD;  Location: [facility]; EF 40 to 45%. Mildly elevated LVEDP. Proximal LAD 100% occlusion, mid-distal LAD fills via SVG that has collaterals to D1. Normal dominant native LCx and nondominant RCA. SVG-D1 ostial 100% CTO, SVG-LAD severe focal disease with 75% followed by 85% calcified stenosis--DES PCI   LEFT HEART CATH AND CORS/GRAFTS ANGIOGRAPHY  01/31/2003   LAD totally occluded in prox 3rd, patent Cfx, SVG to mid LAd patent, SVG to small DX1 patent, LIMA to LAD was atretic and essentially occluded in junction of prox & mid 3rd, R barciocephalic and subclavian were normal (Dr. Jonette Eva)   NM [facility] PERF WALL MOTION  10/2010; 06/2014   a) LexiScan Cardiolite - mild perfusion defect r/t infarct/scar with mild periifarct ischemia in mid anterior & apical anterior region; EF 67%; abnormal, low risk study;; b) Small, moderate intensity Fixed perfusion defect - mid Anterior Wall c/w known Anterior MI. LOW RISK    OVARIAN CYST REMOVAL     TRANSTHORACIC ECHOCARDIOGRAM  02/2012; 06/2014   a) EF 50-55%, mild HK of anterospetal myocardium; mild MR;; b) EF 55-60%, mild HK of apical anterior wall.   Mild MR   TRANSTHORACIC ECHOCARDIOGRAM  01/02/2020   EF 45 to 50%. Apical inferior, apical  septal and apex akinetic. GRII DD. Otherwise normal valves, normal RV. Normal  atria. Normal pressures.    Family History  Problem Relation Age of Onset   Heart failure Other    Diabetes Other    Diabetes Mother    Breast cancer Mother    Thyroid disease Paternal Aunt    Thyroid disease Maternal Grandmother     Social History:  reports that she quit smoking about 2 years ago. Her smoking use included cigarettes. She smoked an average of .25 packs per day. She has quit using smokeless tobacco. She reports that she does not drink alcohol and does not use drugs.  REVIEW Of SYSTEMS:  History of hyperlipidemia: Followed by cardiologist and taking Crestor   Lab Results  Component Value Date   CHOL 188 11/29/2021   HDL 61 11/29/2021   LDLCALC 107 (H) 11/29/2021   TRIG 115 11/29/2021   CHOLHDL 3.1 11/29/2021   She needs to be on Xarelto for PAF    Examination:   BP 122/78   Pulse 81   Ht 5\' 2"  (1.575 m)   Wt 157 lb (71.2 kg)   SpO2 96%   BMI 28.72 kg/m   Thyroid: She has a 1-2 cm firm nodule felt in the midline on swallowing only Lateral lobes are not clearly palpable, has some fullness on both sides especially left No other nodules felt  Biceps reflexes show normal relaxation    Assessment    MULTINODULAR goiter: Long-standing  She has a small persistent nodular goiter and this appears less prominent compared to last year now  This is likely a remnant of the previous multinodular goiter which was treated with I-131 No further evaluation needed  Hypothyroidism, post ablative secondary to previous I-131 treatment several years ago She has been on a stable dose of 75 mcg SYNTHROID brand name since about 2016 She feels fairly well  TSH normal as before     Treatment:  Continue 75 g brand-name Synthroid using the 50 mcg prescription and 1-1/2 tablets daily Follow-up in 1 year again   Reather Littler 06/13/2022, 3:55 PM

## 2022-06-27 ENCOUNTER — Other Ambulatory Visit: Payer: Self-pay | Admitting: Endocrinology

## 2022-07-14 ENCOUNTER — Ambulatory Visit
Admission: RE | Admit: 2022-07-14 | Discharge: 2022-07-14 | Disposition: A | Discharge status: Home / Self Care | Payer: Medicare Other | Source: Ambulatory Visit | Attending: Family Medicine | Admitting: Family Medicine

## 2022-07-14 DIAGNOSIS — Z1231 Encounter for screening mammogram for malignant neoplasm of breast: Secondary | ICD-10-CM

## 2022-07-18 ENCOUNTER — Other Ambulatory Visit: Payer: Self-pay | Admitting: Family Medicine

## 2022-07-18 DIAGNOSIS — R928 Other abnormal and inconclusive findings on diagnostic imaging of breast: Secondary | ICD-10-CM

## 2022-07-22 ENCOUNTER — Ambulatory Visit
Admission: RE | Admit: 2022-07-22 | Discharge: 2022-07-22 | Disposition: A | Discharge status: Home / Self Care | Payer: Medicare Other | Source: Ambulatory Visit | Attending: Family Medicine | Admitting: Family Medicine

## 2022-07-22 DIAGNOSIS — N6012 Diffuse cystic mastopathy of left breast: Secondary | ICD-10-CM | POA: Diagnosis not present

## 2022-07-22 DIAGNOSIS — R928 Other abnormal and inconclusive findings on diagnostic imaging of breast: Secondary | ICD-10-CM

## 2022-07-27 ENCOUNTER — Encounter: Payer: Self-pay | Admitting: Obstetrics and Gynecology

## 2022-07-27 ENCOUNTER — Ambulatory Visit (INDEPENDENT_AMBULATORY_CARE_PROVIDER_SITE_OTHER): Payer: Medicare Other | Admitting: Obstetrics and Gynecology

## 2022-07-27 VITALS — BP 144/84 | HR 86 | Ht 62.0 in | Wt 162.1 lb

## 2022-07-27 DIAGNOSIS — R109 Unspecified abdominal pain: Secondary | ICD-10-CM

## 2022-07-27 DIAGNOSIS — D219 Benign neoplasm of connective and other soft tissue, unspecified: Secondary | ICD-10-CM

## 2022-07-27 NOTE — Patient Instructions (Addendum)
We will check your ultrasound to confirm that the uterus and ovaries are normal. You will see your result and my recommendations on the MyChart app.

## 2022-07-27 NOTE — Progress Notes (Signed)
RETURN GYNECOLOGY VISIT  Subjective:  Dominique Barber is a 67 y.o. menopausal F with hx IBS presenting for fibroids  Was following for Dr Donavan Foil 12/2020-01/2021 for abdominal bloating. Advised at that time that fibroids were not the etiology and recommended GI follow up.   Reports abdominal pain getting worse than baseline (hx IBS) and abdominal swelling/bloating.   I personally reviewed the following: - Pelvic US 02/14/20 7.4 x 2.4 x 3.8cm uterus with small fibroids noted, normal ovaries bilaterally  - CT A/P 01/02/20 small uterine calcification, no adnexal mass, no acute abnormality/findings - Pelvic US 12/03/10 6.8 x 4.1 x 3.7cm uterus w/ 2.4 x 2 x 2.2cm intramural fibroid, normal ovaries bilaterally  Past Medical History:  Diagnosis Date   ACS (acute coronary syndrome) (HCC) 01/06/2020   With progressive angina while troponin elevation--borderline-NON-STEMI   ANTERIOR STEMI of LAD (x2). 01/19/1996   Complicated by VF arrest, and anoxic brain injury with status epilepticus; POBA of LAD --> 6 months later, after 2 vessel CABG she had postop complication with occluded LIMA/second anterior STEMI   CAD in native artery & Grafts 01/1996   a) 1/'98: Ant STEMI => LAD POBA; b) 2/'98: 95% prox LAD @ POBA site --> BMS PCI (3.0 mm x 25 mm); c) 7/98 (5 months later) => BMS ISR of LAD--> CABGx2 (LIMA-LAD, SVG-D1 -> Emergent redo w/ SVG-LAD b/c acute LIMA occlusion); d) 01/2020: ACS -> DES PCI of SVG-LAD (75%&85%), TO of SVG-D1.   COPD (chronic obstructive pulmonary disease) (HCC)    GERD (gastroesophageal reflux disease)    Hiatal hernia    Hyperlipidemia    Hypothyroidism    On Synthroid   Iron deficiency anemia    Irritable bowel syndrome    Followed by Dr. Charna Elizabeth.   Ischemic cardiomyopathy 01/03/2020   ECHO (ACS): EF 45-50% (by Cath EF 40-45%) - Apical Inferior/Apical Anteroseptal & Apex akinetic, GR II DD.    PAF (paroxysmal atrial fibrillation) (HCC) 05/03/2014   New onset -  originally with RVR   PONV (postoperative nausea and vomiting)    very gag reflex   S/P CABG x 2; with EMERGENT Redo x 1. 07/1996   Initially LIMA-LAD, SVG-D1 --> immediate LIMA failure post CABG with anterior MI --> urgent redo SVG-LAD beyond D2.; Widely patent grafts as of 2005 with left dominant system. Graft to the diagonal branch has a very small target vessel but the vein graft to LAD retrograde fills a large bifurcating D2 and antegrade fills a large D3.   Tobacco abuse    Past Surgical History:  Procedure Laterality Date   APPENDECTOMY     BIOPSY  05/08/2020   Procedure: BIOPSY;  Surgeon: Jeani Hawking, MD;  Location: [facility];  Service: Endoscopy;;   BREAST EXCISIONAL BIOPSY Right    CESAREAN SECTION     CHOLECYSTECTOMY     CORONARY ANGIOPLASTY  01/19/1996   Acute anterior STEMI- LAD POBA:  p-m LAD 70-80% narrowing & focal 95% stenosis in distal 3rd --> Treated with Emergent PTCA( POBA)  (Dr. Jonette Eva   CORONARY ANGIOPLASTY WITH STENT PLACEMENT  02/26/1996   3.0x64mm Multi-Link stent to prox/mid LAD (stenosis at recent PTCA site) (Dr. Jonette Eva)   CORONARY ARTERY BYPASS GRAFT  07/31/1996   x2; LIMA to LAD and SVG to 1st diagonal (Dr. Molinda Bailiff)   CORONARY ARTERY BYPASS GRAFT  07/31/1996   x1; SVG to distal LAD (Dr. Molinda Bailiff)   CORONARY STENT INTERVENTION N/A 01/08/2020   Procedure: CORONARY  STENT INTERVENTION;  Surgeon: Marykay Lex, MD;  Location: [facility];; , SVG-LAD severe focal disease with 75% followed by 85% calcified stenosis--DES PCI (Resolute Onyx 3.5 mm x 38 mm - 3.6 mm)   ESOPHAGOGASTRODUODENOSCOPY (EGD) WITH PROPOFOL N/A 05/08/2020   Procedure: ESOPHAGOGASTRODUODENOSCOPY (EGD) WITH PROPOFOL;  Surgeon: Jeani Hawking, MD;  Location: [facility];  Service: Endoscopy;  Laterality: N/A;   LEFT HEART CATH AND CORONARY ANGIOGRAPHY  07/30/1996   normal L main, LAD w/95% stenosis just before stent, diffuse 80-85% end-stent restenosis, 1st diagonal  with70-75% ostial stenosis, large optional diagonal that was normal, Cfx with 2 marginals and distal PLA (all normal), RCA normal (Dr. Jonette Eva)   LEFT HEART CATH AND CORONARY ANGIOGRAPHY  01/19/1996   acute anterior wall MI, anoxic encephalopahty, VF; LCfx free of disease and dominant, RCA patent, L main short and patent, LAD with 70-80% narrowing and focal 95% stenosis in distal 3rd => PTCA  (Dr. Jonette Eva)   LEFT HEART CATH AND CORONARY ANGIOGRAPHY  01/18/2017   acute anterior wall MI, anoxic encephalopahty, VF; LCfx free of disease and dominant, RCA patent, L main short and patent, LAD with 70-80% narrowing and focal 95% stenosis in distal 3rd  (Dr. Jonette Eva)   LEFT HEART CATH AND CORS/GRAFTS ANGIOGRAPHY N/A 01/08/2020   Procedure: LEFT HEART CATH AND CORS/GRAFTS ANGIOGRAPHY;  Surgeon: Marykay Lex, MD;  Location: [facility]; EF 40 to 45%. Mildly elevated LVEDP. Proximal LAD 100% occlusion, mid-distal LAD fills via SVG that has collaterals to D1. Normal dominant native LCx and nondominant RCA. SVG-D1 ostial 100% CTO, SVG-LAD severe focal disease with 75% followed by 85% calcified stenosis--DES PCI   LEFT HEART CATH AND CORS/GRAFTS ANGIOGRAPHY  01/31/2003   LAD totally occluded in prox 3rd, patent Cfx, SVG to mid LAd patent, SVG to small DX1 patent, LIMA to LAD was atretic and essentially occluded in junction of prox & mid 3rd, R barciocephalic and subclavian were normal (Dr. Jonette Eva)   NM [facility] PERF WALL MOTION  10/2010; 06/2014   a) LexiScan Cardiolite - mild perfusion defect r/t infarct/scar with mild periifarct ischemia in mid anterior & apical anterior region; EF 67%; abnormal, low risk study;; b) Small, moderate intensity Fixed perfusion defect - mid Anterior Wall c/w known Anterior MI. LOW RISK    OVARIAN CYST REMOVAL     TRANSTHORACIC ECHOCARDIOGRAM  02/2012; 06/2014   a) EF 50-55%, mild HK of anterospetal myocardium; mild MR;; b) EF 55-60%, mild HK of apical  anterior wall.   Mild MR   TRANSTHORACIC ECHOCARDIOGRAM  01/02/2020   EF 45 to 50%. Apical inferior, apical septal and apex akinetic. GRII DD. Otherwise normal valves, normal RV. Normal atria. Normal pressures.   Current Outpatient Medications on File Prior to Visit  Medication Sig Dispense Refill   acetaminophen (TYLENOL) 500 MG tablet Take 500 mg by mouth every 6 (six) hours as needed for mild pain.     albuterol (VENTOLIN HFA) 108 (90 Base) MCG/ACT inhaler Inhale 1-2 puffs into the lungs every 6 (six) hours as needed for wheezing or shortness of breath.     Alpha-D-Galactosidase (BEANO PO) Take 2 capsules by mouth with breakfast, with lunch, and with evening meal.     alum & mag hydroxide-simeth (MAALOX/MYLANTA) 200-200-20 MG/5ML suspension Take 15-30 mLs by mouth every 6 (six) hours as needed for indigestion or flatulence.     Calcium Carb-Cholecalciferol (CALCIUM+D3 PO) Take 1 tablet by mouth at bedtime.  cetirizine (ZYRTEC) 10 MG tablet TAKE 1 TABLET DAILY (Patient taking differently: Take 10 mg by mouth daily.) 90 tablet 4   Evolocumab (REPATHA SURECLICK) 140 MG/ML SOAJ Inject 140 mg into the skin every 14 (fourteen) days. 6 mL 3   levothyroxine (SYNTHROID) 50 MCG tablet TAKE ONE AND ONE-HALF TABLETS DAILY BEFORE BREAKFAST 135 tablet 1   LINZESS 145 MCG CAPS capsule Take 145 mcg by mouth daily.     metoprolol succinate (TOPROL-XL) 25 MG 24 hr tablet TAKE 1 TABLET TWICE A DAY (DISCONTINUE 50 MG) 180 tablet 3   nitroGLYCERIN (NITROSTAT) 0.4 MG SL tablet Place 1 tablet (0.4 mg total) under the tongue every 5 (five) minutes x 3 doses as needed for chest pain. 25 tablet 6   polyethylene glycol powder (GLYCOLAX/MIRALAX) 17 GM/SCOOP powder Take 1 Container by mouth once.     potassium chloride (KLOR-CON) 10 MEQ tablet TAKE AS INSTRUCTED BY YOUR PRESCRIBER 90 tablet 0   Probiotic Product (ALIGN) 4 MG CAPS Take 4 mg by mouth in the morning.     rivaroxaban (XARELTO) 20 MG TABS tablet TAKE 1  TABLET DAILY WITH SUPPER 90 tablet 1   Simethicone 125 MG TABS Take 125 mg by mouth in the morning, at noon, in the evening, and at bedtime.     No current facility-administered medications on file prior to visit.   Objective:   Vitals:   07/27/22 1548  BP: (!) 144/84  Pulse: 86  Weight: 162 lb 1.6 oz (73.5 kg)  Height: 5\' 2"  (1.575 m)   General:  Alert, oriented and cooperative. Patient is in no acute distress.  Skin: Skin is warm and dry. No rash noted.   Cardiovascular: Normal heart rate noted  Respiratory: Normal respiratory effort, no problems with respiration noted  Abdomen: Soft, non-tender, moderately distended, no palpable mass  Pelvic: NEFG. Cervix normal texture, uterus & adnexa nonpalpable  Exam performed in the presence of a chaperone  Assessment and Plan:  Dominique Barber is a 67 y.o. with fibroids  Fibroids 2. Abdominal pain - stable on imaging for 10 years (2012-2022). Unlikely to be source of symptoms - exam unremarkable - will obtain pelvic US to confirm stability. If stable, no further gyn imaging indicated.  - recommend GI follow up esp if [place] normal  Return for pelvic ultrasound.  Lennart Pall, MD

## 2022-07-27 NOTE — Progress Notes (Signed)
New GYN is in the office related to abdominal pain Pt states that she was advised a few years ago that she had small fibroids and her abdomen has been swelling Last mammogram 07/22/22 Last pap 03/20/2019

## 2022-08-02 ENCOUNTER — Other Ambulatory Visit: Payer: Self-pay | Admitting: Obstetrics and Gynecology

## 2022-08-02 ENCOUNTER — Ambulatory Visit (HOSPITAL_BASED_OUTPATIENT_CLINIC_OR_DEPARTMENT_OTHER)
Admission: RE | Admit: 2022-08-02 | Discharge: 2022-08-02 | Disposition: A | Discharge status: Home / Self Care | Payer: Medicare Other | Source: Ambulatory Visit | Attending: Obstetrics and Gynecology | Admitting: Obstetrics and Gynecology

## 2022-08-02 DIAGNOSIS — D219 Benign neoplasm of connective and other soft tissue, unspecified: Secondary | ICD-10-CM | POA: Insufficient documentation

## 2022-08-02 DIAGNOSIS — R109 Unspecified abdominal pain: Secondary | ICD-10-CM | POA: Diagnosis not present

## 2022-08-02 DIAGNOSIS — R140 Abdominal distension (gaseous): Secondary | ICD-10-CM | POA: Diagnosis not present

## 2022-08-02 DIAGNOSIS — R102 Pelvic and perineal pain: Secondary | ICD-10-CM | POA: Diagnosis not present

## 2022-08-02 DIAGNOSIS — D259 Leiomyoma of uterus, unspecified: Secondary | ICD-10-CM | POA: Diagnosis not present

## 2022-08-16 ENCOUNTER — Ambulatory Visit: Payer: Self-pay

## 2022-08-16 NOTE — Telephone Encounter (Signed)
     Chief Complaint: Tingling, numbness to both hands. More constant now. Symptoms: Above Frequency: Yesterday Pertinent Negatives: Patient denies any injury, no chest pain, no dizziness. Disposition: [] ED /[] Urgent Care (no appt availability in office) / [] Appointment(In office/virtual)/ []  [facility]/ [] Home Care/ [] Refused Recommended Disposition /[]  Mobile Bus/ [x]  Follow-up with PCP Additional Notes: Warm transfer to "Q" in the practice for an appointment. Reason for Disposition  [1] Numbness or tingling on both sides of body AND [2] is a new symptom present > 24 hours  Answer Assessment - Initial Assessment Questions 1. SYMPTOM: "What is the main symptom you are concerned about?" (e.g., weakness, numbness)     Hands tingling, numbness 2. ONSET: "When did this start?" (minutes, hours, days; while sleeping)     Yesterday 3. LAST NORMAL: "When was the last time you (the patient) were normal (no symptoms)?"     Last week 4. PATTERN "Does this come and go, or has it been constant since it started?"  "Is it present now?"     Constant 5. CARDIAC SYMPTOMS: "Have you had any of the following symptoms: chest pain, difficulty breathing, palpitations?"     No 6. NEUROLOGIC SYMPTOMS: "Have you had any of the following symptoms: headache, dizziness, vision loss, double vision, changes in speech, unsteady on your feet?"     SOB with exertion   pulse - 60 7. OTHER SYMPTOMS: "Do you have any other symptoms?"     Above 8. PREGNANCY: "Is there any chance you are pregnant?" "When was your last menstrual period?"     No  Protocols used: Neurologic Deficit-A-AH

## 2022-08-17 ENCOUNTER — Ambulatory Visit: Payer: Medicare Other | Admitting: Family Medicine

## 2022-08-17 VITALS — BP 117/76 | HR 74 | Temp 98.1°F | Resp 16 | Wt 161.2 lb

## 2022-08-17 DIAGNOSIS — R200 Anesthesia of skin: Secondary | ICD-10-CM | POA: Diagnosis not present

## 2022-08-17 DIAGNOSIS — R202 Paresthesia of skin: Secondary | ICD-10-CM | POA: Diagnosis not present

## 2022-08-17 DIAGNOSIS — L989 Disorder of the skin and subcutaneous tissue, unspecified: Secondary | ICD-10-CM | POA: Diagnosis not present

## 2022-08-19 ENCOUNTER — Other Ambulatory Visit: Payer: Self-pay | Admitting: Cardiology

## 2022-08-19 ENCOUNTER — Encounter: Payer: Self-pay | Admitting: Family Medicine

## 2022-08-19 MED ORDER — POTASSIUM CHLORIDE ER 10 MEQ PO TBCR: 10.0000 meq | EXTENDED_RELEASE_TABLET | Freq: Every day | ORAL | 1 refills | Status: DC

## 2022-08-19 NOTE — Progress Notes (Signed)
Established Patient Office Visit  Subjective    Patient ID: Dominique Barber, female    DOB: April 24, 1955  Age: 67 y.o. MRN: [id number]  CC:  Chief Complaint  Patient presents with   Numbness    HPI Dominique Barber presents with complaint of lesion on back and intermittent numbness and tingling in hands.    Outpatient Encounter Medications as of 08/17/2022  Medication Sig   acetaminophen (TYLENOL) 500 MG tablet Take 500 mg by mouth every 6 (six) hours as needed for mild pain.   albuterol (VENTOLIN HFA) 108 (90 Base) MCG/ACT inhaler Inhale 1-2 puffs into the lungs every 6 (six) hours as needed for wheezing or shortness of breath.   Alpha-D-Galactosidase (BEANO PO) Take 2 capsules by mouth with breakfast, with lunch, and with evening meal.   alum & mag hydroxide-simeth (MAALOX/MYLANTA) 200-200-20 MG/5ML suspension Take 15-30 mLs by mouth every 6 (six) hours as needed for indigestion or flatulence.   Calcium Carb-Cholecalciferol (CALCIUM+D3 PO) Take 1 tablet by mouth at bedtime.   cetirizine (ZYRTEC) 10 MG tablet TAKE 1 TABLET DAILY (Patient taking differently: Take 10 mg by mouth daily.)   Evolocumab (REPATHA SURECLICK) 140 MG/ML SOAJ Inject 140 mg into the skin every 14 (fourteen) days.   levothyroxine (SYNTHROID) 50 MCG tablet TAKE ONE AND ONE-HALF TABLETS DAILY BEFORE BREAKFAST   LINZESS 145 MCG CAPS capsule Take 145 mcg by mouth daily.   metoprolol succinate (TOPROL-XL) 25 MG 24 hr tablet TAKE 1 TABLET TWICE A DAY (DISCONTINUE 50 MG)   nitroGLYCERIN (NITROSTAT) 0.4 MG SL tablet Place 1 tablet (0.4 mg total) under the tongue every 5 (five) minutes x 3 doses as needed for chest pain.   polyethylene glycol powder (GLYCOLAX/MIRALAX) 17 GM/SCOOP powder Take 1 Container by mouth once.   potassium chloride (KLOR-CON) 10 MEQ tablet TAKE AS INSTRUCTED BY YOUR PRESCRIBER   Probiotic Product (ALIGN) 4 MG CAPS Take 4 mg by mouth in the morning.   rivaroxaban (XARELTO) 20 MG TABS tablet TAKE  1 TABLET DAILY WITH SUPPER   Simethicone 125 MG TABS Take 125 mg by mouth in the morning, at noon, in the evening, and at bedtime.   No facility-administered encounter medications on file as of 08/17/2022.    Past Medical History:  Diagnosis Date   ACS (acute coronary syndrome) (HCC) 01/06/2020   With progressive angina while troponin elevation--borderline-NON-STEMI   ANTERIOR STEMI of LAD (x2). 01/19/1996   Complicated by VF arrest, and anoxic brain injury with status epilepticus; POBA of LAD --> 6 months later, after 2 vessel CABG she had postop complication with occluded LIMA/second anterior STEMI   CAD in native artery & Grafts 01/1996   a) 1/'98: Ant STEMI => LAD POBA; b) 2/'98: 95% prox LAD @ POBA site --> BMS PCI (3.0 mm x 25 mm); c) 7/98 (5 months later) => BMS ISR of LAD--> CABGx2 (LIMA-LAD, SVG-D1 -> Emergent redo w/ SVG-LAD b/c acute LIMA occlusion); d) 01/2020: ACS -> DES PCI of SVG-LAD (75%&85%), TO of SVG-D1.   COPD (chronic obstructive pulmonary disease) (HCC)    GERD (gastroesophageal reflux disease)    Hiatal hernia    Hyperlipidemia    Hypothyroidism    On Synthroid   Iron deficiency anemia    Irritable bowel syndrome    Followed by Dr. Charna Elizabeth.   Ischemic cardiomyopathy 01/03/2020   ECHO (ACS): EF 45-50% (by Cath EF 40-45%) - Apical Inferior/Apical Anteroseptal & Apex akinetic, GR II DD.    PAF (paroxysmal  atrial fibrillation) (HCC) 05/03/2014   New onset - originally with RVR   PONV (postoperative nausea and vomiting)    very gag reflex   S/P CABG x 2; with EMERGENT Redo x 1. 07/1996   Initially LIMA-LAD, SVG-D1 --> immediate LIMA failure post CABG with anterior MI --> urgent redo SVG-LAD beyond D2.; Widely patent grafts as of 2005 with left dominant system. Graft to the diagonal branch has a very small target vessel but the vein graft to LAD retrograde fills a large bifurcating D2 and antegrade fills a large D3.   Tobacco abuse     Past Surgical History:   Procedure Laterality Date   APPENDECTOMY     BIOPSY  05/08/2020   Procedure: BIOPSY;  Surgeon: Jeani Hawking, MD;  Location: [facility];  Service: Endoscopy;;   BREAST EXCISIONAL BIOPSY Right    CESAREAN SECTION     CHOLECYSTECTOMY     CORONARY ANGIOPLASTY  01/19/1996   Acute anterior STEMI- LAD POBA:  p-m LAD 70-80% narrowing & focal 95% stenosis in distal 3rd --> Treated with Emergent PTCA( POBA)  (Dr. Jonette Eva   CORONARY ANGIOPLASTY WITH STENT PLACEMENT  02/26/1996   3.0x54mm Multi-Link stent to prox/mid LAD (stenosis at recent PTCA site) (Dr. Jonette Eva)   CORONARY ARTERY BYPASS GRAFT  07/31/1996   x2; LIMA to LAD and SVG to 1st diagonal (Dr. Molinda Bailiff)   CORONARY ARTERY BYPASS GRAFT  07/31/1996   x1; SVG to distal LAD (Dr. Molinda Bailiff)   CORONARY STENT INTERVENTION N/A 01/08/2020   Procedure: CORONARY STENT INTERVENTION;  Surgeon: Marykay Lex, MD;  Location: [facility];; , SVG-LAD severe focal disease with 75% followed by 85% calcified stenosis--DES PCI (Resolute Onyx 3.5 mm x 38 mm - 3.6 mm)   ESOPHAGOGASTRODUODENOSCOPY (EGD) WITH PROPOFOL N/A 05/08/2020   Procedure: ESOPHAGOGASTRODUODENOSCOPY (EGD) WITH PROPOFOL;  Surgeon: Jeani Hawking, MD;  Location: [facility];  Service: Endoscopy;  Laterality: N/A;   LEFT HEART CATH AND CORONARY ANGIOGRAPHY  07/30/1996   normal L main, LAD w/95% stenosis just before stent, diffuse 80-85% end-stent restenosis, 1st diagonal with70-75% ostial stenosis, large optional diagonal that was normal, Cfx with 2 marginals and distal PLA (all normal), RCA normal (Dr. Jonette Eva)   LEFT HEART CATH AND CORONARY ANGIOGRAPHY  01/19/1996   acute anterior wall MI, anoxic encephalopahty, VF; LCfx free of disease and dominant, RCA patent, L main short and patent, LAD with 70-80% narrowing and focal 95% stenosis in distal 3rd => PTCA  (Dr. Jonette Eva)   LEFT HEART CATH AND CORONARY ANGIOGRAPHY  01/18/2017   acute anterior wall MI, anoxic  encephalopahty, VF; LCfx free of disease and dominant, RCA patent, L main short and patent, LAD with 70-80% narrowing and focal 95% stenosis in distal 3rd  (Dr. Jonette Eva)   LEFT HEART CATH AND CORS/GRAFTS ANGIOGRAPHY N/A 01/08/2020   Procedure: LEFT HEART CATH AND CORS/GRAFTS ANGIOGRAPHY;  Surgeon: Marykay Lex, MD;  Location: [facility]; EF 40 to 45%. Mildly elevated LVEDP. Proximal LAD 100% occlusion, mid-distal LAD fills via SVG that has collaterals to D1. Normal dominant native LCx and nondominant RCA. SVG-D1 ostial 100% CTO, SVG-LAD severe focal disease with 75% followed by 85% calcified stenosis--DES PCI   LEFT HEART CATH AND CORS/GRAFTS ANGIOGRAPHY  01/31/2003   LAD totally occluded in prox 3rd, patent Cfx, SVG to mid LAd patent, SVG to small DX1 patent, LIMA to LAD was atretic and essentially occluded in junction of prox & mid  3rd, R barciocephalic and subclavian were normal (Dr. Jonette Eva)   NM [facility] PERF WALL MOTION  10/2010; 06/2014   a) LexiScan Cardiolite - mild perfusion defect r/t infarct/scar with mild periifarct ischemia in mid anterior & apical anterior region; EF 67%; abnormal, low risk study;; b) Small, moderate intensity Fixed perfusion defect - mid Anterior Wall c/w known Anterior MI. LOW RISK    OVARIAN CYST REMOVAL     TRANSTHORACIC ECHOCARDIOGRAM  02/2012; 06/2014   a) EF 50-55%, mild HK of anterospetal myocardium; mild MR;; b) EF 55-60%, mild HK of apical anterior wall.   Mild MR   TRANSTHORACIC ECHOCARDIOGRAM  01/02/2020   EF 45 to 50%. Apical inferior, apical septal and apex akinetic. GRII DD. Otherwise normal valves, normal RV. Normal atria. Normal pressures.    Family History  Problem Relation Age of Onset   Heart failure Other    Diabetes Other    Diabetes Mother    Breast cancer Mother    Thyroid disease Paternal Aunt    Thyroid disease Maternal Grandmother     Social History   Socioeconomic History   Marital status: Married    Spouse name:  Not on file   Number of children: 3   Years of education: Not on file   Highest education level: Not on file  Occupational History    Employer: DISABLED  Tobacco Use   Smoking status: Former    Current packs/day: 0.00    Types: Cigarettes    Quit date: 01/04/2020    Years since quitting: 2.6   Smokeless tobacco: Former  Building services engineer status: Never Used  Substance and Sexual Activity   Alcohol use: No    Alcohol/week: 0.0 standard drinks of alcohol   Drug use: Never   Sexual activity: Not on file  Other Topics Concern   Not on file  Social History Narrative   Married, mother of 3 children (2 living sons age 43-26 and 30-31; her daughter was the victim of a murder that took place sometime around the time of the patient's MI. She was under significant amount of stress as her daughter had gone missing. She was never found. Finally the suspect was charged and convicted in 2009. She had sessile he stop smoking until that time frame when it brought back memories and she is now back to smoking a half pack a day.She does not get routine exercise, but is active around the house and doing housecleaning chores.She does not drink alcohol.         Family history: mgm died at 71, several maternal aunts and uncles all died in their 46's as well; mother didn't have any heart issues, had dementia, DM, died in her 56's; father lung cancer - smoker died at 6; 1 brother w/o heart issues; 2 living children - one with seizure disorder, no cholesterol issues known       Diet: doesn't eat much; has problems with GI/bloating since bypass; maybe 1-1.5 meals per day; oatmeal in the monings; sandwiches/soups in small amounts in the day; IBS issues more constipation; linzess, simethicone, maalox       Exercise:  housework   Social Determinants of Health   Financial Resource Strain: Low Risk  (05/19/2022)   Overall Financial Resource Strain (CARDIA)    Difficulty of Paying Living Expenses: Not hard at all   Food Insecurity: No Food Insecurity (05/19/2022)   Hunger Vital Sign    Worried About Running Out of Food in the  Last Year: Never true    Ran Out of Food in the Last Year: Never true  Transportation Needs: No Transportation Needs (05/19/2022)   PRAPARE - Administrator, Civil Service (Medical): No    Lack of Transportation (Non-Medical): No  Physical Activity: Inactive (05/19/2022)   Exercise Vital Sign    Days of Exercise per Week: 0 days    Minutes of Exercise per Session: 0 min  Stress: No Stress Concern Present (05/19/2022)   [employer] of Occupational Health - Occupational Stress Questionnaire    Feeling of Stress : Not at all  Social Connections: Not on file  Intimate Partner Violence: Not on file    Review of Systems  Neurological:  Positive for tingling.  All other systems reviewed and are negative.       Objective    BP 117/76   Pulse 74   Temp 98.1 F (36.7 C) (Oral)   Resp 16   Wt 161 lb 3.2 oz (73.1 kg)   SpO2 96%   BMI 29.48 kg/m   Physical Exam Vitals and nursing note reviewed.  Constitutional:      General: She is not in acute distress. Cardiovascular:     Rate and Rhythm: Normal rate and regular rhythm.  Pulmonary:     Effort: Pulmonary effort is normal.     Breath sounds: Normal breath sounds.  Abdominal:     Palpations: Abdomen is soft.     Tenderness: There is no abdominal tenderness.  Skin:    Findings: Lesion (back) present.  Neurological:     General: No focal deficit present.     Mental Status: She is alert and oriented to person, place, and time.         Assessment & Plan:   1. Skin lesion of back Referral to derm for further eval/mgt - Ambulatory referral to Dermatology  2. Numbness and tingling in both hands Patient defers further eval/mgt at this time    No follow-ups on file.   Tommie Raymond, MD

## 2022-08-22 ENCOUNTER — Other Ambulatory Visit: Payer: Self-pay | Admitting: Cardiology

## 2022-08-22 NOTE — Telephone Encounter (Signed)
Prescription refill request for Xarelto received.  Indication:AFIB Last office visit:11/23 Weight:73.1  kg Age:67 Scr:1.21  1/24 CrCl:52.06  ml/min  Prescription refilled

## 2022-09-02 ENCOUNTER — Ambulatory Visit: Payer: Medicare Other | Admitting: Family

## 2022-09-08 DIAGNOSIS — R140 Abdominal distension (gaseous): Secondary | ICD-10-CM | POA: Diagnosis not present

## 2022-09-08 DIAGNOSIS — K219 Gastro-esophageal reflux disease without esophagitis: Secondary | ICD-10-CM | POA: Diagnosis not present

## 2022-09-08 DIAGNOSIS — K581 Irritable bowel syndrome with constipation: Secondary | ICD-10-CM | POA: Diagnosis not present

## 2022-09-08 DIAGNOSIS — K5730 Diverticulosis of large intestine without perforation or abscess without bleeding: Secondary | ICD-10-CM | POA: Diagnosis not present

## 2022-09-08 DIAGNOSIS — E669 Obesity, unspecified: Secondary | ICD-10-CM | POA: Diagnosis not present

## 2022-10-28 ENCOUNTER — Ambulatory Visit: Payer: Medicare Other | Attending: Cardiology | Admitting: Cardiology

## 2022-10-28 ENCOUNTER — Encounter: Payer: Self-pay | Admitting: Cardiology

## 2022-10-28 VITALS — BP 138/78 | HR 75 | Ht 62.0 in | Wt 163.0 lb

## 2022-10-28 DIAGNOSIS — Z951 Presence of aortocoronary bypass graft: Secondary | ICD-10-CM | POA: Insufficient documentation

## 2022-10-28 DIAGNOSIS — I2102 ST elevation (STEMI) myocardial infarction involving left anterior descending coronary artery: Secondary | ICD-10-CM | POA: Diagnosis not present

## 2022-10-28 DIAGNOSIS — D6869 Other thrombophilia: Secondary | ICD-10-CM | POA: Insufficient documentation

## 2022-10-28 DIAGNOSIS — I480 Paroxysmal atrial fibrillation: Secondary | ICD-10-CM | POA: Diagnosis not present

## 2022-10-28 DIAGNOSIS — R7989 Other specified abnormal findings of blood chemistry: Secondary | ICD-10-CM | POA: Diagnosis not present

## 2022-10-28 DIAGNOSIS — I25119 Atherosclerotic heart disease of native coronary artery with unspecified angina pectoris: Secondary | ICD-10-CM | POA: Insufficient documentation

## 2022-10-28 DIAGNOSIS — Z720 Tobacco use: Secondary | ICD-10-CM | POA: Insufficient documentation

## 2022-10-28 DIAGNOSIS — I25710 Atherosclerosis of autologous vein coronary artery bypass graft(s) with unstable angina pectoris: Secondary | ICD-10-CM | POA: Diagnosis not present

## 2022-10-28 DIAGNOSIS — E785 Hyperlipidemia, unspecified: Secondary | ICD-10-CM | POA: Diagnosis not present

## 2022-10-28 DIAGNOSIS — R140 Abdominal distension (gaseous): Secondary | ICD-10-CM | POA: Insufficient documentation

## 2022-10-28 DIAGNOSIS — I255 Ischemic cardiomyopathy: Secondary | ICD-10-CM | POA: Insufficient documentation

## 2022-10-28 MED ORDER — REPATHA SURECLICK 140 MG/ML ~~LOC~~ SOAJ: 140.0000 mg | SUBCUTANEOUS | 3 refills | Status: DC

## 2022-10-28 MED ORDER — NITROGLYCERIN 0.4 MG SL SUBL: 0.4000 mg | SUBLINGUAL_TABLET | SUBLINGUAL | 4 refills | Status: DC | PRN

## 2022-10-28 NOTE — Progress Notes (Unsigned)
Cardiology Office Note:  .   Date:  11/01/2022  ID:  Dominique Barber, DOB [birth year], MRN [id number] PCP: Dominique Skeans, MD  [facility] Providers Cardiologist:  Dominique Lemma, MD     Chief Complaint  Patient presents with   Follow-up    Annual   Coronary Artery Disease    No angina   Atrial Fibrillation    Only short bursts of irregular heartbeats    Patient Profile: .     Dominique Barber is a 67 y.o. female with a PMH reviewed below who presents here for annual follow-up at the request of Dominique Skeans, MD.  Cardiac History: CAD: STEMI 1998 w/ VR Arrest & ~ anoxic brain injury -> status epilepticus -> POBA-LAD --> 1 month later returned with [place] --> 95% re-stenosis --> BMS-LAD (Dr. Alanda Barber) 7/'98 Recurrent Angina -> severe ISR --> CABG x 2 (LIMA-LAD, SVG-D1) => emergent re-operation post-OP --> LIMA occlusion --> SVG-LAD. Jan 2005: Cath -> patent grafts & normal Dom LCx, non-dome RCA. Small D1). Jan 02, 2020: Admitted with Afib-RVR & Demand Ischemia (+ Trop & lat TWI) - planned OP Myoview Jan 04 2020 - ER with CP (left after 12 hr & not seen) --> EMS came out on Jan 2, EKG no longer had TWI.  Jan 06, 2020 -> returned to ER with recurrent ACS Sx --> Cath Jan 08, 2020 Cath-PCI SVG-LAD, SVG-Diag CTO. Stable native Dom LCx & non-dome RCA PAF -Most recent episode Dec 2021 -> converted with IV Diltiazem; on Xarelto CRFs: HTN, HLD;  COPD, GERD,  => statin intolerant- on Repatha ** IBS has been flaring up since Dec 2021 (mostly notes epigastric pain, bloating, early satiety & nausea   Dominique Barber was last seen on 11/15/2021 overall in better spirits.  Feeling well.  Still mostly troubled by her abdominal issues that all stemmed from her postop CABG time.  She said she had had 1 episode where she thought that maybe she had a V-fib lasting maybe 5 to 10 minutes.  Just irregular heartbeat but not fast.  Triggered by GI upset.  Otherwise no active cardiac symptoms.-No  med changes made.  Plan was to follow-up with an Dominique Barber in 6 months.  Subjective  Discussed the use of Dominique Barber scribe software for clinical note transcription with the patient, who gave verbal consent to proceed.  History of Present Illness   The patient, with a history of cardiovascular disease and gastrointestinal issues, last seen mid-year, reports persistent stomach troubles. She has not yet seen her new endocrinologist, Dominique Barber, who replaced Dominique Barber. The patient's medication regimen was reviewed, with no changes reported since the last visit. The patient is due for a stress test to monitor the status of her grafts but expresses concern about lying flat due to exacerbation of stomach discomfort.  The patient reports infrequent episodes of what she describes as atrial fibrillation (described as irregular heart beats, lasting only a few minutes). She has not had her cholesterol checked since the previous year. The patient is currently on Repatha, and her cholesterol levels need to be monitored to assess the effectiveness of this medication.  The patient's blood pressure was noted to be borderline at 138/78. She reports no bleeding issues and is on Xarelto without aspirin. She sleeps on two pillows, not due to breathing issues, but to alleviate stomach discomfort. She denies any recurrence of the chest discomfort that led to previous stent placement.  The patient also reports tingling in the  hands, which she attributes to carpal tunnel syndrome, and cramps in the feet. She denies any symptoms suggestive of colon cancer, such as narrow stools or blood in the stool. The patient is due to see her primary care provider in November, at which time cholesterol, A1C, liver function, and kidney function tests are recommended.     Cardiovascular ROS: no chest pain or dyspnea on exertion positive for - irregular heartbeat and palpitations negative for - edema, orthopnea, paroxysmal nocturnal dyspnea, rapid heart  rate, shortness of breath, or syncope or near syncope, TIA or amaurosis fugax, claudication  ROS:  Review of Systems - Negative except GI symptoms noted above.    Objective  Studies Reviewed: Marland Kitchen   EKG Interpretation Date/Time:  Friday October 28 2022 16:12:09 EDT Ventricular Rate:  75 PR Interval:  140 QRS Duration:  68 QT Interval:  358 QTC Calculation: 399 R Axis:   76  Text Interpretation: Normal sinus rhythm Low voltage QRS Cannot rule out Anterior infarct (cited on or before 08-Jan-2020) When compared with ECG of 22-Jan-2022 14:57, T wave inversion no longer evident in Inferior leads T wave inversion less evident in Lateral leads Confirmed by Dominique Barber ([id number]) on 11/01/2022 7:30:58 PM   Labs from 11/29/2021: TC 188, TG 115, HDL 61, LDL 107.  This is despite being on Repatha. 01/22/2022: Hgb 11.5, Cr 1.2, K+ 3.7 TTE 01/02/2020: EF 45 to 50%.  Mild dysfunction.  Apical inferior and apex are akinetic.  GR 2 DD.  Normal RV.  Normal mitral and aortic valves.  Normal RAP.  LHC-CORS-GRAFTS-PCI (01/08/2020): EF 40 to 45%. Mildly elevated LVEDP. Proximal LAD 100% occlusion, mid-distal LAD fills via SVG that has collaterals to D1. Normal dominant native LCx and nondominant RCA. SVG-D1 ostial 100% CTO, SVG-LAD severe focal disease with 75% followed by 85% calcified stenosis--DES PCI (Resolute Onyx 3.5 mm x 38 mm - 3.6 mm) Dx                                                                                                                              PCI    Risk Assessment/Calculations:    CHA2DS2-VASc Score = 4   This indicates a 4.8% annual risk of stroke. The patient's score is based upon: CHF History: 0 HTN History: 1 Diabetes History: 0 Stroke History: 0 Vascular Disease History: 1 Age Score: 1 Gender Score: 1   On DOAC      Physical Exam:   VS:  BP 138/78   Pulse 75   Ht 5\' 2"  (1.575 m)   Wt 163 lb (73.9 kg)   SpO2 97%   BMI 29.81 kg/m    Wt Readings from Last 3  Encounters:  10/28/22 163 lb (73.9 kg)  08/17/22 161 lb 3.2 oz (73.1 kg)  07/27/22 162 lb 1.6 oz (73.5 kg)    GEN: Well nourished, well developed in no acute distress; well-groomed.  In good spirits. NECK: No JVD; No carotid  bruits CARDIAC: Normal S1, S2; RRR, no murmurs, rubs, gallops RESPIRATORY:  Clear to auscultation without rales, wheezing or rhonchi ; nonlabored, good air movement. ABDOMEN: Bloated, somewhat distended abdomen.  Somewhat firm and mildly distended.  Bowel sounds are increased. EXTREMITIES:  No edema; No deformity      ASSESSMENT AND PLAN: .    Problem List Items Addressed This Visit       Cardiology Problems   Coronary artery disease involving autologous vein coronary bypass graft with unstable angina pectoris (HCC) (Chronic)    Status post PCI to SVG-LAD.  Not on antiplatelet agent because of DOAC.  Striving to improve lipids with Repatha. On beta-blocker.      Relevant Medications   nitroGLYCERIN (NITROSTAT) 0.4 MG SL tablet   Evolocumab (REPATHA SURECLICK) 140 MG/ML SOAJ   Other Relevant Orders   EKG 12-Lead (Completed)   Hemoglobin A1c   Hepatic function panel   Lipid panel   History of ST elevation (STEMI) myocardial infarction involving left anterior descending coronary artery (HCC) (Chronic)    MI at the time was complicated by VF arrest and anoxic brain injury with status epilepticus for which she underwent probe of the LAD and subsequently underwent CABG.  Essentially resolved now.  Mildly reduced EF only.      Relevant Medications   nitroGLYCERIN (NITROSTAT) 0.4 MG SL tablet   Evolocumab (REPATHA SURECLICK) 140 MG/ML SOAJ   Other Relevant Orders   EKG 12-Lead (Completed)   Hemoglobin A1c   Hepatic function panel   Lipid panel   Hypercoagulable state due to paroxysmal atrial fibrillation (HCC) (Chronic)    CHA2DS2-VASc score 4 On Xarelto no major bleeding.  Okay to hold Xarelto 2 days preop for procedures or surgeries.      Relevant  Medications   nitroGLYCERIN (NITROSTAT) 0.4 MG SL tablet   Evolocumab (REPATHA SURECLICK) 140 MG/ML SOAJ   Hyperlipidemia with target LDL less than 70 (Chronic)    On Repatha, cholesterol levels not checked recently, but not adequately controlled last year in November.. -Check lipid panel at next PCP visit in November. -Also recommend checking A1c along with lipid and chemistries.      Relevant Medications   nitroGLYCERIN (NITROSTAT) 0.4 MG SL tablet   Evolocumab (REPATHA SURECLICK) 140 MG/ML SOAJ   Ischemic cardiomyopathy (Chronic)   Relevant Medications   nitroGLYCERIN (NITROSTAT) 0.4 MG SL tablet   Evolocumab (REPATHA SURECLICK) 140 MG/ML SOAJ   Other Relevant Orders   EKG 12-Lead (Completed)   Hemoglobin A1c   Hepatic function panel   Lipid panel   Native CAD- 100% LAD, s/p SVG-LAD after failed LIMA-LAD (Chronic)    Stable since vein graft intervention. No new chest pain or symptoms suggestive of angina. Plan for stress test in the near future to assess graft patency. -Continue current medications: Toprol 25 mg daily, Repatha Not on aspirin or Plavix because of DOAC.      Relevant Medications   nitroGLYCERIN (NITROSTAT) 0.4 MG SL tablet   Evolocumab (REPATHA SURECLICK) 140 MG/ML SOAJ   Other Relevant Orders   EKG 12-Lead (Completed)   Hemoglobin A1c   Hepatic function panel   Lipid panel   Paroxysmal atrial fibrillation (HCC); CHA2DS2-VASc Score 3. On Xarelto (Chronic)    If any she is only having short breakthrough spells of irregular heartbeats.  Nothing sustained for prolonged periods of time.  Remains on modest dose Toprol 25 mg daily and anticoagulation with Xarelto.      Relevant Medications   nitroGLYCERIN (  NITROSTAT) 0.4 MG SL tablet   Evolocumab (REPATHA SURECLICK) 140 MG/ML SOAJ   Other Relevant Orders   EKG 12-Lead (Completed)   Hemoglobin A1c   Hepatic function panel   Lipid panel     Other   Abdominal bloating    Chronic issue, possibly related to  hiatal hernia. Causes discomfort when lying flat. -No changes to current management.      S/P CABG x 2 (Chronic)    Only graft remaining his SVG-LAD.  The SVG-diagonal is now occluded with the diagonal being filled by collaterals.  Original LIMA occluded post CABG. Now status post stent to the LAD graft.  Last cath was in 2022-would need ischemic evaluation by 2026.      Tobacco abuse (Chronic)    Still having difficulty quitting but is down to minimal cigarettes.      Other Visit Diagnoses     Elevated serum creatinine    -  Primary   Relevant Orders   EKG 12-Lead (Completed)   Hemoglobin A1c   Hepatic function panel   Lipid panel           Dispo: Return in about 1 year (around 10/28/2023) for 1 Yr Follow-up. -Renew Repatha prescription. -Follow-up in 1 year.   Total time spent: 22 min spent with patient + 13 min spent charting = 35 min    Signed, Marykay Lex, MD, MS Dominique Barber, M.D., M.S. Interventional Cardiologist  [facility]  Pager # [phone] Phone # [phone] [address] [place], [place] [address]

## 2022-10-28 NOTE — Patient Instructions (Addendum)
Medication Instructions:   No changes  *If you need a refill on your cardiac medications before your next appointment, please call your pharmacy*   Lab Work: fasting Lipid LIVER panel hgbA1c If you have labs (blood work) drawn today and your tests are completely normal, you will receive your results only by: MyChart Message (if you have MyChart) OR A paper copy in the mail If you have any lab test that is abnormal or we need to change your treatment, we will call you to review the results.   Testing/Procedures: Not needed   Follow-Up: At [facility], you and your health needs are our priority.  As part of our continuing mission to provide you with exceptional heart care, we have created designated Provider Care Teams.  These Care Teams include your primary Cardiologist (physician) and Advanced Practice Providers (APPs -  Physician Assistants and Nurse Practitioners) who all work together to provide you with the care you need, when you need it.     Your next appointment:   12 month(s)  The format for your next appointment:   In Person  Provider:   Bryan Lemma, MD

## 2022-11-01 ENCOUNTER — Encounter: Payer: Self-pay | Admitting: Cardiology

## 2022-11-01 NOTE — Assessment & Plan Note (Signed)
Stable since vein graft intervention. No new chest pain or symptoms suggestive of angina. Plan for stress test in the near future to assess graft patency. -Continue current medications: Toprol 25 mg daily, Repatha Not on aspirin or Plavix because of DOAC.

## 2022-11-01 NOTE — Assessment & Plan Note (Addendum)
On Repatha, cholesterol levels not checked recently, but not adequately controlled last year in November.. -Check lipid panel at next PCP visit in November. -Also recommend checking A1c along with lipid and chemistries.

## 2022-11-01 NOTE — Assessment & Plan Note (Signed)
Only graft remaining his SVG-LAD.  The SVG-diagonal is now occluded with the diagonal being filled by collaterals.  Original LIMA occluded post CABG. Now status post stent to the LAD graft.  Last cath was in 2022-would need ischemic evaluation by 2026.

## 2022-11-01 NOTE — Assessment & Plan Note (Signed)
CHA2DS2-VASc score 4 On Xarelto no major bleeding.  Okay to hold Xarelto 2 days preop for procedures or surgeries.

## 2022-11-01 NOTE — Assessment & Plan Note (Signed)
Status post PCI to SVG-LAD.  Not on antiplatelet agent because of DOAC.  Striving to improve lipids with Repatha. On beta-blocker.

## 2022-11-01 NOTE — Assessment & Plan Note (Signed)
Still having difficulty quitting but is down to minimal cigarettes.

## 2022-11-01 NOTE — Assessment & Plan Note (Signed)
If any she is only having short breakthrough spells of irregular heartbeats.  Nothing sustained for prolonged periods of time.  Remains on modest dose Toprol 25 mg daily and anticoagulation with Xarelto.

## 2022-11-01 NOTE — Assessment & Plan Note (Signed)
MI at the time was complicated by VF arrest and anoxic brain injury with status epilepticus for which she underwent probe of the LAD and subsequently underwent CABG.  Essentially resolved now.  Mildly reduced EF only.

## 2022-11-01 NOTE — Assessment & Plan Note (Signed)
Chronic issue, possibly related to hiatal hernia. Causes discomfort when lying flat. -No changes to current management.

## 2022-11-23 ENCOUNTER — Ambulatory Visit: Payer: Medicare Other | Admitting: Family Medicine

## 2022-11-24 ENCOUNTER — Ambulatory Visit (INDEPENDENT_AMBULATORY_CARE_PROVIDER_SITE_OTHER): Payer: Medicare Other | Admitting: Family Medicine

## 2022-11-24 VITALS — BP 123/79 | HR 81 | Temp 97.9°F | Resp 16 | Wt 163.0 lb

## 2022-11-24 DIAGNOSIS — I10 Essential (primary) hypertension: Secondary | ICD-10-CM

## 2022-11-24 DIAGNOSIS — L989 Disorder of the skin and subcutaneous tissue, unspecified: Secondary | ICD-10-CM

## 2022-11-24 NOTE — Progress Notes (Signed)
Established Patient Office Visit  Subjective    Patient ID: Dominique Barber, female    DOB: [birth year]  Age: 67 y.o. MRN: [id number]  CC:  Chief Complaint  Patient presents with   Medical Management of Chronic Issues    HPI Dominique Barber presents for follow up of skin lesion. Consultant had to be rescheduled for an emergency and so patient has not yet been seen.   Outpatient Encounter Medications as of 11/24/2022  Medication Sig   acetaminophen (TYLENOL) 500 MG tablet Take 500 mg by mouth every 6 (six) hours as needed for mild pain.   albuterol (VENTOLIN HFA) 108 (90 Base) MCG/ACT inhaler Inhale 1-2 puffs into the lungs every 6 (six) hours as needed for wheezing or shortness of breath.   Alpha-D-Galactosidase (BEANO PO) Take 2 capsules by mouth with breakfast, with lunch, and with evening meal.   Calcium Carb-Cholecalciferol (CALCIUM+D3 PO) Take 1 tablet by mouth at bedtime.   cetirizine (ZYRTEC) 10 MG tablet TAKE 1 TABLET DAILY   Evolocumab (REPATHA SURECLICK) 140 MG/ML SOAJ Inject 140 mg into the skin every 14 (fourteen) days.   levothyroxine (SYNTHROID) 50 MCG tablet TAKE ONE AND ONE-HALF TABLETS DAILY BEFORE BREAKFAST   LINZESS 145 MCG CAPS capsule Take 145 mcg by mouth daily.   metoprolol succinate (TOPROL-XL) 25 MG 24 hr tablet TAKE 1 TABLET TWICE A DAY (DISCONTINUE 50 MG)   nitroGLYCERIN (NITROSTAT) 0.4 MG SL tablet Place 1 tablet (0.4 mg total) under the tongue every 5 (five) minutes x 3 doses as needed for chest pain.   Peppermint Oil (IBGARD PO)    polyethylene glycol powder (GLYCOLAX/MIRALAX) 17 GM/SCOOP powder Take 1 Container by mouth once.   potassium chloride (KLOR-CON) 10 MEQ tablet Take 1 tablet (10 mEq total) by mouth daily.   Probiotic Product (ALIGN) 4 MG CAPS Take 4 mg by mouth in the morning.   Simethicone 125 MG TABS Take 125 mg by mouth in the morning, at noon, in the evening, and at bedtime.   XARELTO 20 MG TABS tablet TAKE 1 TABLET DAILY WITH SUPPER    alum & mag hydroxide-simeth (MAALOX/MYLANTA) 200-200-20 MG/5ML suspension Take 15-30 mLs by mouth every 6 (six) hours as needed for indigestion or flatulence.   No facility-administered encounter medications on file as of 11/24/2022.    Past Medical History:  Diagnosis Date   ACS (acute coronary syndrome) (HCC) 01/06/2020   With progressive angina while troponin elevation--borderline-NON-STEMI   ANTERIOR STEMI of LAD (x2). 01/19/1996   Complicated by VF arrest, and anoxic brain injury with status epilepticus; POBA of LAD --> 6 months later, after 2 vessel CABG she had postop complication with occluded LIMA/second anterior STEMI   CAD in native artery & Grafts 01/1996   a) 1/'98: Ant STEMI => LAD POBA; b) 2/'98: 95% prox LAD @ POBA site --> BMS PCI (3.0 mm x 25 mm); c) 7/98 (5 months later) => BMS ISR of LAD--> CABGx2 (LIMA-LAD, SVG-D1 -> Emergent redo w/ SVG-LAD b/c acute LIMA occlusion); d) 01/2020: ACS -> DES PCI of SVG-LAD (75%&85%), TO of SVG-D1.   COPD (chronic obstructive pulmonary disease) (HCC)    GERD (gastroesophageal reflux disease)    Hiatal hernia    Hyperlipidemia    Hypothyroidism    On Synthroid   Iron deficiency anemia    Irritable bowel syndrome    Followed by Dr. Charna Elizabeth.   Ischemic cardiomyopathy 01/03/2020   ECHO (ACS): EF 45-50% (by Cath EF 40-45%) - Apical Inferior/Apical  Anteroseptal & Apex akinetic, GR II DD.    PAF (paroxysmal atrial fibrillation) (HCC) 05/03/2014   New onset - originally with RVR   PONV (postoperative nausea and vomiting)    very gag reflex   S/P CABG x 2; with EMERGENT Redo x 1. 07/1996   Initially LIMA-LAD, SVG-D1 --> immediate LIMA failure post CABG with anterior MI --> urgent redo SVG-LAD beyond D2.; Widely patent grafts as of 2005 with left dominant system. Graft to the diagonal branch has a very small target vessel but the vein graft to LAD retrograde fills a large bifurcating D2 and antegrade fills a large D3.   Tobacco abuse      Past Surgical History:  Procedure Laterality Date   APPENDECTOMY     BIOPSY  05/08/2020   Procedure: BIOPSY;  Surgeon: Jeani Hawking, MD;  Location: [facility];  Service: Endoscopy;;   BREAST EXCISIONAL BIOPSY Right    CESAREAN SECTION     CHOLECYSTECTOMY     CORONARY ANGIOPLASTY  01/19/1996   Acute anterior STEMI- LAD POBA:  p-m LAD 70-80% narrowing & focal 95% stenosis in distal 3rd --> Treated with Emergent PTCA( POBA)  (Dr. Jonette Eva   CORONARY ANGIOPLASTY WITH STENT PLACEMENT  02/26/1996   3.0x80mm Multi-Link stent to prox/mid LAD (stenosis at recent PTCA site) (Dr. Jonette Eva)   CORONARY ARTERY BYPASS GRAFT  07/31/1996   x2; LIMA to LAD and SVG to 1st diagonal (Dr. Molinda Bailiff)   CORONARY ARTERY BYPASS GRAFT  07/31/1996   x1; SVG to distal LAD (Dr. Molinda Bailiff)   CORONARY STENT INTERVENTION N/A 01/08/2020   Procedure: CORONARY STENT INTERVENTION;  Surgeon: Marykay Lex, MD;  Location: [facility];; , SVG-LAD severe focal disease with 75% followed by 85% calcified stenosis--DES PCI (Resolute Onyx 3.5 mm x 38 mm - 3.6 mm)   ESOPHAGOGASTRODUODENOSCOPY (EGD) WITH PROPOFOL N/A 05/08/2020   Procedure: ESOPHAGOGASTRODUODENOSCOPY (EGD) WITH PROPOFOL;  Surgeon: Jeani Hawking, MD;  Location: [facility];  Service: Endoscopy;  Laterality: N/A;   LEFT HEART CATH AND CORONARY ANGIOGRAPHY  07/30/1996   normal L main, LAD w/95% stenosis just before stent, diffuse 80-85% end-stent restenosis, 1st diagonal with70-75% ostial stenosis, large optional diagonal that was normal, Cfx with 2 marginals and distal PLA (all normal), RCA normal (Dr. Jonette Eva)   LEFT HEART CATH AND CORONARY ANGIOGRAPHY  01/19/1996   acute anterior wall MI, anoxic encephalopahty, VF; LCfx free of disease and dominant, RCA patent, L main short and patent, LAD with 70-80% narrowing and focal 95% stenosis in distal 3rd => PTCA  (Dr. Jonette Eva)   LEFT HEART CATH AND CORONARY ANGIOGRAPHY  01/18/2017   acute  anterior wall MI, anoxic encephalopahty, VF; LCfx free of disease and dominant, RCA patent, L main short and patent, LAD with 70-80% narrowing and focal 95% stenosis in distal 3rd  (Dr. Jonette Eva)   LEFT HEART CATH AND CORS/GRAFTS ANGIOGRAPHY N/A 01/08/2020   Procedure: LEFT HEART CATH AND CORS/GRAFTS ANGIOGRAPHY;  Surgeon: Marykay Lex, MD;  Location: [facility]; EF 40 to 45%. Mildly elevated LVEDP. Proximal LAD 100% occlusion, mid-distal LAD fills via SVG that has collaterals to D1. Normal dominant native LCx and nondominant RCA. SVG-D1 ostial 100% CTO, SVG-LAD severe focal disease with 75% followed by 85% calcified stenosis--DES PCI   LEFT HEART CATH AND CORS/GRAFTS ANGIOGRAPHY  01/31/2003   LAD totally occluded in prox 3rd, patent Cfx, SVG to mid LAd patent, SVG to small DX1 patent, LIMA to  LAD was atretic and essentially occluded in junction of prox & mid 3rd, R barciocephalic and subclavian were normal (Dr. Jonette Eva)   NM [facility] PERF WALL MOTION  10/2010; 06/2014   a) LexiScan Cardiolite - mild perfusion defect r/t infarct/scar with mild periifarct ischemia in mid anterior & apical anterior region; EF 67%; abnormal, low risk study;; b) Small, moderate intensity Fixed perfusion defect - mid Anterior Wall c/w known Anterior MI. LOW RISK    OVARIAN CYST REMOVAL     TRANSTHORACIC ECHOCARDIOGRAM  02/2012; 06/2014   a) EF 50-55%, mild HK of anterospetal myocardium; mild MR;; b) EF 55-60%, mild HK of apical anterior wall.   Mild MR   TRANSTHORACIC ECHOCARDIOGRAM  01/02/2020   EF 45 to 50%. Apical inferior, apical septal and apex akinetic. GRII DD. Otherwise normal valves, normal RV. Normal atria. Normal pressures.    Family History  Problem Relation Age of Onset   Heart failure Other    Diabetes Other    Diabetes Mother    Breast cancer Mother    Thyroid disease Paternal Aunt    Thyroid disease Maternal Grandmother     Social History   Socioeconomic History   Marital status:  Married    Spouse name: Not on file   Number of children: 3   Years of education: Not on file   Highest education level: 12th grade  Occupational History    Employer: DISABLED  Tobacco Use   Smoking status: Former    Current packs/day: 0.00    Types: Cigarettes    Quit date: 01/04/2020    Years since quitting: 2.9   Smokeless tobacco: Former  Building services engineer status: Never Used  Substance and Sexual Activity   Alcohol use: No    Alcohol/week: 0.0 standard drinks of alcohol   Drug use: Never   Sexual activity: Not on file  Other Topics Concern   Not on file  Social History Narrative   Married, mother of 3 children (2 living sons age 79-26 and 30-31; her daughter was the victim of a murder that took place sometime around the time of the patient's MI. She was under significant amount of stress as her daughter had gone missing. She was never found. Finally the suspect was charged and convicted in 2009. She had sessile he stop smoking until that time frame when it brought back memories and she is now back to smoking a half pack a day.She does not get routine exercise, but is active around the house and doing housecleaning chores.She does not drink alcohol.         Family history: mgm died at 76, several maternal aunts and uncles all died in their 74's as well; mother didn't have any heart issues, had dementia, DM, died in her 67's; father lung cancer - smoker died at 40; 1 brother w/o heart issues; 2 living children - one with seizure disorder, no cholesterol issues known       Diet: doesn't eat much; has problems with GI/bloating since bypass; maybe 1-1.5 meals per day; oatmeal in the monings; sandwiches/soups in small amounts in the day; IBS issues more constipation; linzess, simethicone, maalox       Exercise:  housework   Social Determinants of Health   Financial Resource Strain: Low Risk  (11/24/2022)   Overall Financial Resource Strain (CARDIA)    Difficulty of Paying Living  Expenses: Not very hard  Food Insecurity: No Food Insecurity (11/24/2022)   Hunger Vital Sign  Worried About Programme researcher, broadcasting/film/video in the Last Year: Never true    Ran Out of Food in the Last Year: Never true  Transportation Needs: No Transportation Needs (11/24/2022)   PRAPARE - Administrator, Civil Service (Medical): No    Lack of Transportation (Non-Medical): No  Physical Activity: Insufficiently Active (11/24/2022)   Exercise Vital Sign    Days of Exercise per Week: 3 days    Minutes of Exercise per Session: 20 min  Stress: No Stress Concern Present (11/24/2022)   [employer] of Occupational Health - Occupational Stress Questionnaire    Feeling of Stress : Not at all  Social Connections: Unknown (11/24/2022)   Social Connection and Isolation Panel [NHANES]    Frequency of Communication with Friends and Family: Twice a week    Frequency of Social Gatherings with Friends and Family: Patient declined    Attends Religious Services: Patient declined    Database administrator or Organizations: No    Attends Engineer, structural: Not on file    Marital Status: Married  Catering manager Violence: Not on file    Review of Systems  All other systems reviewed and are negative.       Objective    BP 123/79   Pulse 81   Temp 97.9 F (36.6 C) (Oral)   Resp 16   Wt 163 lb (73.9 kg)   SpO2 96%   BMI 29.81 kg/m   Physical Exam Vitals and nursing note reviewed.  Constitutional:      General: She is not in acute distress. Cardiovascular:     Rate and Rhythm: Normal rate and regular rhythm.  Pulmonary:     Effort: Pulmonary effort is normal.     Breath sounds: Normal breath sounds.  Abdominal:     Palpations: Abdomen is soft.     Tenderness: There is no abdominal tenderness.  Skin:    Findings: Lesion (back) present.  Neurological:     General: No focal deficit present.     Mental Status: She is alert and oriented to person, place, and time.          Assessment & Plan:   Essential hypertension  Skin lesion of back     Return in about 4 months (around 03/24/2023) for follow up.   Tommie Raymond, MD

## 2022-11-28 ENCOUNTER — Encounter: Payer: Self-pay | Admitting: Family Medicine

## 2022-11-28 ENCOUNTER — Other Ambulatory Visit: Payer: Medicare Other

## 2022-11-28 DIAGNOSIS — R7989 Other specified abnormal findings of blood chemistry: Secondary | ICD-10-CM | POA: Diagnosis not present

## 2022-11-28 DIAGNOSIS — I480 Paroxysmal atrial fibrillation: Secondary | ICD-10-CM | POA: Diagnosis not present

## 2022-11-28 DIAGNOSIS — I255 Ischemic cardiomyopathy: Secondary | ICD-10-CM | POA: Diagnosis not present

## 2022-11-28 DIAGNOSIS — I25710 Atherosclerosis of autologous vein coronary artery bypass graft(s) with unstable angina pectoris: Secondary | ICD-10-CM | POA: Diagnosis not present

## 2022-11-28 DIAGNOSIS — I2102 ST elevation (STEMI) myocardial infarction involving left anterior descending coronary artery: Secondary | ICD-10-CM | POA: Diagnosis not present

## 2022-11-28 DIAGNOSIS — I25119 Atherosclerotic heart disease of native coronary artery with unspecified angina pectoris: Secondary | ICD-10-CM | POA: Diagnosis not present

## 2022-11-28 NOTE — Progress Notes (Signed)
Patient is in office today for a nurse visit for  Labs . Patient

## 2022-11-29 LAB — LIPID PANEL
Chol/HDL Ratio: 2 {ratio} (ref 0.0–4.4)
Cholesterol, Total: 125 mg/dL (ref 100–199)
HDL: 63 mg/dL (ref >39)
LDL Chol Calc (NIH): 41 mg/dL (ref 0–99)
Triglycerides: 123 mg/dL (ref 0–149)
VLDL Cholesterol Cal: 21 mg/dL (ref 5–40)

## 2022-11-29 LAB — HEPATIC FUNCTION PANEL
ALT: 13 [IU]/L (ref 0–32)
AST: 13 [IU]/L (ref 0–40)
Albumin: 4.2 g/dL (ref 3.9–4.9)
Alkaline Phosphatase: 93 [IU]/L (ref 44–121)
Bilirubin Total: 0.2 mg/dL (ref 0.0–1.2)
Bilirubin, Direct: 0.1 mg/dL (ref 0.00–0.40)
Total Protein: 7.1 g/dL (ref 6.0–8.5)

## 2022-11-29 LAB — HEMOGLOBIN A1C
Est. average glucose Bld gHb Est-mCnc: 143 mg/dL
Hgb A1c MFr Bld: 6.6 % — ABNORMAL HIGH (ref 4.8–5.6)

## 2022-11-29 LAB — SPECIMEN STATUS REPORT

## 2022-12-29 ENCOUNTER — Other Ambulatory Visit: Payer: Self-pay

## 2022-12-29 ENCOUNTER — Other Ambulatory Visit: Payer: Self-pay | Admitting: Cardiology

## 2022-12-29 DIAGNOSIS — E890 Postprocedural hypothyroidism: Secondary | ICD-10-CM

## 2022-12-29 MED ORDER — LEVOTHYROXINE SODIUM 50 MCG PO TABS
ORAL_TABLET | ORAL | 1 refills | Status: DC
Start: 2022-12-29 — End: 2022-12-30

## 2022-12-30 ENCOUNTER — Other Ambulatory Visit: Payer: Self-pay

## 2022-12-30 DIAGNOSIS — E890 Postprocedural hypothyroidism: Secondary | ICD-10-CM

## 2022-12-30 MED ORDER — LEVOTHYROXINE SODIUM 50 MCG PO TABS
ORAL_TABLET | ORAL | 1 refills | Status: DC
Start: 2022-12-30 — End: 2023-01-13

## 2023-01-13 ENCOUNTER — Other Ambulatory Visit: Payer: Self-pay

## 2023-01-13 DIAGNOSIS — E890 Postprocedural hypothyroidism: Secondary | ICD-10-CM

## 2023-01-13 MED ORDER — LEVOTHYROXINE SODIUM 50 MCG PO TABS
ORAL_TABLET | ORAL | 1 refills | Status: DC
Start: 2023-01-13 — End: 2023-07-11

## 2023-01-28 ENCOUNTER — Other Ambulatory Visit: Payer: Self-pay | Admitting: Cardiology

## 2023-02-13 ENCOUNTER — Encounter: Payer: Self-pay | Admitting: Family Medicine

## 2023-02-16 DIAGNOSIS — L821 Other seborrheic keratosis: Secondary | ICD-10-CM | POA: Diagnosis not present

## 2023-02-25 ENCOUNTER — Encounter: Payer: Self-pay | Admitting: Cardiology

## 2023-02-25 ENCOUNTER — Telehealth: Payer: Self-pay | Admitting: Cardiology

## 2023-02-25 NOTE — Telephone Encounter (Signed)
  Patient called concerned over feeling short of breath that has been progressive but worsened over the last 24 hrs. Along with mild chest discomfort that she is unsure if is being caused by her heart, her hiatal hernia or her IBS. She informed me that her chest discomfort is very mild and does not remind her of her previous STEMI. She states that her SBP was at 105 and her HR was in the 50s. She states that she her heart monitor showed that she was in [name] which is a previously known diagnosis for her.  Instructed patient that with her new shortness of breath with exertion and chest discomfort that she should go to the emergency department for further evaluation. Patient stated that she did not want to go to the ED to wait in the waiting room and put herself at risk of getting COVID again. Discussed that there would be no definitive way for me to ensure that her symptoms were not concerning just speaking to her over the phone. Patient stated that her symptoms felt mild and she would go to the ED if they were to worsen.  Instructed patient that a member of our triage team would contact her on Monday to check in on her and create an close follow up appointment for further evaluation. Patient agreed to this and verbalized understanding that she should go to the ED if she has new or worsening symptoms or if at any time begins to feel poorly. Patient understands that without going to the ED at this time she will not get properly evaluated and she agrees to go the ED if she feels it is necessary.   Note sent to triage pool to call patient on Monday to check in and schedule follow up appointment with [facility] office.  Olena Leatherwood, PA-C 02/25/2023 2:14 PM

## 2023-02-27 NOTE — Telephone Encounter (Signed)
 Noted  Patient to keep 2/25 OV at 4pm with DOD

## 2023-02-27 NOTE — Telephone Encounter (Signed)
 Patient identification verified by 2 forms. Marilynn Rail, RN    Called and spoke to patient  Patient states:   -needs OV with Dr. Herbie Baltimore   -has SOB with ambulation   -SOB on going for 3 days   -SOB remains the same, not worsening   -also has chest pain that comes and goes   -chest pain also started 3 days ago   -chest pain lasts for a few seconds to 1 minute   -chest pain occurs both at rest and with activity   -last episode of chest pain was last night while sitting   -she is not actively having chest pain or SOB   -2/24 BP: 120/70 HR: 69 RN offered patient OV for 2/24 with DOD and 2/26 with APP, patient declines  Patient requests late evening appointments after 3:30pm due to GI concerns  Informed patient no OV available this week or next for requested time  Per patient request OV scheduled for 3/13 at 3:15pm  Encouraged patient to present to ED if sx change or worsen  Patient verbalized understanding, no questions at this time

## 2023-02-27 NOTE — Telephone Encounter (Signed)
 Unfortunately I do not have any availability in the [place] line clinic this week next week or the following week because I am rounding this week and the following week and next week on Monday I am already fully booked on my regular day and way overbooked on my corder day to the point of 12 patient's.  I could potentially see her in [place] at 10:20 AM or 10:40 AM.  Otherwise the best thing for her to do would be to be seen by an APP or Dr. Sw[place] tomorrow as scheduled.   Bryan Lemma, MD

## 2023-02-27 NOTE — Telephone Encounter (Signed)
 Called and spoke with patient. She has uploaded kardia results to [facility] message for evaluation of Afib. Patient has had increasing fatigue and shortness of breath. Made a follow up appointment with DOD for tomorrow 2/25 at 4pm per patient request. She can't come at any other time that was offered for today.

## 2023-02-28 ENCOUNTER — Ambulatory Visit: Payer: Medicare Other | Attending: Cardiology | Admitting: Cardiology

## 2023-02-28 ENCOUNTER — Encounter: Payer: Self-pay | Admitting: Cardiology

## 2023-02-28 VITALS — BP 122/74 | HR 95 | Ht 62.0 in | Wt 161.2 lb

## 2023-02-28 DIAGNOSIS — I480 Paroxysmal atrial fibrillation: Secondary | ICD-10-CM | POA: Insufficient documentation

## 2023-02-28 DIAGNOSIS — I25710 Atherosclerosis of autologous vein coronary artery bypass graft(s) with unstable angina pectoris: Secondary | ICD-10-CM | POA: Diagnosis not present

## 2023-02-28 DIAGNOSIS — Z951 Presence of aortocoronary bypass graft: Secondary | ICD-10-CM | POA: Insufficient documentation

## 2023-02-28 DIAGNOSIS — R0609 Other forms of dyspnea: Secondary | ICD-10-CM | POA: Diagnosis not present

## 2023-02-28 MED ORDER — ALBUTEROL SULFATE HFA 108 (90 BASE) MCG/ACT IN AERS: 1.0000 | INHALATION_SPRAY | Freq: Four times a day (QID) | RESPIRATORY_TRACT | 2 refills | Status: AC | PRN

## 2023-02-28 NOTE — Progress Notes (Unsigned)
 Cardiology Office Note:  .   Date:  02/28/2023  ID:  Dominique Barber, DOB Sep 21, 1955, MRN [id number] PCP: Georganna Skeans, MD  [facility] Providers Cardiologist:  Bryan Lemma, MD { Click to update primary MD,subspecialty MD or APP then REFRESH:1}   History of Present Illness: .   Dominique Barber is a 68 y.o. female seen as a work in for evaluation of Afib. She has a history of CAD s/p cardiac arrest and subsequent CABG, ischemic CM, HLD and history of paroxysmal Afib. Last Echo in Dec 2021 showed EF 45-50%.   She reports that more recently she has been more SOB with activity especially going up stairs. No chest pain or swelling. On Saturday she went into Afib and was much more SOB. Afib confirmed on her Kardiomobile device. Rate was controlled. States it has been several months since her last Afib episode and typically lasts  30 minutes. She does report she has a lot of bloating with her IBS and wonders if this is contributing to her breathing issues.   ROS: As noted in HPI otherwise ROS is negative.   Studies Reviewed: Marland Kitchen       EKG Interpretation Date/Time:  Tuesday February 28 2023 16:37:53 EST Ventricular Rate:  75 PR Interval:  132 QRS Duration:  72 QT Interval:  378 QTC Calculation: 422 R Axis:   58  Text Interpretation: Normal sinus rhythm Anterior infarct (cited on or before 08-Jan-2020) T wave abnormality, consider lateral ischemia When compared with ECG of 28-Oct-2022 16:12, No significant change was found Confirmed by [place], Katelin Kutsch [id number]) on 02/28/2023 4:39:24 PM   Prior Echo in 2021  Risk Assessment/Calculations:    CHA2DS2-VASc Score = 4  {Confirm score is correct.  If not, click here to update score.  REFRESH note.  :1} This indicates a 4.8% annual risk of stroke. The patient's score is based upon: CHF History: 0 HTN History: 1 Diabetes History: 0 Stroke History: 0 Vascular Disease History: 1 Age Score: 1 Gender Score: 1   {This patient has a  significant risk of stroke if diagnosed with atrial fibrillation.  Please consider VKA or DOAC agent for anticoagulation if the bleeding risk is acceptable.   You can also use the SmartPhrase .[email or web address] for documentation.   :[id number]}         Physical Exam:   VS:  BP 122/74 (BP Location: Left Arm, Patient Position: Sitting, Cuff Size: Normal)   Pulse 95   Ht 5\' 2"  (1.575 m)   Wt 161 lb 3.2 oz (73.1 kg)   SpO2 96%   BMI 29.48 kg/m    Wt Readings from Last 3 Encounters:  02/28/23 161 lb 3.2 oz (73.1 kg)  11/24/22 163 lb (73.9 kg)  10/28/22 163 lb (73.9 kg)    GEN: Well nourished, well developed in no acute distress NECK: No JVD; No carotid bruits CARDIAC: RRR, no murmurs, rubs, gallops RESPIRATORY:  Clear to auscultation without rales, wheezing or rhonchi  ABDOMEN: Soft, non-tender, non-distended EXTREMITIES:  No edema; No deformity   ASSESSMENT AND PLAN: .    Paroxysmal Afib. Patient has converted to NSR. Rate was controlled in Afib. She is on Xarelto. Will monitor for now. If Afib becomes more frequent could consider AAD therapy vs ablation DOE. No chest pain. Worse with Afib. Will check CBC, BMET and BNP. Arrange follow up Echo.        Dispo: follow up with Dr Herbie Baltimore after Echo.   Signed, Murrell Elizondo [place], MD

## 2023-02-28 NOTE — Patient Instructions (Signed)
 Medication Instructions:  Continue same medications *If you need a refill on your cardiac medications before your next appointment, please call your pharmacy*   Lab Work: Bmet,bnp,cbc this week   Testing/Procedures: Echo   first available   Follow-Up: At [facility], you and your health needs are our priority.  As part of our continuing mission to provide you with exceptional heart care, we have created designated Provider Care Teams.  These Care Teams include your primary Cardiologist (physician) and Advanced Practice Providers (APPs -  Physician Assistants and Nurse Practitioners) who all work together to provide you with the care you need, when you need it.  We recommend signing up for the patient portal called "MyChart".  Sign up information is provided on this After Visit Summary.  MyChart is used to connect with patients for Virtual Visits (Telemedicine).  Patients are able to view lab/test results, encounter notes, upcoming appointments, etc.  Non-urgent messages can be sent to your provider as well.   To learn more about what you can do with MyChart, go to [email or web address].    Your next appointment:  After Echo    Provider:  Dr.Harding

## 2023-03-03 DIAGNOSIS — I480 Paroxysmal atrial fibrillation: Secondary | ICD-10-CM | POA: Diagnosis not present

## 2023-03-03 DIAGNOSIS — R0609 Other forms of dyspnea: Secondary | ICD-10-CM | POA: Diagnosis not present

## 2023-03-03 DIAGNOSIS — Z951 Presence of aortocoronary bypass graft: Secondary | ICD-10-CM | POA: Diagnosis not present

## 2023-03-03 DIAGNOSIS — I25710 Atherosclerosis of autologous vein coronary artery bypass graft(s) with unstable angina pectoris: Secondary | ICD-10-CM | POA: Diagnosis not present

## 2023-03-04 LAB — CBC WITH DIFFERENTIAL/PLATELET
Basophils Absolute: 0.1 10*3/uL (ref 0.0–0.2)
Basos: 1 %
EOS (ABSOLUTE): 0.2 10*3/uL (ref 0.0–0.4)
Eos: 2 %
Hematocrit: 23.4 % — ABNORMAL LOW (ref 34.0–46.6)
Hemoglobin: 7.5 g/dL — ABNORMAL LOW (ref 11.1–15.9)
Immature Grans (Abs): 0.1 10*3/uL (ref 0.0–0.1)
Immature Granulocytes: 1 %
Lymphocytes Absolute: 1.5 10*3/uL (ref 0.7–3.1)
Lymphs: 19 %
MCH: 27 pg (ref 26.6–33.0)
MCHC: 32.1 g/dL (ref 31.5–35.7)
MCV: 84 fL (ref 79–97)
Monocytes Absolute: 0.6 10*3/uL (ref 0.1–0.9)
Monocytes: 7 %
Neutrophils Absolute: 5.4 10*3/uL (ref 1.4–7.0)
Neutrophils: 70 %
Platelets: 406 10*3/uL (ref 150–450)
RBC: 2.78 x10E6/uL — ABNORMAL LOW (ref 3.77–5.28)
RDW: 14.2 % (ref 11.7–15.4)
WBC: 7.7 10*3/uL (ref 3.4–10.8)

## 2023-03-04 LAB — BASIC METABOLIC PANEL
BUN/Creatinine Ratio: 17 (ref 12–28)
BUN: 19 mg/dL (ref 8–27)
CO2: 19 mmol/L — ABNORMAL LOW (ref 20–29)
Calcium: 8.9 mg/dL (ref 8.7–10.3)
Chloride: 107 mmol/L — ABNORMAL HIGH (ref 96–106)
Creatinine, Ser: 1.11 mg/dL — ABNORMAL HIGH (ref 0.57–1.00)
Glucose: 116 mg/dL — ABNORMAL HIGH (ref 70–99)
Potassium: 4.6 mmol/L (ref 3.5–5.2)
Sodium: 141 mmol/L (ref 134–144)
eGFR: 54 mL/min/{1.73_m2} — ABNORMAL LOW (ref >59)

## 2023-03-04 LAB — BRAIN NATRIURETIC PEPTIDE: BNP: 136.1 pg/mL — ABNORMAL HIGH (ref 0.0–100.0)

## 2023-03-06 DIAGNOSIS — D509 Iron deficiency anemia, unspecified: Secondary | ICD-10-CM | POA: Diagnosis not present

## 2023-03-06 DIAGNOSIS — K921 Melena: Secondary | ICD-10-CM | POA: Diagnosis not present

## 2023-03-06 DIAGNOSIS — R0602 Shortness of breath: Secondary | ICD-10-CM | POA: Diagnosis not present

## 2023-03-06 DIAGNOSIS — Z860100 Personal history of colon polyps, unspecified: Secondary | ICD-10-CM | POA: Diagnosis not present

## 2023-03-07 ENCOUNTER — Telehealth: Payer: Self-pay | Admitting: Cardiology

## 2023-03-07 ENCOUNTER — Other Ambulatory Visit: Payer: Self-pay | Admitting: Gastroenterology

## 2023-03-07 ENCOUNTER — Telehealth: Payer: Self-pay | Admitting: Emergency Medicine

## 2023-03-07 ENCOUNTER — Telehealth: Payer: Self-pay | Admitting: *Deleted

## 2023-03-07 NOTE — Telephone Encounter (Signed)
 [name] (Patient)   Subject  Kourtlyn, [name] (Patient)   Topic  General - Other    Communication  Reason for CRM: Patient husband Health and safety inspector) is calling to let Dr. Andrey Campanile know his wife [name]) had lab work done on 2/28 at [facility] with Dr.Jordan. Natasha was diagnosed with anemia and is scheduled for a blood transfusion on 3/7. Patient husband said Dr. Andrey Campanile can review the lab results on MyChart.

## 2023-03-07 NOTE — Telephone Encounter (Signed)
 Spoke to patient Dr.Jordan's advice given.

## 2023-03-07 NOTE — Telephone Encounter (Signed)
 Patient wants a call back to discuss her anemia.

## 2023-03-07 NOTE — Telephone Encounter (Signed)
   Pre-operative Risk Assessment    Patient Name: Dominique Barber  DOB: [birth year] MRN: [id number]   Date of last office visit: 02/28/23 DR. Swaziland Date of next office visit: 04/11/23 DR. HARDING   Request for Surgical Clearance    Procedure:   EGD/COLONOSCOPY  Date of Surgery:  Clearance 03/17/23                                Surgeon:  DR. HUNG Surgeon's Group or Practice Name:  [facility] Phone number:  [phone] Fax number:  [phone]   Type of Clearance Requested:   - Medical  - Pharmacy:  Hold Rivaroxaban (Xarelto)     Type of Anesthesia:   PROPOFOL   Additional requests/questions:    Elpidio Anis   03/07/2023, 9:39 AM

## 2023-03-07 NOTE — Telephone Encounter (Signed)
 Spoke to patient she stated she wanted to talk to Dr.Jordan about her anemia.Advised Dr.Jordan is out of office this week.Stated she is scheduled to have a blood transfusion this Thurs and a colonoscopy next Friday.She wanted to know if she needs to continue to hold Xarelto.Advised she should continue to hold Xarelto.I will make Dr.Jordan aware.

## 2023-03-08 NOTE — Telephone Encounter (Signed)
   Patient Name: Dominique Barber  DOB: 02-Jun-1955 MRN: [id number]  Primary Cardiologist: Bryan Lemma, MD  Chart reviewed as part of pre-operative protocol coverage. Given past medical history and time since last visit, based on ACC/AHA guidelines, Dominique Barber is at acceptable risk for the planned procedure without further cardiovascular testing.   Per office protocol, patient can hold Xarelto for 2 days prior to procedure   I will route this recommendation to the requesting party via Epic fax function and remove from pre-op pool.  Please call with questions.  Denyce Robert, NP 03/08/2023, 1:32 PM

## 2023-03-08 NOTE — Telephone Encounter (Signed)
 Patient with diagnosis of afib on Xarelto for anticoagulation.    Procedure: EGD/COLONOSCOPY  Date of procedure: 03/17/23   CHA2DS2-VASc Score = 4   This indicates a 4.8% annual risk of stroke. The patient's score is based upon: CHF History: 0 HTN History: 1 Diabetes History: 0 Stroke History: 0 Vascular Disease History: 1 Age Score: 1 Gender Score: 1      CrCl 46 ml/min Platelet count 406  Per office protocol, patient can hold Xarelto for 2 days prior to procedure.    **This guidance is not considered finalized until pre-operative APP has relayed final recommendations.**

## 2023-03-09 ENCOUNTER — Non-Acute Institutional Stay (HOSPITAL_COMMUNITY)
Admission: RE | Admit: 2023-03-09 | Discharge: 2023-03-09 | Disposition: A | Discharge status: Home / Self Care | Source: Ambulatory Visit | Attending: Internal Medicine | Admitting: Internal Medicine

## 2023-03-09 VITALS — BP 151/90 | HR 69 | Temp 97.9°F | Resp 16

## 2023-03-09 DIAGNOSIS — D509 Iron deficiency anemia, unspecified: Secondary | ICD-10-CM | POA: Insufficient documentation

## 2023-03-09 DIAGNOSIS — K921 Melena: Secondary | ICD-10-CM | POA: Insufficient documentation

## 2023-03-09 LAB — ABO/RH: ABO/RH(D): A POS

## 2023-03-09 LAB — PREPARE RBC (CROSSMATCH)

## 2023-03-09 MED ORDER — SODIUM CHLORIDE 0.9% IV SOLUTION: Freq: Once | INTRAVENOUS | Status: AC

## 2023-03-09 NOTE — Progress Notes (Signed)
 PATIENT CARE CENTER NOTE   Diagnosis: Iron deficiency anemia (D50.9)   Provider: Jeani Hawking, MD    Procedure: Blood transfusion    Note: Patient received 2 units PRBC's via PIV. No pre-medications ordered. Patient tolerated transfusions well with no adverse reaction. Vital signs stable. Printed AVS given and discharged instructions reviewed. Patient alert, oriented and ambulatory at discharge. Discharged home with husband.

## 2023-03-10 ENCOUNTER — Encounter (HOSPITAL_COMMUNITY): Payer: Self-pay | Admitting: Gastroenterology

## 2023-03-10 LAB — BPAM RBC
Blood Product Expiration Date: 202503292359
Blood Product Expiration Date: 202503292359
ISSUE DATE / TIME: 202503061032
ISSUE DATE / TIME: 202503061032
Unit Type and Rh: 6200
Unit Type and Rh: 6200

## 2023-03-10 LAB — TYPE AND SCREEN
ABO/RH(D): A POS
Antibody Screen: NEGATIVE
Unit division: 0
Unit division: 0

## 2023-03-16 ENCOUNTER — Ambulatory Visit: Payer: Medicare Other | Admitting: Cardiology

## 2023-03-17 ENCOUNTER — Other Ambulatory Visit: Payer: Self-pay

## 2023-03-17 ENCOUNTER — Ambulatory Visit (HOSPITAL_COMMUNITY): Admitting: Anesthesiology

## 2023-03-17 ENCOUNTER — Other Ambulatory Visit (HOSPITAL_COMMUNITY): Payer: Self-pay | Admitting: Gastroenterology

## 2023-03-17 ENCOUNTER — Ambulatory Visit (HOSPITAL_COMMUNITY)
Admission: RE | Admit: 2023-03-17 | Discharge: 2023-03-17 | Disposition: A | Discharge status: Home / Self Care | Attending: Gastroenterology | Admitting: Gastroenterology

## 2023-03-17 ENCOUNTER — Encounter (HOSPITAL_COMMUNITY)
Admission: RE | Disposition: A | Discharge status: Home / Self Care | Payer: Self-pay | Source: Home / Self Care | Attending: Gastroenterology

## 2023-03-17 DIAGNOSIS — K5730 Diverticulosis of large intestine without perforation or abscess without bleeding: Secondary | ICD-10-CM | POA: Insufficient documentation

## 2023-03-17 DIAGNOSIS — J449 Chronic obstructive pulmonary disease, unspecified: Secondary | ICD-10-CM | POA: Insufficient documentation

## 2023-03-17 DIAGNOSIS — K449 Diaphragmatic hernia without obstruction or gangrene: Secondary | ICD-10-CM | POA: Diagnosis not present

## 2023-03-17 DIAGNOSIS — D500 Iron deficiency anemia secondary to blood loss (chronic): Secondary | ICD-10-CM | POA: Diagnosis present

## 2023-03-17 DIAGNOSIS — D509 Iron deficiency anemia, unspecified: Secondary | ICD-10-CM | POA: Insufficient documentation

## 2023-03-17 DIAGNOSIS — K2970 Gastritis, unspecified, without bleeding: Secondary | ICD-10-CM

## 2023-03-17 DIAGNOSIS — Z951 Presence of aortocoronary bypass graft: Secondary | ICD-10-CM | POA: Diagnosis not present

## 2023-03-17 DIAGNOSIS — D123 Benign neoplasm of transverse colon: Secondary | ICD-10-CM | POA: Diagnosis not present

## 2023-03-17 DIAGNOSIS — I10 Essential (primary) hypertension: Secondary | ICD-10-CM | POA: Diagnosis not present

## 2023-03-17 DIAGNOSIS — C183 Malignant neoplasm of hepatic flexure: Secondary | ICD-10-CM | POA: Insufficient documentation

## 2023-03-17 DIAGNOSIS — K2950 Unspecified chronic gastritis without bleeding: Secondary | ICD-10-CM | POA: Diagnosis not present

## 2023-03-17 DIAGNOSIS — I2510 Atherosclerotic heart disease of native coronary artery without angina pectoris: Secondary | ICD-10-CM | POA: Diagnosis not present

## 2023-03-17 DIAGNOSIS — K222 Esophageal obstruction: Secondary | ICD-10-CM | POA: Insufficient documentation

## 2023-03-17 DIAGNOSIS — D122 Benign neoplasm of ascending colon: Secondary | ICD-10-CM | POA: Diagnosis not present

## 2023-03-17 DIAGNOSIS — I252 Old myocardial infarction: Secondary | ICD-10-CM | POA: Diagnosis not present

## 2023-03-17 DIAGNOSIS — K635 Polyp of colon: Secondary | ICD-10-CM | POA: Diagnosis not present

## 2023-03-17 DIAGNOSIS — Z955 Presence of coronary angioplasty implant and graft: Secondary | ICD-10-CM | POA: Insufficient documentation

## 2023-03-17 DIAGNOSIS — Z79899 Other long term (current) drug therapy: Secondary | ICD-10-CM | POA: Diagnosis not present

## 2023-03-17 DIAGNOSIS — K3189 Other diseases of stomach and duodenum: Secondary | ICD-10-CM | POA: Diagnosis not present

## 2023-03-17 DIAGNOSIS — C182 Malignant neoplasm of ascending colon: Secondary | ICD-10-CM

## 2023-03-17 DIAGNOSIS — K5790 Diverticulosis of intestine, part unspecified, without perforation or abscess without bleeding: Secondary | ICD-10-CM | POA: Diagnosis not present

## 2023-03-17 DIAGNOSIS — C185 Malignant neoplasm of splenic flexure: Secondary | ICD-10-CM | POA: Insufficient documentation

## 2023-03-17 DIAGNOSIS — Z87891 Personal history of nicotine dependence: Secondary | ICD-10-CM | POA: Diagnosis not present

## 2023-03-17 HISTORY — PX: ESOPHAGOGASTRODUODENOSCOPY: SHX5428

## 2023-03-17 HISTORY — PX: COLONOSCOPY: SHX5424

## 2023-03-17 LAB — CBC WITH DIFFERENTIAL/PLATELET
Abs Immature Granulocytes: 0.03 10*3/uL (ref 0.00–0.07)
Basophils Absolute: 0.1 10*3/uL (ref 0.0–0.1)
Basophils Relative: 1 %
Eosinophils Absolute: 0.1 10*3/uL (ref 0.0–0.5)
Eosinophils Relative: 1 %
HCT: 35.4 % — ABNORMAL LOW (ref 36.0–46.0)
Hemoglobin: 11 g/dL — ABNORMAL LOW (ref 12.0–15.0)
Immature Granulocytes: 0 %
Lymphocytes Relative: 13 %
Lymphs Abs: 1 10*3/uL (ref 0.7–4.0)
MCH: 26.6 pg (ref 26.0–34.0)
MCHC: 31.1 g/dL (ref 30.0–36.0)
MCV: 85.7 fL (ref 80.0–100.0)
Monocytes Absolute: 0.5 10*3/uL (ref 0.1–1.0)
Monocytes Relative: 6 %
Neutro Abs: 6.2 10*3/uL (ref 1.7–7.7)
Neutrophils Relative %: 79 %
Platelets: 283 10*3/uL (ref 150–400)
RBC: 4.13 MIL/uL (ref 3.87–5.11)
RDW: 14.6 % (ref 11.5–15.5)
WBC: 7.8 10*3/uL (ref 4.0–10.5)
nRBC: 0 % (ref 0.0–0.2)

## 2023-03-17 SURGERY — EGD (ESOPHAGOGASTRODUODENOSCOPY)
Anesthesia: Monitor Anesthesia Care

## 2023-03-17 MED ORDER — LIDOCAINE 2% (20 MG/ML) 5 ML SYRINGE
INTRAMUSCULAR | Status: DC | PRN
  Administered 2023-03-17: 100 mg via INTRAVENOUS

## 2023-03-17 MED ORDER — PROPOFOL 500 MG/50ML IV EMUL
INTRAVENOUS | Status: DC | PRN
  Administered 2023-03-17: 100 ug/kg/min via INTRAVENOUS

## 2023-03-17 MED ORDER — SODIUM CHLORIDE 0.9 % IV SOLN
INTRAVENOUS | Status: AC | PRN
  Administered 2023-03-17: 500 mL via INTRAMUSCULAR

## 2023-03-17 MED ORDER — SODIUM CHLORIDE 0.9 % IV SOLN: INTRAVENOUS | Status: DC | PRN

## 2023-03-17 MED ORDER — PROPOFOL 10 MG/ML IV BOLUS
INTRAVENOUS | Status: DC | PRN
  Administered 2023-03-17 (×2): 20 mg via INTRAVENOUS
  Administered 2023-03-17: 30 mg via INTRAVENOUS

## 2023-03-17 MED ORDER — PHENYLEPHRINE 80 MCG/ML (10ML) SYRINGE FOR IV PUSH (FOR BLOOD PRESSURE SUPPORT)
PREFILLED_SYRINGE | INTRAVENOUS | Status: DC | PRN
  Administered 2023-03-17: 160 ug via INTRAVENOUS

## 2023-03-17 NOTE — Discharge Instructions (Signed)

## 2023-03-17 NOTE — Op Note (Signed)
 [facility] Patient Name: Dominique Barber Procedure Date: 03/17/2023 MRN: [id number] Attending MD: Jeani Hawking , MD, [id number] Date of Birth: 1955-05-02 CSN: [id number] Age: 68 Admit Type: Outpatient Procedure:                Upper GI endoscopy Indications:              Iron deficiency anemia Providers:                Jeani Hawking, MD, Rogue Jury, RN, Salley Scarlet, Technician, Albertina Senegal. Alday CRNA, CRNA Referring MD:              Medicines:                Propofol per Anesthesia Complications:            No immediate complications. Estimated Blood Loss:     Estimated blood loss: none. Procedure:                Pre-Anesthesia Assessment:                           - Prior to the procedure, a History and Physical                            was performed, and patient medications and                            allergies were reviewed. The patient's tolerance of                            previous anesthesia was also reviewed. The risks                            and benefits of the procedure and the sedation                            options and risks were discussed with the patient.                            All questions were answered, and informed consent                            was obtained. Prior Anticoagulants: The patient has                            taken no anticoagulant or antiplatelet agents. ASA                            Grade Assessment: III - A patient with severe                            systemic disease. After reviewing the risks and  benefits, the patient was deemed in satisfactory                            condition to undergo the procedure.                           - Sedation was administered by an anesthesia                            professional. Deep sedation was attained.                           After obtaining informed consent, the endoscope was                             passed under direct vision. Throughout the                            procedure, the patient's blood pressure, pulse, and                            oxygen saturations were monitored continuously. The                            PCF-HQ190L ([id number]) Olympus colonoscope was                            introduced through the mouth, and advanced to the                            second part of duodenum. The upper GI endoscopy was                            accomplished without difficulty. The patient                            tolerated the procedure well. Scope In: Scope Out: Findings:      One benign-appearing, intrinsic mild stenosis was found at the       gastroesophageal junction. This stenosis measured 1 cm (inner diameter)       x less than one cm (in length). The stenosis was traversed.      A 2 cm hiatal hernia was present.      A few dispersed small erosions with no stigmata of recent bleeding were       found in the gastric antrum. Biopsies were taken with a cold forceps for       Helicobacter pylori testing.      The examined duodenum was normal. Impression:               - Benign-appearing esophageal stenosis.                           - 2 cm hiatal hernia.                           - Erosive  gastropathy with no stigmata of recent                            bleeding. Biopsied.                           - Normal examined duodenum. Moderate Sedation:      Not Applicable - Patient had care per Anesthesia. Recommendation:           - Patient has a contact number available for                            emergencies. The signs and symptoms of potential                            delayed complications were discussed with the                            patient. Return to normal activities tomorrow.                            Written discharge instructions were provided to the                            patient.                           - Resume previous diet.                            - Continue present medications.                           - Await pathology results. Procedure Code(s):        --- Professional ---                           [phone], Esophagogastroduodenoscopy, flexible,                            transoral; with biopsy, single or multiple Diagnosis Code(s):        --- Professional ---                           K22.2, Esophageal obstruction                           K44.9, Diaphragmatic hernia without obstruction or                            gangrene                           K31.89, Other diseases of stomach and duodenum                           D50.9, Iron deficiency anemia, unspecified CPT copyright 2022 American Medical Association. All rights reserved.  The codes documented in this report are preliminary and upon coder review may  be revised to meet current compliance requirements. Jeani Hawking, MD Jeani Hawking, MD 03/17/2023 11:02:32 AM This report has been signed electronically. Number of Addenda: 0

## 2023-03-17 NOTE — Transfer of Care (Signed)
 Immediate Anesthesia Transfer of Care Note  Patient: Dominique Barber  Procedure(s) Performed: EGD (ESOPHAGOGASTRODUODENOSCOPY) COLONOSCOPY  Patient Location: PACU  Anesthesia Type:MAC  Level of Consciousness: sedated  Airway & Oxygen Therapy: Patient Spontanous Breathing and Patient connected to face mask oxygen  Post-op Assessment: Report given to RN and Post -op Vital signs reviewed and stable  Post vital signs: Reviewed and stable  Last Vitals:  Vitals Value Taken Time  BP 175/73 03/17/23 0957  Temp    Pulse 59 03/17/23 0958  Resp 21 03/17/23 0958  SpO2 100 % 03/17/23 0958  Vitals shown include unfiled device data.  Last Pain:  Vitals:   03/17/23 0909  TempSrc: Temporal  PainSc: 0-No pain         Complications: No notable events documented.

## 2023-03-17 NOTE — Anesthesia Preprocedure Evaluation (Addendum)
 Anesthesia Evaluation  Patient identified by MRN, date of birth, ID band Patient awake    Reviewed: Allergy & Precautions, NPO status , Patient's Chart, lab work & pertinent test results, reviewed documented beta blocker date and time   History of Anesthesia Complications (+) PONV and history of anesthetic complications  Airway Mallampati: II  TM Distance: >3 FB Neck ROM: Full    Dental  (+) Dental Advisory Given   Pulmonary COPD, former smoker   Pulmonary exam normal breath sounds clear to auscultation       Cardiovascular hypertension, Pt. on home beta blockers and Pt. on medications + CAD, + Past MI, + Cardiac Stents and + CABG  Normal cardiovascular exam+ dysrhythmias Atrial Fibrillation  Rhythm:Regular Rate:Normal     Neuro/Psych Seizures -,     GI/Hepatic Neg liver ROS, hiatal hernia,GERD  ,,  Endo/Other  Hypothyroidism    Renal/GU negative Renal ROS     Musculoskeletal negative musculoskeletal ROS (+)    Abdominal   Peds  Hematology  (+) Blood dyscrasia (Xarelto), anemia   Anesthesia Other Findings   Reproductive/Obstetrics                             Anesthesia Physical Anesthesia Plan  ASA: 4  Anesthesia Plan: MAC   Post-op Pain Management: Minimal or no pain anticipated   Induction: Intravenous  PONV Risk Score and Plan: 3 and TIVA and Treatment may vary due to age or medical condition  Airway Management Planned: Simple Face Mask and Natural Airway  Additional Equipment:   Intra-op Plan:   Post-operative Plan:   Informed Consent: I have reviewed the patients History and Physical, chart, labs and discussed the procedure including the risks, benefits and alternatives for the proposed anesthesia with the patient or authorized representative who has indicated his/her understanding and acceptance.     Dental advisory given  Plan Discussed with: CRNA  Anesthesia  Plan Comments:         Anesthesia Quick Evaluation

## 2023-03-17 NOTE — Op Note (Signed)
 [facility] Patient Name: Dominique Barber Procedure Date: 03/17/2023 MRN: [id number] Attending MD: Jeani Hawking , MD, [id number] Date of Birth: Dec 11, 1955 CSN: [id number] Age: 68 Admit Type: Outpatient Procedure:                Colonoscopy Indications:              Heme positive stool, Iron deficiency anemia Providers:                Jeani Hawking, MD, Rogue Jury, RN, Salley Scarlet, Technician, Carmelia Roller CRNA, CRNA Referring MD:              Medicines:                Propofol per Anesthesia Complications:            No immediate complications. Estimated Blood Loss:     Estimated blood loss: none. Procedure:                Pre-Anesthesia Assessment:                           - Prior to the procedure, a History and Physical                            was performed, and patient medications and                            allergies were reviewed. The patient's tolerance of                            previous anesthesia was also reviewed. The risks                            and benefits of the procedure and the sedation                            options and risks were discussed with the patient.                            All questions were answered, and informed consent                            was obtained. Prior Anticoagulants: The patient has                            taken no anticoagulant or antiplatelet agents. ASA                            Grade Assessment: III - A patient with severe                            systemic disease. After reviewing the risks and  benefits, the patient was deemed in satisfactory                            condition to undergo the procedure.                           - Sedation was administered by an anesthesia                            professional. Deep sedation was attained.                           After obtaining informed consent, the colonoscope                             was passed under direct vision. Throughout the                            procedure, the patient's blood pressure, pulse, and                            oxygen saturations were monitored continuously. The                            PCF-HQ190L ([id number]) Olympus colonoscope was                            introduced through the anus and advanced to the the                            cecum, identified by appendiceal orifice and                            ileocecal valve. The colonoscopy was performed                            without difficulty. The patient tolerated the                            procedure well. The quality of the bowel                            preparation was evaluated using the BBPS [facility]                            Bowel Preparation Scale) with scores of: Right                            Colon = 2 (minor amount of residual staining, small                            fragments of stool and/or opaque liquid, but mucosa  seen well), Transverse Colon = 3 (entire mucosa                            seen well with no residual staining, small                            fragments of stool or opaque liquid) and Left Colon                            = 3 (entire mucosa seen well with no residual                            staining, small fragments of stool or opaque                            liquid). The total BBPS score equals 8. The quality                            of the bowel preparation was good. The ileocecal                            valve, appendiceal orifice, and rectum were                            photographed. Scope In: 9:32:50 AM Scope Out: 9:53:23 AM Scope Withdrawal Time: 0 hours 13 minutes 29 seconds  Total Procedure Duration: 0 hours 20 minutes 33 seconds  Findings:      An infiltrative and polypoid non-obstructing large mass was found in the       ascending colon. The mass was non-circumferential. In addition, its        diameter measured thirty mm. No bleeding was present. Biopsies were       taken with a cold forceps for histology.      Three sessile polyps were found in the transverse colon and ascending       colon. The polyps were 3 to 7 mm in size.      Scattered medium-mouthed diverticula were found in the sigmoid colon.      A large polypoid mass ws identified adjacent to the cecum in the       ascending colon. Two satellite polyps were noted in this region.       Multiple cold biopsies were obtained from the mass. Impression:               - Malignant tumor in the ascending colon. Biopsied.                           - Three 3 to 7 mm polyps in the transverse colon                            and in the ascending colon.                           - Diverticulosis in the sigmoid colon. Moderate Sedation:      Not Applicable - Patient had care  per Anesthesia. Recommendation:           - Patient has a contact number available for                            emergencies. The signs and symptoms of potential                            delayed complications were discussed with the                            patient. Return to normal activities tomorrow.                            Written discharge instructions were provided to the                            patient.                           - Resume previous diet.                           - Continue present medications.                           - Await pathology results.                           - Repeat colonoscopy in 1 year for surveillance, if                            there are no metastases. Procedure Code(s):        --- Professional ---                           [phone], Colonoscopy, flexible; with biopsy, single                            or multiple Diagnosis Code(s):        --- Professional ---                           C18.2, Malignant neoplasm of ascending colon                           D12.3, Benign neoplasm of transverse colon (hepatic                             flexure or splenic flexure)                           D12.2, Benign neoplasm of ascending colon                           R19.5, Other fecal abnormalities  D50.9, Iron deficiency anemia, unspecified                           K57.30, Diverticulosis of large intestine without                            perforation or abscess without bleeding CPT copyright 2022 American Medical Association. All rights reserved. The codes documented in this report are preliminary and upon coder review may  be revised to meet current compliance requirements. Jeani Hawking, MD Jeani Hawking, MD 03/17/2023 10:13:54 AM This report has been signed electronically. Number of Addenda: 0

## 2023-03-17 NOTE — H&P (Signed)
 Dominique Barber HPI: For the past week the patient reported issues with SOB with exertion.  She was evaluated by her cardiologist and it was determined that her blood count was abnormal.  Her blood work on 03/03/2023 showed an HGB of 7.5 g/dL with an MCV of 84.  Previously her baseline blood work was around 11-12 g/dL.  The patient has a history of constipation predominant IBS and she was treated with Linzess.  This makes her stools very loose, but for the past week she noticed some red tinge to her stools.  She was not certain if it was bleeding.  Xarelto was stopped yesterday as a result of the anemia and she reported a resolution of the mild hematocheiza.  Her last colonoscopy with Dr. Loreta Ave was on 09/07/2016 and a few SSAs were removed from the proximal colon.  The prep was rated as inadequate.  An EGD was performed on 05/08/2020 for complaints of epigastric discomfort and she was noted to have a 2 cm hiatal hernia  Past Medical History:  Diagnosis Date   ACS (acute coronary syndrome) (HCC) 01/06/2020   With progressive angina while troponin elevation--borderline-NON-STEMI   ANTERIOR STEMI of LAD (x2). 01/19/1996   Complicated by VF arrest, and anoxic brain injury with status epilepticus; POBA of LAD --> 6 months later, after 2 vessel CABG she had postop complication with occluded LIMA/second anterior STEMI   CAD in native artery & Grafts 01/1996   a) 1/'98: Ant STEMI => LAD POBA; b) 2/'98: 95% prox LAD @ POBA site --> BMS PCI (3.0 mm x 25 mm); c) 7/98 (5 months later) => BMS ISR of LAD--> CABGx2 (LIMA-LAD, SVG-D1 -> Emergent redo w/ SVG-LAD b/c acute LIMA occlusion); d) 01/2020: ACS -> DES PCI of SVG-LAD (75%&85%), TO of SVG-D1.   COPD (chronic obstructive pulmonary disease) (HCC)    GERD (gastroesophageal reflux disease)    Hiatal hernia    Hyperlipidemia    Hypothyroidism    On Synthroid   Iron deficiency anemia    Irritable bowel syndrome    Followed by Dr. Charna Elizabeth.   Ischemic  cardiomyopathy 01/03/2020   ECHO (ACS): EF 45-50% (by Cath EF 40-45%) - Apical Inferior/Apical Anteroseptal & Apex akinetic, GR II DD.    PAF (paroxysmal atrial fibrillation) (HCC) 05/03/2014   New onset - originally with RVR   PONV (postoperative nausea and vomiting)    very gag reflex   S/P CABG x 2; with EMERGENT Redo x 1. 07/1996   Initially LIMA-LAD, SVG-D1 --> immediate LIMA failure post CABG with anterior MI --> urgent redo SVG-LAD beyond D2.; Widely patent grafts as of 2005 with left dominant system. Graft to the diagonal branch has a very small target vessel but the vein graft to LAD retrograde fills a large bifurcating D2 and antegrade fills a large D3.   Tobacco abuse     Past Surgical History:  Procedure Laterality Date   APPENDECTOMY     BIOPSY  05/08/2020   Procedure: BIOPSY;  Surgeon: Jeani Hawking, MD;  Location: [facility] ENDOSCOPY;  Service: Endoscopy;;   BREAST EXCISIONAL BIOPSY Right    CESAREAN SECTION     CHOLECYSTECTOMY     CORONARY ANGIOPLASTY  01/19/1996   Acute anterior STEMI- LAD POBA:  p-m LAD 70-80% narrowing & focal 95% stenosis in distal 3rd --> Treated with Emergent PTCA( POBA)  (Dr. Jonette Eva   CORONARY ANGIOPLASTY WITH STENT PLACEMENT  02/26/1996   3.0x34mm Multi-Link stent to prox/mid LAD (stenosis at recent  PTCA site) (Dr. Jonette Eva)   CORONARY ARTERY BYPASS GRAFT  07/31/1996   x2; LIMA to LAD and SVG to 1st diagonal (Dr. Molinda Bailiff)   CORONARY ARTERY BYPASS GRAFT  07/31/1996   x1; SVG to distal LAD (Dr. Molinda Bailiff)   CORONARY STENT INTERVENTION N/A 01/08/2020   Procedure: CORONARY STENT INTERVENTION;  Surgeon: Marykay Lex, MD;  Location: [facility] INVASIVE CV LAB;; , SVG-LAD severe focal disease with 75% followed by 85% calcified stenosis--DES PCI (Resolute Onyx 3.5 mm x 38 mm - 3.6 mm)   ESOPHAGOGASTRODUODENOSCOPY (EGD) WITH PROPOFOL N/A 05/08/2020   Procedure: ESOPHAGOGASTRODUODENOSCOPY (EGD) WITH PROPOFOL;  Surgeon: Jeani Hawking, MD;  Location: [facility]  ENDOSCOPY;  Service: Endoscopy;  Laterality: N/A;   LEFT HEART CATH AND CORONARY ANGIOGRAPHY  07/30/1996   normal L main, LAD w/95% stenosis just before stent, diffuse 80-85% end-stent restenosis, 1st diagonal with70-75% ostial stenosis, large optional diagonal that was normal, Cfx with 2 marginals and distal PLA (all normal), RCA normal (Dr. Jonette Eva)   LEFT HEART CATH AND CORONARY ANGIOGRAPHY  01/19/1996   acute anterior wall MI, anoxic encephalopahty, VF; LCfx free of disease and dominant, RCA patent, L main short and patent, LAD with 70-80% narrowing and focal 95% stenosis in distal 3rd => PTCA  (Dr. Jonette Eva)   LEFT HEART CATH AND CORONARY ANGIOGRAPHY  01/18/2017   acute anterior wall MI, anoxic encephalopahty, VF; LCfx free of disease and dominant, RCA patent, L main short and patent, LAD with 70-80% narrowing and focal 95% stenosis in distal 3rd  (Dr. Jonette Eva)   LEFT HEART CATH AND CORS/GRAFTS ANGIOGRAPHY N/A 01/08/2020   Procedure: LEFT HEART CATH AND CORS/GRAFTS ANGIOGRAPHY;  Surgeon: Marykay Lex, MD;  Location: [facility] INVASIVE CV LAB; EF 40 to 45%. Mildly elevated LVEDP. Proximal LAD 100% occlusion, mid-distal LAD fills via SVG that has collaterals to D1. Normal dominant native LCx and nondominant RCA. SVG-D1 ostial 100% CTO, SVG-LAD severe focal disease with 75% followed by 85% calcified stenosis--DES PCI   LEFT HEART CATH AND CORS/GRAFTS ANGIOGRAPHY  01/31/2003   LAD totally occluded in prox 3rd, patent Cfx, SVG to mid LAd patent, SVG to small DX1 patent, LIMA to LAD was atretic and essentially occluded in junction of prox & mid 3rd, R barciocephalic and subclavian were normal (Dr. Jonette Eva)   NM [facility] PERF WALL MOTION  10/2010; 06/2014   a) LexiScan Cardiolite - mild perfusion defect r/t infarct/scar with mild periifarct ischemia in mid anterior & apical anterior region; EF 67%; abnormal, low risk study;; b) Small, moderate intensity Fixed perfusion defect - mid Anterior  Wall c/w known Anterior MI. LOW RISK    OVARIAN CYST REMOVAL     TRANSTHORACIC ECHOCARDIOGRAM  02/2012; 06/2014   a) EF 50-55%, mild HK of anterospetal myocardium; mild MR;; b) EF 55-60%, mild HK of apical anterior wall.   Mild MR   TRANSTHORACIC ECHOCARDIOGRAM  01/02/2020   EF 45 to 50%. Apical inferior, apical septal and apex akinetic. GRII DD. Otherwise normal valves, normal RV. Normal atria. Normal pressures.    Family History  Problem Relation Age of Onset   Heart failure Other    Diabetes Other    Diabetes Mother    Breast cancer Mother    Thyroid disease Paternal Aunt    Thyroid disease Maternal Grandmother     Social History:  reports that she quit smoking about 3 years ago. Her smoking use included cigarettes. She has quit using smokeless tobacco. She reports  that she does not drink alcohol and does not use drugs.  Allergies:  Allergies  Allergen Reactions   Contrast Media [Iodinated Contrast Media] Hives    red   Atorvastatin Other (See Comments)    Gas pain in abdomen with no relief   Marinol [Dronabinol]     "Made me feel high and almost pass out"   Rosuvastatin Calcium Other (See Comments)    Worsening GI upset   Simvastatin     Myalgia & GI upset   Eluxadoline Nausea And Vomiting and Other (See Comments)    Viberzi- And, made the patient feel "high"   Iohexol Hives and Rash    Red dye    Medications: Scheduled: Continuous:  sodium chloride 500 mL (03/17/23 0911)    No results found for this or any previous visit (from the past 24 hours).   No results found.  ROS:  As stated above in the HPI otherwise negative.  Blood pressure (!) 170/79, pulse 81, temperature 98.3 F (36.8 C), temperature source Temporal, resp. rate 20, height 5\' 2"  (1.575 m), weight 73.5 kg, SpO2 98%.    PE: Gen: NAD, Alert and Oriented HEENT:  [place]/AT, EOMI Neck: Supple, no LAD Lungs: CTA Bilaterally CV: RRR without M/G/R ABD: Soft, NTND, +BS Ext: No  C/C/E  Assessment/Plan: 1) IDA. 2) Heme positive stool.  Plan: 1) EGD/colonoscopy.  Shavonna Corella D 03/17/2023, 9:12 AM

## 2023-03-19 NOTE — Anesthesia Postprocedure Evaluation (Signed)
 Anesthesia Post Note  Patient: JENNIKA RINGGOLD  Procedure(s) Performed: EGD (ESOPHAGOGASTRODUODENOSCOPY) COLONOSCOPY     Patient location during evaluation: Endoscopy Anesthesia Type: MAC Level of consciousness: oriented, awake and alert and awake Pain management: pain level controlled Vital Signs Assessment: post-procedure vital signs reviewed and stable Respiratory status: spontaneous breathing, nonlabored ventilation, respiratory function stable and patient connected to nasal cannula oxygen Cardiovascular status: blood pressure returned to baseline and stable Postop Assessment: no headache, no backache and no apparent nausea or vomiting Anesthetic complications: no   No notable events documented.  Last Vitals:  Vitals:   03/17/23 1020 03/17/23 1030  BP:  (!) 166/93  Pulse: (!) 59 63  Resp: 13 19  Temp:    SpO2: 98% 99%    Last Pain:  Vitals:   03/17/23 1030  TempSrc:   PainSc: 3                  Collene Schlichter

## 2023-03-20 LAB — SURGICAL PATHOLOGY

## 2023-03-21 ENCOUNTER — Encounter: Payer: Self-pay | Admitting: Cardiology

## 2023-03-21 ENCOUNTER — Encounter (HOSPITAL_COMMUNITY): Payer: Self-pay | Admitting: Gastroenterology

## 2023-03-27 ENCOUNTER — Ambulatory Visit: Payer: Medicare Other | Admitting: Family Medicine

## 2023-03-28 ENCOUNTER — Ambulatory Visit (INDEPENDENT_AMBULATORY_CARE_PROVIDER_SITE_OTHER): Payer: Medicare Other | Admitting: Family Medicine

## 2023-03-28 ENCOUNTER — Encounter: Payer: Self-pay | Admitting: Family Medicine

## 2023-03-28 VITALS — BP 113/78 | HR 82 | Temp 98.0°F | Resp 16 | Ht 62.0 in | Wt 160.6 lb

## 2023-03-28 DIAGNOSIS — C189 Malignant neoplasm of colon, unspecified: Secondary | ICD-10-CM

## 2023-03-28 DIAGNOSIS — E034 Atrophy of thyroid (acquired): Secondary | ICD-10-CM | POA: Diagnosis not present

## 2023-03-28 DIAGNOSIS — Z720 Tobacco use: Secondary | ICD-10-CM | POA: Diagnosis not present

## 2023-03-28 DIAGNOSIS — I10 Essential (primary) hypertension: Secondary | ICD-10-CM

## 2023-03-29 ENCOUNTER — Other Ambulatory Visit (HOSPITAL_COMMUNITY)

## 2023-03-29 ENCOUNTER — Ambulatory Visit (HOSPITAL_COMMUNITY)
Admission: RE | Admit: 2023-03-29 | Discharge: 2023-03-29 | Disposition: A | Discharge status: Home / Self Care | Source: Ambulatory Visit | Attending: Gastroenterology | Admitting: Gastroenterology

## 2023-03-29 ENCOUNTER — Encounter: Payer: Self-pay | Admitting: Family Medicine

## 2023-03-29 DIAGNOSIS — C182 Malignant neoplasm of ascending colon: Secondary | ICD-10-CM | POA: Diagnosis not present

## 2023-03-29 DIAGNOSIS — R1909 Other intra-abdominal and pelvic swelling, mass and lump: Secondary | ICD-10-CM | POA: Diagnosis not present

## 2023-03-29 DIAGNOSIS — K7689 Other specified diseases of liver: Secondary | ICD-10-CM | POA: Diagnosis not present

## 2023-03-29 MED ORDER — IOHEXOL 300 MG/ML  SOLN
100.0000 mL | Freq: Once | INTRAMUSCULAR | Status: AC | PRN
  Administered 2023-03-29: 100 mL via INTRAVENOUS

## 2023-03-29 NOTE — Progress Notes (Signed)
 Established Patient Office Visit  Subjective    Patient ID: Dominique Barber, female    DOB: [birth year]  Age: 68 y.o. MRN: [id number]  CC:  Chief Complaint  Patient presents with   Follow-up    4 month    HPI Dominique Barber presents for follow up of hypertension. She also reports that she has been found to have a cancerous lesion in her colon and is to have a CT scan on tomorrow. She reports that she had been having SOB with exertion and was noted to have a HGB of 7.5 which then she had a colonoscopy.She has had a recent transfusion.   Outpatient Encounter Medications as of 03/28/2023  Medication Sig   albuterol (VENTOLIN HFA) 108 (90 Base) MCG/ACT inhaler Inhale 1-2 puffs into the lungs every 6 (six) hours as needed for wheezing or shortness of breath.   Alpha-D-Galactosidase (BEANO PO) Take 2 capsules by mouth with breakfast, with lunch, and with evening meal.   Calcium Carb-Cholecalciferol (CALCIUM+D3 PO) Take 1 tablet by mouth at bedtime.   cetirizine (ZYRTEC) 10 MG tablet TAKE 1 TABLET DAILY   Evolocumab (REPATHA SURECLICK) 140 MG/ML SOAJ Inject 140 mg into the skin every 14 (fourteen) days.   levothyroxine (SYNTHROID) 50 MCG tablet TAKE ONE AND ONE-HALF TABLETS DAILY BEFORE BREAKFAST   LINZESS 145 MCG CAPS capsule Take 145 mcg by mouth daily.   metoprolol succinate (TOPROL-XL) 25 MG 24 hr tablet TAKE 1 TABLET TWICE A DAY (DISCONTINUE 50 MG)   nitroGLYCERIN (NITROSTAT) 0.4 MG SL tablet Place 1 tablet (0.4 mg total) under the tongue every 5 (five) minutes x 3 doses as needed for chest pain.   Peppermint Oil (IBGARD PO)    polyethylene glycol powder (GLYCOLAX/MIRALAX) 17 GM/SCOOP powder Take 1 Container by mouth once.   potassium chloride (KLOR-CON) 10 MEQ tablet Take 1 tablet (10 mEq total) by mouth daily.   Probiotic Product (ALIGN) 4 MG CAPS Take 4 mg by mouth in the morning.   Simethicone 125 MG TABS Take 125 mg by mouth in the morning, at noon, in the evening, and at  bedtime.   XARELTO 20 MG TABS tablet TAKE 1 TABLET DAILY WITH SUPPER   acetaminophen (TYLENOL) 500 MG tablet Take 500 mg by mouth every 6 (six) hours as needed for mild pain.   No facility-administered encounter medications on file as of 03/28/2023.    Past Medical History:  Diagnosis Date   ACS (acute coronary syndrome) (HCC) 01/06/2020   With progressive angina while troponin elevation--borderline-NON-STEMI   ANTERIOR STEMI of LAD (x2). 01/19/1996   Complicated by VF arrest, and anoxic brain injury with status epilepticus; POBA of LAD --> 6 months later, after 2 vessel CABG she had postop complication with occluded LIMA/second anterior STEMI   CAD in native artery & Grafts 01/1996   a) 1/'98: Ant STEMI => LAD POBA; b) 2/'98: 95% prox LAD @ POBA site --> BMS PCI (3.0 mm x 25 mm); c) 7/98 (5 months later) => BMS ISR of LAD--> CABGx2 (LIMA-LAD, SVG-D1 -> Emergent redo w/ SVG-LAD b/c acute LIMA occlusion); d) 01/2020: ACS -> DES PCI of SVG-LAD (75%&85%), TO of SVG-D1.   COPD (chronic obstructive pulmonary disease) (HCC)    GERD (gastroesophageal reflux disease)    Hiatal hernia    Hyperlipidemia    Hypothyroidism    On Synthroid   Iron deficiency anemia    Irritable bowel syndrome    Followed by Dr. Charna Elizabeth.   Ischemic cardiomyopathy  01/03/2020   ECHO (ACS): EF 45-50% (by Cath EF 40-45%) - Apical Inferior/Apical Anteroseptal & Apex akinetic, GR II DD.    PAF (paroxysmal atrial fibrillation) (HCC) 05/03/2014   New onset - originally with RVR   PONV (postoperative nausea and vomiting)    very gag reflex   S/P CABG x 2; with EMERGENT Redo x 1. 07/1996   Initially LIMA-LAD, SVG-D1 --> immediate LIMA failure post CABG with anterior MI --> urgent redo SVG-LAD beyond D2.; Widely patent grafts as of 2005 with left dominant system. Graft to the diagonal branch has a very small target vessel but the vein graft to LAD retrograde fills a large bifurcating D2 and antegrade fills a large D3.    Tobacco abuse     Past Surgical History:  Procedure Laterality Date   APPENDECTOMY     BIOPSY  05/08/2020   Procedure: BIOPSY;  Surgeon: Jeani Hawking, MD;  Location: [facility];  Service: Endoscopy;;   BREAST EXCISIONAL BIOPSY Right    CESAREAN SECTION     CHOLECYSTECTOMY     COLONOSCOPY N/A 03/17/2023   Procedure: COLONOSCOPY;  Surgeon: Jeani Hawking, MD;  Location: [facility];  Service: Gastroenterology;  Laterality: N/A;  biopsy   CORONARY ANGIOPLASTY  01/19/1996   Acute anterior STEMI- LAD POBA:  p-m LAD 70-80% narrowing & focal 95% stenosis in distal 3rd --> Treated with Emergent PTCA( POBA)  (Dr. Jonette Eva   CORONARY ANGIOPLASTY WITH STENT PLACEMENT  02/26/1996   3.0x39mm Multi-Link stent to prox/mid LAD (stenosis at recent PTCA site) (Dr. Jonette Eva)   CORONARY ARTERY BYPASS GRAFT  07/31/1996   x2; LIMA to LAD and SVG to 1st diagonal (Dr. Molinda Bailiff)   CORONARY ARTERY BYPASS GRAFT  07/31/1996   x1; SVG to distal LAD (Dr. Molinda Bailiff)   CORONARY STENT INTERVENTION N/A 01/08/2020   Procedure: CORONARY STENT INTERVENTION;  Surgeon: Marykay Lex, MD;  Location: [facility];; , SVG-LAD severe focal disease with 75% followed by 85% calcified stenosis--DES PCI (Resolute Onyx 3.5 mm x 38 mm - 3.6 mm)   ESOPHAGOGASTRODUODENOSCOPY N/A 03/17/2023   Procedure: EGD (ESOPHAGOGASTRODUODENOSCOPY);  Surgeon: Jeani Hawking, MD;  Location: [facility];  Service: Gastroenterology;  Laterality: N/A;  biopsy   ESOPHAGOGASTRODUODENOSCOPY (EGD) WITH PROPOFOL N/A 05/08/2020   Procedure: ESOPHAGOGASTRODUODENOSCOPY (EGD) WITH PROPOFOL;  Surgeon: Jeani Hawking, MD;  Location: [facility];  Service: Endoscopy;  Laterality: N/A;   LEFT HEART CATH AND CORONARY ANGIOGRAPHY  07/30/1996   normal L main, LAD w/95% stenosis just before stent, diffuse 80-85% end-stent restenosis, 1st diagonal with70-75% ostial stenosis, large optional diagonal that was normal, Cfx with 2 marginals and distal PLA (all  normal), RCA normal (Dr. Jonette Eva)   LEFT HEART CATH AND CORONARY ANGIOGRAPHY  01/19/1996   acute anterior wall MI, anoxic encephalopahty, VF; LCfx free of disease and dominant, RCA patent, L main short and patent, LAD with 70-80% narrowing and focal 95% stenosis in distal 3rd => PTCA  (Dr. Jonette Eva)   LEFT HEART CATH AND CORONARY ANGIOGRAPHY  01/18/2017   acute anterior wall MI, anoxic encephalopahty, VF; LCfx free of disease and dominant, RCA patent, L main short and patent, LAD with 70-80% narrowing and focal 95% stenosis in distal 3rd  (Dr. Jonette Eva)   LEFT HEART CATH AND CORS/GRAFTS ANGIOGRAPHY N/A 01/08/2020   Procedure: LEFT HEART CATH AND CORS/GRAFTS ANGIOGRAPHY;  Surgeon: Marykay Lex, MD;  Location: [facility]; EF 40 to 45%. Mildly elevated LVEDP. Proximal LAD  100% occlusion, mid-distal LAD fills via SVG that has collaterals to D1. Normal dominant native LCx and nondominant RCA. SVG-D1 ostial 100% CTO, SVG-LAD severe focal disease with 75% followed by 85% calcified stenosis--DES PCI   LEFT HEART CATH AND CORS/GRAFTS ANGIOGRAPHY  01/31/2003   LAD totally occluded in prox 3rd, patent Cfx, SVG to mid LAd patent, SVG to small DX1 patent, LIMA to LAD was atretic and essentially occluded in junction of prox & mid 3rd, R barciocephalic and subclavian were normal (Dr. Jonette Eva)   [facility] PERF WALL MOTION  10/2010; 06/2014   a) LexiScan Cardiolite - mild perfusion defect r/t infarct/scar with mild periifarct ischemia in mid anterior & apical anterior region; EF 67%; abnormal, low risk study;; b) Small, moderate intensity Fixed perfusion defect - mid Anterior Wall c/w known Anterior MI. LOW RISK    OVARIAN CYST REMOVAL     TRANSTHORACIC ECHOCARDIOGRAM  02/2012; 06/2014   a) EF 50-55%, mild HK of anterospetal myocardium; mild MR;; b) EF 55-60%, mild HK of apical anterior wall.   Mild MR   TRANSTHORACIC ECHOCARDIOGRAM  01/02/2020   EF 45 to 50%. Apical inferior, apical septal  and apex akinetic. GRII DD. Otherwise normal valves, normal RV. Normal atria. Normal pressures.    Family History  Problem Relation Age of Onset   Heart failure Other    Diabetes Other    Diabetes Mother    Breast cancer Mother    Thyroid disease Paternal Aunt    Thyroid disease Maternal Grandmother     Social History   Socioeconomic History   Marital status: Married    Spouse name: Not on file   Number of children: 3   Years of education: Not on file   Highest education level: 12th grade  Occupational History    Employer: DISABLED  Tobacco Use   Smoking status: Former    Current packs/day: 0.00    Types: Cigarettes    Quit date: 01/04/2020    Years since quitting: 3.2   Smokeless tobacco: Former  Building services engineer status: Never Used  Substance and Sexual Activity   Alcohol use: No    Alcohol/week: 0.0 standard drinks of alcohol   Drug use: Never   Sexual activity: Not on file  Other Topics Concern   Not on file  Social History Narrative   Married, mother of 3 children (2 living sons age 36-26 and 30-31; her daughter was the victim of a murder that took place sometime around the time of the patient's MI. She was under significant amount of stress as her daughter had gone missing. She was never found. Finally the suspect was charged and convicted in 2009. She had sessile he stop smoking until that time frame when it brought back memories and she is now back to smoking a half pack a day.She does not get routine exercise, but is active around the house and doing housecleaning chores.She does not drink alcohol.         Family history: mgm died at 26, several maternal aunts and uncles all died in their 29's as well; mother didn't have any heart issues, had dementia, DM, died in her 14's; father lung cancer - smoker died at 15; 1 brother w/o heart issues; 2 living children - one with seizure disorder, no cholesterol issues known       Diet: doesn't eat much; has problems with  GI/bloating since bypass; maybe 1-1.5 meals per day; oatmeal in the monings; sandwiches/soups in  small amounts in the day; IBS issues more constipation; linzess, simethicone, maalox       Exercise:  housework   Social Drivers of Corporate investment banker Strain: Low Risk  (11/24/2022)   Overall Financial Resource Strain (CARDIA)    Difficulty of Paying Living Expenses: Not very hard  Food Insecurity: No Food Insecurity (11/24/2022)   Hunger Vital Sign    Worried About Running Out of Food in the Last Year: Never true    Ran Out of Food in the Last Year: Never true  Transportation Needs: No Transportation Needs (11/24/2022)   PRAPARE - Administrator, Civil Service (Medical): No    Lack of Transportation (Non-Medical): No  Physical Activity: Insufficiently Active (11/24/2022)   Exercise Vital Sign    Days of Exercise per Week: 3 days    Minutes of Exercise per Session: 20 min  Stress: No Stress Concern Present (11/24/2022)   [employer] of Occupational Health - Occupational Stress Questionnaire    Feeling of Stress : Not at all  Social Connections: Unknown (11/24/2022)   Social Connection and Isolation Panel [NHANES]    Frequency of Communication with Friends and Family: Twice a week    Frequency of Social Gatherings with Friends and Family: Patient declined    Attends Religious Services: Patient declined    Database administrator or Organizations: No    Attends Engineer, structural: Not on file    Marital Status: Married  Catering manager Violence: Not on file    Review of Systems  All other systems reviewed and are negative.       Objective    BP 113/78   Pulse 82   Temp 98 F (36.7 C) (Oral)   Resp 16   Ht 5\' 2"  (1.575 m)   Wt 160 lb 9.6 oz (72.8 kg)   SpO2 95%   BMI 29.37 kg/m   Physical Exam Vitals and nursing note reviewed.  Constitutional:      General: She is not in acute distress. Cardiovascular:     Rate and Rhythm:  Normal rate and regular rhythm.  Pulmonary:     Effort: Pulmonary effort is normal.     Breath sounds: Normal breath sounds.  Abdominal:     Palpations: Abdomen is soft.     Tenderness: There is no abdominal tenderness.  Neurological:     General: No focal deficit present.     Mental Status: She is alert and oriented to person, place, and time.         Assessment & Plan:    1. Essential hypertension (Primary) Appears stable; continue   2. Malignant neoplasm of colon, unspecified part of colon (HCC) Management as per consultant  3. Hypothyroidism due to acquired atrophy of thyroid Continue   4. Tobacco abuse Discussed reduction/cessation   Return in about 3 months (around 06/28/2023).   Tommie Raymond, MD

## 2023-03-31 ENCOUNTER — Ambulatory Visit (HOSPITAL_COMMUNITY)
Admission: RE | Admit: 2023-03-31 | Discharge: 2023-03-31 | Disposition: A | Discharge status: Home / Self Care | Payer: Medicare Other | Source: Ambulatory Visit | Attending: Cardiology | Admitting: Cardiology

## 2023-03-31 DIAGNOSIS — Z951 Presence of aortocoronary bypass graft: Secondary | ICD-10-CM | POA: Insufficient documentation

## 2023-03-31 DIAGNOSIS — I25710 Atherosclerosis of autologous vein coronary artery bypass graft(s) with unstable angina pectoris: Secondary | ICD-10-CM | POA: Diagnosis not present

## 2023-03-31 DIAGNOSIS — R0609 Other forms of dyspnea: Secondary | ICD-10-CM | POA: Insufficient documentation

## 2023-03-31 DIAGNOSIS — I480 Paroxysmal atrial fibrillation: Secondary | ICD-10-CM | POA: Diagnosis not present

## 2023-03-31 LAB — ECHOCARDIOGRAM COMPLETE
AR max vel: 1.7 cm2
AV Area VTI: 1.56 cm2
AV Area mean vel: 1.59 cm2
AV Mean grad: 4 mmHg
AV Peak grad: 7.1 mmHg
Ao pk vel: 1.33 m/s
Area-P 1/2: 3.99 cm2
MV M vel: 1.21 m/s
MV Peak grad: 5.9 mmHg
S' Lateral: 2.35 cm

## 2023-04-11 ENCOUNTER — Encounter: Payer: Self-pay | Admitting: Cardiology

## 2023-04-11 ENCOUNTER — Other Ambulatory Visit (HOSPITAL_COMMUNITY): Payer: Self-pay | Admitting: Gastroenterology

## 2023-04-11 ENCOUNTER — Ambulatory Visit: Payer: Medicare Other | Attending: Cardiology | Admitting: Cardiology

## 2023-04-11 VITALS — BP 122/80 | HR 64 | Ht 62.0 in | Wt 161.2 lb

## 2023-04-11 DIAGNOSIS — Z720 Tobacco use: Secondary | ICD-10-CM | POA: Diagnosis not present

## 2023-04-11 DIAGNOSIS — E785 Hyperlipidemia, unspecified: Secondary | ICD-10-CM | POA: Diagnosis not present

## 2023-04-11 DIAGNOSIS — I480 Paroxysmal atrial fibrillation: Secondary | ICD-10-CM | POA: Insufficient documentation

## 2023-04-11 DIAGNOSIS — I2510 Atherosclerotic heart disease of native coronary artery without angina pectoris: Secondary | ICD-10-CM | POA: Diagnosis not present

## 2023-04-11 DIAGNOSIS — M791 Myalgia, unspecified site: Secondary | ICD-10-CM | POA: Diagnosis not present

## 2023-04-11 DIAGNOSIS — C180 Malignant neoplasm of cecum: Secondary | ICD-10-CM | POA: Insufficient documentation

## 2023-04-11 DIAGNOSIS — Z01810 Encounter for preprocedural cardiovascular examination: Secondary | ICD-10-CM | POA: Diagnosis not present

## 2023-04-11 DIAGNOSIS — E876 Hypokalemia: Secondary | ICD-10-CM | POA: Diagnosis not present

## 2023-04-11 DIAGNOSIS — T466X5A Adverse effect of antihyperlipidemic and antiarteriosclerotic drugs, initial encounter: Secondary | ICD-10-CM | POA: Diagnosis not present

## 2023-04-11 DIAGNOSIS — I2102 ST elevation (STEMI) myocardial infarction involving left anterior descending coronary artery: Secondary | ICD-10-CM | POA: Diagnosis not present

## 2023-04-11 DIAGNOSIS — K582 Mixed irritable bowel syndrome: Secondary | ICD-10-CM | POA: Insufficient documentation

## 2023-04-11 DIAGNOSIS — D6869 Other thrombophilia: Secondary | ICD-10-CM | POA: Insufficient documentation

## 2023-04-11 DIAGNOSIS — R9389 Abnormal findings on diagnostic imaging of other specified body structures: Secondary | ICD-10-CM

## 2023-04-11 MED ORDER — POTASSIUM CHLORIDE CRYS ER 20 MEQ PO TBCR: 20.0000 meq | EXTENDED_RELEASE_TABLET | Freq: Every day | ORAL | 3 refills | Status: DC

## 2023-04-11 NOTE — Patient Instructions (Addendum)
 Medication Instructions:   Not  changes *If you need a refill on your cardiac medications before your next appointment, please call your pharmacy*  Other Instructions   Okay to have surgery,recommend taking 400 mg Amiodarone 7 days prior to surgery instead of taking Metoprolol Succinate. Please have  surgeon scheduler contact office to get an official cardiac clearance   Lab Work: Not needed If you have labs (blood work) drawn today and your tests are completely normal, you will receive your results only by: MyChart Message (if you have MyChart) OR A paper copy in the mail If you have any lab test that is abnormal or we need to change your treatment, we will call you to review the results.   Testing/Procedures: Not needed   Follow-Up: At [facility], you and your health needs are our priority.  As part of our continuing mission to provide you with exceptional heart care, we have created designated Provider Care Teams.  These Care Teams include your primary Cardiologist (physician) and Advanced Practice Providers (APPs -  Physician Assistants and Nurse Practitioners) who all work together to provide you with the care you need, when you need it.     Your next appointment:    6 month(s)  The format for your next appointment:   In Person  Provider:  Bryan Lemma, MD                                      Other Instructions   Okay to have surgery,recommend taking 400 mg Amiodarone 7 days prior to surgery instead of taking Metoprolol Succinate.

## 2023-04-11 NOTE — Progress Notes (Unsigned)
 Cardiology Office Note:  .   Date:  04/12/2023  ID:  Dominique Barber, DOB 1955/10/12, MRN [id number] PCP: Georganna Skeans, MD  [facility] Providers Cardiologist:  Bryan Lemma, MD     Chief Complaint  Patient presents with   Follow-up    6-week follow-up after colonoscopy.   Coronary Artery Disease    Thankfully, no angina   Atrial Fibrillation    DOAC currently on hold because of lower GI bleed related to newly diagnosed colon adenocarcinoma    Patient Profile: .     Dominique Barber is a borderline obese, chronically ill 68 y.o. female with a PMH below who presents here for 75-month follow-up at the request of Georganna Skeans, MD.  Dominique Barber is a 68 y.o. female with a PMH reviewed below who presents here for annual follow-up at the request of Georganna Skeans, MD.  Pertinent Past Medical History: CAD: STEMI 1998 w/ VR Arrest & ~ anoxic brain injury -> status epilepticus -> POBA-LAD --> 1 month later returned with [place] --> 95% re-stenosis --> BMS-LAD (Dr. Alanda Amass) 7/'98 Recurrent Angina -> severe ISR --> CABG x 2 (LIMA-LAD, SVG-D1) => emergent re-operation post-OP --> LIMA occlusion --> SVG-LAD. Jan 2005: Cath -> patent grafts & normal Dom LCx, non-dome RCA. Small D1). Jan 02, 2020: Admitted with Afib-RVR & Demand Ischemia (+ Trop & lat TWI) - planned OP Myoview Jan 04 2020 - ER with CP (left after 12 hr & not seen) --> EMS came out on Jan 2, EKG no longer had TWI.  Jan 06, 2020 -> returned to ER with recurrent ACS Sx --> Cath Jan 08, 2020 Cath-PCI SVG-LAD, SVG-Diag CTO. Stable native Dom LCx & non-dome RCA PAF -Most recent episode Dec 2021 -> converted with IV Diltiazem; on Xarelto CRFs: HTN, HLD;  COPD, GERD,  => statin intolerant- on Repatha ** IBS has been flaring up since Dec 2021 (mostly notes epigastric pain, bloating, early satiety & nausea    I last saw Dominique Barber in October 20204: Noted infrequent episodes of irregular heartbeat Xarelto  short-lived, not sure if they were to A-fib or not.  No PND orthopnea just exacerbating her abdominal illness with laying flat.  No recurrence of anginal chest discomfort or edema.  No syncope or near syncope.  Was dealing with carpal tunnel issues.  The only notable cardiac symptom was some palpitations but nothing prolonged.  She had started on Repatha and tolerating well.  We checked labs and continue medications.  Plan was for annual follow-up.  Unfortunately, she was seen by Dr. [place] on 02/28/2023 -she noticing worsening exertional dyspnea with any apiculate activity but no chest pain.  Apparently she went into A-fib (confirmed by [facility]) on the previous weekend and noted much more notable dyspnea.  Thankfully, this only lasted 30 minutes to an hour..  Echo checked along with labs.  She was found to be profoundly anemic (hemoglobin 7.5) Xarelto held,--> sent for C-Scope with Dr. Elnoria Howard.  =>    Subjective  Discussed the use of AI scribe software for clinical note transcription with the patient, who gave verbal consent to proceed.  History of Present Illness History of Present Illness The patient with a history of CAD (CABG and PCI), A-fib, and IBS presents with shortness of breath and recent internal bleeding due to a colon tumor.  Approximately two to three weeks ago, her hemoglobin dropped to 7 g/dL, necessitating a blood transfusion. Following the transfusion, a colonoscopy revealed a 3  cm cancerous tumor in the colon. A CT scan identified a 13 mm lesion on the liver, which was poorly evaluated, prompting the need for an MRI scheduled for Thursday.  She experiences shortness of breath, particularly when standing-although it is most significant improved since her blood transfusions.. No significant anginal chest pain, heart racing, or dizziness. Occasional sharp chest pain and difficulty swallowing are noted.  No PND orthopnea.  No leg swelling except at night after prolonged standing,  which resolves by morning.  She has a history of atrial fibrillation and was previously on Xarelto but has been taken off it temporarily due to the internal bleeding. She is not currently on aspirin or Plavix. Her current medications include Metoprolol 25 mg and Repatha.  As far she can tell us, she does not necessarily have any prolonged episodes that would suggest A-fib.  She does have some short fleeting episodes of rapid irregular heartbeats but not lasting more than 5 minutes or so.  In general her episodes do not usually last too much more than 30 minutes to an hour.  She denies any syncope or near syncope, TIA no TIA or amaurosis fugax.  No claudication.  She experiences symptoms consistent with irritable bowel syndrome, such as gas and bloating after eating. She has been using Miralax, simethicone, Ibogard, and Benadryl for gastrointestinal symptoms. She has stopped taking Linzess as it did not significantly alleviate her symptoms and is awaiting approval for its use. She has also started drinking fennel tea, which she finds helps with her stomach discomfort at night.  Her potassium levels have stabilized since being low in 2022. She was initially taking two pills a day but has reduced to one pill a day.    Objective    Studies Reviewed: Marland Kitchen        [facility] 03/31/23: Normal LV size and function with EF 55 to 60%.  No RWMA.  Elevated LAP with abnormal relaxation.  Normal RV size and function but unable to assess PAP.  Normal valves with normal  EGD/Colonoscopy 03/17/2023: Infiltrative polypoid nonobstructing large mass noted in the ascending colon measured 30 mm.  Biopsies taken for histology.  Likely malignant tumor the ascending aorta also three 3-7 mm polyps in the transverse colon and ascending colon along with sigmoid diverticulitis.;  Benign-appearing esophageal stenosis erosive gastropathy with no stigmata of recent.  Biopsied.  CT Chest-Abdomen/Pelvis: Subcentimeter hepatic cysts as well as  a 13 mm hypoenhancing subcapsular lesion in segment 4.  Normal kidneys suspected 3 cm lateral cecal mass likely corresponding with known adenocarcinoma.  Lab Results  Component Value Date   CHOL 125 11/28/2022   HDL 63 11/28/2022   LDLCALC 41 11/28/2022   TRIG 123 11/28/2022   CHOLHDL 2.0 11/28/2022   Lab Results  Component Value Date   NA 141 03/03/2023   K 4.6 03/03/2023   CREATININE 1.11 (H) 03/03/2023   EGFR 54 (L) 03/03/2023   GLUCOSE 116 (H) 03/03/2023      Latest Ref Rng & Units 03/17/2023   10:23 AM 03/03/2023    3:38 PM 01/22/2022    3:13 PM  CBC  WBC 4.0 - 10.5 K/uL 7.8  7.7  4.7   Hemoglobin 12.0 - 15.0 g/dL 40.9  7.5  81.1   Hematocrit 36.0 - 46.0 % 35.4  23.4  36.9   Platelets 150 - 400 K/uL 283  406  223    Risk Assessment/Calculations:    CHA2DS2-VASc Score = 4   This indicates a 4.8%  annual risk of stroke. The patient's score is based upon: CHF History: 0 HTN History: 1 Diabetes History: 0 Stroke History: 0 Vascular Disease History: 1 Age Score: 1 Gender Score: 1   Temporarily off Xarelto 2/2 bleeding Cecal Mass.          Physical Exam:   VS:  BP 122/80 (BP Location: Left Arm, Patient Position: Sitting, Cuff Size: Normal)   Pulse 64   Ht 5\' 2"  (1.575 m)   Wt 161 lb 3.2 oz (73.1 kg)   SpO2 97%   BMI 29.48 kg/m    Wt Readings from Last 3 Encounters:  04/11/23 161 lb 3.2 oz (73.1 kg)  03/28/23 160 lb 9.6 oz (72.8 kg)  03/17/23 162 lb (73.5 kg)    GEN: Well nourished, well developed in no acute distress; borderline obese with mostly truncal obesity. NECK: No JVD; No carotid bruits CARDIAC:  RRR, with ectopy.  Normal S1, S2;no murmurs, rubs, gallops RESPIRATORY:  Clear to auscultation without rales, wheezing or rhonchi ; nonlabored, good air movement. ABDOMEN: Soft, non-tender, non-distended: Increased bowel sounds EXTREMITIES:  No edema; No deformity      ASSESSMENT AND PLAN: .    Problem List Items Addressed This Visit       Cardiology  Problems   History of ST elevation (STEMI) myocardial infarction involving left anterior descending coronary artery (HCC) (Chronic)   Distant history of anterior STEMI.  No active angina or heart failure symptoms.  Ongoing GI issues and other comorbidities along with borderline pressures have limited GDMT-only on low-dose beta-blocker and Repatha. Intolerant to statins with myalgias and memory issues.  Not on aspirin or Plavix because of need for longstanding DOAC which is currently on hold.      Hypercoagulable state due to paroxysmal atrial fibrillation (HCC) (Chronic)   CHA2DS2-VASc score is 4.  However currently Xarelto on hold due to GI bleeding issues.  Defer timing of when to restart Xarelto to GI medicine      Hyperlipidemia with target low density lipoprotein (LDL) cholesterol less than 55 mg/dL (Chronic)   Lipids from November show excellent results on Repatha with an LDL of 41. -Continue Repatha 140 mg injections every 14 days. -Not on statin due to myalgias and memory issues.      Native CAD- 100% LAD, s/p SVG-LAD after failed LIMA-LAD - Primary (Chronic)   She has been pretty stable since her vein graft intervention with no recurrent anginal type chest pain.  She has lots of epigastric abdominal pain since musculoskeletal type pains but she had a pretty significant squeezing type anginal pain prior to her most recent PCI. No longer on aspirin or Plavix because of being on DOAC for A-fib-DOAC currently on hold because of GI bleed from colon cancer Continue Toprol 25 mg daily and Repatha.      Paroxysmal atrial fibrillation (HCC); CHA2DS2-VASc Score 3. On Xarelto (Chronic)   Thankfully, her breakthrough spells are usually relatively very short.  Heart rates seem to be controlled otherwise and were trying to avoid additional medications such as AAD's.  She is only on 25 mg Toprol and doing well. Currently Xarelto is on hold until the results of her MRI are complete and hemoglobin  levels are stable.  Will defer to GI as to when this can safely be restarted.  At risk of AFib during surgery. Prophylactic amiodarone planned. - Prescribe amiodarone 400 mg daily for one week preoperatively and 200 mg daily postoperatively if surgery is scheduled.  Relevant Orders   EKG 12-Lead (Completed)     Other   Adenocarcinoma of cecum (HCC)   Newly diagnosed colonic adenocarcinoma with plans for MRI evaluation.    3 cm malignant tumor causing internal bleeding and hemoglobin drop. Biopsied but not removed. Pending surgical intervention after liver lesion evaluation. Cardiac function stable, tolerated low hemoglobin without angina or heart failure. - Order MRI to evaluate liver lesion. 13 mm lesion poorly evaluated on CT. Differential includes metastasis, impacting treatment decisions. MRI scheduled for better characterization. - Consult GI and surgical teams for potential laparoscopic resection. - Discuss surgical risks and benefits, emphasizing lower risk of laparoscopic surgery.       Hypokalemia (Chronic)   Has been taking longstanding potassium supplementation dating back to when she was on Lasix. Stable potassium levels since 2022. Previous hypokalemia normalized. Reduction in supplementation possible. - Reduce potassium supplementation to one pill daily. - Monitor potassium levels.      IBS (irritable bowel syndrome)   Experiences gas, managed with Miralax and simethicone. Linzess under review, stopped due to lack of constipation. - Continue Miralax and simethicone as needed. - Discuss Linzess use with GI specialist.      Myalgia due to statin (Chronic)   Has been intolerant of multiple different statins including atorvastatin, rosuvastatin and simvastatin.  Has had both myalgias and memory issues. Lipids not well-controlled with Repatha.      Preop cardiovascular exam    Ms. Gasbarro's perioperative risk of a major cardiac event is 0.4% according to the  Revised Cardiac Risk Index (RCRI).  Therefore, she is at low risk for perioperative complications.   Her functional capacity is good at 7.99 METs according to the Duke Activity Status Index (DASI).  -> She does have a history of CAD but has been revascularized with no active symptoms.  Normal EF with no active heart failure.  She does have intermittent A-fib-low threshold for perioperative use of amiodarone. => In the absence of ongoing angina or heart failure symptoms, would not recommend ischemic evaluation as it would not change therapy.  Recommendations: According to ACC/AHA guidelines, no further cardiovascular testing needed.  The patient may proceed to surgery at acceptable risk.   Antiplatelet and/or Anticoagulation Recommendations: Not on antiplatelet Xarelto (Rivaroxaban) can be held for 2 to 3  days prior to surgery.  Please resume post op when felt to be safe.  Xarelto currently on hold because of GI bleeding from adenocarcinoma, will plan to hold until decision made for surgery.  Would likely hold off until postop.  Record and restarting 1 to 2 days postop once stable from postop standpoint.  At risk of AFib during surgery. Prophylactic amiodarone planned. - Prescribe amiodarone 400 mg daily for one week preoperatively and 200 mg daily postoperatively if surgery is scheduled.        Tobacco abuse (Chronic)   Smoking very few cigarettes.  Trying to fully quit.       Follow-Up: Return in about 6 months (around 10/11/2023) for Post-cath Followup with me, [employer].   Total time spent: 28 min spent with patient + 29 min spent charting = I spent 57 minutes in the care of Dominique Barber today including reviewing labs (2 min), reviewing studies (echocardiogram, colonoscopy, and endoscopy and CT scan all reviewed-7 minutes), face to face time discussing treatment options (28 minutes), reviewing records from clinic note with Dr. [place] and telephone call  notes as well as prior history (5 minutes), 15 minutes  dictating, and documenting in the encounter.      Signed, Marykay Lex, MD, MS Bryan Lemma, M.D., M.S. Interventional Cardiologist  [facility]  Pager # [phone] Phone # [phone] [address] [place], [place] [address]

## 2023-04-12 ENCOUNTER — Encounter: Payer: Self-pay | Admitting: Cardiology

## 2023-04-12 DIAGNOSIS — K589 Irritable bowel syndrome without diarrhea: Secondary | ICD-10-CM | POA: Insufficient documentation

## 2023-04-12 DIAGNOSIS — Z01810 Encounter for preprocedural cardiovascular examination: Secondary | ICD-10-CM | POA: Insufficient documentation

## 2023-04-12 DIAGNOSIS — M791 Myalgia, unspecified site: Secondary | ICD-10-CM | POA: Insufficient documentation

## 2023-04-12 DIAGNOSIS — E876 Hypokalemia: Secondary | ICD-10-CM | POA: Insufficient documentation

## 2023-04-12 DIAGNOSIS — C180 Malignant neoplasm of cecum: Secondary | ICD-10-CM | POA: Insufficient documentation

## 2023-04-12 NOTE — Assessment & Plan Note (Signed)
 Smoking very few cigarettes.  Trying to fully quit.

## 2023-04-12 NOTE — Assessment & Plan Note (Addendum)
 Thankfully, her breakthrough spells are usually relatively very short.  Heart rates seem to be controlled otherwise and were trying to avoid additional medications such as AAD's.  She is only on 25 mg Toprol and doing well. Currently Xarelto is on hold until the results of her MRI are complete and hemoglobin levels are stable.  Will defer to GI as to when this can safely be restarted.  At risk of AFib during surgery. Prophylactic amiodarone planned. - Prescribe amiodarone 400 mg daily for one week preoperatively and 200 mg daily postoperatively if surgery is scheduled.

## 2023-04-12 NOTE — Assessment & Plan Note (Signed)
 Has been intolerant of multiple different statins including atorvastatin, rosuvastatin and simvastatin.  Has had both myalgias and memory issues. Lipids not well-controlled with Repatha.

## 2023-04-12 NOTE — Assessment & Plan Note (Addendum)
 Newly diagnosed colonic adenocarcinoma with plans for MRI evaluation.    3 cm malignant tumor causing internal bleeding and hemoglobin drop. Biopsied but not removed. Pending surgical intervention after liver lesion evaluation. Cardiac function stable, tolerated low hemoglobin without angina or heart failure. - Order MRI to evaluate liver lesion. 13 mm lesion poorly evaluated on CT. Differential includes metastasis, impacting treatment decisions. MRI scheduled for better characterization. - Consult GI and surgical teams for potential laparoscopic resection. - Discuss surgical risks and benefits, emphasizing lower risk of laparoscopic surgery.

## 2023-04-12 NOTE — Assessment & Plan Note (Signed)
 She has been pretty stable since her vein graft intervention with no recurrent anginal type chest pain.  She has lots of epigastric abdominal pain since musculoskeletal type pains but she had a pretty significant squeezing type anginal pain prior to her most recent PCI. No longer on aspirin or Plavix because of being on DOAC for A-fib-DOAC currently on hold because of GI bleed from colon cancer Continue Toprol 25 mg daily and Repatha.

## 2023-04-12 NOTE — Assessment & Plan Note (Addendum)
  Dominique Barber's perioperative risk of a major cardiac event is 0.4% according to the Revised Cardiac Risk Index (RCRI).  Therefore, she is at low risk for perioperative complications.   Her functional capacity is good at 7.99 METs according to the Duke Activity Status Index (DASI).  -> She does have a history of CAD but has been revascularized with no active symptoms.  Normal EF with no active heart failure.  She does have intermittent A-fib-low threshold for perioperative use of amiodarone. => In the absence of ongoing angina or heart failure symptoms, would not recommend ischemic evaluation as it would not change therapy.  Recommendations: According to ACC/AHA guidelines, no further cardiovascular testing needed.  The patient may proceed to surgery at acceptable risk.   Antiplatelet and/or Anticoagulation Recommendations: Not on antiplatelet Xarelto (Rivaroxaban) can be held for 2 to 3  days prior to surgery.  Please resume post op when felt to be safe.  Xarelto currently on hold because of GI bleeding from adenocarcinoma, will plan to hold until decision made for surgery.  Would likely hold off until postop.  Record and restarting 1 to 2 days postop once stable from postop standpoint.  At risk of AFib during surgery. Prophylactic amiodarone planned. - Prescribe amiodarone 400 mg daily for one week preoperatively and 200 mg daily postoperatively if surgery is scheduled.

## 2023-04-12 NOTE — Assessment & Plan Note (Signed)
 Experiences gas, managed with Miralax and simethicone. Linzess under review, stopped due to lack of constipation. - Continue Miralax and simethicone as needed. - Discuss Linzess use with GI specialist.

## 2023-04-12 NOTE — Assessment & Plan Note (Signed)
 Lipids from November show excellent results on Repatha with an LDL of 41. -Continue Repatha 140 mg injections every 14 days. -Not on statin due to myalgias and memory issues.

## 2023-04-12 NOTE — Assessment & Plan Note (Signed)
 CHA2DS2-VASc score is 4.  However currently Xarelto on hold due to GI bleeding issues.  Defer timing of when to restart Xarelto to GI medicine

## 2023-04-12 NOTE — Assessment & Plan Note (Signed)
 Has been taking longstanding potassium supplementation dating back to when she was on Lasix. Stable potassium levels since 2022. Previous hypokalemia normalized. Reduction in supplementation possible. - Reduce potassium supplementation to one pill daily. - Monitor potassium levels.

## 2023-04-12 NOTE — Assessment & Plan Note (Signed)
 Distant history of anterior STEMI.  No active angina or heart failure symptoms.  Ongoing GI issues and other comorbidities along with borderline pressures have limited GDMT-only on low-dose beta-blocker and Repatha. Intolerant to statins with myalgias and memory issues.  Not on aspirin or Plavix because of need for longstanding DOAC which is currently on hold.

## 2023-04-13 ENCOUNTER — Ambulatory Visit (HOSPITAL_COMMUNITY)
Admission: RE | Admit: 2023-04-13 | Discharge: 2023-04-13 | Disposition: A | Discharge status: Home / Self Care | Source: Ambulatory Visit | Attending: Gastroenterology | Admitting: Gastroenterology

## 2023-04-13 DIAGNOSIS — N281 Cyst of kidney, acquired: Secondary | ICD-10-CM | POA: Diagnosis not present

## 2023-04-13 DIAGNOSIS — R9389 Abnormal findings on diagnostic imaging of other specified body structures: Secondary | ICD-10-CM | POA: Insufficient documentation

## 2023-04-13 DIAGNOSIS — Z9049 Acquired absence of other specified parts of digestive tract: Secondary | ICD-10-CM | POA: Diagnosis not present

## 2023-04-13 DIAGNOSIS — I700 Atherosclerosis of aorta: Secondary | ICD-10-CM | POA: Diagnosis not present

## 2023-04-13 DIAGNOSIS — C189 Malignant neoplasm of colon, unspecified: Secondary | ICD-10-CM | POA: Diagnosis not present

## 2023-04-13 MED ORDER — GADOBUTROL 1 MMOL/ML IV SOLN
7.0000 mL | Freq: Once | INTRAVENOUS | Status: AC | PRN
  Administered 2023-04-13: 7 mL via INTRAVENOUS

## 2023-04-20 DIAGNOSIS — C182 Malignant neoplasm of ascending colon: Secondary | ICD-10-CM | POA: Diagnosis not present

## 2023-05-03 ENCOUNTER — Ambulatory Visit: Payer: Self-pay | Admitting: Surgery

## 2023-05-03 ENCOUNTER — Encounter: Payer: Self-pay | Admitting: Cardiology

## 2023-05-03 DIAGNOSIS — Z01818 Encounter for other preprocedural examination: Secondary | ICD-10-CM

## 2023-05-03 DIAGNOSIS — C182 Malignant neoplasm of ascending colon: Secondary | ICD-10-CM

## 2023-05-03 DIAGNOSIS — C180 Malignant neoplasm of cecum: Secondary | ICD-10-CM | POA: Diagnosis not present

## 2023-05-07 ENCOUNTER — Emergency Department (HOSPITAL_COMMUNITY)
Admission: EM | Admit: 2023-05-07 | Discharge: 2023-05-07 | Disposition: A | Discharge status: Home / Self Care | Attending: Emergency Medicine | Admitting: Emergency Medicine

## 2023-05-07 ENCOUNTER — Encounter (HOSPITAL_COMMUNITY): Payer: Self-pay | Admitting: Emergency Medicine

## 2023-05-07 ENCOUNTER — Emergency Department (HOSPITAL_COMMUNITY)

## 2023-05-07 ENCOUNTER — Other Ambulatory Visit: Payer: Self-pay

## 2023-05-07 DIAGNOSIS — M7989 Other specified soft tissue disorders: Secondary | ICD-10-CM | POA: Diagnosis not present

## 2023-05-07 DIAGNOSIS — M79605 Pain in left leg: Secondary | ICD-10-CM | POA: Diagnosis not present

## 2023-05-07 DIAGNOSIS — R202 Paresthesia of skin: Secondary | ICD-10-CM | POA: Diagnosis not present

## 2023-05-07 DIAGNOSIS — I4891 Unspecified atrial fibrillation: Secondary | ICD-10-CM | POA: Insufficient documentation

## 2023-05-07 DIAGNOSIS — M5432 Sciatica, left side: Secondary | ICD-10-CM

## 2023-05-07 DIAGNOSIS — M79606 Pain in leg, unspecified: Secondary | ICD-10-CM | POA: Diagnosis not present

## 2023-05-07 DIAGNOSIS — Z7901 Long term (current) use of anticoagulants: Secondary | ICD-10-CM | POA: Diagnosis not present

## 2023-05-07 DIAGNOSIS — R200 Anesthesia of skin: Secondary | ICD-10-CM | POA: Diagnosis not present

## 2023-05-07 DIAGNOSIS — I10 Essential (primary) hypertension: Secondary | ICD-10-CM | POA: Diagnosis not present

## 2023-05-07 DIAGNOSIS — M5442 Lumbago with sciatica, left side: Secondary | ICD-10-CM | POA: Diagnosis not present

## 2023-05-07 LAB — CBC WITH DIFFERENTIAL/PLATELET
Abs Immature Granulocytes: 0.04 10*3/uL (ref 0.00–0.07)
Basophils Absolute: 0.1 10*3/uL (ref 0.0–0.1)
Basophils Relative: 1 %
Eosinophils Absolute: 0.1 10*3/uL (ref 0.0–0.5)
Eosinophils Relative: 2 %
HCT: 36.1 % (ref 36.0–46.0)
Hemoglobin: 11.2 g/dL — ABNORMAL LOW (ref 12.0–15.0)
Immature Granulocytes: 1 %
Lymphocytes Relative: 16 %
Lymphs Abs: 1.2 10*3/uL (ref 0.7–4.0)
MCH: 27 pg (ref 26.0–34.0)
MCHC: 31 g/dL (ref 30.0–36.0)
MCV: 87 fL (ref 80.0–100.0)
Monocytes Absolute: 0.4 10*3/uL (ref 0.1–1.0)
Monocytes Relative: 5 %
Neutro Abs: 5.9 10*3/uL (ref 1.7–7.7)
Neutrophils Relative %: 75 %
Platelets: 251 10*3/uL (ref 150–400)
RBC: 4.15 MIL/uL (ref 3.87–5.11)
RDW: 16.7 % — ABNORMAL HIGH (ref 11.5–15.5)
WBC: 7.7 10*3/uL (ref 4.0–10.5)
nRBC: 0 % (ref 0.0–0.2)

## 2023-05-07 LAB — BASIC METABOLIC PANEL WITH GFR
Anion gap: 9 (ref 5–15)
BUN: 11 mg/dL (ref 8–23)
CO2: 24 mmol/L (ref 22–32)
Calcium: 9.2 mg/dL (ref 8.9–10.3)
Chloride: 105 mmol/L (ref 98–111)
Creatinine, Ser: 0.97 mg/dL (ref 0.44–1.00)
GFR, Estimated: >60 mL/min (ref >60)
Glucose, Bld: 136 mg/dL — ABNORMAL HIGH (ref 70–99)
Potassium: 3.8 mmol/L (ref 3.5–5.1)
Sodium: 138 mmol/L (ref 135–145)

## 2023-05-07 LAB — CK: Total CK: 374 U/L — ABNORMAL HIGH (ref 38–234)

## 2023-05-07 MED ORDER — METHOCARBAMOL 500 MG PO TABS: 500.0000 mg | ORAL_TABLET | Freq: Three times a day (TID) | ORAL | 0 refills | Status: AC | PRN

## 2023-05-07 MED ORDER — SIMETHICONE 80 MG PO CHEW
80.0000 mg | CHEWABLE_TABLET | Freq: Once | ORAL | Status: AC
  Administered 2023-05-07: 80 mg via ORAL
  Filled 2023-05-07: qty 1

## 2023-05-07 NOTE — Progress Notes (Signed)
 LLE venous duplex has been completed.  Preliminary results given to Dr. Delana Favors.   Results can be found under chart review under CV PROC. 05/07/2023 5:29 PM Curby Carswell RVT, RDMS

## 2023-05-07 NOTE — Discharge Instructions (Addendum)
 You likely have sciatica.  Take Robaxin as needed  Your ultrasound did not show any blood clot and your blood count is normal today   See your doctor for follow-up  Return to ER if you have worse back pain or leg pain or trouble walking

## 2023-05-07 NOTE — ED Notes (Signed)
 Pt was given a ham sandwich and a soft drink.

## 2023-05-07 NOTE — ED Triage Notes (Signed)
 BIBEMS from home w/ c/o heaviness and sharp pain to left leg, radiating from her calf to upper thigh that lasted 20 minutes. Pt. Has been off her xalrelto x 3 weeks. HX afib.  Per EMS: BP: 156/78 HR: 60 SPO2: 98% RA CBG: 149

## 2023-05-07 NOTE — ED Provider Notes (Signed)
 [place] EMERGENCY DEPARTMENT AT [facility] Provider Note   CSN: [id number] Arrival date & time: 05/07/23  1410     History  Chief Complaint  Patient presents with   Leg Pain    Left    Dominique Barber is a 68 y.o. female hx of afib off xarelto , here presenting with left leg pain.  She states that she had sudden onset of left leg pain earlier today.  It lasted about 20 minutes.  She states that it is a shooting pain down her left leg.  She also felt that her leg was slightly sore.  Denies any trauma or injury.  Patient states that she is off Xarelto  currently because she has colon resection scheduled in several weeks.  The history is provided by the patient.       Home Medications Prior to Admission medications   Medication Sig Start Date End Date Taking? Authorizing Provider  acetaminophen  (TYLENOL ) 500 MG tablet Take 500 mg by mouth every 6 (six) hours as needed for mild pain.   Yes [provider]  albuterol  (VENTOLIN  HFA) 108 (90 Base) MCG/ACT inhaler Inhale 1-2 puffs into the lungs every 6 (six) hours as needed for wheezing or shortness of breath. 02/28/23  Yes [place], Peter M, MD  Alpha-D-Galactosidase (BEANO PO) Take 2 capsules by mouth with breakfast, with lunch, and with evening meal.   Yes [provider]  Calcium  Carb-Cholecalciferol (CALCIUM +D3 PO) Take 1 tablet by mouth at bedtime.   Yes [provider]  cetirizine  (ZYRTEC ) 10 MG tablet TAKE 1 TABLET DAILY 11/27/17  Yes Lajean Pike, MD  Evolocumab  (REPATHA  SURECLICK) 140 MG/ML SOAJ Inject 140 mg into the skin every 14 (fourteen) days. 10/28/22  Yes Arleen Lacer, MD  levothyroxine  (SYNTHROID ) 50 MCG tablet TAKE ONE AND ONE-HALF TABLETS DAILY BEFORE BREAKFAST Patient taking differently: Take 75 mcg by mouth daily before breakfast. 01/13/23  Yes Th[place], [place], MD  metoprolol  succinate (TOPROL -XL) 25 MG 24 hr tablet TAKE 1 TABLET TWICE A DAY (DISCONTINUE 50 MG) Patient taking  differently: Take 25 mg by mouth in the morning and at bedtime. 12/29/22  Yes Arleen Lacer, MD  nitroGLYCERIN  (NITROSTAT ) 0.4 MG SL tablet Place 1 tablet (0.4 mg total) under the tongue every 5 (five) minutes x 3 doses as needed for chest pain. 10/28/22  Yes Arleen Lacer, MD  Peppermint Oil (IBGARD PO) Take 2 tablets by mouth See admin instructions. Take 2 tablets by mouth after breakfast and supper 11/21/22  Yes [provider]  polyethylene glycol powder (GLYCOLAX/MIRALAX) 17 GM/SCOOP powder Take 17 g by mouth in the morning.   Yes [provider]  potassium chloride  SA (KLOR-CON  M) 20 MEQ tablet Take 1 tablet (20 mEq total) by mouth daily. 04/11/23  Yes Arleen Lacer, MD  Probiotic Product (ALIGN) 4 MG CAPS Take 4 mg by mouth in the morning.   Yes [provider]  Simethicone  125 MG TABS Take 125-250 mg by mouth See admin instructions. Take 250 mg by mouth after breakfast and 125 mg after each additional meal   Yes [provider]  XARELTO  20 MG TABS tablet TAKE 1 TABLET DAILY WITH SUPPER Patient not taking: Reported on 05/07/2023 08/22/22   Arleen Lacer, MD      Allergies    Iodinated contrast media, Linzess [linaclotide], Atorvastatin , Other, Milk-related compounds, Rosuvastatin  calcium , Simvastatin, Eluxadoline, Iohexol , Marinol [dronabinol], and Red dye #40 (allura red)    Review of Systems  Review of Systems  Musculoskeletal:        Left leg pain   All other systems reviewed and are negative.   Physical Exam Updated Vital Signs BP (!) 152/73   Pulse 64   Temp 98.1 F (36.7 C) (Oral)   Resp 18   Ht 5\' 2"  (1.575 m)   Wt 72.1 kg   SpO2 98%   BMI 29.08 kg/m  Physical Exam Vitals and nursing note reviewed.  Constitutional:      Appearance: Normal appearance.  HENT:     Head: Normocephalic.     Nose: Nose normal.     Mouth/Throat:     Mouth: Mucous membranes are moist.  Eyes:     Extraocular Movements: Extraocular movements  intact.     Pupils: Pupils are equal, round, and reactive to light.  Cardiovascular:     Rate and Rhythm: Normal rate and regular rhythm.     Pulses: Normal pulses.     Heart sounds: Normal heart sounds.  Pulmonary:     Effort: Pulmonary effort is normal.     Breath sounds: Normal breath sounds.  Abdominal:     General: Abdomen is flat.     Palpations: Abdomen is soft.  Musculoskeletal:     Cervical back: Normal range of motion and neck supple.     Comments: Mild tenderness in the posterior thigh.  No obvious swelling.  No obvious calf tenderness.  Straight leg raise positive on the left leg. No spinal tenderness  Skin:    General: Skin is warm.     Capillary Refill: Capillary refill takes less than 2 seconds.  Neurological:     General: No focal deficit present.     Mental Status: She is alert and oriented to person, place, and time.     Comments: No saddle anesthesia.  Patient has normal reflexes bilateral knees.  Normal sensation bilateral lower extremities and normal strength bilateral lower extremities  Psychiatric:        Mood and Affect: Mood normal.        Behavior: Behavior normal.     ED Results / Procedures / Treatments   Labs (all labs ordered are listed, but only abnormal results are displayed) Labs Reviewed  CBC WITH DIFFERENTIAL/PLATELET  BASIC METABOLIC PANEL WITH GFR  CK    EKG None  Radiology No results found.  Procedures Procedures    Medications Ordered in ED Medications - No data to display  ED Course/ Medical Decision Making/ A&P                                 Medical Decision Making Dominique Barber is a 68 y.o. female here presented with left leg pain.  I think likely sciatica.  However given history of A-fib and off Xarelto , I considered DVT as well.  Will get DVT study and CBC and BMP and CK level.  Will hold off on any back imaging currently as she has no saddle anesthesia and no neurodeficits  5:13 PM Labs unremarkable and CK is  374.  Ultrasound did not show any DVT.  Likely sciatica.  Hesitate to start patient on steroids but will try Robaxin as needed.  Problems Addressed: Pain of left lower extremity: acute illness or injury Sciatica of left side: acute illness or injury  Amount and/or Complexity of Data Reviewed Labs: ordered. Decision-making details documented in ED Course.  Risk OTC drugs.  Final Clinical Impression(s) / ED Diagnoses Final diagnoses:  None    Rx / DC Orders ED Discharge Orders     None         Dalene Duck, MD 05/07/23 1714

## 2023-05-10 ENCOUNTER — Telehealth: Payer: Self-pay | Admitting: Cardiology

## 2023-05-10 ENCOUNTER — Encounter: Payer: Self-pay | Admitting: Cardiology

## 2023-05-10 NOTE — Telephone Encounter (Signed)
 Husband called upset that our office hasn't contact the surgeon to be able to get the patient cleared for surgery. Explain to the husband that we haven't gotten a clearance form, to be able to clear the patient. Stated that we need to get in contact with them to get that done. Advise the husband that the office can called or either fax the information over. Husband wasn't happy with that, stated that he wanted dr harding to give him a call. Please advise

## 2023-05-10 NOTE — Telephone Encounter (Signed)
 Returned call to patient's husband who states that Dr Camilo Cella does not require cardiac clearance, but Dr Addie Holstein told patient that since she was off Xarelto , he wanted her to do "something differently with her medications." Husband was under the impression that Addie Holstein wanted to prescribe something else prior to her surgery or possibly to carry through afterwards. Colectomy currently scheduled for 05/25/23.

## 2023-05-11 NOTE — Telephone Encounter (Signed)
 Dominique Barber -patient's family is contacting me about preop assessment.  My last clinic note directly discusses her preop assessment.  The recommendations were to proceed with surgery.  I did make some recommendations: What I recommend is that we use amiodarone.  My note said the following.     Xarelto  (Rivaroxaban ) can be held for 2 to 3  days prior to surgery.  Please resume post op when felt to be safe.  Xarelto  currently on hold because of GI bleeding from adenocarcinoma, will plan to hold until decision made for surgery.  Would likely hold off until postop.  Record and restarting 1 to 2 days postop once stable from postop standpoint.   At risk of AFib during surgery. Prophylactic amiodarone planned. - Prescribe amiodarone 400 mg daily for one week preoperatively and 200 mg daily postoperatively if surgery is scheduled.     Randene Bustard, MD     We can contact her and get the prescription for amiodarone written if she does not already have it. Amiodarone 400 mg p.o. daily x 1 week starting 05/18/2023, then reduce to 1 tab daily for one 1 week postop-stop on 06/02/2023  Randene Bustard, MD

## 2023-05-11 NOTE — Telephone Encounter (Signed)
 What I recommend is that we use amiodarone.  My note said the following.   Xarelto  (Rivaroxaban ) can be held for 2 to 3  days prior to surgery.  Please resume post op when felt to be safe.  Xarelto  currently on hold because of GI bleeding from adenocarcinoma, will plan to hold until decision made for surgery.  Would likely hold off until postop.  Record and restarting 1 to 2 days postop once stable from postop standpoint.   At risk of AFib during surgery. Prophylactic amiodarone planned. - Prescribe amiodarone 400 mg daily for one week preoperatively and 200 mg daily postoperatively if surgery is scheduled.   Randene Bustard, MD

## 2023-05-12 NOTE — Progress Notes (Signed)
 Anesthesia Review:  PCP: Abraham Abo LOV 11/24/22  Cardiologist : Randene Bustard LVO 04/11/23  DR Peter [place] LOV 02/28/23   PPM/ ICD: Device Orders: Rep Notified:  Chest x-ray : CT chest- 04/07/23  EKG : 04/11/23 and 02/28/23  Echo : 03/31/23  Stress test: 2016  Cardiac Cath :  01/08/20   Activity level:  Sleep Study/ CPAP : Fasting Blood Sugar :      / Checks Blood Sugar -- times a day:    Blood Thinner/ Instructions /Last Dose: ASA / Instructions/ Last Dose :    Xarelto   05/07/23- cbc and BMP in ED    05/07/23- In ED with pain in LLE    Amiodarone

## 2023-05-12 NOTE — Telephone Encounter (Signed)
 Called the patient and made them aware Xarelto  can be held 2-3 days prior to surgery. Per patient she already stop taking Xarelto . Made patient that this will be restarted by provier once postop  Xarelto  (Rivaroxaban ) can be held for 2 to 3  days prior to surgery.  Please resume post op when felt to be safe.  Xarelto  currently on hold because of GI bleeding from adenocarcinoma, will plan to hold until decision made for surgery.  Would likely hold off until postop.  Record and restarting 1 to 2 days postop once stable from postop standpoint.   At risk of AFib during surgery. Prophylactic amiodarone planned. - Prescribe amiodarone 400 mg daily for one week preoperatively and 200 mg daily postoperatively if surgery is scheduled.

## 2023-05-12 NOTE — Patient Instructions (Signed)
 SURGICAL WAITING ROOM VISITATION  Patients having surgery or a procedure may have no more than 2 support people in the waiting area - these visitors may rotate.    Children under the age of 68 must have an adult with them who is not the patient.  Due to an increase in RSV and influenza rates and associated hospitalizations, children ages 19 and under may not visit patients in [facility] hospitals.  Visitors with respiratory illnesses are discouraged from visiting and should remain at home.  If the patient needs to stay at the hospital during part of their recovery, the visitor guidelines for inpatient rooms apply. Pre-op nurse will coordinate an appropriate time for 1 support person to accompany patient in pre-op.  This support person may not rotate.    Please refer to the [facility] website for the visitor guidelines for Inpatients (after your surgery is over and you are in a regular room).       Your procedure is scheduled on:  05/25/2023    Report to [facility] Main Entrance    Report to admitting at  0830 AM   Call this number if you have problems the morning of surgery [phone]          Clear lqiuid diet on day of bowel prep.               Folloow bowel prep instructons.    After Midnight you may have the following liquids until _ 0730_____ AM DAY OF SURGERY  Water Non-Citrus Juices (without pulp, NO RED-Apple, White grape, White cranberry) Black Coffee (NO MILK/CREAM OR CREAMERS, sugar ok)  Clear Tea (NO MILK/CREAM OR CREAMERS, sugar ok) regular and decaf                             Plain Jell-O (NO RED)                                           Fruit ices (not with fruit pulp, NO RED)                                     Popsicles (NO RED)                                                               Sports drinks like Gatorade (NO RED)              Drink 2 Ensure/G2 drinks AT 10:00 PM the night before surgery.        The day of surgery:  Drink ONE (1)  Pre-Surgery Clear Ensure or G2 at 0730 AM  ( have completed by ) the morning of surgery. Drink in one sitting. Do not sip.  This drink was given to you during your hospital  pre-op appointment visit. Nothing else to drink after completing the  Pre-Surgery Clear Ensure or G2.          If you have questions, please contact your surgeon's office.   FOLLOW BOWEL PREP AND ANY ADDITIONAL PRE  OP INSTRUCTIONS YOU RECEIVED FROM YOUR SURGEON'S OFFICE!!!     Oral Hygiene is also important to reduce your risk of infection.                                    Remember - BRUSH YOUR TEETH THE MORNING OF SURGERY WITH YOUR REGULAR TOOTHPASTE  DENTURES WILL BE REMOVED PRIOR TO SURGERY PLEASE DO NOT APPLY "Poly grip" OR ADHESIVES!!!   Do NOT smoke after Midnight   Stop all vitamins and herbal supplements 7 days before surgery.   Take these medicines the morning of surgery with A SIP OF WATER:  inhalers as usual and bring, zyrtec , synthroid , toprol    DO NOT TAKE ANY ORAL DIABETIC MEDICATIONS DAY OF YOUR SURGERY  Bring CPAP mask and tubing day of surgery.                              You may not have any metal on your body including hair pins, jewelry, and body piercing             Do not wear make-up, lotions, powders, perfumes/cologne, or deodorant  Do not wear nail polish including gel and S&S, artificial/acrylic nails, or any other type of covering on natural nails including finger and toenails. If you have artificial nails, gel coating, etc. that needs to be removed by a nail salon please have this removed prior to surgery or surgery may need to be canceled/ delayed if the surgeon/ anesthesia feels like they are unable to be safely monitored.   Do not shave  48 hours prior to surgery.               Men may shave face and neck.   Do not bring valuables to the hospital. [facility] IS NOT             RESPONSIBLE   FOR VALUABLES.   Contacts, glasses, dentures or bridgework may not be worn into  surgery.   Bring small overnight bag day of surgery.   DO NOT BRING YOUR HOME MEDICATIONS TO THE HOSPITAL. PHARMACY WILL DISPENSE MEDICATIONS LISTED ON YOUR MEDICATION LIST TO YOU DURING YOUR ADMISSION IN THE HOSPITAL!    Patients discharged on the day of surgery will not be allowed to drive home.  Someone NEEDS to stay with you for the first 24 hours after anesthesia.   Special Instructions: Bring a copy of your healthcare power of attorney and living will documents the day of surgery if you haven't scanned them before.              Please read over the following fact sheets you were given: IF YOU HAVE QUESTIONS ABOUT YOUR PRE-OP INSTRUCTIONS PLEASE CALL [phone]   If you received a COVID test during your pre-op visit  it is requested that you wear a mask when out in public, stay away from anyone that may not be feeling well and notify your surgeon if you develop symptoms. If you test positive for Covid or have been in contact with anyone that has tested positive in the last 10 days please notify you surgeon.    [facility] - Preparing for Surgery Before surgery, you can play an important role.  Because skin is not sterile, your skin needs to be as free of germs as possible.  You can reduce the number  of germs on your skin by washing with CHG (chlorahexidine gluconate) soap before surgery.  CHG is an antiseptic cleaner which kills germs and bonds with the skin to continue killing germs even after washing. Please DO NOT use if you have an allergy to CHG or antibacterial soaps.  If your skin becomes reddened/irritated stop using the CHG and inform your nurse when you arrive at Short Stay. Do not shave (including legs and underarms) for at least 48 hours prior to the first CHG shower.  You may shave your face/neck. Please follow these instructions carefully:  1.  Shower with CHG Soap the night before surgery and the  morning of Surgery.  2.  If you choose to wash your hair, wash your hair  first as usual with your  normal  shampoo.  3.  After you shampoo, rinse your hair and body thoroughly to remove the  shampoo.                           4.  Use CHG as you would any other liquid soap.  You can apply chg directly  to the skin and wash                       Gently with a scrungie or clean washcloth.  5.  Apply the CHG Soap to your body ONLY FROM THE NECK DOWN.   Do not use on face/ open                           Wound or open sores. Avoid contact with eyes, ears mouth and genitals (private parts).                       Wash face,  Genitals (private parts) with your normal soap.             6.  Wash thoroughly, paying special attention to the area where your surgery  will be performed.  7.  Thoroughly rinse your body with warm water from the neck down.  8.  DO NOT shower/wash with your normal soap after using and rinsing off  the CHG Soap.                9.  Pat yourself dry with a clean towel.            10.  Wear clean pajamas.            11.  Place clean sheets on your bed the night of your first shower and do not  sleep with pets. Day of Surgery : Do not apply any lotions/deodorants the morning of surgery.  Please wear clean clothes to the hospital/surgery center.  FAILURE TO FOLLOW THESE INSTRUCTIONS MAY RESULT IN THE CANCELLATION OF YOUR SURGERY PATIENT SIGNATURE_________________________________  NURSE SIGNATURE__________________________________  ________________________________________________________________________

## 2023-05-15 ENCOUNTER — Other Ambulatory Visit: Payer: Self-pay | Admitting: Endocrinology

## 2023-05-15 DIAGNOSIS — E890 Postprocedural hypothyroidism: Secondary | ICD-10-CM

## 2023-05-15 MED ORDER — AMIODARONE HCL 200 MG PO TABS: ORAL_TABLET | ORAL | 0 refills | Status: DC

## 2023-05-15 NOTE — Telephone Encounter (Signed)
 Spoke to Dr Addie Holstein-  start  Amiodarone

## 2023-05-15 NOTE — Telephone Encounter (Signed)
 Spoke patient's husband. He is aware that Amiodarone has been sent to pharmacy of husband choice ( [facility])    Mr Pray wanted to know if Dr Camilo Cella was aware. RN informed husband. Dr Addie Holstein sent Dr Camilo Cella a message and he is aware   Husband verbalized understanding.

## 2023-05-16 ENCOUNTER — Other Ambulatory Visit: Payer: Self-pay

## 2023-05-16 ENCOUNTER — Encounter (HOSPITAL_COMMUNITY): Payer: Self-pay

## 2023-05-16 ENCOUNTER — Encounter (HOSPITAL_COMMUNITY)
Admission: RE | Admit: 2023-05-16 | Discharge: 2023-05-16 | Disposition: A | Discharge status: Home / Self Care | Source: Ambulatory Visit | Attending: Surgery | Admitting: Surgery

## 2023-05-16 DIAGNOSIS — Z951 Presence of aortocoronary bypass graft: Secondary | ICD-10-CM | POA: Insufficient documentation

## 2023-05-16 DIAGNOSIS — Z01818 Encounter for other preprocedural examination: Secondary | ICD-10-CM

## 2023-05-16 DIAGNOSIS — Z01812 Encounter for preprocedural laboratory examination: Secondary | ICD-10-CM | POA: Insufficient documentation

## 2023-05-16 DIAGNOSIS — I10 Essential (primary) hypertension: Secondary | ICD-10-CM | POA: Insufficient documentation

## 2023-05-16 DIAGNOSIS — I255 Ischemic cardiomyopathy: Secondary | ICD-10-CM | POA: Insufficient documentation

## 2023-05-16 DIAGNOSIS — C182 Malignant neoplasm of ascending colon: Secondary | ICD-10-CM | POA: Diagnosis not present

## 2023-05-16 DIAGNOSIS — I2510 Atherosclerotic heart disease of native coronary artery without angina pectoris: Secondary | ICD-10-CM | POA: Insufficient documentation

## 2023-05-16 DIAGNOSIS — I480 Paroxysmal atrial fibrillation: Secondary | ICD-10-CM | POA: Insufficient documentation

## 2023-05-16 DIAGNOSIS — J449 Chronic obstructive pulmonary disease, unspecified: Secondary | ICD-10-CM | POA: Diagnosis not present

## 2023-05-16 HISTORY — DX: Headache, unspecified: R51.9

## 2023-05-16 HISTORY — DX: Dyspnea, unspecified: R06.00

## 2023-05-16 HISTORY — DX: Essential (primary) hypertension: I10

## 2023-05-16 HISTORY — DX: Pneumonia, unspecified organism: J18.9

## 2023-05-16 LAB — CBC WITH DIFFERENTIAL/PLATELET
Abs Immature Granulocytes: 0.03 10*3/uL (ref 0.00–0.07)
Basophils Absolute: 0.1 10*3/uL (ref 0.0–0.1)
Basophils Relative: 1 %
Eosinophils Absolute: 0.2 10*3/uL (ref 0.0–0.5)
Eosinophils Relative: 2 %
HCT: 34.9 % — ABNORMAL LOW (ref 36.0–46.0)
Hemoglobin: 10.6 g/dL — ABNORMAL LOW (ref 12.0–15.0)
Immature Granulocytes: 0 %
Lymphocytes Relative: 20 %
Lymphs Abs: 1.7 10*3/uL (ref 0.7–4.0)
MCH: 27 pg (ref 26.0–34.0)
MCHC: 30.4 g/dL (ref 30.0–36.0)
MCV: 88.8 fL (ref 80.0–100.0)
Monocytes Absolute: 0.6 10*3/uL (ref 0.1–1.0)
Monocytes Relative: 7 %
Neutro Abs: 6.1 10*3/uL (ref 1.7–7.7)
Neutrophils Relative %: 70 %
Platelets: 345 10*3/uL (ref 150–400)
RBC: 3.93 MIL/uL (ref 3.87–5.11)
RDW: 17.1 % — ABNORMAL HIGH (ref 11.5–15.5)
WBC: 8.7 10*3/uL (ref 4.0–10.5)
nRBC: 0 % (ref 0.0–0.2)

## 2023-05-16 LAB — BASIC METABOLIC PANEL WITH GFR
Anion gap: 7 (ref 5–15)
BUN: 15 mg/dL (ref 8–23)
CO2: 25 mmol/L (ref 22–32)
Calcium: 9.8 mg/dL (ref 8.9–10.3)
Chloride: 107 mmol/L (ref 98–111)
Creatinine, Ser: 1.09 mg/dL — ABNORMAL HIGH (ref 0.44–1.00)
GFR, Estimated: 56 mL/min — ABNORMAL LOW (ref >60)
Glucose, Bld: 107 mg/dL — ABNORMAL HIGH (ref 70–99)
Potassium: 4.2 mmol/L (ref 3.5–5.1)
Sodium: 139 mmol/L (ref 135–145)

## 2023-05-17 LAB — CEA: CEA: 13.4 ng/mL — ABNORMAL HIGH (ref 0.0–4.7)

## 2023-05-17 NOTE — Progress Notes (Signed)
 Anesthesia Chart Review   Case: [id number] Date/Time: 05/25/23 1015   Procedures:      COLECTOMY, RIGHT, LAPAROSCOPIC (Right) - LAPAROSCOPIC RIGHT HEMICOLECTOMY     LYSIS, ADHESIONS, LAPAROSCOPIC   Anesthesia type: General   Diagnosis: Adenocarcinoma, colon (HCC) [C18.9]   Pre-op diagnosis: COLON CANCER   Location: [facility] ROOM 02 / [facility]   Surgeons: Melvenia Stabs, MD       DISCUSSION:68 y.o. former smoker with h/o PONV, HTN, COPD, PAF, CAD h/o CABG and PCI, ischemic cardiomyopathy, colon cancer scheduled for above procedure 02/25/23 with Dr. Beatris Lincoln.   Pt with hemoglobin of 7 g/dL 2-3 weeks ago requiring a blood transfusion.  Subsequent colonoscopy revealed a 3cm cancerous tumor.   She was last seen by cardiology 04/11/2023. Per OV note, "Ms. Mctier's perioperative risk of a major cardiac event is 0.4% according to the Revised Cardiac Risk Index (RCRI).  Therefore, she is at low risk for perioperative complications.   Her functional capacity is good at 7.99 METs according to the Duke Activity Status Index (DASI).   -> She does have a history of CAD but has been revascularized with no active symptoms.  Normal EF with no active heart failure.  She does have intermittent A-fib-low threshold for perioperative use of amiodarone. => In the absence of ongoing angina or heart failure symptoms, would not recommend ischemic evaluation as it would not change therapy.   Recommendations: According to ACC/AHA guidelines, no further cardiovascular testing needed.  The patient may proceed to surgery at acceptable risk.   Antiplatelet and/or Anticoagulation Recommendations: Not on antiplatelet Xarelto  (Rivaroxaban ) can be held for 2 to 3  days prior to surgery.  Please resume post op when felt to be safe.  Xarelto  currently on hold because of GI bleeding from adenocarcinoma, will plan to hold until decision made for surgery.  Would likely hold off until postop.  Record and restarting 1 to 2  days postop once stable from postop standpoint.   At risk of AFib during surgery. Prophylactic amiodarone planned. - Prescribe amiodarone 400 mg daily for one week preoperatively and 200 mg daily postoperatively if surgery is scheduled."  Amiodarone prescribed by cardiology.   VS: BP 115/82   Pulse 92   Temp 36.7 C (Oral)   Resp 16   Ht 5\' 2"  (1.575 m)   Wt 72 kg   SpO2 99%   BMI 29.03 kg/m   PROVIDERS: Abraham Abo, MD is PCP    LABS: Labs reviewed: Acceptable for surgery. (all labs ordered are listed, but only abnormal results are displayed)  Labs Reviewed  CBC WITH DIFFERENTIAL/PLATELET - Abnormal; Notable for the following components:      Result Value   Hemoglobin 10.6 (*)    HCT 34.9 (*)    RDW 17.1 (*)    All other components within normal limits  BASIC METABOLIC PANEL WITH GFR - Abnormal; Notable for the following components:   Glucose, Bld 107 (*)    Creatinine, Ser 1.09 (*)    GFR, Estimated 56 (*)    All other components within normal limits  CEA - Abnormal; Notable for the following components:   CEA 13.4 (*)    All other components within normal limits     IMAGES:   EKG:   CV: Echo 03/31/2023 1. Left ventricular ejection fraction, by estimation, is 55 to 60%. The  left ventricle has normal function. The left ventricle has no regional  wall motion abnormalities. Left ventricular diastolic parameters  were  normal. Elevated left atrial pressure.  The average left ventricular global longitudinal strain is -17.6 %. The  global longitudinal strain is abnormal.   2. Right ventricular systolic function is normal. The right ventricular  size is normal. Tricuspid regurgitation signal is inadequate for assessing  PA pressure.   3. The mitral valve is normal in structure. Trivial mitral valve  regurgitation. No evidence of mitral stenosis.   4. The aortic valve is tricuspid. Aortic valve regurgitation is not  visualized. No aortic stenosis is present.    5. The inferior vena cava is normal in size with greater than 50%  respiratory variability, suggesting right atrial pressure of 3 mmHg.  Past Medical History:  Diagnosis Date   ACS (acute coronary syndrome) (HCC) 01/06/2020   With progressive angina while troponin elevation--borderline-NON-STEMI   ANTERIOR STEMI of LAD (x2). 01/19/1996   Complicated by VF arrest, and anoxic brain injury with status epilepticus; POBA of LAD --> 6 months later, after 2 vessel CABG she had postop complication with occluded LIMA/second anterior STEMI   CAD in native artery & Grafts 01/1996   a) 1/'98: Ant STEMI => LAD POBA; b) 2/'98: 95% prox LAD @ POBA site --> BMS PCI (3.0 mm x 25 mm); c) 7/98 (5 months later) => BMS ISR of LAD--> CABGx2 (LIMA-LAD, SVG-D1 -> Emergent redo w/ SVG-LAD b/c acute LIMA occlusion); d) 01/2020: ACS -> DES PCI of SVG-LAD (75%&85%), TO of SVG-D1.   COPD (chronic obstructive pulmonary disease) (HCC)    Dyspnea    GERD (gastroesophageal reflux disease)    Headache    Hiatal hernia    Hyperlipidemia    Hypertension    Hypothyroidism    On Synthroid    Iron deficiency anemia    Irritable bowel syndrome    Followed by Dr. Tami Falcon.   Ischemic cardiomyopathy 01/03/2020   ECHO (ACS): EF 45-50% (by Cath EF 40-45%) - Apical Inferior/Apical Anteroseptal & Apex akinetic, GR II DD.    PAF (paroxysmal atrial fibrillation) (HCC) 05/03/2014   New onset - originally with RVR   Pneumonia    x 2   PONV (postoperative nausea and vomiting)    very gag reflex   S/P CABG x 2; with EMERGENT Redo x 1. 07/1996   Initially LIMA-LAD, SVG-D1 --> immediate LIMA failure post CABG with anterior MI --> urgent redo SVG-LAD beyond D2.; Widely patent grafts as of 2005 with left dominant system. Graft to the diagonal branch has a very small target vessel but the vein graft to LAD retrograde fills a large bifurcating D2 and antegrade fills a large D3.   Tobacco abuse     Past Surgical History:  Procedure  Laterality Date   APPENDECTOMY     BIOPSY  05/08/2020   Procedure: BIOPSY;  Surgeon: Alvis Jourdain, MD;  Location: [facility];  Service: Endoscopy;;   BREAST EXCISIONAL BIOPSY Right    CESAREAN SECTION     CHOLECYSTECTOMY     COLONOSCOPY N/A 03/17/2023   Procedure: COLONOSCOPY;  Surgeon: Alvis Jourdain, MD;  Location: [facility];  Service: Gastroenterology;  Laterality: N/A;  biopsy   CORONARY ANGIOPLASTY  01/19/1996   Acute anterior STEMI- LAD POBA:  p-m LAD 70-80% narrowing & focal 95% stenosis in distal 3rd --> Treated with Emergent PTCA( POBA)  (Dr. Savilla Curls   CORONARY ANGIOPLASTY WITH STENT PLACEMENT  02/26/1996   3.0x29mm Multi-Link stent to prox/mid LAD (stenosis at recent PTCA site) (Dr. Savilla Curls)   CORONARY ARTERY BYPASS GRAFT  07/31/1996   x2; LIMA to LAD and SVG to 1st diagonal (Dr. Sherrill Dolin)   CORONARY ARTERY BYPASS GRAFT  07/31/1996   x1; SVG to distal LAD (Dr. Sherrill Dolin)   CORONARY STENT INTERVENTION N/A 01/08/2020   Procedure: CORONARY STENT INTERVENTION;  Surgeon: Arleen Lacer, MD;  Location: [facility] INVASIVE CV LAB;; , SVG-LAD severe focal disease with 75% followed by 85% calcified stenosis--DES PCI (Resolute Onyx 3.5 mm x 38 mm - 3.6 mm)   ESOPHAGOGASTRODUODENOSCOPY N/A 03/17/2023   Procedure: EGD (ESOPHAGOGASTRODUODENOSCOPY);  Surgeon: Alvis Jourdain, MD;  Location: [facility];  Service: Gastroenterology;  Laterality: N/A;  biopsy   ESOPHAGOGASTRODUODENOSCOPY (EGD) WITH PROPOFOL  N/A 05/08/2020   Procedure: ESOPHAGOGASTRODUODENOSCOPY (EGD) WITH PROPOFOL ;  Surgeon: Alvis Jourdain, MD;  Location[facility];  Service: Endoscopy;  Laterality: N/A;   LEFT HEART CATH AND CORONARY ANGIOGRAPHY  07/30/1996   normal L main, LAD w/95% stenosis just before stent, diffuse 80-85% end-stent restenosis, 1st diagonal with70-75% ostial stenosis, large optional diagonal that was normal, Cfx with 2 marginals and distal PLA (all normal), RCA normal (Dr. Savilla Curls)   LEFT HEART CATH  AND CORONARY ANGIOGRAPHY  01/19/1996   acute anterior wall MI, anoxic encephalopahty, VF; LCfx free of disease and dominant, RCA patent, L main short and patent, LAD with 70-80% narrowing and focal 95% stenosis in distal 3rd => PTCA  (Dr. Savilla Curls)   LEFT HEART CATH AND CORONARY ANGIOGRAPHY  01/18/2017   acute anterior wall MI, anoxic encephalopahty, VF; LCfx free of disease and dominant, RCA patent, L main short and patent, LAD with 70-80% narrowing and focal 95% stenosis in distal 3rd  (Dr. Savilla Curls)   LEFT HEART CATH AND CORS/GRAFTS ANGIOGRAPHY N/A 01/08/2020   Procedure: LEFT HEART CATH AND CORS/GRAFTS ANGIOGRAPHY;  Surgeon: Arleen Lacer, MD;  Location: [facility] INVASIVE CV LAB; EF 40 to 45%. Mildly elevated LVEDP. Proximal LAD 100% occlusion, mid-distal LAD fills via SVG that has collaterals to D1. Normal dominant native LCx and nondominant RCA. SVG-D1 ostial 100% CTO, SVG-LAD severe focal disease with 75% followed by 85% calcified stenosis--DES PCI   LEFT HEART CATH AND CORS/GRAFTS ANGIOGRAPHY  01/31/2003   LAD totally occluded in prox 3rd, patent Cfx, SVG to mid LAd patent, SVG to small DX1 patent, LIMA to LAD was atretic and essentially occluded in junction of prox & mid 3rd, R barciocephalic and subclavian were normal (Dr. Savilla Curls)   [facility] PERF WALL MOTION  10/2010; 06/2014   a) LexiScan  Cardiolite  - mild perfusion defect r/t infarct/scar with mild periifarct ischemia in mid anterior & apical anterior region; EF 67%; abnormal, low risk study;; b) Small, moderate intensity Fixed perfusion defect - mid Anterior Wall c/w known Anterior MI. LOW RISK    OVARIAN CYST REMOVAL     TRANSTHORACIC ECHOCARDIOGRAM  02/2012; 06/2014   a) EF 50-55%, mild HK of anterospetal myocardium; mild MR;; b) EF 55-60%, mild HK of apical anterior wall.   Mild MR   TRANSTHORACIC ECHOCARDIOGRAM  01/02/2020   EF 45 to 50%. Apical inferior, apical septal and apex akinetic. GRII DD. Otherwise normal valves,  normal RV. Normal atria. Normal pressures.    MEDICATIONS:  acetaminophen  (TYLENOL ) 500 MG tablet   albuterol  (VENTOLIN  HFA) 108 (90 Base) MCG/ACT inhaler   Alpha-D-Galactosidase (BEANO PO)   amiodarone (PACERONE) 200 MG tablet   Calcium  Carb-Cholecalciferol (CALCIUM +D3 PO)   cetirizine  (ZYRTEC ) 10 MG tablet   Evolocumab  (REPATHA  SURECLICK) 140 MG/ML SOAJ   levothyroxine  (SYNTHROID ) 50 MCG tablet  methocarbamol  (ROBAXIN ) 500 MG tablet   metoprolol  succinate (TOPROL -XL) 25 MG 24 hr tablet   nitroGLYCERIN  (NITROSTAT ) 0.4 MG SL tablet   Peppermint Oil (IBGARD PO)   polyethylene glycol powder (GLYCOLAX/MIRALAX) 17 GM/SCOOP powder   potassium chloride  (KLOR-CON ) 10 MEQ tablet   potassium chloride  SA (KLOR-CON  M) 20 MEQ tablet   Probiotic Product (ALIGN) 4 MG CAPS   Simethicone  125 MG TABS   XARELTO  20 MG TABS tablet   No current facility-administered medications for this encounter.    Chick Cotton Ward, PA-C [facility] [phone]

## 2023-05-17 NOTE — Anesthesia Preprocedure Evaluation (Addendum)
 Anesthesia Evaluation  Patient identified by MRN, date of birth, ID band Patient awake    Reviewed: Allergy & Precautions, NPO status   History of Anesthesia Complications (+) PONV and history of anesthetic complications  Airway Mallampati: III  TM Distance: >3 FB Neck ROM: Full    Dental  (+) Edentulous Upper Only has 6 teeth on the bottom, none are loose.:   Pulmonary shortness of breath, COPD, neg recent URI, former smoker   Pulmonary exam normal breath sounds clear to auscultation       Cardiovascular hypertension (metoprolol ), Pt. on home beta blockers (-) angina + CAD, + Past MI, + Cardiac Stents (most recent 2022) and + CABG (1998)  + dysrhythmias (proplylactic amiodarone  for surgery) Atrial Fibrillation  Rhythm:Regular Rate:Normal  HLD  TTE 03/31/2023: IMPRESSIONS    1. Left ventricular ejection fraction, by estimation, is 55 to 60%. The  left ventricle has normal function. The left ventricle has no regional  wall motion abnormalities. Left ventricular diastolic parameters were  normal. Elevated left atrial pressure.  The average left ventricular global longitudinal strain is -17.6 %. The  global longitudinal strain is abnormal.   2. Right ventricular systolic function is normal. The right ventricular  size is normal. Tricuspid regurgitation signal is inadequate for assessing  PA pressure.   3. The mitral valve is normal in structure. Trivial mitral valve  regurgitation. No evidence of mitral stenosis.   4. The aortic valve is tricuspid. Aortic valve regurgitation is not  visualized. No aortic stenosis is present.   5. The inferior vena cava is normal in size with greater than 50%  respiratory variability, suggesting right atrial pressure of 3 mmHg.   LHC 01/08/2020: SUMMARY  Severe single-vessel native disease with 100% occluded LAD after small sternal perforator/prior to D1.  100% occluded SVG-D1  CULPRIT:  Tandem 75 and 85% irregular lesions in the prox-midSVG-LAD  Successful PTCA-DES PCI of SVG-LAD covering both lesions using a resolute Onyx DES 3.5 mm x 38 mm postdilated to 3.6 mm.    (Unable to use distal protection-unable to cross with device) -> lesions reduced to 0% with brisk TIMI-3 flow post PCI and extensive collateral flow noted.  Widely patent dominant native LCx with high OM/Ramus as well as nondominant RCA.  Also widely patent LAD beyond the anastomosis (wraparound LAD)  Mildly reduced EF of 40 to 45% with anterior hypokinesis.  Moderately elevated PA with significant systemic hypertension-likely related to full bladder.     Neuro/Psych  Headaches, neg Seizures    GI/Hepatic Neg liver ROS, hiatal hernia,GERD  ,,Colon cancer, IBS   Endo/Other  neg diabetesHypothyroidism  Multinodular goiter  Renal/GU negative Renal ROS     Musculoskeletal   Abdominal   Peds  Hematology  (+) Blood dyscrasia, anemia Lab Results      Component                Value               Date                      WBC                      8.7                 05/16/2023                HGB  10.6 (L)            05/16/2023                HCT                      34.9 (L)            05/16/2023                MCV                      88.8                05/16/2023                PLT                      345                 05/16/2023              Anesthesia Other Findings Last Xarelto : ~1 month ago  Reproductive/Obstetrics                             Anesthesia Physical Anesthesia Plan  ASA: 3  Anesthesia Plan: General   Post-op Pain Management: Tylenol  PO (pre-op)*   Induction: Intravenous  PONV Risk Score and Plan: 4 or greater and Ondansetron , Dexamethasone, Midazolam  and Treatment may vary due to age or medical condition  Airway Management Planned: Oral ETT  Additional Equipment:   Intra-op Plan:   Post-operative Plan: Extubation in  OR  Informed Consent: I have reviewed the patients History and Physical, chart, labs and discussed the procedure including the risks, benefits and alternatives for the proposed anesthesia with the patient or authorized representative who has indicated his/her understanding and acceptance.     Dental advisory given  Plan Discussed with: CRNA and Anesthesiologist  Anesthesia Plan Comments: (See PAT note 05/16/2023  Risks of general anesthesia discussed including, but not limited to, sore throat, hoarse voice, chipped/damaged teeth, injury to vocal cords, nausea and vomiting, allergic reactions, lung infection, heart attack, stroke, and death. All questions answered. )       Anesthesia Quick Evaluation

## 2023-05-25 ENCOUNTER — Other Ambulatory Visit: Payer: Self-pay

## 2023-05-25 ENCOUNTER — Inpatient Hospital Stay (HOSPITAL_COMMUNITY): Payer: Self-pay | Admitting: Physician Assistant

## 2023-05-25 ENCOUNTER — Inpatient Hospital Stay (HOSPITAL_COMMUNITY): Admitting: Anesthesiology

## 2023-05-25 ENCOUNTER — Inpatient Hospital Stay (HOSPITAL_COMMUNITY)
Admission: RE | Admit: 2023-05-25 | Discharge: 2023-06-08 | DRG: 330 | Disposition: A | Discharge status: Home / Self Care | Attending: Surgery | Admitting: Surgery

## 2023-05-25 ENCOUNTER — Encounter (HOSPITAL_COMMUNITY)
Admission: RE | Disposition: A | Discharge status: Home / Self Care | Payer: Self-pay | Source: Home / Self Care | Attending: Surgery

## 2023-05-25 ENCOUNTER — Encounter (HOSPITAL_COMMUNITY): Payer: Self-pay | Admitting: Surgery

## 2023-05-25 DIAGNOSIS — C180 Malignant neoplasm of cecum: Principal | ICD-10-CM | POA: Diagnosis present

## 2023-05-25 DIAGNOSIS — E785 Hyperlipidemia, unspecified: Secondary | ICD-10-CM | POA: Diagnosis present

## 2023-05-25 DIAGNOSIS — J449 Chronic obstructive pulmonary disease, unspecified: Secondary | ICD-10-CM | POA: Diagnosis present

## 2023-05-25 DIAGNOSIS — I2510 Atherosclerotic heart disease of native coronary artery without angina pectoris: Secondary | ICD-10-CM | POA: Diagnosis present

## 2023-05-25 DIAGNOSIS — E1165 Type 2 diabetes mellitus with hyperglycemia: Secondary | ICD-10-CM | POA: Diagnosis not present

## 2023-05-25 DIAGNOSIS — I480 Paroxysmal atrial fibrillation: Secondary | ICD-10-CM | POA: Diagnosis not present

## 2023-05-25 DIAGNOSIS — K219 Gastro-esophageal reflux disease without esophagitis: Secondary | ICD-10-CM | POA: Diagnosis present

## 2023-05-25 DIAGNOSIS — Z8249 Family history of ischemic heart disease and other diseases of the circulatory system: Secondary | ICD-10-CM

## 2023-05-25 DIAGNOSIS — Z7901 Long term (current) use of anticoagulants: Secondary | ICD-10-CM

## 2023-05-25 DIAGNOSIS — J90 Pleural effusion, not elsewhere classified: Secondary | ICD-10-CM | POA: Diagnosis not present

## 2023-05-25 DIAGNOSIS — I25810 Atherosclerosis of coronary artery bypass graft(s) without angina pectoris: Secondary | ICD-10-CM | POA: Diagnosis present

## 2023-05-25 DIAGNOSIS — E871 Hypo-osmolality and hyponatremia: Secondary | ICD-10-CM | POA: Diagnosis not present

## 2023-05-25 DIAGNOSIS — Z79899 Other long term (current) drug therapy: Secondary | ICD-10-CM

## 2023-05-25 DIAGNOSIS — C182 Malignant neoplasm of ascending colon: Secondary | ICD-10-CM

## 2023-05-25 DIAGNOSIS — I252 Old myocardial infarction: Secondary | ICD-10-CM | POA: Diagnosis not present

## 2023-05-25 DIAGNOSIS — Y838 Other surgical procedures as the cause of abnormal reaction of the patient, or of later complication, without mention of misadventure at the time of the procedure: Secondary | ICD-10-CM | POA: Diagnosis not present

## 2023-05-25 DIAGNOSIS — D509 Iron deficiency anemia, unspecified: Secondary | ICD-10-CM | POA: Diagnosis not present

## 2023-05-25 DIAGNOSIS — Z9049 Acquired absence of other specified parts of digestive tract: Secondary | ICD-10-CM | POA: Diagnosis not present

## 2023-05-25 DIAGNOSIS — R739 Hyperglycemia, unspecified: Secondary | ICD-10-CM | POA: Diagnosis not present

## 2023-05-25 DIAGNOSIS — I4819 Other persistent atrial fibrillation: Secondary | ICD-10-CM | POA: Diagnosis present

## 2023-05-25 DIAGNOSIS — I4891 Unspecified atrial fibrillation: Secondary | ICD-10-CM | POA: Diagnosis not present

## 2023-05-25 DIAGNOSIS — Z4682 Encounter for fitting and adjustment of non-vascular catheter: Secondary | ICD-10-CM | POA: Diagnosis not present

## 2023-05-25 DIAGNOSIS — K449 Diaphragmatic hernia without obstruction or gangrene: Secondary | ICD-10-CM | POA: Diagnosis present

## 2023-05-25 DIAGNOSIS — I10 Essential (primary) hypertension: Secondary | ICD-10-CM | POA: Diagnosis not present

## 2023-05-25 DIAGNOSIS — Z8701 Personal history of pneumonia (recurrent): Secondary | ICD-10-CM

## 2023-05-25 DIAGNOSIS — K567 Ileus, unspecified: Secondary | ICD-10-CM | POA: Diagnosis not present

## 2023-05-25 DIAGNOSIS — R339 Retention of urine, unspecified: Secondary | ICD-10-CM | POA: Diagnosis not present

## 2023-05-25 DIAGNOSIS — N289 Disorder of kidney and ureter, unspecified: Secondary | ICD-10-CM | POA: Diagnosis not present

## 2023-05-25 DIAGNOSIS — Z5181 Encounter for therapeutic drug level monitoring: Secondary | ICD-10-CM | POA: Diagnosis not present

## 2023-05-25 DIAGNOSIS — Z87891 Personal history of nicotine dependence: Secondary | ICD-10-CM

## 2023-05-25 DIAGNOSIS — Z01818 Encounter for other preprocedural examination: Secondary | ICD-10-CM

## 2023-05-25 DIAGNOSIS — R7303 Prediabetes: Secondary | ICD-10-CM | POA: Diagnosis present

## 2023-05-25 DIAGNOSIS — K581 Irritable bowel syndrome with constipation: Secondary | ICD-10-CM | POA: Diagnosis present

## 2023-05-25 DIAGNOSIS — Z955 Presence of coronary angioplasty implant and graft: Secondary | ICD-10-CM

## 2023-05-25 DIAGNOSIS — C189 Malignant neoplasm of colon, unspecified: Secondary | ICD-10-CM | POA: Diagnosis not present

## 2023-05-25 DIAGNOSIS — K8689 Other specified diseases of pancreas: Secondary | ICD-10-CM | POA: Diagnosis not present

## 2023-05-25 DIAGNOSIS — E039 Hypothyroidism, unspecified: Secondary | ICD-10-CM | POA: Diagnosis not present

## 2023-05-25 DIAGNOSIS — K5730 Diverticulosis of large intestine without perforation or abscess without bleeding: Secondary | ICD-10-CM | POA: Diagnosis present

## 2023-05-25 DIAGNOSIS — Z8674 Personal history of sudden cardiac arrest: Secondary | ICD-10-CM | POA: Diagnosis not present

## 2023-05-25 DIAGNOSIS — Z79890 Hormone replacement therapy (postmenopausal): Secondary | ICD-10-CM

## 2023-05-25 DIAGNOSIS — I255 Ischemic cardiomyopathy: Secondary | ICD-10-CM | POA: Diagnosis present

## 2023-05-25 DIAGNOSIS — D72829 Elevated white blood cell count, unspecified: Secondary | ICD-10-CM | POA: Diagnosis not present

## 2023-05-25 DIAGNOSIS — K9189 Other postprocedural complications and disorders of digestive system: Secondary | ICD-10-CM | POA: Diagnosis not present

## 2023-05-25 DIAGNOSIS — Z91041 Radiographic dye allergy status: Secondary | ICD-10-CM

## 2023-05-25 DIAGNOSIS — E034 Atrophy of thyroid (acquired): Secondary | ICD-10-CM | POA: Diagnosis not present

## 2023-05-25 DIAGNOSIS — Z951 Presence of aortocoronary bypass graft: Secondary | ICD-10-CM

## 2023-05-25 DIAGNOSIS — D122 Benign neoplasm of ascending colon: Secondary | ICD-10-CM | POA: Diagnosis not present

## 2023-05-25 DIAGNOSIS — R109 Unspecified abdominal pain: Secondary | ICD-10-CM | POA: Diagnosis not present

## 2023-05-25 DIAGNOSIS — E876 Hypokalemia: Secondary | ICD-10-CM | POA: Diagnosis not present

## 2023-05-25 DIAGNOSIS — D259 Leiomyoma of uterus, unspecified: Secondary | ICD-10-CM | POA: Diagnosis not present

## 2023-05-25 HISTORY — PX: LAPAROSCOPIC RIGHT COLECTOMY: SHX5925

## 2023-05-25 HISTORY — PX: LAPAROSCOPIC LYSIS OF ADHESIONS: SHX5905

## 2023-05-25 LAB — TYPE AND SCREEN
ABO/RH(D): A POS
Antibody Screen: NEGATIVE

## 2023-05-25 SURGERY — COLECTOMY, RIGHT, LAPAROSCOPIC
Anesthesia: General | Laterality: Right

## 2023-05-25 MED ORDER — ACETAMINOPHEN 500 MG PO TABS
1000.0000 mg | ORAL_TABLET | ORAL | Status: DC
  Filled 2023-05-25: qty 2

## 2023-05-25 MED ORDER — NEOMYCIN SULFATE 500 MG PO TABS: 1000.0000 mg | ORAL_TABLET | ORAL | Status: DC

## 2023-05-25 MED ORDER — SUCCINYLCHOLINE CHLORIDE 200 MG/10ML IV SOSY
PREFILLED_SYRINGE | INTRAVENOUS | Status: DC | PRN
Start: 2023-05-25 — End: 2023-05-25
  Administered 2023-05-25: 100 mg via INTRAVENOUS

## 2023-05-25 MED ORDER — METOPROLOL SUCCINATE ER 25 MG PO TB24
25.0000 mg | ORAL_TABLET | Freq: Two times a day (BID) | ORAL | Status: DC
  Administered 2023-05-25 – 2023-05-27 (×4): 25 mg via ORAL
  Filled 2023-05-25 (×4): qty 1

## 2023-05-25 MED ORDER — ONDANSETRON HCL 4 MG/2ML IJ SOLN
INTRAMUSCULAR | Status: AC
  Filled 2023-05-25: qty 2

## 2023-05-25 MED ORDER — SODIUM CHLORIDE 0.9 % IV SOLN
2.0000 g | INTRAVENOUS | Status: AC
  Administered 2023-05-25: 2 g via INTRAVENOUS
  Filled 2023-05-25: qty 2

## 2023-05-25 MED ORDER — BISACODYL 5 MG PO TBEC: 20.0000 mg | DELAYED_RELEASE_TABLET | Freq: Once | ORAL | Status: DC

## 2023-05-25 MED ORDER — FENTANYL CITRATE (PF) 100 MCG/2ML IJ SOLN
INTRAMUSCULAR | Status: AC
Start: 2023-05-25 — End: ?
  Filled 2023-05-25: qty 2

## 2023-05-25 MED ORDER — SIMETHICONE 125 MG PO TABS: 125.0000 mg | ORAL_TABLET | ORAL | Status: DC

## 2023-05-25 MED ORDER — HEPARIN SODIUM (PORCINE) 5000 UNIT/ML IJ SOLN
5000.0000 [IU] | Freq: Once | INTRAMUSCULAR | Status: AC
  Administered 2023-05-25: 5000 [IU] via SUBCUTANEOUS
  Filled 2023-05-25: qty 1

## 2023-05-25 MED ORDER — BUPIVACAINE LIPOSOME 1.3 % IJ SUSP
20.0000 mL | Freq: Once | INTRAMUSCULAR | Status: DC
Start: 2023-05-25 — End: 2023-05-25

## 2023-05-25 MED ORDER — POTASSIUM CHLORIDE ER 10 MEQ PO TBCR
10.0000 meq | EXTENDED_RELEASE_TABLET | Freq: Every day | ORAL | Status: DC
  Administered 2023-05-26 – 2023-05-27 (×2): 10 meq via ORAL
  Filled 2023-05-25 (×6): qty 1

## 2023-05-25 MED ORDER — IBUPROFEN 400 MG PO TABS: 600.0000 mg | ORAL_TABLET | Freq: Four times a day (QID) | ORAL | Status: DC | PRN

## 2023-05-25 MED ORDER — LACTATED RINGERS IV SOLN: INTRAVENOUS | Status: DC

## 2023-05-25 MED ORDER — OXYCODONE HCL 5 MG/5ML PO SOLN: 5.0000 mg | Freq: Once | ORAL | Status: DC | PRN

## 2023-05-25 MED ORDER — FENTANYL CITRATE (PF) 100 MCG/2ML IJ SOLN
INTRAMUSCULAR | Status: DC | PRN
  Administered 2023-05-25 (×4): 50 ug via INTRAVENOUS

## 2023-05-25 MED ORDER — HEPARIN SODIUM (PORCINE) 5000 UNIT/ML IJ SOLN
5000.0000 [IU] | Freq: Three times a day (TID) | INTRAMUSCULAR | Status: DC
Start: 2023-05-25 — End: 2023-05-28
  Administered 2023-05-25 – 2023-05-28 (×9): 5000 [IU] via SUBCUTANEOUS
  Filled 2023-05-25 (×9): qty 1

## 2023-05-25 MED ORDER — NITROGLYCERIN 0.4 MG SL SUBL: 0.4000 mg | SUBLINGUAL_TABLET | SUBLINGUAL | Status: DC | PRN

## 2023-05-25 MED ORDER — ONDANSETRON HCL 4 MG PO TABS: 4.0000 mg | ORAL_TABLET | Freq: Four times a day (QID) | ORAL | Status: DC | PRN

## 2023-05-25 MED ORDER — PROPOFOL 10 MG/ML IV BOLUS
INTRAVENOUS | Status: DC | PRN
  Administered 2023-05-25: 150 mg via INTRAVENOUS

## 2023-05-25 MED ORDER — HYDROMORPHONE HCL 2 MG/ML IJ SOLN
INTRAMUSCULAR | Status: AC
  Filled 2023-05-25: qty 1

## 2023-05-25 MED ORDER — OXYCODONE HCL 5 MG PO TABS: 5.0000 mg | ORAL_TABLET | Freq: Once | ORAL | Status: DC | PRN

## 2023-05-25 MED ORDER — HYDROMORPHONE HCL 1 MG/ML IJ SOLN
0.5000 mg | INTRAMUSCULAR | Status: DC | PRN
  Administered 2023-05-25 – 2023-06-05 (×32): 0.5 mg via INTRAVENOUS
  Filled 2023-05-25 (×33): qty 0.5

## 2023-05-25 MED ORDER — ENSURE PRE-SURGERY PO LIQD: 592.0000 mL | Freq: Once | ORAL | Status: DC

## 2023-05-25 MED ORDER — LIDOCAINE HCL (PF) 2 % IJ SOLN
INTRAMUSCULAR | Status: AC
  Filled 2023-05-25: qty 5

## 2023-05-25 MED ORDER — LIDOCAINE HCL (CARDIAC) PF 100 MG/5ML IV SOSY
PREFILLED_SYRINGE | INTRAVENOUS | Status: DC | PRN
  Administered 2023-05-25: 100 mg via INTRAVENOUS

## 2023-05-25 MED ORDER — PROCHLORPERAZINE EDISYLATE 10 MG/2ML IJ SOLN
10.0000 mg | Freq: Four times a day (QID) | INTRAMUSCULAR | Status: DC | PRN
  Administered 2023-05-25 – 2023-05-27 (×2): 10 mg via INTRAVENOUS
  Filled 2023-05-25 (×2): qty 2

## 2023-05-25 MED ORDER — FENTANYL CITRATE PF 50 MCG/ML IJ SOSY
PREFILLED_SYRINGE | INTRAMUSCULAR | Status: AC
  Filled 2023-05-25: qty 1

## 2023-05-25 MED ORDER — DEXAMETHASONE SODIUM PHOSPHATE 10 MG/ML IJ SOLN
INTRAMUSCULAR | Status: DC | PRN
  Administered 2023-05-25: 10 mg via INTRAVENOUS

## 2023-05-25 MED ORDER — OYSTER SHELL CALCIUM/D3 500-5 MG-MCG PO TABS
ORAL_TABLET | Freq: Every day | ORAL | Status: DC
  Administered 2023-05-26: 1 via ORAL
  Filled 2023-05-25 (×2): qty 1

## 2023-05-25 MED ORDER — CHLORHEXIDINE GLUCONATE 0.12 % MT SOLN
15.0000 mL | Freq: Once | OROMUCOSAL | Status: DC
Start: 2023-05-25 — End: 2023-05-25

## 2023-05-25 MED ORDER — HYDROMORPHONE HCL 1 MG/ML IJ SOLN
INTRAMUSCULAR | Status: DC | PRN
  Administered 2023-05-25: .2 mg via INTRAVENOUS
  Administered 2023-05-25: .6 mg via INTRAVENOUS

## 2023-05-25 MED ORDER — LACTATED RINGERS IV SOLN
INTRAVENOUS | Status: DC
Start: 2023-05-25 — End: 2023-05-25

## 2023-05-25 MED ORDER — MIDAZOLAM HCL 2 MG/2ML IJ SOLN
INTRAMUSCULAR | Status: AC
  Filled 2023-05-25: qty 2

## 2023-05-25 MED ORDER — BUPIVACAINE LIPOSOME 1.3 % IJ SUSP
INTRAMUSCULAR | Status: DC | PRN
  Administered 2023-05-25: 20 mL

## 2023-05-25 MED ORDER — ENSURE SURGERY PO LIQD
237.0000 mL | Freq: Two times a day (BID) | ORAL | Status: DC
  Administered 2023-05-26 (×2): 237 mL via ORAL

## 2023-05-25 MED ORDER — BUPIVACAINE-EPINEPHRINE (PF) 0.25% -1:200000 IJ SOLN
INTRAMUSCULAR | Status: DC | PRN
  Administered 2023-05-25: 30 mL

## 2023-05-25 MED ORDER — ORAL CARE MOUTH RINSE: 15.0000 mL | Freq: Once | OROMUCOSAL | Status: DC

## 2023-05-25 MED ORDER — CHLORHEXIDINE GLUCONATE CLOTH 2 % EX PADS: 6.0000 | MEDICATED_PAD | Freq: Once | CUTANEOUS | Status: DC

## 2023-05-25 MED ORDER — METRONIDAZOLE 500 MG PO TABS: 1000.0000 mg | ORAL_TABLET | ORAL | Status: DC

## 2023-05-25 MED ORDER — DIPHENHYDRAMINE HCL 50 MG/ML IJ SOLN
12.5000 mg | Freq: Four times a day (QID) | INTRAMUSCULAR | Status: DC | PRN
  Administered 2023-06-03: 12.5 mg via INTRAVENOUS
  Filled 2023-05-25 (×2): qty 1

## 2023-05-25 MED ORDER — METHOCARBAMOL 500 MG PO TABS
500.0000 mg | ORAL_TABLET | Freq: Three times a day (TID) | ORAL | Status: DC | PRN
  Administered 2023-05-27: 500 mg via ORAL
  Filled 2023-05-25: qty 1

## 2023-05-25 MED ORDER — SUGAMMADEX SODIUM 200 MG/2ML IV SOLN
INTRAVENOUS | Status: DC | PRN
  Administered 2023-05-25: 200 mg via INTRAVENOUS

## 2023-05-25 MED ORDER — FENTANYL CITRATE (PF) 100 MCG/2ML IJ SOLN
INTRAMUSCULAR | Status: AC
  Filled 2023-05-25: qty 2

## 2023-05-25 MED ORDER — FENTANYL CITRATE PF 50 MCG/ML IJ SOSY
25.0000 ug | PREFILLED_SYRINGE | INTRAMUSCULAR | Status: DC | PRN
  Administered 2023-05-25: 25 ug via INTRAVENOUS

## 2023-05-25 MED ORDER — SODIUM CHLORIDE (PF) 0.9 % IJ SOLN
INTRAMUSCULAR | Status: AC
  Filled 2023-05-25: qty 10

## 2023-05-25 MED ORDER — SUCCINYLCHOLINE CHLORIDE 200 MG/10ML IV SOSY
PREFILLED_SYRINGE | INTRAVENOUS | Status: AC
  Filled 2023-05-25: qty 10

## 2023-05-25 MED ORDER — PROPOFOL 500 MG/50ML IV EMUL
INTRAVENOUS | Status: DC | PRN
Start: 2023-05-25 — End: 2023-05-25
  Administered 2023-05-25: 55 ug/kg/min via INTRAVENOUS

## 2023-05-25 MED ORDER — ONDANSETRON HCL 4 MG/2ML IJ SOLN
4.0000 mg | Freq: Four times a day (QID) | INTRAMUSCULAR | Status: DC | PRN
  Administered 2023-05-25 – 2023-05-27 (×2): 4 mg via INTRAVENOUS
  Filled 2023-05-25 (×3): qty 2

## 2023-05-25 MED ORDER — HYDRALAZINE HCL 20 MG/ML IJ SOLN: 10.0000 mg | INTRAMUSCULAR | Status: DC | PRN

## 2023-05-25 MED ORDER — ALVIMOPAN 12 MG PO CAPS: 12.0000 mg | ORAL_CAPSULE | Freq: Two times a day (BID) | ORAL | Status: DC

## 2023-05-25 MED ORDER — ALBUTEROL SULFATE (2.5 MG/3ML) 0.083% IN NEBU: 3.0000 mL | INHALATION_SOLUTION | Freq: Four times a day (QID) | RESPIRATORY_TRACT | Status: DC | PRN

## 2023-05-25 MED ORDER — ROCURONIUM BROMIDE 100 MG/10ML IV SOLN
INTRAVENOUS | Status: DC | PRN
  Administered 2023-05-25: 40 mg via INTRAVENOUS

## 2023-05-25 MED ORDER — LEVOTHYROXINE SODIUM 75 MCG PO TABS
75.0000 ug | ORAL_TABLET | Freq: Every day | ORAL | Status: DC
  Administered 2023-05-26 – 2023-05-28 (×3): 75 ug via ORAL
  Filled 2023-05-25 (×3): qty 1

## 2023-05-25 MED ORDER — 0.9 % SODIUM CHLORIDE (POUR BTL) OPTIME
TOPICAL | Status: DC | PRN
  Administered 2023-05-25: 1000 mL

## 2023-05-25 MED ORDER — PHENYLEPHRINE 80 MCG/ML (10ML) SYRINGE FOR IV PUSH (FOR BLOOD PRESSURE SUPPORT)
PREFILLED_SYRINGE | INTRAVENOUS | Status: AC
  Filled 2023-05-25: qty 10

## 2023-05-25 MED ORDER — PROPOFOL 1000 MG/100ML IV EMUL
INTRAVENOUS | Status: AC
  Filled 2023-05-25: qty 100

## 2023-05-25 MED ORDER — PHENYLEPHRINE 80 MCG/ML (10ML) SYRINGE FOR IV PUSH (FOR BLOOD PRESSURE SUPPORT)
PREFILLED_SYRINGE | INTRAVENOUS | Status: DC | PRN
  Administered 2023-05-25: 160 ug via INTRAVENOUS

## 2023-05-25 MED ORDER — SIMETHICONE 80 MG PO CHEW: 40.0000 mg | CHEWABLE_TABLET | Freq: Four times a day (QID) | ORAL | Status: DC | PRN

## 2023-05-25 MED ORDER — ALUM & MAG HYDROXIDE-SIMETH 200-200-20 MG/5ML PO SUSP
30.0000 mL | Freq: Four times a day (QID) | ORAL | Status: DC | PRN
  Administered 2023-05-26 – 2023-05-27 (×2): 30 mL via ORAL
  Filled 2023-05-25 (×2): qty 30

## 2023-05-25 MED ORDER — AMIODARONE HCL 200 MG PO TABS
200.0000 mg | ORAL_TABLET | Freq: Every day | ORAL | Status: DC
Start: 2023-05-26 — End: 2023-06-02
  Administered 2023-05-26 – 2023-05-27 (×2): 200 mg via ORAL
  Filled 2023-05-25 (×3): qty 1

## 2023-05-25 MED ORDER — PROPOFOL 10 MG/ML IV BOLUS
INTRAVENOUS | Status: AC
  Filled 2023-05-25: qty 20

## 2023-05-25 MED ORDER — ROCURONIUM BROMIDE 10 MG/ML (PF) SYRINGE
PREFILLED_SYRINGE | INTRAVENOUS | Status: AC
  Filled 2023-05-25: qty 10

## 2023-05-25 MED ORDER — ONDANSETRON HCL 4 MG/2ML IJ SOLN
INTRAMUSCULAR | Status: DC | PRN
  Administered 2023-05-25: 4 mg via INTRAVENOUS

## 2023-05-25 MED ORDER — DEXAMETHASONE SODIUM PHOSPHATE 10 MG/ML IJ SOLN
INTRAMUSCULAR | Status: AC
  Filled 2023-05-25: qty 1

## 2023-05-25 MED ORDER — DIPHENHYDRAMINE HCL 12.5 MG/5ML PO ELIX: 12.5000 mg | ORAL_SOLUTION | Freq: Four times a day (QID) | ORAL | Status: DC | PRN

## 2023-05-25 MED ORDER — ENSURE PRE-SURGERY PO LIQD
296.0000 mL | Freq: Once | ORAL | Status: DC
  Filled 2023-05-25: qty 296

## 2023-05-25 MED ORDER — TRAMADOL HCL 50 MG PO TABS
50.0000 mg | ORAL_TABLET | Freq: Four times a day (QID) | ORAL | Status: DC | PRN
  Administered 2023-05-25 – 2023-06-08 (×12): 50 mg via ORAL
  Filled 2023-05-25 (×13): qty 1

## 2023-05-25 MED ORDER — ACETAMINOPHEN 500 MG PO TABS
1000.0000 mg | ORAL_TABLET | Freq: Four times a day (QID) | ORAL | Status: DC
  Administered 2023-05-26 – 2023-05-27 (×6): 1000 mg via ORAL
  Filled 2023-05-25 (×8): qty 2

## 2023-05-25 MED ORDER — PHENYLEPHRINE HCL-NACL 20-0.9 MG/250ML-% IV SOLN
INTRAVENOUS | Status: DC | PRN
Start: 2023-05-25 — End: 2023-05-25
  Administered 2023-05-25: 40 ug/min via INTRAVENOUS

## 2023-05-25 MED ORDER — POLYETHYLENE GLYCOL 3350 17 GM/SCOOP PO POWD: 238.0000 g | Freq: Once | ORAL | Status: DC

## 2023-05-25 MED ORDER — AMISULPRIDE (ANTIEMETIC) 5 MG/2ML IV SOLN: 10.0000 mg | Freq: Once | INTRAVENOUS | Status: DC | PRN

## 2023-05-25 SURGICAL SUPPLY — 61 items
BAG COUNTER SPONGE SURGICOUNT (BAG) IMPLANT
BLADE EXTENDED COATED 6.5IN (ELECTRODE) IMPLANT
CABLE HIGH FREQUENCY MONO STRZ (ELECTRODE) IMPLANT
CATH MUSHROOM 30FR (CATHETERS) IMPLANT
CELLS DAT CNTRL 66122 CELL SVR (MISCELLANEOUS) IMPLANT
CLIP APPLIE 5 13 M/L LIGAMAX5 (MISCELLANEOUS) IMPLANT
CLIP APPLIE ROT 10 11.4 M/L (STAPLE) IMPLANT
DERMABOND ADVANCED .7 DNX12 (GAUZE/BANDAGES/DRESSINGS) IMPLANT
DISSECTOR BLUNT TIP ENDO 5MM (MISCELLANEOUS) IMPLANT
DRAIN CHANNEL 19F RND (DRAIN) IMPLANT
DRAPE SURG IRRIG POUCH 19X23 (DRAPES) ×2 IMPLANT
DRSG OPSITE POSTOP 4X10 (GAUZE/BANDAGES/DRESSINGS) IMPLANT
DRSG OPSITE POSTOP 4X6 (GAUZE/BANDAGES/DRESSINGS) IMPLANT
DRSG OPSITE POSTOP 4X8 (GAUZE/BANDAGES/DRESSINGS) IMPLANT
ELECT REM PT RETURN 15FT ADLT (MISCELLANEOUS) ×2 IMPLANT
EVACUATOR SILICONE 100CC (DRAIN) IMPLANT
GAUZE SPONGE 4X4 12PLY STRL (GAUZE/BANDAGES/DRESSINGS) IMPLANT
GLOVE BIO SURGEON STRL SZ7.5 (GLOVE) ×4 IMPLANT
GLOVE INDICATOR 8.0 STRL GRN (GLOVE) ×4 IMPLANT
GOWN STRL REUS W/ TWL XL LVL3 (GOWN DISPOSABLE) ×8 IMPLANT
HOLDER FOLEY CATH W/STRAP (MISCELLANEOUS) ×2 IMPLANT
IRRIGATION SUCT STRKRFLW 2 WTP (MISCELLANEOUS) ×2 IMPLANT
KIT TURNOVER KIT A (KITS) IMPLANT
LIGASURE IMPACT 36 18CM CVD LR (INSTRUMENTS) IMPLANT
NDL INSUFFLATION 14GA 120MM (NEEDLE) IMPLANT
NEEDLE INSUFFLATION 14GA 120MM (NEEDLE) IMPLANT
PACK COLON (CUSTOM PROCEDURE TRAY) ×2 IMPLANT
PAD POSITIONING PINK XL (MISCELLANEOUS) ×2 IMPLANT
PENCIL SMOKE EVACUATOR (MISCELLANEOUS) IMPLANT
RELOAD PROXIMATE 75MM BLUE (ENDOMECHANICALS) ×4 IMPLANT
RELOAD STAPLE 75 3.8 BLU REG (ENDOMECHANICALS) IMPLANT
RETRACTOR WND ALEXIS 18 MED (MISCELLANEOUS) IMPLANT
SCISSORS LAP 5X35 DISP (ENDOMECHANICALS) ×2 IMPLANT
SEALER TISSUE G2 STRG ARTC 35C (ENDOMECHANICALS) ×2 IMPLANT
SET TUBE SMOKE EVAC HIGH FLOW (TUBING) ×2 IMPLANT
SLEEVE ADV FIXATION 5X100MM (TROCAR) ×4 IMPLANT
SPIKE FLUID TRANSFER (MISCELLANEOUS) ×2 IMPLANT
SPONGE DRAIN TRACH 4X4 STRL 2S (GAUZE/BANDAGES/DRESSINGS) IMPLANT
STAPLER 90 3.5 STD SLIM (STAPLE) IMPLANT
STAPLER GUN LINEAR PROX 60 (STAPLE) IMPLANT
STAPLER PROXIMATE 75MM BLUE (STAPLE) IMPLANT
STAPLER SKIN PROX 35W (STAPLE) IMPLANT
SUT ETHILON 3 0 PS 1 (SUTURE) IMPLANT
SUT PDS AB 1 TP1 54 (SUTURE) IMPLANT
SUT PDS AB 1 TP1 96 (SUTURE) IMPLANT
SUT PROLENE 2 0 KS (SUTURE) ×2 IMPLANT
SUT PROLENE 2 0 SH DA (SUTURE) ×2 IMPLANT
SUT SILK 2 0 SH CR/8 (SUTURE) ×2 IMPLANT
SUT SILK 2-0 18XBRD TIE 12 (SUTURE) ×2 IMPLANT
SUT SILK 3 0 SH CR/8 (SUTURE) ×2 IMPLANT
SUT SILK 3-0 18XBRD TIE 12 (SUTURE) ×2 IMPLANT
SUT VIC AB 2-0 SH 27X BRD (SUTURE) IMPLANT
SUT VIC AB 3-0 SH 18 (SUTURE) IMPLANT
SUT VIC AB 3-0 SH 27X BRD (SUTURE) IMPLANT
SUT VICRYL AB 2 0 TIES (SUTURE) ×2 IMPLANT
SYSTEM LAPSCP GELPORT 120MM (MISCELLANEOUS) IMPLANT
SYSTEM WOUND ALEXIS 18CM MED (MISCELLANEOUS) IMPLANT
TOWEL OR 17X26 10 PK STRL BLUE (TOWEL DISPOSABLE) IMPLANT
TRAY IRRIG W/60CC SYR STRL (SET/KITS/TRAYS/PACK) ×2 IMPLANT
TROCAR ADV FIXATION 5X100MM (TROCAR) ×2 IMPLANT
TROCAR BALLN 12MMX100 BLUNT (TROCAR) IMPLANT

## 2023-05-25 NOTE — Anesthesia Procedure Notes (Signed)
 Procedure Name: Intubation Date/Time: 05/25/2023 10:21 AM  Performed by: Elaina Graver, CRNAPre-anesthesia Checklist: Patient identified, Emergency Drugs available, Suction available and Patient being monitored Patient Re-evaluated:Patient Re-evaluated prior to induction Oxygen Delivery Method: Circle System Utilized Preoxygenation: Pre-oxygenation with 100% oxygen Induction Type: IV induction and Rapid sequence Laryngoscope Size: Mac and 4 Grade View: Grade II Tube type: Oral Number of attempts: 1 Airway Equipment and Method: Stylet and Oral airway Placement Confirmation: ETT inserted through vocal cords under direct vision, positive ETCO2 and breath sounds checked- equal and bilateral Secured at: 21 cm Tube secured with: Tape Dental Injury: Teeth and Oropharynx as per pre-operative assessment

## 2023-05-25 NOTE — Transfer of Care (Signed)
 Immediate Anesthesia Transfer of Care Note  Patient: Dominique Barber  Procedure(s) Performed: COLECTOMY, RIGHT, LAPAROSCOPIC (Right) LYSIS, ADHESIONS, LAPAROSCOPIC  Patient Location: PACU  Anesthesia Type:General  Level of Consciousness: drowsy  Airway & Oxygen Therapy: Patient Spontanous Breathing and Patient connected to face mask oxygen  Post-op Assessment: Report given to RN and Post -op Vital signs reviewed and stable  Post vital signs: Reviewed and stable  Last Vitals:  Vitals Value Taken Time  BP 146/102 05/25/23 1245  Temp    Pulse 103 05/25/23 1248  Resp 14 05/25/23 1248  SpO2 100 % 05/25/23 1248  Vitals shown include unfiled device data.  Last Pain:  Vitals:   05/25/23 0914  TempSrc:   PainSc: 5          Complications: No notable events documented.

## 2023-05-25 NOTE — Progress Notes (Signed)
 MD Lucienne Ryder was notified that patient is c/o nausea. PRN IV Zofran  was given @1609  and no other is other PRN is available. Per MD Lucienne Ryder order IV Compazine and have pharmacy dose it.

## 2023-05-25 NOTE — H&P (Signed)
 CC: Here today for surgery  HPI: Dominique Barber is an 68 y.o. female with history of afib (previously on Xarelto , temporary off now), CAD, HTN, HLD, reformed tobacco user whom is seen in the office today as a referral by Dr. Nickey Barn for evaluation of newly diagnosed colon cancer.  She underwent colonoscopy with Dr. Nickey Barn 03/17/2023:  -She was found to have a malignant tumor in the ascending colon which was biopsied. - Three 3-7 mm polyps in transverse and ascending colon - Diverticulosis in the sigmoid  PATH A. STOMACH, BIOPSY  - Reactive gastropathy  - Negative for H. pylori on HE stain  - Negative for intestinal metaplasia or malignancy   B. COLON, ASCENDING, BIOPSY:  - Invasive adenocarcinoma, moderately differentiated.   CT CAP 03/29/23 Suspected 3.0 cm cecal mass, likely corresponding to the patient's known primary colonic adenocarcinoma.  13 mm hypoenhancing subcapsular lesion in segment 4, poorly evaluated. MRI abdomen with/without contrast is suggested for further evaluation. These results will be called to the ordering clinician or representative by the Radiologist Assistant, and communication documented in the PACS or [employer].  No evidence of metastatic disease in the chest.  MRI Abd 04/13/23 1. No suspicious contrast enhancement or diffusion restriction in the posterior aspect of hepatic segment IVA/B to correspond to finding of prior CT. There does however appear to be a small focus of fatty deposition in this location. No convincing evidence of metastatic disease. Attention on follow-up. 2. Status post cholecystectomy.  She was ultimately referred to see us .  Colonoscopy was done for iron deficiency anemia. She did require a blood transfusion and her Xarelto  has been discontinued due to this. She follows with Dr. Addie Holstein with cardiology whom is actually already seen her in anticipation of surgery.  She has a longstanding history of IBS-C and has  previously taken Linzess. She has been off that recently. She denies any nausea or vomiting. No evident blood in her stool.  PMH: afib (previously on Xarelto , temporary off now), CAD, HTN, HLD, reformed tobacco user  PSH:  1. Open appendectomy-1976 2. C-section x 03-1978, 1984, 1988 3. Ovarian cyst removal-1982 4. Coronary artery bypass-1998 5. Laparoscopic cholecystectomy-2003  She denies any changes in health or health history since we met in the office. No new medications/allergies. She states she is ready for surgery today.  FHx: Mother had breast cancer; otherwise, denies any known family history of colorectal, breast, endometrial or ovarian cancer  Social Hx: Denies use of tobacco/EtOH/illicit drug. She is happily retired, previously worked in Engineering geologist as a Conservation officer, nature. She is accompanied today by her husband who has been an excellent assistant and helping manage her care.    Past Medical History:  Diagnosis Date   ACS (acute coronary syndrome) (HCC) 01/06/2020   With progressive angina while troponin elevation--borderline-NON-STEMI   ANTERIOR STEMI of LAD (x2). 01/19/1996   Complicated by VF arrest, and anoxic brain injury with status epilepticus; POBA of LAD --> 6 months later, after 2 vessel CABG she had postop complication with occluded LIMA/second anterior STEMI   CAD in native artery & Grafts 01/1996   a) 1/'98: Ant STEMI => LAD POBA; b) 2/'98: 95% prox LAD @ POBA site --> BMS PCI (3.0 mm x 25 mm); c) 7/98 (5 months later) => BMS ISR of LAD--> CABGx2 (LIMA-LAD, SVG-D1 -> Emergent redo w/ SVG-LAD b/c acute LIMA occlusion); d) 01/2020: ACS -> DES PCI of SVG-LAD (75%&85%), TO of SVG-D1.   COPD (chronic obstructive pulmonary disease) (HCC)  Dyspnea    GERD (gastroesophageal reflux disease)    Headache    Hiatal hernia    Hyperlipidemia    Hypertension    Hypothyroidism    On Synthroid    Iron deficiency anemia    Irritable bowel syndrome    Followed by Dr. Tami Falcon.    Ischemic cardiomyopathy 01/03/2020   ECHO (ACS): EF 45-50% (by Cath EF 40-45%) - Apical Inferior/Apical Anteroseptal & Apex akinetic, GR II DD.    PAF (paroxysmal atrial fibrillation) (HCC) 05/03/2014   New onset - originally with RVR   Pneumonia    x 2   PONV (postoperative nausea and vomiting)    very gag reflex   S/P CABG x 2; with EMERGENT Redo x 1. 07/1996   Initially LIMA-LAD, SVG-D1 --> immediate LIMA failure post CABG with anterior MI --> urgent redo SVG-LAD beyond D2.; Widely patent grafts as of 2005 with left dominant system. Graft to the diagonal branch has a very small target vessel but the vein graft to LAD retrograde fills a large bifurcating D2 and antegrade fills a large D3.   Tobacco abuse     Past Surgical History:  Procedure Laterality Date   APPENDECTOMY     BIOPSY  05/08/2020   Procedure: BIOPSY;  Surgeon: Alvis Jourdain, MD;  Location: [facility];  Service: Endoscopy;;   BREAST EXCISIONAL BIOPSY Right    CESAREAN SECTION     CHOLECYSTECTOMY     COLONOSCOPY N/A 03/17/2023   Procedure: COLONOSCOPY;  Surgeon: Alvis Jourdain, MD;  Location: [facility];  Service: Gastroenterology;  Laterality: N/A;  biopsy   CORONARY ANGIOPLASTY  01/19/1996   Acute anterior STEMI- LAD POBA:  p-m LAD 70-80% narrowing & focal 95% stenosis in distal 3rd --> Treated with Emergent PTCA( POBA)  (Dr. Savilla Curls   CORONARY ANGIOPLASTY WITH STENT PLACEMENT  02/26/1996   3.0x76mm Multi-Link stent to prox/mid LAD (stenosis at recent PTCA site) (Dr. Savilla Curls)   CORONARY ARTERY BYPASS GRAFT  07/31/1996   x2; LIMA to LAD and SVG to 1st diagonal (Dr. Sherrill Dolin)   CORONARY ARTERY BYPASS GRAFT  07/31/1996   x1; SVG to distal LAD (Dr. Sherrill Dolin)   CORONARY STENT INTERVENTION N/A 01/08/2020   Procedure: CORONARY STENT INTERVENTION;  Surgeon: Arleen Lacer, MD;  Location: [facility] INVAS[facility] CV LAB;; , SVG-LAD severe focal disease with 75% followed by 85% calcified stenosis--DES PCI (Resolute Onyx 3.5 mm x  38 mm - 3.6 mm)   ESOPHAGOGASTRODUODENOSCOPY N/A 03/17/2023   Procedure: EGD (ESOPHAGOGASTRODUODENOSCOPY);  Surgeon: Alvis Jourdain, MD;  Location: [facility];  Service: Gastroenterology;  Laterality: N/A;  biopsy   ESOPHAGOGASTRODUODENOSCOPY (EGD) WITH PROPOFOL  N/A 05/08/2020   Procedure: ESOPHAGOGASTRODUODENOSCOPY (EGD) WITH PROPOFOL ;  Surgeon: Alvis Jourdain, MD;  Location: [facility];  Service: Endoscopy;  Laterality: N/A;   LEFT HEART CATH AND CORONARY ANGIOGRAPHY  07/30/1996   normal L main, LAD w/95% stenosis just before stent, diffuse 80-85% end-stent restenosis, 1st diagonal with70-75% ostial stenosis, large optional diagonal that was normal, Cfx with 2 marginals and distal PLA (all normal), RCA normal (Dr. Savilla Curls)   LEFT HEART CATH AND CORONARY ANGIOGRAPHY  01/19/1996   acute anterior wall MI, anoxic encephalopahty, VF; LCfx free of disease and dominant, RCA patent, L main short and patent, LAD with 70-80% narrowing and focal 95% stenosis in distal 3rd => PTCA  (Dr. Savilla Curls)   LEFT HEART CATH AND CORONARY ANGIOGRAPHY  01/18/2017   acute anterior wall MI, anoxic encephalopahty, VF; LCfx free of  disease and dominant, RCA patent, L main short and patent, LAD with 70-80% narrowing and focal 95% stenosis in distal 3rd  (Dr. Savilla Curls)   LEFT HEART CATH AND CORS/GRAFTS ANGIOGRAPHY N/A 01/08/2020   Procedure: LEFT HEART CATH AND CORS/GRAFTS ANGIOGRAPHY;  Surgeon: Arleen Lacer, MD;  Location: [facility] INVASIVE CV LAB; EF 40 to 45%. Mildly elevated LVEDP. Proximal LAD 100% occlusion, mid-distal LAD fills via SVG that has collaterals to D1. Normal dominant native LCx and nondominant RCA. SVG-D1 ostial 100% CTO, SVG-LAD severe focal disease with 75% followed by 85% calcified stenosis--DES PCI   LEFT HEART CATH AND CORS/GRAFTS ANGIOGRAPHY  01/31/2003   LAD totally occluded in prox 3rd, patent Cfx, SVG to mid LAd patent, SVG to small DX1 patent, LIMA to LAD was atretic and essentially  occluded in junction of prox & mid 3rd, R barciocephalic and subclavian were normal (Dr. Savilla Curls)   NM [facility] PERF WALL MOTION  10/2010; 06/2014   a) LexiScan  Cardiolite  - mild perfusion defect r/t infarct/scar with mild periifarct ischemia in mid anterior & apical anterior region; EF 67%; abnormal, low risk study;; b) Small, moderate intensity Fixed perfusion defect - mid Anterior Wall c/w known Anterior MI. LOW RISK    OVARIAN CYST REMOVAL     TRANSTHORACIC ECHOCARDIOGRAM  02/2012; 06/2014   a) EF 50-55%, mild HK of anterospetal myocardium; mild MR;; b) EF 55-60%, mild HK of apical anterior wall.   Mild MR   TRANSTHORACIC ECHOCARDIOGRAM  01/02/2020   EF 45 to 50%. Apical inferior, apical septal and apex akinetic. GRII DD. Otherwise normal valves, normal RV. Normal atria. Normal pressures.    Family History  Problem Relation Age of Onset   Heart failure Other    Diabetes Other    Diabetes Mother    Breast cancer Mother    Thyroid  disease Paternal Aunt    Thyroid  disease Maternal Grandmother     Social:  reports that she quit smoking about 3 years ago. Her smoking use included cigarettes. She has never used smokeless tobacco. She reports that she does not drink alcohol and does not use drugs.  Allergies:  Allergies  Allergen Reactions   Iodinated Contrast Media Hives and Dermatitis   Linzess [Linaclotide] Other (See Comments)    Caused horrible stomach pain and gas   Atorvastatin  Other (See Comments)    Gas pain in abdomen with no relief   Other Diarrhea and Other (See Comments)    IBS- Special diet   Milk-Related Compounds Diarrhea and Other (See Comments)    Has IBS   Rosuvastatin  Calcium  Other (See Comments)    Worsening GI upset   Simvastatin Other (See Comments)    Myalgia & GI upset   Eluxadoline Nausea And Vomiting and Other (See Comments)    Viberzi - stomach pain   Iohexol  Hives, Rash and Other (See Comments)   Marinol [Dronabinol] Palpitations and Other (See  Comments)    "Made me feel high and almost passed out"   Red Dye #40 (Allura Red) Hives and Rash    Red contrast dye     Medications: I have reviewed the patient's current medications.  No results found for this or any previous visit (from the past 48 hours).  No results found.   PE There were no vitals taken for this visit. Constitutional: NAD; conversant Eyes: Moist conjunctiva; no lid lag; anicteric Lungs: Normal respiratory effort CV: RRR GI: Abd soft, NT/ND Psychiatric: Appropriate affect  No results found for this  or any previous visit (from the past 48 hours).  No results found.  A/P: TAYELOR OSBORNE is an 68 y.o. female with hx of afib (previously on Xarelto , temporary off now), CAD, HTN, HLD, reformed tobacco user here for evaluation of newly diagnosed cecal adenocarcinoma  -Cardiac clearance was actually obtained before she visited with us -she saw Dr. Gerrie Krebs. He indicates plans to start her on amiodarone  1 week preoperatively and decrease the dose postoperatively to 200 mg.  -She has been off Xarelto  due to iron deficiency anemia/bleeding in the setting of a cecal adenocarcinoma. He does plan to have her restart this in the time following surgery.  -Genetics referral  - Preoperative CEA 13.4  -The anatomy and physiology of the GI tract was reviewed with the patient. The pathophysiology of colon cancer was discussed as well with associated pictures. -We have discussed various different treatment options going forward including surgery (the most definitive) to address this -laparoscopic right hemicolectomy, possible lysis of adhesions -The planned procedure, material risks (including, but not limited to, pain, bleeding, infection, scarring, need for blood transfusion, damage to surrounding structures- blood vessels/nerves/viscus/organs, damage to ureter, urine leak, leak from anastomosis, need for additional procedures, scenarios where a stoma may be necessary  and where it may be permanent, worsening of pre-existing medical conditions, chronic diarrhea, constipation secondary to narcotic use, hernia, recurrence, pneumonia, heart attack, stroke, death) benefits and alternatives to surgery were discussed at length. The patient's questions were answered to her and her husband's satisfaction, they voiced understanding and elected to proceed with surgery. Additionally, we discussed typical postoperative expectations and the recovery process.  Beatris Lincoln, MD [facility], [facility]

## 2023-05-25 NOTE — Plan of Care (Signed)
  Problem: Education: Goal: Understanding of discharge needs will improve Outcome: Progressing Goal: Verbalization of understanding of the causes of altered bowel function will improve Outcome: Progressing   Problem: Bowel/Gastric: Goal: Gastrointestinal status for postoperative course will improve Outcome: Progressing

## 2023-05-25 NOTE — Anesthesia Postprocedure Evaluation (Signed)
 Anesthesia Post Note  Patient: [name]  Procedure(s) Performed: COLECTOMY, RIGHT, LAPAROSCOPIC (Right) LYSIS, ADHESIONS, LAPAROSCOPIC     Patient location during evaluation: PACU Anesthesia Type: General Level of consciousness: awake Pain management: pain level controlled Vital Signs Assessment: post-procedure vital signs reviewed and stable Respiratory status: spontaneous breathing, nonlabored ventilation and respiratory function stable Cardiovascular status: blood pressure returned to baseline and stable Postop Assessment: no apparent nausea or vomiting Anesthetic complications: no   No notable events documented.  Last Vitals:  Vitals:   05/25/23 1415 05/25/23 1457  BP: (!) 135/97 (!) 152/94  Pulse: (!) 111 97  Resp: 15 16  Temp:  36.5 C  SpO2: 95% 97%    Last Pain:  Vitals:   05/25/23 1457  TempSrc: Oral  PainSc:                  Conard Decent

## 2023-05-25 NOTE — Op Note (Signed)
 PATIENT: Dominique Barber  68 y.o. female  Patient Care Team: Abraham Abo, MD as PCP - General (Family Medicine) Arleen Lacer, MD as PCP - Cardiology (Cardiology)  PREOP DIAGNOSIS: Colon cancer - cecum/proximal ascending colon  POSTOP DIAGNOSIS: Same  PROCEDURE: Laparoscopic right hemicolectomy  SURGEON: Renford Cartwright. Laylanie Kruczek, MD  ASSISTANT: Joyce Nixon, MD  An experienced assistant was required given the complexity of this procedure and the standard of surgical care. My assistant helped with exposure through counter tension, suctioning, ligation and retraction to better visualize the surgical field. My assistant expedited sewing during the case by following my sutures. Wherever I use the term "we" in the report, my assistant actively helped me with that portion of the procedure.   ANESTHESIA: General endotracheal  EBL: 25 mL Total I/O In: 1100 [I.V.:1000; IV Piggyback:100] Out: 55 [Urine:30; Blood:25]  DRAINS: None  SPECIMEN: Right colon (including terminal ileum, cecum, ascending colon, proximal transverse colon)  COUNTS: Sponge, needle and instrument counts were reported correct x2  FINDINGS:  Mass in the cecum/proximal ascending colon.  No evidence of metastatic disease on the peritoneum or liver.   NARRATIVE:  The patient was identified & brought into the operating room, placed supine on the operating table and SCDs were applied to the lower extremities. General endotracheal anesthesia was induced. The patient was positioned supine with arms tucked. Antibiotics were administered. A foley catheter was placed under sterile conditions. Hair in the region of planned surgery was clipped. The abdomen was prepped and draped in a sterile fashion. A timeout was performed confirming our patient and plan.   Beginning with the extraction port, a supraumbilical incision was made and carried down to the midline fascia. This was then incised with electrocautery. The peritoneum  was identified and elevated between clamps and carefully opened sharply. A small Alexis wound protector with a cap and associated port was then placed. The abdomen was insufflated to 15 mmHg with Co2. A laparoscope was placed and camera inspection revealed no evidence of injury. Bilateral TAP blocks were then performed under laparoscopic visualization using a mixture of 0.25% marcaine with epinepherine + Exparel. 3 additional ports were then placed under direct laparoscopic visualization - two in the left hemiabdomen and one in the right abdomen. The abdomen was surveyed. The liver and peritoneum appeared normal.  There were no signs of metastatic disease.  She was positioned in trendelenburg with left side down. The ileocolic pedicle was identified. Gentle blunt dissection commenced around the pedicle and the duodenum was identified and freed from the surrounding structures. We developed the retroperitoneal plane bluntly.  We then freed the appendix off its attachments to the pelvic wall. I mobilized the terminal ileum.  We took care to avoid injuring any retroperitoneal structures.  After this wee began to mobilize laterally down the Lyndall Bellot line of Toldt and then took down the hepatic flexure using the Enseal device.  We mobilized the omentum off of the right transverse colon. The entire colon was then flipped medially and mobilized off of the retroperitoneal structures until we could visualize the lateral edge of the duodenum underneath.  We gently freed the duodenal attachments.   At this point, the abdomen was desufflated and the terminal ileum and right colon delivered through the wound protector. The terminal ileum was then transected using a GIA blue load stapler. The remaining mesentery was divided using the Enseal device. The divided mesentery was inspected and noted to be hemostatic. The distal point of transection was then identified  on the transverse colon at a location that included the right branch  of the middle colics, leaving the main middle colic feeding the remaining transverse colon. This was transected using another blue load GIA stapler.  The specimen was then passed off. Attention was turned to creating the anastomosis. The terminal ileum and transverse colon were inspected for orientation to ensure no twisting nor bowel included in the mesenteric defect. An anastomosis was created between the terminal ileum and the transverse colon using a 75 mm GIA blue load stapler. The staple line was inspected and noted to be hemostatic.  The common enterotomy channel was closed using a TA 60 blue load stapler. Hemostasis was achieved at the staple line using 3-0 silk U-stitches. 3-0 silk sutures were used to imbricate the corners of the staple line as well.  A 2-0 silk suture was placed securing the "crotch" of the anastomosis. The anastomosis was palpated and noted to be widely patent. This was then placed back into the abdomen. The abdomen was then irrigated with sterile saline and hemostasis verified. The omentum was then brought down over the anastomosis. The wound protector cap was replaced and CO2 reinsufflated. The laparoscopic ports were removed under direct visualization and the sites noted to be hemostatic. The Alexis wound protector was removed, counts were reported correct, and we switched to clean instruments, gowns and drapes.  The fascia was then closed using two running #1 PDS sutures.  The skin of all incision sites was closed with 4-0 monocryl subcuticular suture. Dermabond was placed on the port sites and a sterile dressing was placed over the abdominal incision. All counts were reported correct. The patient was then awakened from anesthesia and sent to the post anesthesia care unit in stable condition.   Of note, the specimen was opened on the back table to confirm findings.  DISPOSITION: PACU in satisfactory condition

## 2023-05-25 NOTE — Consult Note (Signed)
 [facility] Initial Consultation Note  MAEOLA Dominique Barber:[id number] DOB: 1955/09/01 DOA: 05/25/2023  PCP: Abraham Abo, MD   Requesting Physician: Dr. Camilo Cella   Reason for Consultation: Medical Management  HPI: Dominique Barber is a 68 y.o. female with medical history significant for COPD on room air, GERD, hypertension, hyperlipidemia, hypothyroidism, paroxysmal atrial fibrillation who was admitted to the surgical service today for an elective laparoscopic right hemicolectomy performed without complication by Dr. Camilo Cella and is now being seen by the hospitalist service for medical management.  Patient was seen and examined in recovery where she was resting comfortably, with the only complaint being mild abdominal pain.  She denies chest pain, fevers, nausea or vomiting.  She was seen for preoperative clearance prior to surgery, and has taken 1 week of oral amiodarone  prior to surgery.  She also held her Xarelto  recently due to some evidence of GI bleeding, with plans to restart the Xarelto  postoperatively when okay with general surgery.  Review of Systems: Please see HPI for pertinent positives and negatives. A complete 10 system review of systems are otherwise negative.  Past Medical History:  Diagnosis Date   ACS (acute coronary syndrome) (HCC) 01/06/2020   With progressive angina while troponin elevation--borderline-NON-STEMI   ANTERIOR STEMI of LAD (x2). 01/19/1996   Complicated by VF arrest, and anoxic brain injury with status epilepticus; POBA of LAD --> 6 months later, after 2 vessel CABG she had postop complication with occluded LIMA/second anterior STEMI   CAD in native artery & Grafts 01/1996   a) 1/'98: Ant STEMI => LAD POBA; b) 2/'98: 95% prox LAD @ POBA site --> BMS PCI (3.0 mm x 25 mm); c) 7/98 (5 months later) => BMS ISR of LAD--> CABGx2 (LIMA-LAD, SVG-D1 -> Emergent redo w/ SVG-LAD b/c acute LIMA occlusion); d) 01/2020: ACS -> DES PCI of SVG-LAD (75%&85%), TO of  SVG-D1.   COPD (chronic obstructive pulmonary disease) (HCC)    Dyspnea    GERD (gastroesophageal reflux disease)    Headache    Hiatal hernia    Hyperlipidemia    Hypertension    Hypothyroidism    On Synthroid    Iron deficiency anemia    Irritable bowel syndrome    Followed by Dr. Tami Falcon.   Ischemic cardiomyopathy 01/03/2020   ECHO (ACS): EF 45-50% (by Cath EF 40-45%) - Apical Inferior/Apical Anteroseptal & Apex akinetic, GR II DD.    PAF (paroxysmal atrial fibrillation) (HCC) 05/03/2014   New onset - originally with RVR   Pneumonia    x 2   PONV (postoperative nausea and vomiting)    very gag reflex   S/P CABG x 2; with EMERGENT Redo x 1. 07/1996   Initially LIMA-LAD, SVG-D1 --> immediate LIMA failure post CABG with anterior MI --> urgent redo SVG-LAD beyond D2.; Widely patent grafts as of 2005 with left dominant system. Graft to the diagonal branch has a very small target vessel but the vein graft to LAD retrograde fills a large bifurcating D2 and antegrade fills a large D3.   Tobacco abuse    Past Surgical History:  Procedure Laterality Date   APPENDECTOMY     BIOPSY  05/08/2020   Procedure: BIOPSY;  Surgeon: Alvis Jourdain, MD;  Location: [facility];  Service: Endoscopy;;   BREAST EXCISIONAL BIOPSY Right    CESAREAN SECTION     CHOLECYSTECTOMY     COLONOSCOPY N/A 03/17/2023   Procedure: COLONOSCOPY;  Surgeon: Alvis Jourdain, MD;  Location: [facility];  Service: Gastroenterology;  Laterality: N/A;  biopsy   CORONARY ANGIOPLASTY  01/19/1996   Acute anterior STEMI- LAD POBA:  p-m LAD 70-80% narrowing & focal 95% stenosis in distal 3rd --> Treated with Emergent PTCA( POBA)  (Dr. Savilla Curls   CORONARY ANGIOPLASTY WITH STENT PLACEMENT  02/26/1996   3.0x45mm Multi-Link stent to prox/mid LAD (stenosis at recent PTCA site) (Dr. Savilla Curls)   CORONARY ARTERY BYPASS GRAFT  07/31/1996   x2; LIMA to LAD and SVG to 1st diagonal (Dr. Sherrill Dolin)   CORONARY ARTERY BYPASS GRAFT   07/31/1996   x1; SVG to distal LAD (Dr. Sherrill Dolin)   CORONARY STENT INTERVENTION N/A 01/08/2020   Procedure: CORONARY STENT INTERVENTION;  Surgeon: Arleen Lacer, MD;  Location: [facility];; , SVG-LAD severe focal disease with 75% followed by 85% calcified stenosis--DES PCI (Resolute Onyx 3.5 mm x 38 mm - 3.6 mm)   ESOPHAGOGASTRODUODENOSCOPY N/A 03/17/2023   Procedure: EGD (ESOPHAGOGASTRODUODENOSCOPY);  Surgeon: Alvis Jourdain, MD;  Location: [facility];  Service: Gastroenterology;  Laterality: N/A;  biopsy   ESOPHAGOGASTRODUODENOSCOPY (EGD) WITH PROPOFOL  N/A 05/08/2020   Procedure: ESOPHAGOGASTRODUODENOSCOPY (EGD) WITH PROPOFOL ;  Surgeon: Alvis Jourdain, MD;  Location: [facility];  Service: Endoscopy;  Laterality: N/A;   LEFT HEART CATH AND CORONARY ANGIOGRAPHY  07/30/1996   normal L main, LAD w/95% stenosis just before stent, diffuse 80-85% end-stent restenosis, 1st diagonal with70-75% ostial stenosis, large optional diagonal that was normal, Cfx with 2 marginals and distal PLA (all normal), RCA normal (Dr. Savilla Curls)   LEFT HEART CATH AND CORONARY ANGIOGRAPHY  01/19/1996   acute anterior wall MI, anoxic encephalopahty, VF; LCfx free of disease and dominant, RCA patent, L main short and patent, LAD with 70-80% narrowing and focal 95% stenosis in distal 3rd => PTCA  (Dr. Savilla Curls)   LEFT HEART CATH AND CORONARY ANGIOGRAPHY  01/18/2017   acute anterior wall MI, anoxic encephalopahty, VF; LCfx free of disease and dominant, RCA patent, L main short and patent, LAD with 70-80% narrowing and focal 95% stenosis in distal 3rd  (Dr. Savilla Curls)   LEFT HEART CATH AND CORS/GRAFTS ANGIOGRAPHY N/A 01/08/2020   Procedure: LEFT HEART CATH AND CORS/GRAFTS ANGIOGRAPHY;  Surgeon: Arleen Lacer, MD;  Location: [facility]; EF 40 to 45%. Mildly elevated LVEDP. Proximal LAD 100% occlusion, mid-distal LAD fills via SVG that has collaterals to D1. Normal dominant native LCx and nondominant  RCA. SVG-D1 ostial 100% CTO, SVG-LAD severe focal disease with 75% followed by 85% calcified stenosis--DES PCI   LEFT HEART CATH AND CORS/GRAFTS ANGIOGRAPHY  01/31/2003   LAD totally occluded in prox 3rd, patent Cfx, SVG to mid LAd patent, SVG to small DX1 patent, LIMA to LAD was atretic and essentially occluded in junction of prox & mid 3rd, R barciocephalic and subclavian were normal (Dr. Savilla Curls)   NM [facility] PERF WALL MOTION  10/2010; 06/2014   a) LexiScan  Cardiolite  - mild perfusion defect r/t infarct/scar with mild periifarct ischemia in mid anterior & apical anterior region; EF 67%; abnormal, low risk study;; b) Small, moderate intensity Fixed perfusion defect - mid Anterior Wall c/w known Anterior MI. LOW RISK    OVARIAN CYST REMOVAL     TRANSTHORACIC ECHOCARDIOGRAM  02/2012; 06/2014   a) EF 50-55%, mild HK of anterospetal myocardium; mild MR;; b) EF 55-60%, mild HK of apical anterior wall.   Mild MR   TRANSTHORACIC ECHOCARDIOGRAM  01/02/2020   EF 45 to 50%. Apical inferior, apical septal and apex akinetic.  GRII DD. Otherwise normal valves, normal RV. Normal atria. Normal pressures.    Social History:  reports that she quit smoking about 3 years ago. Her smoking use included cigarettes. She has never used smokeless tobacco. She reports that she does not drink alcohol and does not use drugs.  Allergies  Allergen Reactions   Iodinated Contrast Media Hives and Dermatitis   Linzess [Linaclotide] Other (See Comments)    Caused horrible stomach pain and gas   Atorvastatin  Other (See Comments)    Gas pain in abdomen with no relief   Other Diarrhea and Other (See Comments)    IBS- Special diet   Milk-Related Compounds Diarrhea and Other (See Comments)    Has IBS   Rosuvastatin  Calcium  Other (See Comments)    Worsening GI upset   Simvastatin Other (See Comments)    Myalgia & GI upset   Eluxadoline Nausea And Vomiting and Other (See Comments)    Viberzi - stomach pain   Iohexol  Hives,  Rash and Other (See Comments)   Marinol [Dronabinol] Palpitations and Other (See Comments)    "Made me feel high and almost passed out"   Red Dye #40 (Allura Red) Hives and Rash    Red contrast dye     Family History  Problem Relation Age of Onset   Heart failure Other    Diabetes Other    Diabetes Mother    Breast cancer Mother    Thyroid  disease Paternal Aunt    Thyroid  disease Maternal Grandmother      Prior to Admission medications   Medication Sig Start Date End Date Taking? Authorizing Provider  acetaminophen  (TYLENOL ) 500 MG tablet Take 500 mg by mouth every 6 (six) hours as needed for mild pain.   Yes [provider]  albuterol  (VENTOLIN  HFA) 108 (90 Base) MCG/ACT inhaler Inhale 1-2 puffs into the lungs every 6 (six) hours as needed for wheezing or shortness of breath. 02/28/23  Yes [place], Peter M, MD  Alpha-D-Galactosidase (BEANO PO) Take 2 capsules by mouth See admin instructions. Take 2 capsules after lunch and dinner   Yes [provider]  amiodarone  (PACERONE ) 200 MG tablet Take 2 tablets (400 mg total) by mouth daily for 7 days, THEN 1 tablet (200 mg total) daily for 7 days. Start 400 mg on 05/18/23  and start the 200 mg  after surgery for one week  then stop.. 05/15/23 05/29/23 Yes Arleen Lacer, MD  Calcium  Carb-Cholecalciferol (CALCIUM +D3 PO) Take 1 tablet by mouth at bedtime.   Yes [provider]  cetirizine  (ZYRTEC ) 10 MG tablet TAKE 1 TABLET DAILY 11/27/17  Yes Lajean Pike, MD  Evolocumab  (REPATHA  SURECLICK) 140 MG/ML SOAJ Inject 140 mg into the skin every 14 (fourteen) days. 10/28/22  Yes Arleen Lacer, MD  levothyroxine  (SYNTHROID ) 50 MCG tablet TAKE ONE AND ONE-HALF TABLETS DAILY BEFORE BREAKFAST Patient taking differently: Take 75 mcg by mouth daily before breakfast. 01/13/23  Yes [place], [place], MD  metoprolol  succinate (TOPROL -XL) 25 MG 24 hr tablet TAKE 1 TABLET TWICE A DAY (DISCONTINUE 50 MG) 12/29/22  Yes Arleen Lacer, MD   nitroGLYCERIN  (NITROSTAT ) 0.4 MG SL tablet Place 1 tablet (0.4 mg total) under the tongue every 5 (five) minutes x 3 doses as needed for chest pain. 10/28/22  Yes Arleen Lacer, MD  Peppermint Oil (IBGARD PO) Take 1-2 tablets by mouth See admin instructions. Take 2 after lunch and 1 tablet after supper, may take a 1 tablet dose as needed for  IBS symptoms 11/21/22  Yes [provider]  polyethylene glycol powder (GLYCOLAX/MIRALAX) 17 GM/SCOOP powder Take 12.75 g by mouth in the morning.   Yes [provider]  potassium chloride  (KLOR-CON ) 10 MEQ tablet Take 10 mEq by mouth daily.   Yes [provider]  Probiotic Product (ALIGN) 4 MG CAPS Take 4 mg by mouth in the morning.   Yes [provider]  Simethicone  125 MG TABS Take 125-250 mg by mouth See admin instructions. Take 250 mg by mouth after lunch and 125 mg after each additional meal   Yes [provider]  methocarbamol  (ROBAXIN ) 500 MG tablet Take 1 tablet (500 mg total) by mouth every 8 (eight) hours as needed for muscle spasms. 05/07/23   Dalene Duck, MD  potassium chloride  SA (KLOR-CON  M) 20 MEQ tablet Take 1 tablet (20 mEq total) by mouth daily. Patient not taking: Reported on 05/10/2023 04/11/23   Arleen Lacer, MD  XARELTO  20 MG TABS tablet TAKE 1 TABLET DAILY WITH SUPPER 08/22/22   Arleen Lacer, MD    Physical Exam: BP 115/88   Pulse (!) 101   Temp 97.7 F (36.5 C) (Oral)   Resp 16   Wt 72 kg   SpO2 99%   BMI 29.03 kg/m  General:  Alert, oriented, calm, in no acute distress, somewhat drowsy but easily arousable answering questions appropriately Cardiovascular: RRR, no murmurs or rubs, no peripheral edema  Respiratory: clear to auscultation bilaterally, no wheezes, no crackles  Abdomen: soft, appropriately tender Skin: dry, no rashes  Musculoskeletal: no joint effusions, normal range of motion           Recent Labs and Imaging Reviewed:  Basic Metabolic Panel: No  results for input(s): "NA", "K", "CL", "CO2", "GLUCOSE", "BUN", "CREATININE", "CALCIUM ", "MG", "PHOS" in the last 168 hours. Liver Function Tests: No results for input(s): "AST", "ALT", "ALKPHOS", "BILITOT", "PROT", "ALBUMIN" in the last 168 hours. No results for input(s): "LIPASE", "AMYLASE" in the last 168 hours. No results for input(s): "AMMONIA" in the last 168 hours. CBC: No results for input(s): "WBC", "NEUTROABS", "HGB", "HCT", "MCV", "PLT" in the last 168 hours. Cardiac Enzymes: No results for input(s): "CKTOTAL", "CKMB", "CKMBINDEX", "TROPONINI" in the last 168 hours.  BNP (last 3 results) Recent Labs    03/03/23 1538  BNP 136.1*    ProBNP (last 3 results) No results for input(s): "PROBNP" in the last 8760 hours.  CBG: No results for input(s): "GLUCAP" in the last 168 hours.  Radiological Exams on Admission: No results found.  Summary and Recommendations: SHERREL PLOCH is a 68 y.o. female with medical history significant for COPD on room air, GERD, hypertension, hyperlipidemia, hypothyroidism, paroxysmal atrial fibrillation who was admitted to the surgical service today for an elective laparoscopic right hemicolectomy performed without complication by Dr. Camilo Cella and is now being seen by the hospitalist service for medical management.    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 daily x 1 week with plan to discontinue thereafter -Continue Toprol -XL -Resume Xarelto  when okay with surgical team  Hypothyroidism-Synthroid   Thank you for involving us  in the care of your patient.  [facility] will continue to follow along with you.  Time spent: 58 minutes  Malka Bocek Rickey Charm MD [facility] Pager [phone]  If 7PM-7AM, please contact night-coverage [email or web address] Password [email or web address]  05/25/2023,  12:54 PM

## 2023-05-25 NOTE — Progress Notes (Signed)
 Per Pharmacist order 10 mg IV Compazine q 6 hours.

## 2023-05-26 ENCOUNTER — Encounter (HOSPITAL_COMMUNITY): Payer: Self-pay | Admitting: Surgery

## 2023-05-26 DIAGNOSIS — Z9049 Acquired absence of other specified parts of digestive tract: Secondary | ICD-10-CM | POA: Diagnosis not present

## 2023-05-26 DIAGNOSIS — R739 Hyperglycemia, unspecified: Secondary | ICD-10-CM

## 2023-05-26 DIAGNOSIS — R7303 Prediabetes: Secondary | ICD-10-CM | POA: Diagnosis not present

## 2023-05-26 DIAGNOSIS — E034 Atrophy of thyroid (acquired): Secondary | ICD-10-CM

## 2023-05-26 LAB — CBC
HCT: 31.7 % — ABNORMAL LOW (ref 36.0–46.0)
Hemoglobin: 9.6 g/dL — ABNORMAL LOW (ref 12.0–15.0)
MCH: 26.7 pg (ref 26.0–34.0)
MCHC: 30.3 g/dL (ref 30.0–36.0)
MCV: 88.3 fL (ref 80.0–100.0)
Platelets: 321 10*3/uL (ref 150–400)
RBC: 3.59 MIL/uL — ABNORMAL LOW (ref 3.87–5.11)
RDW: 17.1 % — ABNORMAL HIGH (ref 11.5–15.5)
WBC: 9.6 10*3/uL (ref 4.0–10.5)
nRBC: 0 % (ref 0.0–0.2)

## 2023-05-26 LAB — BASIC METABOLIC PANEL WITH GFR
Anion gap: 10 (ref 5–15)
BUN: 10 mg/dL (ref 8–23)
CO2: 20 mmol/L — ABNORMAL LOW (ref 22–32)
Calcium: 8.4 mg/dL — ABNORMAL LOW (ref 8.9–10.3)
Chloride: 105 mmol/L (ref 98–111)
Creatinine, Ser: 1.09 mg/dL — ABNORMAL HIGH (ref 0.44–1.00)
GFR, Estimated: 56 mL/min — ABNORMAL LOW (ref >60)
Glucose, Bld: 157 mg/dL — ABNORMAL HIGH (ref 70–99)
Potassium: 4.4 mmol/L (ref 3.5–5.1)
Sodium: 135 mmol/L (ref 135–145)

## 2023-05-26 LAB — GLUCOSE, CAPILLARY
Glucose-Capillary: 111 mg/dL — ABNORMAL HIGH (ref 70–99)
Glucose-Capillary: 118 mg/dL — ABNORMAL HIGH (ref 70–99)

## 2023-05-26 MED ORDER — INSULIN ASPART 100 UNIT/ML IJ SOLN: 0.0000 [IU] | Freq: Three times a day (TID) | INTRAMUSCULAR | Status: DC

## 2023-05-26 MED ORDER — TRAMADOL HCL 50 MG PO TABS: 50.0000 mg | ORAL_TABLET | Freq: Four times a day (QID) | ORAL | 0 refills | Status: AC | PRN

## 2023-05-26 NOTE — Progress Notes (Signed)
  Progress Note   Patient: Dominique Barber NWG:[id number] DOB: [birth year] DOA: 05/25/2023     1 DOS: the patient was seen and examined on 05/26/2023   Brief hospital course: 68 year old woman PMH including COPD without supplemental oxygen need, paroxysmal atrial fibrillation, who underwent elective right hemicolectomy for treatment of cecal adenocarcinoma.  Hospitalist were consulted postoperatively for medical management.  Attending [facility]  Procedures/Events Right hemicolectomy   Assessment and Plan: Status post right hemicolectomy Cecal adenocarcinoma Management per primary team   Paroxysmal atrial fibrillation Continue amiodarone  200 mg p.o. daily x 1 week postop Continue Toprol -XL Resume Xarelto  when okay with surgical team  Hyperglycemia Prediabetes Hemoglobin A1c 6.09 November 2022.  I do not see that this has been addressed as an outpatient. Technically meets definition for diabetes.  For now classified as prediabetes. Recommend lifestyle modification and close follow-up with PCP. Recommend metformin on discharge.   Hypothyroidism Continue Synthroid       Subjective:  Feels okay  Physical Exam: Vitals:   05/26/23 0147 05/26/23 0556 05/26/23 0922 05/26/23 1318  BP: 126/83 (!) 129/95 (!) 116/90 131/84  Pulse:  (!) 108 60 (!) 108  Resp: 18 18 16 17   Temp: 97.9 F (36.6 C) 98.1 F (36.7 C)  (!) 97.4 F (36.3 C)  TempSrc: Oral Oral  Oral  SpO2: 99% 96% 91% 93%  Weight:  78.3 kg    Height:  5\' 2"  (1.575 m)     Physical Exam Vitals reviewed.  Constitutional:      General: She is not in acute distress.    Appearance: She is not ill-appearing or toxic-appearing.  Cardiovascular:     Rate and Rhythm: Normal rate. Rhythm irregular.     Heart sounds: No murmur heard. Pulmonary:     Effort: Pulmonary effort is normal. No respiratory distress.     Breath sounds: No wheezing, rhonchi or rales.  Neurological:     Mental Status: She is alert.  Psychiatric:         Mood and Affect: Mood normal.        Behavior: Behavior normal.    Data Reviewed: Glucose 157 Creatinine stable 1.09  Family Communication: none  Disposition: Per surgery      Time spent: 25 minutes  Author: Jerline Moon, MD 05/26/2023 3:16 PM  For on call review [email or web address].

## 2023-05-26 NOTE — Discharge Instructions (Addendum)
 POST OP INSTRUCTIONS AFTER COLON SURGERY  DIET: Be sure to include lots of fluids daily to stay hydrated - 64oz of water per day (8, 8 oz glasses).  Avoid fast food or heavy meals for the first couple of weeks as your are more likely to get nauseated. Avoid raw/uncooked fruits or vegetables for the first 4 weeks (its ok to have these if they are blended into smoothie form). If you have fruits/vegetables, make sure they are cooked until soft enough to mash on the roof of your mouth and chew your food well. Otherwise, diet as tolerated.  Take your usually prescribed home medications unless otherwise directed.  PAIN CONTROL: Pain is best controlled by a usual combination of three different methods TOGETHER: Ice/Heat Over the counter pain medication Prescription pain medication Most patients will experience some swelling and bruising around the surgical site.  Ice packs or heating pads (30-60 minutes up to 6 times a day) will help. Some people prefer to use ice alone, heat alone, alternating between ice & heat.  Experiment to what works for you.  Swelling and bruising can take several weeks to resolve.   It is helpful to take an over-the-counter pain medication regularly for the first few weeks: Ibuprofen  (Motrin /Advil ) - 200mg  tabs - take 3 tabs (600mg ) every 6 hours as needed for pain (unless you have been directed previously to avoid NSAIDs/ibuprofen ) Acetaminophen  (Tylenol ) - you may take 650mg  every 6 hours as needed. You can take this with motrin  as they act differently on the body. If you are taking a narcotic pain medication that has acetaminophen  in it, do not take over the counter tylenol  at the same time. NOTE: You may take both of these medications together - most patients  find it most helpful when alternating between the two (i.e. Ibuprofen  at 6am, tylenol  at 9am, ibuprofen  at 12pm ..Aaron Aas) A  prescription for pain medication should be given to you upon discharge.  Take your pain medication as  prescribed if your pain is not adequatly controlled with the over-the-counter pain reliefs mentioned above.  Avoid getting constipated.  Between the surgery and the pain medications, it is common to experience some constipation.  Increasing fluid intake and taking a fiber supplement (such as Metamucil, Citrucel, FiberCon, MiraLax , etc) 1-2 times a day regularly will usually help prevent this problem from occurring.  A mild laxative (prune juice, Milk of Magnesia, MiraLax , etc) should be taken according to package directions if there are no bowel movements after 48 hours.    Dressing: Your incisions are covered in Dermabond which is like sterile superglue for the skin. This will come off on it's own in a couple weeks. It is waterproof and you may bathe normally starting the day after your surgery in a shower. Avoid baths/pools/lakes/oceans until your wounds have fully healed.  ACTIVITIES as tolerated:   Avoid heavy lifting (>10lbs or 1 gallon of milk) for the next 6 weeks. You may resume regular daily activities as tolerated--such as daily self-care, walking, climbing stairs--gradually increasing activities as tolerated.  If you can walk 30 minutes without difficulty, it is safe to try more intense activity such as jogging, treadmill, bicycling, low-impact aerobics.  DO NOT PUSH THROUGH PAIN.  Let pain be your guide: If it hurts to do something, don't do it. You may drive when you are no longer taking prescription pain medication, you can comfortably wear a seatbelt, and you can safely maneuver your car and apply brakes.  FOLLOW UP in our  office Please call [facility] at [phone] to set up an appointment to see your surgeon in the office for a follow-up appointment approximately 2 weeks after your surgery. Make sure that you call for this appointment the day you arrive home to insure a convenient appointment time.  9. If you have disability or family leave forms that need to be completed, you may have  them completed by your primary care physician's office; for return to work instructions, please ask our office staff and they will be happy to assist you in obtaining this documentation   When to call us  [phone]: Poor pain control Reactions / problems with new medications (rash/itching, etc)  Fever over 101.5 F (38.5 C) Inability to urinate Nausea/vomiting Worsening swelling or bruising Continued bleeding from incision. Increased pain, redness, or drainage from the incision  The clinic staff is available to answer your questions during regular business hours (8:30am-5pm).  Please don't hesitate to call and ask to speak to one of our nurses for clinical concerns.   A surgeon from [facility] is always on call at the hospitals   If you have a medical emergency, go to the nearest emergency room or call 911.  [facility] [address], [place], [place]  [address] MAIN: [phone] FAX: [phone] [email or web address]        Nutrition Post Hospital Stay Proper nutrition can help your body recover from illness and injury.   Foods and beverages high in protein, vitamins, and minerals help rebuild muscle loss, promote healing, & reduce fall risk.   In addition to eating healthy foods, a nutrition shake is an easy, delicious way to get the nutrition you need during and after your hospital stay  It is recommended that you continue to drink 2 bottles per day of:       Ensure High Protein/Boost High Protein for at least 1 month (30 days) after your hospital stay   Tips for adding a nutrition shake into your routine: As allowed, drink one with vitamins or medications instead of water or juice Enjoy one as a tasty mid-morning or afternoon snack Drink cold or make a milkshake out of it Drink one instead of milk with cereal or snacks Use as a coffee creamer   Available at the following grocery stores and  pharmacies:           * [employer] * [employer] * [employer]  * [employer]          * [employer] * [employer]  * Wal[facility]      * Target  * [employer]J's   * [facility]  * [employer]   * [facility] [phone]            For COUPONS visit: www.[email or web address]/join or [email or web address]   Suggested Substitutions Ensure Plus = Boost Plus = Carnation Breakfast Essentials = Boost Compact Ensure Active Clear = Boost Breeze Glucerna Shake = Boost Glucose Control = Carnation Breakfast Essentials SUGAR FREE

## 2023-05-26 NOTE — TOC Initial Note (Signed)
 Transition of Care [facility]) - Initial/Assessment Note    Patient Details  Name: Dominique Barber MRN: [id number] Date of Birth: Apr 05, 1955  Transition of [facility]) CM/SW Contact:    Dominique Ream, RN Phone Number: 05/26/2023, 4:25 PM  Clinical Narrative:                 Dominique Barber w/ pt and spouse Dominique, Barber [phone]) in room; pt says she lives at home; she plans to return at d/c; her husband will provide transportation; she verified insurance/PCP; pt denied SDOH risks; pt says she has cane, walker, and BSC; she does not have HH services or home oxygen; TOC will follow.  Expected Discharge Plan: Home/Self Care Barriers to Discharge: Continued Medical Work up   Patient Goals and CMS Choice Patient states their goals for this hospitalization and ongoing recovery are:: home CMS Medicare.gov Compare Post Acute Care list provided to:: Patient   [place] ownership interest in [facility].provided to:: Patient    Expected Discharge Plan and Services   Discharge Planning Services: CM Consult   Living arrangements for the past 2 months: Single Family Home                                      Prior Living Arrangements/Services Living arrangements for the past 2 months: Single Family Home Lives with:: Spouse Patient language and need for interpreter reviewed:: Yes Do you feel safe going back to the place where you live?: Yes      Need for Family Participation in Patient Care: Yes (Comment) Care giver support system in place?: Yes (comment) Current home services: DME (cane, walker, BSC) Criminal Activity/Legal Involvement Pertinent to Current Situation/Hospitalization: No - Comment as needed  Activities of Daily Living   ADL Screening (condition at time of admission) Independently performs ADLs?: Yes (appropriate for developmental age) Is the patient deaf or have difficulty hearing?: No Does the patient have difficulty seeing, even when  wearing glasses/contacts?: No Does the patient have difficulty concentrating, remembering, or making decisions?: No  Permission Sought/Granted Permission sought to share information with : Case Manager Permission granted to share information with : Yes, Verbal Permission Granted  Share Information with NAME: Case Manager     Permission granted to share info w Relationship: Dominique, Barber (spouse) [phone]     Emotional Assessment Appearance:: Appears stated age Attitude/Demeanor/Rapport: Gracious Affect (typically observed): Accepting Orientation: : Oriented to Self, Oriented to Place, Oriented to  Time, Oriented to Situation Alcohol / Substance Use: Not Applicable Psych Involvement: No (comment)  Admission diagnosis:  Adenocarcinoma, colon (HCC) [C18.9] S/P partial colectomy [Z90.49] Patient Active Problem List   Diagnosis Date Noted   Prediabetes 05/26/2023   Hyperglycemia 05/26/2023   S/P partial colectomy 05/25/2023   Myalgia due to statin 04/12/2023   Adenocarcinoma of cecum (HCC) 04/12/2023   Preop cardiovascular exam 04/12/2023   Hypokalemia 04/12/2023   IBS (irritable bowel syndrome) 04/12/2023   Hypercoagulable state due to paroxysmal atrial fibrillation (HCC) 12/12/2021   Uterine fibroid 12/23/2020   Abdominal bloating 04/15/2020   Coronary artery disease involving autologous vein coronary bypass graft with unstable angina pectoris (HCC) 01/08/2020   Ischemic cardiomyopathy 01/03/2020   Essential hypertension 06/14/2019   Persistent atrial fibrillation with rapid ventricular response (HCC) 05/14/2014   Paroxysmal atrial fibrillation (HCC); CHA2DS2-VASc Score 3. On Xarelto  05/04/2014   COPD (chronic obstructive pulmonary disease) (HCC) 05/04/2014  Hyperlipidemia with target low density lipoprotein (LDL) cholesterol less than 55 mg/dL    Tobacco abuse    Hypothyroid 12/03/2012   Multinodular goiter 12/03/2012   Native CAD- 100% LAD, s/p SVG-LAD after  failed LIMA-LAD 11/14/1996    Class: Chronic   S/P CABG x 2 07/03/1996   History of ST elevation (STEMI) myocardial infarction involving left anterior descending coronary artery (HCC) 01/19/1996   PCP:  Dominique Abo, MD Pharmacy:   [id number] - [place], [place] - [address] [place] [address] Phone: [phone] Fax: [phone]     Social Drivers of Health (SDOH) Social History: SDOH Screenings   Food Insecurity: No Food Insecurity (05/26/2023)  Housing: Low Risk  (05/26/2023)  Transportation Needs: No Transportation Needs (05/26/2023)  Utilities: Not At Risk (05/26/2023)  Alcohol Screen: Low Risk  (03/28/2023)  Depression (PHQ2-9): Low Risk  (03/28/2023)  Financial Resource Strain: Low Risk  (11/24/2022)  Physical Activity: Insufficiently Active (11/24/2022)  Social Connections: Unknown (05/25/2023)  Stress: No Stress Concern Present (11/24/2022)  Tobacco Use: Medium Risk (05/25/2023)  Health Literacy: Adequate Health Literacy (03/28/2023)   SDOH Interventions: Food Insecurity Interventions: Intervention Not Indicated, Inpatient TOC Housing Interventions: Intervention Not Indicated, Inpatient TOC Transportation Interventions: Intervention Not Indicated, Inpatient TOC Utilities Interventions: Intervention Not Indicated, Inpatient TOC   Readmission Risk Interventions    05/26/2023    4:20 PM  Readmission Risk Prevention Plan  Transportation Screening Complete  PCP or Specialist Appt within 5-7 Days Complete  Home Care Screening Complete  Medication Review (RN CM) Complete

## 2023-05-26 NOTE — Plan of Care (Signed)
  Problem: Bowel/Gastric: Goal: Gastrointestinal status for postoperative course will improve Outcome: Progressing   Problem: Respiratory: Goal: Respiratory status will improve Outcome: Progressing

## 2023-05-26 NOTE — Hospital Course (Addendum)
 68 year old woman PMH including COPD without supplemental oxygen need, paroxysmal atrial fibrillation, who underwent elective right hemicolectomy for treatment of cecal adenocarcinoma.  Hospitalist were consulted postoperatively for medical management.  Attending CCS  Procedures/Events Right hemicolectomy

## 2023-05-26 NOTE — Progress Notes (Signed)
 Subjective No acute events. Doing well. No flatus/BM yet. Ambulating. Pain controlled. No n/v; tolerating clears well  Objective: Vital signs in last 24 hours: Temp:  [97.6 F (36.4 C)-98.3 F (36.8 C)] 98.1 F (36.7 C) (05/23 0556) Pulse Rate:  [72-111] 108 (05/23 0556) Resp:  [13-18] 18 (05/23 0556) BP: (115-155)/(80-104) 129/95 (05/23 0556) SpO2:  [95 %-100 %] 96 % (05/23 0556) Weight:  [72 kg-78.3 kg] 78.3 kg (05/23 0556) Last BM Date : 05/24/23  Intake/Output from previous day: 05/22 0701 - 05/23 0700 In: 2029.4 [P.O.:180; I.V.:1749.4; IV Piggyback:100] Out: 1355 [Urine:1330; Blood:25] Intake/Output this shift: No intake/output data recorded.  Gen: NAD, comfortable CV: RRR Pulm: Normal work of breathing Abd: Soft, appropriate tenderness around incisions, nondistended, no rebound/guarding Ext: SCDs in place  Lab Results: CBC  Recent Labs    05/26/23 0436  WBC 9.6  HGB 9.6*  HCT 31.7*  PLT 321   BMET Recent Labs    05/26/23 0436  NA 135  K 4.4  CL 105  CO2 20*  GLUCOSE 157*  BUN 10  CREATININE 1.09*  CALCIUM  8.4*   PT/INR No results for input(s): "LABPROT", "INR" in the last 72 hours. ABG No results for input(s): "PHART", "HCO3" in the last 72 hours.  Invalid input(s): "PCO2", "PO2"  Studies/Results:  Anti-infectives: Anti-infectives (From admission, onward)    Start     Dose/Rate Route Frequency Ordered Stop   05/25/23 1400  neomycin (MYCIFRADIN) tablet 1,000 mg  Status:  Discontinued       Placed in "And" Linked Group   1,000 mg Oral 3 times per day 05/25/23 0835 05/25/23 0840   05/25/23 1400  metroNIDAZOLE (FLAGYL) tablet 1,000 mg  Status:  Discontinued       Placed in "And" Linked Group   1,000 mg Oral 3 times per day 05/25/23 0835 05/25/23 0840   05/25/23 0845  cefoTEtan (CEFOTAN) 2 g in sodium chloride  0.9 % 100 mL IVPB        2 g 200 mL/hr over 30 Minutes Intravenous On call to O.R. 05/25/23 0835 05/25/23 1022         Assessment/Plan: Patient Active Problem List   Diagnosis Date Noted   S/P partial colectomy 05/25/2023   Myalgia due to statin 04/12/2023   Adenocarcinoma of cecum (HCC) 04/12/2023   Preop cardiovascular exam 04/12/2023   Hypokalemia 04/12/2023   IBS (irritable bowel syndrome) 04/12/2023   Hypercoagulable state due to paroxysmal atrial fibrillation (HCC) 12/12/2021   Uterine fibroid 12/23/2020   Abdominal bloating 04/15/2020   Coronary artery disease involving autologous vein coronary bypass graft with unstable angina pectoris (HCC) 01/08/2020   Ischemic cardiomyopathy 01/03/2020   Essential hypertension 06/14/2019   Persistent atrial fibrillation with rapid ventricular response (HCC) 05/14/2014   Paroxysmal atrial fibrillation (HCC); CHA2DS2-VASc Score 3. On Xarelto  05/04/2014   COPD (chronic obstructive pulmonary disease) (HCC) 05/04/2014   Hyperlipidemia with target low density lipoprotein (LDL) cholesterol less than 55 mg/dL    Tobacco abuse    Hypothyroid 12/03/2012   Multinodular goiter 12/03/2012   Native CAD- 100% LAD, s/p SVG-LAD after failed LIMA-LAD 11/14/1996    Class: Chronic   S/P CABG x 2 07/03/1996   History of ST elevation (STEMI) myocardial infarction involving left anterior descending coronary artery (HCC) 01/19/1996   s/p Procedure(s): COLECTOMY, RIGHT, LAPAROSCOPIC LYSIS, ADHESIONS, LAPAROSCOPIC 05/25/2023  - We spent time reviewing her procedure, findings and plans. All questions were answered, she expressed understanding and agreement  - Doing well - Adv  to full liquids, ADAT to soft later today - Decrease MIVF to 50cc/hr - cont until reliably tolerating PO - Holding Xarelto  until hgb stable x 24 hrs and clearly tolerating soft diet - possibly restarting 5/24 vs 5/25. D/C SQH prior - Ambulate 5x/day; PT & OT to see - [facility] following for assistance in medical mgmt - appreciate their help with her -PPX: SQH, SCDs   LOS: 1 day   Dominique Lincoln,  MD [facility]

## 2023-05-26 NOTE — Evaluation (Signed)
 Occupational Therapy Evaluation Patient Details Name: Dominique Barber MRN: [id number] DOB: 1955-03-24 Today's Date: 05/26/2023   History of Present Illness   68 yo female  S/P laparoscopic  right COLECTOMYand   LYSIS of ADHESIONS n 5/22. PMH:CAD, CABG,PAF,ACS,COPD,IBS, GERD, Hypothyroid, HH, HTN.     Clinical Impressions Patient evaluated by Occupational Therapy with no further acute OT needs identified. All education has been completed and the patient has no further questions. Patient able to teach back strategies for energy conservation, has a reacher at home and husband present 24/7 for assist as needed with IADL's.  See below for any follow-up Occupational Therapy or equipment needs. OT is signing off. Thank you for this referral.      If plan is discharge home, recommend the following:   Assist for transportation     Functional Status Assessment   Patient has not had a recent decline in their functional status     Equipment Recommendations   None recommended by OT      Precautions/Restrictions   Precautions Precautions: Fall Precaution/Restrictions Comments: abdomen Restrictions Weight Bearing Restrictions Per Provider Order: No     Mobility Bed Mobility Overal bed mobility: Modified Independent             General bed mobility comments: used rails    Transfers Overall transfer level: Needs assistance Equipment used: None Transfers: Sit to/from Stand, Bed to chair/wheelchair/BSC Sit to Stand: Modified independent (Device/Increase time)     Step pivot transfers: Supervision            Balance Overall balance assessment: No apparent balance deficits (not formally assessed)                                         ADL either performed or assessed with clinical judgement   ADL Overall ADL's : Modified independent                                       General ADL Comments: able to perform figure 4 for  LB dressing and has reacher in home for assist     Vision Baseline Vision/History: 0 No visual deficits Ability to See in Adequate Light: 0 Adequate Patient Visual Report: No change from baseline Vision Assessment?: No apparent visual deficits;Wears glasses for reading     Perception         Praxis         Pertinent Vitals/Pain Pain Assessment Pain Assessment: 0-10 Pain Score: 5  Pain Location: HA, abdominal Pain Descriptors / Indicators: Aching, Discomfort, Grimacing, Guarding Pain Intervention(s): Premedicated before session, Repositioned, Limited activity within patient's tolerance     Extremity/Trunk Assessment Upper Extremity Assessment Upper Extremity Assessment: Right hand dominant;Overall [facility] for tasks assessed   Lower Extremity Assessment Lower Extremity Assessment: Overall WFL for tasks assessed   Cervical / Trunk Assessment Cervical / Trunk Assessment: Normal   Communication Communication Communication: No apparent difficulties   Cognition Arousal: Alert Behavior During Therapy: WFL for tasks assessed/performed Cognition: No apparent impairments                               Following commands: Intact       Cueing  General Comments   Cueing Techniques: Verbal cues  SpO2  remained 98-100% on RA           Home Living Family/patient expects to be discharged to:: Private residence Living Arrangements: Spouse/significant other;Children Available Help at Discharge: Family;Available PRN/intermittently;Available 24 hours/day Type of Home: House Home Access: Stairs to enter [employer] of Steps: 3 Entrance Stairs-Rails: Right;Left Home Layout: One level     Bathroom Shower/Tub: Tub/shower unit;Curtain;Sponge bathes at baseline   [employer]: Standard Bathroom Accessibility: Yes How Accessible: Accessible via walker Home Equipment: Adaptive equipment Adaptive Equipment: Reacher        Prior  Functioning/Environment Prior Level of Function : Independent/Modified Independent;Driving               ADLs Comments: sponge bathes     AM-PAC OT "6 Clicks" Daily Activity     Outcome Measure Help from another person eating meals?: None Help from another person taking care of personal grooming?: None Help from another person toileting, which includes using toliet, bedpan, or urinal?: None Help from another person bathing (including washing, rinsing, drying)?: None Help from another person to put on and taking off regular upper body clothing?: None Help from another person to put on and taking off regular lower body clothing?: None 6 Click Score: 24   End of Session Equipment Utilized During Treatment: Gait belt Nurse Communication: Mobility status  Activity Tolerance: Patient tolerated treatment well Patient left: in bed;with call bell/phone within reach;with bed alarm set                   Time: 1400-1420 OT Time Calculation (min): 20 min Charges:  OT General Charges $OT Visit: 1 Visit OT Evaluation $OT Eval Low Complexity: 1 Low Janai Brannigan OT/L Acute Rehabilitation Department  [phone]  05/26/2023, 4:21 PM

## 2023-05-26 NOTE — Evaluation (Signed)
 Physical Therapy Evaluation Patient Details Name: Dominique Barber MRN: [id number] DOB: 1955/06/15 Today's Date: 05/26/2023  History of Present Illness  68 yo female  S/P laparoscopic  right COLECTOMYand   LYSIS of ADHESIONS n 5/22. PMH:CAD, CABG,PAF,ACS,COPD,IBS, GERD, Hypothyroid, HH, HTN.  Clinical Impression  Pt admitted with above diagnosis.  Pt currently with functional limitations due to the deficits listed below (see PT Problem List). Pt will benefit from acute skilled PT to increase their independence and safety with mobility to allow discharge.     The patient ambulated x 100' pushing IV pole. Patient reports SOB, SPo2 95% on RA, HR 113. Patient should progress to return home with family support.       If plan is discharge home, recommend the following: A little help with bathing/dressing/bathroom;Assistance with cooking/housework;Assist for transportation;Help with stairs or ramp for entrance   Can travel by private vehicle        Equipment Recommendations None recommended by PT  Recommendations for Other Services       Functional Status Assessment Patient has had a recent decline in their functional status and demonstrates the ability to make significant improvements in function in a reasonable and predictable amount of time.     Precautions / Restrictions Precautions Precautions: Fall Precaution/Restrictions Comments: abdomen Restrictions Weight Bearing Restrictions Per Provider Order: No      Mobility  Bed Mobility Overal bed mobility: Needs Assistance Bed Mobility: Sidelying to Sit   Sidelying to sit: Used rails, Supervision            Transfers Overall transfer level: Needs assistance Equipment used: None Transfers: Sit to/from Stand Sit to Stand: Supervision                Ambulation/Gait Ambulation/Gait assistance: Supervision Gait Distance (Feet): 120 Feet Assistive device: IV Pole Gait Pattern/deviations: Step-through pattern        General Gait Details: slow speed  Stairs            Wheelchair Mobility     Tilt Bed    Modified Rankin (Stroke Patients Only)       Balance Overall balance assessment: No apparent balance deficits (not formally assessed)                                           Pertinent Vitals/Pain Pain Assessment Pain Assessment: 0-10 Pain Score: 7  Pain Location: abdomen Pain Descriptors / Indicators: Aching, Discomfort, Grimacing, Guarding Pain Intervention(s): Monitored during session, Patient requesting pain meds-RN notified    Home Living Family/patient expects to be discharged to:: Private residence Living Arrangements: Spouse/significant other;Children Available Help at Discharge: Family;Available PRN/intermittently Type of Home: House Home Access: Stairs to enter Entrance Stairs-Rails: Doctor, general practice of Steps: 3   Home Layout: One level Home Equipment: None      Prior Function Prior Level of Function : Independent/Modified Independent               ADLs Comments: sponge bathes     Extremity/Trunk Assessment   Upper Extremity Assessment Upper Extremity Assessment: Overall WFL for tasks assessed    Lower Extremity Assessment Lower Extremity Assessment: Overall WFL for tasks assessed    Cervical / Trunk Assessment Cervical / Trunk Assessment: Normal  Communication   Communication Communication: No apparent difficulties    Cognition Arousal: Alert Behavior During Therapy: WFL for tasks assessed/performed   PT -  Cognitive impairments: No apparent impairments                                 Cueing       General Comments      Exercises     Assessment/Plan    PT Assessment Patient needs continued PT services  PT Problem List Decreased activity tolerance;Decreased mobility;Pain       PT Treatment Interventions Therapeutic activities;DME instruction;Gait training;Functional mobility  training;Patient/family education    PT Goals (Current goals can be found in the Care Plan section)  Acute Rehab PT Goals Patient Stated Goal: no pain, go home PT Goal Formulation: With patient Time For Goal Achievement: 06/09/23 Potential to Achieve Goals: Good    Frequency Min 2X/week     Co-evaluation               AM-PAC PT "6 Clicks" Mobility  Outcome Measure Help needed turning from your back to your side while in a flat bed without using bedrails?: None Help needed moving from lying on your back to sitting on the side of a flat bed without using bedrails?: None Help needed moving to and from a bed to a chair (including a wheelchair)?: A Little Help needed standing up from a chair using your arms (e.g., wheelchair or bedside chair)?: A Little Help needed to walk in hospital room?: A Little Help needed climbing 3-5 steps with a railing? : A Little 6 Click Score: 20    End of Session Equipment Utilized During Treatment: Gait belt Activity Tolerance: Patient tolerated treatment well;Patient limited by pain Patient left: in chair;with call bell/phone within reach;with chair alarm set Nurse Communication: Mobility status;Patient requests pain meds PT Visit Diagnosis: Unsteadiness on feet (R26.81);Difficulty in walking, not elsewhere classified (R26.2)    Time: 9147-8295 PT Time Calculation (min) (ACUTE ONLY): 26 min   Charges:     PT Treatments $Gait Training: 8-22 mins PT General Charges $$ ACUTE PT VISIT: 1 Visit         Abelina Hoes PT Acute Rehabilitation Services Office [phone]   Dareen Ebbing 05/26/2023, 9:12 AM

## 2023-05-27 ENCOUNTER — Inpatient Hospital Stay (HOSPITAL_COMMUNITY)

## 2023-05-27 DIAGNOSIS — Z9049 Acquired absence of other specified parts of digestive tract: Secondary | ICD-10-CM | POA: Diagnosis not present

## 2023-05-27 DIAGNOSIS — E1165 Type 2 diabetes mellitus with hyperglycemia: Secondary | ICD-10-CM

## 2023-05-27 DIAGNOSIS — E034 Atrophy of thyroid (acquired): Secondary | ICD-10-CM | POA: Diagnosis not present

## 2023-05-27 DIAGNOSIS — C180 Malignant neoplasm of cecum: Secondary | ICD-10-CM | POA: Diagnosis not present

## 2023-05-27 LAB — CBC
HCT: 34.1 % — ABNORMAL LOW (ref 36.0–46.0)
Hemoglobin: 10.7 g/dL — ABNORMAL LOW (ref 12.0–15.0)
MCH: 27.2 pg (ref 26.0–34.0)
MCHC: 31.4 g/dL (ref 30.0–36.0)
MCV: 86.8 fL (ref 80.0–100.0)
Platelets: 346 10*3/uL (ref 150–400)
RBC: 3.93 MIL/uL (ref 3.87–5.11)
RDW: 17.1 % — ABNORMAL HIGH (ref 11.5–15.5)
WBC: 11.9 10*3/uL — ABNORMAL HIGH (ref 4.0–10.5)
nRBC: 0 % (ref 0.0–0.2)

## 2023-05-27 LAB — GLUCOSE, CAPILLARY
Glucose-Capillary: 134 mg/dL — ABNORMAL HIGH (ref 70–99)
Glucose-Capillary: 133 mg/dL — ABNORMAL HIGH (ref 70–99)
Glucose-Capillary: 145 mg/dL — ABNORMAL HIGH (ref 70–99)
Glucose-Capillary: 118 mg/dL — ABNORMAL HIGH (ref 70–99)

## 2023-05-27 LAB — MAGNESIUM: Magnesium: 2.1 mg/dL (ref 1.7–2.4)

## 2023-05-27 LAB — HEMOGLOBIN A1C
Hgb A1c MFr Bld: 6.3 % — ABNORMAL HIGH (ref 4.8–5.6)
Mean Plasma Glucose: 134.11 mg/dL

## 2023-05-27 LAB — BASIC METABOLIC PANEL WITH GFR
Anion gap: 10 (ref 5–15)
BUN: 14 mg/dL (ref 8–23)
CO2: 22 mmol/L (ref 22–32)
Calcium: 8.6 mg/dL — ABNORMAL LOW (ref 8.9–10.3)
Chloride: 98 mmol/L (ref 98–111)
Creatinine, Ser: 1.15 mg/dL — ABNORMAL HIGH (ref 0.44–1.00)
GFR, Estimated: 52 mL/min — ABNORMAL LOW (ref >60)
Glucose, Bld: 131 mg/dL — ABNORMAL HIGH (ref 70–99)
Potassium: 3.4 mmol/L — ABNORMAL LOW (ref 3.5–5.1)
Sodium: 130 mmol/L — ABNORMAL LOW (ref 135–145)

## 2023-05-27 MED ORDER — POTASSIUM CHLORIDE CRYS ER 20 MEQ PO TBCR
60.0000 meq | EXTENDED_RELEASE_TABLET | Freq: Once | ORAL | Status: DC
Start: 2023-05-27 — End: 2023-05-27
  Filled 2023-05-27: qty 3

## 2023-05-27 MED ORDER — POTASSIUM CHLORIDE 10 MEQ/100ML IV SOLN
10.0000 meq | INTRAVENOUS | Status: AC
  Administered 2023-05-27 (×4): 10 meq via INTRAVENOUS
  Filled 2023-05-27 (×4): qty 100

## 2023-05-27 MED ORDER — LIDOCAINE VISCOUS HCL 2 % MT SOLN
15.0000 mL | Freq: Four times a day (QID) | OROMUCOSAL | Status: DC | PRN
  Administered 2023-05-27 – 2023-06-01 (×4): 15 mL via OROMUCOSAL
  Filled 2023-05-27 (×8): qty 15

## 2023-05-27 MED ORDER — PHENOL 1.4 % MT LIQD: 1.0000 | OROMUCOSAL | Status: DC | PRN

## 2023-05-27 MED ORDER — CARMEX CLASSIC LIP BALM EX OINT: TOPICAL_OINTMENT | CUTANEOUS | Status: DC | PRN

## 2023-05-27 MED ORDER — METOPROLOL TARTRATE 5 MG/5ML IV SOLN
5.0000 mg | Freq: Three times a day (TID) | INTRAVENOUS | Status: DC
  Administered 2023-05-27 (×2): 5 mg via INTRAVENOUS
  Filled 2023-05-27 (×2): qty 5

## 2023-05-27 MED ORDER — POTASSIUM CHLORIDE IN NACL 20-0.9 MEQ/L-% IV SOLN
INTRAVENOUS | Status: DC
  Filled 2023-05-27 (×2): qty 1000

## 2023-05-27 MED ORDER — SIMETHICONE 80 MG PO CHEW
40.0000 mg | CHEWABLE_TABLET | Freq: Four times a day (QID) | ORAL | Status: DC | PRN
  Administered 2023-05-27 – 2023-06-08 (×9): 40 mg via ORAL
  Filled 2023-05-27 (×9): qty 1

## 2023-05-27 NOTE — Progress Notes (Signed)
 Mobility Specialist - Progress Note   05/27/23 0913  Mobility  Activity Ambulated with assistance in hallway  Level of Assistance Modified independent, requires aide device or extra time  Assistive Device Other (Comment) (IV Pole)  Distance Ambulated (ft) 240 ft  Activity Response Tolerated well  Mobility Referral Yes  Mobility visit 1 Mobility  Mobility Specialist Start Time (ACUTE ONLY) [id number]  Mobility Specialist Stop Time (ACUTE ONLY) 0912  Mobility Specialist Time Calculation (min) (ACUTE ONLY) 9 min   Pt received in recliner and agreeable to mobility. Distance limited d/t pain. Pt to recliner after session with all needs met.    [facility]

## 2023-05-27 NOTE — Progress Notes (Signed)
 NG tube cont to drain greenish liquid w chunks of food sediment. Flushed both NG tube and connecting tubing several times to maintain patency. Pt stated that she had green beans and carrots last night for supper. Pt asking when the tube can be removed, instructed it must stay in place at least overnight.

## 2023-05-27 NOTE — Progress Notes (Signed)
 Noted pt a yellow mews w pulse up to 130's and + hx of Afib this afternoon. Checking pt and abd cont to be very distended, firm to touch. Pt complains of constant nausea, bloating and pressure. Drs Afton Horse and Gonfa notified of above and orders received. Just prior to inserting NG tube pt vomited 200 cc's greenish material. Inserted # 18 Fr NG w return of 1150 cc's greenish material w clumps. Pt tol well. Now dose of IV Metoprolol  given and pt placed on telemetry.

## 2023-05-27 NOTE — Progress Notes (Signed)
 Called to see patient due to distention.  She does have abdominal stanchion and nausea.  She is retching.  Her films look like she has an ileus postop.  She is still moving her bowels.  Heart rates in the 130s but she has a history of atrial fibrillation.  Medicine is following of asked the nurse to contact medicine to evaluate her heart rhythm to see if she needs any medical management of that secondary to gastric distention and exacerbation of her atrial fibrillation.  Place NG tube.  N.p.o. status.  No signs of peritonitis on exam.

## 2023-05-27 NOTE — Progress Notes (Signed)
 Subjective Had a difficult night with about 7 episodes of diarrhea.  Reports upper abdominal pain, indigestion and nausea this morning as well as bloating.   Objective: Vital signs in last 24 hours: Temp:  [97.4 F (36.3 C)-98.1 F (36.7 C)] 98 F (36.7 C) (05/24 0602) Pulse Rate:  [60-123] 88 (05/24 0602) Resp:  [16-18] 18 (05/24 0602) BP: (108-131)/(63-95) 128/95 (05/24 0602) SpO2:  [91 %-97 %] 97 % (05/24 0602) Weight:  [79.5 kg] 79.5 kg (05/24 0602) Last BM Date : 05/26/23 (per nurse report)  Intake/Output from previous day: 05/23 0701 - 05/24 0700 In: 2428.2 [P.O.:1080; I.V.:1348.2] Out: 575 [Urine:575] Intake/Output this shift: No intake/output data recorded.  Gen: NAD CV: RRR, some tachycardia noted yesterday afternoon and yesterday evening. Pulm: Normal work of breathing Abd: Soft but distended with edematous abdominal wall; evolving faint ecchymosis surrounding midline incision, mildly diffusely tender more concentrated in the epigastrium Ext: SCDs in place  Lab Results: CBC  Recent Labs    05/26/23 0436 05/27/23 0553  WBC 9.6 11.9*  HGB 9.6* 10.7*  HCT 31.7* 34.1*  PLT 321 346   BMET Recent Labs    05/26/23 0436 05/27/23 0553  NA 135 130*  K 4.4 3.4*  CL 105 98  CO2 20* 22  GLUCOSE 157* 131*  BUN 10 14  CREATININE 1.09* 1.15*  CALCIUM  8.4* 8.6*   PT/INR No results for input(s): "LABPROT", "INR" in the last 72 hours. ABG No results for input(s): "PHART", "HCO3" in the last 72 hours.  Invalid input(s): "PCO2", "PO2"  Studies/Results:  Anti-infectives: Anti-infectives (From admission, onward)    Start     Dose/Rate Route Frequency Ordered Stop   05/25/23 1400  neomycin (MYCIFRADIN) tablet 1,000 mg  Status:  Discontinued       Placed in "And" Linked Group   1,000 mg Oral 3 times per day 05/25/23 0835 05/25/23 0840   05/25/23 1400  metroNIDAZOLE (FLAGYL) tablet 1,000 mg  Status:  Discontinued       Placed in "And" Linked Group   1,000 mg  Oral 3 times per day 05/25/23 0835 05/25/23 0840   05/25/23 0845  cefoTEtan (CEFOTAN) 2 g in sodium chloride  0.9 % 100 mL IVPB        2 g 200 mL/hr over 30 Minutes Intravenous On call to O.R. 05/25/23 0835 05/26/23 0809        Assessment/Plan: Patient Active Problem List   Diagnosis Date Noted   Prediabetes 05/26/2023   Hyperglycemia 05/26/2023   S/P partial colectomy 05/25/2023   Myalgia due to statin 04/12/2023   Adenocarcinoma of cecum (HCC) 04/12/2023   Preop cardiovascular exam 04/12/2023   Hypokalemia 04/12/2023   IBS (irritable bowel syndrome) 04/12/2023   Hypercoagulable state due to paroxysmal atrial fibrillation (HCC) 12/12/2021   Uterine fibroid 12/23/2020   Abdominal bloating 04/15/2020   Coronary artery disease involving autologous vein coronary bypass graft with unstable angina pectoris (HCC) 01/08/2020   Ischemic cardiomyopathy 01/03/2020   Essential hypertension 06/14/2019   Persistent atrial fibrillation with rapid ventricular response (HCC) 05/14/2014   Paroxysmal atrial fibrillation (HCC); CHA2DS2-VASc Score 3. On Xarelto  05/04/2014   COPD (chronic obstructive pulmonary disease) (HCC) 05/04/2014   Hyperlipidemia with target low density lipoprotein (LDL) cholesterol less than 55 mg/dL    Tobacco abuse    Hypothyroid 12/03/2012   Multinodular goiter 12/03/2012   Native CAD- 100% LAD, s/p SVG-LAD after failed LIMA-LAD 11/14/1996    Class: Chronic   S/P CABG x 2 07/03/1996  History of ST elevation (STEMI) myocardial infarction involving left anterior descending coronary artery (HCC) 01/19/1996   s/p Procedure(s): COLECTOMY, RIGHT, LAPAROSCOPIC LYSIS, ADHESIONS, LAPAROSCOPIC 05/25/2023  -Abdominal pain, bloating, nausea and diarrhea noted overnight and this morning.  Lab work appears hemoconcentrated with increased hemoglobin, very slight increase in creatinine and hyponatremia, hypokalemia, slight rise in WBC  - Back diet down to clears, may need increased  IVF (currently@50 )  - Check abdominal plain films today   - Holding Xarelto  until hgb stable x 24 hrs and clearly tolerating soft diet - possibly restarting 5/25. D/C SQH prior - Ambulate 5x/day; PT & OT to see - [facility] following for assistance in medical mgmt - appreciate their help with her -PPX: [facility], SCDs   LOS: 2 days   Adalberto Acton  MD [facility]

## 2023-05-27 NOTE — Plan of Care (Signed)
   Problem: Education: Goal: Understanding of discharge needs will improve Outcome: Progressing Goal: Verbalization of understanding of the causes of altered bowel function will improve Outcome: Progressing   Problem: Activity: Goal: Ability to tolerate increased activity will improve Outcome: Progressing   Problem: Bowel/Gastric: Goal: Gastrointestinal status for postoperative course will improve Outcome: Progressing   Problem: Health Behavior/Discharge Planning: Goal: Identification of community resources to assist with postoperative recovery needs will improve Outcome: Progressing   Problem: Nutritional: Goal: Will attain and maintain optimal nutritional status will improve Outcome: Progressing   Problem: Clinical Measurements: Goal: Postoperative complications will be avoided or minimized Outcome: Progressing   Problem: Respiratory: Goal: Respiratory status will improve Outcome: Progressing   Problem: Skin Integrity: Goal: Will show signs of wound healing Outcome: Progressing   Problem: Education: Goal: Knowledge of General Education information will improve Description: Including pain rating scale, medication(s)/side effects and non-pharmacologic comfort measures Outcome: Progressing   Problem: Health Behavior/Discharge Planning: Goal: Ability to manage health-related needs will improve Outcome: Progressing   Problem: Clinical Measurements: Goal: Ability to maintain clinical measurements within normal limits will improve Outcome: Progressing Goal: Will remain free from infection Outcome: Progressing Goal: Diagnostic test results will improve Outcome: Progressing Goal: Respiratory complications will improve Outcome: Progressing Goal: Cardiovascular complication will be avoided Outcome: Progressing   Problem: Activity: Goal: Risk for activity intolerance will decrease Outcome: Progressing   Problem: Nutrition: Goal: Adequate nutrition will be  maintained Outcome: Progressing   Problem: Coping: Goal: Level of anxiety will decrease Outcome: Progressing   Problem: Elimination: Goal: Will not experience complications related to bowel motility Outcome: Progressing Goal: Will not experience complications related to urinary retention Outcome: Progressing   Problem: Pain Managment: Goal: General experience of comfort will improve and/or be controlled Outcome: Progressing   Problem: Safety: Goal: Ability to remain free from injury will improve Outcome: Progressing   Problem: Skin Integrity: Goal: Risk for impaired skin integrity will decrease Outcome: Progressing   Problem: Education: Goal: Ability to describe self-care measures that may prevent or decrease complications (Diabetes Survival Skills Education) will improve Outcome: Progressing Goal: Individualized Educational Video(s) Outcome: Progressing   Problem: Coping: Goal: Ability to adjust to condition or change in health will improve Outcome: Progressing   Problem: Fluid Volume: Goal: Ability to maintain a balanced intake and output will improve Outcome: Progressing   Problem: Health Behavior/Discharge Planning: Goal: Ability to identify and utilize available resources and services will improve Outcome: Progressing Goal: Ability to manage health-related needs will improve Outcome: Progressing   Problem: Metabolic: Goal: Ability to maintain appropriate glucose levels will improve Outcome: Progressing   Problem: Nutritional: Goal: Maintenance of adequate nutrition will improve Outcome: Progressing Goal: Progress toward achieving an optimal weight will improve Outcome: Progressing   Problem: Skin Integrity: Goal: Risk for impaired skin integrity will decrease Outcome: Progressing   Problem: Tissue Perfusion: Goal: Adequacy of tissue perfusion will improve Outcome: Progressing

## 2023-05-27 NOTE — Progress Notes (Signed)
  Progress Note   Patient: Dominique Barber NGE:[id number] DOB: 1955-09-14 DOA: 05/25/2023     2 DOS: the patient was seen and examined on 05/27/2023   Brief hospital course: 68 year old woman PMH including COPD without supplemental oxygen need, paroxysmal atrial fibrillation, who underwent elective right hemicolectomy for treatment of cecal adenocarcinoma.  Hospitalist were consulted postoperatively for medical management.  Attending CCS  Procedures/Events Right hemicolectomy   Assessment and Plan: Status post right hemicolectomy Cecal adenocarcinoma Management per primary team Mobilize patient.  Abdominal pain/diarrhea: Diarrhea likely due to hemicolectomy, supplements.  KUB raises concern for ileus or partial SBO. Consider holding supplement   Paroxysmal atrial fibrillation Continue amiodarone  200 mg p.o. daily x 1 week postop Continue Toprol -XL Recommend resuming Xarelto  when feasible from surgical standpoint  NIDDM-2 with hyperglycemia: A1c 6.6% in 2024.  CBG ranges from 110s to 130s. Continue SSI-very sensitive Recheck hemoglobin A1c Not a great candidate for metformin given colectomy and diarrhea.   Hypothyroidism Continue Synthroid   Hypokalemia Monitor replenish as appropriate  Leukocytosis: Likely demargination Continue monitoring  Hyponatremia Monitor        Subjective:  Reports rough night.  Was unable to sleep.  Had watery diarrhea x 7.  Feels gassy and nauseous.  Also reports headache.   Physical Exam: Vitals:   05/26/23 1318 05/26/23 2137 05/27/23 0233 05/27/23 0602  BP: 131/84 125/87 108/63 (!) 128/95  Pulse: (!) 108 (!) 123 91 88  Resp: 17  18 18   Temp: (!) 97.4 F (36.3 C) 98.1 F (36.7 C) 97.7 F (36.5 C) 98 F (36.7 C)  TempSrc: Oral Oral Oral Oral  SpO2: 93% 96%  97%  Weight:    79.5 kg  Height:       GENERAL: No apparent distress.  Nontoxic. HEENT: MMM.  Vision and hearing grossly intact.  NECK: Supple.  No apparent JVD.   RESP:  No IWOB.  Fair aeration bilaterally. CVS: Irregular rhythm.  Normal rate.  Heart sounds normal.  ABD/GI/GU: BS+. Abd somewhat distended and firm.  Tender to palpation mainly over right side. MSK/EXT:   No apparent deformity. Moves extremities. No edema.  SKIN: Laparoscopic wounds DCI. NEURO: Awake and alert. Oriented appropriately.  No apparent focal neuro deficit. PSYCH: Calm. Normal affect.  Data Reviewed: Glucose 133. Sodium 130.   Potassium 3.4. Creatinine 1.15, stable. WBC 11.9  Family Communication: none  Disposition: Per surgery      Time spent: 35 minutes  Author: Theadore Finger, MD 05/27/2023 1:47 PM  For on call review [email or web address].

## 2023-05-28 ENCOUNTER — Other Ambulatory Visit: Payer: Self-pay

## 2023-05-28 DIAGNOSIS — R7303 Prediabetes: Secondary | ICD-10-CM | POA: Diagnosis not present

## 2023-05-28 DIAGNOSIS — R739 Hyperglycemia, unspecified: Secondary | ICD-10-CM | POA: Diagnosis not present

## 2023-05-28 DIAGNOSIS — I2510 Atherosclerotic heart disease of native coronary artery without angina pectoris: Secondary | ICD-10-CM

## 2023-05-28 DIAGNOSIS — I10 Essential (primary) hypertension: Secondary | ICD-10-CM

## 2023-05-28 DIAGNOSIS — Z9049 Acquired absence of other specified parts of digestive tract: Secondary | ICD-10-CM | POA: Diagnosis not present

## 2023-05-28 DIAGNOSIS — I4891 Unspecified atrial fibrillation: Secondary | ICD-10-CM | POA: Diagnosis not present

## 2023-05-28 LAB — CBC
HCT: 34.3 % — ABNORMAL LOW (ref 36.0–46.0)
Hemoglobin: 10.6 g/dL — ABNORMAL LOW (ref 12.0–15.0)
MCH: 26.9 pg (ref 26.0–34.0)
MCHC: 30.9 g/dL (ref 30.0–36.0)
MCV: 87.1 fL (ref 80.0–100.0)
Platelets: 374 10*3/uL (ref 150–400)
RBC: 3.94 MIL/uL (ref 3.87–5.11)
RDW: 17.2 % — ABNORMAL HIGH (ref 11.5–15.5)
WBC: 11.8 10*3/uL — ABNORMAL HIGH (ref 4.0–10.5)
nRBC: 0.2 % (ref 0.0–0.2)

## 2023-05-28 LAB — HEPARIN LEVEL (UNFRACTIONATED): Heparin Unfractionated: 0.56 [IU]/mL (ref 0.30–0.70)

## 2023-05-28 LAB — BASIC METABOLIC PANEL WITH GFR
Anion gap: 8 (ref 5–15)
BUN: 16 mg/dL (ref 8–23)
CO2: 25 mmol/L (ref 22–32)
Calcium: 8.5 mg/dL — ABNORMAL LOW (ref 8.9–10.3)
Chloride: 98 mmol/L (ref 98–111)
Creatinine, Ser: 1.32 mg/dL — ABNORMAL HIGH (ref 0.44–1.00)
GFR, Estimated: 44 mL/min — ABNORMAL LOW (ref >60)
Glucose, Bld: 130 mg/dL — ABNORMAL HIGH (ref 70–99)
Potassium: 5.1 mmol/L (ref 3.5–5.1)
Sodium: 131 mmol/L — ABNORMAL LOW (ref 135–145)

## 2023-05-28 LAB — GLUCOSE, CAPILLARY
Glucose-Capillary: 109 mg/dL — ABNORMAL HIGH (ref 70–99)
Glucose-Capillary: 101 mg/dL — ABNORMAL HIGH (ref 70–99)
Glucose-Capillary: 97 mg/dL (ref 70–99)
Glucose-Capillary: 91 mg/dL (ref 70–99)

## 2023-05-28 LAB — PHOSPHORUS: Phosphorus: 2.7 mg/dL (ref 2.5–4.6)

## 2023-05-28 LAB — MAGNESIUM: Magnesium: 2.3 mg/dL (ref 1.7–2.4)

## 2023-05-28 MED ORDER — METHOCARBAMOL 1000 MG/10ML IJ SOLN
500.0000 mg | Freq: Four times a day (QID) | INTRAMUSCULAR | Status: DC | PRN
  Administered 2023-05-31: 500 mg via INTRAVENOUS
  Filled 2023-05-28: qty 10

## 2023-05-28 MED ORDER — METOPROLOL TARTRATE 5 MG/5ML IV SOLN
10.0000 mg | Freq: Four times a day (QID) | INTRAVENOUS | Status: DC
  Administered 2023-05-28: 10 mg via INTRAVENOUS
  Filled 2023-05-28: qty 10

## 2023-05-28 MED ORDER — METOPROLOL TARTRATE 5 MG/5ML IV SOLN: 10.0000 mg | Freq: Three times a day (TID) | INTRAVENOUS | Status: DC

## 2023-05-28 MED ORDER — DILTIAZEM HCL-DEXTROSE 125-5 MG/125ML-% IV SOLN (PREMIX)
5.0000 mg/h | INTRAVENOUS | Status: DC
  Administered 2023-05-28 – 2023-05-29 (×3): 5 mg/h via INTRAVENOUS
  Administered 2023-05-30: 10 mg/h via INTRAVENOUS
  Administered 2023-05-30: 12.5 mg/h via INTRAVENOUS
  Administered 2023-05-30 – 2023-06-01 (×5): 15 mg/h via INTRAVENOUS
  Administered 2023-06-01 – 2023-06-02 (×3): 12.5 mg/h via INTRAVENOUS
  Administered 2023-06-03: 7.5 mg/h via INTRAVENOUS
  Administered 2023-06-03: 10 mg/h via INTRAVENOUS
  Administered 2023-06-04 – 2023-06-05 (×2): 7.5 mg/h via INTRAVENOUS
  Administered 2023-06-05: 10 mg/h via INTRAVENOUS
  Administered 2023-06-06: 7.5 mg/h via INTRAVENOUS
  Filled 2023-05-28 (×23): qty 125

## 2023-05-28 MED ORDER — ACETAMINOPHEN 10 MG/ML IV SOLN
1000.0000 mg | Freq: Four times a day (QID) | INTRAVENOUS | Status: AC
  Administered 2023-05-28 – 2023-05-29 (×4): 1000 mg via INTRAVENOUS
  Filled 2023-05-28 (×4): qty 100

## 2023-05-28 MED ORDER — CHLORHEXIDINE GLUCONATE CLOTH 2 % EX PADS
6.0000 | MEDICATED_PAD | Freq: Every day | CUTANEOUS | Status: DC
  Administered 2023-05-28 – 2023-06-07 (×11): 6 via TOPICAL

## 2023-05-28 MED ORDER — METOPROLOL TARTRATE 5 MG/5ML IV SOLN: 5.0000 mg | Freq: Once | INTRAVENOUS | Status: DC

## 2023-05-28 MED ORDER — SODIUM CHLORIDE 0.9 % IV SOLN: INTRAVENOUS | Status: AC

## 2023-05-28 MED ORDER — METOPROLOL TARTRATE 5 MG/5ML IV SOLN
5.0000 mg | Freq: Four times a day (QID) | INTRAVENOUS | Status: DC
Start: 2023-05-28 — End: 2023-05-28

## 2023-05-28 MED ORDER — METOPROLOL TARTRATE 5 MG/5ML IV SOLN: 10.0000 mg | Freq: Four times a day (QID) | INTRAVENOUS | Status: DC

## 2023-05-28 MED ORDER — HEPARIN (PORCINE) 25000 UT/250ML-% IV SOLN
1000.0000 [IU]/h | INTRAVENOUS | Status: DC
  Administered 2023-05-28: 1000 [IU]/h via INTRAVENOUS
  Filled 2023-05-28: qty 250

## 2023-05-28 MED ORDER — AMIODARONE HCL IN DEXTROSE 360-4.14 MG/200ML-% IV SOLN
30.0000 mg/h | INTRAVENOUS | Status: DC
  Filled 2023-05-28: qty 200

## 2023-05-28 NOTE — Progress Notes (Signed)
 PHARMACY - ANTICOAGULATION CONSULT NOTE  Pharmacy Consult for Heparin  Indication: atrial fibrillation  Allergies  Allergen Reactions   Iodinated Contrast Media Hives and Dermatitis   Linzess [Linaclotide] Other (See Comments)    Caused horrible stomach pain and gas   Atorvastatin  Other (See Comments)    Gas pain in abdomen with no relief   Other Diarrhea and Other (See Comments)    IBS- Special diet   Milk-Related Compounds Diarrhea and Other (See Comments)    Has IBS   Rosuvastatin  Calcium  Other (See Comments)    Worsening GI upset   Simvastatin Other (See Comments)    Myalgia & GI upset   Eluxadoline Nausea And Vomiting and Other (See Comments)    Viberzi - stomach pain   Iohexol  Hives, Rash and Other (See Comments)   Marinol [Dronabinol] Palpitations and Other (See Comments)    "Made me feel high and almost passed out"   Red Dye #40 (Allura Red) Hives and Rash    Red contrast dye     Patient Measurements: Height: 5\' 2"  (157.5 cm) Weight: 76.6 kg (168 lb 14 oz) IBW/kg (Calculated) : 50.1 HEPARIN  DW (KG): 67.3  Vital Signs: Temp: 97.7 F (36.5 C) (05/25 0842) Temp Source: Oral (05/25 0842) BP: 144/83 (05/25 1022) Pulse Rate: 115 (05/25 1022)  Labs: Recent Labs    05/26/23 0436 05/27/23 0553 05/28/23 0502  HGB 9.6* 10.7* 10.6*  HCT 31.7* 34.1* 34.3*  PLT 321 346 374  CREATININE 1.09* 1.15* 1.32*    Estimated Creatinine Clearance: 39.6 mL/min (A) (by C-G formula based on SCr of 1.32 mg/dL (H)).   Medical History: Past Medical History:  Diagnosis Date   ACS (acute coronary syndrome) (HCC) 01/06/2020   With progressive angina while troponin elevation--borderline-NON-STEMI   ANTERIOR STEMI of LAD (x2). 01/19/1996   Complicated by VF arrest, and anoxic brain injury with status epilepticus; POBA of LAD --> 6 months later, after 2 vessel CABG she had postop complication with occluded LIMA/second anterior STEMI   CAD in native artery & Grafts 01/1996   a)  1/'98: Ant STEMI => LAD POBA; b) 2/'98: 95% prox LAD @ POBA site --> BMS PCI (3.0 mm x 25 mm); c) 7/98 (5 months later) => BMS ISR of LAD--> CABGx2 (LIMA-LAD, SVG-D1 -> Emergent redo w/ SVG-LAD b/c acute LIMA occlusion); d) 01/2020: ACS -> DES PCI of SVG-LAD (75%&85%), TO of SVG-D1.   COPD (chronic obstructive pulmonary disease) (HCC)    Dyspnea    GERD (gastroesophageal reflux disease)    Headache    Hiatal hernia    Hyperlipidemia    Hypertension    Hypothyroidism    On Synthroid    Iron deficiency anemia    Irritable bowel syndrome    Followed by Dr. Tami Falcon.   Ischemic cardiomyopathy 01/03/2020   ECHO (ACS): EF 45-50% (by Cath EF 40-45%) - Apical Inferior/Apical Anteroseptal & Apex akinetic, GR II DD.    PAF (paroxysmal atrial fibrillation) (HCC) 05/03/2014   New onset - originally with RVR   Pneumonia    x 2   PONV (postoperative nausea and vomiting)    very gag reflex   S/P CABG x 2; with EMERGENT Redo x 1. 07/1996   Initially LIMA-LAD, SVG-D1 --> immediate LIMA failure post CABG with anterior MI --> urgent redo SVG-LAD beyond D2.; Widely patent grafts as of 2005 with left dominant system. Graft to the diagonal branch has a very small target vessel but the vein graft to LAD retrograde fills  a large bifurcating D2 and antegrade fills a large D3.   Tobacco abuse     Assessment: 5/22: laparoscopic R colectomy with LOA  AC/Heme: Xarelto  PTA for afib. (LD unknown) SQ heparin  postop. Hgb 9.6>10.6 stable today - 5/25: IV heparin  ok per surgery for now with potential post-op ileus.  Goal of Therapy:  Heparin  level 0.3-0.7 units/ml Monitor platelets by anticoagulation protocol: Yes   Plan:  IV heparin  (no bolus, recent surgery) 1000 units/hr Check heparin  level in 6 hrs. Daily HL and CBC   Dominique Barber, PharmD, BCPS Clinical Staff Pharmacist Enis Harsh Stillinger 05/28/2023,10:59 AM

## 2023-05-28 NOTE — Progress Notes (Addendum)
 At bedside for PICC placement.  Husband also at bedside.  Discussed reason for PICC placement and all risks and benefits. Ph of diltiazem , lab draws for heparin  levels, risks with the PIV's.  Pt states she needs to think about it tonight.  Husband states he will research the PICC tonight as well.  Pt has PIV x2, both WNL. Will plan on PICC nurse on Monday to reassess if pt agreeable to placement.

## 2023-05-28 NOTE — Consult Note (Addendum)
 Cardiology Consultation   Patient ID: Dominique Barber MRN: [id number]; DOB: 29-Dec-1955  Admit date: 05/25/2023 Date of Consult: 05/28/2023  PCP:  Abraham Abo, MD   [facility] Providers Cardiologist:  Randene Bustard, MD   {    Patient Profile:   Dominique Barber is a 68 y.o. female with a hx of paroxysmal atrial fibrillation, CAD with history of VF arrest and subsequent CABG with SVG -LAD and SVG -D1 , ischemic cardiomyopathy, hyperlipidemia, type 2 diabetes, hypothyroidism, COPD, cecal adenocarcinoma, who is being seen 05/28/2023 for the evaluation of A-fib RVR at the request of Dr. Camilo Cella.  History of Present Illness:   Dominique Barber is above past medical history presented to [facility] on 05/25/2023 for newly discovered cecal adenocarcinoma.  She underwent laparoscopic right hemicolectomy by Dr. Camilo Cella on 05/25/2023.  Hospitalist has been consulted for postop management.  She was found in A-fib RVR.  Cardiology is consulted for further evaluation.  Per chart review, patient follows Dr. Addie Holstein outpatient, has CAD with remote anterior STEMI involving LAD, that was complicated by V-fib arrest and she underwent ultimate CABG with SVG to LAD after failed LIMA to LAD.  Most recent left heart catheterization was completed 01/08/2020 revealed 100% stenosis of proximal to mid LAD, normal native dominant left circumflex and nondominant RCA.  SVG to D1 graft  with proximal graft lesion is 100% stenosis, SVG to LAD graft with severe focal stenosis, she was treated with PTCA-DES PCI of SVG-LAD covering both lesions using a resolute Onyx DES 3.5 mm x 38 mm postdilated to 3.6 mm.  LVEF was mildly reduced 40 to 45% with anterior hypokinesis.  Moderately elevated PA with significant systemic hypertension.  Is historically managed by low-dose beta-blocker Toprol  25 mg, Repatha ; blood pressure is limiting GDMT for ischemic cardiomyopathy; intolerant to statin due to myalgia; not on  antithrombotic due to DOAC use for atrial fibrillation.   Most recent echo was completed 03/31/2023 revealing LVEF improved to 55 to 60%, no regional wall motion abnormality, elevated LA pressure, normal diastolic parameter, normal RV, trivial MR.   She was most recently seen on 04/11/23 by Dr. Addie Holstein, Xarelto  for atrial fibrillation was held due to GI bleeding.  She was recommended prophylactic amiodarone  with amiodarone  for upcoming colon surgery.  She was prescribed amiodarone  400 mg daily 1 week preoperatively and was advised to take 200 mg daily postoperatively.  She was recommended no further cardiovascular testing before colon surgery.  She was advised to resume Xarelto  when it is safe from GI standpoint postoperatively.      Past Medical History:  Diagnosis Date   ACS (acute coronary syndrome) (HCC) 01/06/2020   With progressive angina while troponin elevation--borderline-NON-STEMI   ANTERIOR STEMI of LAD (x2). 01/19/1996   Complicated by VF arrest, and anoxic brain injury with status epilepticus; POBA of LAD --> 6 months later, after 2 vessel CABG she had postop complication with occluded LIMA/second anterior STEMI   CAD in native artery & Grafts 01/1996   a) 1/'98: Ant STEMI => LAD POBA; b) 2/'98: 95% prox LAD @ POBA site --> BMS PCI (3.0 mm x 25 mm); c) 7/98 (5 months later) => BMS ISR of LAD--> CABGx2 (LIMA-LAD, SVG-D1 -> Emergent redo w/ SVG-LAD b/c acute LIMA occlusion); d) 01/2020: ACS -> DES PCI of SVG-LAD (75%&85%), TO of SVG-D1.   COPD (chronic obstructive pulmonary disease) (HCC)    Dyspnea    GERD (gastroesophageal reflux disease)    Headache  Hiatal hernia    Hyperlipidemia    Hypertension    Hypothyroidism    On Synthroid    Iron deficiency anemia    Irritable bowel syndrome    Followed by Dr. Tami Falcon.   Ischemic cardiomyopathy 01/03/2020   ECHO (ACS): EF 45-50% (by Cath EF 40-45%) - Apical Inferior/Apical Anteroseptal & Apex akinetic, GR II DD.    PAF  (paroxysmal atrial fibrillation) (HCC) 05/03/2014   New onset - originally with RVR   Pneumonia    x 2   PONV (postoperative nausea and vomiting)    very gag reflex   S/P CABG x 2; with EMERGENT Redo x 1. 07/1996   Initially LIMA-LAD, SVG-D1 --> immediate LIMA failure post CABG with anterior MI --> urgent redo SVG-LAD beyond D2.; Widely patent grafts as of 2005 with left dominant system. Graft to the diagonal branch has a very small target vessel but the vein graft to LAD retrograde fills a large bifurcating D2 and antegrade fills a large D3.   Tobacco abuse     Past Surgical History:  Procedure Laterality Date   APPENDECTOMY     BIOPSY  05/08/2020   Procedure: BIOPSY;  Surgeon: Alvis Jourdain, MD;  Location: [facility];  Service: Endoscopy;;   BREAST EXCISIONAL BIOPSY Right    CESAREAN SECTION     CHOLECYSTECTOMY     COLONOSCOPY N/A 03/17/2023   Procedure: COLONOSCOPY;  Surgeon: Alvis Jourdain, MD;  Location: [facility];  Service: Gastroenterology;  Laterality: N/A;  biopsy   CORONARY ANGIOPLASTY  01/19/1996   Acute anterior STEMI- LAD POBA:  p-m LAD 70-80% narrowing & focal 95% stenosis in distal 3rd --> Treated with Emergent PTCA( POBA)  (Dr. Savilla Curls   CORONARY ANGIOPLASTY WITH STENT PLACEMENT  02/26/1996   3.0x63mm Multi-Link stent to prox/mid LAD (stenosis at recent PTCA site) (Dr. Savilla Curls)   CORONARY ARTERY BYPASS GRAFT  07/31/1996   x2; LIMA to LAD and SVG to 1st diagonal (Dr. Sherrill Dolin)   CORONARY ARTERY BYPASS GRAFT  07/31/1996   x1; SVG to distal LAD (Dr. Sherrill Dolin)   CORONARY STENT INTERVENTION N/A 01/08/2020   Procedure: CORONARY STENT INTERVENTION;  Surgeon: Arleen Lacer, MD;  Location: [facility];; , SVG-LAD severe focal disease with 75% followed by 85% calcified stenosis--DES PCI (Resolute Onyx 3.5 mm x 38 mm - 3.6 mm)   ESOPHAGOGASTRODUODENOSCOPY N/A 03/17/2023   Procedure: EGD (ESOPHAGOGASTRODUODENOSCOPY);  Surgeon: Alvis Jourdain, MD;  Location: [facility];  Service: Gastroenterology;  Laterality: N/A;  biopsy   ESOPHAGOGASTRODUODENOSCOPY (EGD) WITH PROPOFOL  N/A 05/08/2020   Procedure: ESOPHAGOGASTRODUODENOSCOPY (EGD) WITH PROPOFOL ;  Surgeon: Alvis Jourdain, MD;  Location: [facility];  Service: Endoscopy;  Laterality: N/A;   LAPAROSCOPIC LYSIS OF ADHESIONS N/A 05/25/2023   Procedure: LYSIS, ADHESIONS, LAPAROSCOPIC;  Surgeon: Melvenia Stabs, MD;  Location: [facility];  Service: General;  Laterality: N/A;   LAPAROSCOPIC RIGHT COLECTOMY Right 05/25/2023   Procedure: COLECTOMY, RIGHT, LAPAROSCOPIC;  Surgeon: Melvenia Stabs, MD;  Location: [facility];  Service: General;  Laterality: Right;  LAPAROSCOPIC RIGHT HEMICOLECTOMY   LEFT HEART CATH AND CORONARY ANGIOGRAPHY  07/30/1996   normal L main, LAD w/95% stenosis just before stent, diffuse 80-85% end-stent restenosis, 1st diagonal with70-75% ostial stenosis, large optional diagonal that was normal, Cfx with 2 marginals and distal PLA (all normal), RCA normal (Dr. Savilla Curls)   LEFT HEART CATH AND CORONARY ANGIOGRAPHY  01/19/1996   acute anterior wall MI, anoxic encephalopahty, VF; LCfx free of disease  and dominant, RCA patent, L main short and patent, LAD with 70-80% narrowing and focal 95% stenosis in distal 3rd => PTCA  (Dr. Savilla Curls)   LEFT HEART CATH AND CORONARY ANGIOGRAPHY  01/18/2017   acute anterior wall MI, anoxic encephalopahty, VF; LCfx free of disease and dominant, RCA patent, L main short and patent, LAD with 70-80% narrowing and focal 95% stenosis in distal 3rd  (Dr. Savilla Curls)   LEFT HEART CATH AND CORS/GRAFTS ANGIOGRAPHY N/A 01/08/2020   Procedure: LEFT HEART CATH AND CORS/GRAFTS ANGIOGRAPHY;  Surgeon: Arleen Lacer, MD;  Location: [facility]; EF 40 to 45%. Mildly elevated LVEDP. Proximal LAD 100% occlusion, mid-distal LAD fills via SVG that has collaterals to D1. Normal dominant native LCx and nondominant RCA. SVG-D1 ostial 100% CTO, SVG-LAD severe focal  disease with 75% followed by 85% calcified stenosis--DES PCI   LEFT HEART CATH AND CORS/GRAFTS ANGIOGRAPHY  01/31/2003   LAD totally occluded in prox 3rd, patent Cfx, SVG to mid LAd patent, SVG to small DX1 patent, LIMA to LAD was atretic and essentially occluded in junction of prox & mid 3rd, R barciocephalic and subclavian were normal (Dr. Savilla Curls)   NM [facility] PERF WALL MOTION  10/2010; 06/2014   a) LexiScan  Cardiolite  - mild perfusion defect r/t infarct/scar with mild periifarct ischemia in mid anterior & apical anterior region; EF 67%; abnormal, low risk study;; b) Small, moderate intensity Fixed perfusion defect - mid Anterior Wall c/w known Anterior MI. LOW RISK    OVARIAN CYST REMOVAL     TRANSTHORACIC ECHOCARDIOGRAM  02/2012; 06/2014   a) EF 50-55%, mild HK of anterospetal myocardium; mild MR;; b) EF 55-60%, mild HK of apical anterior wall.   Mild MR   TRANSTHORACIC ECHOCARDIOGRAM  01/02/2020   EF 45 to 50%. Apical inferior, apical septal and apex akinetic. GRII DD. Otherwise normal valves, normal RV. Normal atria. Normal pressures.       Inpatient Medications: Scheduled Meds:  Chlorhexidine Gluconate Cloth  6 each Topical Daily   heparin  injection (subcutaneous)  5,000 Units Subcutaneous Q8H   insulin aspart  0-6 Units Subcutaneous TID WC   metoprolol  tartrate  10 mg Intravenous Q6H   Continuous Infusions:  sodium chloride  125 mL/hr at 05/28/23 1005   acetaminophen      PRN Meds: albuterol , diphenhydrAMINE  **OR** diphenhydrAMINE , hydrALAZINE , HYDROmorphone  (DILAUDID ) injection, lidocaine , lip balm, methocarbamol  (ROBAXIN ) injection, nitroGLYCERIN , ondansetron  **OR** ondansetron  (ZOFRAN ) IV, prochlorperazine, simethicone , traMADol  Allergies:    Allergies  Allergen Reactions   Iodinated Contrast Media Hives and Dermatitis   Linzess [Linaclotide] Other (See Comments)    Caused horrible stomach pain and gas   Atorvastatin  Other (See Comments)    Gas pain in abdomen with no  relief   Other Diarrhea and Other (See Comments)    IBS- Special diet   Milk-Related Compounds Diarrhea and Other (See Comments)    Has IBS   Rosuvastatin  Calcium  Other (See Comments)    Worsening GI upset   Simvastatin Other (See Comments)    Myalgia & GI upset   Eluxadoline Nausea And Vomiting and Other (See Comments)    Viberzi - stomach pain   Iohexol  Hives, Rash and Other (See Comments)   Marinol [Dronabinol] Palpitations and Other (See Comments)    "Made me feel high and almost passed out"   Red Dye #40 (Allura Red) Hives and Rash    Red contrast dye     Social History:   Social History   Socioeconomic History  Marital status: Married    Spouse name: Not on file   Number of children: 3   Years of education: Not on file   Highest education level: 12th grade  Occupational History    Employer: DISABLED  Tobacco Use   Smoking status: Former    Current packs/day: 0.00    Types: Cigarettes    Quit date: 01/04/2020    Years since quitting: 3.3   Smokeless tobacco: Never  Vaping Use   Vaping status: Never Used  Substance and Sexual Activity   Alcohol use: No    Alcohol/week: 0.0 standard drinks of alcohol   Drug use: Never   Sexual activity: Not on file  Other Topics Concern   Not on file  Social History Narrative   Married, mother of 3 children (2 living sons age 92-26 and 10-31; her daughter was the victim of a murder that took place sometime around the time of the patient's MI. She was under significant amount of stress as her daughter had gone missing. She was never found. Finally the suspect was charged and convicted in 2009. She had sessile he stop smoking until that time frame when it brought back memories and she is now back to smoking a half pack a day.She does not get routine exercise, but is active around the house and doing housecleaning chores.She does not drink alcohol.         Family history: mgm died at 106, several maternal aunts and uncles all died in  their 87's as well; mother didn't have any heart issues, had dementia, DM, died in her 66's; father lung cancer - smoker died at 102; 1 brother w/o heart issues; 2 living children - one with seizure disorder, no cholesterol issues known       Diet: doesn't eat much; has problems with GI/bloating since bypass; maybe 1-1.5 meals per day; oatmeal in the monings; sandwiches/soups in small amounts in the day; IBS issues more constipation; linzess, simethicone , maalox       Exercise:  housework   Social Drivers of Corporate investment banker Strain: Low Risk  (11/24/2022)   Overall Financial Resource Strain (CARDIA)    Difficulty of Paying Living Expenses: Not very hard  Food Insecurity: No Food Insecurity (05/26/2023)   Hunger Vital Sign    Worried About Running Out of Food in the Last Year: Never true    Ran Out of Food in the Last Year: Never true  Transportation Needs: No Transportation Needs (05/26/2023)   PRAPARE - Administrator, Civil Service (Medical): No    Lack of Transportation (Non-Medical): No  Physical Activity: Insufficiently Active (11/24/2022)   Exercise Vital Sign    Days of Exercise per Week: 3 days    Minutes of Exercise per Session: 20 min  Stress: No Stress Concern Present (11/24/2022)   [employer] of Occupational Health - Occupational Stress Questionnaire    Feeling of Stress : Not at all  Social Connections: Unknown (05/25/2023)   Social Connection and Isolation Panel [NHANES]    Frequency of Communication with Friends and Family: Twice a week    Frequency of Social Gatherings with Friends and Family: Patient declined    Attends Religious Services: Patient declined    Database administrator or Organizations: No    Attends Banker Meetings: Never    Marital Status: Married  Catering manager Violence: Not At Risk (05/26/2023)   Humiliation, Afraid, Rape, and Kick questionnaire    Fear  of Current or Ex-Partner: No    Emotionally Abused:  No    Physically Abused: No    Sexually Abused: No    Family History:    Family History  Problem Relation Age of Onset   Heart failure Other    Diabetes Other    Diabetes Mother    Breast cancer Mother    Thyroid  disease Paternal Aunt    Thyroid  disease Maternal Grandmother      ROS:  Please see the history of present illness.   All other ROS reviewed and negative.     Physical Exam/Data:   Vitals:   05/28/23 0045 05/28/23 0507 05/28/23 0842 05/28/23 1022  BP: (!) 125/94 113/77 (!) 142/107 (!) 144/83  Pulse: (!) 103 (!) 110 (!) 59 (!) 115  Resp: 17 17 18  (!) 22  Temp: 97.8 F (36.6 C) 97.9 F (36.6 C) 97.7 F (36.5 C)   TempSrc: Oral Oral Oral   SpO2: 93% 93% 93% 98%  Weight:  76.6 kg    Height:        Intake/Output Summary (Last 24 hours) at 05/28/2023 1048 Last data filed at 05/28/2023 0454 Gross per 24 hour  Intake 2392.7 ml  Output 2675 ml  Net -282.3 ml      05/28/2023    5:07 AM 05/27/2023    6:02 AM 05/26/2023    5:56 AM  Last 3 Weights  Weight (lbs) 168 lb 14 oz 175 lb 4.3 oz 172 lb 9.9 oz  Weight (kg) 76.6 kg 79.5 kg 78.3 kg     Body mass index is 30.89 kg/m.  General:  in no acute distress HEENT: normal Neck: no JVD Cardiac: Irregular, tachycardic, no murmur Lungs:  clear to auscultation bilaterally, no wheezing, rhonchi or rales  Abd: Distended Ext: no edema Musculoskeletal:  No deformities Skin: warm and dry  Neuro:  no focal abnormalities noted Psych:  Normal affect   EKG:  The EKG was personally reviewed and demonstrates: No ECG Telemetry:  Telemetry was personally reviewed and demonstrates: A-fib with RVR  Relevant CV Studies:   Laboratory Data:  High Sensitivity Troponin:  No results for input(s): "TROPONINIHS" in the last 720 hours.   Chemistry Recent Labs  Lab 05/26/23 0436 05/27/23 0553 05/28/23 0502  NA 135 130* 131*  K 4.4 3.4* 5.1  CL 105 98 98  CO2 20* 22 25  GLUCOSE 157* 131* 130*  BUN 10 14 16   CREATININE  1.09* 1.15* 1.32*  CALCIUM  8.4* 8.6* 8.5*  MG  --  2.1 2.3  GFRNONAA 56* 52* 44*  ANIONGAP 10 10 8     No results for input(s): "PROT", "ALBUMIN", "AST", "ALT", "ALKPHOS", "BILITOT" in the last 168 hours. Lipids No results for input(s): "CHOL", "TRIG", "HDL", "LABVLDL", "LDLCALC", "CHOLHDL" in the last 168 hours.  Hematology Recent Labs  Lab 05/26/23 0436 05/27/23 0553 05/28/23 0502  WBC 9.6 11.9* 11.8*  RBC 3.59* 3.93 3.94  HGB 9.6* 10.7* 10.6*  HCT 31.7* 34.1* 34.3*  MCV 88.3 86.8 87.1  MCH 26.7 27.2 26.9  MCHC 30.3 31.4 30.9  RDW 17.1* 17.1* 17.2*  PLT 321 346 374   Thyroid  No results for input(s): "TSH", "FREET4" in the last 168 hours.  BNPNo results for input(s): "BNP", "PROBNP" in the last 168 hours.  DDimer No results for input(s): "DDIMER" in the last 168 hours.   Radiology/Studies:  DG Abd 1 View Result Date: 05/27/2023 CLINICAL DATA:  Nasogastric tube placement EXAM: ABDOMEN - 1 VIEW COMPARISON:  05/27/2023 at 9:48 a.m. FINDINGS: The nasogastric tube tip and side port are in the stomach body. Prior CABG. Blunting of the left lateral costophrenic angle compatible with a left pleural effusion. Upper normal caliber loops of small bowel in the upper abdomen. IMPRESSION: 1. The nasogastric tube tip and side port are in the stomach body. 2. Left pleural effusion. Electronically Signed   By: Freida Jes M.D.   On: 05/27/2023 16:37   DG Abd Portable 1V Result Date: 05/27/2023 CLINICAL DATA:  Laparoscopic right colectomy 05/25/2023. Colon cancer. Ileus. EXAM: PORTABLE ABDOMEN - 1 VIEW COMPARISON:  CT scan 03/29/2023 FINDINGS: Mildly dilated loops of small bowel up to 3.4 cm in diameter. Difficult to exclude a small amount of residual free intraperitoneal gas given the bowel contour in the left abdomen; this is not necessarily unexpected on postoperative day 2. There is some gas in the colon. No significant abnormal calcifications. Mild lower lumbar spondylosis. IMPRESSION: 1.  Mildly dilated loops of small bowel up to 3.4 cm in diameter, with some gas in the colon. This is nonspecific and could represent ileus or partial small bowel obstruction. 2. Difficult to exclude a small amount of residual free intraperitoneal gas given the bowel contour in the left abdomen; this is not necessarily unexpected on postoperative day 2. 3. Mild lower lumbar spondylosis. Electronically Signed   By: Freida Jes M.D.   On: 05/27/2023 12:12     Assessment and Plan:   Atrial fibrillation with RVR: She was admitted for right hemicolectomy for cecal adenocarcinoma.  Has history of paroxysmal atrial fibrillation.  Dr. Addie Holstein recommended prescribing prophylactic amiodarone  to maintain sinus rhythm perioperatively, as suspected she would be high risk for Afib after surgery.  She has only been on telemetry for 2 days but appears from documentation that she has been in A-fib since her surgery.  She is now strict n.p.o. with ileus and in A-fib with RVR - Rates are uncontrolled.  She has an ileus and currently unable to take p.o. Would hold off on amiodarone  drip to avoid risk of chemical cardioversion as she has been in A-fib for days and not on anticoagulation.  Suggest diltiazem  gtt for rate control while she is NPO, her EF is normal on recent echo.   -Would start on heparin  gtt if OK with surgery  CAD: History of anterior STEMI. Complicated by V-fib arrest and she underwent ultimate CABG with SVG to LAD after failed LIMA to LAD.  On Repatha  as outpatient.  Echocardiogram 03/2023 showed EF 55 to 60%, normal RV function  Hypertension: BP has been elevated, starting diltiazem  as above  For questions or updates, please contact [facility] Please consult [email or web address] for contact info under    Signed, Xika Zhao, NP  05/28/2023 10:48 AM

## 2023-05-28 NOTE — Progress Notes (Addendum)
 Subjective Patient reports that she is feeling better today.  Has not had any further bowel movements or flatus since the diarrhea noted yesterday/overnight prior.  Reports pain is significantly improved and denies any abdominal pain currently.  Objective: Vital signs in last 24 hours: Temp:  [97.5 F (36.4 C)-98.4 F (36.9 C)] 97.7 F (36.5 C) (05/25 0842) Pulse Rate:  [59-128] 59 (05/25 0842) Resp:  [17-20] 18 (05/25 0842) BP: (113-149)/(77-107) 142/107 (05/25 0842) SpO2:  [93 %-98 %] 93 % (05/25 0842) Weight:  [76.6 kg] 76.6 kg (05/25 0507) Last BM Date : 05/27/23  Intake/Output from previous day: 05/24 0701 - 05/25 0700 In: 2881.7 [P.O.:210; I.V.:2022.8; NG/GT:260; IV Piggyback:388.9] Out: 2625 [Urine:225; Emesis/NG output:2400] Intake/Output this shift: Total I/O In: 30 [NG/GT:30] Out: 50 [Emesis/NG output:50]  Gen: NAD CV: RRR, intermittent tachycardia noted but currently heart rate in the 60s. Pulm: Normal work of breathing, some expiratory wheezing noted Abd: Soft but distended with edematous/firm abdominal wall; not tender today.  Incisions are all clean, dry and intact without signs of infection Ext: SCDs in place  Lab Results: CBC  Recent Labs    05/27/23 0553 05/28/23 0502  WBC 11.9* 11.8*  HGB 10.7* 10.6*  HCT 34.1* 34.3*  PLT 346 374   BMET Recent Labs    05/27/23 0553 05/28/23 0502  NA 130* 131*  K 3.4* 5.1  CL 98 98  CO2 22 25  GLUCOSE 131* 130*  BUN 14 16  CREATININE 1.15* 1.32*  CALCIUM  8.6* 8.5*   PT/INR No results for input(s): "LABPROT", "INR" in the last 72 hours. ABG No results for input(s): "PHART", "HCO3" in the last 72 hours.  Invalid input(s): "PCO2", "PO2"  Studies/Results:  Anti-infectives: Anti-infectives (From admission, onward)    Start     Dose/Rate Route Frequency Ordered Stop   05/25/23 1400  neomycin (MYCIFRADIN) tablet 1,000 mg  Status:  Discontinued       Placed in "And" Linked Group   1,000 mg Oral 3 times  per day 05/25/23 0835 05/25/23 0840   05/25/23 1400  metroNIDAZOLE (FLAGYL) tablet 1,000 mg  Status:  Discontinued       Placed in "And" Linked Group   1,000 mg Oral 3 times per day 05/25/23 0835 05/25/23 0840   05/25/23 0845  cefoTEtan (CEFOTAN) 2 g in sodium chloride  0.9 % 100 mL IVPB        2 g 200 mL/hr over 30 Minutes Intravenous On call to O.R. 05/25/23 0835 05/26/23 0809        Assessment/Plan: Patient Active Problem List   Diagnosis Date Noted   Prediabetes 05/26/2023   Hyperglycemia 05/26/2023   S/P partial colectomy 05/25/2023   Myalgia due to statin 04/12/2023   Adenocarcinoma of cecum (HCC) 04/12/2023   Preop cardiovascular exam 04/12/2023   Hypokalemia 04/12/2023   IBS (irritable bowel syndrome) 04/12/2023   Hypercoagulable state due to paroxysmal atrial fibrillation (HCC) 12/12/2021   Uterine fibroid 12/23/2020   Abdominal bloating 04/15/2020   Coronary artery disease involving autologous vein coronary bypass graft with unstable angina pectoris (HCC) 01/08/2020   Ischemic cardiomyopathy 01/03/2020   Essential hypertension 06/14/2019   Persistent atrial fibrillation with rapid ventricular response (HCC) 05/14/2014   Paroxysmal atrial fibrillation (HCC); CHA2DS2-VASc Score 3. On Xarelto  05/04/2014   COPD (chronic obstructive pulmonary disease) (HCC) 05/04/2014   Hyperlipidemia with target low density lipoprotein (LDL) cholesterol less than 55 mg/dL    Tobacco abuse    Hypothyroid 12/03/2012   Multinodular goiter 12/03/2012  Native CAD- 100% LAD, s/p SVG-LAD after failed LIMA-LAD 11/14/1996    Class: Chronic   S/P CABG x 2 07/03/1996   History of ST elevation (STEMI) myocardial infarction involving left anterior descending coronary artery (HCC) 01/19/1996   s/p Procedure(s): COLECTOMY, RIGHT, LAPAROSCOPIC LYSIS, ADHESIONS, LAPAROSCOPIC 05/25/2023  -Ileus-2400 out of NG tube over last 24 hours.  Output today is light and thin, minimal output this morning.  She  did have diarrhea overnight 5/23-24 but no bowel movements in the last 24 hours.  Continue NG tube to suction and n.p.o. for today. Afebrile, WBC stable at 11.8, hemoglobin stable and abdominal exam improved compared to yesterday morning.  Repeat films today + EKG. -AKI; Slight increase in creatinine to 1.32 from 1.15 yesterday, 1.09 preop.  Hyponatremia 131. Urine output low but unclear how accurately this was recorded.  Will place Foley for more accurate monitoring.  Patient reports she is having trouble voiding. -Low threshold for CT but would hold off as she is only postop day 3 and exam seems improved today.  - Holding Xarelto  until hgb stable x 24 hrs and clearly tolerating soft diet.  Continue subcu heparin  for now. - Ambulate 5x/day; PT & OT to see - [facility] following for assistance in medical mgmt - appreciate their help with her; may need to convert some of her meds to IV if possible especially cardiac meds   -PPX: SQH, SCDs   LOS: 3 days   Adalberto Acton  MD [facility]

## 2023-05-28 NOTE — Progress Notes (Signed)
 PHARMACY - ANTICOAGULATION CONSULT NOTE  Pharmacy Consult for Heparin  Indication: atrial fibrillation  Allergies  Allergen Reactions   Iodinated Contrast Media Hives and Dermatitis   Linzess [Linaclotide] Other (See Comments)    Caused horrible stomach pain and gas   Atorvastatin  Other (See Comments)    Gas pain in abdomen with no relief   Other Diarrhea and Other (See Comments)    IBS- Special diet   Milk-Related Compounds Diarrhea and Other (See Comments)    Has IBS   Rosuvastatin  Calcium  Other (See Comments)    Worsening GI upset   Simvastatin Other (See Comments)    Myalgia & GI upset   Eluxadoline Nausea And Vomiting and Other (See Comments)    Viberzi - stomach pain   Iohexol  Hives, Rash and Other (See Comments)   Marinol [Dronabinol] Palpitations and Other (See Comments)    "Made me feel high and almost passed out"   Red Dye #40 (Allura Red) Hives and Rash    Red contrast dye     Patient Measurements: Height: 5\' 2"  (157.5 cm) Weight: 76.6 kg (168 lb 14 oz) IBW/kg (Calculated) : 50.1 HEPARIN  DW (KG): 67.3  Vital Signs: Temp: 98 F (36.7 C) (05/25 1636) Temp Source: Oral (05/25 1636) BP: 110/79 (05/25 1636) Pulse Rate: 109 (05/25 1400)  Labs: Recent Labs    05/26/23 0436 05/27/23 0553 05/28/23 0502 05/28/23 1816  HGB 9.6* 10.7* 10.6*  --   HCT 31.7* 34.1* 34.3*  --   PLT 321 346 374  --   HEPARINUNFRC  --   --   --  0.56  CREATININE 1.09* 1.15* 1.32*  --     Estimated Creatinine Clearance: 39.6 mL/min (A) (by C-G formula based on SCr of 1.32 mg/dL (H)).   Medical History: Past Medical History:  Diagnosis Date   ACS (acute coronary syndrome) (HCC) 01/06/2020   With progressive angina while troponin elevation--borderline-NON-STEMI   ANTERIOR STEMI of LAD (x2). 01/19/1996   Complicated by VF arrest, and anoxic brain injury with status epilepticus; POBA of LAD --> 6 months later, after 2 vessel CABG she had postop complication with occluded  LIMA/second anterior STEMI   CAD in native artery & Grafts 01/1996   a) 1/'98: Ant STEMI => LAD POBA; b) 2/'98: 95% prox LAD @ POBA site --> BMS PCI (3.0 mm x 25 mm); c) 7/98 (5 months later) => BMS ISR of LAD--> CABGx2 (LIMA-LAD, SVG-D1 -> Emergent redo w/ SVG-LAD b/c acute LIMA occlusion); d) 01/2020: ACS -> DES PCI of SVG-LAD (75%&85%), TO of SVG-D1.   COPD (chronic obstructive pulmonary disease) (HCC)    Dyspnea    GERD (gastroesophageal reflux disease)    Headache    Hiatal hernia    Hyperlipidemia    Hypertension    Hypothyroidism    On Synthroid    Iron deficiency anemia    Irritable bowel syndrome    Followed by Dr. Tami Falcon.   Ischemic cardiomyopathy 01/03/2020   ECHO (ACS): EF 45-50% (by Cath EF 40-45%) - Apical Inferior/Apical Anteroseptal & Apex akinetic, GR II DD.    PAF (paroxysmal atrial fibrillation) (HCC) 05/03/2014   New onset - originally with RVR   Pneumonia    x 2   PONV (postoperative nausea and vomiting)    very gag reflex   S/P CABG x 2; with EMERGENT Redo x 1. 07/1996   Initially LIMA-LAD, SVG-D1 --> immediate LIMA failure post CABG with anterior MI --> urgent redo SVG-LAD beyond D2.; Widely patent grafts  as of 2005 with left dominant system. Graft to the diagonal branch has a very small target vessel but the vein graft to LAD retrograde fills a large bifurcating D2 and antegrade fills a large D3.   Tobacco abuse     Assessment: 5/22: laparoscopic R colectomy with LOA  AC/Heme: Xarelto  PTA for afib. (LD unknown) SQ heparin  postop. Hgb 9.6>10.6 stable today - 5/25: IV heparin  ok per surgery Hep level 0.56 in goal.  Goal of Therapy:  Heparin  level 0.3-0.7 units/ml Monitor platelets by anticoagulation protocol: Yes   Plan:   Con't IV heparin  (no bolus, recent surgery) 1000 units/hr Daily HL and CBC   Icholas Irby Darcel Early, PharmD, BCPS Clinical Staff Pharmacist Enis Harsh Stillinger 05/28/2023,7:29 PM

## 2023-05-28 NOTE — Progress Notes (Signed)
 Initial Nutrition Assessment  DOCUMENTATION CODES:   Not applicable  INTERVENTION:   Monitor for diet advancement  ONS when appropriate  Refer to outpatient oncology RD   NUTRITION DIAGNOSIS:   Increased nutrient needs related to post-op healing as evidenced by estimated needs.  GOAL:   Patient will meet greater than or equal to 90% of their needs  MONITOR:   Diet advancement  REASON FOR ASSESSMENT:   Consult Diet education  ASSESSMENT:   Pt with PMH of COPD, Afib, and DM admitted for elective R hemicolectomy for cecal adenocarcinoma   Consult received for diet education, not appropriate at this time. Pt now with post-op ileus vs SBO, +diarrhea, NPO with NG tube for suction.   Per chart review; pt's weight has been stable at 72 - 73 kg x 1 year  Per allergies pt has IBS and is lactose intolerant  5/22 - s/p R hemicolectomy, LOA  Medications reviewed and include: oscal with D 500 mg daily, ensure surgery BID, SSI, synthroid  NS @ 40 ml/hr  Labs reviewed:  Na 131 A1C 6.3  18 F NG tube: 1650 ml out x 24 hours   NUTRITION - FOCUSED PHYSICAL EXAM:  Deferred to follow up   Diet Order:   Diet Order             Diet NPO time specified Except for: Ice Chips, Sips with Meds  Diet effective now                   EDUCATION NEEDS:   Not appropriate for education at this time  Skin:  Skin Assessment: Reviewed RN Assessment (abd incision)  Last BM:  5/24, abd firm per RN  Height:   Ht Readings from Last 1 Encounters:  05/26/23 5\' 2"  (1.575 m)    Weight:   Wt Readings from Last 1 Encounters:  05/28/23 76.6 kg    BMI:  Body mass index is 30.89 kg/m.  Estimated Nutritional Needs:   Kcal:  1800-2000  Protein:  90-100 grams  Fluid:  >1.8 L/day  Randine Butcher., RD, LDN, CNSC See AMiON for contact information

## 2023-05-28 NOTE — Progress Notes (Signed)
 Pt voided of tea colored urine. Bladder scanned and noted . Informed Laurence Pons, NP of this. Orders received to I&O cath. Attempted x2. Unsuccessful. Charge nurse- Ethelle Herb attempted and was successful. However, didn't retrieve but about 25ml of tea colored urine in bag. No further flow of urine noted. Informed Laurence Pons, NP of this. No orders received.

## 2023-05-28 NOTE — Progress Notes (Addendum)
 Dr Jerilynn Montenegro and Dr Lanell Pinta made aware of pt's continued elevated HR this am and decreased urinary output. Orders received, 12 lead EKG = rate 125 w RVR. Stat orders noted, inserted # 16 Fr foley to S/D w return of only 100 cc's tea colored urine. Pt transferred to PCU via bed w all belongings.

## 2023-05-28 NOTE — Progress Notes (Signed)
  Progress Note   Patient: Dominique Barber ZDG:[id number] DOB: 02/17/1955 DOA: 05/25/2023     3 DOS: the patient was seen and examined on 05/28/2023   Brief hospital course: 68 year old woman PMH including COPD without supplemental oxygen need, paroxysmal atrial fibrillation, who underwent elective right hemicolectomy for treatment of cecal adenocarcinoma.  Hospitalist were consulted postoperatively for medical management.  Attending CCS  Procedures/Events Right hemicolectomy   Assessment and Plan: Status post right hemicolectomy Cecal adenocarcinoma Post-operative ileus Abdominal pain, diarrhea Management per primary team  Acute renal insufficiency AKI considered but ruled out at this time Baseline creatinine around 1.1.  1.32 today. IV fluids and check BMP in AM.   Paroxysmal atrial fibrillation Atrial fibrillation with rapid ventricular response Atrial fibrillation recurred, stop amiodarone  given ileus Rapid rate not responsive to beta-blocker.  Start diltiazem  infusion, appreciate cardiology consultation. IV heparin  for now per cardiology.  Discussed with Dr. Jamse Mcgee who approved. Resume Xarelto  when improved   Hyperglycemia Prediabetes Hemoglobin A1c 6.09 November 2022.  I do not see that this has been addressed as an outpatient. Repeat hemoglobin A1c 6.3 here meeting definition of prediabetes. Recommend lifestyle modification and close follow-up with PCP. Recommend metformin if no diarrhea on discharge and renal function permits.   Hypothyroidism Continue Synthroid   Hypokalemia Monitor replenish as appropriate   Leukocytosis: Likely demargination Continue monitoring    Subjective:  Atrial fibrillation with rapid ventricular response noted Patient feels okay but has had nausea and had an NG tube placed yesterday  Physical Exam: Vitals:   05/27/23 2028 05/28/23 0045 05/28/23 0507 05/28/23 0842  BP: (!) 149/98 (!) 125/94 113/77 (!) 142/107  Pulse: 89 (!)  103 (!) 110 (!) 59  Resp: 17 17 17 18   Temp: 97.7 F (36.5 C) 97.8 F (36.6 C) 97.9 F (36.6 C) 97.7 F (36.5 C)  TempSrc:  Oral Oral Oral  SpO2: 97% 93% 93% 93%  Weight:   76.6 kg   Height:       Physical Exam Vitals reviewed.  Constitutional:      General: She is not in acute distress.    Appearance: She is not ill-appearing or toxic-appearing.  Cardiovascular:     Rate and Rhythm: Tachycardia present. Rhythm irregular.     Heart sounds: No murmur heard.    Comments: Atrial fibrillation Pulmonary:     Effort: Pulmonary effort is normal. No respiratory distress.     Breath sounds: No wheezing, rhonchi or rales.  Neurological:     Mental Status: She is alert.  Psychiatric:        Mood and Affect: Mood normal.        Behavior: Behavior normal.     Data Reviewed: CBG stable Sodium 131, stable Creatinine mildly elevated 1.32 Phosphorus 2.7, magnesium 2.3 Hemoglobin stable 10.6 WBC stable 11.8 Heme globin A1c 6.3  Family Communication: none  Disposition: Status is: Inpatient Remains inpatient appropriate because: afib rvr     Time spent: 35 minutes  Author: Jerline Moon, MD 05/28/2023 9:46 AM  For on call review [email or web address].

## 2023-05-29 ENCOUNTER — Inpatient Hospital Stay (HOSPITAL_COMMUNITY)

## 2023-05-29 DIAGNOSIS — R739 Hyperglycemia, unspecified: Secondary | ICD-10-CM | POA: Diagnosis not present

## 2023-05-29 DIAGNOSIS — R7303 Prediabetes: Secondary | ICD-10-CM | POA: Diagnosis not present

## 2023-05-29 DIAGNOSIS — Z9049 Acquired absence of other specified parts of digestive tract: Secondary | ICD-10-CM | POA: Diagnosis not present

## 2023-05-29 DIAGNOSIS — I4891 Unspecified atrial fibrillation: Secondary | ICD-10-CM | POA: Diagnosis not present

## 2023-05-29 LAB — HEPARIN LEVEL (UNFRACTIONATED)
Heparin Unfractionated: >1.1 [IU]/mL — ABNORMAL HIGH (ref 0.30–0.70)
Heparin Unfractionated: 0.62 [IU]/mL (ref 0.30–0.70)
Heparin Unfractionated: 0.74 [IU]/mL — ABNORMAL HIGH (ref 0.30–0.70)

## 2023-05-29 LAB — BASIC METABOLIC PANEL WITH GFR
Anion gap: 10 (ref 5–15)
BUN: 15 mg/dL (ref 8–23)
CO2: 22 mmol/L (ref 22–32)
Calcium: 8.2 mg/dL — ABNORMAL LOW (ref 8.9–10.3)
Chloride: 99 mmol/L (ref 98–111)
Creatinine, Ser: 1.05 mg/dL — ABNORMAL HIGH (ref 0.44–1.00)
GFR, Estimated: 58 mL/min — ABNORMAL LOW (ref >60)
Glucose, Bld: 87 mg/dL (ref 70–99)
Potassium: 4.6 mmol/L (ref 3.5–5.1)
Sodium: 131 mmol/L — ABNORMAL LOW (ref 135–145)

## 2023-05-29 LAB — CBC WITH DIFFERENTIAL/PLATELET
Abs Immature Granulocytes: 0.08 10*3/uL — ABNORMAL HIGH (ref 0.00–0.07)
Basophils Absolute: 0.1 10*3/uL (ref 0.0–0.1)
Basophils Relative: 1 %
Eosinophils Absolute: 0.2 10*3/uL (ref 0.0–0.5)
Eosinophils Relative: 2 %
HCT: 32.7 % — ABNORMAL LOW (ref 36.0–46.0)
Hemoglobin: 9.8 g/dL — ABNORMAL LOW (ref 12.0–15.0)
Immature Granulocytes: 1 %
Lymphocytes Relative: 14 %
Lymphs Abs: 1.3 10*3/uL (ref 0.7–4.0)
MCH: 26.8 pg (ref 26.0–34.0)
MCHC: 30 g/dL (ref 30.0–36.0)
MCV: 89.6 fL (ref 80.0–100.0)
Monocytes Absolute: 0.7 10*3/uL (ref 0.1–1.0)
Monocytes Relative: 8 %
Neutro Abs: 7.1 10*3/uL (ref 1.7–7.7)
Neutrophils Relative %: 74 %
Platelets: 349 10*3/uL (ref 150–400)
RBC: 3.65 MIL/uL — ABNORMAL LOW (ref 3.87–5.11)
RDW: 17.8 % — ABNORMAL HIGH (ref 11.5–15.5)
WBC: 9.5 10*3/uL (ref 4.0–10.5)
nRBC: 0.4 % — ABNORMAL HIGH (ref 0.0–0.2)

## 2023-05-29 LAB — GLUCOSE, CAPILLARY
Glucose-Capillary: 84 mg/dL (ref 70–99)
Glucose-Capillary: 90 mg/dL (ref 70–99)
Glucose-Capillary: 90 mg/dL (ref 70–99)
Glucose-Capillary: 82 mg/dL (ref 70–99)

## 2023-05-29 LAB — MAGNESIUM: Magnesium: 2.5 mg/dL — ABNORMAL HIGH (ref 1.7–2.4)

## 2023-05-29 MED ORDER — ORAL CARE MOUTH RINSE
15.0000 mL | OROMUCOSAL | Status: DC
  Administered 2023-05-29 – 2023-06-07 (×34): 15 mL via OROMUCOSAL

## 2023-05-29 MED ORDER — ACETAMINOPHEN 10 MG/ML IV SOLN
1000.0000 mg | Freq: Four times a day (QID) | INTRAVENOUS | Status: AC
  Administered 2023-05-29 – 2023-05-30 (×3): 1000 mg via INTRAVENOUS
  Filled 2023-05-29 (×3): qty 100

## 2023-05-29 MED ORDER — ORAL CARE MOUTH RINSE: 15.0000 mL | OROMUCOSAL | Status: DC | PRN

## 2023-05-29 MED ORDER — ACETAMINOPHEN 10 MG/ML IV SOLN: 1000.0000 mg | Freq: Four times a day (QID) | INTRAVENOUS | Status: DC

## 2023-05-29 MED ORDER — HEPARIN (PORCINE) 25000 UT/250ML-% IV SOLN
850.0000 [IU]/h | INTRAVENOUS | Status: DC
  Administered 2023-05-29 (×2): 800 [IU]/h via INTRAVENOUS
  Administered 2023-05-31: 700 [IU]/h via INTRAVENOUS
  Administered 2023-06-01: 950 [IU]/h via INTRAVENOUS
  Administered 2023-06-02 – 2023-06-05 (×3): 850 [IU]/h via INTRAVENOUS
  Filled 2023-05-29 (×6): qty 250

## 2023-05-29 NOTE — Progress Notes (Signed)
 Spoke with Fu,RN for patient regarding PICC.  Patient is not interested in having PICC placed today.  Made aware that PICC will not be able to be placed later today if she changes her mind.  PICC team will follow up 5/27.

## 2023-05-29 NOTE — Progress Notes (Signed)
 4 Days Post-Op   Subjective/Chief Complaint: Pt with no BM or flautus Feels better with NGT    Objective: Vital signs in last 24 hours: Temp:  [97.7 F (36.5 C)-98.1 F (36.7 C)] 97.8 F (36.6 C) (05/26 0500) Pulse Rate:  [59-148] 67 (05/26 0600) Resp:  [12-22] 14 (05/26 0600) BP: (93-148)/(56-107) 110/76 (05/26 0600) SpO2:  [90 %-100 %] 90 % (05/26 0600) Weight:  [76.2 kg] 76.2 kg (05/26 0500) Last BM Date : 05/27/23  Intake/Output from previous day: 05/25 0701 - 05/26 0700 In: 435.7 [I.V.:305.7; NG/GT:30; IV Piggyback:100] Out: 950 [Urine:900; Emesis/NG output:50] Intake/Output this shift: No intake/output data recorded.  General appearance: alert and cooperative Resp: clear to auscultation bilaterally Cardio: NSR  Incision/Wound:CDI distended but some BS   Lab Results:  Recent Labs    05/27/23 0553 05/28/23 0502  WBC 11.9* 11.8*  HGB 10.7* 10.6*  HCT 34.1* 34.3*  PLT 346 374   BMET Recent Labs    05/28/23 0502 05/29/23 0458  NA 131* 131*  K 5.1 4.6  CL 98 99  CO2 25 22  GLUCOSE 130* 87  BUN 16 15  CREATININE 1.32* 1.05*  CALCIUM  8.5* 8.2*   PT/INR No results for input(s): "LABPROT", "INR" in the last 72 hours. ABG No results for input(s): "PHART", "HCO3" in the last 72 hours.  Invalid input(s): "PCO2", "PO2"  Studies/Results: US  EKG SITE RITE Result Date: 05/28/2023 If Site Rite image not attached, placement could not be confirmed due to current cardiac rhythm.  DG Abd 1 View Result Date: 05/27/2023 CLINICAL DATA:  Nasogastric tube placement EXAM: ABDOMEN - 1 VIEW COMPARISON:  05/27/2023 at 9:48 a.m. FINDINGS: The nasogastric tube tip and side port are in the stomach body. Prior CABG. Blunting of the left lateral costophrenic angle compatible with a left pleural effusion. Upper normal caliber loops of small bowel in the upper abdomen. IMPRESSION: 1. The nasogastric tube tip and side port are in the stomach body. 2. Left pleural effusion.  Electronically Signed   By: Freida Jes M.D.   On: 05/27/2023 16:37   DG Abd Portable 1V Result Date: 05/27/2023 CLINICAL DATA:  Laparoscopic right colectomy 05/25/2023. Colon cancer. Ileus. EXAM: PORTABLE ABDOMEN - 1 VIEW COMPARISON:  CT scan 03/29/2023 FINDINGS: Mildly dilated loops of small bowel up to 3.4 cm in diameter. Difficult to exclude a small amount of residual free intraperitoneal gas given the bowel contour in the left abdomen; this is not necessarily unexpected on postoperative day 2. There is some gas in the colon. No significant abnormal calcifications. Mild lower lumbar spondylosis. IMPRESSION: 1. Mildly dilated loops of small bowel up to 3.4 cm in diameter, with some gas in the colon. This is nonspecific and could represent ileus or partial small bowel obstruction. 2. Difficult to exclude a small amount of residual free intraperitoneal gas given the bowel contour in the left abdomen; this is not necessarily unexpected on postoperative day 2. 3. Mild lower lumbar spondylosis. Electronically Signed   By: Freida Jes M.D.   On: 05/27/2023 12:12    Anti-infectives: Anti-infectives (From admission, onward)    Start     Dose/Rate Route Frequency Ordered Stop   05/25/23 1400  neomycin (MYCIFRADIN) tablet 1,000 mg  Status:  Discontinued       Placed in "And" Linked Group   1,000 mg Oral 3 times per day 05/25/23 0835 05/25/23 0840   05/25/23 1400  metroNIDAZOLE (FLAGYL) tablet 1,000 mg  Status:  Discontinued  Placed in "And" Linked Group   1,000 mg Oral 3 times per day 05/25/23 0835 05/25/23 0840   05/25/23 0845  cefoTEtan (CEFOTAN) 2 g in sodium chloride  0.9 % 100 mL IVPB        2 g 200 mL/hr over 30 Minutes Intravenous On call to O.R. 05/25/23 1610 05/26/23 0809       Assessment/Plan: s/p Procedure(s) with comments: COLECTOMY, RIGHT, LAPAROSCOPIC (Right) - LAPAROSCOPIC RIGHT HEMICOLECTOMY LYSIS, ADHESIONS, LAPAROSCOPIC (N/A) ILEUS NOTED -cont NGT - check  KUB- May need CT at some point if this continues  A fib- rate better controlled      Appreciate medicine / cardiology assistance with this pt   LOS: 4 days    Dominique Clara  MD  05/29/2023

## 2023-05-29 NOTE — Progress Notes (Signed)
 PHARMACY - ANTICOAGULATION CONSULT NOTE  Pharmacy Consult for Heparin  Indication: atrial fibrillation  Allergies  Allergen Reactions   Iodinated Contrast Media Hives and Dermatitis   Linzess [Linaclotide] Other (See Comments)    Caused horrible stomach pain and gas   Atorvastatin  Other (See Comments)    Gas pain in abdomen with no relief   Other Diarrhea and Other (See Comments)    IBS- Special diet   Milk-Related Compounds Diarrhea and Other (See Comments)    Has IBS   Rosuvastatin  Calcium  Other (See Comments)    Worsening GI upset   Simvastatin Other (See Comments)    Myalgia & GI upset   Eluxadoline Nausea And Vomiting and Other (See Comments)    Viberzi - stomach pain   Iohexol  Hives, Rash and Other (See Comments)   Marinol [Dronabinol] Palpitations and Other (See Comments)    "Made me feel high and almost passed out"   Red Dye #40 (Allura Red) Hives and Rash    Red contrast dye     Patient Measurements: Height: 5\' 2"  (157.5 cm) Weight: 76.2 kg (167 lb 15.9 oz) IBW/kg (Calculated) : 50.1 HEPARIN  DW (KG): 67.3  Vital Signs: Temp: 97.7 F (36.5 C) (05/26 2211) Temp Source: Oral (05/26 2211) BP: 132/96 (05/26 2211) Pulse Rate: 125 (05/26 2211)  Labs: Recent Labs    05/27/23 0553 05/28/23 0502 05/28/23 1816 05/29/23 0458 05/29/23 1106 05/29/23 1237 05/29/23 2139  HGB 10.7* 10.6*  --   --  9.8*  --   --   HCT 34.1* 34.3*  --   --  32.7*  --   --   PLT 346 374  --   --  349  --   --   HEPARINUNFRC  --   --    < > >1.10*  --  0.62 0.74*  CREATININE 1.15* 1.32*  --  1.05*  --   --   --    < > = values in this interval not displayed.    Estimated Creatinine Clearance: 49.7 mL/min (A) (by C-G formula based on SCr of 1.05 mg/dL (H)).   Medical History: Past Medical History:  Diagnosis Date   ACS (acute coronary syndrome) (HCC) 01/06/2020   With progressive angina while troponin elevation--borderline-NON-STEMI   ANTERIOR STEMI of LAD (x2). 01/19/1996    Complicated by VF arrest, and anoxic brain injury with status epilepticus; POBA of LAD --> 6 months later, after 2 vessel CABG she had postop complication with occluded LIMA/second anterior STEMI   CAD in native artery & Grafts 01/1996   a) 1/'98: Ant STEMI => LAD POBA; b) 2/'98: 95% prox LAD @ POBA site --> BMS PCI (3.0 mm x 25 mm); c) 7/98 (5 months later) => BMS ISR of LAD--> CABGx2 (LIMA-LAD, SVG-D1 -> Emergent redo w/ SVG-LAD b/c acute LIMA occlusion); d) 01/2020: ACS -> DES PCI of SVG-LAD (75%&85%), TO of SVG-D1.   COPD (chronic obstructive pulmonary disease) (HCC)    Dyspnea    GERD (gastroesophageal reflux disease)    Headache    Hiatal hernia    Hyperlipidemia    Hypertension    Hypothyroidism    On Synthroid    Iron deficiency anemia    Irritable bowel syndrome    Followed by Dr. Tami Falcon.   Ischemic cardiomyopathy 01/03/2020   ECHO (ACS): EF 45-50% (by Cath EF 40-45%) - Apical Inferior/Apical Anteroseptal & Apex akinetic, GR II DD.    PAF (paroxysmal atrial fibrillation) (HCC) 05/03/2014   New onset -  originally with RVR   Pneumonia    x 2   PONV (postoperative nausea and vomiting)    very gag reflex   S/P CABG x 2; with EMERGENT Redo x 1. 07/1996   Initially LIMA-LAD, SVG-D1 --> immediate LIMA failure post CABG with anterior MI --> urgent redo SVG-LAD beyond D2.; Widely patent grafts as of 2005 with left dominant system. Graft to the diagonal branch has a very small target vessel but the vein graft to LAD retrograde fills a large bifurcating D2 and antegrade fills a large D3.   Tobacco abuse     Assessment: 5/22: laparoscopic R colectomy with LOA  AC/Heme: Xarelto  PTA for afib. (LD unknown) SQ heparin  postop. Hgb 9.6>10.6 stable today - 5/25: IV heparin  ok per surgery  05/29/2023 2139 heparin  level 0.74 supra-therapeutic on 800 units/hr Per RN no bleeding   Goal of Therapy:  Heparin  level 0.3-0.7 units/ml Monitor platelets by anticoagulation protocol: Yes   Plan:   Decrease heparin  drip to 700 units/hr Obtain Heparin  level 6 hours after rate change  Daily HL and CBC    Beau Bound RPh 05/29/2023, 10:34 PM

## 2023-05-29 NOTE — Progress Notes (Signed)
 PHARMACY - ANTICOAGULATION CONSULT NOTE  Pharmacy Consult for Heparin  Indication: atrial fibrillation  Allergies  Allergen Reactions   Iodinated Contrast Media Hives and Dermatitis   Linzess [Linaclotide] Other (See Comments)    Caused horrible stomach pain and gas   Atorvastatin  Other (See Comments)    Gas pain in abdomen with no relief   Other Diarrhea and Other (See Comments)    IBS- Special diet   Milk-Related Compounds Diarrhea and Other (See Comments)    Has IBS   Rosuvastatin  Calcium  Other (See Comments)    Worsening GI upset   Simvastatin Other (See Comments)    Myalgia & GI upset   Eluxadoline Nausea And Vomiting and Other (See Comments)    Viberzi - stomach pain   Iohexol  Hives, Rash and Other (See Comments)   Marinol [Dronabinol] Palpitations and Other (See Comments)    "Made me feel high and almost passed out"   Red Dye #40 (Allura Red) Hives and Rash    Red contrast dye     Patient Measurements: Height: 5\' 2"  (157.5 cm) Weight: 76.2 kg (167 lb 15.9 oz) IBW/kg (Calculated) : 50.1 HEPARIN  DW (KG): 67.3  Vital Signs: Temp: 97.7 F (36.5 C) (05/26 1319) Temp Source: Oral (05/26 1319) BP: 126/63 (05/26 1319) Pulse Rate: 98 (05/26 1319)  Labs: Recent Labs    05/27/23 0553 05/28/23 0502 05/28/23 1816 05/29/23 0458 05/29/23 1106 05/29/23 1237  HGB 10.7* 10.6*  --   --  9.8*  --   HCT 34.1* 34.3*  --   --  32.7*  --   PLT 346 374  --   --  349  --   HEPARINUNFRC  --   --  0.56 >1.10*  --  <0.10*  CREATININE 1.15* 1.32*  --  1.05*  --   --     Estimated Creatinine Clearance: 49.7 mL/min (A) (by C-G formula based on SCr of 1.05 mg/dL (H)).   Medical History: Past Medical History:  Diagnosis Date   ACS (acute coronary syndrome) (HCC) 01/06/2020   With progressive angina while troponin elevation--borderline-NON-STEMI   ANTERIOR STEMI of LAD (x2). 01/19/1996   Complicated by VF arrest, and anoxic brain injury with status epilepticus; POBA of LAD --> 6  months later, after 2 vessel CABG she had postop complication with occluded LIMA/second anterior STEMI   CAD in native artery & Grafts 01/1996   a) 1/'98: Ant STEMI => LAD POBA; b) 2/'98: 95% prox LAD @ POBA site --> BMS PCI (3.0 mm x 25 mm); c) 7/98 (5 months later) => BMS ISR of LAD--> CABGx2 (LIMA-LAD, SVG-D1 -> Emergent redo w/ SVG-LAD b/c acute LIMA occlusion); d) 01/2020: ACS -> DES PCI of SVG-LAD (75%&85%), TO of SVG-D1.   COPD (chronic obstructive pulmonary disease) (HCC)    Dyspnea    GERD (gastroesophageal reflux disease)    Headache    Hiatal hernia    Hyperlipidemia    Hypertension    Hypothyroidism    On Synthroid    Iron deficiency anemia    Irritable bowel syndrome    Followed by Dr. Tami Falcon.   Ischemic cardiomyopathy 01/03/2020   ECHO (ACS): EF 45-50% (by Cath EF 40-45%) - Apical Inferior/Apical Anteroseptal & Apex akinetic, GR II DD.    PAF (paroxysmal atrial fibrillation) (HCC) 05/03/2014   New onset - originally with RVR   Pneumonia    x 2   PONV (postoperative nausea and vomiting)    very gag reflex   S/P CABG x  2; with EMERGENT Redo x 1. 07/1996   Initially LIMA-LAD, SVG-D1 --> immediate LIMA failure post CABG with anterior MI --> urgent redo SVG-LAD beyond D2.; Widely patent grafts as of 2005 with left dominant system. Graft to the diagonal branch has a very small target vessel but the vein graft to LAD retrograde fills a large bifurcating D2 and antegrade fills a large D3.   Tobacco abuse     Assessment: 5/22: laparoscopic R colectomy with LOA  AC/Heme: Xarelto  PTA for afib. (LD unknown) SQ heparin  postop. Hgb 9.6>10.6 stable today - 5/25: IV heparin  ok per surgery  05/29/2023 HL <0.10  sub-therapeutic on 800 units/hr No line or bleeding issues with heparin  drip per RN   Goal of Therapy:  Heparin  level 0.3-0.7 units/ml Monitor platelets by anticoagulation protocol: Yes   Plan:  Increase  heparin  drip to 950 units/hr Obtain Heparin  level 6 hours after  rate change  Daily HL and CBC    Van Gelinas, PharmD, BCPS 05/29/2023 1:33 PM

## 2023-05-29 NOTE — Progress Notes (Signed)
  Progress Note   Patient: Dominique Barber ZOX:[id number] DOB: Jul 31, 1955 DOA: 05/25/2023     4 DOS: the patient was seen and examined on 05/29/2023   Brief hospital course: 68 year old woman PMH including COPD without supplemental oxygen need, paroxysmal atrial fibrillation, who underwent elective right hemicolectomy for treatment of cecal adenocarcinoma.  Hospitalist were consulted postoperatively for medical management.  Attending CCS  Procedures/Events Right hemicolectomy  Assessment and Plan: Status post right hemicolectomy Cecal adenocarcinoma Post-operative ileus Abdominal pain, diarrhea Management per primary team   Acute renal insufficiency AKI considered but ruled out at this time Baseline creatinine around 1.1.  Appears to be at baseline. Follow-up BMP, continue IV fluids while n.p.o.   Paroxysmal atrial fibrillation Atrial fibrillation with rapid ventricular response Atrial fibrillation recurred, stopped amiodarone  given ileus Rapid rate not responsive to beta-blocker.  Continue diltiazem  infusion, appreciate cardiology consultation. IV heparin  per cardiology.   Resume Xarelto  when improved   Hyperglycemia Prediabetes Hemoglobin A1c 6.09 November 2022.  I do not see that this has been addressed as an outpatient. Repeat hemoglobin A1c 6.3 here meeting definition of prediabetes. Recommend lifestyle modification and close follow-up with PCP. Recommend metformin if no diarrhea on discharge and renal function permits.   Hypothyroidism Continue Synthroid    Hypokalemia Monitor replenish as appropriate   Leukocytosis: Likely demargination Continue monitoring      Subjective:  Feels fine except for throat pain from NGT  Physical Exam: Vitals:   05/29/23 0300 05/29/23 0400 05/29/23 0500 05/29/23 0600  BP: 108/67 119/65 127/74 110/76  Pulse: 69 60 60 67  Resp: 12 12 15 14   Temp:   97.8 F (36.6 C)   TempSrc:   Oral   SpO2: 93% 93% 96% 90%  Weight:    76.2 kg   Height:       Physical Exam Vitals reviewed.  Constitutional:      General: She is not in acute distress.    Appearance: She is not ill-appearing or toxic-appearing.  Cardiovascular:     Rate and Rhythm: Tachycardia present. Rhythm irregular.     Heart sounds: No murmur heard.    Comments: Telemetry atrial fibrillation heart rate in the 100s. Pulmonary:     Effort: Pulmonary effort is normal. No respiratory distress.     Breath sounds: No wheezing, rhonchi or rales.  Neurological:     Mental Status: She is alert.  Psychiatric:        Mood and Affect: Mood normal.        Behavior: Behavior normal.     Data Reviewed: CBG stable Sodium 131 Creatinine stable 1.05 Hemoglobin stable 9.8   Family Communication: none  Disposition: Status is: Inpatient Remains inpatient appropriate because: s/p surgery, ileus, afib rvr     Time spent: 35 minutes  Author: Jerline Moon, MD 05/29/2023 8:44 AM  For on call review [email or web address].

## 2023-05-29 NOTE — Progress Notes (Signed)
 PHARMACY - ANTICOAGULATION CONSULT NOTE  Pharmacy Consult for Heparin  Indication: atrial fibrillation  Allergies  Allergen Reactions   Iodinated Contrast Media Hives and Dermatitis   Linzess [Linaclotide] Other (See Comments)    Caused horrible stomach pain and gas   Atorvastatin  Other (See Comments)    Gas pain in abdomen with no relief   Other Diarrhea and Other (See Comments)    IBS- Special diet   Milk-Related Compounds Diarrhea and Other (See Comments)    Has IBS   Rosuvastatin  Calcium  Other (See Comments)    Worsening GI upset   Simvastatin Other (See Comments)    Myalgia & GI upset   Eluxadoline Nausea And Vomiting and Other (See Comments)    Viberzi - stomach pain   Iohexol  Hives, Rash and Other (See Comments)   Marinol [Dronabinol] Palpitations and Other (See Comments)    "Made me feel high and almost passed out"   Red Dye #40 (Allura Red) Hives and Rash    Red contrast dye     Patient Measurements: Height: 5\' 2"  (157.5 cm) Weight: 76.2 kg (167 lb 15.9 oz) IBW/kg (Calculated) : 50.1 HEPARIN  DW (KG): 67.3  Vital Signs: Temp: 97.8 F (36.6 C) (05/26 0500) Temp Source: Oral (05/26 0500) BP: 127/74 (05/26 0500) Pulse Rate: 60 (05/26 0500)  Labs: Recent Labs    05/27/23 0553 05/28/23 0502 05/28/23 1816 05/29/23 0458  HGB 10.7* 10.6*  --   --   HCT 34.1* 34.3*  --   --   PLT 346 374  --   --   HEPARINUNFRC  --   --  0.56 >1.10*  CREATININE 1.15* 1.32*  --  1.05*    Estimated Creatinine Clearance: 49.7 mL/min (A) (by C-G formula based on SCr of 1.05 mg/dL (H)).   Medical History: Past Medical History:  Diagnosis Date   ACS (acute coronary syndrome) (HCC) 01/06/2020   With progressive angina while troponin elevation--borderline-NON-STEMI   ANTERIOR STEMI of LAD (x2). 01/19/1996   Complicated by VF arrest, and anoxic brain injury with status epilepticus; POBA of LAD --> 6 months later, after 2 vessel CABG she had postop complication with occluded  LIMA/second anterior STEMI   CAD in native artery & Grafts 01/1996   a) 1/'98: Ant STEMI => LAD POBA; b) 2/'98: 95% prox LAD @ POBA site --> BMS PCI (3.0 mm x 25 mm); c) 7/98 (5 months later) => BMS ISR of LAD--> CABGx2 (LIMA-LAD, SVG-D1 -> Emergent redo w/ SVG-LAD b/c acute LIMA occlusion); d) 01/2020: ACS -> DES PCI of SVG-LAD (75%&85%), TO of SVG-D1.   COPD (chronic obstructive pulmonary disease) (HCC)    Dyspnea    GERD (gastroesophageal reflux disease)    Headache    Hiatal hernia    Hyperlipidemia    Hypertension    Hypothyroidism    On Synthroid    Iron deficiency anemia    Irritable bowel syndrome    Followed by Dr. Tami Falcon.   Ischemic cardiomyopathy 01/03/2020   ECHO (ACS): EF 45-50% (by Cath EF 40-45%) - Apical Inferior/Apical Anteroseptal & Apex akinetic, GR II DD.    PAF (paroxysmal atrial fibrillation) (HCC) 05/03/2014   New onset - originally with RVR   Pneumonia    x 2   PONV (postoperative nausea and vomiting)    very gag reflex   S/P CABG x 2; with EMERGENT Redo x 1. 07/1996   Initially LIMA-LAD, SVG-D1 --> immediate LIMA failure post CABG with anterior MI --> urgent redo SVG-LAD beyond  D2.; Widely patent grafts as of 2005 with left dominant system. Graft to the diagonal branch has a very small target vessel but the vein graft to LAD retrograde fills a large bifurcating D2 and antegrade fills a large D3.   Tobacco abuse     Assessment: 5/22: laparoscopic R colectomy with LOA  AC/Heme: Xarelto  PTA for afib. (LD unknown) SQ heparin  postop. Hgb 9.6>10.6 stable today - 5/25: IV heparin  ok per surgery  05/29/2023 HL > 1.10 supra-therapeutic on 1000 units/hr Per RN blood drawn on opposite arm of where heparin  is infusing  Goal of Therapy:  Heparin  level 0.3-0.7 units/ml Monitor platelets by anticoagulation protocol: Yes   Plan:  Hold heparin  x 1 hour Then resume heparin  drip at 800 units/hr Heparin  level in 6 hours Daily HL and CBC   Beau Bound  RPh 05/29/2023, 5:58 AM

## 2023-05-29 NOTE — Progress Notes (Signed)
 Pharmacy - IV heparin   Assessment:    Please see note from Starling Eck, PharmD earlier today for full details.  Briefly, 68 y.o. female on IV heparin  for Afib (while Xarelto  on hold)  12:37 heparin  level returned as undetectable; heparin  adjusted from 800 to 950 units/hr in response At 15:30, Pharmacist notified by lab that undetectable level was actually an error in Lab software, and true level was 0.62 (upper end of therapeutic range) on 800 units/hr  Plan:  Notified RN to adjust IV heparin  back to 800 units/hr Repeat confirmatory level in 6-8 hr Monitor for s/s bleeding or thrombosis  Tera Fellows, PharmD, BCPS [phone] 05/29/2023, 3:29 PM

## 2023-05-29 NOTE — Progress Notes (Signed)
   05/29/23 1424  Spiritual Encounters  Type of Visit Initial;Attempt (pt unavailable)  Conversation partners present during encounter Nurse  Reason for visit Trauma  OnCall Visit Yes   attempted visit, patient not available - soundly sleeping at the time of attempt.

## 2023-05-29 NOTE — Plan of Care (Incomplete)
  Problem: Education: Goal: Understanding of discharge needs will improve Outcome: Progressing Goal: Verbalization of understanding of the causes of altered bowel function will improve Outcome: Progressing   Problem: Activity: Goal: Ability to tolerate increased activity will improve Outcome: Progressing   Problem: Bowel/Gastric: Goal: Gastrointestinal status for postoperative course will improve Outcome: Progressing   Problem: Nutritional: Goal: Will attain and maintain optimal nutritional status will improve Outcome: Progressing   Problem: Skin Integrity: Goal: Will show signs of wound healing Outcome: Progressing   Problem: Education: Goal: Knowledge of General Education information will improve Description: Including pain rating scale, medication(s)/side effects and non-pharmacologic comfort measures Outcome: Progressing   Problem: Clinical Measurements: Goal: Cardiovascular complication will be avoided Outcome: Progressing   Problem: Activity: Goal: Risk for activity intolerance will decrease Outcome: Progressing   Problem: Nutrition: Goal: Adequate nutrition will be maintained Outcome: Progressing   Problem: Elimination: Goal: Will not experience complications related to bowel motility Outcome: Progressing   Problem: Pain Managment: Goal: General experience of comfort will improve and/or be controlled Outcome: Progressing   Problem: Safety: Goal: Ability to remain free from injury will improve Outcome: Progressing   Problem: Skin Integrity: Goal: Risk for impaired skin integrity will decrease Outcome: Progressing   Problem: Skin Integrity: Goal: Risk for impaired skin integrity will decrease Outcome: Progressing   Problem: Respiratory: Goal: Respiratory status will improve Outcome: Adequate for Discharge   Problem: Clinical Measurements: Goal: Respiratory complications will improve Outcome: Adequate for Discharge   Problem: Coping: Goal: Level  of anxiety will decrease Outcome: Adequate for Discharge

## 2023-05-29 NOTE — Progress Notes (Signed)
 PT Cancellation Note  Patient Details Name: Dominique Barber MRN: [id number] DOB: [birth year]   Cancelled Treatment:    Reason Eval/Treat Not Completed: Fatigue/lethargy limiting ability to participate. Pt reports up and ambulatory with nursing staff x 2 today and is currently resting with some throat and neck pain, addressed with tylenol  prior to PT arrival. PT to continue to follow acutely.   Cary Clarks, PT Acute Rehab   Dominique Barber 05/29/2023, 5:18 PM

## 2023-05-29 NOTE — Progress Notes (Signed)
   Patient Name: Dominique Barber Date of Encounter: 05/29/2023 [facility] Cardiologist: Randene Bustard, MD   Interval Summary  .    Has bowel sounds present but so far no flatus or BM.  Still on intravenous medications. No breathing difficulties.  Able to lie fully supine in bed without respiratory distress.  Denies chest pain.  Vital Signs .    Vitals:   05/29/23 0300 05/29/23 0400 05/29/23 0500 05/29/23 0600  BP: 108/67 119/65 127/74 110/76  Pulse: 69 60 60 67  Resp: 12 12 15 14   Temp:   97.8 F (36.6 C)   TempSrc:   Oral   SpO2: 93% 93% 96% 90%  Weight:   76.2 kg   Height:        Intake/Output Summary (Last 24 hours) at 05/29/2023 0827 Last data filed at 05/29/2023 0500 Gross per 24 hour  Intake 405.7 ml  Output 950 ml  Net -544.3 ml      05/29/2023    5:00 AM 05/28/2023    5:07 AM 05/27/2023    6:02 AM  Last 3 Weights  Weight (lbs) 167 lb 15.9 oz 168 lb 14 oz 175 lb 4.3 oz  Weight (kg) 76.2 kg 76.6 kg 79.5 kg      Telemetry/ECG    Atrial fibrillation with controlled ventricular response- Personally Reviewed  Physical Exam .   GEN: No acute distress.   Neck: No JVD Cardiac: Irregular , no murmurs, rubs, or gallops.  Respiratory: Clear to auscultation bilaterally. GI: Mildly distended, positive bowel sounds MS: No edema  Assessment & Plan .     Day #4 s/p right hemicolectomy for adenocarcinoma Sluggish return of intestinal transit. No evidence of coronary or heart failure complications. On intravenous anticoagulant and intravenous medications for atrial fibrillation rate control. Plan transition to oral Eliquis 5 mg twice daily and metoprolol  succinate 25 mg twice daily when able to take p.o. medications.  Meanwhile continue intravenous heparin  and intravenous  For questions or updates, please contact [facility] Please consult [email or web address] for contact info under        Signed, Luana Rumple, MD

## 2023-05-30 DIAGNOSIS — Z7901 Long term (current) use of anticoagulants: Secondary | ICD-10-CM

## 2023-05-30 DIAGNOSIS — Z5181 Encounter for therapeutic drug level monitoring: Secondary | ICD-10-CM | POA: Diagnosis not present

## 2023-05-30 DIAGNOSIS — I4891 Unspecified atrial fibrillation: Secondary | ICD-10-CM | POA: Diagnosis not present

## 2023-05-30 DIAGNOSIS — R7303 Prediabetes: Secondary | ICD-10-CM | POA: Diagnosis not present

## 2023-05-30 DIAGNOSIS — Z9049 Acquired absence of other specified parts of digestive tract: Secondary | ICD-10-CM | POA: Diagnosis not present

## 2023-05-30 LAB — BASIC METABOLIC PANEL WITH GFR
Anion gap: 10 (ref 5–15)
BUN: 11 mg/dL (ref 8–23)
CO2: 21 mmol/L — ABNORMAL LOW (ref 22–32)
Calcium: 7.9 mg/dL — ABNORMAL LOW (ref 8.9–10.3)
Chloride: 98 mmol/L (ref 98–111)
Creatinine, Ser: 1.05 mg/dL — ABNORMAL HIGH (ref 0.44–1.00)
GFR, Estimated: 58 mL/min — ABNORMAL LOW (ref >60)
Glucose, Bld: 95 mg/dL (ref 70–99)
Potassium: 3.5 mmol/L (ref 3.5–5.1)
Sodium: 129 mmol/L — ABNORMAL LOW (ref 135–145)

## 2023-05-30 LAB — GLUCOSE, CAPILLARY
Glucose-Capillary: 90 mg/dL (ref 70–99)
Glucose-Capillary: 94 mg/dL (ref 70–99)
Glucose-Capillary: 115 mg/dL — ABNORMAL HIGH (ref 70–99)
Glucose-Capillary: 134 mg/dL — ABNORMAL HIGH (ref 70–99)

## 2023-05-30 LAB — CBC
HCT: 32.1 % — ABNORMAL LOW (ref 36.0–46.0)
Hemoglobin: 9.5 g/dL — ABNORMAL LOW (ref 12.0–15.0)
MCH: 27.1 pg (ref 26.0–34.0)
MCHC: 29.6 g/dL — ABNORMAL LOW (ref 30.0–36.0)
MCV: 91.5 fL (ref 80.0–100.0)
Platelets: 360 10*3/uL (ref 150–400)
RBC: 3.51 MIL/uL — ABNORMAL LOW (ref 3.87–5.11)
RDW: 18.1 % — ABNORMAL HIGH (ref 11.5–15.5)
WBC: 9.1 10*3/uL (ref 4.0–10.5)
nRBC: 0.4 % — ABNORMAL HIGH (ref 0.0–0.2)

## 2023-05-30 LAB — HEPARIN LEVEL (UNFRACTIONATED): Heparin Unfractionated: 0.48 [IU]/mL (ref 0.30–0.70)

## 2023-05-30 MED ORDER — PREDNISONE 50 MG PO TABS
50.0000 mg | ORAL_TABLET | Freq: Four times a day (QID) | ORAL | Status: AC
  Administered 2023-05-30 (×3): 50 mg via ORAL
  Filled 2023-05-30 (×3): qty 1

## 2023-05-30 MED ORDER — SODIUM CHLORIDE (PF) 0.9 % IJ SOLN
INTRAMUSCULAR | Status: AC
  Filled 2023-05-30: qty 50

## 2023-05-30 MED ORDER — IOHEXOL 9 MG/ML PO SOLN
ORAL | Status: AC
  Filled 2023-05-30: qty 1000

## 2023-05-30 MED ORDER — DIPHENHYDRAMINE HCL 25 MG PO CAPS
50.0000 mg | ORAL_CAPSULE | Freq: Once | ORAL | Status: AC
  Administered 2023-05-30: 50 mg via ORAL
  Filled 2023-05-30: qty 2

## 2023-05-30 MED ORDER — LACTATED RINGERS IV SOLN: INTRAVENOUS | Status: AC

## 2023-05-30 NOTE — Progress Notes (Signed)
 Patient ID: Dominique Barber, female   DOB: 1955/04/26, 68 y.o.   MRN: [id number] Orders submitted for contrast allergy premedication in pt today prior to CT C/A/P with IV contrast ordered by Dr. Camilo Cella.

## 2023-05-30 NOTE — Progress Notes (Signed)
 PHARMACY - ANTICOAGULATION CONSULT NOTE  Pharmacy Consult for heparin   Indication: atrial fibrillation (PTA Xarelto  on hold)  Allergies  Allergen Reactions   Iodinated Contrast Media Hives and Dermatitis   Linzess [Linaclotide] Other (See Comments)    Caused horrible stomach pain and gas   Atorvastatin  Other (See Comments)    Gas pain in abdomen with no relief   Other Diarrhea and Other (See Comments)    IBS- Special diet   Milk-Related Compounds Diarrhea and Other (See Comments)    Has IBS   Rosuvastatin  Calcium  Other (See Comments)    Worsening GI upset   Simvastatin Other (See Comments)    Myalgia & GI upset   Eluxadoline Nausea And Vomiting and Other (See Comments)    Viberzi - stomach pain   Iohexol  Hives, Rash and Other (See Comments)   Marinol [Dronabinol] Palpitations and Other (See Comments)    "Made me feel high and almost passed out"   Red Dye #40 (Allura Red) Hives and Rash    Red contrast dye     Patient Measurements: Height: 5\' 2"  (157.5 cm) Weight: 74.2 kg (163 lb 9.3 oz) IBW/kg (Calculated) : 50.1 HEPARIN  DW (KG): 67.3  Vital Signs: Temp: 97.7 F (36.5 C) (05/26 2211) Temp Source: Oral (05/26 2211) BP: 118/74 (05/27 0600) Pulse Rate: 62 (05/27 0600)  Labs: Recent Labs    05/28/23 0502 05/28/23 1816 05/29/23 0458 05/29/23 1106 05/29/23 1237 05/29/23 2139 05/30/23 0426  HGB 10.6*  --   --  9.8*  --   --  9.5*  HCT 34.3*  --   --  32.7*  --   --  32.1*  PLT 374  --   --  349  --   --  360  HEPARINUNFRC  --    < > >1.10*  --  0.62 0.74* 0.48  CREATININE 1.32*  --  1.05*  --   --   --  1.05*   < > = values in this interval not displayed.    Estimated Creatinine Clearance: 49 mL/min (A) (by C-G formula based on SCr of 1.05 mg/dL (H)).   Medical History: Past Medical History:  Diagnosis Date   ACS (acute coronary syndrome) (HCC) 01/06/2020   With progressive angina while troponin elevation--borderline-NON-STEMI   ANTERIOR STEMI of LAD (x2).  01/19/1996   Complicated by VF arrest, and anoxic brain injury with status epilepticus; POBA of LAD --> 6 months later, after 2 vessel CABG she had postop complication with occluded LIMA/second anterior STEMI   CAD in native artery & Grafts 01/1996   a) 1/'98: Ant STEMI => LAD POBA; b) 2/'98: 95% prox LAD @ POBA site --> BMS PCI (3.0 mm x 25 mm); c) 7/98 (5 months later) => BMS ISR of LAD--> CABGx2 (LIMA-LAD, SVG-D1 -> Emergent redo w/ SVG-LAD b/c acute LIMA occlusion); d) 01/2020: ACS -> DES PCI of SVG-LAD (75%&85%), TO of SVG-D1.   COPD (chronic obstructive pulmonary disease) (HCC)    Dyspnea    GERD (gastroesophageal reflux disease)    Headache    Hiatal hernia    Hyperlipidemia    Hypertension    Hypothyroidism    On Synthroid    Iron deficiency anemia    Irritable bowel syndrome    Followed by Dr. Tami Falcon.   Ischemic cardiomyopathy 01/03/2020   ECHO (ACS): EF 45-50% (by Cath EF 40-45%) - Apical Inferior/Apical Anteroseptal & Apex akinetic, GR II DD.    PAF (paroxysmal atrial fibrillation) (HCC) 05/03/2014  New onset - originally with RVR   Pneumonia    x 2   PONV (postoperative nausea and vomiting)    very gag reflex   S/P CABG x 2; with EMERGENT Redo x 1. 07/1996   Initially LIMA-LAD, SVG-D1 --> immediate LIMA failure post CABG with anterior MI --> urgent redo SVG-LAD beyond D2.; Widely patent grafts as of 2005 with left dominant system. Graft to the diagonal branch has a very small target vessel but the vein graft to LAD retrograde fills a large bifurcating D2 and antegrade fills a large D3.   Tobacco abuse     Medications:  - on Xarelto  20 mg daily PTA  Assessment: Patient is a 68 y.o with hx afib on Xarelto  PTA and was recently diagnosed with colon cancer who presented to [facility] on 05/25/23 for right hemicolectomy.  She underwent lap right hemicolectomy on 05/25/23 with heparin  drip started post-op on 05/28/23.  Today, 05/30/2023: - heparin  level is therapeutic at 0.48 - cbc  somewhat stable - no bleeding documented   Goal of Therapy:  Heparin  level 0.3-0.7 units/ml Monitor platelets by anticoagulation protocol: Yes   Plan:  - continue heparin  drip at 700 units/hr - daily heparin  level and cbc - monitor for s/sx bleeding  Nastasia Kage P 05/30/2023,8:16 AM

## 2023-05-30 NOTE — Progress Notes (Signed)
 Physical Therapy Treatment Patient Details Name: Dominique Barber MRN: [id number] DOB: 04-Jul-1955 Today's Date: 05/30/2023   History of Present Illness 68 yo female  S/P laparoscopic  right COLECTOMYand   LYSIS of ADHESIONS n 5/22. PMH:CAD, CABG,PAF,ACS,COPD,IBS, GERD, Hypothyroid, HH, HTN.    PT Comments  Pt was OOB in recliner.  NG tube and tele.  AxO x 3 pleasant and motivated.  Assisted with amb in hallway.  General Gait Details: Used RW just for safety and multiple lines.  Expect will not need once home.  Pt progressing well with her mobility.  Did c/o increased weakness/fatigue NPO status. Pt plans to D/C to home.  No equipment needs.      If plan is discharge home, recommend the following: A little help with bathing/dressing/bathroom;Assistance with cooking/housework;Assist for transportation;Help with stairs or ramp for entrance   Can travel by private vehicle        Equipment Recommendations  None recommended by PT    Recommendations for Other Services       Precautions / Restrictions Precautions Precautions: Fall Precaution/Restrictions Comments: abdomen Restrictions Weight Bearing Restrictions Per Provider Order: No     Mobility  Bed Mobility               General bed mobility comments: OOB in recliner    Transfers Overall transfer level: Needs assistance Equipment used: Rolling walker (2 wheels), None Transfers: Sit to/from Stand Sit to Stand: Modified independent (Device/Increase time)   Step pivot transfers: Modified independent (Device/Increase time)       General transfer comment: good safety cognition and use of hands to steady self    Ambulation/Gait Ambulation/Gait assistance: Supervision Gait Distance (Feet): 125 Feet Assistive device: Rolling walker (2 wheels) Gait Pattern/deviations: Step-through pattern Gait velocity: decreased     General Gait Details: Used RW just for safety and multiple lines.  Expect will not need once  home.   Stairs             Wheelchair Mobility     Tilt Bed    Modified Rankin (Stroke Patients Only)       Balance                                            Communication Communication Communication: No apparent difficulties  Cognition Arousal: Alert Behavior During Therapy: WFL for tasks assessed/performed   PT - Cognitive impairments: No apparent impairments                       PT - Cognition Comments: AxO x 3 pleasant and motivated Following commands: Intact      Cueing Cueing Techniques: Verbal cues  Exercises      General Comments        Pertinent Vitals/Pain Pain Assessment Pain Assessment: Faces Pain Location: ABD Pain Descriptors / Indicators: Aching, Discomfort, Grimacing, Guarding Pain Intervention(s): Monitored during session, Premedicated before session, Repositioned    Home Living                          Prior Function            PT Goals (current goals can now be found in the care plan section) Progress towards PT goals: Progressing toward goals    Frequency    Min 2X/week      PT  Plan      Co-evaluation              AM-PAC PT "6 Clicks" Mobility   Outcome Measure  Help needed turning from your back to your side while in a flat bed without using bedrails?: None Help needed moving from lying on your back to sitting on the side of a flat bed without using bedrails?: None Help needed moving to and from a bed to a chair (including a wheelchair)?: None Help needed standing up from a chair using your arms (e.g., wheelchair or bedside chair)?: None Help needed to walk in hospital room?: None Help needed climbing 3-5 steps with a railing? : A Little 6 Click Score: 23    End of Session Equipment Utilized During Treatment: Gait belt Activity Tolerance: Patient tolerated treatment well Patient left: in chair;with call bell/phone within reach;with chair alarm set Nurse  Communication: Mobility status PT Visit Diagnosis: Unsteadiness on feet (R26.81);Difficulty in walking, not elsewhere classified (R26.2)     Time: 1135-1200 PT Time Calculation (min) (ACUTE ONLY): 25 min  Charges:    $Gait Training: 8-22 mins $Therapeutic Activity: 8-22 mins PT General Charges $$ ACUTE PT VISIT: 1 Visit                     Bess Broody  PTA Acute  Rehabilitation Services Office M-F          [phone]

## 2023-05-30 NOTE — Progress Notes (Signed)
 Patient refused PICC insertion as for this time per RN.Stated she wants to speak more to her husband regarding  having PICC .

## 2023-05-30 NOTE — Progress Notes (Signed)
 Dominique Barber is awaiting a CT scan tomorrow to see if she has a blockage.  She requested a prayer for healing and for hope that there is no blockage. I provided prayer and listening as she shared about her health history as well as some personal history including a traumatic event in her life. I will attempt to follow up tomorrow after the CT scan, but please also page as needs arise.

## 2023-05-30 NOTE — Progress Notes (Signed)
  Progress Note   Patient: Dominique Barber ONG:[id number] DOB: [birth year] DOA: 05/25/2023     5 DOS: the patient was seen and examined on 05/30/2023   Brief hospital course: 68 year old woman PMH including COPD without supplemental oxygen need, paroxysmal atrial fibrillation, who underwent elective right hemicolectomy for treatment of cecal adenocarcinoma.  Hospitalist were consulted postoperatively for medical management.  She developed atrial fibrillation with rapid ventricular response and was started on IV diltiazem  infusion and IV heparin .  These continue while she remains NPO.  Ileus remains, further imaging planned per general surgery.  Attending [facility]  Procedures/Events 5/22 right hemicolectomy   Assessment and Plan: Status post right hemicolectomy Cecal adenocarcinoma Post-operative ileus Abdominal pain, diarrhea Management per primary team   Acute renal insufficiency AKI considered but ruled out at this time Baseline creatinine around 1.1.  Appears to be at baseline. Follow-up BMP, continue IV fluids while n.p.o. Check magnesium and phosphorus in the a.m.   Paroxysmal atrial fibrillation Atrial fibrillation with rapid ventricular response Atrial fibrillation recurred, stopped amiodarone  given ileus Rapid rate not responsive to beta-blocker.  Continue diltiazem  infusion, appreciate cardiology consultation. IV heparin  per cardiology.   Resume Xarelto  when improved   Hyperglycemia Prediabetes Repeat hemoglobin A1c 6.3 here meeting definition of prediabetes. Recommend lifestyle modification and close follow-up with PCP. Recommend metformin if no diarrhea on discharge and renal function permits.   Hypothyroidism Resume levothyroxine  when able to take p.o.   Hypokalemia Hyponatremia  Monitor replenish as appropriate   Leukocytosis: Likely demargination Resolved       Subjective:  Feels ok   Physical Exam: Vitals:   05/30/23 0800 05/30/23 1000  05/30/23 1259 05/30/23 1400  BP: 125/64 108/61 109/70 (!) 118/96  Pulse: 73 62  87  Resp: 17 18  19   Temp:   97.7 F (36.5 C)   TempSrc:   Oral   SpO2: 100% 100%  96%  Weight:      Height:       Physical Exam Vitals reviewed.  Constitutional:      General: She is not in acute distress.    Appearance: She is not ill-appearing or toxic-appearing.  Cardiovascular:     Rate and Rhythm: Tachycardia present. Rhythm irregular.     Heart sounds: No murmur heard. Pulmonary:     Effort: Pulmonary effort is normal. No respiratory distress.     Breath sounds: No wheezing, rhonchi or rales.  Neurological:     Mental Status: She is alert.  Psychiatric:        Mood and Affect: Mood normal.        Behavior: Behavior normal.     Data Reviewed: CBG stable Sodium 129, stable Creatinine stable 1.05 Hemoglobin stable 9.5  Family Communication: none  Disposition: Status is: Inpatient Remains inpatient appropriate because: ileus     Time spent: 20 minutes  Author: Jerline Moon, MD 05/30/2023 5:59 PM  For on call review [email or web address].

## 2023-05-30 NOTE — Progress Notes (Signed)
   Patient Name: Dominique Barber Date of Encounter: 05/30/2023 [facility] Cardiologist: Randene Bustard, MD   Interval Summary  .    Still with NG tube draining   Vital Signs .    Vitals:   05/30/23 0346 05/30/23 0351 05/30/23 0400 05/30/23 0600  BP:  104/71 109/66 118/74  Pulse:  89 76 62  Resp:  (!) 22 17 17   Temp:      TempSrc:      SpO2:  94% 97% 100%  Weight: 74.2 kg     Height:        Intake/Output Summary (Last 24 hours) at 05/30/2023 0852 Last data filed at 05/30/2023 0346 Gross per 24 hour  Intake 641.07 ml  Output 1125 ml  Net -483.93 ml      05/30/2023    3:46 AM 05/29/2023    5:00 AM 05/28/2023    5:07 AM  Last 3 Weights  Weight (lbs) 163 lb 9.3 oz 167 lb 15.9 oz 168 lb 14 oz  Weight (kg) 74.2 kg 76.2 kg 76.6 kg      Telemetry/ECG    Atrial fibrillation with controlled ventricular response- Personally Reviewed  Physical Exam .   GEN: No acute distress.   Neck: No JVD Cardiac: Irregular , no murmurs, rubs, or gallops.  Respiratory: Clear to auscultation bilaterally. GI: Mildly distended, positive bowel sounds MS: No edema  Assessment & Plan .     Day #5 s/p right hemicolectomy for adenocarcinoma Sluggish return of intestinal transit. No evidence of coronary or heart failure complications. On intravenous anticoagulant and intravenous medications for atrial fibrillation rate control. Heparin  and cardizem   Plan transition to oral Eliquis 5 mg twice daily and metoprolol  succinate 25 mg twice daily when able to take p.o. medications.  Meanwhile continue intravenous heparin  and intravenous cardizem  Rates fine 70-80's this am  For questions or updates, please contact [facility] Please consult [email or web address] for contact info under        Signed, Janelle Mediate, MD

## 2023-05-30 NOTE — Progress Notes (Signed)
 5 Days Post-Op   Subjective/Chief Complaint: Some flatus, no BM yesterday. Had loose stool 2 nights ago Feels better with NGT    Objective: Vital signs in last 24 hours: Temp:  [97.5 F (36.4 C)-98 F (36.7 C)] 97.7 F (36.5 C) (05/26 2211) Pulse Rate:  [62-125] 62 (05/27 0600) Resp:  [12-22] 17 (05/27 0600) BP: (103-132)/(63-96) 118/74 (05/27 0600) SpO2:  [92 %-100 %] 100 % (05/27 0600) Weight:  [74.2 kg] 74.2 kg (05/27 0346) Last BM Date : 05/27/23  Intake/Output from previous day: 05/26 0701 - 05/27 0700 In: 641.1 [P.O.:10; I.V.:420.2; IV Piggyback:210.9] Out: 1125 [Urine:850; Emesis/NG output:275] Intake/Output this shift: Total I/O In: 292.5 [P.O.:10; I.V.:171.6; IV Piggyback:110.9] Out: 550 [Urine:550]  General appearance: alert and cooperative Resp: clear to auscultation bilaterally Cardio: NSR  Incision/Wound:CDI distended but some BS   Lab Results:  Recent Labs    05/29/23 1106 05/30/23 0426  WBC 9.5 9.1  HGB 9.8* 9.5*  HCT 32.7* 32.1*  PLT 349 360   BMET Recent Labs    05/29/23 0458 05/30/23 0426  NA 131* 129*  K 4.6 3.5  CL 99 98  CO2 22 21*  GLUCOSE 87 95  BUN 15 11  CREATININE 1.05* 1.05*  CALCIUM  8.2* 7.9*   PT/INR No results for input(s): "LABPROT", "INR" in the last 72 hours. ABG No results for input(s): "PHART", "HCO3" in the last 72 hours.  Invalid input(s): "PCO2", "PO2"  Studies/Results: DG Abd Portable 1V Result Date: 05/29/2023 CLINICAL DATA:  Ileus follow-up EXAM: PORTABLE ABDOMEN - 1 VIEW COMPARISON:  05/27/2023. FINDINGS: Mild small bowel dilatation without interval change. No stones or organomegaly. IMPRESSION: Persistent small bowel dilatation. Electronically Signed   By: Sydell Eva M.D.   On: 05/29/2023 09:21   DG CHEST PORT 1 VIEW Result Date: 05/29/2023 CLINICAL DATA:  Follow up ileus EXAM: PORTABLE CHEST 1 VIEW COMPARISON:  01/22/2022. FINDINGS: Cardiac silhouette is enlarged. Status post median sternotomy and  CABG. No pneumothorax. Small left-sided pleural effusion. Normal pulmonary vasculature. NG tube tip superimposed with stomach below the diaphragm. IMPRESSION: Small left-sided pleural effusion. Electronically Signed   By: Sydell Eva M.D.   On: 05/29/2023 09:20   US  EKG SITE RITE Result Date: 05/28/2023 If Site Rite image not attached, placement could not be confirmed due to current cardiac rhythm.   Anti-infectives: Anti-infectives (From admission, onward)    Start     Dose/Rate Route Frequency Ordered Stop   05/25/23 1400  neomycin (MYCIFRADIN) tablet 1,000 mg  Status:  Discontinued       Placed in "And" Linked Group   1,000 mg Oral 3 times per day 05/25/23 0835 05/25/23 0840   05/25/23 1400  metroNIDAZOLE (FLAGYL) tablet 1,000 mg  Status:  Discontinued       Placed in "And" Linked Group   1,000 mg Oral 3 times per day 05/25/23 0835 05/25/23 0840   05/25/23 0845  cefoTEtan (CEFOTAN) 2 g in sodium chloride  0.9 % 100 mL IVPB        2 g 200 mL/hr over 30 Minutes Intravenous On call to O.R. 05/25/23 3244 05/26/23 0809       Assessment/Plan: s/p Procedure(s) with comments: COLECTOMY, RIGHT, LAPAROSCOPIC (Right) - LAPAROSCOPIC RIGHT HEMICOLECTOMY LYSIS, ADHESIONS, LAPAROSCOPIC (N/A) ILEUS NOTED - continue NGT  NPO, MIVF Will check CT Abd/pelvis with contrast today for further evaluation A fib- rate better controlled   Appreciate medicine / cardiology assistance with this pt    LOS: 5 days    Veryl Gottron  Leane Profit  MD  05/30/2023

## 2023-05-31 ENCOUNTER — Other Ambulatory Visit: Payer: Self-pay

## 2023-05-31 ENCOUNTER — Encounter (HOSPITAL_COMMUNITY): Payer: Self-pay | Admitting: Surgery

## 2023-05-31 ENCOUNTER — Inpatient Hospital Stay (HOSPITAL_COMMUNITY)

## 2023-05-31 DIAGNOSIS — I4891 Unspecified atrial fibrillation: Secondary | ICD-10-CM | POA: Diagnosis not present

## 2023-05-31 DIAGNOSIS — Z9049 Acquired absence of other specified parts of digestive tract: Secondary | ICD-10-CM | POA: Diagnosis not present

## 2023-05-31 LAB — BASIC METABOLIC PANEL WITH GFR
Anion gap: 16 — ABNORMAL HIGH (ref 5–15)
BUN: 12 mg/dL (ref 8–23)
CO2: 17 mmol/L — ABNORMAL LOW (ref 22–32)
Calcium: 8.4 mg/dL — ABNORMAL LOW (ref 8.9–10.3)
Chloride: 98 mmol/L (ref 98–111)
Creatinine, Ser: 1.07 mg/dL — ABNORMAL HIGH (ref 0.44–1.00)
GFR, Estimated: 57 mL/min — ABNORMAL LOW (ref >60)
Glucose, Bld: 145 mg/dL — ABNORMAL HIGH (ref 70–99)
Potassium: 4.1 mmol/L (ref 3.5–5.1)
Sodium: 131 mmol/L — ABNORMAL LOW (ref 135–145)

## 2023-05-31 LAB — CBC
HCT: 30.4 % — ABNORMAL LOW (ref 36.0–46.0)
Hemoglobin: 9.4 g/dL — ABNORMAL LOW (ref 12.0–15.0)
MCH: 27.3 pg (ref 26.0–34.0)
MCHC: 30.9 g/dL (ref 30.0–36.0)
MCV: 88.4 fL (ref 80.0–100.0)
Platelets: 383 10*3/uL (ref 150–400)
RBC: 3.44 MIL/uL — ABNORMAL LOW (ref 3.87–5.11)
RDW: 18.5 % — ABNORMAL HIGH (ref 11.5–15.5)
WBC: 9.8 10*3/uL (ref 4.0–10.5)
nRBC: 0.4 % — ABNORMAL HIGH (ref 0.0–0.2)

## 2023-05-31 LAB — TSH: TSH: 1.078 u[IU]/mL (ref 0.350–4.500)

## 2023-05-31 LAB — GLUCOSE, CAPILLARY
Glucose-Capillary: 125 mg/dL — ABNORMAL HIGH (ref 70–99)
Glucose-Capillary: 133 mg/dL — ABNORMAL HIGH (ref 70–99)
Glucose-Capillary: 146 mg/dL — ABNORMAL HIGH (ref 70–99)
Glucose-Capillary: 153 mg/dL — ABNORMAL HIGH (ref 70–99)

## 2023-05-31 LAB — HEPARIN LEVEL (UNFRACTIONATED)
Heparin Unfractionated: 0.84 [IU]/mL — ABNORMAL HIGH (ref 0.30–0.70)
Heparin Unfractionated: 0.69 [IU]/mL (ref 0.30–0.70)

## 2023-05-31 LAB — MAGNESIUM: Magnesium: 2.2 mg/dL (ref 1.7–2.4)

## 2023-05-31 LAB — T4, FREE: Free T4: 1.14 ng/dL — ABNORMAL HIGH (ref 0.61–1.12)

## 2023-05-31 LAB — PHOSPHORUS: Phosphorus: 2.9 mg/dL (ref 2.5–4.6)

## 2023-05-31 MED ORDER — TRAVASOL 10 % IV SOLN
INTRAVENOUS | Status: AC
  Filled 2023-05-31: qty 470.4

## 2023-05-31 MED ORDER — IOHEXOL 300 MG/ML  SOLN
100.0000 mL | Freq: Once | INTRAMUSCULAR | Status: AC | PRN
  Administered 2023-05-31: 100 mL via INTRAVENOUS

## 2023-05-31 MED ORDER — LACTATED RINGERS IV SOLN: INTRAVENOUS | Status: AC

## 2023-05-31 MED ORDER — DIGOXIN 0.25 MG/ML IJ SOLN
0.2500 mg | Freq: Once | INTRAMUSCULAR | Status: AC
  Administered 2023-05-31: 0.25 mg via INTRAVENOUS
  Filled 2023-05-31: qty 2

## 2023-05-31 MED ORDER — SODIUM CHLORIDE 0.9% FLUSH: 10.0000 mL | INTRAVENOUS | Status: DC | PRN

## 2023-05-31 MED ORDER — METOPROLOL TARTRATE 5 MG/5ML IV SOLN: 5.0000 mg | INTRAVENOUS | Status: DC | PRN

## 2023-05-31 MED ORDER — INSULIN ASPART 100 UNIT/ML IJ SOLN
0.0000 [IU] | Freq: Four times a day (QID) | INTRAMUSCULAR | Status: DC
  Administered 2023-06-01 (×2): 2 [IU] via SUBCUTANEOUS
  Administered 2023-06-01: 1 [IU] via SUBCUTANEOUS
  Administered 2023-06-02 – 2023-06-03 (×4): 2 [IU] via SUBCUTANEOUS
  Administered 2023-06-03 (×2): 1 [IU] via SUBCUTANEOUS
  Administered 2023-06-03 – 2023-06-04 (×3): 2 [IU] via SUBCUTANEOUS
  Administered 2023-06-04 (×2): 1 [IU] via SUBCUTANEOUS
  Administered 2023-06-05: 2 [IU] via SUBCUTANEOUS
  Administered 2023-06-05: 1 [IU] via SUBCUTANEOUS

## 2023-05-31 MED ORDER — THIAMINE HCL 100 MG/ML IJ SOLN
100.0000 mg | Freq: Every day | INTRAMUSCULAR | Status: AC
  Administered 2023-05-31 – 2023-06-04 (×5): 100 mg via INTRAVENOUS
  Filled 2023-05-31 (×5): qty 2

## 2023-05-31 NOTE — Progress Notes (Signed)
 PHARMACY - ANTICOAGULATION CONSULT NOTE  Pharmacy Consult for heparin   Indication: atrial fibrillation (PTA Xarelto  on hold)  Allergies  Allergen Reactions   Iodinated Contrast Media Hives and Dermatitis   Linzess [Linaclotide] Other (See Comments)    Caused horrible stomach pain and gas   Atorvastatin  Other (See Comments)    Gas pain in abdomen with no relief   Other Diarrhea and Other (See Comments)    IBS- Special diet   Milk-Related Compounds Diarrhea and Other (See Comments)    Has IBS   Rosuvastatin  Calcium  Other (See Comments)    Worsening GI upset   Simvastatin Other (See Comments)    Myalgia & GI upset   Eluxadoline Nausea And Vomiting and Other (See Comments)    Viberzi - stomach pain   Iohexol  Hives, Rash and Other (See Comments)   Marinol [Dronabinol] Palpitations and Other (See Comments)    "Made me feel high and almost passed out"   Red Dye #40 (Allura Red) Hives and Rash    Red contrast dye     Patient Measurements: Height: 5\' 2"  (157.5 cm) Weight: 73.9 kg (162 lb 14.7 oz) IBW/kg (Calculated) : 50.1 HEPARIN  DW (KG): 67.3  Vital Signs: Temp: 97.6 F (36.4 C) (05/28 0537) Temp Source: Oral (05/28 0537) BP: 138/81 (05/28 0800) Pulse Rate: 97 (05/28 0537)  Labs: Recent Labs     0000 05/29/23 0458 05/29/23 1106 05/29/23 1237 05/30/23 0426 05/31/23 0504 05/31/23 0630  HGB   < >  --  9.8*  --  9.5* 9.4*  --   HCT  --   --  32.7*  --  32.1* 30.4*  --   PLT  --   --  349  --  360 383  --   HEPARINUNFRC  --  >1.10*  --    < > 0.48 0.84* 0.69  CREATININE  --  1.05*  --   --  1.05* 1.07*  --    < > = values in this interval not displayed.    Estimated Creatinine Clearance: 48 mL/min (A) (by C-G formula based on SCr of 1.07 mg/dL (H)).   Medical History: Past Medical History:  Diagnosis Date   ACS (acute coronary syndrome) (HCC) 01/06/2020   With progressive angina while troponin elevation--borderline-NON-STEMI   ANTERIOR STEMI of LAD (x2).  01/19/1996   Complicated by VF arrest, and anoxic brain injury with status epilepticus; POBA of LAD --> 6 months later, after 2 vessel CABG she had postop complication with occluded LIMA/second anterior STEMI   CAD in native artery & Grafts 01/1996   a) 1/'98: Ant STEMI => LAD POBA; b) 2/'98: 95% prox LAD @ POBA site --> BMS PCI (3.0 mm x 25 mm); c) 7/98 (5 months later) => BMS ISR of LAD--> CABGx2 (LIMA-LAD, SVG-D1 -> Emergent redo w/ SVG-LAD b/c acute LIMA occlusion); d) 01/2020: ACS -> DES PCI of SVG-LAD (75%&85%), TO of SVG-D1.   COPD (chronic obstructive pulmonary disease) (HCC)    Dyspnea    GERD (gastroesophageal reflux disease)    Headache    Hiatal hernia    Hyperlipidemia    Hypertension    Hypothyroidism    On Synthroid    Iron deficiency anemia    Irritable bowel syndrome    Followed by Dr. Tami Falcon.   Ischemic cardiomyopathy 01/03/2020   ECHO (ACS): EF 45-50% (by Cath EF 40-45%) - Apical Inferior/Apical Anteroseptal & Apex akinetic, GR II DD.    PAF (paroxysmal atrial fibrillation) (HCC) 05/03/2014  New onset - originally with RVR   Pneumonia    x 2   PONV (postoperative nausea and vomiting)    very gag reflex   S/P CABG x 2; with EMERGENT Redo x 1. 07/1996   Initially LIMA-LAD, SVG-D1 --> immediate LIMA failure post CABG with anterior MI --> urgent redo SVG-LAD beyond D2.; Widely patent grafts as of 2005 with left dominant system. Graft to the diagonal branch has a very small target vessel but the vein graft to LAD retrograde fills a large bifurcating D2 and antegrade fills a large D3.   Tobacco abuse     Medications:  - on Xarelto  20 mg daily PTA  Assessment: Patient is a 68 y.o with hx afib on Xarelto  PTA and was recently diagnosed with colon cancer who [facility] for right hemicolectomy.  She underwent lap right hemicolectomy on 05/25/23 with heparin  drip started post-op on 05/28/23.  Today, 05/31/2023: - heparin  level is therapeutic at 0.69 but is  at the higher end of therapeutic range - Hgb down slightly 9.4, plts 383K - no bleeding documented   Goal of Therapy:  Heparin  level 0.3-0.7 units/ml Monitor platelets by anticoagulation protocol: Yes   Plan:  - Reduce heparin  drip slightly to 650 units/hr - daily heparin  level and cbc - monitor for s/sx bleeding  Vere Diantonio P 05/31/2023,8:23 AM

## 2023-05-31 NOTE — Progress Notes (Signed)
 Nutrition Follow-up  DOCUMENTATION CODES:   Not applicable  INTERVENTION:  - TPN to start tonight @ 72mL/hr.   - TPN management per pharmacy.   - Monitor magnesium, potassium, and phosphorus BID for at least 3 days, MD to replete as needed, as pt is at risk for refeeding syndrome given minimal intake >/= 6 days.  - Daily weights while on TPN.   NUTRITION DIAGNOSIS:   Inadequate oral intake related to altered GI function (ileus) as evidenced by NPO status. *new  GOAL:   Patient will meet greater than or equal to 90% of their needs *progressing, starting TPN  MONITOR:   Diet advancement, Labs, Weight trends  REASON FOR ASSESSMENT:   Consult New TPN/TNA  ASSESSMENT:   Pt with PMH of COPD, Afib, and DM admitted for elective R hemicolectomy for cecal adenocarcinoma  5/22 Admit; s/p Laparoscopic right hemicolectomy; CLD 5/23 FLD -> Soft diet 5/24 CLD -> NPO; NGT placed for suction d/t ileus 5/28 TPN to be initiated  Patient just finishing up taking a walk around the unit at time of visit. NGT to suction.  She reports a UBW of 160# and denies any changes in weight recently. Per EMR, weight stable over the past year.   She endorses typically eating 3 small meals a day at home. States she follows the FODMAP diet due to IBS. Usually eats oatmeal for breakfast, a [place] sandwich for lunch, and soup for dinner. Eating normally up until admission.   Minimal intake documented while patient was on an oral diet at the beginning of admission. She has now been NPO x4 days.  Plan for PICC placement and to start TPN this evening. Answered all patient's questions about TPN.   Admit weight: 158# Current weight: 162# I&O's: +956mL since admit  Medications reviewed and include: 100mg  thiamine  (x5 days)  Labs reviewed:  Na 131 Creatinine 1.07 HA1C 6.3 Blood Glucose 90-146 x24 hours Triglycerides (not checked)   NUTRITION - FOCUSED PHYSICAL EXAM:  Flowsheet Row Most Recent  Value  Orbital Region No depletion  Upper Arm Region No depletion  Thoracic and Lumbar Region Unable to assess  Buccal Region No depletion  Temple Region No depletion  Clavicle Bone Region No depletion  Clavicle and Acromion Bone Region No depletion  Scapular Bone Region Unable to assess  Dorsal Hand No depletion  Patellar Region No depletion  Anterior Thigh Region No depletion  Posterior Calf Region No depletion  Edema (RD Assessment) None  Hair Reviewed  Eyes Reviewed  Mouth Reviewed  Skin Reviewed  Nails Reviewed       Diet Order:   Diet Order             Diet NPO time specified Except for: Ice Chips, Sips with Meds  Diet effective now                   EDUCATION NEEDS:  Education needs have been addressed  Skin:  Skin Assessment: Reviewed RN Assessment (abd incision)  Last BM:  5/23  Height:  Ht Readings from Last 1 Encounters:  05/26/23 5\' 2"  (1.575 m)   Weight:  Wt Readings from Last 1 Encounters:  05/31/23 73.9 kg    BMI:  Body mass index is 29.8 kg/m.  Estimated Nutritional Needs:  Kcal:  1800-2000 Protein:  90-100 grams Fluid:  >1.8 L/day    Scheryl Cushing RD, LDN Contact via Secure Chat.

## 2023-05-31 NOTE — Progress Notes (Signed)
 PHARMACY - TOTAL PARENTERAL NUTRITION CONSULT NOTE   Indication: Prolonged ileus  Patient Measurements: Height: 5\' 2"  (157.5 cm) Weight: 73.9 kg (162 lb 14.7 oz) IBW/kg (Calculated) : 50.1 TPN AdjBW (KG): 57.1 Body mass index is 29.8 kg/m. Usual Weight:   Assessment: Patient is a 68 y.o F with a recent diagnosis of colon cancer who presented to [facility] on 05/25/23 for right hemicolectomy. Pharmacy has been consulted on 05/31/23 to start TPN for ileus.  Glucose / Insulin: A1c 6.3; no hx DM - on very sensitive SSI TID with meals (has not used any insulin in the past 24 hrs) - CBGs (goal <150): 90-146 Electrolytes: Na low 131, Ca 8.4 (no Albumin level collected with this adm), CO2 low 17 - other lytes wnl  - goal for ileus: Mag >2, K >4  Renal: scr 1.07 (crcl~48), BUN wnl Hepatic: no labs collected with this adm Intake / Output; MIVF: LR @75  ml/hr - I/O: -875 mL in 24 hrs - UOP: 1000 mL - NG/GT: 60 mL - last BM: 05/26/23 GI Imaging: - 5/24 and Xray: Mildly dilated loops of small bowel.This is nonspecific and could represent ileus or partial small bowel obstruction. - 5/26 abd Xray: Persistent small bowel dilatation  - 5/28 abd CT: Possible small-bowel obstruction with transition point in the right anterior abdomen. Primary differential consideration is enteritis with ileus as there is wall thickening and mucosal hyperenhancement of the small bowel immediately downstream from the transition point. GI Surgeries / Procedures:  - 5/22: Laparoscopic right hemicolectomy   Central access: pending PICC placement  TPN start date: 5/28- pending PICC placement   Nutritional Goals: Goal TPN rate is 80 mL/hr (provides 94 g of protein and 1959 kcals per day)  RD Assessment:  Estimated Needs Total Energy Estimated Needs: 1800-2000 Total Protein Estimated Needs: 90-100 grams Total Fluid Estimated Needs: >1.8 L/day  Current Nutrition:  NPO  - has NGT  Plan:   At 1800: - Start TPN at  40 mL/hr at 1800 - Electrolytes in TPN:  Na 150mEq/L K 50mEq/L Ca 21mEq/L Mg 102mEq/L Phos 15mmol/L Cl:Ac 1:2 - Add standard MVI and trace elements to TPN - change SSI to sensitive q6h and adjust as needed  - Thiamine 100 mg daily x5 days (5/28 - 6/1) - Reduce MIVF to 35 mL/hr at 1800 - Monitor TPN labs on Mon/Thurs at a minimum - CMET, phos, Mag daily thru 06/05/23 for now  Jnai Snellgrove P 05/31/2023,7:36 AM

## 2023-05-31 NOTE — Progress Notes (Addendum)
 PROGRESS NOTE  Dominique Barber  DOB: 20-Oct-1955  PCP: Abraham Abo, MD ZOX:[id number]  DOA: 05/25/2023  LOS: 6 days  Hospital Day: 7  Brief narrative: Dominique Barber is a 68 y.o. female with PMH significant for HTN, HLD, CAD with h/o V-fib arrest and subsequent CABG, ischemic cardiomyopathy, paroxysmal A-fib, COPD, not on oxygen at home. 03/17/2023, patient underwent colonoscopy by Dr. Nickey Barn for iron deficiency anemia.  She was found to have tumor in the ascending colon, biopsy showed moderately differentiated invasive adenocarcinoma. She was seen by general surgery and as planned underwent elective right hemicolectomy on 5/22. Hospitalist service was consulted for postoperative medical management. 5/22, cardiology service was also consulted for A-fib with RVR  Subjective: Patient was seen and examined this morning.  Pleasant elderly Caucasian female. Propped up in bed. Currently in postop ileus.  NG tube attached to suction. Has not passed any gas or bowel movement.  Chart reviewed In the last 24 hours, afebrile, heart rate in 90s, blood pressure normalized, breathing on room air Labs from this morning with WC count 9.8, hemoglobin 9.4, sodium 131 CT abdomen from last night showed possible small-bowel obstruction with transition point in the right anterior abdomen.  Assessment and plan: Colon cancer S/p right hemicolectomy 5/22 Dr. Camilo Cella Post-operative ileus Currently remains in postoperative ileus.  General surgery following. NG tube attached to suction. Per general surgery, CT abdomen findings from last night is suggestive of ileus and not a clear obstruction. Noted a plan to start TPN today Pain management with tramadol as needed and Dilaudid  as needed Monitor electrolytes Recent Labs  Lab 05/27/23 0553 05/28/23 0502 05/29/23 0458 05/30/23 0426 05/31/23 0504  K 3.4* 5.1 4.6 3.5 4.1  MG 2.1 2.3 2.5*  --  2.2  PHOS  --  2.7  --   --  2.9   Hyponatremia Mild  likely because of GI fluid loss and no oral intake. Currently on LR.  Continue to monitor Recent Labs  Lab 05/26/23 0436 05/27/23 0553 05/28/23 0502 05/29/23 0458 05/30/23 0426 05/31/23 0504  NA 135 130* 131* 131* 129* 131*   A-fib with RVR  H/o paroxysmal A-fib 5/25, patient went to RVR.  Cardizem  drip was started Cardiology was consulted Remained on Cardizem  drip.   For anticoag patient, patient has also been started on heparin  drip.  Plan to switch to Xarelto  when oral intake is insured  CAD/CABG Currently does not have any anginal symptoms.   PTA meds-Xarelto , PCSK9i as an outpatient Xarelto  plan as above  Hyperglycemia A1c 6.3 on 05/27/2023 Sugar level may rise up with TPN. Continue SSI/Accu-Cheks Recent Labs  Lab 05/30/23 1101 05/30/23 1606 05/30/23 2141 05/31/23 0723 05/31/23 1123  GLUCAP 94 115* 134* 146* 133*    Hypothyroidism To resume levothyroxine  when able to take p.o.  Acute urinary retention Foley catheter in place. To plan voiding trial when okay with primary team    Mobility: Encourage ambulation.  Uses a walker  Goals of care   Code Status: Full Code     DVT prophylaxis:  SCD's Start: 05/25/23 1501 Place TED hose Start: 05/25/23 1501   Antimicrobials: None Fluid: Currently LR at 75 mL/h.  Noted plan of TPN Family Communication: None at bedside  Status: Inpatient Level of care:  Progressive   Patient is from: Home Needs to continue in-hospital care: In postop ileus Anticipated d/c to: Hopefully home after clinical improvement   Diet:  Diet Order  Diet NPO time specified Except for: [employer], Sips with Meds  Diet effective now                   Scheduled Meds:  Chlorhexidine Gluconate Cloth  6 each Topical Daily   insulin aspart  0-9 Units Subcutaneous Q6H   mouth rinse  15 mL Mouth Rinse 4 times per day   thiamine (VITAMIN B1) injection  100 mg Intravenous Daily    PRN meds: albuterol , diphenhydrAMINE   **OR** diphenhydrAMINE , hydrALAZINE , HYDROmorphone  (DILAUDID ) injection, lidocaine , lip balm, methocarbamol  (ROBAXIN ) injection, metoprolol  tartrate, nitroGLYCERIN , ondansetron  **OR** ondansetron  (ZOFRAN ) IV, mouth rinse, prochlorperazine, simethicone , traMADol   Infusions:   diltiazem  (CARDIZEM ) infusion 15 mg/hr (05/31/23 0741)   heparin  650 Units/hr (05/31/23 0826)   lactated ringers  75 mL/hr at 05/30/23 2221   lactated ringers      TPN ADULT (ION)      Antimicrobials: Anti-infectives (From admission, onward)    Start     Dose/Rate Route Frequency Ordered Stop   05/25/23 1400  neomycin (MYCIFRADIN) tablet 1,000 mg  Status:  Discontinued       Placed in "And" Linked Group   1,000 mg Oral 3 times per day 05/25/23 0835 05/25/23 0840   05/25/23 1400  metroNIDAZOLE (FLAGYL) tablet 1,000 mg  Status:  Discontinued       Placed in "And" Linked Group   1,000 mg Oral 3 times per day 05/25/23 0835 05/25/23 0840   05/25/23 0845  cefoTEtan (CEFOTAN) 2 g in sodium chloride  0.9 % 100 mL IVPB        2 g 200 mL/hr over 30 Minutes Intravenous On call to O.R. 05/25/23 0835 05/26/23 0809       Objective: Vitals:   05/31/23 0800 05/31/23 1000  BP: 138/81 123/70  Pulse:    Resp: 19 17  Temp:    SpO2: 98% 95%    Intake/Output Summary (Last 24 hours) at 05/31/2023 1200 Last data filed at 05/31/2023 0539 Gross per 24 hour  Intake 794.79 ml  Output 1700 ml  Net -905.21 ml   Filed Weights   05/29/23 0500 05/30/23 0346 05/31/23 0537  Weight: 76.2 kg 74.2 kg 73.9 kg   Weight change: -0.3 kg Body mass index is 29.8 kg/m.   Physical Exam: General exam: Pleasant, elderly Caucasian female. Skin: No rashes, lesions or ulcers. HEENT: Atraumatic, normocephalic, no obvious bleeding.  Has NG tube attached to suction Lungs: Clear to auscultation bilaterally,  CVS: S1, S2, no murmur,   GI/Abd: Soft, postop tenderness present, nondistended, bowel sound sluggish,   CNS: Alert, awake, oriented x  3 Psychiatry: Mood appropriate,  Extremities: No pedal edema, no calf tenderness,   Data Review: I have personally reviewed the laboratory data and studies available.  F/u labs ordered Unresulted Labs (From admission, onward)     Start     Ordered   06/05/23 0500  Triglycerides  (Standard TPN Labs (all labs to be drawn at 0500))  Every Monday (0500),   R     Question:  Specimen collection method  Answer:  Lab=Lab collect   05/31/23 0647   06/02/23 0500  Comprehensive metabolic panel  Daily,   R     Question:  Specimen collection method  Answer:  Lab=Lab collect   05/31/23 0817   06/02/23 0500  Magnesium  Daily,   R     Question:  Specimen collection method  Answer:  Lab=Lab collect   05/31/23 0817   06/02/23 0500  Phosphorus  Daily,   R     Question:  Specimen collection method  Answer:  Lab=Lab collect   05/31/23 0817   06/01/23 0500  Comprehensive metabolic panel  (Standard TPN Labs (all labs to be drawn at 0500))  Every Mon,Thu (0500),   R     Question:  Specimen collection method  Answer:  Lab=Lab collect   05/31/23 0647   06/01/23 0500  Magnesium  (Standard TPN Labs (all labs to be drawn at 0500))  Every Mon,Thu (0500),   R     Question:  Specimen collection method  Answer:  Lab=Lab collect   05/31/23 0647   06/01/23 0500  Phosphorus  (Standard TPN Labs (all labs to be drawn at 0500))  Every Mon,Thu (0500),   R     Question:  Specimen collection method  Answer:  Lab=Lab collect   05/31/23 0647   05/29/23 0500  CBC  Daily,   R      05/28/23 1104   05/29/23 0500  Heparin  level (unfractionated)  Daily,   R      05/28/23 1930            Signed, Hoyt Macleod, MD [facility] 05/31/2023

## 2023-05-31 NOTE — Progress Notes (Signed)
 6 Days Post-Op   Subjective/Chief Complaint: Some flatus, no BM.  Denies any abdominal pain.  Objective: Vital signs in last 24 hours: Temp:  [97.6 F (36.4 C)-98.1 F (36.7 C)] 97.6 F (36.4 C) (05/28 0537) Pulse Rate:  [62-107] 97 (05/28 0537) Resp:  [18-19] 19 (05/28 0800) BP: (108-138)/(61-96) 138/81 (05/28 0800) SpO2:  [96 %-100 %] 98 % (05/28 0800) Weight:  [73.9 kg] 73.9 kg (05/28 0537) Last BM Date : 05/26/23  Intake/Output from previous day: 05/27 0701 - 05/28 0700 In: 824.8 [I.V.:764.8; NG/GT:60] Out: 1700 [Urine:1000; Emesis/NG output:700] Intake/Output this shift: No intake/output data recorded.  General appearance: alert and cooperative Resp: clear to auscultation bilaterally Cardio: NSR  Incision/Wound:mild to moderate distention; incision c/d/I; not significantly tender  Lab Results:  Recent Labs    05/30/23 0426 05/31/23 0504  WBC 9.1 9.8  HGB 9.5* 9.4*  HCT 32.1* 30.4*  PLT 360 383   BMET Recent Labs    05/30/23 0426 05/31/23 0504  NA 129* 131*  K 3.5 4.1  CL 98 98  CO2 21* 17*  GLUCOSE 95 145*  BUN 11 12  CREATININE 1.05* 1.07*  CALCIUM  7.9* 8.4*   PT/INR No results for input(s): "LABPROT", "INR" in the last 72 hours. ABG No results for input(s): "PHART", "HCO3" in the last 72 hours.  Invalid input(s): "PCO2", "PO2"  Studies/Results: US  EKG SITE RITE Result Date: 05/31/2023 If Site Rite image not attached, placement could not be confirmed due to current cardiac rhythm.  CT ABDOMEN PELVIS W CONTRAST Result Date: 05/31/2023 CLINICAL DATA:  Bowel obstruction suspected, nausea, vomiting, abdominal pain, status post colectomy for tumor removal and lysis of adhesions on May 22nd. EXAM: CT ABDOMEN AND PELVIS WITH CONTRAST TECHNIQUE: Multidetector CT imaging of the abdomen and pelvis was performed using the standard protocol following bolus administration of intravenous contrast. RADIATION DOSE REDUCTION: This exam was performed according to  the departmental dose-optimization program which includes automated exposure control, adjustment of the mA and/or kV according to patient size and/or use of iterative reconstruction technique. CONTRAST:  OMNIPAQUE  IOHEXOL  300 MG/ML  SOLN COMPARISON:  Abdominal radiographs 05/29/2023; MRI abdomen 04/13/2023; CT abdomen pelvis 03/29/2023 FINDINGS: Lower chest: No acute abnormality. Hepatobiliary: Scattered hypoattenuating lesions too small to definitively characterize. Cholecystectomy. No biliary dilation. Pancreas: Fatty atrophy.  No acute abnormality. Spleen: Unremarkable. Adrenals/Urinary Tract: Normal adrenal glands. Bilateral cortical renal scarring. No urinary calculi or hydronephrosis. Nondistended bladder about a Foley catheter. Locules of gas in the bladder. Stomach/Bowel: Enteric tube tip in the stomach. Stomach is within normal limits. Dilated loops of small bowel with air-fluid levels. There is tapering to a transition point in the right anterior abdomen. (Series 6/image 56). The small bowel downstream from this demonstrates mild wall thickening and hyperenhancement. Postoperative change of right hemicolectomy with ileal colonic anastomosis in the anterior mid abdomen. Normal caliber colon without wall thickening. Vascular/Lymphatic: Aortic atherosclerosis. No enlarged abdominal or pelvic lymph nodes. Reproductive: Hyperenhancing uterine fibroid.  No adnexal mass. Other: Trace stranding in the anterior peritoneum is likely postoperative. No organized fluid collection or abscess. No free intraperitoneal air. Musculoskeletal: No acute fracture. Edema in the left greater than right flanks. IMPRESSION: 1. Possible small-bowel obstruction with transition point in the right anterior abdomen. Primary differential consideration is enteritis with ileus as there is wall thickening and mucosal hyperenhancement of the small bowel immediately downstream from the transition point. 2. Postoperative change of right  hemicolectomy with ileal colonic anastomosis in the anterior mid abdomen. 3. Aortic  Atherosclerosis (ICD10-I70.0). Electronically Signed   By: Rozell Cornet M.D.   On: 05/31/2023 01:07    Anti-infectives: Anti-infectives (From admission, onward)    Start     Dose/Rate Route Frequency Ordered Stop   05/25/23 1400  neomycin (MYCIFRADIN) tablet 1,000 mg  Status:  Discontinued       Placed in "And" Linked Group   1,000 mg Oral 3 times per day 05/25/23 0835 05/25/23 0840   05/25/23 1400  metroNIDAZOLE (FLAGYL) tablet 1,000 mg  Status:  Discontinued       Placed in "And" Linked Group   1,000 mg Oral 3 times per day 05/25/23 0835 05/25/23 0840   05/25/23 0845  cefoTEtan (CEFOTAN) 2 g in sodium chloride  0.9 % 100 mL IVPB        2 g 200 mL/hr over 30 Minutes Intravenous On call to O.R. 05/25/23 1610 05/26/23 0809       Assessment/Plan: s/p Procedure(s) with comments: COLECTOMY, RIGHT, LAPAROSCOPIC (Right) - LAPAROSCOPIC RIGHT HEMICOLECTOMY LYSIS, ADHESIONS, LAPAROSCOPIC (N/A)  ILEUS NOTED - continue NGT; will plan to start TPN today; PICC line being requested.  All of this was discussed with her and rationale therein.  She expressed understanding and agreement.  NPO, MIVF A fib- rate better controlled   CT overnight demonstrates findings that I feel are most consistent clinically with an ileus.  I do not think she has a clear obstruction.  There is gas that even traverses the anastomosis and gas noted in the colon.  There is no mesenteric twisting or other concerning features.  All the above has been reviewed with her.  Hopefully over the coming days, things begin to improve and open up.  In the meantime, plan to continue the NG tube.  IV based medications for critical medications that she is unlikely able to absorb much enterally with an NG.  From an anticoagulation perspective, she is on a heparin  drip.  Noted plans for Eliquis once tolerating oral.  Appreciate medicine / cardiology  assistance with this pt    LOS: 6 days    Melvenia Stabs  MD  05/31/2023

## 2023-05-31 NOTE — Progress Notes (Signed)
 Progress Note  Patient Name: Dominique Barber Date of Encounter: 05/31/2023  Primary Cardiologist: Randene Bustard, MD  Subjective   C/o upper abd discomfort which she feels is related to her hiatal hernia, not excited about PICC line. No CP or SOB. Remains in AF.  She was frustrated because her walk was deferred yesterday PM due to HR not being optimally controlled, hopeful to walk this AM.  Inpatient Medications    Scheduled Meds:  Chlorhexidine  Gluconate Cloth  6 each Topical Daily   insulin  aspart  0-9 Units Subcutaneous Q6H   mouth rinse  15 mL Mouth Rinse 4 times per day   thiamine  (VITAMIN B1) injection  100 mg Intravenous Daily   Continuous Infusions:  diltiazem  (CARDIZEM ) infusion 15 mg/hr (05/31/23 0741)   heparin  650 Units/hr (05/31/23 0826)   lactated ringers  75 mL/hr at 05/30/23 2221   lactated ringers      TPN ADULT (ION)     PRN Meds: albuterol , diphenhydrAMINE  **OR** diphenhydrAMINE , hydrALAZINE , HYDROmorphone  (DILAUDID ) injection, lidocaine , lip balm, methocarbamol  (ROBAXIN ) injection, nitroGLYCERIN , ondansetron  **OR** ondansetron  (ZOFRAN ) IV, mouth rinse, prochlorperazine , simethicone , traMADol    Vital Signs    Vitals:   05/30/23 1400 05/30/23 2144 05/31/23 0537 05/31/23 0800  BP: (!) 118/96 128/67  138/81  Pulse: 87 (!) 107 97   Resp: 19   19  Temp:  98.1 F (36.7 C) 97.6 F (36.4 C)   TempSrc:  Oral Oral   SpO2: 96% 98% 98% 98%  Weight:   73.9 kg   Height:        Intake/Output Summary (Last 24 hours) at 05/31/2023 0941 Last data filed at 05/31/2023 0539 Gross per 24 hour  Intake 824.79 ml  Output 1700 ml  Net -875.21 ml      05/31/2023    5:37 AM 05/30/2023    3:46 AM 05/29/2023    5:00 AM  Last 3 Weights  Weight (lbs) 162 lb 14.7 oz 163 lb 9.3 oz 167 lb 15.9 oz  Weight (kg) 73.9 kg 74.2 kg 76.2 kg     Telemetry    AF 80s-120s - Personally Reviewed  Physical Exam   GEN: No acute distress.  HEENT: Normocephalic, atraumatic, sclera  non-icteric. Neck: No JVD or bruits. Cardiac: irregularly irregular, borderline ULN heart rate, no murmurs, rubs, or gallops.  Respiratory: Clear to auscultation bilaterally. Breathing is unlabored. GI: Soft, nontender, NGT in place  MS: no deformity. Extremities: No clubbing or cyanosis. No edema. Distal pedal pulses are 2+ and equal bilaterally. Neuro:  AAOx3. Follows commands. Psych:  Responds to questions appropriately with a normal affect.  Labs    High Sensitivity Troponin:  No results for input(s): "TROPONINIHS" in the last 720 hours.    Cardiac EnzymesNo results for input(s): "TROPONINI" in the last 168 hours. No results for input(s): "TROPIPOC" in the last 168 hours.   Chemistry Recent Labs  Lab 05/29/23 0458 05/30/23 0426 05/31/23 0504  NA 131* 129* 131*  K 4.6 3.5 4.1  CL 99 98 98  CO2 22 21* 17*  GLUCOSE 87 95 145*  BUN 15 11 12   CREATININE 1.05* 1.05* 1.07*  CALCIUM  8.2* 7.9* 8.4*  GFRNONAA 58* 58* 57*  ANIONGAP 10 10 16*     Hematology Recent Labs  Lab 05/29/23 1106 05/30/23 0426 05/31/23 0504  WBC 9.5 9.1 9.8  RBC 3.65* 3.51* 3.44*  HGB 9.8* 9.5* 9.4*  HCT 32.7* 32.1* 30.4*  MCV 89.6 91.5 88.4  MCH 26.8 27.1 27.3  MCHC 30.0 29.6* 30.9  RDW 17.8* 18.1* 18.5*  PLT 349 360 383    BNPNo results for input(s): "BNP", "PROBNP" in the last 168 hours.   DDimer No results for input(s): "DDIMER" in the last 168 hours.   Radiology    US  EKG SITE RITE Result Date: 05/31/2023 If Site Rite image not attached, placement could not be confirmed due to current cardiac rhythm.  CT ABDOMEN PELVIS W CONTRAST Result Date: 05/31/2023 CLINICAL DATA:  Bowel obstruction suspected, nausea, vomiting, abdominal pain, status post colectomy for tumor removal and lysis of adhesions on May 22nd. EXAM: CT ABDOMEN AND PELVIS WITH CONTRAST TECHNIQUE: Multidetector CT imaging of the abdomen and pelvis was performed using the standard protocol following bolus administration of  intravenous contrast. RADIATION DOSE REDUCTION: This exam was performed according to the departmental dose-optimization program which includes automated exposure control, adjustment of the mA and/or kV according to patient size and/or use of iterative reconstruction technique. CONTRAST:  OMNIPAQUE  IOHEXOL  300 MG/ML  SOLN COMPARISON:  Abdominal radiographs 05/29/2023; MRI abdomen 04/13/2023; CT abdomen pelvis 03/29/2023 FINDINGS: Lower chest: No acute abnormality. Hepatobiliary: Scattered hypoattenuating lesions too small to definitively characterize. Cholecystectomy. No biliary dilation. Pancreas: Fatty atrophy.  No acute abnormality. Spleen: Unremarkable. Adrenals/Urinary Tract: Normal adrenal glands. Bilateral cortical renal scarring. No urinary calculi or hydronephrosis. Nondistended bladder about a Foley catheter. Locules of gas in the bladder. Stomach/Bowel: Enteric tube tip in the stomach. Stomach is within normal limits. Dilated loops of small bowel with air-fluid levels. There is tapering to a transition point in the right anterior abdomen. (Series 6/image 56). The small bowel downstream from this demonstrates mild wall thickening and hyperenhancement. Postoperative change of right hemicolectomy with ileal colonic anastomosis in the anterior mid abdomen. Normal caliber colon without wall thickening. Vascular/Lymphatic: Aortic atherosclerosis. No enlarged abdominal or pelvic lymph nodes. Reproductive: Hyperenhancing uterine fibroid.  No adnexal mass. Other: Trace stranding in the anterior peritoneum is likely postoperative. No organized fluid collection or abscess. No free intraperitoneal air. Musculoskeletal: No acute fracture. Edema in the left greater than right flanks. IMPRESSION: 1. Possible small-bowel obstruction with transition point in the right anterior abdomen. Primary differential consideration is enteritis with ileus as there is wall thickening and mucosal hyperenhancement of the small  bowel immediately downstream from the transition point. 2. Postoperative change of right hemicolectomy with ileal colonic anastomosis in the anterior mid abdomen. 3. Aortic Atherosclerosis (ICD10-I70.0). Electronically Signed   By: Rozell Cornet M.D.   On: 05/31/2023 01:07    Cardiac Studies   2d Echo 03/31/23 (prior to admission)   1. Left ventricular ejection fraction, by estimation, is 55 to 60%. The  left ventricle has normal function. The left ventricle has no regional  wall motion abnormalities. Left ventricular diastolic parameters were  normal. Elevated left atrial pressure.  The average left ventricular global longitudinal strain is -17.6 %. The  global longitudinal strain is abnormal.   2. Right ventricular systolic function is normal. The right ventricular  size is normal. Tricuspid regurgitation signal is inadequate for assessing  PA pressure.   3. The mitral valve is normal in structure. Trivial mitral valve  regurgitation. No evidence of mitral stenosis.   4. The aortic valve is tricuspid. Aortic valve regurgitation is not  visualized. No aortic stenosis is present.   5. The inferior vena cava is normal in size with greater than 50%  respiratory variability, suggesting right atrial pressure of 3 mmHg.   Patient Profile     68 y.o. female with CAD  with STEMI 1998 w/ VF arrest & anoxic brain injury w/ status epilepticus, s/p blow-by w/ BMS LAD 1 mo later. ISR>> CABG 1998 w/ LIMA-LAD & SVG-D1, AMI w/ emergent re-operation w/ SVG-LAD 1998, PTCA/DES to mSVG-LAD 01/2020, prior ICM with normalized LVEF by echo 03/2023, PAF, pre-DM, HLD, hypothyroidism, COPD, cecal adenocarcinoma admitted for elective right hemicolectomy. Post-op course notable for acute renal insufficiency, post-operative ileus/sluggish transit requiring NPO status, hypokalemia, hyponatremia, leukocytosis  Assessment & Plan    1. S/p R hemicolectomy for cecal adenocarcinoma with ileus - per gen surg team, continue  to avoid oral medications due to concern for absorption through NGT  2. PAF - per notes, avoiding amio due to interruption in anticoagulation this admission, now back on IV heparin  - HR 80s-120s on IV diltiazem  15mg /hr, unable yet to transition to oral form - pt states they did not walk her last night due to elevated HR - I suspect her HR will go up with any form of activity. Will review med plan with MD to determine if any additional med titration needed (IV metoprolol  at low dose may be an option) - TSH OK, FT4 minimally up, can recheck as OP  3. CAD - not on antiplatelets PTA due to OAC per Dr. Jill Motes note - no angina reported  - on PCSK9i as OP  Remainder per primary team   For questions or updates, please contact [facility] Please consult [email or web address] for contact info under Cardiology/STEMI.  Signed, Amora Sheehy N Raeden Schippers, PA-C 05/31/2023, 9:41 AM

## 2023-05-31 NOTE — Progress Notes (Signed)
 Peripherally Inserted Central Catheter Placement  The IV Nurse has discussed with the patient and/or persons authorized to consent for the patient, the purpose of this procedure and the potential benefits and risks involved with this procedure.  The benefits include less needle sticks, lab draws from the catheter, and the patient may be discharged home with the catheter. Risks include, but not limited to, infection, bleeding, blood clot (thrombus formation), and puncture of an artery; nerve damage and irregular heartbeat and possibility to perform a PICC exchange if needed/ordered by physician.  Alternatives to this procedure were also discussed.  Bard Power PICC patient education guide, fact sheet on infection prevention and patient information card has been provided to patient /or left at bedside.    PICC Placement Documentation  PICC Double Lumen 05/31/23 Right Brachial 36 cm 0 cm (Active)  Indication for Insertion or Continuance of Line Administration of hyperosmolar/irritating solutions (i.e. TPN, Vancomycin, etc.) 05/31/23 1304  Exposed Catheter (cm) 0 cm 05/31/23 1304  Site Assessment Clean, Dry, Intact 05/31/23 1304  Lumen #1 Status Flushed;Saline locked;Blood return noted 05/31/23 1304  Lumen #2 Status Flushed;Saline locked;Blood return noted 05/31/23 1304  Dressing Type Transparent;Securing device 05/31/23 1304  Dressing Status Antimicrobial disc/dressing in place;Clean, Dry, Intact 05/31/23 1304  Line Care Connections checked and tightened 05/31/23 1304  Line Adjustment (NICU/IV Team Only) No 05/31/23 1304  Dressing Intervention New dressing;Adhesive placed at insertion site (IV team only) 05/31/23 1304  Dressing Change Due 06/07/23 05/31/23 1304       Dominique Barber 05/31/2023, 1:05 PM

## 2023-05-31 NOTE — Plan of Care (Signed)
  Problem: Education: Goal: Understanding of discharge needs will improve Outcome: Progressing Goal: Verbalization of understanding of the causes of altered bowel function will improve Outcome: Progressing   Problem: Activity: Goal: Ability to tolerate increased activity will improve Outcome: Progressing   Problem: Bowel/Gastric: Goal: Gastrointestinal status for postoperative course will improve Outcome: Progressing

## 2023-06-01 DIAGNOSIS — Z9049 Acquired absence of other specified parts of digestive tract: Secondary | ICD-10-CM | POA: Diagnosis not present

## 2023-06-01 DIAGNOSIS — I4891 Unspecified atrial fibrillation: Secondary | ICD-10-CM | POA: Diagnosis not present

## 2023-06-01 LAB — COMPREHENSIVE METABOLIC PANEL WITH GFR
ALT: 37 U/L (ref 0–44)
AST: 30 U/L (ref 15–41)
Albumin: 2.9 g/dL — ABNORMAL LOW (ref 3.5–5.0)
Alkaline Phosphatase: 56 U/L (ref 38–126)
Anion gap: 13 (ref 5–15)
BUN: 14 mg/dL (ref 8–23)
CO2: 25 mmol/L (ref 22–32)
Calcium: 8 mg/dL — ABNORMAL LOW (ref 8.9–10.3)
Chloride: 94 mmol/L — ABNORMAL LOW (ref 98–111)
Creatinine, Ser: 1 mg/dL (ref 0.44–1.00)
GFR, Estimated: >60 mL/min (ref >60)
Glucose, Bld: 661 mg/dL (ref 70–99)
Potassium: 4.9 mmol/L (ref 3.5–5.1)
Sodium: 132 mmol/L — ABNORMAL LOW (ref 135–145)
Total Bilirubin: 0.4 mg/dL (ref 0.0–1.2)
Total Protein: 5.5 g/dL — ABNORMAL LOW (ref 6.5–8.1)

## 2023-06-01 LAB — CBC
HCT: 28.7 % — ABNORMAL LOW (ref 36.0–46.0)
Hemoglobin: 8.6 g/dL — ABNORMAL LOW (ref 12.0–15.0)
MCH: 27.6 pg (ref 26.0–34.0)
MCHC: 30 g/dL (ref 30.0–36.0)
MCV: 92 fL (ref 80.0–100.0)
Platelets: 359 10*3/uL (ref 150–400)
RBC: 3.12 MIL/uL — ABNORMAL LOW (ref 3.87–5.11)
RDW: 18.9 % — ABNORMAL HIGH (ref 11.5–15.5)
WBC: 11.1 10*3/uL — ABNORMAL HIGH (ref 4.0–10.5)
nRBC: 0.3 % — ABNORMAL HIGH (ref 0.0–0.2)

## 2023-06-01 LAB — MAGNESIUM: Magnesium: 2.5 mg/dL — ABNORMAL HIGH (ref 1.7–2.4)

## 2023-06-01 LAB — GLUCOSE, CAPILLARY
Glucose-Capillary: 157 mg/dL — ABNORMAL HIGH (ref 70–99)
Glucose-Capillary: 159 mg/dL — ABNORMAL HIGH (ref 70–99)
Glucose-Capillary: 138 mg/dL — ABNORMAL HIGH (ref 70–99)

## 2023-06-01 LAB — HEPARIN LEVEL (UNFRACTIONATED)
Heparin Unfractionated: 0.16 [IU]/mL — ABNORMAL LOW (ref 0.30–0.70)
Heparin Unfractionated: 0.13 [IU]/mL — ABNORMAL LOW (ref 0.30–0.70)

## 2023-06-01 LAB — PHOSPHORUS: Phosphorus: 3.9 mg/dL (ref 2.5–4.6)

## 2023-06-01 MED ORDER — DIGOXIN 0.25 MG/ML IJ SOLN
0.2500 mg | Freq: Once | INTRAMUSCULAR | Status: AC
  Administered 2023-06-01: 0.25 mg via INTRAVENOUS
  Filled 2023-06-01: qty 2

## 2023-06-01 MED ORDER — TRAVASOL 10 % IV SOLN
INTRAVENOUS | Status: AC
  Filled 2023-06-01: qty 470.4

## 2023-06-01 NOTE — Progress Notes (Signed)
 PHARMACY - ANTICOAGULATION CONSULT NOTE  Pharmacy Consult for heparin   Indication: atrial fibrillation (PTA Xarelto  on hold)  Allergies  Allergen Reactions   Iodinated Contrast Media Hives and Dermatitis   Linzess [Linaclotide] Other (See Comments)    Caused horrible stomach pain and gas   Atorvastatin  Other (See Comments)    Gas pain in abdomen with no relief   Other Diarrhea and Other (See Comments)    IBS- Special diet   Milk-Related Compounds Diarrhea and Other (See Comments)    Has IBS   Rosuvastatin  Calcium  Other (See Comments)    Worsening GI upset   Simvastatin Other (See Comments)    Myalgia & GI upset   Eluxadoline Nausea And Vomiting and Other (See Comments)    Viberzi - stomach pain   Iohexol  Hives, Rash and Other (See Comments)   Marinol [Dronabinol] Palpitations and Other (See Comments)    "Made me feel high and almost passed out"   Red Dye #40 (Allura Red) Hives and Rash    Red contrast dye     Patient Measurements: Height: 5\' 2"  (157.5 cm) Weight: 73.9 kg (162 lb 14.7 oz) IBW/kg (Calculated) : 50.1 HEPARIN  DW (KG): 67.3  Vital Signs: Temp: 98 F (36.7 C) (05/28 2226) Temp Source: Oral (05/28 2226)  Labs: Recent Labs    05/30/23 0426 05/31/23 0504 05/31/23 0630 06/01/23 0315 06/01/23 0500  HGB 9.5* 9.4*  --  8.6*  --   HCT 32.1* 30.4*  --  28.7*  --   PLT 360 383  --  359  --   HEPARINUNFRC 0.48 0.84* 0.69  --  0.16*  CREATININE 1.05* 1.07*  --  1.00  --     Estimated Creatinine Clearance: 51.4 mL/min (by C-G formula based on SCr of 1 mg/dL).   Medical History: Past Medical History:  Diagnosis Date   ACS (acute coronary syndrome) (HCC) 01/06/2020   With progressive angina while troponin elevation--borderline-NON-STEMI   ANTERIOR STEMI of LAD (x2). 01/19/1996   Complicated by VF arrest, and anoxic brain injury with status epilepticus; POBA of LAD --> 6 months later, after 2 vessel CABG she had postop complication with occluded LIMA/second  anterior STEMI   CAD in native artery & Grafts 01/1996   a) 1/'98: Ant STEMI => LAD POBA; b) 2/'98: 95% prox LAD @ POBA site --> BMS PCI (3.0 mm x 25 mm); c) 7/98 (5 months later) => BMS ISR of LAD--> CABGx2 (LIMA-LAD, SVG-D1 -> Emergent redo w/ SVG-LAD b/c acute LIMA occlusion); d) 01/2020: ACS -> DES PCI of SVG-LAD (75%&85%), TO of SVG-D1.   COPD (chronic obstructive pulmonary disease) (HCC)    Dyspnea    GERD (gastroesophageal reflux disease)    Headache    Hiatal hernia    Hyperlipidemia    Hypertension    Hypothyroidism    On Synthroid    Iron deficiency anemia    Irritable bowel syndrome    Followed by Dr. Tami Falcon.   Ischemic cardiomyopathy 01/03/2020   ECHO (ACS): EF 45-50% (by Cath EF 40-45%) - Apical Inferior/Apical Anteroseptal & Apex akinetic, GR II DD.    PAF (paroxysmal atrial fibrillation) (HCC) 05/03/2014   New onset - originally with RVR   Pneumonia    x 2   PONV (postoperative nausea and vomiting)    very gag reflex   S/P CABG x 2; with EMERGENT Redo x 1. 07/1996   Initially LIMA-LAD, SVG-D1 --> immediate LIMA failure post CABG with anterior MI --> urgent redo SVG-LAD  beyond D2.; Widely patent grafts as of 2005 with left dominant system. Graft to the diagonal branch has a very small target vessel but the vein graft to LAD retrograde fills a large bifurcating D2 and antegrade fills a large D3.   Tobacco abuse     Medications:  - on Xarelto  20 mg daily PTA  Assessment: Patient is a 68 y.o with hx afib on Xarelto  PTA and was recently diagnosed with colon cancer who presented to [facility] on 05/25/23 for right hemicolectomy.  She underwent lap right hemicolectomy on 05/25/23 with heparin  drip started post-op on 05/28/23.  Today, 06/01/2023: - heparin  level is SUB-therapeutic at 0.16 on IV heparin  650 units/hr - Hgb trending down 8.6, plts WNL - no bleeding or infusion related concerns reported by RN  Goal of Therapy:  Heparin  level 0.3-0.7 units/ml Monitor platelets by  anticoagulation protocol: Yes   Plan:  - Increase heparin  drip to 750 units/hr - Recheck heparin  level 8h after rate increase - daily heparin  level and cbc - monitor for s/sx bleeding  Arie Kurtz PharmD 06/01/2023,6:05 AM

## 2023-06-01 NOTE — Progress Notes (Signed)
 PROGRESS NOTE  Dominique Barber  DOB: 10-Oct-1955  PCP: Abraham Abo, MD UJW:[id number]  DOA: 05/25/2023  LOS: 7 days  Hospital Day: 8  Brief narrative: Dominique Barber is a 68 y.o. female with PMH significant for HTN, HLD, CAD with h/o V-fib arrest and subsequent CABG, ischemic cardiomyopathy, paroxysmal A-fib, COPD, not on oxygen at home. 03/17/2023, patient underwent colonoscopy by Dr. Nickey Barn for iron deficiency anemia.  She was found to have tumor in the ascending colon, biopsy showed moderately differentiated invasive adenocarcinoma. She was seen by general surgery and as planned underwent elective right hemicolectomy on 5/22. Hospitalist service was consulted for postoperative medical management. 5/22, cardiology service was also consulted for A-fib with RVR  Subjective: Patient was seen and examined this morning.  Propped up in bed.  No return of bowel function yet.  Assessment and plan: Colon cancer S/p right hemicolectomy 5/22 Dr. Camilo Cella Post-operative ileus Currently remains in postoperative ileus.  General surgery following. NG tube attached to suction. No return of bowel function yet. Currently TPN Pain management with tramadol as needed and Dilaudid  as needed Monitor electrolytes Recent Labs  Lab 05/27/23 0553 05/28/23 0502 05/29/23 0458 05/30/23 0426 05/31/23 0504 06/01/23 0315  K 3.4* 5.1 4.6 3.5 4.1 4.9  MG 2.1 2.3 2.5*  --  2.2 2.5*  PHOS  --  2.7  --   --  2.9 3.9   Hyponatremia Mild likely because of GI fluid loss and no oral intake. Currently on LR.  Continue to monitor Recent Labs  Lab 05/26/23 0436 05/27/23 0553 05/28/23 0502 05/29/23 0458 05/30/23 0426 05/31/23 0504 06/01/23 0315  NA 135 130* 131* 131* 129* 131* 132*   A-fib with RVR  H/o paroxysmal A-fib 5/25, patient went to RVR.  Cardizem  drip was started Cardiology was consulted Remained on Cardizem  drip.  Also on digoxin For anticoag, currently on heparin  drip.  Plan to switch  to Xarelto  when oral intake is insured  CAD/CABG Currently does not have any anginal symptoms.   PTA meds-Xarelto , PCSK9i as an outpatient Xarelto  plan as above  Hyperglycemia A1c 6.3 on 05/27/2023 Sugar level may rise up with TPN. Continue SSI/Accu-Cheks Recent Labs  Lab 05/31/23 1123 05/31/23 1606 05/31/23 2357 06/01/23 0552 06/01/23 1155  GLUCAP 133* 125* 153* 157* 159*    Hypothyroidism To resume levothyroxine  when able to take p.o.  Acute urinary retention Foley catheter in place. Per nursing staff, patient failed voiding trial few days ago.  I hope once the bowel function returns, voiding trial would have more likelihood of success.    Mobility: Encourage ambulation.  Uses a walker  Goals of care   Code Status: Full Code     DVT prophylaxis:  SCD's Start: 05/25/23 1501 Place TED hose Start: 05/25/23 1501   Antimicrobials: None Fluid: Currently LR at 75 mL/h.  Noted plan of TPN Family Communication: None at bedside  Status: Inpatient Level of care:  Progressive   Patient is from: Home Needs to continue in-hospital care: Remains in postop ileus Anticipated d/c to: Hopefully home after clinical improvement   Diet:  Diet Order             Diet NPO time specified Except for: Ice Chips, Sips with Meds  Diet effective now                   Scheduled Meds:  Chlorhexidine Gluconate Cloth  6 each Topical Daily   insulin aspart  0-9 Units Subcutaneous Q6H   mouth  rinse  15 mL Mouth Rinse 4 times per day   thiamine (VITAMIN B1) injection  100 mg Intravenous Daily    PRN meds: albuterol , diphenhydrAMINE  **OR** diphenhydrAMINE , hydrALAZINE , HYDROmorphone  (DILAUDID ) injection, lidocaine , lip balm, methocarbamol  (ROBAXIN ) injection, metoprolol  tartrate, nitroGLYCERIN , ondansetron  **OR** ondansetron  (ZOFRAN ) IV, mouth rinse, prochlorperazine, simethicone , sodium chloride  flush, traMADol   Infusions:   diltiazem  (CARDIZEM ) infusion 12.5 mg/hr (06/01/23  1155)   heparin  750 Units/hr (06/01/23 0615)   lactated ringers  35 mL/hr at 05/31/23 1839   TPN ADULT (ION) 40 mL/hr at 05/31/23 1740   TPN ADULT (ION)      Antimicrobials: Anti-infectives (From admission, onward)    Start     Dose/Rate Route Frequency Ordered Stop   05/25/23 1400  neomycin (MYCIFRADIN) tablet 1,000 mg  Status:  Discontinued       Placed in "And" Linked Group   1,000 mg Oral 3 times per day 05/25/23 0835 05/25/23 0840   05/25/23 1400  metroNIDAZOLE (FLAGYL) tablet 1,000 mg  Status:  Discontinued       Placed in "And" Linked Group   1,000 mg Oral 3 times per day 05/25/23 0835 05/25/23 0840   05/25/23 0845  cefoTEtan (CEFOTAN) 2 g in sodium chloride  0.9 % 100 mL IVPB        2 g 200 mL/hr over 30 Minutes Intravenous On call to O.R. 05/25/23 0835 05/26/23 0809       Objective: Vitals:   06/01/23 1300 06/01/23 1400  BP: 139/82 (!) 114/97  Pulse: 74 70  Resp: 19 19  Temp:    SpO2: 92% 95%    Intake/Output Summary (Last 24 hours) at 06/01/2023 1626 Last data filed at 06/01/2023 0648 Gross per 24 hour  Intake 1461.72 ml  Output 780 ml  Net 681.72 ml   Filed Weights   05/30/23 0346 05/31/23 0537 06/01/23 0500  Weight: 74.2 kg 73.9 kg 73.7 kg   Weight change: -0.2 kg Body mass index is 29.72 kg/m.   Physical Exam: General exam: Pleasant, elderly Caucasian female.  Not in physical distress Skin: No rashes, lesions or ulcers. HEENT: Atraumatic, normocephalic, no obvious bleeding.  Has NG tube attached to suction Lungs: Diminished entry in both bases.  Otherwise clear to auscultation bilaterally CVS: S1, S2, no murmur,   GI/Abd: Soft, postop tenderness present, nondistended, bowel sound sluggish,   CNS: Alert, awake, oriented x 3 Psychiatry: Mood appropriate,  Extremities: No pedal edema, no calf tenderness,   Data Review: I have personally reviewed the laboratory data and studies available.  F/u labs ordered Unresulted Labs (From admission, onward)      Start     Ordered   06/05/23 0500  Triglycerides  (Standard TPN Labs (all labs to be drawn at 0500))  Every Monday (0500),   R     Question:  Specimen collection method  Answer:  Lab=Lab collect   05/31/23 0647   06/02/23 0500  Comprehensive metabolic panel  Daily,   R     Question:  Specimen collection method  Answer:  Lab=Lab collect   05/31/23 0817   06/02/23 0500  Magnesium  Daily,   R     Question:  Specimen collection method  Answer:  Lab=Lab collect   05/31/23 0817   06/02/23 0500  Phosphorus  Daily,   R     Question:  Specimen collection method  Answer:  Lab=Lab collect   05/31/23 0817   06/01/23 1400  Heparin  level (unfractionated)  Once-Timed,   TIMED  Question:  Specimen collection method  Answer:  IV Team=IV Team collect   06/01/23 0616   06/01/23 0500  Comprehensive metabolic panel  (Standard TPN Labs (all labs to be drawn at 0500))  Every Mon,Thu (0500),   R     Question:  Specimen collection method  Answer:  Lab=Lab collect   05/31/23 0647   06/01/23 0500  Magnesium  (Standard TPN Labs (all labs to be drawn at 0500))  Every Mon,Thu (0500),   R     Question:  Specimen collection method  Answer:  Lab=Lab collect   05/31/23 0647   06/01/23 0500  Phosphorus  (Standard TPN Labs (all labs to be drawn at 0500))  Every Mon,Thu (0500),   R     Question:  Specimen collection method  Answer:  Lab=Lab collect   05/31/23 0647   05/29/23 0500  CBC  Daily,   R      05/28/23 1104   05/29/23 0500  Heparin  level (unfractionated)  Daily,   R      05/28/23 1930            Signed, Hoyt Macleod, MD [facility] 06/01/2023

## 2023-06-01 NOTE — Progress Notes (Signed)
 Physical Therapy Treatment Patient Details Name: Dominique Barber MRN: [id number] DOB: 06-11-1955 Today's Date: 06/01/2023   History of Present Illness 68 yo female S/P laparoscopic right COLECTOMY and LYSIS of ADHESIONS on 5/22. PMH:CAD, CABG,PAF,ACS,COPD,IBS, GERD, Hypothyroid, HH, HTN.    PT Comments  Pt very pleasant and motivated.  Pt at least CGA today due to multiple lines/leads.  Pt ambulated in hallway however distance was limited by therapist due to increase in HR and c/o moderate dyspnea on exertion(135-140 bpm and RN aware).  Pt with progressive monitoring unit and also receiving IV cardizem .    If plan is discharge home, recommend the following: A little help with bathing/dressing/bathroom;Assistance with cooking/housework;Assist for transportation;Help with stairs or ramp for entrance   Can travel by private vehicle        Equipment Recommendations  None recommended by PT    Recommendations for Other Services       Precautions / Restrictions Precautions Precautions: Fall Precaution/Restrictions Comments: abdomen, NG tube, monitor HR     Mobility  Bed Mobility Overal bed mobility: Needs Assistance Bed Mobility: Supine to Sit     Supine to sit: Contact guard     General bed mobility comments: pt performs supine to sit, declines need to log roll, CGA for multimodal lines    Transfers Overall transfer level: Needs assistance Equipment used: Rolling walker (2 wheels) Transfers: Sit to/from Stand Sit to Stand: Contact guard assist           General transfer comment: verbal cues for hand placement    Ambulation/Gait Ambulation/Gait assistance: Contact guard assist Gait Distance (Feet): 180 Feet Assistive device: Rolling walker (2 wheels) Gait Pattern/deviations: Step-through pattern, Decreased stride length       General Gait Details: verbal cues for RW positioning; HR elevated to 135-140 bpm (RN notified) so distance limited by  therapist   Stairs             Wheelchair Mobility     Tilt Bed    Modified Rankin (Stroke Patients Only)       Balance                                            Communication Communication Communication: No apparent difficulties  Cognition Arousal: Alert Behavior During Therapy: WFL for tasks assessed/performed   PT - Cognitive impairments: No apparent impairments                       PT - Cognition Comments: pleasant and motivated Following commands: Intact      Cueing Cueing Techniques: Verbal cues  Exercises      General Comments        Pertinent Vitals/Pain Pain Assessment Pain Assessment: Faces Faces Pain Scale: Hurts even more Pain Location: right lower abdomen Pain Descriptors / Indicators: Sharp, Sore, Grimacing, Guarding Pain Intervention(s): Monitored during session, Repositioned    Home Living                          Prior Function            PT Goals (current goals can now be found in the care plan section) Progress towards PT goals: Progressing toward goals    Frequency    Min 2X/week      PT Plan      Co-evaluation  AM-PAC PT "6 Clicks" Mobility   Outcome Measure  Help needed turning from your back to your side while in a flat bed without using bedrails?: None Help needed moving from lying on your back to sitting on the side of a flat bed without using bedrails?: A Little Help needed moving to and from a bed to a chair (including a wheelchair)?: A Little Help needed standing up from a chair using your arms (e.g., wheelchair or bedside chair)?: A Little Help needed to walk in hospital room?: A Little Help needed climbing 3-5 steps with a railing? : A Little 6 Click Score: 19    End of Session Equipment Utilized During Treatment: Gait belt Activity Tolerance: Patient tolerated treatment well Patient left: in chair;with call bell/phone within reach;with chair  alarm set Nurse Communication: Mobility status PT Visit Diagnosis: Difficulty in walking, not elsewhere classified (R26.2)     Time: 7846-9629 PT Time Calculation (min) (ACUTE ONLY): 20 min  Charges:    $Gait Training: 8-22 mins PT General Charges $$ ACUTE PT VISIT: 1 Visit                     Blanch Bunde, DPT Physical Therapist Acute Rehabilitation Services Office: [phone]    Myna Asal Payson 06/01/2023, 12:38 PM

## 2023-06-01 NOTE — Progress Notes (Signed)
 7 Days Post-Op   Subjective/Chief Complaint: Some flatus, no BM.  Denies any abdominal pain.  Objective: Vital signs in last 24 hours: Temp:  [98 F (36.7 C)-98.4 F (36.9 C)] 98.4 F (36.9 C) (05/29 0612) Pulse Rate:  [85-98] 85 (05/29 0612) Resp:  [17-19] 19 (05/28 1200) BP: (107-138)/(65-81) 107/65 (05/29 0612) SpO2:  [93 %-99 %] 93 % (05/29 0612) Last BM Date : 05/26/23  Intake/Output from previous day: 05/28 0701 - 05/29 0700 In: 1461.7 [I.V.:1461.7] Out: 480 [Urine:480] Intake/Output this shift: Total I/O In: 1461.7 [I.V.:1461.7] Out: 480 [Urine:480]  General appearance: alert and cooperative Resp: clear to auscultation bilaterally Cardio: NSR  Incision/Wound:mild to moderate distention; incision c/d/I; not significantly tender  Lab Results:  Recent Labs    05/31/23 0504 06/01/23 0315  WBC 9.8 11.1*  HGB 9.4* 8.6*  HCT 30.4* 28.7*  PLT 383 359   BMET Recent Labs    05/31/23 0504 06/01/23 0315  NA 131* 132*  K 4.1 4.9  CL 98 94*  CO2 17* 25  GLUCOSE 145* 661*  BUN 12 14  CREATININE 1.07* 1.00  CALCIUM  8.4* 8.0*   PT/INR No results for input(s): "LABPROT", "INR" in the last 72 hours. ABG No results for input(s): "PHART", "HCO3" in the last 72 hours.  Invalid input(s): "PCO2", "PO2"  Studies/Results: US  EKG SITE RITE Result Date: 05/31/2023 If Site Rite image not attached, placement could not be confirmed due to current cardiac rhythm.  CT ABDOMEN PELVIS W CONTRAST Result Date: 05/31/2023 CLINICAL DATA:  Bowel obstruction suspected, nausea, vomiting, abdominal pain, status post colectomy for tumor removal and lysis of adhesions on May 22nd. EXAM: CT ABDOMEN AND PELVIS WITH CONTRAST TECHNIQUE: Multidetector CT imaging of the abdomen and pelvis was performed using the standard protocol following bolus administration of intravenous contrast. RADIATION DOSE REDUCTION: This exam was performed according to the departmental dose-optimization program  which includes automated exposure control, adjustment of the mA and/or kV according to patient size and/or use of iterative reconstruction technique. CONTRAST:  OMNIPAQUE  IOHEXOL  300 MG/ML  SOLN COMPARISON:  Abdominal radiographs 05/29/2023; MRI abdomen 04/13/2023; CT abdomen pelvis 03/29/2023 FINDINGS: Lower chest: No acute abnormality. Hepatobiliary: Scattered hypoattenuating lesions too small to definitively characterize. Cholecystectomy. No biliary dilation. Pancreas: Fatty atrophy.  No acute abnormality. Spleen: Unremarkable. Adrenals/Urinary Tract: Normal adrenal glands. Bilateral cortical renal scarring. No urinary calculi or hydronephrosis. Nondistended bladder about a Foley catheter. Locules of gas in the bladder. Stomach/Bowel: Enteric tube tip in the stomach. Stomach is within normal limits. Dilated loops of small bowel with air-fluid levels. There is tapering to a transition point in the right anterior abdomen. (Series 6/image 56). The small bowel downstream from this demonstrates mild wall thickening and hyperenhancement. Postoperative change of right hemicolectomy with ileal colonic anastomosis in the anterior mid abdomen. Normal caliber colon without wall thickening. Vascular/Lymphatic: Aortic atherosclerosis. No enlarged abdominal or pelvic lymph nodes. Reproductive: Hyperenhancing uterine fibroid.  No adnexal mass. Other: Trace stranding in the anterior peritoneum is likely postoperative. No organized fluid collection or abscess. No free intraperitoneal air. Musculoskeletal: No acute fracture. Edema in the left greater than right flanks. IMPRESSION: 1. Possible small-bowel obstruction with transition point in the right anterior abdomen. Primary differential consideration is enteritis with ileus as there is wall thickening and mucosal hyperenhancement of the small bowel immediately downstream from the transition point. 2. Postoperative change of right hemicolectomy with ileal colonic  anastomosis in the anterior mid abdomen. 3. Aortic Atherosclerosis (ICD10-I70.0). Electronically Signed   By:  Rozell Cornet M.D.   On: 05/31/2023 01:07    Anti-infectives: Anti-infectives (From admission, onward)    Start     Dose/Rate Route Frequency Ordered Stop   05/25/23 1400  neomycin  (MYCIFRADIN ) tablet 1,000 mg  Status:  Discontinued       Placed in "And" Linked Group   1,000 mg Oral 3 times per day 05/25/23 0835 05/25/23 0840   05/25/23 1400  metroNIDAZOLE  (FLAGYL ) tablet 1,000 mg  Status:  Discontinued       Placed in "And" Linked Group   1,000 mg Oral 3 times per day 05/25/23 0835 05/25/23 0840   05/25/23 0845  cefoTEtan  (CEFOTAN ) 2 g in sodium chloride  0.9 % 100 mL IVPB        2 g 200 mL/hr over 30 Minutes Intravenous On call to O.R. 05/25/23 1914 05/26/23 0809       Assessment/Plan: s/p Procedure(s) with comments: COLECTOMY, RIGHT, LAPAROSCOPIC (Right) - LAPAROSCOPIC RIGHT HEMICOLECTOMY LYSIS, ADHESIONS, LAPAROSCOPIC (N/A)  ILEUS NOTED - continue NGT; will plan to start TPN today; PICC line being requested.  All of this was discussed with her and rationale therein.  She expressed understanding and agreement.  NPO, MIVF, TPN A fib- rate better controlled   CT 5/28 - ileus favored  All the above has been reviewed with her.  Hopefully over the coming days, things begin to improve and open up.  In the meantime, plan to continue the NG tube.  IV based medications for critical medications that she is unlikely able to absorb much enterally with an NG.  From an anticoagulation perspective, she is on a heparin  drip.  Noted plans for Eliquis once tolerating oral.  Appreciate medicine / cardiology assistance with this pt    LOS: 7 days    Melvenia Stabs  MD  06/01/2023

## 2023-06-01 NOTE — Progress Notes (Signed)
 Cardiology:  Dominique Barber  Subjective:  Denies SSCP, palpitations or Dyspnea NG tube in place  Rates better controlled on telemetry   Objective:  Vitals:   05/31/23 2226 06/01/23 0500 06/01/23 0612 06/01/23 0800  BP:   107/65 106/64  Pulse:   85   Resp:    15  Temp: 98 F (36.7 C)  98.4 F (36.9 C)   TempSrc: Oral  Oral   SpO2:   93%   Weight:  73.7 kg    Height:        Intake/Output from previous day:  Intake/Output Summary (Last 24 hours) at 06/01/2023 0851 Last data filed at 06/01/2023 0648 Gross per 24 hour  Intake 1461.72 ml  Output 780 ml  Net 681.72 ml    Physical Exam: GEN: No acute distress.  HEENT: Normocephalic, atraumatic, sclera non-icteric. Neck: No JVD or bruits. Cardiac: irregularly irregular, borderline ULN heart rate, no murmurs, rubs, or gallops.  Respiratory: Clear to auscultation bilaterally. Breathing is unlabored. GI: Soft, nontender, NGT in place  MS: no deformity. Extremities: No clubbing or cyanosis. No edema. Distal pedal pulses are 2+ and equal bilaterally. Neuro:  AAOx3. Follows commands. Psych:  Responds to questions appropriately with a normal affec  Lab Results: Basic Metabolic Panel: Recent Labs    05/31/23 0504 06/01/23 0315  NA 131* 132*  K 4.1 4.9  CL 98 94*  CO2 17* 25  GLUCOSE 145* 661*  BUN 12 14  CREATININE 1.07* 1.00  CALCIUM  8.4* 8.0*  MG 2.2 2.5*  PHOS 2.9 3.9   Liver Function Tests: Recent Labs    06/01/23 0315  AST 30  ALT 37  ALKPHOS 56  BILITOT 0.4  PROT 5.5*  ALBUMIN 2.9*   No results for input(s): "LIPASE", "AMYLASE" in the last 72 hours. CBC: Recent Labs    05/29/23 1106 05/30/23 0426 05/31/23 0504 06/01/23 0315  WBC 9.5   < > 9.8 11.1*  NEUTROABS 7.1  --   --   --   HGB 9.8*   < > 9.4* 8.6*  HCT 32.7*   < > 30.4* 28.7*  MCV 89.6   < > 88.4 92.0  PLT 349   < > 383 359   < > = values in this interval not displayed.    Thyroid  Function Tests: Recent Labs    05/31/23 0504  TSH  1.078   Anemia Panel: No results for input(s): "VITAMINB12", "FOLATE", "FERRITIN", "TIBC", "IRON", "RETICCTPCT" in the last 72 hours.  Imaging: US  EKG SITE RITE Result Date: 05/31/2023 If Site Rite image not attached, placement could not be confirmed due to current cardiac rhythm.  CT ABDOMEN PELVIS W CONTRAST Result Date: 05/31/2023 CLINICAL DATA:  Bowel obstruction suspected, nausea, vomiting, abdominal pain, status post colectomy for tumor removal and lysis of adhesions on May 22nd. EXAM: CT ABDOMEN AND PELVIS WITH CONTRAST TECHNIQUE: Multidetector CT imaging of the abdomen and pelvis was performed using the standard protocol following bolus administration of intravenous contrast. RADIATION DOSE REDUCTION: This exam was performed according to the departmental dose-optimization program which includes automated exposure control, adjustment of the mA and/or kV according to patient size and/or use of iterative reconstruction technique. CONTRAST:  OMNIPAQUE  IOHEXOL  300 MG/ML  SOLN COMPARISON:  Abdominal radiographs 05/29/2023; MRI abdomen 04/13/2023; CT abdomen pelvis 03/29/2023 FINDINGS: Lower chest: No acute abnormality. Hepatobiliary: Scattered hypoattenuating lesions too small to definitively characterize. Cholecystectomy. No biliary dilation. Pancreas: Fatty atrophy.  No acute abnormality. Spleen: Unremarkable. Adrenals/Urinary Tract: Normal adrenal glands.  Bilateral cortical renal scarring. No urinary calculi or hydronephrosis. Nondistended bladder about a Foley catheter. Locules of gas in the bladder. Stomach/Bowel: Enteric tube tip in the stomach. Stomach is within normal limits. Dilated loops of small bowel with air-fluid levels. There is tapering to a transition point in the right anterior abdomen. (Series 6/image 56). The small bowel downstream from this demonstrates mild wall thickening and hyperenhancement. Postoperative change of right hemicolectomy with ileal colonic anastomosis in the  anterior mid abdomen. Normal caliber colon without wall thickening. Vascular/Lymphatic: Aortic atherosclerosis. No enlarged abdominal or pelvic lymph nodes. Reproductive: Hyperenhancing uterine fibroid.  No adnexal mass. Other: Trace stranding in the anterior peritoneum is likely postoperative. No organized fluid collection or abscess. No free intraperitoneal air. Musculoskeletal: No acute fracture. Edema in the left greater than right flanks. IMPRESSION: 1. Possible small-bowel obstruction with transition point in the right anterior abdomen. Primary differential consideration is enteritis with ileus as there is wall thickening and mucosal hyperenhancement of the small bowel immediately downstream from the transition point. 2. Postoperative change of right hemicolectomy with ileal colonic anastomosis in the anterior mid abdomen. 3. Aortic Atherosclerosis (ICD10-I70.0). Electronically Signed   By: Rozell Cornet M.D.   On: 05/31/2023 01:07    Cardiac Studies:  ECG: afib rate 125 nonspecific ST changes    Telemetry: afib rates 70-80 bpm  Echo: EF 55-60%   Medications:    Chlorhexidine Gluconate Cloth  6 each Topical Daily   digoxin  0.25 mg Intravenous Once   insulin aspart  0-9 Units Subcutaneous Q6H   mouth rinse  15 mL Mouth Rinse 4 times per day   thiamine (VITAMIN B1) injection  100 mg Intravenous Daily      diltiazem  (CARDIZEM ) infusion 15 mg/hr (06/01/23 0012)   heparin  750 Units/hr (06/01/23 0615)   lactated ringers  35 mL/hr at 05/31/23 1839   TPN ADULT (ION) 40 mL/hr at 05/31/23 1740   TPN ADULT (ION)      Assessment/Plan:  69 y.o. female with CAD with STEMI 1998 w/ VF arrest & anoxic brain injury w/ status epilepticus, s/p blow-by w/ BMS LAD 1 mo later. ISR>> CABG 1998 w/ LIMA-LAD & SVG-D1, AMI w/ emergent re-operation w/ SVG-LAD 1998, PTCA/DES to mSVG-LAD 01/2020, prior ICM with normalized LVEF by echo 03/2023, PAF, pre-DM, HLD, hypothyroidism, COPD, cecal adenocarcinoma admitted  for elective right hemicolectomy. Post-op course notable for acute renal insufficiency, post-operative ileus/sluggish transit requiring NPO status, hypokalemia, hyponatremia, leukocytosis   PAF:  IV heparin  Rates better controlled with iv cardizem  and digoxin will give another dose of digoxin today PRN lopressor  for HR > 100 bpm Resume oral Toprol  and xarelto  when able to take PO Colectomy:  TPN/NG tube per surgery   Janelle Mediate 06/01/2023, 8:51 AM

## 2023-06-01 NOTE — Progress Notes (Signed)
 PHARMACY - ANTICOAGULATION CONSULT NOTE  Pharmacy Consult for heparin   Indication: atrial fibrillation (PTA Xarelto  on hold)  Allergies  Allergen Reactions   Iodinated Contrast Media Hives and Dermatitis   Linzess [Linaclotide] Other (See Comments)    Caused horrible stomach pain and gas   Atorvastatin  Other (See Comments)    Gas pain in abdomen with no relief   Other Diarrhea and Other (See Comments)    IBS- Special diet   Milk-Related Compounds Diarrhea and Other (See Comments)    Has IBS   Rosuvastatin  Calcium  Other (See Comments)    Worsening GI upset   Simvastatin Other (See Comments)    Myalgia & GI upset   Eluxadoline Nausea And Vomiting and Other (See Comments)    Viberzi - stomach pain   Iohexol  Hives, Rash and Other (See Comments)   Marinol [Dronabinol] Palpitations and Other (See Comments)    "Made me feel high and almost passed out"   Red Dye #40 (Allura Red) Hives and Rash    Red contrast dye     Patient Measurements: Height: 5\' 2"  (157.5 cm) Weight: 73.7 kg (162 lb 7.7 oz) IBW/kg (Calculated) : 50.1 HEPARIN  DW (KG): 67.3  Vital Signs: Temp: 97.5 F (36.4 C) (05/29 1226) Temp Source: Oral (05/29 1226) BP: 125/78 (05/29 1700) Pulse Rate: 85 (05/29 1700)  Labs: Recent Labs    05/30/23 0426 05/31/23 0504 05/31/23 0630 06/01/23 0315 06/01/23 0500 06/01/23 1640  HGB 9.5* 9.4*  --  8.6*  --   --   HCT 32.1* 30.4*  --  28.7*  --   --   PLT 360 383  --  359  --   --   HEPARINUNFRC 0.48 0.84* 0.69  --  0.16* 0.13*  CREATININE 1.05* 1.07*  --  1.00  --   --     Estimated Creatinine Clearance: 51.3 mL/min (by C-G formula based on SCr of 1 mg/dL).   Medical History: Past Medical History:  Diagnosis Date   ACS (acute coronary syndrome) (HCC) 01/06/2020   With progressive angina while troponin elevation--borderline-NON-STEMI   ANTERIOR STEMI of LAD (x2). 01/19/1996   Complicated by VF arrest, and anoxic brain injury with status epilepticus; POBA of  LAD --> 6 months later, after 2 vessel CABG she had postop complication with occluded LIMA/second anterior STEMI   CAD in native artery & Grafts 01/1996   a) 1/'98: Ant STEMI => LAD POBA; b) 2/'98: 95% prox LAD @ POBA site --> BMS PCI (3.0 mm x 25 mm); c) 7/98 (5 months later) => BMS ISR of LAD--> CABGx2 (LIMA-LAD, SVG-D1 -> Emergent redo w/ SVG-LAD b/c acute LIMA occlusion); d) 01/2020: ACS -> DES PCI of SVG-LAD (75%&85%), TO of SVG-D1.   COPD (chronic obstructive pulmonary disease) (HCC)    Dyspnea    GERD (gastroesophageal reflux disease)    Headache    Hiatal hernia    Hyperlipidemia    Hypertension    Hypothyroidism    On Synthroid    Iron deficiency anemia    Irritable bowel syndrome    Followed by Dr. Tami Falcon.   Ischemic cardiomyopathy 01/03/2020   ECHO (ACS): EF 45-50% (by Cath EF 40-45%) - Apical Inferior/Apical Anteroseptal & Apex akinetic, GR II DD.    PAF (paroxysmal atrial fibrillation) (HCC) 05/03/2014   New onset - originally with RVR   Pneumonia    x 2   PONV (postoperative nausea and vomiting)    very gag reflex   S/P CABG x 2;  with EMERGENT Redo x 1. 07/1996   Initially LIMA-LAD, SVG-D1 --> immediate LIMA failure post CABG with anterior MI --> urgent redo SVG-LAD beyond D2.; Widely patent grafts as of 2005 with left dominant system. Graft to the diagonal branch has a very small target vessel but the vein graft to LAD retrograde fills a large bifurcating D2 and antegrade fills a large D3.   Tobacco abuse     Medications:  - on Xarelto  20 mg daily PTA  Assessment: Patient is a 68 y.o with hx afib on Xarelto  PTA and was recently diagnosed with colon cancer who presented to [facility] on 05/25/23 for right hemicolectomy.  She underwent lap right hemicolectomy on 05/25/23 with heparin  drip started post-op on 05/28/23.  Today, 06/01/2023: - Heparin  level is SUB-therapeutic at 0.13 on IV heparin  750 units/hr (continues to trend down) - Hgb trending down 8.6, plts WNL - No  bleeding or infusion related concerns reported by RN  Goal of Therapy:  Heparin  level 0.3-0.7 units/ml Monitor platelets by anticoagulation protocol: Yes   Plan:  - Increase heparin  infusion to 950 units/hr - Recheck heparin  level 8h after rate increase - Daily heparin  level and cbc - Monitor for s/sx bleeding  Lolita Rise, PharmD, BCPS Clinical Pharmacist 06/01/2023 6:17 PM

## 2023-06-01 NOTE — Plan of Care (Signed)
  Problem: Activity: Goal: Ability to tolerate increased activity will improve Outcome: Progressing   Problem: Skin Integrity: Goal: Will show signs of wound healing Outcome: Progressing   Problem: Education: Goal: Knowledge of General Education information will improve Description: Including pain rating scale, medication(s)/side effects and non-pharmacologic comfort measures Outcome: Progressing   Problem: Pain Managment: Goal: General experience of comfort will improve and/or be controlled Outcome: Progressing   Problem: Skin Integrity: Goal: Risk for impaired skin integrity will decrease Outcome: Progressing   Problem: Education: Goal: Ability to describe self-care measures that may prevent or decrease complications (Diabetes Survival Skills Education) will improve Outcome: Progressing

## 2023-06-01 NOTE — Progress Notes (Signed)
 PHARMACY - TOTAL PARENTERAL NUTRITION CONSULT NOTE   Indication: Prolonged ileus  Patient Measurements: Height: 5\' 2"  (157.5 cm) Weight: 73.7 kg (162 lb 7.7 oz) IBW/kg (Calculated) : 50.1 TPN AdjBW (KG): 57.1 Body mass index is 29.72 kg/m. Usual Weight:   Assessment: Patient is a 68 y.o F with a recent diagnosis of colon cancer who presented to [facility] on 05/25/23 for right hemicolectomy. Pharmacy has been consulted on 05/31/23 to start TPN for ileus.  Glucose / Insulin: A1c 6.3; no hx DM - CBGs (goal <150): 125-157; 03:16 glucose 661 appears to be inaccurate when compared to am fingerstick Electrolytes: Na low 132, K up to 4.9 from 4.1 yesterday - other lytes WNL including CoCa at 9.2 - goal for ileus: Mag >2, K >4  Renal: scr 1.00 (crcl~51), BUN wnl Hepatic: Alk phos WNL, AST/ALT WNL, Total Protein elevated at 5.5, albumin low at 2.9, Total Bilirubin WNL at 0.4 Intake / Output; MIVF: LR @35  ml/hr - I/O: +681.7 mL in 24 hrs - UOP: 480 mL - NG/GT: 300 mL - last BM: 05/27/23 GI Imaging: - 5/24 and Xray: Mildly dilated loops of small bowel.This is nonspecific and could represent ileus or partial small bowel obstruction. - 5/26 abd Xray: Persistent small bowel dilatation  - 5/28 abd CT: Possible small-bowel obstruction with transition point in the right anterior abdomen. Primary differential consideration is enteritis with ileus as there is wall thickening and mucosal hyperenhancement of the small bowel immediately downstream from the transition point. GI Surgeries / Procedures:  - 5/22: Laparoscopic right hemicolectomy   Central access: pending PICC placement  TPN start date: 5/28- pending PICC placement   Nutritional Goals: Goal TPN rate is 80 mL/hr (provides 94 g of protein and 1959 kcals per day)  RD Assessment:  Estimated Needs Total Energy Estimated Needs: 1800-2000 Total Protein Estimated Needs: 90-100 grams Total Fluid Estimated Needs: >1.8 L/day  Current Nutrition:   NPO  - has NGT  Plan:  Continue TPN at 40 mL/hr at 1800 (did not increase rate today due to patient at risk for refeeding syndrome) Electrolytes in TPN:  Na 154 mEq/L - increased K 20 mEq/L - decreased Ca 5 mEq/L Mg 3 mEq/L - decreased Phos 15 mmol/L Cl:Ac 1:2 Add standard MVI and trace elements to TPN Continue SSI sensitive q6h and adjust as needed  Thiamine 100 mg daily x5 days (5/28 - 6/1) Continue MIVF at 35 mL/hr Monitor TPN labs on Mon/Thurs at a minimum CMET, phos, Mag daily thru 06/05/23 for now   Thank you for allowing pharmacy to be a part of this patient's care.  Alfredo Inch, PharmD, BCPS Clinical Pharmacist [facility] 06/01/2023 7:33 AM

## 2023-06-02 DIAGNOSIS — I4891 Unspecified atrial fibrillation: Secondary | ICD-10-CM | POA: Diagnosis not present

## 2023-06-02 DIAGNOSIS — Z9049 Acquired absence of other specified parts of digestive tract: Secondary | ICD-10-CM | POA: Diagnosis not present

## 2023-06-02 LAB — COMPREHENSIVE METABOLIC PANEL WITH GFR
ALT: 48 U/L — ABNORMAL HIGH (ref 0–44)
AST: 29 U/L (ref 15–41)
Albumin: 2.8 g/dL — ABNORMAL LOW (ref 3.5–5.0)
Alkaline Phosphatase: 54 U/L (ref 38–126)
Anion gap: 5 (ref 5–15)
BUN: 16 mg/dL (ref 8–23)
CO2: 32 mmol/L (ref 22–32)
Calcium: 8.1 mg/dL — ABNORMAL LOW (ref 8.9–10.3)
Chloride: 100 mmol/L (ref 98–111)
Creatinine, Ser: 0.95 mg/dL (ref 0.44–1.00)
GFR, Estimated: >60 mL/min (ref >60)
Glucose, Bld: 120 mg/dL — ABNORMAL HIGH (ref 70–99)
Potassium: 3.1 mmol/L — ABNORMAL LOW (ref 3.5–5.1)
Sodium: 137 mmol/L (ref 135–145)
Total Bilirubin: 0.5 mg/dL (ref 0.0–1.2)
Total Protein: 5.3 g/dL — ABNORMAL LOW (ref 6.5–8.1)

## 2023-06-02 LAB — GLUCOSE, CAPILLARY
Glucose-Capillary: 115 mg/dL — ABNORMAL HIGH (ref 70–99)
Glucose-Capillary: 150 mg/dL — ABNORMAL HIGH (ref 70–99)
Glucose-Capillary: 154 mg/dL — ABNORMAL HIGH (ref 70–99)
Glucose-Capillary: 155 mg/dL — ABNORMAL HIGH (ref 70–99)
Glucose-Capillary: 157 mg/dL — ABNORMAL HIGH (ref 70–99)

## 2023-06-02 LAB — CBC
HCT: 29.3 % — ABNORMAL LOW (ref 36.0–46.0)
Hemoglobin: 9 g/dL — ABNORMAL LOW (ref 12.0–15.0)
MCH: 27.1 pg (ref 26.0–34.0)
MCHC: 30.7 g/dL (ref 30.0–36.0)
MCV: 88.3 fL (ref 80.0–100.0)
Platelets: 308 10*3/uL (ref 150–400)
RBC: 3.32 MIL/uL — ABNORMAL LOW (ref 3.87–5.11)
RDW: 18.6 % — ABNORMAL HIGH (ref 11.5–15.5)
WBC: 9.8 10*3/uL (ref 4.0–10.5)
nRBC: 0.3 % — ABNORMAL HIGH (ref 0.0–0.2)

## 2023-06-02 LAB — MAGNESIUM: Magnesium: 2.3 mg/dL (ref 1.7–2.4)

## 2023-06-02 LAB — HEPARIN LEVEL (UNFRACTIONATED)
Heparin Unfractionated: 0.83 [IU]/mL — ABNORMAL HIGH (ref 0.30–0.70)
Heparin Unfractionated: 0.67 [IU]/mL (ref 0.30–0.70)
Heparin Unfractionated: 0.67 [IU]/mL (ref 0.30–0.70)

## 2023-06-02 LAB — SURGICAL PATHOLOGY

## 2023-06-02 LAB — PHOSPHORUS: Phosphorus: 2.6 mg/dL (ref 2.5–4.6)

## 2023-06-02 MED ORDER — POTASSIUM CHLORIDE 10 MEQ/50ML IV SOLN
10.0000 meq | INTRAVENOUS | Status: AC
  Administered 2023-06-02 (×6): 10 meq via INTRAVENOUS
  Filled 2023-06-02 (×6): qty 50

## 2023-06-02 MED ORDER — POTASSIUM CHLORIDE 10 MEQ/50ML IV SOLN
10.0000 meq | INTRAVENOUS | Status: DC
  Filled 2023-06-02 (×3): qty 50

## 2023-06-02 MED ORDER — DIGOXIN 0.25 MG/ML IJ SOLN
0.1250 mg | Freq: Every day | INTRAMUSCULAR | Status: DC
  Administered 2023-06-02 – 2023-06-05 (×4): 0.125 mg via INTRAVENOUS
  Filled 2023-06-02 (×4): qty 2

## 2023-06-02 MED ORDER — TRAVASOL 10 % IV SOLN
INTRAVENOUS | Status: AC
  Filled 2023-06-02: qty 705.6

## 2023-06-02 NOTE — Progress Notes (Signed)
 PHARMACY - TOTAL PARENTERAL NUTRITION CONSULT NOTE   Indication: Prolonged ileus  Patient Measurements: Height: 5\' 2"  (157.5 cm) Weight: 74.9 kg (165 lb 2 oz) IBW/kg (Calculated) : 50.1 TPN AdjBW (KG): 57.1 Body mass index is 30.2 kg/m. Usual Weight:   Assessment: Patient is a 68 y.o F with a recent diagnosis of colon cancer who presented to [facility] on 05/25/23 for right hemicolectomy. Pharmacy has been consulted on 05/31/23 to start TPN for ileus.  Glucose / Insulin: A1c 6.3; no hx DM - CBGs (goal <150): 138-159 - Past 24 hr insulin aspart usage: 6 units Electrolytes: K low at 3.1 (amount decreased in yesterday's formulation) - other lytes WNL including CoCa at 9.38 - goal for ileus: Mag >2, K >4  Renal: Scr 0.95 (crcl~54), BUN wnl Hepatic: Alk phos WNL, AST/ALT WNL, Total Protein elevated at 5.5, albumin low at 2.9, Total Bilirubin WNL at 0.4 Intake / Output; MIVF: no current MIVF - I/O: -363 mL in 24 hrs - UOP: 850 mL - NG/GT: 550 mL - last BM: 05/27/23 GI Imaging: - 5/24 and Xray: Mildly dilated loops of small bowel.This is nonspecific and could represent ileus or partial small bowel obstruction. - 5/26 abd Xray: Persistent small bowel dilatation  - 5/28 abd CT: Possible small-bowel obstruction with transition point in the right anterior abdomen. Primary differential consideration is enteritis with ileus as there is wall thickening and mucosal hyperenhancement of the small bowel immediately downstream from the transition point. GI Surgeries / Procedures:  - 5/22: Laparoscopic right hemicolectomy   Central access: pending PICC placement  TPN start date: 5/28- pending PICC placement   Nutritional Goals: Goal TPN rate is 80 mL/hr (provides 94 g of protein and 1959 kcals per day)  RD Assessment:  Estimated Needs Total Energy Estimated Needs: 1800-2000 Total Protein Estimated Needs: 90-100 grams Total Fluid Estimated Needs: >1.8 L/day  Current Nutrition:  NPO  - has  NGT  Plan:  KCl 10 mEq IV q1h x 6  Increase TPN to 60 mL/hr at 1800 Electrolytes in TPN:  Na 154 mEq/L K 30 mEq/L - increased Ca 5 mEq/L Mg 3 mEq/L Phos 15 mmol/L Cl:Ac 1:2 Add standard MVI and trace elements to TPN Continue SSI sensitive q6h and adjust as needed  Thiamine 100 mg daily x5 days (5/28 - 6/1) Continue MIVF at 35 mL/hr Monitor TPN labs on Mon/Thurs at a minimum CMET, phos, Mag daily thru 06/05/23 for now   Thank you for allowing pharmacy to be a part of this patient's care.  Alfredo Inch, PharmD, BCPS Clinical Pharmacist [place] 06/02/2023 8:56 AM

## 2023-06-02 NOTE — Progress Notes (Signed)
 PHARMACY - ANTICOAGULATION CONSULT NOTE  Pharmacy Consult for heparin   Indication: atrial fibrillation (PTA Xarelto  on hold)  Allergies  Allergen Reactions   Iodinated Contrast Media Hives and Dermatitis   Linzess [Linaclotide] Other (See Comments)    Caused horrible stomach pain and gas   Atorvastatin  Other (See Comments)    Gas pain in abdomen with no relief   Other Diarrhea and Other (See Comments)    IBS- Special diet   Milk-Related Compounds Diarrhea and Other (See Comments)    Has IBS   Rosuvastatin  Calcium  Other (See Comments)    Worsening GI upset   Simvastatin Other (See Comments)    Myalgia & GI upset   Eluxadoline Nausea And Vomiting and Other (See Comments)    Viberzi - stomach pain   Iohexol  Hives, Rash and Other (See Comments)   Marinol [Dronabinol] Palpitations and Other (See Comments)    "Made me feel high and almost passed out"   Red Dye #40 (Allura Red) Hives and Rash    Red contrast dye     Patient Measurements: Height: 5\' 2"  (157.5 cm) Weight: 74.9 kg (165 lb 2 oz) IBW/kg (Calculated) : 50.1 HEPARIN  DW (KG): 67.3  Vital Signs: Temp: 98.6 F (37 C) (05/30 1231) Temp Source: Oral (05/30 1231) BP: 113/64 (05/30 1231) Pulse Rate: 90 (05/30 1231)  Labs: Recent Labs    05/31/23 0504 05/31/23 0630 06/01/23 0315 06/01/23 0500 06/01/23 1640 06/02/23 0349 06/02/23 1326  HGB 9.4*  --  8.6*  --   --  9.0*  --   HCT 30.4*  --  28.7*  --   --  29.3*  --   PLT 383  --  359  --   --  308  --   HEPARINUNFRC 0.84*   < >  --    < > 0.13* 0.83* 0.67  CREATININE 1.07*  --  1.00  --   --  0.95  --    < > = values in this interval not displayed.    Estimated Creatinine Clearance: 54.4 mL/min (by C-G formula based on SCr of 0.95 mg/dL).   Medical History: Past Medical History:  Diagnosis Date   ACS (acute coronary syndrome) (HCC) 01/06/2020   With progressive angina while troponin elevation--borderline-NON-STEMI   ANTERIOR STEMI of LAD (x2). 01/19/1996    Complicated by VF arrest, and anoxic brain injury with status epilepticus; POBA of LAD --> 6 months later, after 2 vessel CABG she had postop complication with occluded LIMA/second anterior STEMI   CAD in native artery & Grafts 01/1996   a) 1/'98: Ant STEMI => LAD POBA; b) 2/'98: 95% prox LAD @ POBA site --> BMS PCI (3.0 mm x 25 mm); c) 7/98 (5 months later) => BMS ISR of LAD--> CABGx2 (LIMA-LAD, SVG-D1 -> Emergent redo w/ SVG-LAD b/c acute LIMA occlusion); d) 01/2020: ACS -> DES PCI of SVG-LAD (75%&85%), TO of SVG-D1.   COPD (chronic obstructive pulmonary disease) (HCC)    Dyspnea    GERD (gastroesophageal reflux disease)    Headache    Hiatal hernia    Hyperlipidemia    Hypertension    Hypothyroidism    On Synthroid    Iron deficiency anemia    Irritable bowel syndrome    Followed by Dr. Tami Falcon.   Ischemic cardiomyopathy 01/03/2020   ECHO (ACS): EF 45-50% (by Cath EF 40-45%) - Apical Inferior/Apical Anteroseptal & Apex akinetic, GR II DD.    PAF (paroxysmal atrial fibrillation) (HCC) 05/03/2014   New  onset - originally with RVR   Pneumonia    x 2   PONV (postoperative nausea and vomiting)    very gag reflex   S/P CABG x 2; with EMERGENT Redo x 1. 07/1996   Initially LIMA-LAD, SVG-D1 --> immediate LIMA failure post CABG with anterior MI --> urgent redo SVG-LAD beyond D2.; Widely patent grafts as of 2005 with left dominant system. Graft to the diagonal branch has a very small target vessel but the vein graft to LAD retrograde fills a large bifurcating D2 and antegrade fills a large D3.   Tobacco abuse     Medications:  - on Xarelto  20 mg daily PTA  Assessment: Patient is a 68 y.o with hx afib on Xarelto  PTA and was recently diagnosed with colon cancer who presented to [facility] on 05/25/23 for right hemicolectomy.  She underwent lap right hemicolectomy on 05/25/23 with heparin  drip started post-op on 05/28/23.  Today, 06/02/2023: - 13:26 Heparin  level = 0.67, therapeutic with IV  heparin  infusing at 850 units/hr - Hgb low but improved from yesterday at 9.0, plts WNL - No bleeding or infusion related concerns reported by RN  Goal of Therapy:  Heparin  level 0.3-0.7 units/ml Monitor platelets by anticoagulation protocol: Yes   Plan:  - Continue IV heparin  infusion at 850 units/hr - Recheck 8 hr confirmatory heparin  level - Monitor daily heparin  level, CBC, signs/symptoms of bleeding   Dominique Barber, PharmD, BCPS Clinical Pharmacist 06/02/2023 2:12 PM

## 2023-06-02 NOTE — Progress Notes (Addendum)
 Provided follow up support to Dominique Barber and her husband.  She was anxious last night thinking about the upcoming procedure.  She feels that she cannot quite regain calm today.  Provided listening and emotional support.

## 2023-06-02 NOTE — Progress Notes (Signed)
 Mobility Specialist - Progress Note  (RA) Pre-mobility: 88 bpm HR, 95% SpO2 During mobility: 149 bpm HR, 93% SpO2 Post-mobility: 103 bpm HR, 95% SPO2   06/02/23 1320  Mobility  Activity Ambulated with assistance in hallway  Level of Assistance Standby assist, set-up cues, supervision of patient - no hands on  Assistive Device Front wheel walker  Distance Ambulated (ft) 80 ft  Range of Motion/Exercises Active  Activity Response Tolerated fair  Mobility Referral Yes  Mobility visit 1 Mobility  Mobility Specialist Start Time (ACUTE ONLY) 1305  Mobility Specialist Stop Time (ACUTE ONLY) 1320  Mobility Specialist Time Calculation (min) (ACUTE ONLY) 15 min   Pt was found on recliner chair eager to ambulate. Limited distance due to pt going into A-fib and HR reaching 140s. Returned to recliner chair with all needs met. Call bell in reach.  Lorna Rose,  Mobility Specialist Can be reached via Secure Chat

## 2023-06-02 NOTE — Progress Notes (Signed)
 PROGRESS NOTE  Dominique Barber  DOB: 1955-01-26  PCP: Abraham Abo, MD ZOX:[id number]  DOA: 05/25/2023  LOS: 8 days  Hospital Day: 9  Brief narrative: Dominique Barber is a 68 y.o. female with PMH significant for HTN, HLD, CAD with h/o V-fib arrest and subsequent CABG, ischemic cardiomyopathy, paroxysmal A-fib, COPD, not on oxygen at home. 03/17/2023, patient underwent colonoscopy by Dr. Nickey Barn for iron deficiency anemia.  She was found to have tumor in the ascending colon, biopsy showed moderately differentiated invasive adenocarcinoma. She was seen by general surgery and as planned underwent elective right hemicolectomy on 5/22. Hospitalist service was consulted for postoperative medical management. 5/22, cardiology service was also consulted for A-fib with RVR  Subjective: Patient was seen and examined this morning.  Propped up in bed.  Still no return of bowel function  NG tube suction going on. It seems Foley catheter was removed this morning  Assessment and plan: Colon cancer S/p right hemicolectomy 5/22 Dr. Camilo Cella Post-operative ileus Currently remains in postoperative ileus.  General surgery following. NG tube attached to suction. No return of bowel function yet. Currently TPN Pain management with tramadol  as needed and Dilaudid  as needed Monitor electrolytes  Hypokalemia Potassium level low at 3.1 this morning.  Pharmacy following for replacement Recent Labs  Lab 05/28/23 0502 05/29/23 0458 05/30/23 0426 05/31/23 0504 06/01/23 0315 06/02/23 0349  K 5.1 4.6 3.5 4.1 4.9 3.1*  MG 2.3 2.5*  --  2.2 2.5* 2.3  PHOS 2.7  --   --  2.9 3.9 2.6   Hyponatremia Mild likely because of GI fluid loss and no oral intake. Currently on LR.  Continue to monitor Recent Labs  Lab 05/27/23 0553 05/28/23 0502 05/29/23 0458 05/30/23 0426 05/31/23 0504 06/01/23 0315 06/02/23 0349  NA 130* 131* 131* 129* 131* 132* 137   A-fib with RVR  H/o paroxysmal A-fib 5/25,  patient went to RVR.  Cardizem  drip was started Cardiology was consulted Remained on Cardizem  drip.  Also on digoxin  For anticoag, currently on heparin  drip.  Plan to switch to Xarelto  when oral intake is insured  CAD/CABG Currently does not have any anginal symptoms.   PTA meds-Xarelto , PCSK9i as an outpatient Xarelto  plan as above  Hyperglycemia A1c 6.3 on 05/27/2023 Sugar level elevated with TPN. Continue SSI/Accu-Cheks Recent Labs  Lab 06/01/23 1155 06/01/23 1845 06/02/23 0003 06/02/23 0558 06/02/23 0735  GLUCAP 159* 138* 154* 155* 150*    Hypothyroidism To resume levothyroxine  when able to take p.o.  Acute urinary retention It seems Foley catheter was removed this morning. Voiding trial in process. Return of bowel function would help bladder function as well    Mobility: Encourage ambulation.  Uses a walker  Goals of care   Code Status: Full Code     DVT prophylaxis:  SCD's Start: 05/25/23 1501 Place TED hose Start: 05/25/23 1501   Antimicrobials: None Fluid: TPN Family Communication: None at bedside  Status: Inpatient Level of care:  Progressive   Patient is from: Home Needs to continue in-hospital care: Remains in postop ileus Anticipated d/c to: Hopefully home after clinical improvement   Diet:  Diet Order             Diet NPO time specified Except for: Ice Chips, Sips with Meds  Diet effective now                   Scheduled Meds:  Chlorhexidine  Gluconate Cloth  6 each Topical Daily   digoxin   0.125  mg Intravenous Daily   insulin aspart  0-9 Units Subcutaneous Q6H   mouth rinse  15 mL Mouth Rinse 4 times per day   thiamine (VITAMIN B1) injection  100 mg Intravenous Daily    PRN meds: albuterol , diphenhydrAMINE  **OR** diphenhydrAMINE , hydrALAZINE , HYDROmorphone  (DILAUDID ) injection, lidocaine , lip balm, methocarbamol  (ROBAXIN ) injection, metoprolol  tartrate, nitroGLYCERIN , ondansetron  **OR** ondansetron  (ZOFRAN ) IV, mouth rinse,  prochlorperazine, simethicone , sodium chloride  flush, traMADol   Infusions:   diltiazem  (CARDIZEM ) infusion 10 mg/hr (06/02/23 1037)   heparin  850 Units/hr (06/02/23 0459)   potassium chloride  10 mEq (06/02/23 1117)   TPN ADULT (ION) 40 mL/hr at 06/01/23 1735   TPN ADULT (ION)      Antimicrobials: Anti-infectives (From admission, onward)    Start     Dose/Rate Route Frequency Ordered Stop   05/25/23 1400  neomycin (MYCIFRADIN) tablet 1,000 mg  Status:  Discontinued       Placed in "And" Linked Group   1,000 mg Oral 3 times per day 05/25/23 0835 05/25/23 0840   05/25/23 1400  metroNIDAZOLE (FLAGYL) tablet 1,000 mg  Status:  Discontinued       Placed in "And" Linked Group   1,000 mg Oral 3 times per day 05/25/23 0835 05/25/23 0840   05/25/23 0845  cefoTEtan (CEFOTAN) 2 g in sodium chloride  0.9 % 100 mL IVPB        2 g 200 mL/hr over 30 Minutes Intravenous On call to O.R. 05/25/23 0835 05/26/23 0809       Objective: Vitals:   06/02/23 1000 06/02/23 1100  BP: 128/71 118/66  Pulse: 71 64  Resp: 18 20  Temp:    SpO2: 91% 94%    Intake/Output Summary (Last 24 hours) at 06/02/2023 1206 Last data filed at 06/02/2023 0613 Gross per 24 hour  Intake 1037.04 ml  Output 1400 ml  Net -362.96 ml   Filed Weights   05/31/23 0537 06/01/23 0500 06/02/23 0513  Weight: 73.9 kg 73.7 kg 74.9 kg   Weight change: 1.2 kg Body mass index is 30.2 kg/m.   Physical Exam: General exam: Pleasant, elderly Caucasian female.  Skin: No rashes, lesions or ulcers. HEENT: Atraumatic, normocephalic, no obvious bleeding.  Has NG tube attached to suction Lungs: Diminished entry in both bases.  Otherwise clear to auscultation bilaterally CVS: S1, S2, no murmur,   GI/Abd: Soft, postop tenderness present, distended.  Tympanic on percussion, bowel sound sluggish,   CNS: Alert, awake, oriented x 3 Psychiatry: Sad affect Extremities: Trace bilateral pedal edema, no calf tenderness,   Data Review: I have  personally reviewed the laboratory data and studies available.  F/u labs ordered Unresulted Labs (From admission, onward)     Start     Ordered   06/05/23 0500  Triglycerides  (Standard TPN Labs (all labs to be drawn at 0500))  Every Monday (0500),   R     Question:  Specimen collection method  Answer:  Lab=Lab collect   05/31/23 0647   06/03/23 0500  Heparin  level (unfractionated)  Daily,   R     Question:  Specimen collection method  Answer:  IV Team=IV Team collect   06/01/23 1827   06/02/23 1300  Heparin  level (unfractionated)  Once-Timed,   TIMED       Question:  Specimen collection method  Answer:  IV Team=IV Team collect   06/02/23 0503   06/02/23 0500  Comprehensive metabolic panel  Daily,   R     Question:  Specimen collection method  Answer:  Lab=Lab collect   05/31/23 0817   06/02/23 0500  Magnesium  Daily,   R     Question:  Specimen collection method  Answer:  Lab=Lab collect   05/31/23 0817   06/02/23 0500  Phosphorus  Daily,   R     Question:  Specimen collection method  Answer:  Lab=Lab collect   05/31/23 0817   06/01/23 0500  Comprehensive metabolic panel  (Standard TPN Labs (all labs to be drawn at 0500))  Every Mon,Thu (0500),   R     Question:  Specimen collection method  Answer:  Lab=Lab collect   05/31/23 0647   06/01/23 0500  Magnesium  (Standard TPN Labs (all labs to be drawn at 0500))  Every Mon,Thu (0500),   R     Question:  Specimen collection method  Answer:  Lab=Lab collect   05/31/23 0647   06/01/23 0500  Phosphorus  (Standard TPN Labs (all labs to be drawn at 0500))  Every Mon,Thu (0500),   R     Question:  Specimen collection method  Answer:  Lab=Lab collect   05/31/23 0647   05/29/23 0500  CBC  Daily,   R      05/28/23 1104            Signed, Hoyt Macleod, MD [facility] 06/02/2023

## 2023-06-02 NOTE — Progress Notes (Signed)
 PHARMACY - ANTICOAGULATION CONSULT NOTE  Pharmacy Consult for heparin   Indication: atrial fibrillation (PTA Xarelto  on hold)  Allergies  Allergen Reactions   Iodinated Contrast Media Hives and Dermatitis   Linzess [Linaclotide] Other (See Comments)    Caused horrible stomach pain and gas   Atorvastatin  Other (See Comments)    Gas pain in abdomen with no relief   Other Diarrhea and Other (See Comments)    IBS- Special diet   Milk-Related Compounds Diarrhea and Other (See Comments)    Has IBS   Rosuvastatin  Calcium  Other (See Comments)    Worsening GI upset   Simvastatin Other (See Comments)    Myalgia & GI upset   Eluxadoline Nausea And Vomiting and Other (See Comments)    Viberzi - stomach pain   Iohexol  Hives, Rash and Other (See Comments)   Marinol [Dronabinol] Palpitations and Other (See Comments)    "Made me feel high and almost passed out"   Red Dye #40 (Allura Red) Hives and Rash    Red contrast dye     Patient Measurements: Height: 5\' 2"  (157.5 cm) Weight: 74.9 kg (165 lb 2 oz) IBW/kg (Calculated) : 50.1 HEPARIN  DW (KG): 67.3  Vital Signs: Temp: 98.2 F (36.8 C) (05/30 2133) Temp Source: Oral (05/30 2133) BP: 126/71 (05/30 1700) Pulse Rate: 66 (05/30 2133)  Labs: Recent Labs    05/31/23 0504 05/31/23 0630 06/01/23 0315 06/01/23 0500 06/02/23 0349 06/02/23 1326 06/02/23 2056  HGB 9.4*  --  8.6*  --  9.0*  --   --   HCT 30.4*  --  28.7*  --  29.3*  --   --   PLT 383  --  359  --  308  --   --   HEPARINUNFRC 0.84*   < >  --    < > 0.83* 0.67 0.67  CREATININE 1.07*  --  1.00  --  0.95  --   --    < > = values in this interval not displayed.    Estimated Creatinine Clearance: 54.4 mL/min (by C-G formula based on SCr of 0.95 mg/dL).   Medical History: Past Medical History:  Diagnosis Date   ACS (acute coronary syndrome) (HCC) 01/06/2020   With progressive angina while troponin elevation--borderline-NON-STEMI   ANTERIOR STEMI of LAD (x2). 01/19/1996    Complicated by VF arrest, and anoxic brain injury with status epilepticus; POBA of LAD --> 6 months later, after 2 vessel CABG she had postop complication with occluded LIMA/second anterior STEMI   CAD in native artery & Grafts 01/1996   a) 1/'98: Ant STEMI => LAD POBA; b) 2/'98: 95% prox LAD @ POBA site --> BMS PCI (3.0 mm x 25 mm); c) 7/98 (5 months later) => BMS ISR of LAD--> CABGx2 (LIMA-LAD, SVG-D1 -> Emergent redo w/ SVG-LAD b/c acute LIMA occlusion); d) 01/2020: ACS -> DES PCI of SVG-LAD (75%&85%), TO of SVG-D1.   COPD (chronic obstructive pulmonary disease) (HCC)    Dyspnea    GERD (gastroesophageal reflux disease)    Headache    Hiatal hernia    Hyperlipidemia    Hypertension    Hypothyroidism    On Synthroid    Iron deficiency anemia    Irritable bowel syndrome    Followed by Dr. Tami Falcon.   Ischemic cardiomyopathy 01/03/2020   ECHO (ACS): EF 45-50% (by Cath EF 40-45%) - Apical Inferior/Apical Anteroseptal & Apex akinetic, GR II DD.    PAF (paroxysmal atrial fibrillation) (HCC) 05/03/2014   New  onset - originally with RVR   Pneumonia    x 2   PONV (postoperative nausea and vomiting)    very gag reflex   S/P CABG x 2; with EMERGENT Redo x 1. 07/1996   Initially LIMA-LAD, SVG-D1 --> immediate LIMA failure post CABG with anterior MI --> urgent redo SVG-LAD beyond D2.; Widely patent grafts as of 2005 with left dominant system. Graft to the diagonal branch has a very small target vessel but the vein graft to LAD retrograde fills a large bifurcating D2 and antegrade fills a large D3.   Tobacco abuse     Medications:  - on Xarelto  20 mg daily PTA  Assessment: Patient is a 68 y.o with hx afib on Xarelto  PTA and was recently diagnosed with colon cancer who presented to [facility] on 05/25/23 for right hemicolectomy.  She underwent lap right hemicolectomy on 05/25/23 with heparin  drip started post-op on 05/28/23.  Today, 06/02/2023: - 13:26 Heparin  level = 0.67, therapeutic with IV  heparin  infusing at 850 units/hr - Hgb low but improved from yesterday at 9.0, plts WNL - No bleeding or infusion related concerns reported by RN  2nd shift f/u 20:56 heparin  level = 0.67 (therapeutic) with heparin  gtt @ 850 units/hr No complications of therapy noted  Goal of Therapy:  Heparin  level 0.3-0.7 units/ml Monitor platelets by anticoagulation protocol: Yes   Plan:  - Continue IV heparin  infusion at 850 units/hr - Monitor daily heparin  level, CBC, signs/symptoms of bleeding   Rulon Councilman, PharmD  Clinical Pharmacist 06/02/2023 9:52 PM

## 2023-06-02 NOTE — Progress Notes (Addendum)
 8 Days Post-Op   Subjective/Chief Complaint: Some flatus, smears of bm.  Denies any abdominal pain.  Objective: Vital signs in last 24 hours: Temp:  [97.5 F (36.4 C)-98.3 F (36.8 C)] 97.8 F (36.6 C) (05/30 0609) Pulse Rate:  [67-99] 82 (05/29 1730) Resp:  [15-21] 20 (05/29 1730) BP: (106-139)/(64-97) 129/71 (05/30 0609) SpO2:  [92 %-95 %] 93 % (05/30 0609) Weight:  [74.9 kg] 74.9 kg (05/30 0513) Last BM Date : 05/26/23  Intake/Output from previous day: 05/29 0701 - 05/30 0700 In: 1037 [I.V.:1037] Out: 850 [Urine:850] Intake/Output this shift: Total I/O In: 1037 [I.V.:1037] Out: 400 [Urine:400]  General appearance: alert and cooperative Resp: clear to auscultation bilaterally Cardio: NSR  Incision/Wound:?mild distention; incision c/d/I; not significantly tender  Lab Results:  Recent Labs    06/01/23 0315 06/02/23 0349  WBC 11.1* 9.8  HGB 8.6* 9.0*  HCT 28.7* 29.3*  PLT 359 308   BMET Recent Labs    06/01/23 0315 06/02/23 0349  NA 132* 137  K 4.9 3.1*  CL 94* 100  CO2 25 32  GLUCOSE 661* 120*  BUN 14 16  CREATININE 1.00 0.95  CALCIUM  8.0* 8.1*   PT/INR No results for input(s): "LABPROT", "INR" in the last 72 hours. ABG No results for input(s): "PHART", "HCO3" in the last 72 hours.  Invalid input(s): "PCO2", "PO2"  Studies/Results: US  EKG SITE RITE Result Date: 05/31/2023 If Site Rite image not attached, placement could not be confirmed due to current cardiac rhythm.   Anti-infectives: Anti-infectives (From admission, onward)    Start     Dose/Rate Route Frequency Ordered Stop   05/25/23 1400  neomycin (MYCIFRADIN) tablet 1,000 mg  Status:  Discontinued       Placed in "And" Linked Group   1,000 mg Oral 3 times per day 05/25/23 0835 05/25/23 0840   05/25/23 1400  metroNIDAZOLE (FLAGYL) tablet 1,000 mg  Status:  Discontinued       Placed in "And" Linked Group   1,000 mg Oral 3 times per day 05/25/23 0835 05/25/23 0840   05/25/23 0845   cefoTEtan (CEFOTAN) 2 g in sodium chloride  0.9 % 100 mL IVPB        2 g 200 mL/hr over 30 Minutes Intravenous On call to O.R. 05/25/23 1610 05/26/23 0809       Assessment/Plan: s/p Procedure(s) with comments: COLECTOMY, RIGHT, LAPAROSCOPIC (Right) - LAPAROSCOPIC RIGHT HEMICOLECTOMY LYSIS, ADHESIONS, LAPAROSCOPIC (N/A)  Ileus noted - continue NGT; cont TPN; all of this was discussed with her and rationale therein.  She expressed understanding and agreement.  NPO, MIVF, TPN A fib- rate better controlled  Strict I&O - I have again discussed with nursing that NG outputs have not been documented and this is making it difficult for us  to understand if ileus is resolving.  I have yet again ordered 'strict intake and outputs"  CT 5/28 - ileus favored  All the above has been reviewed with her.  Hopefully over the coming days, things begin to improve and open up.  In the meantime, plan to continue the NG tube.  IV based medications for critical medications that she is unlikely able to absorb much enterally with an NG.  From an anticoagulation perspective, she is on a heparin  drip.  Noted plans for Eliquis once tolerating oral.  Appreciate medicine / cardiology assistance with this pt    LOS: 8 days    Melvenia Stabs  MD  06/02/2023

## 2023-06-02 NOTE — Progress Notes (Signed)
 Dominique Barber reports that she is having a hard day today.  She is hurting, but doesn't want to take much pain meds because that slows down her bowel movement as well. Today is the anniversary of her daughter's disappearance.  I provided grief support as she shared pictures and spoke about her daughter.  She shared other stories as well about family.

## 2023-06-02 NOTE — Progress Notes (Signed)
 Foley catheter removed at 6:50 am.

## 2023-06-02 NOTE — Progress Notes (Addendum)
 Rounding Note   Patient Name: Dominique Barber Date of Encounter: 06/02/2023  [facility] Cardiologist: Randene Bustard, MD   Subjective  Pt had a difficult day yesterday with anxiety and increased pain. Ambulation was aborted due to RVR. Rates reasonably controlled at rest in the 80-90s.  Scheduled Meds:  Chlorhexidine Gluconate Cloth  6 each Topical Daily   insulin aspart  0-9 Units Subcutaneous Q6H   mouth rinse  15 mL Mouth Rinse 4 times per day   thiamine (VITAMIN B1) injection  100 mg Intravenous Daily   Continuous Infusions:  diltiazem  (CARDIZEM ) infusion 12.5 mg/hr (06/02/23 0305)   heparin  850 Units/hr (06/02/23 0459)   TPN ADULT (ION) 40 mL/hr at 06/01/23 1735   PRN Meds: albuterol , diphenhydrAMINE  **OR** diphenhydrAMINE , hydrALAZINE , HYDROmorphone  (DILAUDID ) injection, lidocaine , lip balm, methocarbamol  (ROBAXIN ) injection, metoprolol  tartrate, nitroGLYCERIN , ondansetron  **OR** ondansetron  (ZOFRAN ) IV, mouth rinse, prochlorperazine, simethicone , sodium chloride  flush, traMADol   Vital Signs  Vitals:   06/01/23 2118 06/02/23 0513 06/02/23 0609 06/02/23 0700  BP:   129/71 113/62  Pulse:    83  Resp:    20  Temp: 98.3 F (36.8 C)  97.8 F (36.6 C)   TempSrc: Oral  Oral   SpO2:   93% 94%  Weight:  74.9 kg    Height:        Intake/Output Summary (Last 24 hours) at 06/02/2023 0820 Last data filed at 06/02/2023 4098 Gross per 24 hour  Intake 1037.04 ml  Output 1400 ml  Net -362.96 ml      06/02/2023    5:13 AM 06/01/2023    5:00 AM 05/31/2023    5:37 AM  Last 3 Weights  Weight (lbs) 165 lb 2 oz 162 lb 7.7 oz 162 lb 14.7 oz  Weight (kg) 74.9 kg 73.7 kg 73.9 kg      Telemetry Afib with VR 80-90s, no significant pauses - Personally Reviewed  ECG  No new tracings - Personally Reviewed  Physical Exam  GEN: No acute distress.   Neck: No JVD Cardiac: irregular rhythm, regular rate.  Respiratory: Clear to auscultation bilaterally. GI:  distended, nontender MS: No edema; No deformity. Neuro:  Nonfocal  Psych: Normal affect   Labs High Sensitivity Troponin:  No results for input(s): "TROPONINIHS" in the last 720 hours.   Chemistry Recent Labs  Lab 05/31/23 0504 06/01/23 0315 06/02/23 0349  NA 131* 132* 137  K 4.1 4.9 3.1*  CL 98 94* 100  CO2 17* 25 32  GLUCOSE 145* 661* 120*  BUN 12 14 16   CREATININE 1.07* 1.00 0.95  CALCIUM  8.4* 8.0* 8.1*  MG 2.2 2.5* 2.3  PROT  --  5.5* 5.3*  ALBUMIN  --  2.9* 2.8*  AST  --  30 29  ALT  --  37 48*  ALKPHOS  --  56 54  BILITOT  --  0.4 0.5  GFRNONAA 57* >60 >60  ANIONGAP 16* 13 5    Lipids No results for input(s): "CHOL", "TRIG", "HDL", "LABVLDL", "LDLCALC", "CHOLHDL" in the last 168 hours.  Hematology Recent Labs  Lab 05/31/23 0504 06/01/23 0315 06/02/23 0349  WBC 9.8 11.1* 9.8  RBC 3.44* 3.12* 3.32*  HGB 9.4* 8.6* 9.0*  HCT 30.4* 28.7* 29.3*  MCV 88.4 92.0 88.3  MCH 27.3 27.6 27.1  MCHC 30.9 30.0 30.7  RDW 18.5* 18.9* 18.6*  PLT 383 359 308   Thyroid   Recent Labs  Lab 05/31/23 0504  TSH 1.078  FREET4 1.14*  BNPNo results for input(s): "BNP", "PROBNP" in the last 168 hours.  DDimer No results for input(s): "DDIMER" in the last 168 hours.   Radiology  No results found.  Cardiac Studies  Echo 03/2023:  1. Left ventricular ejection fraction, by estimation, is 55 to 60%. The  left ventricle has normal function. The left ventricle has no regional  wall motion abnormalities. Left ventricular diastolic parameters were  normal. Elevated left atrial pressure.  The average left ventricular global longitudinal strain is -17.6 %. The  global longitudinal strain is abnormal.   2. Right ventricular systolic function is normal. The right ventricular  size is normal. Tricuspid regurgitation signal is inadequate for assessing  PA pressure.   3. The mitral valve is normal in structure. Trivial mitral valve  regurgitation. No evidence of mitral stenosis.    4. The aortic valve is tricuspid. Aortic valve regurgitation is not  visualized. No aortic stenosis is present.   5. The inferior vena cava is normal in size with greater than 50%  respiratory variability, suggesting right atrial pressure of 3 mmHg.   Patient Profile   68 y.o. female with CAD with STEMI 1998 w/ VF arrest & anoxic brain injury w/ status epilepticus, s/p blow-by w/ BMS LAD 1 mo later. ISR>> CABG 1998 w/ LIMA-LAD & SVG-D1, AMI w/ emergent re-operation w/ SVG-LAD 1998, PTCA/DES to mSVG-LAD 01/2020, prior ICM with normalized LVEF by echo 03/2023, PAF, pre-DM, HLD, hypothyroidism, COPD, cecal adenocarcinoma admitted for elective right hemicolectomy. Post-op course notable for acute renal insufficiency, post-operative ileus/sluggish transit requiring NPO status, hypokalemia, hyponatremia, leukocytosis   Assessment & Plan   PAF - telemetry with rates 80s, BP adequate - she has received IV digoxin  x 2 days  - amiodarone  PO on hold for NPO status - currently running IV cardizem  at 12.5 mg/hr - anticoagulated with heparin  IV - rates are expected to go up with ambulation - she is OK to walk the halls with assistance as long as she is asymptomatic from Afib - can increase cardizem  as needed - added daily 0.125 mg digoxin  IV    PIV infiltrate - left arm red, warm, swollen - can apply hotpacks for comfort - will try to avoid IV amiodarone    Colectomy - general surgery following - still with NG tube in place    For questions or updates, please contact [facility] Please consult [email or web address] for contact info under     Signed, Lamond Pilot, PA  06/02/2023, 8:20 AM

## 2023-06-02 NOTE — Progress Notes (Signed)
 PHARMACY - ANTICOAGULATION CONSULT NOTE  Pharmacy Consult for heparin   Indication: atrial fibrillation (PTA Xarelto  on hold)  Allergies  Allergen Reactions   Iodinated Contrast Media Hives and Dermatitis   Linzess [Linaclotide] Other (See Comments)    Caused horrible stomach pain and gas   Atorvastatin  Other (See Comments)    Gas pain in abdomen with no relief   Other Diarrhea and Other (See Comments)    IBS- Special diet   Milk-Related Compounds Diarrhea and Other (See Comments)    Has IBS   Rosuvastatin  Calcium  Other (See Comments)    Worsening GI upset   Simvastatin Other (See Comments)    Myalgia & GI upset   Eluxadoline Nausea And Vomiting and Other (See Comments)    Viberzi - stomach pain   Iohexol  Hives, Rash and Other (See Comments)   Marinol [Dronabinol] Palpitations and Other (See Comments)    "Made me feel high and almost passed out"   Red Dye #40 (Allura Red) Hives and Rash    Red contrast dye     Patient Measurements: Height: 5\' 2"  (157.5 cm) Weight: 73.7 kg (162 lb 7.7 oz) IBW/kg (Calculated) : 50.1 HEPARIN  DW (KG): 67.3  Vital Signs: Temp: 98.3 F (36.8 C) (05/29 2118) Temp Source: Oral (05/29 2118) BP: 122/74 (05/29 1730) Pulse Rate: 82 (05/29 1730)  Labs: Recent Labs    05/31/23 0504 05/31/23 0630 06/01/23 0315 06/01/23 0500 06/01/23 1640 06/02/23 0349  HGB 9.4*  --  8.6*  --   --  9.0*  HCT 30.4*  --  28.7*  --   --  29.3*  PLT 383  --  359  --   --  308  HEPARINUNFRC 0.84*   < >  --  0.16* 0.13* 0.83*  CREATININE 1.07*  --  1.00  --   --  0.95   < > = values in this interval not displayed.    Estimated Creatinine Clearance: 54 mL/min (by C-G formula based on SCr of 0.95 mg/dL).   Medical History: Past Medical History:  Diagnosis Date   ACS (acute coronary syndrome) (HCC) 01/06/2020   With progressive angina while troponin elevation--borderline-NON-STEMI   ANTERIOR STEMI of LAD (x2). 01/19/1996   Complicated by VF arrest, and  anoxic brain injury with status epilepticus; POBA of LAD --> 6 months later, after 2 vessel CABG she had postop complication with occluded LIMA/second anterior STEMI   CAD in native artery & Grafts 01/1996   a) 1/'98: Ant STEMI => LAD POBA; b) 2/'98: 95% prox LAD @ POBA site --> BMS PCI (3.0 mm x 25 mm); c) 7/98 (5 months later) => BMS ISR of LAD--> CABGx2 (LIMA-LAD, SVG-D1 -> Emergent redo w/ SVG-LAD b/c acute LIMA occlusion); d) 01/2020: ACS -> DES PCI of SVG-LAD (75%&85%), TO of SVG-D1.   COPD (chronic obstructive pulmonary disease) (HCC)    Dyspnea    GERD (gastroesophageal reflux disease)    Headache    Hiatal hernia    Hyperlipidemia    Hypertension    Hypothyroidism    On Synthroid    Iron deficiency anemia    Irritable bowel syndrome    Followed by Dr. Tami Falcon.   Ischemic cardiomyopathy 01/03/2020   ECHO (ACS): EF 45-50% (by Cath EF 40-45%) - Apical Inferior/Apical Anteroseptal & Apex akinetic, GR II DD.    PAF (paroxysmal atrial fibrillation) (HCC) 05/03/2014   New onset - originally with RVR   Pneumonia    x 2   PONV (postoperative nausea  and vomiting)    very gag reflex   S/P CABG x 2; with EMERGENT Redo x 1. 07/1996   Initially LIMA-LAD, SVG-D1 --> immediate LIMA failure post CABG with anterior MI --> urgent redo SVG-LAD beyond D2.; Widely patent grafts as of 2005 with left dominant system. Graft to the diagonal branch has a very small target vessel but the vein graft to LAD retrograde fills a large bifurcating D2 and antegrade fills a large D3.   Tobacco abuse     Medications:  - on Xarelto  20 mg daily PTA  Assessment: Patient is a 68 y.o with hx afib on Xarelto  PTA and was recently diagnosed with colon cancer who presented to [facility] on 05/25/23 for right hemicolectomy.  She underwent lap right hemicolectomy on 05/25/23 with heparin  drip started post-op on 05/28/23.  Today, 06/02/2023: - Heparin  level is now SUPRA-therapeutic at 0.83 on IV heparin  950 units/hr   >Lab drawn  from PICC line and heparin  infusing thru PIV - Hgb low but improved from yesterday at 9.0, plts WNL - No bleeding or infusion related concerns reported by RN  Goal of Therapy:  Heparin  level 0.3-0.7 units/ml Monitor platelets by anticoagulation protocol: Yes   Plan:  - Decrease heparin  infusion slightly to 850 units/hr (since previous low on 750 units/hr) - Recheck heparin  level 8h after rate decrease - Daily heparin  level and cbc - Monitor for s/sx bleeding  Arie Kurtz, PharmD, BCPS Clinical Pharmacist 06/02/2023 4:41 AM

## 2023-06-03 DIAGNOSIS — Z9049 Acquired absence of other specified parts of digestive tract: Secondary | ICD-10-CM | POA: Diagnosis not present

## 2023-06-03 DIAGNOSIS — I480 Paroxysmal atrial fibrillation: Secondary | ICD-10-CM | POA: Diagnosis not present

## 2023-06-03 LAB — COMPREHENSIVE METABOLIC PANEL WITH GFR
ALT: 61 U/L — ABNORMAL HIGH (ref 0–44)
AST: 31 U/L (ref 15–41)
Albumin: 2.7 g/dL — ABNORMAL LOW (ref 3.5–5.0)
Alkaline Phosphatase: 58 U/L (ref 38–126)
Anion gap: 9 (ref 5–15)
BUN: 20 mg/dL (ref 8–23)
CO2: 29 mmol/L (ref 22–32)
Calcium: 8 mg/dL — ABNORMAL LOW (ref 8.9–10.3)
Chloride: 98 mmol/L (ref 98–111)
Creatinine, Ser: 0.86 mg/dL (ref 0.44–1.00)
GFR, Estimated: >60 mL/min (ref >60)
Glucose, Bld: 146 mg/dL — ABNORMAL HIGH (ref 70–99)
Potassium: 3.5 mmol/L (ref 3.5–5.1)
Sodium: 136 mmol/L (ref 135–145)
Total Bilirubin: 0.5 mg/dL (ref 0.0–1.2)
Total Protein: 5.7 g/dL — ABNORMAL LOW (ref 6.5–8.1)

## 2023-06-03 LAB — GLUCOSE, CAPILLARY
Glucose-Capillary: 126 mg/dL — ABNORMAL HIGH (ref 70–99)
Glucose-Capillary: 129 mg/dL — ABNORMAL HIGH (ref 70–99)
Glucose-Capillary: 143 mg/dL — ABNORMAL HIGH (ref 70–99)
Glucose-Capillary: 159 mg/dL — ABNORMAL HIGH (ref 70–99)

## 2023-06-03 LAB — MAGNESIUM: Magnesium: 2.1 mg/dL (ref 1.7–2.4)

## 2023-06-03 LAB — PHOSPHORUS: Phosphorus: 3.3 mg/dL (ref 2.5–4.6)

## 2023-06-03 LAB — CBC
HCT: 29.6 % — ABNORMAL LOW (ref 36.0–46.0)
Hemoglobin: 8.9 g/dL — ABNORMAL LOW (ref 12.0–15.0)
MCH: 27 pg (ref 26.0–34.0)
MCHC: 30.1 g/dL (ref 30.0–36.0)
MCV: 89.7 fL (ref 80.0–100.0)
Platelets: 283 10*3/uL (ref 150–400)
RBC: 3.3 MIL/uL — ABNORMAL LOW (ref 3.87–5.11)
RDW: 18.6 % — ABNORMAL HIGH (ref 11.5–15.5)
WBC: 9 10*3/uL (ref 4.0–10.5)
nRBC: 0.2 % (ref 0.0–0.2)

## 2023-06-03 LAB — HEPARIN LEVEL (UNFRACTIONATED): Heparin Unfractionated: 0.7 [IU]/mL (ref 0.30–0.70)

## 2023-06-03 MED ORDER — POTASSIUM CHLORIDE 10 MEQ/50ML IV SOLN
10.0000 meq | INTRAVENOUS | Status: AC
  Administered 2023-06-03 (×2): 10 meq via INTRAVENOUS
  Filled 2023-06-03 (×2): qty 50

## 2023-06-03 MED ORDER — PHENOL 1.4 % MT LIQD
1.0000 | OROMUCOSAL | Status: DC | PRN
  Filled 2023-06-03: qty 177

## 2023-06-03 MED ORDER — MENTHOL 3 MG MT LOZG
1.0000 | LOZENGE | OROMUCOSAL | Status: DC | PRN
  Administered 2023-06-03: 3 mg via ORAL
  Filled 2023-06-03: qty 9

## 2023-06-03 MED ORDER — TRAVASOL 10 % IV SOLN
INTRAVENOUS | Status: AC
  Filled 2023-06-03: qty 940.8

## 2023-06-03 NOTE — Progress Notes (Signed)
 PROGRESS NOTE  TAL NEER  DOB: 11-04-1955  PCP: Abraham Abo, MD OZH:[id number]  DOA: 05/25/2023  LOS: 9 days  Hospital Day: 10  Brief narrative: Dominique Barber is a 68 y.o. female with PMH significant for HTN, HLD, CAD with h/o V-fib arrest and subsequent CABG, ischemic cardiomyopathy, paroxysmal A-fib, COPD, not on oxygen at home. 03/17/2023, patient underwent colonoscopy by Dr. Nickey Barn for iron deficiency anemia.  She was found to have tumor in the ascending colon, biopsy showed moderately differentiated invasive adenocarcinoma. She was seen by general surgery and as planned underwent elective right hemicolectomy on 5/22. Hospitalist service was consulted for postoperative medical management. 5/22, cardiology service was also consulted for A-fib with RVR  Subjective: Patient was seen and examined this morning.  Sitting up in recliner.  Not in distress.  NG tube attached to suction. She had 2 bowel movements yesterday. Also voided couple times since Foley removal yesterday. Plan from general surgery noted to continue NG suction today, check x-ray tomorrow. Has been walking on the hallway every day. No family at bedside  Assessment and plan: Colon cancer S/p right hemicolectomy 5/22 Dr. Camilo Cella Post-operative ileus Currently remains in postoperative ileus.  NG tube attached to suction. She had 2 bowel movements yesterday. Plan from general surgery noted to continue NG suction today, check x-ray tomorrow. NG tube attached to suction. Continue TPN Pain management with tramadol  as needed and Dilaudid  as needed Monitor electrolytes  Hypokalemia Potassium level improved with replacement Recent Labs  Lab 05/28/23 0502 05/29/23 0458 05/30/23 0426 05/31/23 0504 06/01/23 0315 06/02/23 0349 06/03/23 0453  K 5.1 4.6 3.5 4.1 4.9 3.1* 3.5  MG 2.3 2.5*  --  2.2 2.5* 2.3 2.1  PHOS 2.7  --   --  2.9 3.9 2.6 3.3   A-fib with RVR  H/o paroxysmal A-fib 5/25, patient went  to RVR.   Cardiology following.   Currently on Cardizem  drip, digoxin  For anticoag, currently on heparin  drip.  Plan to switch to Xarelto  when oral intake is insured  CAD/CABG Currently does not have any anginal symptoms.   PTA meds-Xarelto , [id number] as an outpatient Xarelto  plan as above  Hyperglycemia A1c 6.3 on 05/27/2023 Sugar level elevated with TPN. Continue SSI/Accu-Cheks Recent Labs  Lab 06/02/23 0558 06/02/23 0735 06/02/23 1706 06/02/23 2352 06/03/23 0534  GLUCAP 155* 150* 115* 157* 159*    Hypothyroidism To resume levothyroxine  when able to take p.o.  Acute urinary retention Foley catheter was removed on 5/30.  Voided couple times since then. Continue to monitor.    Mobility: Encourage ambulation.  Uses a walker.  Ambulates daily.  Goals of care   Code Status: Full Code     DVT prophylaxis:  SCD's Start: 05/25/23 1501 Place TED hose Start: 05/25/23 1501   Antimicrobials: None Fluid: TPN Family Communication: None at bedside  Status: Inpatient Level of care:  Progressive   Patient is from: Home Needs to continue in-hospital care: Postop ileus seems to be improving Anticipated d/c to: Hopefully home after clinical improvement   Diet:  Diet Order             Diet NPO time specified Except for: Ice Chips, Sips with Meds  Diet effective now                   Scheduled Meds:  Chlorhexidine  Gluconate Cloth  6 each Topical Daily   digoxin   0.125 mg Intravenous Daily   insulin  aspart  0-9 Units Subcutaneous Q6H  mouth rinse  15 mL Mouth Rinse 4 times per day   thiamine  (VITAMIN B1) injection  100 mg Intravenous Daily    PRN meds: albuterol , diphenhydrAMINE  **OR** diphenhydrAMINE , hydrALAZINE , HYDROmorphone  (DILAUDID ) injection, lidocaine , lip balm, menthol-cetylpyridinium, methocarbamol  (ROBAXIN ) injection, metoprolol  tartrate, nitroGLYCERIN , ondansetron  **OR** ondansetron  (ZOFRAN ) IV, mouth rinse, phenol, prochlorperazine , simethicone ,  sodium chloride  flush, traMADol    Infusions:   diltiazem  (CARDIZEM ) infusion 7.5 mg/hr (06/03/23 1000)   heparin  850 Units/hr (06/03/23 1000)   potassium chloride  50 mL/hr at 06/03/23 1000   TPN ADULT (ION) 60 mL/hr at 06/03/23 1000   TPN ADULT (ION)      Antimicrobials: Anti-infectives (From admission, onward)    Start     Dose/Rate Route Frequency Ordered Stop   05/25/23 1400  neomycin  (MYCIFRADIN ) tablet 1,000 mg  Status:  Discontinued       Placed in "And" Linked Group   1,000 mg Oral 3 times per day 05/25/23 0835 05/25/23 0840   05/25/23 1400  metroNIDAZOLE  (FLAGYL ) tablet 1,000 mg  Status:  Discontinued       Placed in "And" Linked Group   1,000 mg Oral 3 times per day 05/25/23 0835 05/25/23 0840   05/25/23 0845  cefoTEtan  (CEFOTAN ) 2 g in sodium chloride  0.9 % 100 mL IVPB        2 g 200 mL/hr over 30 Minutes Intravenous On call to O.R. 05/25/23 0835 05/26/23 0809       Objective: Vitals:   06/03/23 0936 06/03/23 1000  BP: 127/75 127/78  Pulse: 79 88  Resp: (!) 22 14  Temp:    SpO2:  96%    Intake/Output Summary (Last 24 hours) at 06/03/2023 1020 Last data filed at 06/03/2023 1000 Gross per 24 hour  Intake 2271.66 ml  Output 1350 ml  Net 921.66 ml   Filed Weights   06/01/23 0500 06/02/23 0513 06/03/23 0500  Weight: 73.7 kg 74.9 kg 73.5 kg   Weight change: -1.4 kg Body mass index is 29.64 kg/m.   Physical Exam: General exam: Pleasant, elderly Caucasian female.  Not in pain Skin: No rashes, lesions or ulcers. HEENT: Atraumatic, normocephalic, no obvious bleeding.  Has NG tube attached to suction Lungs: Diminished entry in both bases.  Otherwise clear to auscultation bilaterally.  Not on supplemental oxygen CVS: S1, S2, no murmur,   GI/Abd: Soft, postop tenderness present, distention seems to be slightly better today.  Tympanic on percussion, bowel sound present,   CNS: Alert, awake, oriented x 3 Psychiatry: Mood appropriate Extremities: Trace bilateral  pedal edema, no calf tenderness,   Data Review: I have personally reviewed the laboratory data and studies available.  F/u labs ordered Unresulted Labs (From admission, onward)     Start     Ordered   06/05/23 0500  Triglycerides  (Standard TPN Labs (all labs to be drawn at 0500))  Every Monday (0500),   R     Question:  Specimen collection method  Answer:  Lab=Lab collect   05/31/23 0647   06/03/23 0500  Heparin  level (unfractionated)  Daily,   R     Question:  Specimen collection method  Answer:  IV Team=IV Team collect   06/01/23 1827   06/02/23 0500  Comprehensive metabolic panel  Daily,   R     Question:  Specimen collection method  Answer:  Lab=Lab collect   05/31/23 0817   06/02/23 0500  Magnesium  Daily,   R     Question:  Specimen collection method  Answer:  Lab=Lab collect   05/31/23 0981   06/02/23 0500  Phosphorus  Daily,   R     Question:  Specimen collection method  Answer:  Lab=Lab collect   05/31/23 0817   06/01/23 0500  Comprehensive metabolic panel  (Standard TPN Labs (all labs to be drawn at 0500))  Every Mon,Thu (0500),   R     Question:  Specimen collection method  Answer:  Lab=Lab collect   05/31/23 0647   06/01/23 0500  Magnesium  (Standard TPN Labs (all labs to be drawn at 0500))  Every Mon,Thu (0500),   R     Question:  Specimen collection method  Answer:  Lab=Lab collect   05/31/23 0647   06/01/23 0500  Phosphorus  (Standard TPN Labs (all labs to be drawn at 0500))  Every Mon,Thu (0500),   R     Question:  Specimen collection method  Answer:  Lab=Lab collect   05/31/23 1914            Signed, Hoyt Macleod, MD [facility] 06/03/2023

## 2023-06-03 NOTE — Progress Notes (Signed)
 9 Days Post-Op   Subjective/Chief Complaint: Couple bms yesterday, no flatus, oob in chair   Objective: Vital signs in last 24 hours: Temp:  [98.1 F (36.7 C)-98.6 F (37 C)] 98.1 F (36.7 C) (05/31 0439) Pulse Rate:  [64-139] 78 (05/31 0800) Resp:  [16-22] 21 (05/31 0800) BP: (106-140)/(56-84) 120/84 (05/31 0800) SpO2:  [90 %-97 %] 95 % (05/31 0800) Weight:  [73.5 kg] 73.5 kg (05/31 0500) Last BM Date : 05/26/23  Intake/Output from previous day: 05/30 0701 - 05/31 0700 In: 1957.6 [I.V.:1638.2; IV Piggyback:319.4] Out: 1350 [Urine:350; Emesis/NG output:1000] Intake/Output this shift: Total I/O In: 124.2 [I.V.:124.2] Out: -   General nad Pulm effort normal Rate normal Ab mildly distended (although she states she is at her norm), nontender, incision clean  Lab Results:  Recent Labs    06/02/23 0349 06/03/23 0453  WBC 9.8 9.0  HGB 9.0* 8.9*  HCT 29.3* 29.6*  PLT 308 283   BMET Recent Labs    06/02/23 0349 06/03/23 0453  NA 137 136  K 3.1* 3.5  CL 100 98  CO2 32 29  GLUCOSE 120* 146*  BUN 16 20  CREATININE 0.95 0.86  CALCIUM  8.1* 8.0*   PT/INR No results for input(s): "LABPROT", "INR" in the last 72 hours. ABG No results for input(s): "PHART", "HCO3" in the last 72 hours.  Invalid input(s): "PCO2", "PO2"  Studies/Results: No results found.  Anti-infectives: Anti-infectives (From admission, onward)    Start     Dose/Rate Route Frequency Ordered Stop   05/25/23 1400  neomycin  (MYCIFRADIN ) tablet 1,000 mg  Status:  Discontinued       Placed in "And" Linked Group   1,000 mg Oral 3 times per day 05/25/23 0835 05/25/23 0840   05/25/23 1400  metroNIDAZOLE  (FLAGYL ) tablet 1,000 mg  Status:  Discontinued       Placed in "And" Linked Group   1,000 mg Oral 3 times per day 05/25/23 0835 05/25/23 0840   05/25/23 0845  cefoTEtan  (CEFOTAN ) 2 g in sodium chloride  0.9 % 100 mL IVPB        2 g 200 mL/hr over 30 Minutes Intravenous On call to O.R. 05/25/23 0835  05/26/23 0809       Assessment/Plan: POD 9 lap right colon -appears ileus may be getting better with bm, exam same, ng with 1000 out and not very bilious -will cont current mgt today -check xray in am -continue TPN and heparin  gtt   Dominique Barber 06/03/2023

## 2023-06-03 NOTE — Progress Notes (Signed)
 PHARMACY - TOTAL PARENTERAL NUTRITION CONSULT NOTE   Indication: Prolonged ileus  Patient Measurements: Height: 5\' 2"  (157.5 cm) Weight: 73.5 kg (162 lb 0.6 oz) IBW/kg (Calculated) : 50.1 TPN AdjBW (KG): 57.1 Body mass index is 29.64 kg/m. Usual Weight:   Assessment: Patient is a 68 y.o F with a recent diagnosis of colon cancer who presented to [facility] on 05/25/23 for right hemicolectomy. Pharmacy has been consulted on 05/31/23 to start TPN for ileus.  Glucose / Insulin : A1c 6.3; no hx DM - CBGs (goal <150): 115-159 - Past 24 hr insulin  aspart usage: 6 units Electrolytes: K low at 3.5 - other lytes WNL including CoCa at 9.36 - goal for ileus: Mag >2, K >4  Renal: Scr 0.95 (crcl~54), BUN wnl Hepatic: Alk phos WNL, AST/ALT WNL, Total Protein elevated at 5.5, albumin low at 2.9, Total Bilirubin WNL at 0.4 Intake / Output; MIVF: no current MIVF - I/O: -363 mL in 24 hrs - UOP: 850 mL - NG/GT: 550 mL - last BM: 05/27/23 GI Imaging: - 5/24 and Xray: Mildly dilated loops of small bowel.This is nonspecific and could represent ileus or partial small bowel obstruction. - 5/26 abd Xray: Persistent small bowel dilatation  - 5/28 abd CT: Possible small-bowel obstruction with transition point in the right anterior abdomen. Primary differential consideration is enteritis with ileus as there is wall thickening and mucosal hyperenhancement of the small bowel immediately downstream from the transition point. GI Surgeries / Procedures:  - 5/22: Laparoscopic right hemicolectomy   Central access: pending PICC placement  TPN start date: 5/28- pending PICC placement   Nutritional Goals: Goal TPN rate is 80 mL/hr (provides 94 g of protein and 1959 kcals per day)  RD Assessment:  Estimated Needs Total Energy Estimated Needs: 1800-2000 Total Protein Estimated Needs: 90-100 grams Total Fluid Estimated Needs: >1.8 L/day  Current Nutrition:  NPO  - has NGT  Plan:  KCl 10 mEq IV q1h x 2  Increase  TPN to 80 mL/hr at 1800 Electrolytes in TPN:  Na 154 mEq/L K 40 mEq/L - increased Ca 5 mEq/L Mg 3 mEq/L Phos 15 mmol/L Cl:Ac 1:2 Add standard MVI and trace elements to TPN Continue SSI sensitive q6h and adjust as needed  Thiamine  100 mg daily x5 days (5/28 - 6/1) Continue MIVF at 35 mL/hr Monitor TPN labs on Mon/Thurs at a minimum CMET, phos, Mag daily thru 06/05/23 for now   Thank you for allowing pharmacy to be a part of this patient's care.  Alfredo Inch, PharmD, BCPS Clinical Pharmacist [facility] 06/03/2023 8:29 AM

## 2023-06-03 NOTE — Progress Notes (Addendum)
 PHARMACY - ANTICOAGULATION CONSULT NOTE  Pharmacy Consult for heparin   Indication: atrial fibrillation (PTA Xarelto  on hold)  Allergies  Allergen Reactions   Iodinated Contrast Media Hives and Dermatitis   Linzess [Linaclotide] Other (See Comments)    Caused horrible stomach pain and gas   Atorvastatin  Other (See Comments)    Gas pain in abdomen with no relief   Other Diarrhea and Other (See Comments)    IBS- Special diet   Milk-Related Compounds Diarrhea and Other (See Comments)    Has IBS   Rosuvastatin  Calcium  Other (See Comments)    Worsening GI upset   Simvastatin Other (See Comments)    Myalgia & GI upset   Eluxadoline Nausea And Vomiting and Other (See Comments)    Viberzi - stomach pain   Iohexol  Hives, Rash and Other (See Comments)   Marinol [Dronabinol] Palpitations and Other (See Comments)    "Made me feel high and almost passed out"   Red Dye #40 (Allura Red) Hives and Rash    Red contrast dye     Patient Measurements: Height: 5\' 2"  (157.5 cm) Weight: 73.5 kg (162 lb 0.6 oz) IBW/kg (Calculated) : 50.1 HEPARIN  DW (KG): 67.3  Vital Signs: Temp: 98.1 F (36.7 C) (05/31 0439) Temp Source: Oral (05/31 0439) BP: 120/84 (05/31 0800) Pulse Rate: 78 (05/31 0800)  Labs: Recent Labs    06/01/23 0315 06/01/23 0500 06/02/23 0349 06/02/23 1326 06/02/23 2056 06/03/23 0453  HGB 8.6*  --  9.0*  --   --  8.9*  HCT 28.7*  --  29.3*  --   --  29.6*  PLT 359  --  308  --   --  283  HEPARINUNFRC  --    < > 0.83* 0.67 0.67 0.70  CREATININE 1.00  --  0.95  --   --  0.86   < > = values in this interval not displayed.    Estimated Creatinine Clearance: 59.6 mL/min (by C-G formula based on SCr of 0.86 mg/dL).   Medical History: Past Medical History:  Diagnosis Date   ACS (acute coronary syndrome) (HCC) 01/06/2020   With progressive angina while troponin elevation--borderline-NON-STEMI   ANTERIOR STEMI of LAD (x2). 01/19/1996   Complicated by VF arrest, and anoxic  brain injury with status epilepticus; POBA of LAD --> 6 months later, after 2 vessel CABG she had postop complication with occluded LIMA/second anterior STEMI   CAD in native artery & Grafts 01/1996   a) 1/'98: Ant STEMI => LAD POBA; b) 2/'98: 95% prox LAD @ POBA site --> BMS PCI (3.0 mm x 25 mm); c) 7/98 (5 months later) => BMS ISR of LAD--> CABGx2 (LIMA-LAD, SVG-D1 -> Emergent redo w/ SVG-LAD b/c acute LIMA occlusion); d) 01/2020: ACS -> DES PCI of SVG-LAD (75%&85%), TO of SVG-D1.   COPD (chronic obstructive pulmonary disease) (HCC)    Dyspnea    GERD (gastroesophageal reflux disease)    Headache    Hiatal hernia    Hyperlipidemia    Hypertension    Hypothyroidism    On Synthroid    Iron deficiency anemia    Irritable bowel syndrome    Followed by Dr. Tami Falcon.   Ischemic cardiomyopathy 01/03/2020   ECHO (ACS): EF 45-50% (by Cath EF 40-45%) - Apical Inferior/Apical Anteroseptal & Apex akinetic, GR II DD.    PAF (paroxysmal atrial fibrillation) (HCC) 05/03/2014   New onset - originally with RVR   Pneumonia    x 2   PONV (postoperative nausea  and vomiting)    very gag reflex   S/P CABG x 2; with EMERGENT Redo x 1. 07/1996   Initially LIMA-LAD, SVG-D1 --> immediate LIMA failure post CABG with anterior MI --> urgent redo SVG-LAD beyond D2.; Widely patent grafts as of 2005 with left dominant system. Graft to the diagonal branch has a very small target vessel but the vein graft to LAD retrograde fills a large bifurcating D2 and antegrade fills a large D3.   Tobacco abuse     Medications:  - on Xarelto  20 mg daily PTA  Assessment: Patient is a 68 y.o with hx afib on Xarelto  PTA and was recently diagnosed with colon cancer who presented to [facility] on 05/25/23 for right hemicolectomy.  She underwent lap right hemicolectomy on 05/25/23 with heparin  drip started post-op on 05/28/23.  Today, 06/03/2023: - 05:34 Heparin  level = 0.7, therapeutic with IV heparin  infusing at 850 units/hr - Hgb  low/steady at 8.9, plts WNL - No bleeding or complications of therapy reported by nurse  Goal of Therapy:  Heparin  level 0.3-0.7 units/ml Monitor platelets by anticoagulation protocol: Yes   Plan:  - Continue IV heparin  infusion at 850 units/hr - Monitor daily heparin  level, CBC, signs/symptoms of bleeding   Delpha Fickle, PharmD, BCPS  Clinical Pharmacist 06/03/2023 8:24 AM

## 2023-06-03 NOTE — Progress Notes (Signed)
 Progress Note  Patient Name: Dominique Barber Date of Encounter: 06/03/2023  Primary Cardiologist:   Randene Bustard, MD   Subjective   She feels OK.  She ambulated.  Rate was better controlled.   Inpatient Medications    Scheduled Meds:  Chlorhexidine  Gluconate Cloth  6 each Topical Daily   digoxin   0.125 mg Intravenous Daily   insulin  aspart  0-9 Units Subcutaneous Q6H   mouth rinse  15 mL Mouth Rinse 4 times per day   thiamine  (VITAMIN B1) injection  100 mg Intravenous Daily   Continuous Infusions:  diltiazem  (CARDIZEM ) infusion 7.5 mg/hr (06/03/23 1000)   heparin  850 Units/hr (06/03/23 1000)   potassium chloride  10 mEq (06/03/23 1049)   TPN ADULT (ION) 60 mL/hr at 06/03/23 1000   TPN ADULT (ION)     PRN Meds: albuterol , diphenhydrAMINE  **OR** diphenhydrAMINE , hydrALAZINE , HYDROmorphone  (DILAUDID ) injection, lidocaine , lip balm, menthol-cetylpyridinium, methocarbamol  (ROBAXIN ) injection, metoprolol  tartrate, nitroGLYCERIN , ondansetron  **OR** ondansetron  (ZOFRAN ) IV, mouth rinse, phenol, prochlorperazine , simethicone , sodium chloride  flush, traMADol    Vital Signs    Vitals:   06/03/23 0800 06/03/23 0919 06/03/23 0936 06/03/23 1000  BP: 120/84  127/75 127/78  Pulse: 78 64 79 88  Resp: (!) 21 (!) 21 14 14   Temp:      TempSrc:      SpO2: 95% 94% 96% 96%  Weight:      Height:        Intake/Output Summary (Last 24 hours) at 06/03/2023 1103 Last data filed at 06/03/2023 1000 Gross per 24 hour  Intake 2271.66 ml  Output 1350 ml  Net 921.66 ml   Filed Weights   06/01/23 0500 06/02/23 0513 06/03/23 0500  Weight: 73.7 kg 74.9 kg 73.5 kg    Telemetry    Atrial fib with controlled ventricular rate.   - Personally Reviewed  ECG    NA - Personally Reviewed  Physical Exam   GEN: No acute distress.   Neck: No  JVD Cardiac: Irregular RR, no murmurs, rubs, or gallops.  Respiratory: Clear  to auscultation bilaterally. GI: Soft, nontender, non-distended  MS:     Moderate leg edema; No deformity. Neuro:  Nonfocal  Psych: Normal affect   Labs    Chemistry Recent Labs  Lab 06/01/23 0315 06/02/23 0349 06/03/23 0453  NA 132* 137 136  K 4.9 3.1* 3.5  CL 94* 100 98  CO2 25 32 29  GLUCOSE 661* 120* 146*  BUN 14 16 20   CREATININE 1.00 0.95 0.86  CALCIUM  8.0* 8.1* 8.0*  PROT 5.5* 5.3* 5.7*  ALBUMIN 2.9* 2.8* 2.7*  AST 30 29 31   ALT 37 48* 61*  ALKPHOS 56 54 58  BILITOT 0.4 0.5 0.5  GFRNONAA >60 >60 >60  ANIONGAP 13 5 9      Hematology Recent Labs  Lab 06/01/23 0315 06/02/23 0349 06/03/23 0453  WBC 11.1* 9.8 9.0  RBC 3.12* 3.32* 3.30*  HGB 8.6* 9.0* 8.9*  HCT 28.7* 29.3* 29.6*  MCV 92.0 88.3 89.7  MCH 27.6 27.1 27.0  MCHC 30.0 30.7 30.1  RDW 18.9* 18.6* 18.6*  PLT 359 308 283    Cardiac EnzymesNo results for input(s): "TROPONINI" in the last 168 hours. No results for input(s): "TROPIPOC" in the last 168 hours.   BNPNo results for input(s): "BNP", "PROBNP" in the last 168 hours.   DDimer No results for input(s): "DDIMER" in the last 168 hours.   Radiology    No results found.  Cardiac Studies   Echo 03/2023:  1. Left ventricular ejection fraction, by estimation, is 55 to 60%. The  left ventricle has normal function. The left ventricle has no regional  wall motion abnormalities. Left ventricular diastolic parameters were  normal. Elevated left atrial pressure.  The average left ventricular global longitudinal strain is -17.6 %. The  global longitudinal strain is abnormal.   2. Right ventricular systolic function is normal. The right ventricular  size is normal. Tricuspid regurgitation signal is inadequate for assessing  PA pressure.   3. The mitral valve is normal in structure. Trivial mitral valve  regurgitation. No evidence of mitral stenosis.   4. The aortic valve is tricuspid. Aortic valve regurgitation is not  visualized. No aortic stenosis is present.   5. The inferior vena cava is normal in size with greater  than 50%  respiratory variability, suggesting right atrial pressure of 3 mmHg.   Patient Profile     68 y.o. female  with STEMI 1998 w/ VF arrest & anoxic brain injury w/ status epilepticus, s/p blow-by w/ BMS LAD 1 mo later. ISR>> CABG 1998 w/ LIMA-LAD & SVG-D1, AMI w/ emergent re-operation w/ SVG-LAD 1998, PTCA/DES to mSVG-LAD 01/2020, prior ICM with normalized LVEF by echo 03/2023, PAF, pre-DM, HLD, hypothyroidism, COPD, cecal adenocarcinoma admitted for elective right hemicolectomy. Post-op course notable for acute renal insufficiency, post-operative ileus/sluggish transit requiring NPO status, hypokalemia, hyponatremia, leukocytosis   Assessment & Plan    PAF: She has been NPO.  She has a manage with IV Cardizem  and IV digoxin .  She is on IV heparin .  Heart rate seems to be reasonably controlled.  Continue current therapy today.    For questions or updates, please contact [facility] Please consult [email or web address] for contact info under Cardiology/STEMI.   Signed, Eilleen Grates, MD  06/03/2023, 11:03 AM

## 2023-06-04 ENCOUNTER — Inpatient Hospital Stay (HOSPITAL_COMMUNITY)

## 2023-06-04 DIAGNOSIS — I480 Paroxysmal atrial fibrillation: Secondary | ICD-10-CM | POA: Diagnosis not present

## 2023-06-04 DIAGNOSIS — Z9049 Acquired absence of other specified parts of digestive tract: Secondary | ICD-10-CM | POA: Diagnosis not present

## 2023-06-04 LAB — COMPREHENSIVE METABOLIC PANEL WITH GFR
ALT: 59 U/L — ABNORMAL HIGH (ref 0–44)
AST: 26 U/L (ref 15–41)
Albumin: 2.7 g/dL — ABNORMAL LOW (ref 3.5–5.0)
Alkaline Phosphatase: 65 U/L (ref 38–126)
Anion gap: 10 (ref 5–15)
BUN: 24 mg/dL — ABNORMAL HIGH (ref 8–23)
CO2: 30 mmol/L (ref 22–32)
Calcium: 8.5 mg/dL — ABNORMAL LOW (ref 8.9–10.3)
Chloride: 99 mmol/L (ref 98–111)
Creatinine, Ser: 0.8 mg/dL (ref 0.44–1.00)
GFR, Estimated: >60 mL/min (ref >60)
Glucose, Bld: 151 mg/dL — ABNORMAL HIGH (ref 70–99)
Potassium: 3.9 mmol/L (ref 3.5–5.1)
Sodium: 139 mmol/L (ref 135–145)
Total Bilirubin: 0.4 mg/dL (ref 0.0–1.2)
Total Protein: 5.5 g/dL — ABNORMAL LOW (ref 6.5–8.1)

## 2023-06-04 LAB — MAGNESIUM: Magnesium: 2.1 mg/dL (ref 1.7–2.4)

## 2023-06-04 LAB — GLUCOSE, CAPILLARY
Glucose-Capillary: 171 mg/dL — ABNORMAL HIGH (ref 70–99)
Glucose-Capillary: 157 mg/dL — ABNORMAL HIGH (ref 70–99)
Glucose-Capillary: 143 mg/dL — ABNORMAL HIGH (ref 70–99)

## 2023-06-04 LAB — HEPARIN LEVEL (UNFRACTIONATED): Heparin Unfractionated: 0.4 [IU]/mL (ref 0.30–0.70)

## 2023-06-04 LAB — PHOSPHORUS: Phosphorus: 3.8 mg/dL (ref 2.5–4.6)

## 2023-06-04 MED ORDER — TRAVASOL 10 % IV SOLN
INTRAVENOUS | Status: AC
  Filled 2023-06-04: qty 940.8

## 2023-06-04 NOTE — Progress Notes (Signed)
 10 Days Post-Op   Subjective/Chief Complaint: Two more bms since yesterday   Objective: Vital signs in last 24 hours: Temp:  [98.1 F (36.7 C)-98.5 F (36.9 C)] 98.1 F (36.7 C) (06/01 0426) Pulse Rate:  [64-108] 91 (06/01 0805) Resp:  [14-22] 17 (06/01 0805) BP: (109-150)/(69-97) 145/72 (06/01 0805) SpO2:  [93 %-97 %] 95 % (06/01 0805) Weight:  [73 kg] 73 kg (06/01 0500) Last BM Date : 06/03/23  Intake/Output from previous day: 05/31 0701 - 06/01 0700 In: 2072.9 [P.O.:100; I.V.:1884.2; IV Piggyback:88.7] Out: 725 [Emesis/NG output:725] Intake/Output this shift: No intake/output data recorded.  General nad Pulm effort normal Rate normal Ab mildly distended (although she states she is at her norm), nontender, incision clean  Lab Results:  Recent Labs    06/02/23 0349 06/03/23 0453  WBC 9.8 9.0  HGB 9.0* 8.9*  HCT 29.3* 29.6*  PLT 308 283   BMET Recent Labs    06/02/23 0349 06/03/23 0453  NA 137 136  K 3.1* 3.5  CL 100 98  CO2 32 29  GLUCOSE 120* 146*  BUN 16 20  CREATININE 0.95 0.86  CALCIUM  8.1* 8.0*   PT/INR No results for input(s): "LABPROT", "INR" in the last 72 hours. ABG No results for input(s): "PHART", "HCO3" in the last 72 hours.  Invalid input(s): "PCO2", "PO2"  Studies/Results: DG Abd Portable 1V Result Date: 06/04/2023 CLINICAL DATA:  [id number]. Follow-up for ileus following gastrointestinal surgery. EXAM: PORTABLE ABDOMEN - 1 VIEW COMPARISON:  CT abdomen and pelvis 05/31/2023 FINDINGS: Small-bowel dilatation is not notably changed from 4 days ago. Maximum small bowel caliber is 3.7 cm. A general paucity of colonic gas is again noted but there is a small amount in left-sided colon. NGT terminates in the mid gastric body. There is no supine evidence of free air. Degenerative change and mild levoscoliosis lumbar spine. IMPRESSION: 1. Small-bowel dilatation is not notably changed from 4 days ago. Maximum small bowel caliber is 3.7 cm. 2. NGT  terminates in the mid gastric body. Electronically Signed   By: Denman Fischer M.D.   On: 06/04/2023 07:56    Anti-infectives: Anti-infectives (From admission, onward)    Start     Dose/Rate Route Frequency Ordered Stop   05/25/23 1400  neomycin  (MYCIFRADIN ) tablet 1,000 mg  Status:  Discontinued       Placed in "And" Linked Group   1,000 mg Oral 3 times per day 05/25/23 0835 05/25/23 0840   05/25/23 1400  metroNIDAZOLE  (FLAGYL ) tablet 1,000 mg  Status:  Discontinued       Placed in "And" Linked Group   1,000 mg Oral 3 times per day 05/25/23 0835 05/25/23 0840   05/25/23 0845  cefoTEtan  (CEFOTAN ) 2 g in sodium chloride  0.9 % 100 mL IVPB        2 g 200 mL/hr over 30 Minutes Intravenous On call to O.R. 05/25/23 0835 05/26/23 0809       Assessment/Plan: POD 10 lap right colon -appears ileus may be getting better with bm, exam same, ng with 700 out and not very bilious, xray still with ileus does have some air in colon -will try to clamp ng tube today as discussed with her -will cont current mgt today -continue TPN and heparin  gtt  Enid Harry 06/04/2023

## 2023-06-04 NOTE — Progress Notes (Signed)
 PROGRESS NOTE  Dominique Barber  DOB: 1955/05/26  PCP: Abraham Abo, MD ZHY:[id number]  DOA: 05/25/2023  LOS: 10 days  Hospital Day: 11  Brief narrative: Dominique Barber is a 68 y.o. female with PMH significant for HTN, HLD, CAD with h/o V-fib arrest and subsequent CABG, ischemic cardiomyopathy, paroxysmal A-fib, COPD, not on oxygen at home. 03/17/2023, patient underwent colonoscopy by Dr. Nickey Barn for iron deficiency anemia.  She was found to have tumor in the ascending colon, biopsy showed moderately differentiated invasive adenocarcinoma. She was seen by general surgery and as planned underwent elective right hemicolectomy on 5/22. Hospitalist service was consulted for postoperative medical management. 5/22, cardiology service was also consulted for A-fib with RVR  Subjective: Patient was seen and examined this morning.  Propped up in bed. Continues to have NG tube attached to suction with greenish drainage Reports 2 more bowel movement yesterday.  Abdomen still distended however Remains on IV heparin  drip and IV Cardizem  drip  Assessment and plan: Colon cancer S/p right hemicolectomy 5/22 Dr. Camilo Cella Post-operative ileus Currently remains in postoperative ileus.  Continues to have NG tube attached to suction with greenish drainage Reports 2 more bowel movement yesterday.  Abdomen still distended however X-ray this morning still with ileus. Noted a plan from general surgery to clamp NG tube today Continue TPN Pain management with tramadol  as needed and Dilaudid  as needed Monitor electrolytes  Hypokalemia Potassium level improved with replacement Recent Labs  Lab 05/29/23 0458 05/30/23 0426 05/31/23 0504 06/01/23 0315 06/02/23 0349 06/03/23 0453  K 4.6 3.5 4.1 4.9 3.1* 3.5  MG 2.5*  --  2.2 2.5* 2.3 2.1  PHOS  --   --  2.9 3.9 2.6 3.3   A-fib with RVR  H/o paroxysmal A-fib 5/25, patient went to RVR.   Cardiology following.   Currently on Cardizem  drip,  digoxin  For anticoag, currently on heparin  drip.  Plan to switch to Xarelto  when oral intake is insured  CAD/CABG Currently does not have any anginal symptoms.   PTA meds-Xarelto , [id number] as an outpatient Xarelto  plan as above  Hyperglycemia A1c 6.3 on 05/27/2023 Sugar level elevated with TPN. Continue SSI/Accu-Cheks Recent Labs  Lab 06/03/23 0534 06/03/23 1208 06/03/23 1759 06/03/23 2357 06/04/23 0601  GLUCAP 159* 129* 143* 126* 171*    Hypothyroidism To resume levothyroxine  when able to take p.o.  Acute urinary retention Foley catheter was removed on 5/30.  Successful voiding trial.     Mobility: Encourage ambulation.  Uses a walker.  Ambulates daily.  Goals of care   Code Status: Full Code     DVT prophylaxis:  SCD's Start: 05/25/23 1501 Place TED hose Start: 05/25/23 1501   Antimicrobials: None Fluid: TPN Family Communication: None at bedside  Status: Inpatient Level of care:  Progressive   Patient is from: Home Needs to continue in-hospital care: Postop ileus seems to be improving Anticipated d/c to: Hopefully home after clinical improvement in next 1 to 2 days   Diet:  Diet Order             Diet NPO time specified Except for: Ice Chips, Sips with Meds  Diet effective now                   Scheduled Meds:  Chlorhexidine  Gluconate Cloth  6 each Topical Daily   digoxin   0.125 mg Intravenous Daily   insulin  aspart  0-9 Units Subcutaneous Q6H   mouth rinse  15 mL Mouth Rinse 4 times per day  PRN meds: albuterol , diphenhydrAMINE  **OR** diphenhydrAMINE , hydrALAZINE , HYDROmorphone  (DILAUDID ) injection, lidocaine , lip balm, menthol-cetylpyridinium, methocarbamol  (ROBAXIN ) injection, metoprolol  tartrate, nitroGLYCERIN , ondansetron  **OR** ondansetron  (ZOFRAN ) IV, mouth rinse, phenol, prochlorperazine , simethicone , sodium chloride  flush, traMADol    Infusions:   diltiazem  (CARDIZEM ) infusion 7.5 mg/hr (06/04/23 1000)   heparin  850 Units/hr  (06/04/23 0218)   TPN ADULT (ION) 80 mL/hr at 06/03/23 1856    Antimicrobials: Anti-infectives (From admission, onward)    Start     Dose/Rate Route Frequency Ordered Stop   05/25/23 1400  neomycin  (MYCIFRADIN ) tablet 1,000 mg  Status:  Discontinued       Placed in "And" Linked Group   1,000 mg Oral 3 times per day 05/25/23 0835 05/25/23 0840   05/25/23 1400  metroNIDAZOLE  (FLAGYL ) tablet 1,000 mg  Status:  Discontinued       Placed in "And" Linked Group   1,000 mg Oral 3 times per day 05/25/23 0835 05/25/23 0840   05/25/23 0845  cefoTEtan  (CEFOTAN ) 2 g in sodium chloride  0.9 % 100 mL IVPB        2 g 200 mL/hr over 30 Minutes Intravenous On call to O.R. 05/25/23 0835 05/26/23 0809       Objective: Vitals:   06/04/23 0958 06/04/23 1042  BP: (!) 142/77   Pulse: (!) 107 88  Resp: (!) 21 14  Temp:    SpO2: 96% 92%    Intake/Output Summary (Last 24 hours) at 06/04/2023 1051 Last data filed at 06/04/2023 0608 Gross per 24 hour  Intake 1708.87 ml  Output 725 ml  Net 983.87 ml   Filed Weights   06/02/23 0513 06/03/23 0500 06/04/23 0500  Weight: 74.9 kg 73.5 kg 73 kg   Weight change: -0.5 kg Body mass index is 29.44 kg/m.   Physical Exam: General exam: Pleasant, elderly Caucasian female.  Not in pain Skin: No rashes, lesions or ulcers. HEENT: Atraumatic, normocephalic, no obvious bleeding.  Has NG tube attached to suction as of this morning Lungs: Diminished entry in both bases.  Otherwise clear to auscultation bilaterally.  Not on supplemental oxygen CVS: S1, S2, no murmur,   GI/Abd: Soft, postop tenderness present, distention still present.  Tympanic on percussion, bowel sound present,   CNS: Alert, awake, oriented x 3 Psychiatry: Mood appropriate Extremities: Trace bilateral pedal edema, no calf tenderness,   Data Review: I have personally reviewed the laboratory data and studies available.  F/u labs ordered Unresulted Labs (From admission, onward)     Start      Ordered   06/05/23 0500  Triglycerides  (Standard TPN Labs (all labs to be drawn at 0500))  Every Monday (0500),   R     Question:  Specimen collection method  Answer:  Lab=Lab collect   05/31/23 0647   06/04/23 0522  Comprehensive metabolic panel with GFR  Once,   R        06/04/23 0522   06/04/23 0522  Magnesium  Once,   R        06/04/23 0522   06/04/23 0522  Phosphorus  Once,   R        06/04/23 0522   06/03/23 0500  Heparin  level (unfractionated)  Daily,   R     Question:  Specimen collection method  Answer:  IV Team=IV Team collect   06/01/23 1827   06/01/23 0500  Comprehensive metabolic panel  (Standard TPN Labs (all labs to be drawn at 0500))  Every Mon,Thu (0500),   R  Question:  Specimen collection method  Answer:  Lab=Lab collect   05/31/23 0647   06/01/23 0500  Magnesium  (Standard TPN Labs (all labs to be drawn at 0500))  Every Mon,Thu (0500),   R     Question:  Specimen collection method  Answer:  Lab=Lab collect   05/31/23 0647   06/01/23 0500  Phosphorus  (Standard TPN Labs (all labs to be drawn at 0500))  Every Mon,Thu (0500),   R     Question:  Specimen collection method  Answer:  Lab=Lab collect   05/31/23 0647            Signed, Hoyt Macleod, MD [facility] 06/04/2023

## 2023-06-04 NOTE — Progress Notes (Signed)
 Progress Note  Patient Name: Dominique Barber Date of Encounter: 06/04/2023  Primary Cardiologist:   Randene Bustard, MD   Subjective   Still with abdominal discomfort that has been controlled.  Ambulated in the hallway.    Inpatient Medications    Scheduled Meds:  Chlorhexidine  Gluconate Cloth  6 each Topical Daily   digoxin   0.125 mg Intravenous Daily   insulin  aspart  0-9 Units Subcutaneous Q6H   mouth rinse  15 mL Mouth Rinse 4 times per day   thiamine  (VITAMIN B1) injection  100 mg Intravenous Daily   Continuous Infusions:  diltiazem  (CARDIZEM ) infusion 7.5 mg/hr (06/03/23 1856)   heparin  850 Units/hr (06/04/23 0218)   TPN ADULT (ION) 80 mL/hr at 06/03/23 1856   PRN Meds: albuterol , diphenhydrAMINE  **OR** diphenhydrAMINE , hydrALAZINE , HYDROmorphone  (DILAUDID ) injection, lidocaine , lip balm, menthol-cetylpyridinium, methocarbamol  (ROBAXIN ) injection, metoprolol  tartrate, nitroGLYCERIN , ondansetron  **OR** ondansetron  (ZOFRAN ) IV, mouth rinse, phenol, prochlorperazine , simethicone , sodium chloride  flush, traMADol    Vital Signs    Vitals:   06/04/23 0400 06/04/23 0426 06/04/23 0500 06/04/23 0805  BP: (!) 148/91   (!) 145/72  Pulse: (!) 108 90  91  Resp: 17 17  17   Temp:  98.1 F (36.7 C)    TempSrc:  Oral    SpO2: 94% 93%  95%  Weight:   73 kg   Height:        Intake/Output Summary (Last 24 hours) at 06/04/2023 0923 Last data filed at 06/04/2023 1610 Gross per 24 hour  Intake 1948.68 ml  Output 725 ml  Net 1223.68 ml   Filed Weights   06/02/23 0513 06/03/23 0500 06/04/23 0500  Weight: 74.9 kg 73.5 kg 73 kg    Telemetry    Atrial fib with rate increased with ambulation.  .   - Personally Reviewed  ECG    NA - Personally Reviewed  Physical Exam   GEN: No  acute distress.   Neck: No  JVD Cardiac: Irregular RR, no murmurs, rubs, or gallops.  Respiratory: Clear   to auscultation bilaterally. GI:    Distended with absent bowel sounds MS:  Mild leg  edema; No deformity. Neuro:   Nonfocal  Psych: Oriented and appropriate    Labs    Chemistry Recent Labs  Lab 06/01/23 0315 06/02/23 0349 06/03/23 0453  NA 132* 137 136  K 4.9 3.1* 3.5  CL 94* 100 98  CO2 25 32 29  GLUCOSE 661* 120* 146*  BUN 14 16 20   CREATININE 1.00 0.95 0.86  CALCIUM  8.0* 8.1* 8.0*  PROT 5.5* 5.3* 5.7*  ALBUMIN 2.9* 2.8* 2.7*  AST 30 29 31   ALT 37 48* 61*  ALKPHOS 56 54 58  BILITOT 0.4 0.5 0.5  GFRNONAA >60 >60 >60  ANIONGAP 13 5 9      Hematology Recent Labs  Lab 06/01/23 0315 06/02/23 0349 06/03/23 0453  WBC 11.1* 9.8 9.0  RBC 3.12* 3.32* 3.30*  HGB 8.6* 9.0* 8.9*  HCT 28.7* 29.3* 29.6*  MCV 92.0 88.3 89.7  MCH 27.6 27.1 27.0  MCHC 30.0 30.7 30.1  RDW 18.9* 18.6* 18.6*  PLT 359 308 283    Cardiac EnzymesNo results for input(s): "TROPONINI" in the last 168 hours. No results for input(s): "TROPIPOC" in the last 168 hours.   BNPNo results for input(s): "BNP", "PROBNP" in the last 168 hours.   DDimer No results for input(s): "DDIMER" in the last 168 hours.   Radiology    DG Abd Portable 1V Result Date: 06/04/2023  CLINICAL DATA:  [id number]. Follow-up for ileus following gastrointestinal surgery. EXAM: PORTABLE ABDOMEN - 1 VIEW COMPARISON:  CT abdomen and pelvis 05/31/2023 FINDINGS: Small-bowel dilatation is not notably changed from 4 days ago. Maximum small bowel caliber is 3.7 cm. A general paucity of colonic gas is again noted but there is a small amount in left-sided colon. NGT terminates in the mid gastric body. There is no supine evidence of free air. Degenerative change and mild levoscoliosis lumbar spine. IMPRESSION: 1. Small-bowel dilatation is not notably changed from 4 days ago. Maximum small bowel caliber is 3.7 cm. 2. NGT terminates in the mid gastric body. Electronically Signed   By: Denman Fischer M.D.   On: 06/04/2023 07:56    Cardiac Studies   Echo 03/2023:  1. Left ventricular ejection fraction, by estimation, is 55 to 60%.  The  left ventricle has normal function. The left ventricle has no regional  wall motion abnormalities. Left ventricular diastolic parameters were  normal. Elevated left atrial pressure.  The average left ventricular global longitudinal strain is -17.6 %. The  global longitudinal strain is abnormal.   2. Right ventricular systolic function is normal. The right ventricular  size is normal. Tricuspid regurgitation signal is inadequate for assessing  PA pressure.   3. The mitral valve is normal in structure. Trivial mitral valve  regurgitation. No evidence of mitral stenosis.   4. The aortic valve is tricuspid. Aortic valve regurgitation is not  visualized. No aortic stenosis is present.   5. The inferior vena cava is normal in size with greater than 50%  respiratory variability, suggesting right atrial pressure of 3 mmHg.   Patient Profile     68 y.o. female  with STEMI 1998 w/ VF arrest & anoxic brain injury w/ status epilepticus, s/p blow-by w/ BMS LAD 1 mo later. ISR>> CABG 1998 w/ LIMA-LAD & SVG-D1, AMI w/ emergent re-operation w/ SVG-LAD 1998, PTCA/DES to mSVG-LAD 01/2020, prior ICM with normalized LVEF by echo 03/2023, PAF, pre-DM, HLD, hypothyroidism, COPD, cecal adenocarcinoma admitted for elective right hemicolectomy. Post-op course notable for acute renal insufficiency, post-operative ileus/sluggish transit requiring NPO status, hypokalemia, hyponatremia, leukocytosis   Assessment & Plan    PAF: She has been NPO.  She has a manage with IV Cardizem  and IV digoxin .  She is on IV heparin .  Continue current therapy.  Rate is reasonably controlled for the clinical scenario.    For questions or updates, please contact [facility] Please consult [email or web address] for contact info under Cardiology/STEMI.   Signed, Eilleen Grates, MD  06/04/2023, 9:23 AM

## 2023-06-04 NOTE — Progress Notes (Addendum)
 Physical Therapy Treatment Patient Details Name: Dominique Barber MRN: [id number] DOB: 1955-02-06 Today's Date: 06/04/2023   History of Present Illness 68 yo female S/P laparoscopic right COLECTOMY and LYSIS of ADHESIONS on 5/22. 6/1: remain with NG tube however clamped today; Remains on IV heparin  drip and IV Cardizem  drip - cardiology on board. PMH:CAD, CABG,PAF,ACS,COPD,IBS, GERD, Hypothyroid, HH, HTN.    PT Comments  Pt agreeable to ambulate and able to perform 400 ft with RW.  Pt has been NPO and cardiology on board managing PAF, currently still requiring IV heparin  drip and IV Cardizem  drip, however per cardiology notes, "rates are expected to go up with ambulation - she is OK to walk the halls with assistance as long as she is asymptomatic from Afib."  HR mostly 130-140s bpm during ambulation but elevated to 150 bpm briefly -  pt asymptomatic throughout.    If plan is discharge home, recommend the following: Assistance with cooking/housework;Assist for transportation;Help with stairs or ramp for entrance;A little help with bathing/dressing/bathroom   Can travel by private vehicle        Equipment Recommendations  None recommended by PT    Recommendations for Other Services       Precautions / Restrictions Precautions Precautions: Fall Precaution/Restrictions Comments: abdomen, NG tube, monitor HR     Mobility  Bed Mobility               General bed mobility comments: pt in recliner    Transfers Overall transfer level: Needs assistance Equipment used: Rolling walker (2 wheels) Transfers: Sit to/from Stand Sit to Stand: Contact guard assist           General transfer comment: verbal cues for hand placement    Ambulation/Gait Ambulation/Gait assistance: Contact guard assist Gait Distance (Feet): 400 Feet Assistive device: Rolling walker (2 wheels) Gait Pattern/deviations: Step-through pattern, Decreased stride length       General Gait Details: verbal  cues for RW positioning and posture as tolerated; HR elevated to 150 bpm briefly however mostly 130-140s during ambulation; pt asymptomatic   Stairs             Wheelchair Mobility     Tilt Bed    Modified Rankin (Stroke Patients Only)       Balance                                            Communication Communication Communication: No apparent difficulties  Cognition Arousal: Alert Behavior During Therapy: WFL for tasks assessed/performed   PT - Cognitive impairments: No apparent impairments                         Following commands: Intact      Cueing Cueing Techniques: Verbal cues  Exercises      General Comments        Pertinent Vitals/Pain Pain Assessment Pain Assessment: No/denies pain Pain Intervention(s): Monitored during session, Repositioned    Home Living                          Prior Function            PT Goals (current goals can now be found in the care plan section) Progress towards PT goals: Progressing toward goals    Frequency    Min 2X/week  PT Plan      Co-evaluation              AM-PAC PT "6 Clicks" Mobility   Outcome Measure  Help needed turning from your back to your side while in a flat bed without using bedrails?: None Help needed moving from lying on your back to sitting on the side of a flat bed without using bedrails?: A Little Help needed moving to and from a bed to a chair (including a wheelchair)?: A Little Help needed standing up from a chair using your arms (e.g., wheelchair or bedside chair)?: A Little Help needed to walk in hospital room?: A Little Help needed climbing 3-5 steps with a railing? : A Little 6 Click Score: 19    End of Session Equipment Utilized During Treatment: Gait belt Activity Tolerance: Patient tolerated treatment well Patient left: in chair;with call bell/phone within reach;with chair alarm set Nurse Communication: Mobility  status PT Visit Diagnosis: Difficulty in walking, not elsewhere classified (R26.2)     Time: 1337-1350 PT Time Calculation (min) (ACUTE ONLY): 13 min  Charges:    $Gait Training: 8-22 mins PT General Charges $$ ACUTE PT VISIT: 1 Visit                     Blanch Bunde, DPT Physical Therapist Acute Rehabilitation Services Office: [phone]  Myna Asal Payson 06/04/2023, 3:51 PM

## 2023-06-04 NOTE — Progress Notes (Signed)
   06/04/23 2111  Assess: MEWS Score  BP (!) 132/99  MAP (mmHg) 111  Pulse Rate (!) 133  ECG Heart Rate (!) 121  Resp (!) 22  SpO2 92 %  Assess: MEWS Score  MEWS Temp 0  MEWS Systolic 0  MEWS Pulse 2  MEWS RR 1  MEWS LOC 0  MEWS Score 3  MEWS Score Color Yellow  Assess: if the MEWS score is Yellow or Red  Were vital signs accurate and taken at a resting state? No, vital signs rechecked (pt transfering from chair to [facility])  Notify: Charge Nurse/RN  Name of Charge Nurse/RN Notified Lauren RN (no escalation needed)  Assess: SIRS CRITERIA  SIRS Temperature  0  SIRS Respirations  1  SIRS Pulse 1  SIRS WBC 0  SIRS Score Sum  2   Patient has afib and was transferring from chair to [facility], vitals signs not taken at resting state. Once patient finished using bathroom, transferred from [facility] to bed, and resting resp and HR went within normal range. [name], Charge RN notified. MEWS protocol was not initiated.

## 2023-06-04 NOTE — Progress Notes (Signed)
 PHARMACY - ANTICOAGULATION CONSULT NOTE  Pharmacy Consult for heparin   Indication: atrial fibrillation (PTA Xarelto  on hold)  Allergies  Allergen Reactions   Iodinated Contrast Media Hives and Dermatitis   Linzess [Linaclotide] Other (See Comments)    Caused horrible stomach pain and gas   Atorvastatin  Other (See Comments)    Gas pain in abdomen with no relief   Other Diarrhea and Other (See Comments)    IBS- Special diet   Milk-Related Compounds Diarrhea and Other (See Comments)    Has IBS   Rosuvastatin  Calcium  Other (See Comments)    Worsening GI upset   Simvastatin Other (See Comments)    Myalgia & GI upset   Eluxadoline Nausea And Vomiting and Other (See Comments)    Viberzi - stomach pain   Iohexol  Hives, Rash and Other (See Comments)   Marinol [Dronabinol] Palpitations and Other (See Comments)    "Made me feel high and almost passed out"   Red Dye #40 (Allura Red) Hives and Rash    Red contrast dye     Patient Measurements: Height: 5\' 2"  (157.5 cm) Weight: 73 kg (160 lb 15 oz) IBW/kg (Calculated) : 50.1 HEPARIN  DW (KG): 67.3  Vital Signs: Temp: 98.1 F (36.7 C) (06/01 0426) Temp Source: Oral (06/01 0426) BP: 145/72 (06/01 0805) Pulse Rate: 91 (06/01 0805)  Labs: Recent Labs    06/02/23 0349 06/02/23 1326 06/02/23 2056 06/03/23 0453 06/04/23 0240  HGB 9.0*  --   --  8.9*  --   HCT 29.3*  --   --  29.6*  --   PLT 308  --   --  283  --   HEPARINUNFRC 0.83*   < > 0.67 0.70 0.40  CREATININE 0.95  --   --  0.86  --    < > = values in this interval not displayed.    Estimated Creatinine Clearance: 59.4 mL/min (by C-G formula based on SCr of 0.86 mg/dL).   Medical History: Past Medical History:  Diagnosis Date   ACS (acute coronary syndrome) (HCC) 01/06/2020   With progressive angina while troponin elevation--borderline-NON-STEMI   ANTERIOR STEMI of LAD (x2). 01/19/1996   Complicated by VF arrest, and anoxic brain injury with status epilepticus; POBA  of LAD --> 6 months later, after 2 vessel CABG she had postop complication with occluded LIMA/second anterior STEMI   CAD in native artery & Grafts 01/1996   a) 1/'98: Ant STEMI => LAD POBA; b) 2/'98: 95% prox LAD @ POBA site --> BMS PCI (3.0 mm x 25 mm); c) 7/98 (5 months later) => BMS ISR of LAD--> CABGx2 (LIMA-LAD, SVG-D1 -> Emergent redo w/ SVG-LAD b/c acute LIMA occlusion); d) 01/2020: ACS -> DES PCI of SVG-LAD (75%&85%), TO of SVG-D1.   COPD (chronic obstructive pulmonary disease) (HCC)    Dyspnea    GERD (gastroesophageal reflux disease)    Headache    Hiatal hernia    Hyperlipidemia    Hypertension    Hypothyroidism    On Synthroid    Iron deficiency anemia    Irritable bowel syndrome    Followed by Dr. Tami Falcon.   Ischemic cardiomyopathy 01/03/2020   ECHO (ACS): EF 45-50% (by Cath EF 40-45%) - Apical Inferior/Apical Anteroseptal & Apex akinetic, GR II DD.    PAF (paroxysmal atrial fibrillation) (HCC) 05/03/2014   New onset - originally with RVR   Pneumonia    x 2   PONV (postoperative nausea and vomiting)    very gag reflex  S/P CABG x 2; with EMERGENT Redo x 1. 07/1996   Initially LIMA-LAD, SVG-D1 --> immediate LIMA failure post CABG with anterior MI --> urgent redo SVG-LAD beyond D2.; Widely patent grafts as of 2005 with left dominant system. Graft to the diagonal branch has a very small target vessel but the vein graft to LAD retrograde fills a large bifurcating D2 and antegrade fills a large D3.   Tobacco abuse     Medications:  - on Xarelto  20 mg daily PTA  Assessment: Patient is a 68 y.o with hx afib on Xarelto  PTA and was recently diagnosed with colon cancer who presented to [facility] on 05/25/23 for right hemicolectomy.  She underwent lap right hemicolectomy on 05/25/23 with heparin  drip started post-op on 05/28/23.  Today, 06/04/2023: - 02:40 Heparin  level = 0.4, therapeutic with IV heparin  infusing at 850 units/hr - Hgb low/steady at 8.9, plts WNL - No bleeding or  complications of therapy reported by nurse  Goal of Therapy:  Heparin  level 0.3-0.7 units/ml Monitor platelets by anticoagulation protocol: Yes   Plan:  - Continue IV heparin  infusion at 850 units/hr - Monitor daily heparin  level, CBC, signs/symptoms of bleeding   Delpha Fickle, PharmD, BCPS  Clinical Pharmacist 06/04/2023 8:25 AM

## 2023-06-04 NOTE — Progress Notes (Addendum)
 PHARMACY - TOTAL PARENTERAL NUTRITION CONSULT NOTE   Indication: Prolonged ileus  Patient Measurements: Height: 5\' 2"  (157.5 cm) Weight: 73 kg (160 lb 15 oz) IBW/kg (Calculated) : 50.1 TPN AdjBW (KG): 57.1 Body mass index is 29.44 kg/m. Usual Weight:   Assessment: Patient is a 68 y.o F with a recent diagnosis of colon cancer who presented to [facility] on 05/25/23 for right hemicolectomy. Pharmacy has been consulted on 05/31/23 to start TPN for ileus.  Glucose / Insulin : A1c 6.3; no hx DM - CBGs (goal <150): 129-171 - Past 24 hr insulin  aspart usage: 5 units Electrolytes:  6/1 - No labs available although ordered and IV team consult entered - goal for ileus: Mag >2, K >4  Renal: Scr 0.86 (crcl~59), BUN wnl Hepatic: Alk phos WNL, AST/ALT WNL, Total Protein elevated at 5.5, albumin low at 2.9, Total Bilirubin WNL at 0.4 Intake / Output; MIVF: no current MIVF - I/O: + 1348 mL in 24 hrs - UOP: 1x, amt and other occurrences not charted - NG/GT: 725 mL - last BM: 06/03/23 GI Imaging: - 5/24 and Xray: Mildly dilated loops of small bowel.This is nonspecific and could represent ileus or partial small bowel obstruction. - 5/26 abd Xray: Persistent small bowel dilatation  - 5/28 abd CT: Possible small-bowel obstruction with transition point in the right anterior abdomen. Primary differential consideration is enteritis with ileus as there is wall thickening and mucosal hyperenhancement of the small bowel immediately downstream from the transition point. GI Surgeries / Procedures:  - 5/22: Laparoscopic right hemicolectomy   Central access: pending PICC placement  TPN start date: 5/28- pending PICC placement   Nutritional Goals: Goal TPN rate is 80 mL/hr (provides 94 g of protein and 1959 kcals per day)  RD Assessment:  Estimated Needs Total Energy Estimated Needs: 1800-2000 Total Protein Estimated Needs: 90-100 grams Total Fluid Estimated Needs: >1.8 L/day  Current Nutrition:  NPO  -  has NGT  Plan:  Electrolyte supplementation if needed if/when CMET/phos/magnesium obtained Continue TPN at 80 mL/hr at 1800 Electrolytes in TPN:  Na 154 mEq/L K 40 mEq/L Ca 5 mEq/L Mg 3 mEq/L Phos 15 mmol/L Cl:Ac 1:2 Add standard MVI and trace elements to TPN Continue SSI sensitive q6h and adjust as needed  Thiamine  100 mg daily x5 days (5/28 - 6/1) Monitor TPN labs on Mon/Thurs at a minimum CMET, phos, Mag daily thru 06/05/23 for now   Thank you for allowing pharmacy to be a part of this patient's care.  Alfredo Inch, PharmD, BCPS Clinical Pharmacist [place] 06/04/2023 11:42 AM    Addendum: 6/1 Electrolytes WNL  Plan: No additional electrolyte supplementation needed  Thank you for allowing pharmacy to be a part of this patient's care.  Alfredo Inch, PharmD, BCPS Clinical Pharmacist [place] 06/04/2023 2:23 PM'

## 2023-06-04 NOTE — Progress Notes (Signed)
 NGT clamped at 1020 per order. So far, pt has no complaints of N/V or abdominal pain. Will continue to monitor.

## 2023-06-05 ENCOUNTER — Encounter: Payer: Self-pay | Admitting: Endocrinology

## 2023-06-05 DIAGNOSIS — Z9049 Acquired absence of other specified parts of digestive tract: Secondary | ICD-10-CM | POA: Diagnosis not present

## 2023-06-05 LAB — COMPREHENSIVE METABOLIC PANEL WITH GFR
ALT: 56 U/L — ABNORMAL HIGH (ref 0–44)
AST: 27 U/L (ref 15–41)
Albumin: 2.6 g/dL — ABNORMAL LOW (ref 3.5–5.0)
Alkaline Phosphatase: 70 U/L (ref 38–126)
Anion gap: 9 (ref 5–15)
BUN: 25 mg/dL — ABNORMAL HIGH (ref 8–23)
CO2: 26 mmol/L (ref 22–32)
Calcium: 8.1 mg/dL — ABNORMAL LOW (ref 8.9–10.3)
Chloride: 101 mmol/L (ref 98–111)
Creatinine, Ser: 1.06 mg/dL — ABNORMAL HIGH (ref 0.44–1.00)
GFR, Estimated: 58 mL/min — ABNORMAL LOW (ref >60)
Glucose, Bld: 140 mg/dL — ABNORMAL HIGH (ref 70–99)
Potassium: 3.6 mmol/L (ref 3.5–5.1)
Sodium: 136 mmol/L (ref 135–145)
Total Bilirubin: 0.5 mg/dL (ref 0.0–1.2)
Total Protein: 5.3 g/dL — ABNORMAL LOW (ref 6.5–8.1)

## 2023-06-05 LAB — CBC
HCT: 28 % — ABNORMAL LOW (ref 36.0–46.0)
Hemoglobin: 8.3 g/dL — ABNORMAL LOW (ref 12.0–15.0)
MCH: 26.9 pg (ref 26.0–34.0)
MCHC: 29.6 g/dL — ABNORMAL LOW (ref 30.0–36.0)
MCV: 90.9 fL (ref 80.0–100.0)
Platelets: 270 10*3/uL (ref 150–400)
RBC: 3.08 MIL/uL — ABNORMAL LOW (ref 3.87–5.11)
RDW: 18.6 % — ABNORMAL HIGH (ref 11.5–15.5)
WBC: 14.5 10*3/uL — ABNORMAL HIGH (ref 4.0–10.5)
nRBC: 0.2 % (ref 0.0–0.2)

## 2023-06-05 LAB — GLUCOSE, CAPILLARY
Glucose-Capillary: 146 mg/dL — ABNORMAL HIGH (ref 70–99)
Glucose-Capillary: 154 mg/dL — ABNORMAL HIGH (ref 70–99)
Glucose-Capillary: 159 mg/dL — ABNORMAL HIGH (ref 70–99)
Glucose-Capillary: 163 mg/dL — ABNORMAL HIGH (ref 70–99)
Glucose-Capillary: 168 mg/dL — ABNORMAL HIGH (ref 70–99)

## 2023-06-05 LAB — PHOSPHORUS: Phosphorus: 3.7 mg/dL (ref 2.5–4.6)

## 2023-06-05 LAB — HEPARIN LEVEL (UNFRACTIONATED): Heparin Unfractionated: 0.4 [IU]/mL (ref 0.30–0.70)

## 2023-06-05 LAB — TRIGLYCERIDES: Triglycerides: 180 mg/dL — ABNORMAL HIGH (ref <150)

## 2023-06-05 LAB — MAGNESIUM: Magnesium: 2.1 mg/dL (ref 1.7–2.4)

## 2023-06-05 MED ORDER — ORAL CARE MOUTH RINSE: 15.0000 mL | OROMUCOSAL | Status: DC | PRN

## 2023-06-05 MED ORDER — ACETAMINOPHEN 500 MG PO TABS
1000.0000 mg | ORAL_TABLET | Freq: Four times a day (QID) | ORAL | Status: DC
  Administered 2023-06-05 – 2023-06-08 (×11): 1000 mg via ORAL
  Filled 2023-06-05 (×11): qty 2

## 2023-06-05 MED ORDER — POTASSIUM CHLORIDE 20 MEQ PO PACK
20.0000 meq | PACK | Freq: Once | ORAL | Status: AC
  Administered 2023-06-05: 20 meq via ORAL
  Filled 2023-06-05: qty 1

## 2023-06-05 MED ORDER — INSULIN ASPART 100 UNIT/ML IJ SOLN
0.0000 [IU] | Freq: Four times a day (QID) | INTRAMUSCULAR | Status: DC
  Administered 2023-06-05 (×2): 2 [IU] via SUBCUTANEOUS
  Administered 2023-06-06 (×2): 1 [IU] via SUBCUTANEOUS
  Administered 2023-06-06: 2 [IU] via SUBCUTANEOUS
  Administered 2023-06-07: 1 [IU] via SUBCUTANEOUS
  Administered 2023-06-07: 2 [IU] via SUBCUTANEOUS

## 2023-06-05 MED ORDER — POTASSIUM CHLORIDE 10 MEQ/50ML IV SOLN
10.0000 meq | INTRAVENOUS | Status: AC
  Filled 2023-06-05 (×2): qty 50

## 2023-06-05 MED ORDER — TRAVASOL 10 % IV SOLN
INTRAVENOUS | Status: AC
  Filled 2023-06-05: qty 940.8

## 2023-06-05 MED ORDER — LEVOTHYROXINE SODIUM 75 MCG PO TABS
75.0000 ug | ORAL_TABLET | Freq: Every day | ORAL | Status: DC
  Administered 2023-06-05 – 2023-06-08 (×4): 75 ug via ORAL
  Filled 2023-06-05 (×4): qty 1

## 2023-06-05 NOTE — Plan of Care (Signed)
  Problem: Activity: Goal: Ability to tolerate increased activity will improve Outcome: Progressing   Problem: Bowel/Gastric: Goal: Gastrointestinal status for postoperative course will improve Outcome: Progressing   Problem: Skin Integrity: Goal: Will show signs of wound healing Outcome: Progressing   Problem: Coping: Goal: Level of anxiety will decrease Outcome: Progressing   Problem: Pain Managment: Goal: General experience of comfort will improve and/or be controlled Outcome: Progressing   Problem: Skin Integrity: Goal: Risk for impaired skin integrity will decrease Outcome: Progressing

## 2023-06-05 NOTE — Progress Notes (Signed)
 PROGRESS NOTE  Dominique Barber  DOB: 12/16/1955  PCP: Abraham Abo, MD VWU:[id number]  DOA: 05/25/2023  LOS: 11 days  Hospital Day: 12  Brief narrative: Dominique Barber is a 68 y.o. female with PMH significant for HTN, HLD, CAD with h/o V-fib arrest and subsequent CABG, ischemic cardiomyopathy, paroxysmal A-fib, COPD, not on oxygen at home. 03/17/2023, patient underwent colonoscopy by Dr. Nickey Barn for iron deficiency anemia.  She was found to have tumor in the ascending colon, biopsy showed moderately differentiated invasive adenocarcinoma. She was seen by general surgery and as planned underwent elective right hemicolectomy on 5/22. Hospitalist service was consulted for postoperative medical management. 5/22, cardiology service was also consulted for A-fib with RVR  Subjective: Patient was seen and examined this morning.  Propped up in bed.   Had multiple bowel movements over the weekend.  She says she passed a lot of gas this morning.   NGT was removed earlier.  Feels better.  Has sore throat due to prolonged NG tube Full liquid diet started by surgery Being transitioned from IV to oral meds  Assessment and plan: Colon cancer S/p right hemicolectomy 5/22 Dr. Camilo Cella Post-operative ileus Postop ileus seems to be finally improving. NG tube out.  Passed a lot of gas and stool On full liquid diet this morning Per surgery, to continue TPN until reliably tolerating solid food Encourage relation Continue to monitor electrolytes Recent Labs  Lab 06/01/23 0315 06/02/23 0349 06/03/23 0453 06/04/23 1103 06/05/23 0219  K 4.9 3.1* 3.5 3.9 3.6  MG 2.5* 2.3 2.1 2.1 2.1  PHOS 3.9 2.6 3.3 3.8 3.7   A-fib with RVR  H/o paroxysmal A-fib 5/25, patient went to RVR.   Cardiology following.   Currently on Cardizem  drip, digoxin .  Noted a plan to transition to oral For anticoag, currently on heparin  drip.  Noted a plan to switch to Xarelto  when oral if okay with  surgery  CAD/CABG Currently does not have any anginal symptoms.   PTA meds-Xarelto , [id number] as an outpatient Xarelto  plan as above  Hyperglycemia A1c 6.3 on 05/27/2023 Sugar level elevated with TPN. Continue SSI/Accu-Cheks Recent Labs  Lab 06/04/23 1209 06/04/23 1819 06/05/23 0017 06/05/23 0459 06/05/23 1127  GLUCAP 157* 143* 163* 146* 159*    Hypothyroidism Resume Synthroid  today  Acute urinary retention Foley catheter was removed on 5/30.  Successful voiding trial.     Mobility: Encourage ambulation.  Uses a walker.  Ambulates daily.  Goals of care   Code Status: Full Code     DVT prophylaxis:  SCD's Start: 05/25/23 1501 Place TED hose Start: 05/25/23 1501   Antimicrobials: None Fluid: TPN Family Communication: None at bedside  Status: Inpatient Level of care:  Progressive   Patient is from: Home Needs to continue in-hospital care: Postop ileus improving Anticipated d/c to: Hopefully home in 1 to 2 days   Diet:  Diet Order             Diet full liquid Fluid consistency: Thin  Diet effective now                   Scheduled Meds:  acetaminophen   1,000 mg Oral Q6H   Chlorhexidine  Gluconate Cloth  6 each Topical Daily   digoxin   0.125 mg Intravenous Daily   insulin  aspart  0-9 Units Subcutaneous Q6H   levothyroxine   75 mcg Oral QAC breakfast   mouth rinse  15 mL Mouth Rinse 4 times per day    PRN meds: albuterol ,  diphenhydrAMINE  **OR** diphenhydrAMINE , hydrALAZINE , lidocaine , lip balm, menthol-cetylpyridinium, methocarbamol  (ROBAXIN ) injection, metoprolol  tartrate, nitroGLYCERIN , ondansetron  **OR** ondansetron  (ZOFRAN ) IV, mouth rinse, mouth rinse, phenol, prochlorperazine , simethicone , sodium chloride  flush, traMADol    Infusions:   diltiazem  (CARDIZEM ) infusion 10 mg/hr (06/05/23 0842)   heparin  850 Units/hr (06/05/23 0912)   TPN ADULT (ION) 80 mL/hr at 06/05/23 0548   TPN ADULT (ION)      Antimicrobials: Anti-infectives (From admission,  onward)    Start     Dose/Rate Route Frequency Ordered Stop   05/25/23 1400  neomycin  (MYCIFRADIN ) tablet 1,000 mg  Status:  Discontinued       Placed in "And" Linked Group   1,000 mg Oral 3 times per day 05/25/23 0835 05/25/23 0840   05/25/23 1400  metroNIDAZOLE  (FLAGYL ) tablet 1,000 mg  Status:  Discontinued       Placed in "And" Linked Group   1,000 mg Oral 3 times per day 05/25/23 0835 05/25/23 0840   05/25/23 0845  cefoTEtan  (CEFOTAN ) 2 g in sodium chloride  0.9 % 100 mL IVPB        2 g 200 mL/hr over 30 Minutes Intravenous On call to O.R. 05/25/23 0835 05/26/23 0809       Objective: Vitals:   06/05/23 0800 06/05/23 0833  BP: (!) 141/88   Pulse: (!) 108 (!) 120  Resp: 20   Temp:    SpO2: 97%     Intake/Output Summary (Last 24 hours) at 06/05/2023 1241 Last data filed at 06/05/2023 0900 Gross per 24 hour  Intake 2919.8 ml  Output --  Net 2919.8 ml   Filed Weights   06/03/23 0500 06/04/23 0500 06/05/23 0430  Weight: 73.5 kg 73 kg 78.1 kg   Weight change: 5.1 kg Body mass index is 31.49 kg/m.   Physical Exam: General exam: Pleasant, elderly Caucasian female.  Not in pain Skin: No rashes, lesions or ulcers. HEENT: Atraumatic, normocephalic, no obvious bleeding.  Has NG tube attached to suction as of this morning Lungs: Diminished entry in both bases.  Otherwise clear to auscultation bilaterally.  Not on supplemental oxygen CVS: S1, S2, no murmur,   GI/Abd: Soft, postop tenderness present, distention still present.  Tympanic on percussion, bowel sound present,   CNS: Alert, awake, oriented x 3 Psychiatry: Mood appropriate Extremities: Trace bilateral pedal edema, no calf tenderness,   Data Review: I have personally reviewed the laboratory data and studies available.  F/u labs ordered Unresulted Labs (From admission, onward)     Start     Ordered   06/06/23 0500  Basic metabolic panel  Tomorrow morning,   R       Question:  Specimen collection method  Answer:  IV  Team=IV Team collect   06/05/23 0851   06/06/23 0500  Magnesium  Tomorrow morning,   R       Question:  Specimen collection method  Answer:  IV Team=IV Team collect   06/05/23 0851   06/06/23 0500  Phosphorus  Tomorrow morning,   R       Question:  Specimen collection method  Answer:  IV Team=IV Team collect   06/05/23 0851   06/05/23 0813  CBC  Daily,   R     Question:  Specimen collection method  Answer:  IV Team=IV Team collect   06/05/23 4098   06/05/23 0500  Triglycerides  (Standard TPN Labs (all labs to be drawn at 0500))  Every Monday (0500),   R     Question:  Specimen  collection method  Answer:  Lab=Lab collect   05/31/23 0647   06/03/23 0500  Heparin  level (unfractionated)  Daily,   R     Question:  Specimen collection method  Answer:  IV Team=IV Team collect   06/01/23 1827   06/01/23 0500  Comprehensive metabolic panel  (Standard TPN Labs (all labs to be drawn at 0500))  Every Mon,Thu (0500),   R     Question:  Specimen collection method  Answer:  Lab=Lab collect   05/31/23 0647   06/01/23 0500  Magnesium  (Standard TPN Labs (all labs to be drawn at 0500))  Every Mon,Thu (0500),   R     Question:  Specimen collection method  Answer:  Lab=Lab collect   05/31/23 0647   06/01/23 0500  Phosphorus  (Standard TPN Labs (all labs to be drawn at 0500))  Every Mon,Thu (0500),   R     Question:  Specimen collection method  Answer:  Lab=Lab collect   05/31/23 1610            Signed, Hoyt Macleod, MD [facility] 06/05/2023

## 2023-06-05 NOTE — Progress Notes (Signed)
 Progress Note  Patient Name: Dominique Barber Date of Encounter: 06/05/2023  Primary Cardiologist: Randene Bustard, MD   Subjective   Patient seen and examined at her bedside.   Inpatient Medications    Scheduled Meds:  acetaminophen   1,000 mg Oral Q6H   Chlorhexidine  Gluconate Cloth  6 each Topical Daily   digoxin   0.125 mg Intravenous Daily   insulin  aspart  0-9 Units Subcutaneous Q6H   levothyroxine   75 mcg Oral QAC breakfast   mouth rinse  15 mL Mouth Rinse 4 times per day   Continuous Infusions:  diltiazem  (CARDIZEM ) infusion 10 mg/hr (06/05/23 0842)   heparin  850 Units/hr (06/05/23 0912)   potassium chloride      TPN ADULT (ION) 80 mL/hr at 06/05/23 0548   TPN ADULT (ION)     PRN Meds: albuterol , diphenhydrAMINE  **OR** diphenhydrAMINE , hydrALAZINE , lidocaine , lip balm, menthol-cetylpyridinium, methocarbamol  (ROBAXIN ) injection, metoprolol  tartrate, nitroGLYCERIN , ondansetron  **OR** ondansetron  (ZOFRAN ) IV, mouth rinse, mouth rinse, phenol, prochlorperazine , simethicone , sodium chloride  flush, traMADol    Vital Signs    Vitals:   06/05/23 0415 06/05/23 0430 06/05/23 0800 06/05/23 0833  BP:   (!) 141/88   Pulse:   (!) 108 (!) 120  Resp:   20   Temp: 98.5 F (36.9 C)     TempSrc: Oral     SpO2:   97%   Weight:  78.1 kg    Height:        Intake/Output Summary (Last 24 hours) at 06/05/2023 1051 Last data filed at 06/05/2023 0900 Gross per 24 hour  Intake 2969.8 ml  Output 225 ml  Net 2744.8 ml   Filed Weights   06/03/23 0500 06/04/23 0500 06/05/23 0430  Weight: 73.5 kg 73 kg 78.1 kg    Telemetry     - Personally Reviewed  ECG     - Personally Reviewed  Physical Exam   General: Comfortable Head: Atraumatic, normal size  Eyes: PEERLA, EOMI  Neck: Supple, normal JVD Cardiac: Normal S1, S2; RRR; no murmurs, rubs, or gallops Lungs: Clear to auscultation bilaterally Abd: Soft, nontender, no hepatomegaly  Ext: warm, no edema Musculoskeletal: No  deformities, BUE and BLE strength normal and equal Skin: Warm and dry, no rashes   Neuro: Alert and oriented to person, place, time, and situation, CNII-XII grossly intact, no focal deficits  Psych: Normal mood and affect   Labs    Chemistry Recent Labs  Lab 06/03/23 0453 06/04/23 1103 06/05/23 0219  NA 136 139 136  K 3.5 3.9 3.6  CL 98 99 101  CO2 29 30 26   GLUCOSE 146* 151* 140*  BUN 20 24* 25*  CREATININE 0.86 0.80 1.06*  CALCIUM  8.0* 8.5* 8.1*  PROT 5.7* 5.5* 5.3*  ALBUMIN 2.7* 2.7* 2.6*  AST 31 26 27   ALT 61* 59* 56*  ALKPHOS 58 65 70  BILITOT 0.5 0.4 0.5  GFRNONAA >60 >60 58*  ANIONGAP 9 10 9      Hematology Recent Labs  Lab 06/02/23 0349 06/03/23 0453 06/05/23 0830  WBC 9.8 9.0 14.5*  RBC 3.32* 3.30* 3.08*  HGB 9.0* 8.9* 8.3*  HCT 29.3* 29.6* 28.0*  MCV 88.3 89.7 90.9  MCH 27.1 27.0 26.9  MCHC 30.7 30.1 29.6*  RDW 18.6* 18.6* 18.6*  PLT 308 283 270    Cardiac EnzymesNo results for input(s): "TROPONINI" in the last 168 hours. No results for input(s): "TROPIPOC" in the last 168 hours.   BNPNo results for input(s): "BNP", "PROBNP" in the last 168 hours.  DDimer No results for input(s): "DDIMER" in the last 168 hours.   Radiology    DG Abd Portable 1V Result Date: 06/04/2023 CLINICAL DATA:  [id number]. Follow-up for ileus following gastrointestinal surgery. EXAM: PORTABLE ABDOMEN - 1 VIEW COMPARISON:  CT abdomen and pelvis 05/31/2023 FINDINGS: Small-bowel dilatation is not notably changed from 4 days ago. Maximum small bowel caliber is 3.7 cm. A general paucity of colonic gas is again noted but there is a small amount in left-sided colon. NGT terminates in the mid gastric body. There is no supine evidence of free air. Degenerative change and mild levoscoliosis lumbar spine. IMPRESSION: 1. Small-bowel dilatation is not notably changed from 4 days ago. Maximum small bowel caliber is 3.7 cm. 2. NGT terminates in the mid gastric body. Electronically Signed   By:  Denman Fischer M.D.   On: 06/04/2023 07:56    Cardiac Studies     Patient Profile     68 y.o. female wiith STEMI 1998 w/ VF arrest & anoxic brain injury w/ status epilepticus, s/p blow-by w/ BMS LAD 1 mo later. ISR>> CABG 1998 w/ LIMA-LAD & SVG-D1, AMI w/ emergent re-operation w/ SVG-LAD 1998, PTCA/DES to mSVG-LAD 01/2020, prior ICM with normalized LVEF by echo 03/2023, PAF, pre-DM, HLD, hypothyroidism, COPD, cecal adenocarcinoma admitted for elective right hemicolectomy. Post-op course notable for acute renal insufficiency, post-operative ileus/sluggish transit requiring NPO status, hypokalemia, hy   Assessment & Plan    PAF-digoxin  has been transitioned to p.o. medication.  She is currently on 125 mcg daily of digoxin .  Will get a digoxin  level today.  She is on Toprol  XL 25 mg at home.  Will restart this medication and then plan to stop the Cardizem  drip.  We can also transition from heparin  drip to Xarelto  if surgery is okay with this. Heart rate is reasonably controlled, will continue to follow     For questions or updates, please contact [facility] Please consult [email or web address] for contact info under Cardiology/STEMI.      Signed, Spiro Ausborn, DO  06/05/2023, 10:51 AM

## 2023-06-05 NOTE — Progress Notes (Signed)
 NGT marking at right nare & clip was at 58 instead of 60, tube advanced without difficulty. Charge nurse at bedside assisting with holding NGT while I secured.

## 2023-06-05 NOTE — Progress Notes (Signed)
 Mobility Specialist - Progress Note  (RA) Pre-mobility: 98 bpm HR, 99% SpO2 During mobility: 143 bpm HR, 96% SpO2 Post-mobility: 92 bpm HR, 97% SPO2   06/05/23 1109  Mobility  Activity Transferred to/from [facility];Transferred from bed to chair  Level of Assistance Contact guard assist, steadying assist  Assistive Device Front wheel walker;BSC  Distance Ambulated (ft) 2 ft  Range of Motion/Exercises Active  Activity Response Tolerated fair  Mobility Referral Yes  Mobility visit 1 Mobility  Mobility Specialist Start Time (ACUTE ONLY) 1050  Mobility Specialist Stop Time (ACUTE ONLY) 1109  Mobility Specialist Time Calculation (min) (ACUTE ONLY) 19 min   Pt was found in bed and agreeable to mobilize. Limited pt to transfer only during session due to increase HR 130-143 bpm. At EOS was left on recliner chair with all needs met. Call bell in reach and RN notified.  Lorna Rose,  Mobility Specialist Can be reached via Secure Chat

## 2023-06-05 NOTE — Progress Notes (Signed)
   06/05/23 0000  Assess: MEWS Score  BP (!) 145/99  MAP (mmHg) 112  Pulse Rate 73  ECG Heart Rate 83  Resp 19  Level of Consciousness Alert  SpO2 95 %  O2 Device Room Air  Patient Activity (if Appropriate) In bed  Assess: SIRS CRITERIA  SIRS Temperature  0  SIRS Respirations  0  SIRS Pulse 0  SIRS WBC 0  SIRS Score Sum  0

## 2023-06-05 NOTE — Progress Notes (Signed)
 11 Days Post-Op   Subjective/Chief Complaint: Has had multiple BM over weekend, passing flatus, tolerating clear liquids with NG clamped x ~24 hrs. No n/v.    Objective: Vital signs in last 24 hours: Temp:  [98.1 F (36.7 C)-99.4 F (37.4 C)] 98.5 F (36.9 C) (06/02 0415) Pulse Rate:  [60-133] 79 (06/02 0400) Resp:  [14-22] 18 (06/02 0400) BP: (123-145)/(65-99) 132/73 (06/02 0400) SpO2:  [92 %-96 %] 94 % (06/02 0400) Weight:  [78.1 kg] 78.1 kg (06/02 0430) Last BM Date : 06/03/23  Intake/Output from previous day: 06/01 0701 - 06/02 0700 In: 2555.8 [P.O.:170; I.V.:2385.8] Out: 225 [Emesis/NG output:225] Intake/Output this shift: No intake/output data recorded.  General nad Pulm effort normal Rate normal Ab soft, nontender, not significantly distended incision clean  Lab Results:  Recent Labs    06/03/23 0453  WBC 9.0  HGB 8.9*  HCT 29.6*  PLT 283   BMET Recent Labs    06/04/23 1103 06/05/23 0219  NA 139 136  K 3.9 3.6  CL 99 101  CO2 30 26  GLUCOSE 151* 140*  BUN 24* 25*  CREATININE 0.80 1.06*  CALCIUM  8.5* 8.1*   PT/INR No results for input(s): "LABPROT", "INR" in the last 72 hours. ABG No results for input(s): "PHART", "HCO3" in the last 72 hours.  Invalid input(s): "PCO2", "PO2"  Studies/Results: DG Abd Portable 1V Result Date: 06/04/2023 CLINICAL DATA:  [id number]. Follow-up for ileus following gastrointestinal surgery. EXAM: PORTABLE ABDOMEN - 1 VIEW COMPARISON:  CT abdomen and pelvis 05/31/2023 FINDINGS: Small-bowel dilatation is not notably changed from 4 days ago. Maximum small bowel caliber is 3.7 cm. A general paucity of colonic gas is again noted but there is a small amount in left-sided colon. NGT terminates in the mid gastric body. There is no supine evidence of free air. Degenerative change and mild levoscoliosis lumbar spine. IMPRESSION: 1. Small-bowel dilatation is not notably changed from 4 days ago. Maximum small bowel caliber is 3.7 cm. 2.  NGT terminates in the mid gastric body. Electronically Signed   By: Denman Fischer M.D.   On: 06/04/2023 07:56    Anti-infectives: Anti-infectives (From admission, onward)    Start     Dose/Rate Route Frequency Ordered Stop   05/25/23 1400  neomycin  (MYCIFRADIN ) tablet 1,000 mg  Status:  Discontinued       Placed in "And" Linked Group   1,000 mg Oral 3 times per day 05/25/23 0835 05/25/23 0840   05/25/23 1400  metroNIDAZOLE  (FLAGYL ) tablet 1,000 mg  Status:  Discontinued       Placed in "And" Linked Group   1,000 mg Oral 3 times per day 05/25/23 0835 05/25/23 0840   05/25/23 0845  cefoTEtan  (CEFOTAN ) 2 g in sodium chloride  0.9 % 100 mL IVPB        2 g 200 mL/hr over 30 Minutes Intravenous On call to O.R. 05/25/23 0454 05/26/23 0809       Assessment/Plan: POD 11 lap right colon -has done great with ng clamped, exam reassuring, tol clear liquids. Remove ng and advance to full liquids; adat to soft diet later today if tol fulls well -continue TPN until reliably tolerating solid foods -ambulate 5x/day -ok to begin to transition to PO medications -PPX: SCDs; on hep gtt per [facility]/cards  Renford Cartwright Emelina Hinch 06/05/2023

## 2023-06-05 NOTE — Progress Notes (Signed)
 PHARMACY - TOTAL PARENTERAL NUTRITION CONSULT NOTE   Indication: Prolonged ileus  Patient Measurements: Height: 5\' 2"  (157.5 cm) Weight: 78.1 kg (172 lb 2.9 oz) IBW/kg (Calculated) : 50.1 TPN AdjBW (KG): 57.1 Body mass index is 31.49 kg/m.  Assessment: Patient is a 68 y.o F with a recent diagnosis of colon cancer who presented to [facility] on 05/25/23 for right hemicolectomy. Pharmacy has been consulted on 05/31/23 to start TPN for post-op ileus.  Glucose / Insulin : A1c 6.3; no hx DM - CBGs (goal <150): 140-163 - Past 24 hr insulin  aspart usage: 6 units Electrolytes: All WNL, including CorrCa (9.2).  -K (3.6) is on lower end of range -Goal for ileus: Mg >2, K >4 Renal: SCr increased to 1.06, BUN slightly elevated Hepatic: Alk Phos, Tbili, AST WNL. ALT slightly elevated -Albumin low Intake / Output; MIVF: no current MIVF -Strict I/O note measured -UOP: 5x, stool x1, NG output: 225 mL GI Imaging: - 5/24 and Xray: Mildly dilated loops of small bowel.This is nonspecific and could represent ileus or partial small bowel obstruction. - 5/26 abd Xray: Persistent small bowel dilatation  - 5/28 abd CT: Possible small-bowel obstruction with transition point in the right anterior abdomen. Primary differential consideration is enteritis with ileus GI Surgeries / Procedures:  - 5/22: Laparoscopic right hemicolectomy   Central access: Double lumen PICC placed 5/28 TPN start date: 5/28  Nutritional Goals: Goal TPN rate is 80 mL/hr (provides 94 g of protein and 1959 kcals per day)  RD Assessment:  Estimated Needs Total Energy Estimated Needs: 1800-2000 Total Protein Estimated Needs: 90-100 grams Total Fluid Estimated Needs: >1.8 L/day  Current Nutrition:  NPO Full liquid diet  Plan:   Now: KCl 10 mEq IV x2 runs Increase q6h SSI from sensitive to moderate  At 1800: Continue TPN at 80 mL/hr  Electrolytes in TPN: Increase K Na 154 mEq/L (max concentration) K 45 mEq/L Ca 5 mEq/L Mg 3  mEq/L Phos 15 mmol/L Cl:Ac 1:2 Add standard MVI and trace elements to TPN Continue moderate SSI q6h and adjust as needed  Thiamine  100 mg daily x5 days (5/28 - 6/1) Monitor TPN labs on Mon/Thurs. Recheck electrolytes with AM labs tomorrow  Shireen Dory, PharmD 06/05/23 8:46 AM

## 2023-06-05 NOTE — Telephone Encounter (Signed)
 Patient had thyroid  lab recently on May 28 I reviewed with normal TSH and mildly elevated free T4, result may have been affected by recent CT scan with IV iodine contrast making elevated free T4.  In this context she does not need to keep lab visit on June 5.  If she cannot make follow-up visit with me on June 11 is okay to reschedule for 2-3 months and continue the current dose of levothyroxine /Synthroid .  We can do the lab TSH, free T4 prior to visit at that time.  Dominique Elaisha Zahniser, MD [facility] [address] [place], [address] [place], [place] [address] Phone # [phone]

## 2023-06-05 NOTE — Progress Notes (Signed)
 PHARMACY - ANTICOAGULATION CONSULT NOTE  Pharmacy Consult for heparin   Indication: atrial fibrillation (PTA Xarelto  on hold)  Allergies  Allergen Reactions   Iodinated Contrast Media Hives and Dermatitis   Linzess [Linaclotide] Other (See Comments)    Caused horrible stomach pain and gas   Atorvastatin  Other (See Comments)    Gas pain in abdomen with no relief   Other Diarrhea and Other (See Comments)    IBS- Special diet   Milk-Related Compounds Diarrhea and Other (See Comments)    Has IBS   Rosuvastatin  Calcium  Other (See Comments)    Worsening GI upset   Simvastatin Other (See Comments)    Myalgia & GI upset   Eluxadoline Nausea And Vomiting and Other (See Comments)    Viberzi - stomach pain   Iohexol  Hives, Rash and Other (See Comments)   Marinol [Dronabinol] Palpitations and Other (See Comments)    "Made me feel high and almost passed out"   Red Dye #40 (Allura Red) Hives and Rash    Red contrast dye     Patient Measurements: Height: 5\' 2"  (157.5 cm) Weight: 78.1 kg (172 lb 2.9 oz) IBW/kg (Calculated) : 50.1 HEPARIN  DW (KG): 67.3  Vital Signs: Temp: 98.5 F (36.9 C) (06/02 0415) Temp Source: Oral (06/02 0415) BP: 132/73 (06/02 0400) Pulse Rate: 79 (06/02 0400)  Labs: Recent Labs    06/03/23 0453 06/04/23 0240 06/04/23 1103 06/05/23 0219  HGB 8.9*  --   --   --   HCT 29.6*  --   --   --   PLT 283  --   --   --   HEPARINUNFRC 0.70 0.40  --  0.40  CREATININE 0.86  --  0.80 1.06*    Estimated Creatinine Clearance: 49.8 mL/min (A) (by C-G formula based on SCr of 1.06 mg/dL (H)).   Medical History: Past Medical History:  Diagnosis Date   ACS (acute coronary syndrome) (HCC) 01/06/2020   With progressive angina while troponin elevation--borderline-NON-STEMI   ANTERIOR STEMI of LAD (x2). 01/19/1996   Complicated by VF arrest, and anoxic brain injury with status epilepticus; POBA of LAD --> 6 months later, after 2 vessel CABG she had postop complication  with occluded LIMA/second anterior STEMI   CAD in native artery & Grafts 01/1996   a) 1/'98: Ant STEMI => LAD POBA; b) 2/'98: 95% prox LAD @ POBA site --> BMS PCI (3.0 mm x 25 mm); c) 7/98 (5 months later) => BMS ISR of LAD--> CABGx2 (LIMA-LAD, SVG-D1 -> Emergent redo w/ SVG-LAD b/c acute LIMA occlusion); d) 01/2020: ACS -> DES PCI of SVG-LAD (75%&85%), TO of SVG-D1.   COPD (chronic obstructive pulmonary disease) (HCC)    Dyspnea    GERD (gastroesophageal reflux disease)    Headache    Hiatal hernia    Hyperlipidemia    Hypertension    Hypothyroidism    On Synthroid    Iron deficiency anemia    Irritable bowel syndrome    Followed by Dr. Tami Falcon.   Ischemic cardiomyopathy 01/03/2020   ECHO (ACS): EF 45-50% (by Cath EF 40-45%) - Apical Inferior/Apical Anteroseptal & Apex akinetic, GR II DD.    PAF (paroxysmal atrial fibrillation) (HCC) 05/03/2014   New onset - originally with RVR   Pneumonia    x 2   PONV (postoperative nausea and vomiting)    very gag reflex   S/P CABG x 2; with EMERGENT Redo x 1. 07/1996   Initially LIMA-LAD, SVG-D1 --> immediate LIMA failure post  CABG with anterior MI --> urgent redo SVG-LAD beyond D2.; Widely patent grafts as of 2005 with left dominant system. Graft to the diagonal branch has a very small target vessel but the vein graft to LAD retrograde fills a large bifurcating D2 and antegrade fills a large D3.   Tobacco abuse     Medications:  -Xarelto  20 mg daily PTA  Assessment: Patient is a 68 y.o with hx afib on Xarelto  PTA and was recently diagnosed with colon cancer who presented to [facility] on 05/25/23 for right hemicolectomy.  She underwent lap right hemicolectomy on 05/25/23 with heparin  drip started post-op on 05/28/23.  Today, 06/05/2023: Heparin  level = 0.40 remains therapeutic on heparin  infusion of 850 units/hr CBC: Hgb low, Plt WNL Confirmed with RN - no signs of bleeding  Goal of Therapy:  Heparin  level 0.3-0.7 units/ml Monitor platelets by  anticoagulation protocol: Yes   Plan:  Continue heparin  at 850 units/hr CBC, heparin  level daily Monitor for signs of bleeding  Shireen Dory, PharmD, BCPS  Clinical Pharmacist 06/05/2023 8:18 AM

## 2023-06-06 ENCOUNTER — Ambulatory Visit: Payer: Self-pay | Admitting: Surgery

## 2023-06-06 DIAGNOSIS — Z9049 Acquired absence of other specified parts of digestive tract: Secondary | ICD-10-CM | POA: Diagnosis not present

## 2023-06-06 LAB — BASIC METABOLIC PANEL WITH GFR
Anion gap: 7 (ref 5–15)
BUN: 23 mg/dL (ref 8–23)
CO2: 26 mmol/L (ref 22–32)
Calcium: 8.1 mg/dL — ABNORMAL LOW (ref 8.9–10.3)
Chloride: 102 mmol/L (ref 98–111)
Creatinine, Ser: 0.83 mg/dL (ref 0.44–1.00)
GFR, Estimated: >60 mL/min (ref >60)
Glucose, Bld: 136 mg/dL — ABNORMAL HIGH (ref 70–99)
Potassium: 3.9 mmol/L (ref 3.5–5.1)
Sodium: 135 mmol/L (ref 135–145)

## 2023-06-06 LAB — CBC
HCT: 25.3 % — ABNORMAL LOW (ref 36.0–46.0)
Hemoglobin: 7.6 g/dL — ABNORMAL LOW (ref 12.0–15.0)
MCH: 29.5 pg (ref 26.0–34.0)
MCHC: 30 g/dL (ref 30.0–36.0)
MCV: 98.1 fL (ref 80.0–100.0)
Platelets: 240 10*3/uL (ref 150–400)
RBC: 2.58 MIL/uL — ABNORMAL LOW (ref 3.87–5.11)
RDW: 19.7 % — ABNORMAL HIGH (ref 11.5–15.5)
WBC: 10.3 10*3/uL (ref 4.0–10.5)
nRBC: 0.2 % (ref 0.0–0.2)

## 2023-06-06 LAB — HEPARIN LEVEL (UNFRACTIONATED)
Heparin Unfractionated: 0.25 [IU]/mL — ABNORMAL LOW (ref 0.30–0.70)
Heparin Unfractionated: 0.38 [IU]/mL (ref 0.30–0.70)

## 2023-06-06 LAB — PHOSPHORUS: Phosphorus: 3.7 mg/dL (ref 2.5–4.6)

## 2023-06-06 LAB — GLUCOSE, CAPILLARY
Glucose-Capillary: 152 mg/dL — ABNORMAL HIGH (ref 70–99)
Glucose-Capillary: 140 mg/dL — ABNORMAL HIGH (ref 70–99)
Glucose-Capillary: 128 mg/dL — ABNORMAL HIGH (ref 70–99)

## 2023-06-06 LAB — MAGNESIUM: Magnesium: 1.9 mg/dL (ref 1.7–2.4)

## 2023-06-06 MED ORDER — RIVAROXABAN 20 MG PO TABS
20.0000 mg | ORAL_TABLET | Freq: Every day | ORAL | Status: DC
  Administered 2023-06-06 – 2023-06-07 (×2): 20 mg via ORAL
  Filled 2023-06-06 (×2): qty 1

## 2023-06-06 MED ORDER — ADULT MULTIVITAMIN W/MINERALS CH
1.0000 | ORAL_TABLET | Freq: Every day | ORAL | Status: DC
  Administered 2023-06-06 – 2023-06-08 (×3): 1 via ORAL
  Filled 2023-06-06 (×3): qty 1

## 2023-06-06 MED ORDER — POTASSIUM CHLORIDE 20 MEQ PO PACK
20.0000 meq | PACK | Freq: Once | ORAL | Status: DC
  Filled 2023-06-06: qty 1

## 2023-06-06 MED ORDER — TRAVASOL 10 % IV SOLN
INTRAVENOUS | Status: DC
  Filled 2023-06-06: qty 940.8

## 2023-06-06 MED ORDER — METOPROLOL SUCCINATE ER 50 MG PO TB24
25.0000 mg | ORAL_TABLET | Freq: Every day | ORAL | Status: DC
  Administered 2023-06-06 – 2023-06-07 (×2): 25 mg via ORAL
  Filled 2023-06-06 (×2): qty 1

## 2023-06-06 NOTE — Progress Notes (Addendum)
 PHARMACY - TOTAL PARENTERAL NUTRITION CONSULT NOTE   Indication: Prolonged ileus  Patient Measurements: Height: 5\' 2"  (157.5 cm) Weight: 81.6 kg (179 lb 14.3 oz) IBW/kg (Calculated) : 50.1 TPN AdjBW (KG): 57.1 Body mass index is 32.9 kg/m.  Assessment: Patient is a 68 y.o F with a recent diagnosis of colon cancer who presented to [facility] on 05/25/23 for right hemicolectomy. Pharmacy has been consulted on 05/31/23 to start TPN for post-op ileus.  Glucose / Insulin : A1c 6.3; no hx DM - CBGs (goal <180): 136-168 - Past 24 hr insulin  aspart usage: 6 units Electrolytes: All WNL, including CorrCa (9.2) Renal: SCr/BUN decreased to WNL Hepatic: Alk Phos, Tbili, AST WNL. ALT slightly elevated -Albumin low Intake / Output; MIVF: no current MIVF -Strict I/O not measured -UOP: 5x, stool x5, NG removed -Diet advanced to full liquid yesterday, intake not charted GI Imaging: - 5/24 and Xray: Mildly dilated loops of small bowel.This is nonspecific and could represent ileus or partial small bowel obstruction. - 5/26 abd Xray: Persistent small bowel dilatation  - 5/28 abd CT: Possible small-bowel obstruction with transition point in the right anterior abdomen. Primary differential consideration is enteritis with ileus GI Surgeries / Procedures:  - 5/22: Laparoscopic right hemicolectomy   Central access: Double lumen PICC placed 5/28 TPN start date: 5/28  Nutritional Goals: Goal TPN rate is 80 mL/hr (provides 94 g of protein and 1959 kcals per day)  RD Assessment:  Estimated Needs Total Energy Estimated Needs: 1800-2000 Total Protein Estimated Needs: 90-100 grams Total Fluid Estimated Needs: >1.8 L/day  Current Nutrition:  NPO Full liquid diet  Plan:  Now: KCl 20 mEq PO x1 dose  At 1800: Continue TPN at 80 mL/hr  Electrolytes in TPN: No changes Na 154 mEq/L (max concentration) K 45 mEq/L Ca 5 mEq/L Mg 3 mEq/L Phos 15 mmol/L Cl:Ac 1:2 Add standard trace elements to TPN MVI to PO  today Continue moderate SSI q6h and adjust as needed  Monitor TPN labs on Mon/Thurs Follow for toleration of advancing diet and ability to wean off of TPN.   Shireen Dory, PharmD 06/06/23 8:41 AM  Addendum: Consult order received from [facility] to reduce TPN to half-rate today, but order placed after the noon deadline. TPN already ordered at full rate for today, will wean to half-rate tomorrow.  Shireen Dory, PharmD 06/06/23 12:35 PM

## 2023-06-06 NOTE — Progress Notes (Signed)
 Nutrition Follow-up  INTERVENTION:   -TPN management per Pharmacy -Daily weights while on TPN  -Diet advancement as tolerated  NUTRITION DIAGNOSIS:   Inadequate oral intake related to altered GI function (ileus) as evidenced by NPO status.  Ongoing.  GOAL:   Patient will meet greater than or equal to 90% of their needs  Meeting with TPN  MONITOR:   Diet advancement, Labs, Weight trends  ASSESSMENT:   Pt with PMH of COPD, Afib, and DM admitted for elective R hemicolectomy for cecal adenocarcinoma  5/22 Admit; s/p Laparoscopic right hemicolectomy; CLD 5/23 FLD -> Soft diet 5/24 CLD -> NPO; NGT placed for suction d/t ileus 5/28 TPN to be initiated 6/2 Full liquids started, NGT removed  Patient now on full liquids. Most protein shake options not suitable for FODMAP diet. Encourage liquid options.   TPN continues at goal rate of 80 ml/hr, providing 1958 kcals and 94g protein.  Admission weight: 158 lbs Current weight: 179 lbs  Medications: Multivitamin with minerals daily, KLOR-CON   Labs reviewed: CBGs: 152-168   Diet Order:   Diet Order             Diet full liquid Fluid consistency: Thin  Diet effective now                   EDUCATION NEEDS:   Education needs have been addressed  Skin:  Skin Assessment: Reviewed RN Assessment (abd incision)  Last BM:  6/ 3- type 7  Height:   Ht Readings from Last 1 Encounters:  05/26/23 5\' 2"  (1.575 m)    Weight:   Wt Readings from Last 1 Encounters:  06/06/23 81.6 kg    BMI:  Body mass index is 32.9 kg/m.  Estimated Nutritional Needs:   Kcal:  1800-2000  Protein:  90-100 grams  Fluid:  >1.8 L/day   Arna Better, MS, RD, LDN Inpatient Clinical Dietitian Contact via Secure chat

## 2023-06-06 NOTE — Progress Notes (Addendum)
 PROGRESS NOTE  ABBIGAL RADICH  DOB: January 20, 1955  PCP: Abraham Abo, MD ZOX:[id number]  DOA: 05/25/2023  LOS: 12 days  Hospital Day: 13  Brief narrative: Dominique Barber is a 68 y.o. female with PMH significant for HTN, HLD, CAD with h/o V-fib arrest and subsequent CABG, ischemic cardiomyopathy, paroxysmal A-fib, COPD, not on oxygen at home. 03/17/2023, patient underwent colonoscopy by Dr. Nickey Barn for iron deficiency anemia.  She was found to have tumor in the ascending colon, biopsy showed moderately differentiated invasive adenocarcinoma. She was seen by general surgery and as planned underwent elective right hemicolectomy on 5/22. Hospitalist service was consulted for postoperative medical management. 5/22, cardiology service was also consulted for A-fib with RVR  Subjective: Patient was seen and examined this morning.  Propped up in bed.   Had multiple bowel movements in the last 24 hours. Tolerating soft diet.  Assessment and plan: Colon cancer S/p right hemicolectomy 5/22 Dr. Camilo Cella Post-operative ileus Improved postop ileus. Noted a plan from surgery to slow down on TPN Recent Labs  Lab 06/02/23 0349 06/03/23 0453 06/04/23 1103 06/05/23 0219 06/06/23 0900  K 3.1* 3.5 3.9 3.6 3.9  MG 2.3 2.1 2.1 2.1 1.9  PHOS 2.6 3.3 3.8 3.7 3.7   A-fib with RVR  H/o paroxysmal A-fib 5/25, patient went to RVR.   Cardiology following.   Currently on Cardizem  drip, digoxin .  Noted a plan from cardiology to stop both of them and resume home metoprolol  For anticoag, currently on heparin  drip.  Noted a plan to switch to Xarelto  prior to discharge  CAD/CABG Currently does not have any anginal symptoms.   PTA meds-Xarelto , PCSK9i as an outpatient Xarelto  plan as above  Hyperglycemia A1c 6.3 on 05/27/2023 Sugar level elevated with TPN. Continue SSI/Accu-Cheks Recent Labs  Lab 06/05/23 1127 06/05/23 1843 06/05/23 2324 06/06/23 0514 06/06/23 1138  GLUCAP 159* 154* 168* 152*  140*    Hypothyroidism Continue Synthroid    Acute urinary retention Foley catheter was removed on 5/30.  Successful voiding trial.   Patient is clinically much better and is nearing discharge.  General surgery is the primary team and cardiology is following as well. TRH will sign off today.    Mobility: Encourage ambulation.  Uses a walker.  Ambulates daily.  Goals of care   Code Status: Full Code     DVT prophylaxis:  SCD's Start: 05/25/23 1501 Place TED hose Start: 05/25/23 1501 rivaroxaban  (XARELTO ) tablet 20 mg   Antimicrobials: None Fluid: TPN Family Communication: None at bedside  Status: Inpatient Level of care:  Progressive   Patient is from: Home Needs to continue in-hospital care: Postop ileus improving  Diet:  Diet Order             DIET SOFT Fluid consistency: Thin  Diet effective now                   Scheduled Meds:  acetaminophen   1,000 mg Oral Q6H   Chlorhexidine  Gluconate Cloth  6 each Topical Daily   insulin  aspart  0-9 Units Subcutaneous Q6H   levothyroxine   75 mcg Oral QAC breakfast   metoprolol  succinate  25 mg Oral Daily   multivitamin with minerals  1 tablet Oral Daily   mouth rinse  15 mL Mouth Rinse 4 times per day   potassium chloride   20 mEq Oral Once   rivaroxaban   20 mg Oral Q supper    PRN meds: albuterol , diphenhydrAMINE  **OR** diphenhydrAMINE , hydrALAZINE , lidocaine , lip balm, menthol-cetylpyridinium, methocarbamol  (ROBAXIN )  injection, nitroGLYCERIN , ondansetron  **OR** ondansetron  (ZOFRAN ) IV, mouth rinse, mouth rinse, phenol, prochlorperazine , simethicone , sodium chloride  flush, traMADol    Infusions:   TPN ADULT (ION) 80 mL/hr at 06/05/23 1718   TPN ADULT (ION)      Antimicrobials: Anti-infectives (From admission, onward)    Start     Dose/Rate Route Frequency Ordered Stop   05/25/23 1400  neomycin  (MYCIFRADIN ) tablet 1,000 mg  Status:  Discontinued       Placed in "And" Linked Group   1,000 mg Oral 3 times per  day 05/25/23 0835 05/25/23 0840   05/25/23 1400  metroNIDAZOLE  (FLAGYL ) tablet 1,000 mg  Status:  Discontinued       Placed in "And" Linked Group   1,000 mg Oral 3 times per day 05/25/23 0835 05/25/23 0840   05/25/23 0845  cefoTEtan  (CEFOTAN ) 2 g in sodium chloride  0.9 % 100 mL IVPB        2 g 200 mL/hr over 30 Minutes Intravenous On call to O.R. 05/25/23 0835 05/26/23 0809       Objective: Vitals:   06/06/23 0900 06/06/23 0954  BP:    Pulse: 61 77  Resp:    Temp:    SpO2:      Intake/Output Summary (Last 24 hours) at 06/06/2023 1301 Last data filed at 06/06/2023 0846 Gross per 24 hour  Intake 1790.29 ml  Output --  Net 1790.29 ml   Filed Weights   06/04/23 0500 06/05/23 0430 06/06/23 0500  Weight: 73 kg 78.1 kg 81.6 kg   Weight change: 3.5 kg Body mass index is 32.9 kg/m.   Physical Exam: General exam: Pleasant, elderly Caucasian female.  Not in pain Skin: No rashes, lesions or ulcers. HEENT: Atraumatic, normocephalic, no obvious bleeding.  Has NG tube attached to suction as of this morning Lungs: Diminished entry in both bases.  Otherwise clear to auscultation bilaterally.  Not on supplemental oxygen CVS: S1, S2, no murmur,   GI/Abd: Soft, postop tenderness present, distention still present.  Tympanic on percussion, bowel sound present,   CNS: Alert, awake, oriented x 3 Psychiatry: Mood appropriate Extremities: Trace bilateral pedal edema, no calf tenderness,   Data Review: I have personally reviewed the laboratory data and studies available.  F/u labs ordered Unresulted Labs (From admission, onward)     Start     Ordered   06/05/23 0813  CBC  Daily,   R     Question:  Specimen collection method  Answer:  IV Team=IV Team collect   06/05/23 0812   06/05/23 0500  Triglycerides  (Standard TPN Labs (all labs to be drawn at 0500))  Every Monday (0500),   R     Question:  Specimen collection method  Answer:  Lab=Lab collect   05/31/23 0647   06/03/23 0500  Heparin   level (unfractionated)  Daily,   R     Question:  Specimen collection method  Answer:  IV Team=IV Team collect   06/01/23 1827   06/01/23 0500  Comprehensive metabolic panel  (Standard TPN Labs (all labs to be drawn at 0500))  Every Mon,Thu (0500),   R     Question:  Specimen collection method  Answer:  Lab=Lab collect   05/31/23 0647   06/01/23 0500  Magnesium  (Standard TPN Labs (all labs to be drawn at 0500))  Every Mon,Thu (0500),   R     Question:  Specimen collection method  Answer:  Lab=Lab collect   05/31/23 0647   06/01/23 0500  Phosphorus  (Standard TPN Labs (all labs to be drawn at 0500))  Every Mon,Thu (0500),   R     Question:  Specimen collection method  Answer:  Lab=Lab collect   05/31/23 0647            Signed, Hoyt Macleod, MD [facility] 06/06/2023

## 2023-06-06 NOTE — Progress Notes (Signed)
 Progress Note  Patient Name: Dominique Barber Date of Encounter: 06/06/2023  Primary Cardiologist: Randene Bustard, MD   Subjective   Patient seen and examined at her bedside.  She was sitting up in bed eating when I arrived.  She tells me that she is not having any difficulty swallowing just have to eat slowly before she have stomach pain.  Inpatient Medications    Scheduled Meds:  acetaminophen   1,000 mg Oral Q6H   Chlorhexidine  Gluconate Cloth  6 each Topical Daily   digoxin   0.125 mg Intravenous Daily   insulin  aspart  0-9 Units Subcutaneous Q6H   levothyroxine   75 mcg Oral QAC breakfast   multivitamin with minerals  1 tablet Oral Daily   mouth rinse  15 mL Mouth Rinse 4 times per day   potassium chloride   20 mEq Oral Once   Continuous Infusions:  diltiazem  (CARDIZEM ) infusion Stopped (06/06/23 0950)   heparin  850 Units/hr (06/05/23 0912)   TPN ADULT (ION) 80 mL/hr at 06/05/23 1718   TPN ADULT (ION)     PRN Meds: albuterol , diphenhydrAMINE  **OR** diphenhydrAMINE , hydrALAZINE , lidocaine , lip balm, menthol-cetylpyridinium, methocarbamol  (ROBAXIN ) injection, metoprolol  tartrate, nitroGLYCERIN , ondansetron  **OR** ondansetron  (ZOFRAN ) IV, mouth rinse, mouth rinse, phenol, prochlorperazine , simethicone , sodium chloride  flush, traMADol    Vital Signs    Vitals:   06/06/23 0800 06/06/23 0835 06/06/23 0900 06/06/23 0954  BP: 104/61     Pulse: 63 71 61 77  Resp: 20     Temp:      TempSrc:      SpO2: 98%     Weight:      Height:        Intake/Output Summary (Last 24 hours) at 06/06/2023 1132 Last data filed at 06/06/2023 0846 Gross per 24 hour  Intake 2150.29 ml  Output --  Net 2150.29 ml   Filed Weights   06/04/23 0500 06/05/23 0430 06/06/23 0500  Weight: 73 kg 78.1 kg 81.6 kg    Telemetry    Atrial fibrillation with controlled ventricular rate intermittently with less than 3-second pauses- Personally Reviewed  ECG     - Personally Reviewed  Physical Exam    General: Comfortable Head: Atraumatic, normal size  Eyes: PEERLA, EOMI  Neck: Supple, normal JVD Cardiac: Normal S1, S2; RRR; no murmurs, rubs, or gallops Lungs: Clear to auscultation bilaterally Abd: Soft, nontender, no hepatomegaly  Ext: warm, +2 bilateral lower extremity edema Musculoskeletal: No deformities, BUE and BLE strength normal and equal Skin: Warm and dry, no rashes   Neuro: Alert and oriented to person, place, time, and situation, CNII-XII grossly intact, no focal deficits  Psych: Normal mood and affect   Labs    Chemistry Recent Labs  Lab 06/03/23 0453 06/04/23 1103 06/05/23 0219 06/06/23 0900  NA 136 139 136 135  K 3.5 3.9 3.6 3.9  CL 98 99 101 102  CO2 29 30 26 26   GLUCOSE 146* 151* 140* 136*  BUN 20 24* 25* 23  CREATININE 0.86 0.80 1.06* 0.83  CALCIUM  8.0* 8.5* 8.1* 8.1*  PROT 5.7* 5.5* 5.3*  --   ALBUMIN 2.7* 2.7* 2.6*  --   AST 31 26 27   --   ALT 61* 59* 56*  --   ALKPHOS 58 65 70  --   BILITOT 0.5 0.4 0.5  --   GFRNONAA >60 >60 58* >60  ANIONGAP 9 10 9 7      Hematology Recent Labs  Lab 06/03/23 0453 06/05/23 0830 06/06/23 0508  WBC 9.0 14.5*  10.3  RBC 3.30* 3.08* 2.58*  HGB 8.9* 8.3* 7.6*  HCT 29.6* 28.0* 25.3*  MCV 89.7 90.9 98.1  MCH 27.0 26.9 29.5  MCHC 30.1 29.6* 30.0  RDW 18.6* 18.6* 19.7*  PLT 283 270 240    Cardiac EnzymesNo results for input(s): "TROPONINI" in the last 168 hours. No results for input(s): "TROPIPOC" in the last 168 hours.   BNPNo results for input(s): "BNP", "PROBNP" in the last 168 hours.   DDimer No results for input(s): "DDIMER" in the last 168 hours.   Radiology    No results found.   Cardiac Studies     Patient Profile     68 y.o. female wiith STEMI 1998 w/ VF arrest & anoxic brain injury w/ status epilepticus, s/p blow-by w/ BMS LAD 1 mo later. ISR>> CABG 1998 w/ LIMA-LAD & SVG-D1, AMI w/ emergent re-operation w/ SVG-LAD 1998, PTCA/DES to mSVG-LAD 01/2020, prior ICM with normalized LVEF by  echo 03/2023, PAF, pre-DM, HLD, hypothyroidism, COPD, cecal adenocarcinoma admitted for elective right hemicolectomy. Post-op course notable for acute renal insufficiency, post-operative ileus/sluggish transit requiring NPO status, hypokalemia, hy   Assessment & Plan    PAF-she still in A-fib she is having some intermittent pauses which are not significant in the setting of her A-fib.  We will need to rate control the patient.  Therefore I am going to restart her home Toprol  XL 25 mg daily.  Okay with stopping digoxin  as well as Cardizem .  If she is being discharged today please stop the heparin  and restart her Xarelto .   Anemia-hemoglobin 7.6 today.  Heart rate is reasonably controlled, will continue to follow  Coronary artery disease-denies any anginal symptoms.  She is on Repatha  in the outpatient setting.  Lower extremity edema-blood pressure is marginal so be very cautious with using any diuretics at this time.    For questions or updates, please contact [facility] Please consult [email or web address] for contact info under Cardiology/STEMI.      Signed, Christie Viscomi, DO  06/06/2023, 11:32 AM

## 2023-06-06 NOTE — Progress Notes (Signed)
 12 Days Post-Op   Subjective/Chief Complaint: Has had multiple BMs, passing flatus, tolerating soft diet without n/v but does have gas/bm after eating. Incisional soreness.   Objective: Vital signs in last 24 hours: Temp:  [98.2 F (36.8 C)-98.6 F (37 C)] 98.6 F (37 C) (06/03 0536) Pulse Rate:  [61-77] 77 (06/03 0954) Resp:  [17-20] 20 (06/03 0800) BP: (104-120)/(61-85) 104/61 (06/03 0800) SpO2:  [98 %-99 %] 98 % (06/03 0800) Weight:  [81.6 kg] 81.6 kg (06/03 0500) Last BM Date : 06/05/23  Intake/Output from previous day: 06/02 0701 - 06/03 0700 In: 2564.3 [P.O.:1134; I.V.:1430.3] Out: -  Intake/Output this shift: No intake/output data recorded.  General nad Pulm effort normal Rate normal Ab soft, nontender, not significantly distended incision clean  Lab Results:  Recent Labs    06/05/23 0830 06/06/23 0508  WBC 14.5* 10.3  HGB 8.3* 7.6*  HCT 28.0* 25.3*  PLT 270 240   BMET Recent Labs    06/05/23 0219 06/06/23 0900  NA 136 135  K 3.6 3.9  CL 101 102  CO2 26 26  GLUCOSE 140* 136*  BUN 25* 23  CREATININE 1.06* 0.83  CALCIUM  8.1* 8.1*   PT/INR No results for input(s): "LABPROT", "INR" in the last 72 hours. ABG No results for input(s): "PHART", "HCO3" in the last 72 hours.  Invalid input(s): "PCO2", "PO2"  Studies/Results: No results found.   Anti-infectives: Anti-infectives (From admission, onward)    Start     Dose/Rate Route Frequency Ordered Stop   05/25/23 1400  neomycin  (MYCIFRADIN ) tablet 1,000 mg  Status:  Discontinued       Placed in "And" Linked Group   1,000 mg Oral 3 times per day 05/25/23 0835 05/25/23 0840   05/25/23 1400  metroNIDAZOLE  (FLAGYL ) tablet 1,000 mg  Status:  Discontinued       Placed in "And" Linked Group   1,000 mg Oral 3 times per day 05/25/23 0835 05/25/23 0840   05/25/23 0845  cefoTEtan  (CEFOTAN ) 2 g in sodium chloride  0.9 % 100 mL IVPB        2 g 200 mL/hr over 30 Minutes Intravenous On call to O.R. 05/25/23  0835 05/26/23 0809       Assessment/Plan: POD 12 lap right colon -soft diet -ok today to half rate tpn -ambulate 5x/day -ok to begin to transition to PO medications -PPX: SCDs; d/c hep gtt, started home xarelto  for supper time  Melvenia Stabs 06/06/2023

## 2023-06-06 NOTE — Progress Notes (Signed)
 PHARMACY - ANTICOAGULATION CONSULT NOTE  Pharmacy Consult for heparin   Indication: atrial fibrillation (PTA Xarelto  on hold)  Allergies  Allergen Reactions   Iodinated Contrast Media Hives and Dermatitis   Linzess [Linaclotide] Other (See Comments)    Caused horrible stomach pain and gas   Atorvastatin  Other (See Comments)    Gas pain in abdomen with no relief   Other Diarrhea and Other (See Comments)    IBS- Special diet   Milk-Related Compounds Diarrhea and Other (See Comments)    Has IBS   Rosuvastatin  Calcium  Other (See Comments)    Worsening GI upset   Simvastatin Other (See Comments)    Myalgia & GI upset   Eluxadoline Nausea And Vomiting and Other (See Comments)    Viberzi - stomach pain   Iohexol  Hives, Rash and Other (See Comments)   Marinol [Dronabinol] Palpitations and Other (See Comments)    "Made me feel high and almost passed out"   Red Dye #40 (Allura Red) Hives and Rash    Red contrast dye     Patient Measurements: Height: 5\' 2"  (157.5 cm) Weight: 81.6 kg (179 lb 14.3 oz) IBW/kg (Calculated) : 50.1 HEPARIN  DW (KG): 67.3  Vital Signs: Temp: 98.6 F (37 C) (06/03 0536) Temp Source: Oral (06/03 0536) BP: 104/61 (06/03 0800) Pulse Rate: 63 (06/03 0800)  Labs: Recent Labs    06/04/23 0240 06/04/23 1103 06/05/23 0219 06/05/23 0830 06/06/23 0508  HGB  --   --   --  8.3* 7.6*  HCT  --   --   --  28.0* 25.3*  PLT  --   --   --  270 240  HEPARINUNFRC 0.40  --  0.40  --  0.25*  CREATININE  --  0.80 1.06*  --   --     Estimated Creatinine Clearance: 51 mL/min (A) (by C-G formula based on SCr of 1.06 mg/dL (H)).   Medical History: Past Medical History:  Diagnosis Date   ACS (acute coronary syndrome) (HCC) 01/06/2020   With progressive angina while troponin elevation--borderline-NON-STEMI   ANTERIOR STEMI of LAD (x2). 01/19/1996   Complicated by VF arrest, and anoxic brain injury with status epilepticus; POBA of LAD --> 6 months later, after 2  vessel CABG she had postop complication with occluded LIMA/second anterior STEMI   CAD in native artery & Grafts 01/1996   a) 1/'98: Ant STEMI => LAD POBA; b) 2/'98: 95% prox LAD @ POBA site --> BMS PCI (3.0 mm x 25 mm); c) 7/98 (5 months later) => BMS ISR of LAD--> CABGx2 (LIMA-LAD, SVG-D1 -> Emergent redo w/ SVG-LAD b/c acute LIMA occlusion); d) 01/2020: ACS -> DES PCI of SVG-LAD (75%&85%), TO of SVG-D1.   COPD (chronic obstructive pulmonary disease) (HCC)    Dyspnea    GERD (gastroesophageal reflux disease)    Headache    Hiatal hernia    Hyperlipidemia    Hypertension    Hypothyroidism    On Synthroid    Iron deficiency anemia    Irritable bowel syndrome    Followed by Dr. Tami Falcon.   Ischemic cardiomyopathy 01/03/2020   ECHO (ACS): EF 45-50% (by Cath EF 40-45%) - Apical Inferior/Apical Anteroseptal & Apex akinetic, GR II DD.    PAF (paroxysmal atrial fibrillation) (HCC) 05/03/2014   New onset - originally with RVR   Pneumonia    x 2   PONV (postoperative nausea and vomiting)    very gag reflex   S/P CABG x 2; with EMERGENT Redo  x 1. 07/1996   Initially LIMA-LAD, SVG-D1 --> immediate LIMA failure post CABG with anterior MI --> urgent redo SVG-LAD beyond D2.; Widely patent grafts as of 2005 with left dominant system. Graft to the diagonal branch has a very small target vessel but the vein graft to LAD retrograde fills a large bifurcating D2 and antegrade fills a large D3.   Tobacco abuse     Medications:  -Xarelto  20 mg daily PTA  Assessment: Patient is a 68 y.o with hx afib on Xarelto  PTA and was recently diagnosed with colon cancer who presented to [facility] on 05/25/23 for right hemicolectomy.  She underwent lap right hemicolectomy on 05/25/23 with heparin  drip started post-op on 05/28/23.  Today, 06/06/2023: Heparin  level = 0.38 remains therapeutic on heparin  infusion of 850 units/hr Concern for erroneous result from AM level drawn at 05:08, redrawn at 09:00 CBC: Hgb low, Plt  WNL No line issues/interruptions. No bleeding reported.   Goal of Therapy:  Heparin  level 0.3-0.7 units/ml Monitor platelets by anticoagulation protocol: Yes   Plan:  Continue heparin  at 850 units/hr CBC, heparin  level daily Monitor for signs of bleeding  Shireen Dory, PharmD, BCPS  Clinical Pharmacist 06/06/2023 8:36 AM

## 2023-06-06 NOTE — Progress Notes (Signed)
 Cardizem  drip stopped per order parameters. Pause >2 seconds. Charge RN Arlice Bene aware & MD Dahal & MD Tobb K. (Cardiology) paged.

## 2023-06-07 LAB — CBC
HCT: 26.1 % — ABNORMAL LOW (ref 36.0–46.0)
Hemoglobin: 7.7 g/dL — ABNORMAL LOW (ref 12.0–15.0)
MCH: 27.1 pg (ref 26.0–34.0)
MCHC: 29.5 g/dL — ABNORMAL LOW (ref 30.0–36.0)
MCV: 91.9 fL (ref 80.0–100.0)
Platelets: 258 10*3/uL (ref 150–400)
RBC: 2.84 MIL/uL — ABNORMAL LOW (ref 3.87–5.11)
RDW: 18.3 % — ABNORMAL HIGH (ref 11.5–15.5)
WBC: 9.3 10*3/uL (ref 4.0–10.5)
nRBC: 0.2 % (ref 0.0–0.2)

## 2023-06-07 LAB — C DIFFICILE (CDIFF) QUICK SCRN (NO PCR REFLEX)
C Diff antigen: NEGATIVE
C Diff interpretation: NOT DETECTED
C Diff toxin: NEGATIVE

## 2023-06-07 LAB — GLUCOSE, CAPILLARY
Glucose-Capillary: 127 mg/dL — ABNORMAL HIGH (ref 70–99)
Glucose-Capillary: 142 mg/dL — ABNORMAL HIGH (ref 70–99)
Glucose-Capillary: 156 mg/dL — ABNORMAL HIGH (ref 70–99)
Glucose-Capillary: 159 mg/dL — ABNORMAL HIGH (ref 70–99)
Glucose-Capillary: 90 mg/dL (ref 70–99)
Glucose-Capillary: 95 mg/dL (ref 70–99)

## 2023-06-07 MED ORDER — FUROSEMIDE 10 MG/ML IJ SOLN
20.0000 mg | Freq: Once | INTRAMUSCULAR | Status: AC
  Administered 2023-06-07: 20 mg via INTRAVENOUS
  Filled 2023-06-07: qty 2

## 2023-06-07 MED ORDER — INSULIN ASPART 100 UNIT/ML IJ SOLN: 0.0000 [IU] | Freq: Three times a day (TID) | INTRAMUSCULAR | Status: DC

## 2023-06-07 MED ORDER — METOPROLOL SUCCINATE ER 50 MG PO TB24
25.0000 mg | ORAL_TABLET | Freq: Two times a day (BID) | ORAL | Status: DC
  Administered 2023-06-07 – 2023-06-08 (×2): 25 mg via ORAL
  Filled 2023-06-07 (×2): qty 1

## 2023-06-07 MED ORDER — LOPERAMIDE HCL 2 MG PO CAPS
2.0000 mg | ORAL_CAPSULE | Freq: Every day | ORAL | Status: DC
  Administered 2023-06-07: 2 mg via ORAL
  Filled 2023-06-07 (×2): qty 1

## 2023-06-07 NOTE — Progress Notes (Signed)
 Dominique Barber  06/07/2023  Dominique Barber 1955/04/13 [id number]  Location: RN Hospital Liaison screened the patient remotely at [facility].  Insurance: Medicare   Dominique Barber is a 68 y.o. female who is a Primary Care Patient of Abraham Abo, MD [facility]. The patient was screened for  readmission hospitalization with noted high risk score for unplanned readmission risk with 1 IP/1 ED in 6 months.  The patient was assessed for potential Care Management service needs for post hospital transition for care coordination. Review of patient's electronic medical record reveals patient was admitted for S/P Partial Colectomy. Pt will transition to home with self-care when medical stable.  VBCI listed for post hospital transition of care follow up calls.   Plan: [facility] Liaison will continue to follow progress and disposition to asess for post hospital community care coordination/management needs.  Referral request for community care coordination: anticipate Transitions of Care Team follow up.   VBCI Care Management/Population Health does not replace or interfere with any arrangements made by the Inpatient Transition of Care team.   For questions contact:   Lilla Reichert, RN, BSN Hospital Liaison [place]   [facility], Population Health Office Hours MTWF  8:00 am-6:00 pm Direct Dial: [phone] mobile [email or web address]

## 2023-06-07 NOTE — Progress Notes (Signed)
 Subjective No acute events. Doing better, although multiple watery bms - 5 overnight. No n/v. Toelrating PO, appetite picking up  Objective: Vital signs in last 24 hours: Temp:  [97.6 F (36.4 C)-98.2 F (36.8 C)] 98.2 F (36.8 C) (06/04 0515) Pulse Rate:  [77-106] 98 (06/04 0851) Resp:  [18-19] 19 (06/04 0017) BP: (126-159)/(87-104) 134/94 (06/04 0851) SpO2:  [95 %-98 %] 98 % (06/04 0017) Weight:  [75.3 kg] 75.3 kg (06/04 0500) Last BM Date : 06/05/23  Intake/Output from previous day: 06/03 0701 - 06/04 0700 In: 1424.3 [P.O.:720; I.V.:704.3] Out: -  Intake/Output this shift: No intake/output data recorded.  Gen: NAD, comfortable CV: RRR Pulm: Normal work of breathing Abd: Soft, NT/ND  Ext: SCDs in place  Lab Results: CBC  Recent Labs    06/06/23 0508 06/07/23 0133  WBC 10.3 9.3  HGB 7.6* 7.7*  HCT 25.3* 26.1*  PLT 240 258   BMET Recent Labs    06/05/23 0219 06/06/23 0900  NA 136 135  K 3.6 3.9  CL 101 102  CO2 26 26  GLUCOSE 140* 136*  BUN 25* 23  CREATININE 1.06* 0.83  CALCIUM  8.1* 8.1*   PT/INR No results for input(s): "LABPROT", "INR" in the last 72 hours. ABG No results for input(s): "PHART", "HCO3" in the last 72 hours.  Invalid input(s): "PCO2", "PO2"  Studies/Results:  Anti-infectives: Anti-infectives (From admission, onward)    Start     Dose/Rate Route Frequency Ordered Stop   05/25/23 1400  neomycin  (MYCIFRADIN ) tablet 1,000 mg  Status:  Discontinued       Placed in "And" Linked Group   1,000 mg Oral 3 times per day 05/25/23 0835 05/25/23 0840   05/25/23 1400  metroNIDAZOLE  (FLAGYL ) tablet 1,000 mg  Status:  Discontinued       Placed in "And" Linked Group   1,000 mg Oral 3 times per day 05/25/23 0835 05/25/23 0840   05/25/23 0845  cefoTEtan  (CEFOTAN ) 2 g in sodium chloride  0.9 % 100 mL IVPB        2 g 200 mL/hr over 30 Minutes Intravenous On call to O.R. 05/25/23 0835 05/26/23 0809        Assessment/Plan: Patient Active  Problem List   Diagnosis Date Noted   Prediabetes 05/26/2023   Hyperglycemia 05/26/2023   S/P partial colectomy 05/25/2023   Myalgia due to statin 04/12/2023   Adenocarcinoma of cecum (HCC) 04/12/2023   Preop cardiovascular exam 04/12/2023   Hypokalemia 04/12/2023   IBS (irritable bowel syndrome) 04/12/2023   Hypercoagulable state due to paroxysmal atrial fibrillation (HCC) 12/12/2021   Uterine fibroid 12/23/2020   Abdominal bloating 04/15/2020   Coronary artery disease involving autologous vein coronary bypass graft with unstable angina pectoris (HCC) 01/08/2020   Ischemic cardiomyopathy 01/03/2020   Atrial fibrillation with rapid ventricular response (HCC) 01/02/2020   Primary hypertension 06/14/2019   Persistent atrial fibrillation (HCC) 05/14/2014   PAF (paroxysmal atrial fibrillation) (HCC) 05/04/2014   COPD (chronic obstructive pulmonary disease) (HCC) 05/04/2014   Hyperlipidemia with target low density lipoprotein (LDL) cholesterol less than 55 mg/dL    Tobacco abuse    Hypothyroid 12/03/2012   Multinodular goiter 12/03/2012   Coronary artery disease involving native coronary artery of native heart 11/14/1996    Class: Chronic   S/P CABG x 2 07/03/1996   History of ST elevation (STEMI) myocardial infarction involving left anterior descending coronary artery (HCC) 01/19/1996   POD 13 lap right colon -soft diet -check c diff; if negative, will start  lower dose imodium -wean off tpn -ambulate 5x/day -ok to begin to transition to PO medications -PPX: SCDs; home xarelto   Beatris Lincoln, MD [facility]

## 2023-06-07 NOTE — TOC Progression Note (Signed)
 Transition of Care [facility]) - Progression Note    Patient Details  Name: Dominique Barber MRN: [id number] Date of Birth: 1955/05/10  Transition of [facility]) CM/SW Contact  Emoni Yang, Thersia Flax, RN Phone Number: 06/07/2023, 11:36 AM  Clinical Narrative: Noted TPN d/c, advancing diet. D/c plan home.      Expected Discharge Plan: Home/Self Care Barriers to Discharge: Continued Medical Work up  Expected Discharge Plan and Services   Discharge Planning Services: CM Consult   Living arrangements for the past 2 months: Single Family Home                                       Social Determinants of Health (SDOH) Interventions SDOH Screenings   Food Insecurity: No Food Insecurity (05/26/2023)  Housing: Low Risk  (05/26/2023)  Transportation Needs: No Transportation Needs (05/26/2023)  Utilities: Not At Risk (05/26/2023)  Alcohol Screen: Low Risk  (03/28/2023)  Depression (PHQ2-9): Low Risk  (03/28/2023)  Financial Resource Strain: Low Risk  (11/24/2022)  Physical Activity: Insufficiently Active (11/24/2022)  Social Connections: Unknown (05/25/2023)  Stress: No Stress Concern Present (11/24/2022)  Tobacco Use: Medium Risk (05/25/2023)  Health Literacy: Adequate Health Literacy (03/28/2023)    Readmission Risk Interventions    05/26/2023    4:20 PM  Readmission Risk Prevention Plan  Transportation Screening Complete  PCP or Specialist Appt within 5-7 Days Complete  Home Care Screening Complete  Medication Review (RN CM) Complete

## 2023-06-07 NOTE — Progress Notes (Signed)
 Progress Note  Patient Name: Dominique Barber Date of Encounter: 06/07/2023  Primary Cardiologist: Randene Bustard, MD   Subjective   Patient seen and examined at her bedside.  She was sitting up in the chair when I arrived. Offered no complaints.   Inpatient Medications    Scheduled Meds:  acetaminophen   1,000 mg Oral Q6H   Chlorhexidine  Gluconate Cloth  6 each Topical Daily   insulin  aspart  0-9 Units Subcutaneous TID WC   levothyroxine   75 mcg Oral QAC breakfast   metoprolol  succinate  25 mg Oral Daily   multivitamin with minerals  1 tablet Oral Daily   mouth rinse  15 mL Mouth Rinse 4 times per day   potassium chloride   20 mEq Oral Once   rivaroxaban   20 mg Oral Q supper   Continuous Infusions:  TPN ADULT (ION) 80 mL/hr at 06/06/23 1800   PRN Meds: albuterol , diphenhydrAMINE  **OR** [DISCONTINUED] diphenhydrAMINE , hydrALAZINE , lidocaine , lip balm, menthol-cetylpyridinium, methocarbamol  (ROBAXIN ) injection, nitroGLYCERIN , ondansetron  **OR** ondansetron  (ZOFRAN ) IV, mouth rinse, mouth rinse, phenol, prochlorperazine , simethicone , sodium chloride  flush, traMADol    Vital Signs    Vitals:   06/07/23 0017 06/07/23 0500 06/07/23 0515 06/07/23 0851  BP: (!) 159/93   (!) 134/94  Pulse: (!) 106   98  Resp: 19     Temp: 97.6 F (36.4 C)  98.2 F (36.8 C)   TempSrc: Oral  Oral   SpO2: 98%     Weight:  75.3 kg    Height:        Intake/Output Summary (Last 24 hours) at 06/07/2023 1011 Last data filed at 06/07/2023 0300 Gross per 24 hour  Intake 1304.28 ml  Output --  Net 1304.28 ml   Filed Weights   06/05/23 0430 06/06/23 0500 06/07/23 0500  Weight: 78.1 kg 81.6 kg 75.3 kg    Telemetry    Atrial fibrillation with intermittent rvr -  Personally Reviewed  ECG     - Personally Reviewed  Physical Exam   General: Comfortable Head: Atraumatic, normal size  Eyes: PEERLA, EOMI  Neck: Supple, normal JVD Cardiac: Normal S1, S2; RRR; no murmurs, rubs, or  gallops Lungs: Clear to auscultation bilaterally Abd: Soft, nontender, no hepatomegaly  Ext: warm, +2 bilateral lower extremity edema Musculoskeletal: No deformities, BUE and BLE strength normal and equal Skin: Warm and dry, no rashes   Neuro: Alert and oriented to person, place, time, and situation, CNII-XII grossly intact, no focal deficits  Psych: Normal mood and affect   Labs    Chemistry Recent Labs  Lab 06/03/23 0453 06/04/23 1103 06/05/23 0219 06/06/23 0900  NA 136 139 136 135  K 3.5 3.9 3.6 3.9  CL 98 99 101 102  CO2 29 30 26 26   GLUCOSE 146* 151* 140* 136*  BUN 20 24* 25* 23  CREATININE 0.86 0.80 1.06* 0.83  CALCIUM  8.0* 8.5* 8.1* 8.1*  PROT 5.7* 5.5* 5.3*  --   ALBUMIN 2.7* 2.7* 2.6*  --   AST 31 26 27   --   ALT 61* 59* 56*  --   ALKPHOS 58 65 70  --   BILITOT 0.5 0.4 0.5  --   GFRNONAA >60 >60 58* >60  ANIONGAP 9 10 9 7      Hematology Recent Labs  Lab 06/05/23 0830 06/06/23 0508 06/07/23 0133  WBC 14.5* 10.3 9.3  RBC 3.08* 2.58* 2.84*  HGB 8.3* 7.6* 7.7*  HCT 28.0* 25.3* 26.1*  MCV 90.9 98.1 91.9  MCH 26.9 29.5  27.1  MCHC 29.6* 30.0 29.5*  RDW 18.6* 19.7* 18.3*  PLT 270 240 258    Cardiac EnzymesNo results for input(s): "TROPONINI" in the last 168 hours. No results for input(s): "TROPIPOC" in the last 168 hours.   BNPNo results for input(s): "BNP", "PROBNP" in the last 168 hours.   DDimer No results for input(s): "DDIMER" in the last 168 hours.   Radiology    No results found.   Cardiac Studies   Echo   Patient Profile     68 y.o. female wiith STEMI 1998 w/ VF arrest & anoxic brain injury w/ status epilepticus, s/p blow-by w/ BMS LAD 1 mo later. ISR>> CABG 1998 w/ LIMA-LAD & SVG-D1, AMI w/ emergent re-operation w/ SVG-LAD 1998, PTCA/DES to mSVG-LAD 01/2020, prior ICM with normalized LVEF by echo 03/2023, PAF, pre-DM, HLD, hypothyroidism, COPD, cecal adenocarcinoma admitted for elective right hemicolectomy. Post-op course notable for acute  renal insufficiency, post-operative ileus/sluggish transit requiring NPO status, hypokalemia, hy   Assessment & Plan    PAF-she still in A-fib, her rate is not well controlled. She is currently on Toprol  xl 25 mg daily - she is on twice a day at home will adjust to her home dose.  We will need to rate control the patient.  Therefore I am going to restart her home Toprol  XL 25 mg daily.  Continue her Xarelto .   Anemia-hemoglobin 7.7 today.   Coronary artery disease-denies any anginal symptoms.  She is on Repatha  in the outpatient setting.  Lower extremity edema-will give one time dose of Lasix.    For questions or updates, please contact [facility] Please consult [email or web address] for contact info under Cardiology/STEMI.      Signed, Antigone Crowell, DO  06/07/2023, 10:11 AM

## 2023-06-07 NOTE — Progress Notes (Signed)
 Physical Therapy Treatment Patient Details Name: Dominique Barber MRN: [id number] DOB: 10/19/1955 Today's Date: 06/07/2023   History of Present Illness 68 yo female S/P laparoscopic right COLECTOMY and LYSIS of ADHESIONS on 5/22. 6/1: remain with NG tube however clamped today; Remains on IV heparin  drip and IV Cardizem  drip - cardiology on board. PMH:CAD, CABG,PAF,ACS,COPD,IBS, GERD, Hypothyroid, HH, HTN.    PT Comments  Pt agreeable to working with therapy. HR up to 140s with ambulation.     If plan is discharge home, recommend the following: Assistance with cooking/housework;Assist for transportation;Help with stairs or ramp for entrance;A little help with bathing/dressing/bathroom   Can travel by private vehicle        Equipment Recommendations  None recommended by PT    Recommendations for Other Services       Precautions / Restrictions Precautions Precautions: Fall Precaution/Restrictions Comments: abd sg Restrictions Weight Bearing Restrictions Per Provider Order: No     Mobility  Bed Mobility               General bed mobility comments: pt in recliner    Transfers Overall transfer level: Needs assistance Equipment used: Rolling walker (2 wheels) Transfers: Sit to/from Stand Sit to Stand: Supervision                Ambulation/Gait Ambulation/Gait assistance: Supervision Gait Distance (Feet): 400 Feet Assistive device: Rolling walker (2 wheels) Gait Pattern/deviations: Step-through pattern, Decreased stride length       General Gait Details: HR up to 140s with ambulation intermittently. Fair gait speed.   Stairs             Wheelchair Mobility     Tilt Bed    Modified Rankin (Stroke Patients Only)       Balance Overall balance assessment: Mild deficits observed, not formally tested                                          Communication Communication Communication: No apparent difficulties  Cognition  Arousal: Alert Behavior During Therapy: WFL for tasks assessed/performed   PT - Cognitive impairments: No apparent impairments                       PT - Cognition Comments: pleasant and motivated Following commands: Intact      Cueing Cueing Techniques: Verbal cues  Exercises      General Comments        Pertinent Vitals/Pain Pain Assessment Pain Assessment: Faces Faces Pain Scale: Hurts little more Pain Location: right lower abdomen Pain Descriptors / Indicators: Sharp, Sore Pain Intervention(s): Monitored during session    Home Living                          Prior Function            PT Goals (current goals can now be found in the care plan section) Progress towards PT goals: Progressing toward goals    Frequency    Min 2X/week      PT Plan      Co-evaluation              AM-PAC PT "6 Clicks" Mobility   Outcome Measure  Help needed turning from your back to your side while in a flat bed without using bedrails?: None Help needed moving from lying  on your back to sitting on the side of a flat bed without using bedrails?: A Little Help needed moving to and from a bed to a chair (including a wheelchair)?: A Little Help needed standing up from a chair using your arms (e.g., wheelchair or bedside chair)?: A Little Help needed to walk in hospital room?: A Little Help needed climbing 3-5 steps with a railing? : A Little 6 Click Score: 19    End of Session   Activity Tolerance: Patient tolerated treatment well Patient left: in chair;with call bell/phone within reach;with nursing/sitter in room   PT Visit Diagnosis: Difficulty in walking, not elsewhere classified (R26.2)     Time: 1610-9604 PT Time Calculation (min) (ACUTE ONLY): 25 min  Charges:    $Gait Training: 23-37 mins PT General Charges $$ ACUTE PT VISIT: 1 Visit                         Tanda Falter, PT Acute Rehabilitation  Office: [phone]

## 2023-06-07 NOTE — Progress Notes (Signed)
 PHARMACY - TOTAL PARENTERAL NUTRITION CONSULT NOTE   Indication: Prolonged ileus  Patient Measurements: Height: 5\' 2"  (157.5 cm) Weight: 75.3 kg (166 lb 0.1 oz) IBW/kg (Calculated) : 50.1 TPN AdjBW (KG): 57.1 Body mass index is 30.36 kg/m.  Assessment: Patient is a 68 y.o F with a recent diagnosis of colon cancer who presented to [facility] on 05/25/23 for right hemicolectomy. Pharmacy has been consulted on 05/31/23 to start TPN for post-op ileus.  Glucose / Insulin : A1c 6.3; no hx DM - CBGs (goal <180): 128-156 - Past 24 hr insulin  aspart usage: 5 units Electrolytes: All WNL, including CorrCa (9.2). NNL today. Renal: SCr/BUN decreased to WNL Hepatic: Alk Phos, Tbili, AST WNL. ALT slightly elevated -Albumin low Intake / Output; MIVF: no mIVF -UOP: 6x, stool x6, NG removed -PO intake 720 mL GI Imaging: - 5/24 and Xray: Mildly dilated loops of small bowel.This is nonspecific and could represent ileus or partial small bowel obstruction. - 5/26 abd Xray: Persistent small bowel dilatation  - 5/28 abd CT: Possible small-bowel obstruction with transition point in the right anterior abdomen. Primary differential consideration is enteritis with ileus GI Surgeries / Procedures:  - 5/22: Laparoscopic right hemicolectomy   Central access: Double lumen PICC placed 5/28 TPN start date: 5/28  Nutritional Goals: Goal TPN rate is 80 mL/hr (provides 94 g of protein and 1959 kcals per day)  RD Assessment:  Estimated Needs Total Energy Estimated Needs: 1800-2000 Total Protein Estimated Needs: 90-100 grams Total Fluid Estimated Needs: >1.8 L/day  Current Nutrition:  Soft diet, TPN  Plan:  Received orders from [facility] to stop TPN today. Will reduce rate of TPN to 40 mL/hr and infuse for 2 hours then stop. D/w RN.   Will change SSI to sSSI TID AC.  Pharmacy to sign off and d/c all TPN related labs and monitoring orders.   Shireen Dory, PharmD 06/07/23 9:44 AM

## 2023-06-08 ENCOUNTER — Other Ambulatory Visit: Payer: Medicare Other

## 2023-06-08 LAB — GLUCOSE, CAPILLARY
Glucose-Capillary: 91 mg/dL (ref 70–99)
Glucose-Capillary: 129 mg/dL — ABNORMAL HIGH (ref 70–99)

## 2023-06-08 MED ORDER — LOPERAMIDE HCL 2 MG PO CAPS: 2.0000 mg | ORAL_CAPSULE | Freq: Every day | ORAL | 0 refills | Status: AC

## 2023-06-08 NOTE — Progress Notes (Signed)
 Patient had 8 beat runs [facility] @ 0529, no complaints voiced, asymptomatic. Vital signs below. Will continue to monitor. Will endorse to AM Nurse.   06/08/23 0552  Vitals  BP 118/80  MAP (mmHg) 93  Pulse Rate 66  Resp 17  MEWS COLOR  MEWS Score Color Green  Oxygen Therapy  SpO2 96 %  O2 Device Room [id number]

## 2023-06-08 NOTE — Discharge Summary (Signed)
 Patient ID: Dominique Barber MRN: [id number] DOB/AGE: [birth year] 68 y.o.  Admit date: 05/25/2023 Discharge date: 06/08/2023  Discharge Diagnoses Patient Active Problem List   Diagnosis Date Noted   Prediabetes 05/26/2023   Hyperglycemia 05/26/2023   S/P partial colectomy 05/25/2023   Myalgia due to statin 04/12/2023   Adenocarcinoma of cecum (HCC) 04/12/2023   Preop cardiovascular exam 04/12/2023   Hypokalemia 04/12/2023   IBS (irritable bowel syndrome) 04/12/2023   Hypercoagulable state due to paroxysmal atrial fibrillation (HCC) 12/12/2021   Uterine fibroid 12/23/2020   Abdominal bloating 04/15/2020   Coronary artery disease involving autologous vein coronary bypass graft with unstable angina pectoris (HCC) 01/08/2020   Ischemic cardiomyopathy 01/03/2020   Atrial fibrillation with rapid ventricular response (HCC) 01/02/2020   Primary hypertension 06/14/2019   Persistent atrial fibrillation (HCC) 05/14/2014   PAF (paroxysmal atrial fibrillation) (HCC) 05/04/2014   COPD (chronic obstructive pulmonary disease) (HCC) 05/04/2014   Hyperlipidemia with target low density lipoprotein (LDL) cholesterol less than 55 mg/dL    Tobacco abuse    Hypothyroid 12/03/2012   Multinodular goiter 12/03/2012   Coronary artery disease involving native coronary artery of native heart 11/14/1996   S/P CABG x 2 07/03/1996   History of ST elevation (STEMI) myocardial infarction involving left anterior descending coronary artery (HCC) 01/19/1996    Consultants TRH & Cardiology  Procedures 05/25/23 - laparoscopic right hemicolectomy  Hospital Course: She was admitted following surgery. TRH and cardiology was involved in managing medical comorbidities and afib. Her diet was initially advanced but she developed a postoperative ileus, ultimately requiring NG tube and TPN for nutritional support. CT Abd/pelvis did not demonstrate any acute contributing factors aside from expected findings in setting of  ileus. She began to have return of bowel fxn, ng was removed and diet reinstated. She tolerated this well. She was transitioned back onto her home oral anticoagulation. On 06/08/23, she is motivated to go home and stable for this. Expectations were reviewed as was pathology. Follow-up arranged. See specific notes from TRH and cardiology    Allergies as of 06/08/2023       Reactions   Iodinated Contrast Media Hives, Dermatitis   Linzess [linaclotide] Other (See Comments)   Caused horrible stomach pain and gas   Atorvastatin  Other (See Comments)   Gas pain in abdomen with no relief   Other Diarrhea, Other (See Comments)   IBS- Special diet   Milk-related Compounds Diarrhea, Other (See Comments)   Has IBS   Rosuvastatin  Calcium  Other (See Comments)   Worsening GI upset   Simvastatin Other (See Comments)   Myalgia & GI upset   Eluxadoline Nausea And Vomiting, Other (See Comments)   Viberzi - stomach pain   Iohexol  Hives, Rash, Other (See Comments)   Marinol [dronabinol] Palpitations, Other (See Comments)   "Made me feel high and almost passed out"   Red Dye #40 (allura Red) Hives, Rash   Red contrast dye         Medication List     TAKE these medications    acetaminophen  500 MG tablet Commonly known as: TYLENOL  Take 500 mg by mouth every 6 (six) hours as needed for mild pain.   albuterol  108 (90 Base) MCG/ACT inhaler Commonly known as: VENTOLIN  HFA Inhale 1-2 puffs into the lungs every 6 (six) hours as needed for wheezing or shortness of breath.   Align 4 MG Caps Take 4 mg by mouth in the morning.   amiodarone  200 MG tablet Commonly known as: PACERONE  Take  2 tablets (400 mg total) by mouth daily for 7 days, THEN 1 tablet (200 mg total) daily for 7 days. Start 400 mg on 05/18/23  and start the 200 mg  after surgery for one week  then stop.. Start taking on: May 15, 2023   BEANO PO Take 2 capsules by mouth See admin instructions. Take 2 capsules after lunch and dinner    CALCIUM +D3 PO Take 1 tablet by mouth at bedtime.   cetirizine  10 MG tablet Commonly known as: ZYRTEC  TAKE 1 TABLET DAILY   IBGARD PO Take 1-2 tablets by mouth See admin instructions. Take 2 after lunch and 1 tablet after supper, may take a 1 tablet dose as needed for IBS symptoms   levothyroxine  50 MCG tablet Commonly known as: SYNTHROID  TAKE ONE AND ONE-HALF TABLETS DAILY BEFORE BREAKFAST What changed:  how much to take how to take this when to take this additional instructions   loperamide 2 MG capsule Commonly known as: IMODIUM Take 1 capsule (2 mg total) by mouth daily.   methocarbamol  500 MG tablet Commonly known as: ROBAXIN  Take 1 tablet (500 mg total) by mouth every 8 (eight) hours as needed for muscle spasms.   metoprolol  succinate 25 MG 24 hr tablet Commonly known as: TOPROL -XL TAKE 1 TABLET TWICE A DAY (DISCONTINUE 50 MG)   nitroGLYCERIN  0.4 MG SL tablet Commonly known as: NITROSTAT  Place 1 tablet (0.4 mg total) under the tongue every 5 (five) minutes x 3 doses as needed for chest pain.   polyethylene glycol powder 17 GM/SCOOP powder Commonly known as: GLYCOLAX /MIRALAX  Take 12.75 g by mouth in the morning.   potassium chloride  10 MEQ tablet Commonly known as: KLOR-CON  Take 10 mEq by mouth daily.   potassium chloride  SA 20 MEQ tablet Commonly known as: KLOR-CON  M Take 1 tablet (20 mEq total) by mouth daily.   Repatha  SureClick 140 MG/ML Soaj Generic drug: Evolocumab  Inject 140 mg into the skin every 14 (fourteen) days.   Simethicone  125 MG Tabs Take 125-250 mg by mouth See admin instructions. Take 250 mg by mouth after lunch and 125 mg after each additional meal   Xarelto  20 MG Tabs tablet Generic drug: rivaroxaban  TAKE 1 TABLET DAILY WITH SUPPER       ASK your doctor about these medications    traMADol  50 MG tablet Commonly known as: Ultram  Take 1 tablet (50 mg total) by mouth every 6 (six) hours as needed for up to 5 days (postop pain not  controlled with tylenol /ibuprofen  first). Ask about: Should I take this medication?          Follow-up Information     Melvenia Stabs, MD Follow up on 06/19/2023.   Specialties: General Surgery, Colon and Rectal Surgery Why: Please arrive by 10:00 am Contact information: [address] [place] [address] [phone]                 Renford Cartwright. Camilo Cella, M.D. [facility]

## 2023-06-08 NOTE — Progress Notes (Signed)
 Tramadol  pulld w/ am meds & patient changed mind-med wasted in stericycle w/ Walterine Gunther, Charge RN

## 2023-06-08 NOTE — Progress Notes (Signed)
 AVS reviewed w/ patient who verbalized an understanding. No other questions at this time. PICC line removed earlier by IV team. Pt removed from tele monitor- central tele notified via phone by this RN. Pt's ride enroute with clothes.

## 2023-06-08 NOTE — Progress Notes (Signed)
 PICC Removal Note: PICC line removed from RUE. PICC catheter tip visualized and intact. Pressure dressing applied. No redness, ecchymosis, edema, swelling, or drainage noted at site. Instructions provided on post PICC discharge care, including followup notification instructions.

## 2023-06-08 NOTE — TOC Transition Note (Signed)
 Transition of Care [facility]) - Discharge Note   Patient Details  Name: Dominique Barber MRN: [id number] Date of Birth: 04-19-1955  Transition of Care [facility]) CM/SW Contact:  Ruben Corolla, RN Phone Number: 06/08/2023, 9:54 AM   Clinical Narrative:  d/c plan home.     Final next level of care: Home/Self Care Barriers to Discharge: No Barriers Identified   Patient Goals and CMS Choice Patient states their goals for this hospitalization and ongoing recovery are:: Home CMS Medicare.gov Compare Post Acute Care list provided to:: Patient Choice offered to / list presented to : Patient [place] ownership interest in [facility].provided to:: Patient    Discharge Placement                       Discharge Plan and Services Additional resources added to the After Visit Summary for     Discharge Planning Services: CM Consult                                 Social Drivers of Health (SDOH) Interventions SDOH Screenings   Food Insecurity: No Food Insecurity (05/26/2023)  Housing: Low Risk  (05/26/2023)  Transportation Needs: No Transportation Needs (05/26/2023)  Utilities: Not At Risk (05/26/2023)  Alcohol Screen: Low Risk  (03/28/2023)  Depression (PHQ2-9): Low Risk  (03/28/2023)  Financial Resource Strain: Low Risk  (11/24/2022)  Physical Activity: Insufficiently Active (11/24/2022)  Social Connections: Unknown (05/25/2023)  Stress: No Stress Concern Present (11/24/2022)  Tobacco Use: Medium Risk (05/25/2023)  Health Literacy: Adequate Health Literacy (03/28/2023)     Readmission Risk Interventions    05/26/2023    4:20 PM  Readmission Risk Prevention Plan  Transportation Screening Complete  PCP or Specialist Appt within 5-7 Days Complete  Home Care Screening Complete  Medication Review (RN CM) Complete

## 2023-06-08 NOTE — Progress Notes (Signed)
 Subjective Doing well. No n/v. Tolerating PO including solids. Having flatus/bMs but better on imodium now.   Objective: Vital signs in last 24 hours: Temp:  [97.9 F (36.6 C)-98.2 F (36.8 C)] 97.9 F (36.6 C) (06/05 0510) Pulse Rate:  [39-98] 80 (06/05 0553) Resp:  [16-18] 17 (06/05 0552) BP: (113-134)/(80-94) 118/80 (06/05 0552) SpO2:  [96 %-98 %] 96 % (06/05 0553) Weight:  [75.3 kg] 75.3 kg (06/05 0500) Last BM Date : 06/08/23  Intake/Output from previous day: 06/04 0701 - 06/05 0700 In: 1374.2 [P.O.:720; I.V.:654.2] Out: -  Intake/Output this shift: No intake/output data recorded.  Gen: NAD, comfortable CV: RRR Pulm: Normal work of breathing Abd: Soft, NT/ND  Ext: SCDs in place  Lab Results: CBC  Recent Labs    06/06/23 0508 06/07/23 0133  WBC 10.3 9.3  HGB 7.6* 7.7*  HCT 25.3* 26.1*  PLT 240 258   BMET Recent Labs    06/06/23 0900  NA 135  K 3.9  CL 102  CO2 26  GLUCOSE 136*  BUN 23  CREATININE 0.83  CALCIUM  8.1*   PT/INR No results for input(s): "LABPROT", "INR" in the last 72 hours. ABG No results for input(s): "PHART", "HCO3" in the last 72 hours.  Invalid input(s): "PCO2", "PO2"  Studies/Results:  Anti-infectives: Anti-infectives (From admission, onward)    Start     Dose/Rate Route Frequency Ordered Stop   05/25/23 1400  neomycin  (MYCIFRADIN ) tablet 1,000 mg  Status:  Discontinued       Placed in "And" Linked Group   1,000 mg Oral 3 times per day 05/25/23 0835 05/25/23 0840   05/25/23 1400  metroNIDAZOLE  (FLAGYL ) tablet 1,000 mg  Status:  Discontinued       Placed in "And" Linked Group   1,000 mg Oral 3 times per day 05/25/23 0835 05/25/23 0840   05/25/23 0845  cefoTEtan  (CEFOTAN ) 2 g in sodium chloride  0.9 % 100 mL IVPB        2 g 200 mL/hr over 30 Minutes Intravenous On call to O.R. 05/25/23 0835 05/26/23 0809        Assessment/Plan: Patient Active Problem List   Diagnosis Date Noted   Prediabetes 05/26/2023    Hyperglycemia 05/26/2023   S/P partial colectomy 05/25/2023   Myalgia due to statin 04/12/2023   Adenocarcinoma of cecum (HCC) 04/12/2023   Preop cardiovascular exam 04/12/2023   Hypokalemia 04/12/2023   IBS (irritable bowel syndrome) 04/12/2023   Hypercoagulable state due to paroxysmal atrial fibrillation (HCC) 12/12/2021   Uterine fibroid 12/23/2020   Abdominal bloating 04/15/2020   Coronary artery disease involving autologous vein coronary bypass graft with unstable angina pectoris (HCC) 01/08/2020   Ischemic cardiomyopathy 01/03/2020   Atrial fibrillation with rapid ventricular response (HCC) 01/02/2020   Primary hypertension 06/14/2019   Persistent atrial fibrillation (HCC) 05/14/2014   PAF (paroxysmal atrial fibrillation) (HCC) 05/04/2014   COPD (chronic obstructive pulmonary disease) (HCC) 05/04/2014   Hyperlipidemia with target low density lipoprotein (LDL) cholesterol less than 55 mg/dL    Tobacco abuse    Hypothyroid 12/03/2012   Multinodular goiter 12/03/2012   Coronary artery disease involving native coronary artery of native heart 11/14/1996    Class: Chronic   S/P CABG x 2 07/03/1996   History of ST elevation (STEMI) myocardial infarction involving left anterior descending coronary artery (HCC) 01/19/1996   POD 14 lap right colon -soft diet -ambulate 5x/day -ok to begin to transition to PO medications -home today if ok with medicine/cards -PPX: SCDs; home xarelto   Beatris Lincoln, MD [facility], [facility]

## 2023-06-09 ENCOUNTER — Telehealth: Payer: Self-pay | Admitting: *Deleted

## 2023-06-09 ENCOUNTER — Encounter: Payer: Self-pay | Admitting: Family Medicine

## 2023-06-09 NOTE — Transitions of Care (Post Inpatient/ED Visit) (Signed)
   06/09/2023  Name: Dominique Barber MRN: [id number] DOB: 20-Jun-1955  Today's TOC FU Call Status: Today's TOC FU Call Status:: Unsuccessful Call (1st Attempt) Unsuccessful Call (1st Attempt) Date: 06/09/23  Attempted to reach the patient regarding the most recent Inpatient/ED visit.   Follow Up Plan: Additional outreach attempts will be made to reach the patient to complete the Transitions of Care (Post Inpatient/ED visit) call.   Arna Better RN, BSN [place]  [facility] RN Care Manager [phone]

## 2023-06-12 ENCOUNTER — Encounter (HOSPITAL_COMMUNITY): Payer: Self-pay | Admitting: Anatomic Pathology

## 2023-06-12 ENCOUNTER — Telehealth: Payer: Self-pay | Admitting: *Deleted

## 2023-06-12 NOTE — Transitions of Care (Post Inpatient/ED Visit) (Signed)
 06/12/2023  Name: Dominique Barber MRN: [id number] DOB: Nov 18, 1955  Today's TOC FU Call Status:   Patient's Name and Date of Birth confirmed.  Transition Care Management Follow-up Telephone Call Date of Discharge: 06/08/23 Discharge Facility: [facility]) Type of Discharge: Inpatient Admission Primary Inpatient Discharge Diagnosis:: S/P partial colectomy How have you been since you were released from the hospital?: Better  Items Reviewed: Did you receive and understand the discharge instructions provided?: Yes Medications obtained,verified, and reconciled?: Yes (Medications Reviewed) Any new allergies since your discharge?: No Dietary orders reviewed?: Yes Type of Diet Ordered:: Avoid fast food, heavy meals, raw fruit/veg  64 oz of water/day Do you have support at home?: Yes People in Home [RPT]: spouse Name of Support/Comfort Primary Source: Walter/Spouse  Medications Reviewed Today: Medications Reviewed Today     Reviewed by Aura Leeds, RN (Registered Nurse) on 06/12/23 at 1138  Med List Status: <None>   Medication Order Taking? Sig Documenting Provider Last Dose Status Informant  acetaminophen  (TYLENOL ) 500 MG tablet [id number] Yes Take 500 mg by mouth every 6 (six) hours as needed for mild pain. [provider] Taking Active Multiple Informants           Med Note Guido Leeks, Lavonia Powers   Sun May 07, 2023  3:24 PM)    albuterol  (VENTOLIN  HFA) 108 [phone] Base) MCG/ACT inhaler [id number] Yes Inhale 1-2 puffs into the lungs every 6 (six) hours as needed for wheezing or shortness of breath. [place], Peter M, MD Taking Active Multiple Informants  Alpha-D-Galactosidase [facility] PO) [id number] Yes Take 2 capsules by mouth See admin instructions. Take 2 capsules after lunch and dinner [provider] Taking Active Multiple Informants  amiodarone  (PACERONE ) 200 MG tablet [id number]  Take 2 tablets (400 mg total) by mouth daily for 7 days, THEN 1 tablet (200 mg total) daily for  7 days. Start 400 mg on 05/18/23  and start the 200 mg  after surgery for one week  then stop.Arleen Lacer, MD  Expired 05/29/23 2359   Calcium  Carb-Cholecalciferol  (CALCIUM +D3 PO) [id number] Yes Take 1 tablet by mouth at bedtime. [provider] Taking Active Multiple Informants  cetirizine  (ZYRTEC ) 10 MG tablet [id number] Yes TAKE 1 TABLET DAILY Lajean Pike, MD Taking Active Multiple Informants  Evolocumab  (REPATHA  SURECLICK) 140 MG/ML SOAJ [id number] Yes Inject 140 mg into the skin every 14 (fourteen) days. Arleen Lacer, MD Taking Active Multiple Informants  levothyroxine  (SYNTHROID ) 50 MCG tablet [id number] Yes TAKE ONE AND ONE-HALF TABLETS DAILY BEFORE BREAKFAST  Patient taking differently: Take 75 mcg by mouth daily before breakfast.   Thapa, [place], MD Taking Active Multiple Informants  loperamide  (IMODIUM ) 2 MG capsule [id number] Yes Take 1 capsule (2 mg total) by mouth daily. Melvenia Stabs, MD Taking Active   methocarbamol  (ROBAXIN ) 500 MG tablet [id number] No Take 1 tablet (500 mg total) by mouth every 8 (eight) hours as needed for muscle spasms.  Patient not taking: Reported on 06/12/2023   Dalene Duck, MD Not Taking Active Multiple Informants  metoprolol  succinate (TOPROL -XL) 25 MG 24 hr tablet [id number] Yes TAKE 1 TABLET TWICE A DAY (DISCONTINUE 50 MG) Arleen Lacer, MD Taking Active Multiple Informants  nitroGLYCERIN  (NITROSTAT ) 0.4 MG SL tablet [id number] Yes Place 1 tablet (0.4 mg total) under the tongue every 5 (five) minutes x 3 doses as needed for chest pain. Arleen Lacer, MD Taking Active Multiple Informants  Peppermint Oil (IBGARD PO) [id number] No Take 1-2 tablets by mouth  See admin instructions. Take 2 after lunch and 1 tablet after supper, may take a 1 tablet dose as needed for IBS symptoms  Patient not taking: Reported on 06/12/2023   [provider] Not Taking Active Multiple Informants  polyethylene glycol powder (GLYCOLAX /MIRALAX ) 17  GM/SCOOP powder [id number] No Take 12.75 g by mouth in the morning.  Patient not taking: Reported on 06/12/2023   [provider] Not Taking Active Multiple Informants           Med Note Shann Darnel, [place] A   Wed May 10, 2023 11:16 AM) Pt does 3/4 of a capful  potassium chloride  (KLOR-CON ) 10 MEQ tablet [id number] Yes Take 10 mEq by mouth daily. [provider] Taking Active Multiple Informants  potassium chloride  SA (KLOR-CON  M) 20 MEQ tablet [id number] No Take 1 tablet (20 mEq total) by mouth daily.  Patient not taking: Reported on 05/10/2023   Arleen Lacer, MD Not Taking Active Multiple Informants  Probiotic Product (ALIGN) 4 MG CAPS [id number] Yes Take 4 mg by mouth in the morning. [provider] Taking Active Multiple Informants  Simethicone  125 MG TABS [id number] Yes Take 125-250 mg by mouth See admin instructions. Take 250 mg by mouth after lunch and 125 mg after each additional meal [provider] Taking Active Multiple Informants  XARELTO  20 MG TABS tablet [id number] Yes TAKE 1 TABLET DAILY WITH SUPPER Arleen Lacer, MD Taking Active Multiple Informants           Med Note (Kiani Wurtzel A   Mon Jun 12, 2023 11:24 AM)              Home Care and Equipment/Supplies: Were Home Health Services Ordered?: No Any new equipment or medical supplies ordered?: No  Functional Questionnaire: Do you need assistance with bathing/showering or dressing?: No Do you need assistance with meal preparation?: Yes Do you need assistance with eating?: No Do you have difficulty maintaining continence: No Do you need assistance with getting out of bed/getting out of a chair/moving?: No Do you have difficulty managing or taking your medications?: Yes  Follow up appointments reviewed: PCP Follow-up appointment confirmed?: Yes Date of PCP follow-up appointment?: 06/28/23 Follow-up Provider: Dr. Abraham Abo Specialist [facility] Follow-up appointment confirmed?: Yes Date  of Specialist follow-up appointment?: 06/19/23 Follow-Up Specialty Provider:: General Surgery Do you need transportation to your follow-up appointment?: No Do you understand care options if your condition(s) worsen?: Yes-patient verbalized understanding  SDOH Interventions Today    Flowsheet Row Most Recent Value  SDOH Interventions   Food Insecurity Interventions Intervention Not Indicated  Housing Interventions Intervention Not Indicated  Transportation Interventions Intervention Not Indicated  Utilities Interventions Intervention Not Indicated       Goals Addressed             This Visit's Progress    VBCI Transitions of Care (TOC) Care Plan       Problems:  Recent Hospitalization for treatment of Partial Colectomy Knowledge Deficit Related to post op/recovery  Goal:  Over the next 30 days, the patient will not experience hospital readmission  Interventions:  Transitions of Care: Durable Medical Equipment (DME) reviewed with patient/caregiver Doctor Visits  - discussed the importance of doctor visits Post discharge activity limitations prescribed by provider reviewed Post-op wound/incision care reviewed with patient/caregiver Reviewed Signs and symptoms of infection Medication review, advised using albuterol  inhaler prior to going for a walk  Patient Self Care Activities:  Attend all scheduled provider appointments Call provider office for new  concerns or questions  Participate in Transition of Care Program/Attend TOC scheduled calls Use albuterol  inhaler 10-15 minutes prior to going for a walk Keep a journal of bleeding episodes, notify surgeon if you experience increased bleeding, pain or fever  Plan:  Telephone follow up appointment with care management team member scheduled for:  06/21/23 at 2:30        Arna Better RN, BSN [place]  [facility] RN Care Manager [phone]

## 2023-06-12 NOTE — Telephone Encounter (Signed)
 Patient will become a virtue visit due to health reason

## 2023-06-14 ENCOUNTER — Ambulatory Visit: Payer: Medicare Other | Admitting: Endocrinology

## 2023-06-21 ENCOUNTER — Other Ambulatory Visit: Payer: Self-pay | Admitting: *Deleted

## 2023-06-21 NOTE — Transitions of Care (Post Inpatient/ED Visit) (Signed)
 Transition of Care week 2  Visit Note  06/21/2023  Name: Dominique Barber MRN: [id number]          DOB: 10-Dec-1955  Situation: Patient enrolled in [facility] 30-day program. Visit completed with Ms. Fauble by telephone.   Background:   Initial Transition Care Management Follow-up Telephone Call    Past Medical History:  Diagnosis Date   ACS (acute coronary syndrome) (HCC) 01/06/2020   With progressive angina while troponin elevation--borderline-NON-STEMI   ANTERIOR STEMI of LAD (x2). 01/19/1996   Complicated by VF arrest, and anoxic brain injury with status epilepticus; POBA of LAD --> 6 months later, after 2 vessel CABG she had postop complication with occluded LIMA/second anterior STEMI   CAD in native artery & Grafts 01/1996   a) 1/'98: Ant STEMI => LAD POBA; b) 2/'98: 95% prox LAD @ POBA site --> BMS PCI (3.0 mm x 25 mm); c) 7/98 (5 months later) => BMS ISR of LAD--> CABGx2 (LIMA-LAD, SVG-D1 -> Emergent redo w/ SVG-LAD b/c acute LIMA occlusion); d) 01/2020: ACS -> DES PCI of SVG-LAD (75%&85%), TO of SVG-D1.   COPD (chronic obstructive pulmonary disease) (HCC)    Dyspnea    GERD (gastroesophageal reflux disease)    Headache    Hiatal hernia    Hyperlipidemia    Hypertension    Hypothyroidism    On Synthroid    Iron deficiency anemia    Irritable bowel syndrome    Followed by Dr. Tami Falcon.   Ischemic cardiomyopathy 01/03/2020   ECHO (ACS): EF 45-50% (by Cath EF 40-45%) - Apical Inferior/Apical Anteroseptal & Apex akinetic, GR II DD.    PAF (paroxysmal atrial fibrillation) (HCC) 05/03/2014   New onset - originally with RVR   Pneumonia    x 2   PONV (postoperative nausea and vomiting)    very gag reflex   S/P CABG x 2; with EMERGENT Redo x 1. 07/1996   Initially LIMA-LAD, SVG-D1 --> immediate LIMA failure post CABG with anterior MI --> urgent redo SVG-LAD beyond D2.; Widely patent grafts as of 2005 with left dominant system. Graft to the diagonal branch has a very small  target vessel but the vein graft to LAD retrograde fills a large bifurcating D2 and antegrade fills a large D3.   Tobacco abuse     Assessment: Patient Reported Symptoms: Cognitive Cognitive Status: Able to follow simple commands, Alert and oriented to person, place, and time, Normal speech and language skills, Insightful and able to interpret abstract concepts      Neurological Neurological Review of Symptoms: Headaches    HEENT HEENT Symptoms Reported: No symptoms reported      Cardiovascular Cardiovascular Symptoms Reported: No symptoms reported    Respiratory Respiratory Symptoms Reported: Shortness of breath Additional Respiratory Details: SOB when walking short distances. Feels like she felt prior to needing a blood transfusion. Respiratory Self-Management Outcome: 3 (uncertain) Respiratory Comment: RNCM sent secure communication to PCP requesting labs drawn prior to virtual visit or in person visit.  Endocrine Patient reports the following symptoms related to hypoglycemia or hyperglycemia : No symptoms reported Is patient diabetic?: No    Gastrointestinal Gastrointestinal Symptoms Reported: Diarrhea Additional Gastrointestinal Details: improving, BM 2 x today. Continues to notice blood smear on tissue. Gastrointestinal Self-Management Outcome: 3 (uncertain) Gastrointestinal Comment: Patient made provider aware during post op appointment on 06/19/23    Genitourinary Genitourinary Symptoms Reported: No symptoms reported    Integumentary Integumentary Symptoms Reported: No symptoms reported    Musculoskeletal Musculoskelatal Symptoms Reviewed:  Weakness Musculoskeletal Self-Management Outcome: 3 (uncertain) Musculoskeletal Comment: Ambulating with walking stick Falls in the past year?: No    Psychosocial Psychosocial Symptoms Reported: Not assessed         There were no vitals filed for this visit.  Medications Reviewed Today     Reviewed by Aura Leeds, RN  (Registered Nurse) on 06/21/23 at 1503  Med List Status: <None>   Medication Order Taking? Sig Documenting Provider Last Dose Status Informant  acetaminophen  (TYLENOL ) 500 MG tablet [id number] Yes Take 500 mg by mouth every 6 (six) hours as needed for mild pain. [provider]  Active Multiple Informants           Med Note Guido Leeks, Lavonia Powers   Sun May 07, 2023  3:24 PM)    albuterol  (VENTOLIN  HFA) 108 [phone] Base) MCG/ACT inhaler [id number] Yes Inhale 1-2 puffs into the lungs every 6 (six) hours as needed for wheezing or shortness of breath. [place], Peter M, MD  Active Multiple Informants  Alpha-D-Galactosidase [facility] PO) [id number] Yes Take 2 capsules by mouth See admin instructions. Take 2 capsules after lunch and dinner [provider]  Active Multiple Informants  amiodarone  (PACERONE ) 200 MG tablet [id number]  Take 2 tablets (400 mg total) by mouth daily for 7 days, THEN 1 tablet (200 mg total) daily for 7 days. Start 400 mg on 05/18/23  and start the 200 mg  after surgery for one week  then stop..  Patient not taking: No sig reported   Arleen Lacer, MD  Expired 05/29/23 2359   Calcium  Carb-Cholecalciferol  (CALCIUM +D3 PO) [id number] Yes Take 1 tablet by mouth at bedtime. [provider]  Active Multiple Informants  cetirizine  (ZYRTEC ) 10 MG tablet [id number] Yes TAKE 1 TABLET DAILY Lajean Pike, MD  Active Multiple Informants  Evolocumab  (REPATHA  SURECLICK) 140 MG/ML SOAJ [id number] Yes Inject 140 mg into the skin every 14 (fourteen) days. Arleen Lacer, MD  Active Multiple Informants  levothyroxine  (SYNTHROID ) 50 MCG tablet [id number] Yes TAKE ONE AND ONE-HALF TABLETS DAILY BEFORE BREAKFAST  Patient taking differently: 75 mcg. TAKE ONE AND ONE-HALF TABLETS DAILY BEFORE BREAKFAST   Thapa, [place], MD  Active Multiple Informants  loperamide  (IMODIUM ) 2 MG capsule [id number]  Take 1 capsule (2 mg total) by mouth daily.  Patient not taking: Reported on 06/21/2023   Melvenia Stabs, MD  Active   methocarbamol  (ROBAXIN ) 500 MG tablet [id number]  Take 1 tablet (500 mg total) by mouth every 8 (eight) hours as needed for muscle spasms.  Patient not taking: Reported on 06/21/2023   Dalene Duck, MD  Active Multiple Informants  metoprolol  succinate (TOPROL -XL) 25 MG 24 hr tablet [id number] Yes TAKE 1 TABLET TWICE A DAY (DISCONTINUE 50 MG) Arleen Lacer, MD  Active Multiple Informants  nitroGLYCERIN  (NITROSTAT ) 0.4 MG SL tablet [id number] Yes Place 1 tablet (0.4 mg total) under the tongue every 5 (five) minutes x 3 doses as needed for chest pain. Arleen Lacer, MD  Active Multiple Informants  Peppermint Oil (IBGARD PO) [id number] Yes Take 1-2 tablets by mouth See admin instructions. Take 2 after lunch and 1 tablet after supper, may take a 1 tablet dose as needed for IBS symptoms [provider]  Active Multiple Informants  polyethylene glycol powder (GLYCOLAX /MIRALAX ) 17 GM/SCOOP powder [id number] Yes Take 12.75 g by mouth in the morning. [provider]  Active Multiple Informants           Med Note Shann Darnel, KYLE A  Wed May 10, 2023 11:16 AM) Pt does 3/4 of a capful  potassium chloride  (KLOR-CON ) 10 MEQ tablet [id number] Yes Take 10 mEq by mouth daily. [provider]  Active Multiple Informants  potassium chloride  SA (KLOR-CON  M) 20 MEQ tablet [id number]  Take 1 tablet (20 mEq total) by mouth daily.  Patient not taking: Reported on 06/21/2023   Arleen Lacer, MD  Active Multiple Informants  Probiotic Product (ALIGN) 4 MG CAPS [id number] Yes Take 4 mg by mouth in the morning. [provider]  Active Multiple Informants  Simethicone  125 MG TABS [id number] Yes Take 125-250 mg by mouth See admin instructions. Take 250 mg by mouth after lunch and 125 mg after each additional meal [provider]  Active Multiple Informants  XARELTO  20 MG TABS tablet [id number] Yes TAKE 1 TABLET DAILY WITH SUPPER Arleen Lacer, MD  Active Multiple  Informants           Med Note (Linzey Ramser A   Mon Jun 12, 2023 11:24 AM)              Recommendation:   Lab requests: CBC   Follow Up Plan:   Telephone follow-up in 1 week  Arna Better RN, BSN [place]  [facility] Health RN Care Manager [phone]

## 2023-06-21 NOTE — Patient Instructions (Signed)
 Visit Information  Thank you for taking time to visit with me today. Please don't hesitate to contact me if I can be of assistance to you before our next scheduled telephone appointment.  Our next appointment is by telephone on 06/29/23 at 2:30pm  Following is a copy of your care plan:   Goals Addressed             This Visit's Progress    VBCI Transitions of Care (TOC) Care Plan       Problems:  Recent Hospitalization for treatment of Partial Colectomy Knowledge Deficit Related to post op/recovery  Goal:  Over the next 30 days, the patient will not experience hospital readmission  Interventions:  Transitions of Care: Durable Medical Equipment (DME) reviewed with patient/caregiver Doctor Visits  - discussed the importance of doctor visits Contacted provider for patient needs secure communication to PCP regarding patient SOB, Hgb was 7.7 on 06/07/23, does patient need labs prior to virtual visit on 6/25 or an in person visit Post discharge activity limitations prescribed by provider reviewed Reviewed Signs and symptoms of infection Medication review, advised using albuterol  inhaler prior to going for a walk  Patient Self Care Activities:  Attend all scheduled provider appointments Call provider office for new concerns or questions  Participate in Transition of Care Program/Attend TOC scheduled calls Use albuterol  inhaler 10-15 minutes prior to going for a walk Keep a journal of bleeding episodes, notify surgeon if you experience increased bleeding, pain or fever Increase iron in your diet-fortified oatmeal is a good option   Plan:  Telephone follow up appointment with care management team member scheduled for:  06/29/23 at 2:30        Patient verbalizes understanding of instructions and care plan provided today and agrees to view in MyChart. Active MyChart status and patient understanding of how to access instructions and care plan via MyChart confirmed with patient.      Telephone follow up appointment with care management team member scheduled for:06/29/23 at 2:30pm  Please call the care guide team at [phone] if you need to cancel or reschedule your appointment.   Please call 1-800-273-TALK (toll free, 24 hour hotline) go to [facility] [address] [place] [phone]) call 911 if you are experiencing a Mental Health or Behavioral Health Crisis or need someone to talk to.  Arna Better RN, BSN [place]  [facility] RN Care Manager [phone]

## 2023-06-22 ENCOUNTER — Ambulatory Visit: Payer: Medicare Other

## 2023-06-22 ENCOUNTER — Telehealth: Payer: Self-pay | Admitting: *Deleted

## 2023-06-22 NOTE — Telephone Encounter (Signed)
:  Please advise this patient :  Reason for CRM: patient has orders from Dr Camilo Cella to get Labs done at [facility] for (CEA antigen order code [id number])before surgery and also the patient received a call from the office stating the patient needs labs with Dr Elvan Hamel, and the patients husband would like to ask if this could be an order created by Dr Elvan Hamel and combine the Labs needed for Dr Ruddy Corral office before the phone visit on 06/28/23, and also if the phone visit needs to be changed to an office visit and also The labs ordered by Dr Veria Glassing [facility] surgery of [place] so the patient can come into the [facility] square lab and make it a 1 stop    Nurse sent message to Dr Elvan Hamel yesterday

## 2023-06-26 NOTE — Telephone Encounter (Signed)
 Labs to be gotten  at upcoming appt

## 2023-06-28 ENCOUNTER — Encounter: Payer: Self-pay | Admitting: Family Medicine

## 2023-06-28 ENCOUNTER — Ambulatory Visit (INDEPENDENT_AMBULATORY_CARE_PROVIDER_SITE_OTHER): Admitting: Family Medicine

## 2023-06-28 VITALS — BP 112/78 | HR 108 | Wt 148.8 lb

## 2023-06-28 DIAGNOSIS — Z85038 Personal history of other malignant neoplasm of large intestine: Secondary | ICD-10-CM

## 2023-06-28 DIAGNOSIS — I10 Essential (primary) hypertension: Secondary | ICD-10-CM | POA: Diagnosis not present

## 2023-06-28 NOTE — Progress Notes (Unsigned)
 Established Patient Office Visit  Subjective    Patient ID: Dominique Barber, female    DOB: 1955-12-29  Age: 68 y.o. MRN: [id number]  CC:  Chief Complaint  Patient presents with   Shortness of Breath    After surgery    Medical Management of Chronic Issues    HPI Dominique Barber presents for follow up of hypertension. Patient reports that she was doing well after having a recent hemicolectomy for colon cancer with the exception of some unstable anemia.    Outpatient Encounter Medications as of 06/28/2023  Medication Sig   acetaminophen  (TYLENOL ) 500 MG tablet Take 500 mg by mouth every 6 (six) hours as needed for mild pain.   albuterol  (VENTOLIN  HFA) 108 (90 Base) MCG/ACT inhaler Inhale 1-2 puffs into the lungs every 6 (six) hours as needed for wheezing or shortness of breath.   Alpha-D-Galactosidase (BEANO PO) Take 2 capsules by mouth See admin instructions. Take 2 capsules after lunch and dinner   Calcium  Carb-Cholecalciferol  (CALCIUM +D3 PO) Take 1 tablet by mouth at bedtime.   cetirizine  (ZYRTEC ) 10 MG tablet TAKE 1 TABLET DAILY   Evolocumab  (REPATHA  SURECLICK) 140 MG/ML SOAJ Inject 140 mg into the skin every 14 (fourteen) days.   levothyroxine  (SYNTHROID ) 50 MCG tablet TAKE ONE AND ONE-HALF TABLETS DAILY BEFORE BREAKFAST (Patient taking differently: 75 mcg. TAKE ONE AND ONE-HALF TABLETS DAILY BEFORE BREAKFAST)   loperamide  (IMODIUM ) 2 MG capsule Take 1 capsule (2 mg total) by mouth daily.   methocarbamol  (ROBAXIN ) 500 MG tablet Take 1 tablet (500 mg total) by mouth every 8 (eight) hours as needed for muscle spasms.   metoprolol  succinate (TOPROL -XL) 25 MG 24 hr tablet TAKE 1 TABLET TWICE A DAY (DISCONTINUE 50 MG)   nitroGLYCERIN  (NITROSTAT ) 0.4 MG SL tablet Place 1 tablet (0.4 mg total) under the tongue every 5 (five) minutes x 3 doses as needed for chest pain.   Peppermint Oil (IBGARD PO) Take 1-2 tablets by mouth See admin instructions. Take 2 after lunch and 1 tablet after  supper, may take a 1 tablet dose as needed for IBS symptoms   polyethylene glycol powder (GLYCOLAX /MIRALAX ) 17 GM/SCOOP powder Take 12.75 g by mouth in the morning.   potassium chloride  (KLOR-CON ) 10 MEQ tablet Take 10 mEq by mouth daily.   potassium chloride  SA (KLOR-CON  M) 20 MEQ tablet Take 1 tablet (20 mEq total) by mouth daily.   Probiotic Product (ALIGN) 4 MG CAPS Take 4 mg by mouth in the morning.   Simethicone  125 MG TABS Take 125-250 mg by mouth See admin instructions. Take 250 mg by mouth after lunch and 125 mg after each additional meal   XARELTO  20 MG TABS tablet TAKE 1 TABLET DAILY WITH SUPPER   amiodarone  (PACERONE ) 200 MG tablet Take 2 tablets (400 mg total) by mouth daily for 7 days, THEN 1 tablet (200 mg total) daily for 7 days. Start 400 mg on 05/18/23  and start the 200 mg  after surgery for one week  then stop.. (Patient not taking: No sig reported)   No facility-administered encounter medications on file as of 06/28/2023.    Past Medical History:  Diagnosis Date   ACS (acute coronary syndrome) (HCC) 01/06/2020   With progressive angina while troponin elevation--borderline-NON-STEMI   ANTERIOR STEMI of LAD (x2). 01/19/1996   Complicated by VF arrest, and anoxic brain injury with status epilepticus; POBA of LAD --> 6 months later, after 2 vessel CABG she had postop complication with occluded LIMA/second  anterior STEMI   CAD in native artery & Grafts 01/1996   a) 1/'98: Ant STEMI => LAD POBA; b) 2/'98: 95% prox LAD @ POBA site --> BMS PCI (3.0 mm x 25 mm); c) 7/98 (5 months later) => BMS ISR of LAD--> CABGx2 (LIMA-LAD, SVG-D1 -> Emergent redo w/ SVG-LAD b/c acute LIMA occlusion); d) 01/2020: ACS -> DES PCI of SVG-LAD (75%&85%), TO of SVG-D1.   COPD (chronic obstructive pulmonary disease) (HCC)    Dyspnea    GERD (gastroesophageal reflux disease)    Headache    Hiatal hernia    Hyperlipidemia    Hypertension    Hypothyroidism    On Synthroid    Iron deficiency anemia     Irritable bowel syndrome    Followed by Dr. Renaye Sous.   Ischemic cardiomyopathy 01/03/2020   ECHO (ACS): EF 45-50% (by Cath EF 40-45%) - Apical Inferior/Apical Anteroseptal & Apex akinetic, GR II DD.    PAF (paroxysmal atrial fibrillation) (HCC) 05/03/2014   New onset - originally with RVR   Pneumonia    x 2   PONV (postoperative nausea and vomiting)    very gag reflex   S/P CABG x 2; with EMERGENT Redo x 1. 07/1996   Initially LIMA-LAD, SVG-D1 --> immediate LIMA failure post CABG with anterior MI --> urgent redo SVG-LAD beyond D2.; Widely patent grafts as of 2005 with left dominant system. Graft to the diagonal branch has a very small target vessel but the vein graft to LAD retrograde fills a large bifurcating D2 and antegrade fills a large D3.   Tobacco abuse     Past Surgical History:  Procedure Laterality Date   APPENDECTOMY     BIOPSY  05/08/2020   Procedure: BIOPSY;  Surgeon: Rollin Dover, MD;  Location: [facility];  Service: Endoscopy;;   BREAST EXCISIONAL BIOPSY Right    CESAREAN SECTION     CHOLECYSTECTOMY     COLONOSCOPY N/A 03/17/2023   Procedure: COLONOSCOPY;  Surgeon: Rollin Dover, MD;  Location: [facility];  Service: Gastroenterology;  Laterality: N/A;  biopsy   CORONARY ANGIOPLASTY  01/19/1996   Acute anterior STEMI- LAD POBA:  p-m LAD 70-80% narrowing & focal 95% stenosis in distal 3rd --> Treated with Emergent PTCA( POBA)  (Dr. FABIENE Pinion   CORONARY ANGIOPLASTY WITH STENT PLACEMENT  02/26/1996   3.0x42mm Multi-Link stent to prox/mid LAD (stenosis at recent PTCA site) (Dr. FABIENE Pinion)   CORONARY ARTERY BYPASS GRAFT  07/31/1996   x2; LIMA to LAD and SVG to 1st diagonal (Dr. KYM Laine)   CORONARY ARTERY BYPASS GRAFT  07/31/1996   x1; SVG to distal LAD (Dr. KYM Laine)   CORONARY STENT INTERVENTION N/A 01/08/2020   Procedure: CORONARY STENT INTERVENTION;  Surgeon: Anner Alm ORN, MD;  [facility];; , SVG-LAD severe focal disease with 75%  followed by 85% calcified stenosis--DES PCI (Resolute Onyx 3.5 mm x 38 mm - 3.6 mm)   ESOPHAGOGASTRODUODENOSCOPY N/A 03/17/2023   Procedure: EGD (ESOPHAGOGASTRODUODENOSCOPY);  Surgeon: Rollin Dover, MD;  Location: [facility];  Service: Gastroenterology;  Laterality: N/A;  biopsy   ESOPHAGOGASTRODUODENOSCOPY (EGD) WITH PROPOFOL  N/A 05/08/2020   Procedure: ESOPHAGOGASTRODUODENOSCOPY (EGD) WITH PROPOFOL ;  Surgeon: Rollin Dover, MD;  Location: [facility];  Service: Endoscopy;  Laterality: N/A;   LAPAROSCOPIC LYSIS OF ADHESIONS N/A 05/25/2023   Procedure: LYSIS, ADHESIONS, LAPAROSCOPIC;  Surgeon: Teresa Lonni HERO, MD;  Location: [facility];  Service: General;  Laterality: N/A;   LAPAROSCOPIC RIGHT COLECTOMY Right 05/25/2023   Procedure:  COLECTOMY, RIGHT, LAPAROSCOPIC;  Surgeon: Teresa Lonni HERO, MD;  Location: [facility];  Service: General;  Laterality: Right;  LAPAROSCOPIC RIGHT HEMICOLECTOMY   LEFT HEART CATH AND CORONARY ANGIOGRAPHY  07/30/1996   normal L main, LAD w/95% stenosis just before stent, diffuse 80-85% end-stent restenosis, 1st diagonal with70-75% ostial stenosis, large optional diagonal that was normal, Cfx with 2 marginals and distal PLA (all normal), RCA normal (Dr. FABIENE Pinion)   LEFT HEART CATH AND CORONARY ANGIOGRAPHY  01/19/1996   acute anterior wall MI, anoxic encephalopahty, VF; LCfx free of disease and dominant, RCA patent, L main short and patent, LAD with 70-80% narrowing and focal 95% stenosis in distal 3rd => PTCA  (Dr. FABIENE Pinion)   LEFT HEART CATH AND CORONARY ANGIOGRAPHY  01/18/2017   acute anterior wall MI, anoxic encephalopahty, VF; LCfx free of disease and dominant, RCA patent, L main short and patent, LAD with 70-80% narrowing and focal 95% stenosis in distal 3rd  (Dr. FABIENE Pinion)   LEFT HEART CATH AND CORS/GRAFTS ANGIOGRAPHY N/A 01/08/2020   Procedure: LEFT HEART CATH AND CORS/GRAFTS ANGIOGRAPHY;  Surgeon: Anner Alm ORN, MD;  [facility]; EF  40 to 45%. Mildly elevated LVEDP. Proximal LAD 100% occlusion, mid-distal LAD fills via SVG that has collaterals to D1. Normal dominant native LCx and nondominant RCA. SVG-D1 ostial 100% CTO, SVG-LAD severe focal disease with 75% followed by 85% calcified stenosis--DES PCI   LEFT HEART CATH AND CORS/GRAFTS ANGIOGRAPHY  01/31/2003   LAD totally occluded in prox 3rd, patent Cfx, SVG to mid LAd patent, SVG to small DX1 patent, LIMA to LAD was atretic and essentially occluded in junction of prox & mid 3rd, R barciocephalic and subclavian were normal (Dr. FABIENE Pinion)   NM [facility] PERF WALL MOTION  10/2010; 06/2014   a) LexiScan  Cardiolite  - mild perfusion defect r/t infarct/scar with mild periifarct ischemia in mid anterior & apical anterior region; EF 67%; abnormal, low risk study;; b) Small, moderate intensity Fixed perfusion defect - mid Anterior Wall c/w known Anterior MI. LOW RISK    OVARIAN CYST REMOVAL     TRANSTHORACIC ECHOCARDIOGRAM  02/2012; 06/2014   a) EF 50-55%, mild HK of anterospetal myocardium; mild MR;; b) EF 55-60%, mild HK of apical anterior wall.   Mild MR   TRANSTHORACIC ECHOCARDIOGRAM  01/02/2020   EF 45 to 50%. Apical inferior, apical septal and apex akinetic. GRII DD. Otherwise normal valves, normal RV. Normal atria. Normal pressures.    Family History  Problem Relation Age of Onset   Heart failure Other    Diabetes Other    Diabetes Mother    Breast cancer Mother    Thyroid  disease Paternal Aunt    Thyroid  disease Maternal Grandmother     Social History   Socioeconomic History   Marital status: Married    Spouse name: Not on file   Number of children: 3   Years of education: Not on file   Highest education level: 12th grade  Occupational History    Employer: DISABLED  Tobacco Use   Smoking status: Former    Current packs/day: 0.00    Types: Cigarettes    Quit date: 01/04/2020    Years since quitting: 3.4   Smokeless tobacco: Never  Vaping Use   Vaping status:  Never Used  Substance and Sexual Activity   Alcohol use: No    Alcohol/week: 0.0 standard drinks of alcohol   Drug use: Never   Sexual activity: Not on file  Other Topics Concern   Not on file  Social History Narrative   Married, mother of 3 children (2 living sons age 97-26 and 30-31; her daughter was the victim of a murder that took place sometime around the time of the patient's MI. She was under significant amount of stress as her daughter had gone missing. She was never found. Finally the suspect was charged and convicted in 2009. She had sessile he stop smoking until that time frame when it brought back memories and she is now back to smoking a half pack a day.She does not get routine exercise, but is active around the house and doing housecleaning chores.She does not drink alcohol.         Family history: mgm died at 10, several maternal aunts and uncles all died in their 38's as well; mother didn't have any heart issues, had dementia, DM, died in her 66's; father lung cancer - smoker died at 21; 1 brother w/o heart issues; 2 living children - one with seizure disorder, no cholesterol issues known       Diet: doesn't eat much; has problems with GI/bloating since bypass; maybe 1-1.5 meals per day; oatmeal in the monings; sandwiches/soups in small amounts in the day; IBS issues more constipation; linzess, simethicone , maalox       Exercise:  housework   Social Drivers of Corporate investment banker Strain: Low Risk  (11/24/2022)   Overall Financial Resource Strain (CARDIA)    Difficulty of Paying Living Expenses: Not very hard  Food Insecurity: No Food Insecurity (06/12/2023)   Hunger Vital Sign    Worried About Running Out of Food in the Last Year: Never true    Ran Out of Food in the Last Year: Never true  Transportation Needs: No Transportation Needs (06/12/2023)   PRAPARE - Administrator, Civil Service (Medical): No    Lack of Transportation (Non-Medical): No  Physical  Activity: Insufficiently Active (11/24/2022)   Exercise Vital Sign    Days of Exercise per Week: 3 days    Minutes of Exercise per Session: 20 min  Stress: No Stress Concern Present (11/24/2022)   [employer] of Occupational Health - Occupational Stress Questionnaire    Feeling of Stress : Not at all  Social Connections: Unknown (05/25/2023)   Social Connection and Isolation Panel    Frequency of Communication with Friends and Family: Twice a week    Frequency of Social Gatherings with Friends and Family: Patient declined    Attends Religious Services: Patient declined    Database administrator or Organizations: No    Attends Banker Meetings: Never    Marital Status: Married  Catering manager Violence: Patient Unable To Answer (06/12/2023)   Humiliation, Afraid, Rape, and Kick questionnaire    Fear of Current or Ex-Partner: Patient unable to answer    Emotionally Abused: Patient unable to answer    Physically Abused: Patient unable to answer    Sexually Abused: Patient unable to answer    Review of Systems  All other systems reviewed and are negative.       Objective    BP 112/78   Pulse (!) 108   Wt 148 lb 12.8 oz (67.5 kg)   SpO2 97%   BMI 27.22 kg/m   Physical Exam Vitals and nursing note reviewed.  Constitutional:      General: She is not in acute distress.  Cardiovascular:     Rate and Rhythm: Normal rate  and regular rhythm.  Pulmonary:     Effort: Pulmonary effort is normal.     Breath sounds: Normal breath sounds.  Abdominal:     Palpations: Abdomen is soft.     Tenderness: There is no abdominal tenderness.   Neurological:     General: No focal deficit present.     Mental Status: She is alert and oriented to person, place, and time.         Assessment & Plan:   1. Essential hypertension (Primary) Appears stable. Continue  - Lipid Panel - CMP14+EGFR  2. History of colon cancer Management as per consultant - CBC with  Differential     No follow-ups on file.   Tanda Raguel SQUIBB, MD

## 2023-06-29 ENCOUNTER — Encounter: Payer: Self-pay | Admitting: Family Medicine

## 2023-06-29 ENCOUNTER — Ambulatory Visit: Payer: Self-pay | Admitting: Family Medicine

## 2023-06-29 ENCOUNTER — Other Ambulatory Visit: Payer: Self-pay | Admitting: *Deleted

## 2023-06-29 LAB — LIPID PANEL
Chol/HDL Ratio: 2.5 ratio (ref 0.0–4.4)
Cholesterol, Total: 129 mg/dL (ref 100–199)
HDL: 51 mg/dL (ref >39)
LDL Chol Calc (NIH): 52 mg/dL (ref 0–99)
Triglycerides: 153 mg/dL — ABNORMAL HIGH (ref 0–149)
VLDL Cholesterol Cal: 26 mg/dL (ref 5–40)

## 2023-06-29 LAB — CBC WITH DIFFERENTIAL/PLATELET
Basophils Absolute: 0.1 10*3/uL (ref 0.0–0.2)
Basos: 1 %
EOS (ABSOLUTE): 0.2 10*3/uL (ref 0.0–0.4)
Eos: 3 %
Hematocrit: 32.7 % — ABNORMAL LOW (ref 34.0–46.6)
Hemoglobin: 10.3 g/dL — ABNORMAL LOW (ref 11.1–15.9)
Immature Grans (Abs): 0 10*3/uL (ref 0.0–0.1)
Immature Granulocytes: 0 %
Lymphocytes Absolute: 1.3 10*3/uL (ref 0.7–3.1)
Lymphs: 21 %
MCH: 28.1 pg (ref 26.6–33.0)
MCHC: 31.5 g/dL (ref 31.5–35.7)
MCV: 89 fL (ref 79–97)
Monocytes Absolute: 0.4 10*3/uL (ref 0.1–0.9)
Monocytes: 7 %
Neutrophils Absolute: 4.5 10*3/uL (ref 1.4–7.0)
Neutrophils: 68 %
Platelets: 397 10*3/uL (ref 150–450)
RBC: 3.67 x10E6/uL — ABNORMAL LOW (ref 3.77–5.28)
RDW: 16.3 % — ABNORMAL HIGH (ref 11.7–15.4)
WBC: 6.5 10*3/uL (ref 3.4–10.8)

## 2023-06-29 NOTE — Transitions of Care (Post Inpatient/ED Visit) (Signed)
 Transition of Care week 3  Visit Note  06/29/2023  Name: Dominique Barber MRN: [id number]          DOB: 1955-09-09  Situation: Patient enrolled in [facility] 30-day program. Visit completed with Ms. Halbleib by telephone.   Background:   Initial Transition Care Management Follow-up Telephone Call    Past Medical History:  Diagnosis Date   ACS (acute coronary syndrome) (HCC) 01/06/2020   With progressive angina while troponin elevation--borderline-NON-STEMI   ANTERIOR STEMI of LAD (x2). 01/19/1996   Complicated by VF arrest, and anoxic brain injury with status epilepticus; POBA of LAD --> 6 months later, after 2 vessel CABG she had postop complication with occluded LIMA/second anterior STEMI   CAD in native artery & Grafts 01/1996   a) 1/'98: Ant STEMI => LAD POBA; b) 2/'98: 95% prox LAD @ POBA site --> BMS PCI (3.0 mm x 25 mm); c) 7/98 (5 months later) => BMS ISR of LAD--> CABGx2 (LIMA-LAD, SVG-D1 -> Emergent redo w/ SVG-LAD b/c acute LIMA occlusion); d) 01/2020: ACS -> DES PCI of SVG-LAD (75%&85%), TO of SVG-D1.   COPD (chronic obstructive pulmonary disease) (HCC)    Dyspnea    GERD (gastroesophageal reflux disease)    Headache    Hiatal hernia    Hyperlipidemia    Hypertension    Hypothyroidism    On Synthroid    Iron deficiency anemia    Irritable bowel syndrome    Followed by Dr. Renaye Sous.   Ischemic cardiomyopathy 01/03/2020   ECHO (ACS): EF 45-50% (by Cath EF 40-45%) - Apical Inferior/Apical Anteroseptal & Apex akinetic, GR II DD.    PAF (paroxysmal atrial fibrillation) (HCC) 05/03/2014   New onset - originally with RVR   Pneumonia    x 2   PONV (postoperative nausea and vomiting)    very gag reflex   S/P CABG x 2; with EMERGENT Redo x 1. 07/1996   Initially LIMA-LAD, SVG-D1 --> immediate LIMA failure post CABG with anterior MI --> urgent redo SVG-LAD beyond D2.; Widely patent grafts as of 2005 with left dominant system. Graft to the diagonal branch has a very small  target vessel but the vein graft to LAD retrograde fills a large bifurcating D2 and antegrade fills a large D3.   Tobacco abuse     Assessment: Patient Reported Symptoms: Cognitive Cognitive Status: Able to follow simple commands, Alert and oriented to person, place, and time, Normal speech and language skills, Insightful and able to interpret abstract concepts      Neurological Neurological Review of Symptoms: No symptoms reported    HEENT HEENT Symptoms Reported: No symptoms reported      Cardiovascular Cardiovascular Symptoms Reported: No symptoms reported    Respiratory Respiratory Symptoms Reported: Shortness of breath Additional Respiratory Details: SOB with activity, improving slightly Respiratory Comment: Follow up with PCP on 06/28/23, anemia improving. Planning to recheck CBC in 6-8 wks  Endocrine Patient reports the following symptoms related to hypoglycemia or hyperglycemia : No symptoms reported    Gastrointestinal Gastrointestinal Symptoms Reported: Abdominal pain or discomfort Additional Gastrointestinal Details: sharp pain left side that lasted a second over the last two days. Gastrointestinal Self-Management Outcome: 3 (uncertain)    Genitourinary Genitourinary Symptoms Reported: No symptoms reported    Integumentary Integumentary Symptoms Reported: No symptoms reported    Musculoskeletal Musculoskelatal Symptoms Reviewed: Weakness Musculoskeletal Self-Management Outcome: 3 (uncertain) Musculoskeletal Comment: Walking with husband several times daily      Psychosocial Psychosocial Symptoms Reported: Not assessed  There were no vitals filed for this visit.  Medications Reviewed Today     Reviewed by Lucky Andrea LABOR, RN (Registered Nurse) on 06/29/23 at 1438  Med List Status: <None>   Medication Order Taking? Sig Documenting Provider Last Dose Status Informant  acetaminophen  (TYLENOL ) 500 MG tablet [id number] Yes Take 500 mg by mouth every 6 (six)  hours as needed for mild pain. [provider]  Active Multiple Informants           Med Note MARISA, NATHANEL SAILOR   Sun May 07, 2023  3:24 PM)    albuterol  (VENTOLIN  HFA) 108 [phone] Base) MCG/ACT inhaler [id number] Yes Inhale 1-2 puffs into the lungs every 6 (six) hours as needed for wheezing or shortness of breath. [place], Peter M, MD  Active Multiple Informants  Alpha-D-Galactosidase [facility] PO) [id number] Yes Take 2 capsules by mouth See admin instructions. Take 2 capsules after lunch and dinner [provider]  Active Multiple Informants  amiodarone  (PACERONE ) 200 MG tablet [id number]  Take 2 tablets (400 mg total) by mouth daily for 7 days, THEN 1 tablet (200 mg total) daily for 7 days. Start 400 mg on 05/18/23  and start the 200 mg  after surgery for one week  then stop..  Patient not taking: No sig reported   Anner Alm ORN, MD  Expired 05/29/23 2359   Calcium  Carb-Cholecalciferol  (CALCIUM +D3 PO) [id number] Yes Take 1 tablet by mouth at bedtime. [provider]  Active Multiple Informants  cetirizine  (ZYRTEC ) 10 MG tablet [id number] Yes TAKE 1 TABLET DAILY Von [place], MD  Active Multiple Informants  Evolocumab  (REPATHA  SURECLICK) 140 MG/ML SOAJ [id number] Yes Inject 140 mg into the skin every 14 (fourteen) days. Anner Alm ORN, MD  Active Multiple Informants  levothyroxine  (SYNTHROID ) 50 MCG tablet [id number] Yes TAKE ONE AND ONE-HALF TABLETS DAILY BEFORE BREAKFAST [place], [place], MD  Active Multiple Informants  loperamide  (IMODIUM ) 2 MG capsule [id number] Yes Take 1 capsule (2 mg total) by mouth daily.  Patient taking differently: Take 2 mg by mouth as needed.   Teresa Lonni HERO, MD  Active   methocarbamol  (ROBAXIN ) 500 MG tablet [id number] Yes Take 1 tablet (500 mg total) by mouth every 8 (eight) hours as needed for muscle spasms. Patt Alm Macho, MD  Active Multiple Informants  metoprolol  succinate (TOPROL -XL) 25 MG 24 hr tablet [id number] Yes TAKE 1 TABLET TWICE A DAY  (DISCONTINUE 50 MG) Anner Alm ORN, MD  Active Multiple Informants  nitroGLYCERIN  (NITROSTAT ) 0.4 MG SL tablet [id number] Yes Place 1 tablet (0.4 mg total) under the tongue every 5 (five) minutes x 3 doses as needed for chest pain. Anner Alm ORN, MD  Active Multiple Informants  Peppermint Oil (IBGARD PO) [id number] Yes Take 1-2 tablets by mouth See admin instructions. Take 2 after lunch and 1 tablet after supper, may take a 1 tablet dose as needed for IBS symptoms [provider]  Active Multiple Informants  polyethylene glycol powder (GLYCOLAX /MIRALAX ) 17 GM/SCOOP powder [id number] Yes Take 12.75 g by mouth in the morning. [provider]  Active Multiple Informants           Med Note JERALYN, [place] A   Wed May 10, 2023 11:16 AM) Pt does 3/4 of a capful  potassium chloride  (KLOR-CON ) 10 MEQ tablet [id number] Yes Take 10 mEq by mouth daily. [provider]  Active Multiple Informants  potassium chloride  SA (KLOR-CON  M) 20 MEQ tablet [id number]  Take 1 tablet (20 mEq total) by mouth daily.  Patient not taking: Reported on 06/29/2023   Anner Alm ORN, MD  Active Multiple Informants  Probiotic Product (ALIGN) 4 MG CAPS [id number] Yes Take 4 mg by mouth in the morning. [provider]  Active Multiple Informants  Simethicone  125 MG TABS [id number] Yes Take 125-250 mg by mouth See admin instructions. Take 250 mg by mouth after lunch and 125 mg after each additional meal [provider]  Active Multiple Informants  XARELTO  20 MG TABS tablet [id number] Yes TAKE 1 TABLET DAILY WITH SUPPER Anner Alm ORN, MD  Active Multiple Informants           Med Note (Benjimen Kelley A   Mon Jun 12, 2023 11:24 AM)              Recommendation:   Continue Current Plan of Care  Follow Up Plan:   Telephone follow-up in 1 week  Andrea Dimes RN, BSN [place]  [facility] RN Care Manager [phone]

## 2023-06-29 NOTE — Patient Instructions (Signed)
 Visit Information  Thank you for taking time to visit with me today. Please don't hesitate to contact me if I can be of assistance to you before our next scheduled telephone appointment.   Following is a copy of your care plan:   Goals Addressed             This Visit's Progress    [facility] Transitions of Care (TOC) Care Plan       Problems:  Recent Hospitalization for treatment of Partial Colectomy Knowledge Deficit Related to post op/recovery  Goal:  Over the next 30 days, the patient will not experience hospital readmission  Interventions:  Transitions of Care: Doctor Visits  - discussed the importance of doctor visits Post discharge activity limitations prescribed by provider reviewed Reviewed Signs and symptoms of infection Medication review Reviewed provider notes and labs, discussed with patient  Patient Self Care Activities:  Attend all scheduled provider appointments Call provider office for new concerns or questions  Participate in Transition of Care Program/Attend TOC scheduled calls Use albuterol  inhaler 10-15 minutes prior to going for a walk Keep a journal of bleeding episodes, notify surgeon if you experience increased bleeding, pain or fever Increase iron in your diet-fortified oatmeal is a good option   Plan:  Telephone follow up appointment with care management team member scheduled for:  07/06/23 at 2:30        Patient verbalizes understanding of instructions and care plan provided today and agrees to view in MyChart. Active MyChart status and patient understanding of how to access instructions and care plan via MyChart confirmed with patient.     Telephone follow up appointment with care management team member scheduled for:07/06/23 at 2:30pm  Please call the care guide team at [phone] if you need to cancel or reschedule your appointment.   Please call [phone]-TALK (toll free, 24 hour hotline) go to [facility]  [address], [place] [phone]) call 911 if you are experiencing a Mental Health or Behavioral Health Crisis or need someone to talk to.  Andrea Dimes RN, BSN [place]  [facility] RN Care Manager [phone]

## 2023-07-03 LAB — CMP14+EGFR

## 2023-07-04 LAB — CMP14+EGFR
ALT: 20 IU/L (ref 0–32)
AST: 14 IU/L (ref 0–40)
Albumin: 4.4 g/dL (ref 3.9–4.9)
Alkaline Phosphatase: 96 IU/L (ref 44–121)
BUN/Creatinine Ratio: 24 (ref 12–28)
BUN: 26 mg/dL (ref 8–27)
Bilirubin Total: <0.2 mg/dL (ref 0.0–1.2)
Calcium: 9.9 mg/dL (ref 8.7–10.3)
Chloride: 108 mmol/L — ABNORMAL HIGH (ref 96–106)
Creatinine, Ser: 1.1 mg/dL — ABNORMAL HIGH (ref 0.57–1.00)
Globulin, Total: 2.8 g/dL (ref 1.5–4.5)
Glucose: 172 mg/dL — ABNORMAL HIGH (ref 70–99)
Potassium: 4.1 mmol/L (ref 3.5–5.2)
Sodium: 139 mmol/L (ref 134–144)
Total Protein: 7.2 g/dL (ref 6.0–8.5)
eGFR: 55 mL/min/{1.73_m2} — ABNORMAL LOW (ref >59)

## 2023-07-04 LAB — SPECIMEN STATUS REPORT

## 2023-07-06 ENCOUNTER — Other Ambulatory Visit: Payer: Self-pay | Admitting: *Deleted

## 2023-07-06 NOTE — Patient Instructions (Signed)
 Visit Information  Thank you for taking time to visit with me today. Please don't hesitate to contact me if I can be of assistance to you before our next scheduled telephone appointment.   Following is a copy of your care plan:   Goals Addressed             This Visit's Progress    VBCI Transitions of Care (TOC) Care Plan       Problems:  Recent Hospitalization for treatment of Partial Colectomy Knowledge Deficit Related to post op/recovery  Goal:  Over the next 30 days, the patient will not experience hospital readmission  Interventions:  Transitions of Care: Doctor Visits  - discussed the importance of doctor visits Post discharge activity limitations prescribed by provider reviewed Reviewed Signs and symptoms of infection Medication review, discussed can use Robaxin  for muscle spasms Advised patient to follow up with provider with questions or concerns  Patient Self Care Activities:  Attend all scheduled provider appointments Call provider office for new concerns or questions  Participate in Transition of Care Program/Attend TOC scheduled calls Use albuterol  inhaler 10-15 minutes prior to going for a walk Keep a journal of bleeding episodes, notify surgeon if you experience increased bleeding, pain or fever Increase iron in your diet-fortified oatmeal is a good option   Plan:  Telephone follow up appointment with care management team member scheduled for:  07/13/23 at 2:30        Patient verbalizes understanding of instructions and care plan provided today and agrees to view in MyChart. Active MyChart status and patient understanding of how to access instructions and care plan via MyChart confirmed with patient.     Telephone follow up appointment with care management team member scheduled for:  Please call the care guide team at [phone] if you need to cancel or reschedule your appointment.   Please call [phone]-TALK (toll free, 24 hour hotline) go to  [facility] [address], [place] [phone]) call 911 if you are experiencing a Mental Health or Behavioral Health Crisis or need someone to talk to.  Andrea Dimes RN, BSN [place]  [facility] RN Care Manager [phone]

## 2023-07-06 NOTE — Transitions of Care (Post Inpatient/ED Visit) (Signed)
 Transition of Care week 4  Visit Note  07/06/2023  Name: Dominique Barber MRN: [id number]          DOB: 04-04-1955  Situation: Patient enrolled in [facility] 30-day program. Visit completed with Ms. Casella by telephone.   Background:   Initial Transition Care Management Follow-up Telephone Call    Past Medical History:  Diagnosis Date   ACS (acute coronary syndrome) (HCC) 01/06/2020   With progressive angina while troponin elevation--borderline-NON-STEMI   ANTERIOR STEMI of LAD (x2). 01/19/1996   Complicated by VF arrest, and anoxic brain injury with status epilepticus; POBA of LAD --> 6 months later, after 2 vessel CABG she had postop complication with occluded LIMA/second anterior STEMI   CAD in native artery & Grafts 01/1996   a) 1/'98: Ant STEMI => LAD POBA; b) 2/'98: 95% prox LAD @ POBA site --> BMS PCI (3.0 mm x 25 mm); c) 7/98 (5 months later) => BMS ISR of LAD--> CABGx2 (LIMA-LAD, SVG-D1 -> Emergent redo w/ SVG-LAD b/c acute LIMA occlusion); d) 01/2020: ACS -> DES PCI of SVG-LAD (75%&85%), TO of SVG-D1.   COPD (chronic obstructive pulmonary disease) (HCC)    Dyspnea    GERD (gastroesophageal reflux disease)    Headache    Hiatal hernia    Hyperlipidemia    Hypertension    Hypothyroidism    On Synthroid    Iron deficiency anemia    Irritable bowel syndrome    Followed by Dr. Renaye Sous.   Ischemic cardiomyopathy 01/03/2020   ECHO (ACS): EF 45-50% (by Cath EF 40-45%) - Apical Inferior/Apical Anteroseptal & Apex akinetic, GR II DD.    PAF (paroxysmal atrial fibrillation) (HCC) 05/03/2014   New onset - originally with RVR   Pneumonia    x 2   PONV (postoperative nausea and vomiting)    very gag reflex   S/P CABG x 2; with EMERGENT Redo x 1. 07/1996   Initially LIMA-LAD, SVG-D1 --> immediate LIMA failure post CABG with anterior MI --> urgent redo SVG-LAD beyond D2.; Widely patent grafts as of 2005 with left dominant system. Graft to the diagonal branch has a very small  target vessel but the vein graft to LAD retrograde fills a large bifurcating D2 and antegrade fills a large D3.   Tobacco abuse     Assessment: Patient Reported Symptoms: Cognitive Cognitive Status: No symptoms reported      Neurological Neurological Review of Symptoms: No symptoms reported    HEENT HEENT Symptoms Reported: No symptoms reported      Cardiovascular Cardiovascular Symptoms Reported: No symptoms reported    Respiratory Respiratory Symptoms Reported: No symptoms reported Respiratory Self-Management Outcome: 4 (good)  Endocrine Endocrine Symptoms Reported: No symptoms reported    Gastrointestinal Gastrointestinal Symptoms Reported: Abdominal pain or discomfort Additional Gastrointestinal Details: left lower quadrant discomfort that increases with passing gas or BM. Denies bleeding or fever. Gastrointestinal Management Strategies: Medication therapy, Adequate rest Gastrointestinal Self-Management Outcome: 3 (uncertain) Gastrointestinal Comment: RNCM advised patient to use heat and rest to help with discomfort and follow up with provider.    Genitourinary Genitourinary Symptoms Reported: No symptoms reported    Integumentary Integumentary Symptoms Reported: No symptoms reported    Musculoskeletal Musculoskelatal Symptoms Reviewed: Not assessed        Psychosocial Psychosocial Symptoms Reported: Not assessed         There were no vitals filed for this visit.  Medications Reviewed Today     Reviewed by Lucky Andrea LABOR, RN (Registered Nurse) on 07/06/23  at 1444  Med List Status: <None>   Medication Order Taking? Sig Documenting Provider Last Dose Status Informant  acetaminophen  (TYLENOL ) 500 MG tablet [id number] Yes Take 500 mg by mouth every 6 (six) hours as needed for mild pain. [provider]  Active Multiple Informants           Med Note MARISA, NATHANEL SAILOR   Sun May 07, 2023  3:24 PM)    albuterol  (VENTOLIN  HFA) 108 [phone] Base) MCG/ACT inhaler [id number]  Yes Inhale 1-2 puffs into the lungs every 6 (six) hours as needed for wheezing or shortness of breath. [place], Peter M, MD  Active Multiple Informants  Alpha-D-Galactosidase [facility] PO) [id number] Yes Take 2 capsules by mouth See admin instructions. Take 2 capsules after lunch and dinner [provider]  Active Multiple Informants  amiodarone  (PACERONE ) 200 MG tablet [id number]  Take 2 tablets (400 mg total) by mouth daily for 7 days, THEN 1 tablet (200 mg total) daily for 7 days. Start 400 mg on 05/18/23  and start the 200 mg  after surgery for one week  then stop..  Patient not taking: No sig reported   Anner Alm ORN, MD  Expired 05/29/23 2359   Calcium  Carb-Cholecalciferol  (CALCIUM +D3 PO) [id number] Yes Take 1 tablet by mouth at bedtime. [provider]  Active Multiple Informants  cetirizine  (ZYRTEC ) 10 MG tablet [id number] Yes TAKE 1 TABLET DAILY Von Pacific, MD  Active Multiple Informants  Evolocumab  (REPATHA  SURECLICK) 140 MG/ML SOAJ [id number] Yes Inject 140 mg into the skin every 14 (fourteen) days. Anner Alm ORN, MD  Active Multiple Informants  levothyroxine  (SYNTHROID ) 50 MCG tablet [id number] Yes TAKE ONE AND ONE-HALF TABLETS DAILY BEFORE BREAKFAST Thapa, [place], MD  Active Multiple Informants  loperamide  (IMODIUM ) 2 MG capsule [id number] Yes Take 1 capsule (2 mg total) by mouth daily.  Patient taking differently: Take 2 mg by mouth as needed.   Teresa Lonni HERO, MD  Active   methocarbamol  (ROBAXIN ) 500 MG tablet [id number]  Take 1 tablet (500 mg total) by mouth every 8 (eight) hours as needed for muscle spasms.  Patient not taking: Reported on 07/06/2023   Patt Alm Macho, MD  Active Multiple Informants  metoprolol  succinate (TOPROL -XL) 25 MG 24 hr tablet [id number] Yes TAKE 1 TABLET TWICE A DAY (DISCONTINUE 50 MG) Anner Alm ORN, MD  Active Multiple Informants  nitroGLYCERIN  (NITROSTAT ) 0.4 MG SL tablet [id number] Yes Place 1 tablet (0.4 mg total) under the tongue  every 5 (five) minutes x 3 doses as needed for chest pain. Anner Alm ORN, MD  Active Multiple Informants  Peppermint Oil (IBGARD PO) [id number] Yes Take 1-2 tablets by mouth See admin instructions. Take 2 after lunch and 1 tablet after supper, may take a 1 tablet dose as needed for IBS symptoms [provider]  Active Multiple Informants  polyethylene glycol powder (GLYCOLAX /MIRALAX ) 17 GM/SCOOP powder [id number] Yes Take 12.75 g by mouth in the morning. [provider]  Active Multiple Informants           Med Note JERALYN, [place] A   Wed May 10, 2023 11:16 AM) Pt does 3/4 of a capful  potassium chloride  (KLOR-CON ) 10 MEQ tablet [id number] Yes Take 10 mEq by mouth daily. [provider]  Active Multiple Informants  potassium chloride  SA (KLOR-CON  M) 20 MEQ tablet [id number]  Take 1 tablet (20 mEq total) by mouth daily.  Patient not taking: Reported on 07/06/2023   Anner Alm ORN, MD  Active Multiple Informants  Probiotic Product (ALIGN) 4 MG CAPS [id number] Yes Take 4 mg by mouth in the morning. [provider]  Active Multiple Informants  Simethicone  125 MG TABS [id number] Yes Take 125-250 mg by mouth See admin instructions. Take 250 mg by mouth after lunch and 125 mg after each additional meal [provider]  Active Multiple Informants  XARELTO  20 MG TABS tablet [id number] Yes TAKE 1 TABLET DAILY WITH SUPPER Anner Alm ORN, MD  Active Multiple Informants           Med Note (Darnesha Diloreto A   Mon Jun 12, 2023 11:24 AM)              Recommendation:   Continue Current Plan of Care  Follow Up Plan:   Telephone follow-up in 1 week  Andrea Dimes RN, BSN [place]  Value[facility] Institute [facility] RN Care Manager [phone]

## 2023-07-10 ENCOUNTER — Other Ambulatory Visit: Payer: Self-pay | Admitting: Endocrinology

## 2023-07-10 DIAGNOSIS — E890 Postprocedural hypothyroidism: Secondary | ICD-10-CM

## 2023-07-11 ENCOUNTER — Other Ambulatory Visit: Payer: Self-pay

## 2023-07-11 NOTE — Telephone Encounter (Signed)
 Refill request complete

## 2023-07-13 ENCOUNTER — Other Ambulatory Visit: Payer: Self-pay | Admitting: *Deleted

## 2023-07-13 NOTE — Transitions of Care (Post Inpatient/ED Visit) (Signed)
 Transition of Care week 5  Visit Note  07/13/2023  Name: Dominique Barber MRN: [id number]          DOB: 04/19/1955  Situation: Patient enrolled in [facility] 30-day program. Visit completed with Ms. Torre by telephone.   Background:   Initial Transition Care Management Follow-up Telephone Call    Past Medical History:  Diagnosis Date   ACS (acute coronary syndrome) (HCC) 01/06/2020   With progressive angina while troponin elevation--borderline-NON-STEMI   ANTERIOR STEMI of LAD (x2). 01/19/1996   Complicated by VF arrest, and anoxic brain injury with status epilepticus; POBA of LAD --> 6 months later, after 2 vessel CABG she had postop complication with occluded LIMA/second anterior STEMI   CAD in native artery & Grafts 01/1996   a) 1/'98: Ant STEMI => LAD POBA; b) 2/'98: 95% prox LAD @ POBA site --> BMS PCI (3.0 mm x 25 mm); c) 7/98 (5 months later) => BMS ISR of LAD--> CABGx2 (LIMA-LAD, SVG-D1 -> Emergent redo w/ SVG-LAD b/c acute LIMA occlusion); d) 01/2020: ACS -> DES PCI of SVG-LAD (75%&85%), TO of SVG-D1.   COPD (chronic obstructive pulmonary disease) (HCC)    Dyspnea    GERD (gastroesophageal reflux disease)    Headache    Hiatal hernia    Hyperlipidemia    Hypertension    Hypothyroidism    On Synthroid    Iron deficiency anemia    Irritable bowel syndrome    Followed by Dr. Renaye Sous.   Ischemic cardiomyopathy 01/03/2020   ECHO (ACS): EF 45-50% (by Cath EF 40-45%) - Apical Inferior/Apical Anteroseptal & Apex akinetic, GR II DD.    PAF (paroxysmal atrial fibrillation) (HCC) 05/03/2014   New onset - originally with RVR   Pneumonia    x 2   PONV (postoperative nausea and vomiting)    very gag reflex   S/P CABG x 2; with EMERGENT Redo x 1. 07/1996   Initially LIMA-LAD, SVG-D1 --> immediate LIMA failure post CABG with anterior MI --> urgent redo SVG-LAD beyond D2.; Widely patent grafts as of 2005 with left dominant system. Graft to the diagonal branch has a very small  target vessel but the vein graft to LAD retrograde fills a large bifurcating D2 and antegrade fills a large D3.   Tobacco abuse     Assessment: Patient Reported Symptoms: Cognitive Cognitive Status: No symptoms reported      Neurological Neurological Review of Symptoms: No symptoms reported    HEENT HEENT Symptoms Reported: No symptoms reported      Cardiovascular Cardiovascular Symptoms Reported: No symptoms reported    Respiratory Respiratory Symptoms Reported: Shortness of breath Additional Respiratory Details: continues to have SOB with activity at times. Patient reports feeling better and being more active Respiratory Self-Management Outcome: 4 (good)  Endocrine Endocrine Symptoms Reported: No symptoms reported    Gastrointestinal Gastrointestinal Symptoms Reported: Diarrhea Additional Gastrointestinal Details: Occasional diarrhea Gastrointestinal Self-Management Outcome: 4 (good) Gastrointestinal Comment: lower abdominal pain has improved    Genitourinary Genitourinary Symptoms Reported: No symptoms reported    Integumentary Integumentary Symptoms Reported: No symptoms reported    Musculoskeletal Musculoskelatal Symptoms Reviewed: No symptoms reported Musculoskeletal Self-Management Outcome: 4 (good) Musculoskeletal Comment: Patient more active, doing some housework and walking outside in her back yard.      Psychosocial Psychosocial Symptoms Reported: No symptoms reported         There were no vitals filed for this visit.  Medications Reviewed Today     Reviewed by Lucky Andrea LABOR, RN (  Registered Nurse) on 07/13/23 at 1500  Med List Status: <None>   Medication Order Taking? Sig Documenting Provider Last Dose Status Informant  acetaminophen  (TYLENOL ) 500 MG tablet [id number] Yes Take 500 mg by mouth every 6 (six) hours as needed for mild pain. [provider]  Active Multiple Informants           Med Note MARISA, NATHANEL SAILOR   Sun May 07, 2023  3:24 PM)     albuterol  (VENTOLIN  HFA) 108 [phone] Base) MCG/ACT inhaler [id number] Yes Inhale 1-2 puffs into the lungs every 6 (six) hours as needed for wheezing or shortness of breath. [place], Peter M, MD  Active Multiple Informants  Alpha-D-Galactosidase [facility] PO) [id number] Yes Take 2 capsules by mouth See admin instructions. Take 2 capsules after lunch and dinner [provider]  Active Multiple Informants  amiodarone  (PACERONE ) 200 MG tablet [id number]  Take 2 tablets (400 mg total) by mouth daily for 7 days, THEN 1 tablet (200 mg total) daily for 7 days. Start 400 mg on 05/18/23  and start the 200 mg  after surgery for one week  then stop..  Patient not taking: No sig reported   Anner Alm ORN, MD  Expired 05/29/23 2359   Calcium  Carb-Cholecalciferol  (CALCIUM +D3 PO) [id number] Yes Take 1 tablet by mouth at bedtime. [provider]  Active Multiple Informants  cetirizine  (ZYRTEC ) 10 MG tablet [id number] Yes TAKE 1 TABLET DAILY Von [place], MD  Active Multiple Informants  Evolocumab  (REPATHA  SURECLICK) 140 MG/ML SOAJ [id number] Yes Inject 140 mg into the skin every 14 (fourteen) days. Anner Alm ORN, MD  Active Multiple Informants  levothyroxine  (SYNTHROID ) 50 MCG tablet [id number] Yes TAKE ONE AND ONE-HALF TABLETS DAILY BEFORE BREAKFAST Thapa, [place], MD  Active   loperamide  (IMODIUM ) 2 MG capsule [id number] Yes Take 1 capsule (2 mg total) by mouth daily.  Patient taking differently: Take 2 mg by mouth as needed.   Teresa Lonni HERO, MD  Active   methocarbamol  (ROBAXIN ) 500 MG tablet [id number]  Take 1 tablet (500 mg total) by mouth every 8 (eight) hours as needed for muscle spasms.  Patient not taking: Reported on 07/13/2023   Patt Alm Macho, MD  Active Multiple Informants  metoprolol  succinate (TOPROL -XL) 25 MG 24 hr tablet [id number] Yes TAKE 1 TABLET TWICE A DAY (DISCONTINUE 50 MG) Anner Alm ORN, MD  Active Multiple Informants  nitroGLYCERIN  (NITROSTAT ) 0.4 MG SL tablet [id number] Yes  Place 1 tablet (0.4 mg total) under the tongue every 5 (five) minutes x 3 doses as needed for chest pain. Anner Alm ORN, MD  Active Multiple Informants  Peppermint Oil (IBGARD PO) [id number] Yes Take 1-2 tablets by mouth See admin instructions. Take 2 after lunch and 1 tablet after supper, may take a 1 tablet dose as needed for IBS symptoms [provider]  Active Multiple Informants  polyethylene glycol powder (GLYCOLAX /MIRALAX ) 17 GM/SCOOP powder [id number] Yes Take 12.75 g by mouth in the morning. [provider]  Active Multiple Informants           Med Note JERALYN, [place] A   Wed May 10, 2023 11:16 AM) Pt does 3/4 of a capful  potassium chloride  (KLOR-CON ) 10 MEQ tablet [id number] Yes Take 10 mEq by mouth daily. [provider]  Active Multiple Informants  potassium chloride  SA (KLOR-CON  M) 20 MEQ tablet [id number]  Take 1 tablet (20 mEq total) by mouth daily.  Patient not taking: Reported on 07/13/2023   Anner Alm ORN, MD  Active  Multiple Informants  Probiotic Product (ALIGN) 4 MG CAPS [id number] Yes Take 4 mg by mouth in the morning. [provider]  Active Multiple Informants  Simethicone  125 MG TABS [id number] Yes Take 125-250 mg by mouth See admin instructions. Take 250 mg by mouth after lunch and 125 mg after each additional meal [provider]  Active Multiple Informants  XARELTO  20 MG TABS tablet [id number] Yes TAKE 1 TABLET DAILY WITH SUPPER Anner Alm ORN, MD  Active Multiple Informants           Med Note (Milferd Ansell A   Mon Jun 12, 2023 11:24 AM)              Recommendation:   Continue Current Plan of Care  Follow Up Plan:   Closing From:  Transitions of Care Program Patient has met all care management goals. Care Management case will be closed. Patient has been provided contact information should new needs arise.   Andrea Dimes RN, BSN [place]  [facility] Institute [facility] RN Care  Manager [phone]

## 2023-07-20 ENCOUNTER — Inpatient Hospital Stay

## 2023-07-20 ENCOUNTER — Other Ambulatory Visit: Payer: Self-pay | Admitting: Genetic Counselor

## 2023-07-20 ENCOUNTER — Encounter: Payer: Self-pay | Admitting: Genetic Counselor

## 2023-07-20 ENCOUNTER — Inpatient Hospital Stay: Attending: Hematology and Oncology | Admitting: Genetic Counselor

## 2023-07-20 DIAGNOSIS — D509 Iron deficiency anemia, unspecified: Secondary | ICD-10-CM | POA: Insufficient documentation

## 2023-07-20 DIAGNOSIS — K219 Gastro-esophageal reflux disease without esophagitis: Secondary | ICD-10-CM | POA: Insufficient documentation

## 2023-07-20 DIAGNOSIS — I10 Essential (primary) hypertension: Secondary | ICD-10-CM | POA: Insufficient documentation

## 2023-07-20 DIAGNOSIS — Z1379 Encounter for other screening for genetic and chromosomal anomalies: Secondary | ICD-10-CM

## 2023-07-20 DIAGNOSIS — C180 Malignant neoplasm of cecum: Secondary | ICD-10-CM | POA: Diagnosis not present

## 2023-07-20 DIAGNOSIS — Z800 Family history of malignant neoplasm of digestive organs: Secondary | ICD-10-CM | POA: Diagnosis not present

## 2023-07-20 DIAGNOSIS — Z803 Family history of malignant neoplasm of breast: Secondary | ICD-10-CM | POA: Insufficient documentation

## 2023-07-20 DIAGNOSIS — Z85038 Personal history of other malignant neoplasm of large intestine: Secondary | ICD-10-CM | POA: Diagnosis not present

## 2023-07-20 DIAGNOSIS — J449 Chronic obstructive pulmonary disease, unspecified: Secondary | ICD-10-CM | POA: Insufficient documentation

## 2023-07-20 DIAGNOSIS — E039 Hypothyroidism, unspecified: Secondary | ICD-10-CM | POA: Diagnosis not present

## 2023-07-20 DIAGNOSIS — I252 Old myocardial infarction: Secondary | ICD-10-CM | POA: Insufficient documentation

## 2023-07-20 DIAGNOSIS — Z8042 Family history of malignant neoplasm of prostate: Secondary | ICD-10-CM | POA: Insufficient documentation

## 2023-07-20 DIAGNOSIS — I2510 Atherosclerotic heart disease of native coronary artery without angina pectoris: Secondary | ICD-10-CM | POA: Insufficient documentation

## 2023-07-20 DIAGNOSIS — E785 Hyperlipidemia, unspecified: Secondary | ICD-10-CM | POA: Insufficient documentation

## 2023-07-20 DIAGNOSIS — Z801 Family history of malignant neoplasm of trachea, bronchus and lung: Secondary | ICD-10-CM | POA: Diagnosis not present

## 2023-07-20 LAB — GENETIC SCREENING ORDER

## 2023-07-20 NOTE — Progress Notes (Unsigned)
 REFERRING PROVIDER: Teresa Lonni HERO, MD [address] [place],  [place] [address]  PRIMARY PROVIDER:  Tanda Bleacher, MD  PRIMARY REASON FOR VISIT:  1. Adenocarcinoma of cecum (HCC)   2. Family history of breast cancer   3. Family history of prostate cancer    HISTORY OF PRESENT ILLNESS:   Dominique Barber, a 68 y.o. female, was seen for a [place] cancer genetics consultation at the request of Dr. Teresa due to a personal and family history of cancer.  Dominique Barber presents to clinic today to discuss the possibility of a hereditary predisposition to cancer, to discuss genetic testing, and to further clarify her future cancer risks, as well as potential cancer risks for family members.   CANCER HISTORY:  Dominique Barber was diagnosed with colon cancer at age 43. The tumor showed loss of expression of MLH1/PMS2 but I do not see any follow-up BRAF or methylation testing.   RISK FACTORS:  First live birth at age 42.  OCP use for approximately 3 years.  Ovaries intact: yes.  Uterus intact: yes.  Menopausal status: postmenopausal.  Mammogram within the last year: no. Number of breast biopsies: 0.  Past Medical History:  Diagnosis Date   ACS (acute coronary syndrome) (HCC) 01/06/2020   With progressive angina while troponin elevation--borderline-NON-STEMI   ANTERIOR STEMI of LAD (x2). 01/19/1996   Complicated by VF arrest, and anoxic brain injury with status epilepticus; POBA of LAD --> 6 months later, after 2 vessel CABG she had postop complication with occluded LIMA/second anterior STEMI   CAD in native artery & Grafts 01/1996   a) 1/'98: Ant STEMI => LAD POBA; b) 2/'98: 95% prox LAD @ POBA site --> BMS PCI (3.0 mm x 25 mm); c) 7/98 (5 months later) => BMS ISR of LAD--> CABGx2 (LIMA-LAD, SVG-D1 -> Emergent redo w/ SVG-LAD b/c acute LIMA occlusion); d) 01/2020: ACS -> DES PCI of SVG-LAD (75%&85%), TO of SVG-D1.   COPD (chronic obstructive pulmonary disease) (HCC)     Dyspnea    GERD (gastroesophageal reflux disease)    Headache    Hiatal hernia    Hyperlipidemia    Hypertension    Hypothyroidism    On Synthroid    Iron deficiency anemia    Irritable bowel syndrome    Followed by Dr. Renaye Sous.   Ischemic cardiomyopathy 01/03/2020   ECHO (ACS): EF 45-50% (by Cath EF 40-45%) - Apical Inferior/Apical Anteroseptal & Apex akinetic, GR II DD.    PAF (paroxysmal atrial fibrillation) (HCC) 05/03/2014   New onset - originally with RVR   Pneumonia    x 2   PONV (postoperative nausea and vomiting)    very gag reflex   S/P CABG x 2; with EMERGENT Redo x 1. 07/1996   Initially LIMA-LAD, SVG-D1 --> immediate LIMA failure post CABG with anterior MI --> urgent redo SVG-LAD beyond D2.; Widely patent grafts as of 2005 with left dominant system. Graft to the diagonal branch has a very small target vessel but the vein graft to LAD retrograde fills a large bifurcating D2 and antegrade fills a large D3.   Tobacco abuse     Past Surgical History:  Procedure Laterality Date   APPENDECTOMY     BIOPSY  05/08/2020   Procedure: BIOPSY;  Surgeon: Rollin Dover, MD;  Location: [facility];  Service: Endoscopy;;   BREAST EXCISIONAL BIOPSY Right    CESAREAN SECTION     CHOLECYSTECTOMY     COLONOSCOPY N/A 03/17/2023  Procedure: COLONOSCOPY;  Surgeon: Rollin Dover, MD;  Location: [facility];  Service: Gastroenterology;  Laterality: N/A;  biopsy   CORONARY ANGIOPLASTY  01/19/1996   Acute anterior STEMI- LAD POBA:  p-m LAD 70-80% narrowing & focal 95% stenosis in distal 3rd --> Treated with Emergent PTCA( POBA)  (Dr. FABIENE Pinion   CORONARY ANGIOPLASTY WITH STENT PLACEMENT  02/26/1996   3.0x42mm Multi-Link stent to prox/mid LAD (stenosis at recent PTCA site) (Dr. FABIENE Pinion)   CORONARY ARTERY BYPASS GRAFT  07/31/1996   x2; LIMA to LAD and SVG to 1st diagonal (Dr. KYM Laine)   CORONARY ARTERY BYPASS GRAFT  07/31/1996   x1; SVG to distal LAD (Dr. KYM Laine)   CORONARY  STENT INTERVENTION N/A 01/08/2020   Procedure: CORONARY STENT INTERVENTION;  Surgeon: Anner Alm ORN, MD;  [facility];; , SVG-LAD severe focal disease with 75% followed by 85% calcified stenosis--DES PCI (Resolute Onyx 3.5 mm x 38 mm - 3.6 mm)   ESOPHAGOGASTRODUODENOSCOPY N/A 03/17/2023   Procedure: EGD (ESOPHAGOGASTRODUODENOSCOPY);  Surgeon: Rollin Dover, MD;  Location: [facility];  Service: Gastroenterology;  Laterality: N/A;  biopsy   ESOPHAGOGASTRODUODENOSCOPY (EGD) WITH PROPOFOL  N/A 05/08/2020   Procedure: ESOPHAGOGASTRODUODENOSCOPY (EGD) WITH PROPOFOL ;  Surgeon: Rollin Dover, MD;  Location: [facility];  Service: Endoscopy;  Laterality: N/A;   LAPAROSCOPIC LYSIS OF ADHESIONS N/A 05/25/2023   Procedure: LYSIS, ADHESIONS, LAPAROSCOPIC;  Surgeon: Teresa Lonni HERO, MD;  Location: [facility];  Service: General;  Laterality: N/A;   LAPAROSCOPIC RIGHT COLECTOMY Right 05/25/2023   Procedure: COLECTOMY, RIGHT, LAPAROSCOPIC;  Surgeon: Teresa Lonni HERO, MD;  Location: [facility];  Service: General;  Laterality: Right;  LAPAROSCOPIC RIGHT HEMICOLECTOMY   LEFT HEART CATH AND CORONARY ANGIOGRAPHY  07/30/1996   normal L main, LAD w/95% stenosis just before stent, diffuse 80-85% end-stent restenosis, 1st diagonal with70-75% ostial stenosis, large optional diagonal that was normal, Cfx with 2 marginals and distal PLA (all normal), RCA normal (Dr. FABIENE Pinion)   LEFT HEART CATH AND CORONARY ANGIOGRAPHY  01/19/1996   acute anterior wall MI, anoxic encephalopahty, VF; LCfx free of disease and dominant, RCA patent, L main short and patent, LAD with 70-80% narrowing and focal 95% stenosis in distal 3rd => PTCA  (Dr. FABIENE Pinion)   LEFT HEART CATH AND CORONARY ANGIOGRAPHY  01/18/2017   acute anterior wall MI, anoxic encephalopahty, VF; LCfx free of disease and dominant, RCA patent, L main short and patent, LAD with 70-80% narrowing and focal 95% stenosis in distal 3rd  (Dr. FABIENE Pinion)    LEFT HEART CATH AND CORS/GRAFTS ANGIOGRAPHY N/A 01/08/2020   Procedure: LEFT HEART CATH AND CORS/GRAFTS ANGIOGRAPHY;  Surgeon: Anner Alm ORN, MD;  [facility]; EF 40 to 45%. Mildly elevated LVEDP. Proximal LAD 100% occlusion, mid-distal LAD fills via SVG that has collaterals to D1. Normal dominant native LCx and nondominant RCA. SVG-D1 ostial 100% CTO, SVG-LAD severe focal disease with 75% followed by 85% calcified stenosis--DES PCI   LEFT HEART CATH AND CORS/GRAFTS ANGIOGRAPHY  01/31/2003   LAD totally occluded in prox 3rd, patent Cfx, SVG to mid LAd patent, SVG to small DX1 patent, LIMA to LAD was atretic and essentially occluded in junction of prox & mid 3rd, R barciocephalic and subclavian were normal (Dr. FABIENE Pinion)   [facility] PERF WALL MOTION  10/2010; 06/2014   a) LexiScan  Cardiolite  - mild perfusion defect r/t infarct/scar with mild periifarct ischemia in mid anterior & apical anterior region; EF 67%; abnormal, low  risk study;; b) Small, moderate intensity Fixed perfusion defect - mid Anterior Wall c/w known Anterior MI. LOW RISK    OVARIAN CYST REMOVAL     TRANSTHORACIC ECHOCARDIOGRAM  02/2012; 06/2014   a) EF 50-55%, mild HK of anterospetal myocardium; mild MR;; b) EF 55-60%, mild HK of apical anterior wall.   Mild MR   TRANSTHORACIC ECHOCARDIOGRAM  01/02/2020   EF 45 to 50%. Apical inferior, apical septal and apex akinetic. GRII DD. Otherwise normal valves, normal RV. Normal atria. Normal pressures.    Social History   Socioeconomic History   Marital status: Married    Spouse name: Not on file   Number of children: 3   Years of education: Not on file   Highest education level: 12th grade  Occupational History    Employer: DISABLED  Tobacco Use   Smoking status: Former    Current packs/day: 0.00    Types: Cigarettes    Quit date: 01/04/2020    Years since quitting: 3.5   Smokeless tobacco: Never  Vaping Use   Vaping status: Never Used  Substance and Sexual  Activity   Alcohol use: No    Alcohol/week: 0.0 standard drinks of alcohol   Drug use: Never   Sexual activity: Not on file  Other Topics Concern   Not on file  Social History Narrative   Married, mother of 3 children (2 living sons age 59-26 and 78-31; her daughter was the victim of a murder that took place sometime around the time of the patient's MI. She was under significant amount of stress as her daughter had gone missing. She was never found. Finally the suspect was charged and convicted in 2009. She had sessile he stop smoking until that time frame when it brought back memories and she is now back to smoking a half pack a day.She does not get routine exercise, but is active around the house and doing housecleaning chores.She does not drink alcohol.         Family history: mgm died at 16, several maternal aunts and uncles all died in their 76's as well; mother didn't have any heart issues, had dementia, DM, died in her 31's; father lung cancer - smoker died at 68; 1 brother w/o heart issues; 2 living children - one with seizure disorder, no cholesterol issues known       Diet: doesn't eat much; has problems with GI/bloating since bypass; maybe 1-1.5 meals per day; oatmeal in the monings; sandwiches/soups in small amounts in the day; IBS issues more constipation; linzess, simethicone , maalox       Exercise:  housework   Social Drivers of Corporate investment banker Strain: Low Risk  (11/24/2022)   Overall Financial Resource Strain (CARDIA)    Difficulty of Paying Living Expenses: Not very hard  Food Insecurity: No Food Insecurity (06/12/2023)   Hunger Vital Sign    Worried About Running Out of Food in the Last Year: Never true    Ran Out of Food in the Last Year: Never true  Transportation Needs: No Transportation Needs (06/12/2023)   PRAPARE - Administrator, Civil Service (Medical): No    Lack of Transportation (Non-Medical): No  Physical Activity: Insufficiently Active  (11/24/2022)   Exercise Vital Sign    Days of Exercise per Week: 3 days    Minutes of Exercise per Session: 20 min  Stress: No Stress Concern Present (11/24/2022)   [employer] of Occupational Health - Occupational Stress Questionnaire  Feeling of Stress : Not at all  Social Connections: Unknown (05/25/2023)   Social Connection and Isolation Panel    Frequency of Communication with Friends and Family: Twice a week    Frequency of Social Gatherings with Friends and Family: Patient declined    Attends Religious Services: Patient declined    Database administrator or Organizations: No    Attends Engineer, structural: Never    Marital Status: Married     FAMILY HISTORY:  We obtained a detailed, 4-generation family history.  Significant diagnoses are listed below: Family History  Problem Relation Age of Onset   Diabetes Mother    Breast cancer Mother 61   Lung cancer Father 85 - 46   Throat cancer Father 60 - 31   Prostate cancer Brother 97   Thyroid  disease Paternal Aunt    Cancer Paternal Aunt        unknown type   Thyroid  disease Maternal Grandmother    Heart failure Other    Diabetes Other    Stomach cancer Other       Dominique Barber is unaware of previous family history of genetic testing for hereditary cancer risks. There is no reported Ashkenazi Jewish ancestry.   GENETIC COUNSELING ASSESSMENT: Dominique Barber is a 68 y.o. female with a personal and family history of cancer which is somewhat suggestive of a hereditary predisposition to cancer. We, therefore, discussed and recommended the following at today's visit.   DISCUSSION: We discussed that 5 - 10% of cancer is hereditary, with most cases of colon cancer associated with Lynch Syndrome.  There are other genes that can be associated with hereditary colon cancer syndromes.  We discussed that testing is beneficial for several reasons including knowing how to follow individuals after completing their  treatment, identifying whether potential treatment options would be beneficial, and understanding if other family members could be at risk for cancer and allowing them to undergo genetic testing.   We reviewed the characteristics, features and inheritance patterns of hereditary cancer syndromes. We also discussed genetic testing, including the appropriate family members to test, the process of testing, insurance coverage and turn-around-time for results. We discussed the implications of a negative, positive, carrier and/or variant of uncertain significant result. We recommended Dominique Barber pursue genetic testing for a panel that includes genes associated with colon, breast, prostate, and stomach cancer.   Dominique Barber  was offered a common hereditary cancer panel (40 genes) and an expanded pan-cancer panel (77 genes). Dominique Barber was informed of the benefits and limitations of each panel, including that expanded pan-cancer panels contain genes that do not have clear management guidelines at this point in time.  We also discussed that as the number of genes included on a panel increases, the chances of variants of uncertain significance increases. After considering the benefits and limitations of each gene panel, Dominique Barber elected to have [facility] CancerNext-Expanded Panel.  The CancerNext-Expanded gene panel offered by [facility] and includes sequencing, rearrangement, and RNA analysis for the following 77 genes: AIP, ALK, APC, ATM, AXIN2, BAP1, BARD1, BMPR1A, BRCA1, BRCA2, BRIP1, CDC73, CDH1, CDK4, CDKN1B, CDKN2A, CEBPA, CHEK2, CTNNA1, DDX41, DICER1, ETV6, FH, FLCN, GATA2, LZTR1, MAX, MBD4, MEN1, MET, MLH1, MSH2, MSH3, MSH6, MUTYH, NF1, NF2, NTHL1, PALB2, PHOX2B, PMS2, POT1, PRKAR1A, PTCH1, PTEN, RAD51C, RAD51D, RB1, RET, RPS20, RUNX1, SDHA, SDHAF2, SDHB, SDHC, SDHD, SMAD4, SMARCA4, SMARCB1, SMARCE1, STK11, SUFU, TMEM127, TP53, TSC1, TSC2, VHL, and WT1 (sequencing and deletion/duplication);  EGFR, HOXB13, KIT, MITF, PDGFRA,  POLD1, and POLE (sequencing only); EPCAM and GREM1 (deletion/duplication only).   Based on Dominique Barber's personal and family history of cancer, she meets medical criteria for genetic testing. She should not have an out of pocket cost with the insurance Medicare Part A/B.  PLAN: After considering the risks, benefits, and limitations, Dominique Barber provided informed consent to pursue genetic testing and the blood sample was sent to [facility] for analysis of the CancerNext-Expanded Panel. Results should be available within approximately 2-3 weeks' time, at which point they will be disclosed by telephone to Dominique Barber, as will any additional recommendations warranted by these results. Dominique Barber will receive a summary of her genetic counseling visit and a copy of her results once available. This information will also be available in Epic.   Dominique Barber questions were answered to her satisfaction today. Our contact information was provided should additional questions or concerns arise. Thank you for the referral and allowing us  to share in the care of your patient.   Leyan Branden, MS, [facility] Genetic Counselor [email or web address] (P) [phone]  40 minutes were spent on the date of the encounter in service to the patient including preparation, face-to-face consultation, documentation and care coordination.  The patient brought her husband. Drs. Gudena and/or Lanny were available to discuss this case as needed.   _______________________________________________________________________ For Office Staff:  Number of people involved in session: 2 Was an Intern/ student involved with case: no

## 2023-07-25 DIAGNOSIS — Z85038 Personal history of other malignant neoplasm of large intestine: Secondary | ICD-10-CM | POA: Diagnosis not present

## 2023-07-25 DIAGNOSIS — R1032 Left lower quadrant pain: Secondary | ICD-10-CM | POA: Diagnosis not present

## 2023-07-25 DIAGNOSIS — K5904 Chronic idiopathic constipation: Secondary | ICD-10-CM | POA: Diagnosis not present

## 2023-07-25 DIAGNOSIS — R141 Gas pain: Secondary | ICD-10-CM | POA: Diagnosis not present

## 2023-07-25 DIAGNOSIS — Z860100 Personal history of colon polyps, unspecified: Secondary | ICD-10-CM | POA: Diagnosis not present

## 2023-07-25 DIAGNOSIS — D509 Iron deficiency anemia, unspecified: Secondary | ICD-10-CM | POA: Diagnosis not present

## 2023-08-03 ENCOUNTER — Other Ambulatory Visit: Payer: Self-pay | Admitting: Cardiology

## 2023-08-03 NOTE — Telephone Encounter (Signed)
 Prescription refill request for Xarelto  received.  Indication: afib  Last office visit: Anner, 04/11/2023 Weight: 67.5 kg  Age: 68 yo  Scr: 1.10, 06/28/2023 CrCl: 52 ml/min

## 2023-08-10 ENCOUNTER — Telehealth: Payer: Self-pay | Admitting: Genetic Counselor

## 2023-08-10 ENCOUNTER — Encounter: Payer: Self-pay | Admitting: Genetic Counselor

## 2023-08-10 DIAGNOSIS — Z1379 Encounter for other screening for genetic and chromosomal anomalies: Secondary | ICD-10-CM | POA: Insufficient documentation

## 2023-08-10 NOTE — Telephone Encounter (Signed)
 I contacted Ms. Nary to discuss her genetic testing results. No pathogenic variants were identified in the 77 genes analyzed. Of note, a variant of uncertain significance was identified in the POLD1 gene. Detailed clinic note to follow.  The test report has been scanned into EPIC and is located under the Molecular Pathology section of the Results Review tab.  A portion of the result report is included below for reference.   Henreitta Spittler, MS, [facility] Genetic Counselor [email or web address] (P) [phone]

## 2023-08-15 ENCOUNTER — Ambulatory Visit: Payer: Self-pay | Admitting: Genetic Counselor

## 2023-08-15 DIAGNOSIS — Z1379 Encounter for other screening for genetic and chromosomal anomalies: Secondary | ICD-10-CM

## 2023-08-15 NOTE — Progress Notes (Signed)
 HPI:   Dominique Barber was previously seen in the [facility] due to a personal and family history of cancer and concerns regarding a hereditary predisposition to cancer. Please refer to our prior cancer genetics clinic note for more information regarding our discussion, assessment and recommendations, at the time. Dominique Barber recent genetic test results were disclosed to her, as were recommendations warranted by these results. These results and recommendations are discussed in more detail below.  CANCER HISTORY:  Oncology History   No history exists.    FAMILY HISTORY:  We obtained a detailed, 4-generation family history.  Significant diagnoses are listed below:      Family History  Problem Relation Age of Onset   Diabetes Mother     Breast cancer Mother 45   Lung cancer Father 23 - 20   Throat cancer Father 30 - 12   Prostate cancer Brother 44   Thyroid  disease Paternal Aunt     Cancer Paternal Aunt          unknown type   Thyroid  disease Maternal Grandmother     Heart failure Other     Diabetes Other     Stomach cancer Other               Dominique Barber is unaware of previous family history of genetic testing for hereditary cancer risks. There is no reported Ashkenazi Jewish ancestry.   GENETIC TEST RESULTS:  The [facility] CancerNext-Expanded Panel found no pathogenic mutations.  The CancerNext-Expanded gene panel offered by [facility] and includes sequencing, rearrangement, and RNA analysis for the following 77 genes: AIP, ALK, APC, ATM, AXIN2, BAP1, BARD1, BMPR1A, BRCA1, BRCA2, BRIP1, CDC73, CDH1, CDK4, CDKN1B, CDKN2A, CEBPA, CHEK2, CTNNA1, DDX41, DICER1, ETV6, FH, FLCN, GATA2, LZTR1, MAX, MBD4, MEN1, MET, MLH1, MSH2, MSH3, MSH6, MUTYH, NF1, NF2, NTHL1, PALB2, PHOX2B, PMS2, POT1, PRKAR1A, PTCH1, PTEN, RAD51C, RAD51D, RB1, RET, RPS20, RUNX1, SDHA, SDHAF2, SDHB, SDHC, SDHD, SMAD4, SMARCA4, SMARCB1, SMARCE1, STK11, SUFU, TMEM127, TP53, TSC1, TSC2, VHL,  and WT1 (sequencing and deletion/duplication); EGFR, HOXB13, KIT, MITF, PDGFRA, POLD1, and POLE (sequencing only); EPCAM and GREM1 (deletion/duplication only).   The test report has been scanned into EPIC and is located under the Molecular Pathology section of the Results Review tab.  A portion of the result report is included below for reference. Genetic testing reported out on 08/09/2023.      Genetic testing identified a variant of uncertain significance (VUS) in the POLD1 gene called [id number].  At this time, it is unknown if this variant is associated with an increased risk for cancer or if it is benign, but most uncertain variants are reclassified to benign. It should not be used to make medical management decisions. With time, we suspect the laboratory will determine the significance of this variant, if any. If the laboratory reclassifies this variant, we will attempt to contact Ms. Niswander to discuss it further.   Even though a pathogenic variant was not identified, possible explanations her personal history of cancer may include: There may be no hereditary risk for cancer in the family. The cancers in Dominique Barber and/or her family may be due to other genetic or environmental factors. There may be a gene mutation in one of these genes that current testing methods cannot detect, but that chance is small. There could be another gene that has not yet been discovered, or that we have not yet tested, that is responsible for the cancer diagnoses in the family.   Therefore,  it is important to remain in touch with cancer genetics in the future so that we can continue to offer [name] the most up to date genetic testing.   ADDITIONAL GENETIC TESTING:  We discussed with Dominique Barber that her genetic testing was fairly extensive.  If there are genes identified to increase cancer risk that can be analyzed in the future, we would be happy to discuss and coordinate this testing at that time.     CANCER SCREENING RECOMMENDATIONS:  Dominique Barber test result is considered negative (normal).  This means that we have not identified a hereditary cause for her personal and family history of cancer at this time.   An individual's cancer risk and medical management are not determined by genetic test results alone. Overall cancer risk assessment incorporates additional factors, including personal medical history, family history, and any available genetic information that may result in a personalized plan for cancer prevention and surveillance. Therefore, it is recommended she continue to follow the cancer management and screening guidelines provided by her oncology and primary healthcare provider.  Based on the reported personal and family history, specific cancer screenings for Dominique Barber and her family include:  Breast Cancer Screening:  The Tyrer-Cuzick model is one of multiple prediction models developed to estimate an individual's lifetime risk of developing breast cancer. The Tyrer-Cuzick model is endorsed by the [employer] (NCCN). This model includes many risk factors such as family history, endogenous estrogen exposure, and benign breast disease. The calculation is highly-dependent on the accuracy of clinical data provided by the patient and can change over time. The Tyrer-Cuzick model may be repeated to reflect new information in her personal or family history in the future.   Dominique Barber's Tyrer-Cuzick risk score is 8.5%. She is encouraged to continue to be mindful of her family history and be diligent with general population breast screening, including annual mammograms.  She is encouraged to contact us  regarding any changes to her personal or family history, as her recommendations for screening would be altered significantly if her lifetime risk is determined to be greater than 20% based on updated information.    Colon Cancer Screening: Due to  Dominique Barber's personal history of colon cancer, her children are recommended to begin colonoscopies at age 43 and repeat every 5 years. More frequent colonoscopies may be recommended if polyps are identified.  RECOMMENDATIONS FOR FAMILY MEMBERS:   Since she did not inherit a mutation in a cancer predisposition gene included on this panel, her children could not have inherited a mutation from her in one of these genes. We do not recommend familial testing for the POLD1 variant of uncertain significance (VUS).  FOLLOW-UP:  Cancer genetics is a rapidly advancing field and it is possible that new genetic tests will be appropriate for her and/or her family members in the future. We encouraged her to remain in contact with cancer genetics on an annual basis so we can update her personal and family histories and let her know of advances in cancer genetics that may benefit this family.   Our contact number was provided. Ms. Witts questions were answered to her satisfaction, and she knows she is welcome to call us  at anytime with additional questions or concerns.   Jaryan Chicoine, MS, [facility] Genetic Counselor [email or web address] (P) [phone]

## 2023-08-24 ENCOUNTER — Ambulatory Visit (INDEPENDENT_AMBULATORY_CARE_PROVIDER_SITE_OTHER)

## 2023-08-24 VITALS — Ht 62.0 in | Wt 150.0 lb

## 2023-08-24 DIAGNOSIS — Z0000 Encounter for general adult medical examination without abnormal findings: Secondary | ICD-10-CM | POA: Diagnosis not present

## 2023-08-24 NOTE — Progress Notes (Signed)
 Subjective:   Dominique Barber is a 68 y.o. who presents for a Medicare Wellness preventive visit.  As a reminder, Annual Wellness Visits don't include a physical exam, and some assessments may be limited, especially if this visit is performed virtually. We may recommend an in-person follow-up visit with your provider if needed.  Visit Complete: Virtual I connected with  Dominique Barber on 08/24/23 by a audio enabled telemedicine application and verified that I am speaking with the correct person using two identifiers.  Patient Location: Home  Provider Location: Office/Clinic  I discussed the limitations of evaluation and management by telemedicine. The patient expressed understanding and agreed to proceed.  Vital Signs: Because this visit was a virtual/telehealth visit, some criteria may be missing or patient reported. Any vitals not documented were not able to be obtained and vitals that have been documented are patient reported.  VideoDeclined- This patient declined Librarian, academic. Therefore the visit was completed with audio only.  Persons Participating in Visit: Patient.  AWV Questionnaire: No: Patient Medicare AWV questionnaire was not completed prior to this visit.  Cardiac Risk Factors include: advanced age (>49men, >44 women);hypertension     Objective:    Today's Vitals   08/24/23 1452 08/24/23 1453  Weight: 150 lb (68 kg)   Height: 5' 2 (1.575 m)   PainSc:  4    Body mass index is 27.44 kg/m.     08/24/2023    3:03 PM 05/25/2023    3:00 PM 05/16/2023    2:11 PM 05/07/2023    2:54 PM 03/17/2023    9:07 AM 05/19/2022    4:36 PM 01/22/2022    2:49 PM  Advanced Directives  Does Patient Have a Medical Advance Directive? No No No No No No No  Would patient like information on creating a medical advance directive? No - Patient declined No - Patient declined  No - Patient declined Yes (MAU/Ambulatory/Procedural Areas - Information  given)      Current Medications (verified) Outpatient Encounter Medications as of 08/24/2023  Medication Sig   acetaminophen  (TYLENOL ) 500 MG tablet Take 500 mg by mouth every 6 (six) hours as needed for mild pain.   albuterol  (VENTOLIN  HFA) 108 (90 Base) MCG/ACT inhaler Inhale 1-2 puffs into the lungs every 6 (six) hours as needed for wheezing or shortness of breath.   Alpha-D-Galactosidase (BEANO PO) Take 2 capsules by mouth See admin instructions. Take 2 capsules after lunch and dinner   Calcium  Carb-Cholecalciferol  (CALCIUM +D3 PO) Take 1 tablet by mouth at bedtime.   cetirizine  (ZYRTEC ) 10 MG tablet TAKE 1 TABLET DAILY   Evolocumab  (REPATHA  SURECLICK) 140 MG/ML SOAJ Inject 140 mg into the skin every 14 (fourteen) days.   levothyroxine  (SYNTHROID ) 50 MCG tablet TAKE ONE AND ONE-HALF TABLETS DAILY BEFORE BREAKFAST   loperamide  (IMODIUM ) 2 MG capsule Take 1 capsule (2 mg total) by mouth daily.   methocarbamol  (ROBAXIN ) 500 MG tablet Take 1 tablet (500 mg total) by mouth every 8 (eight) hours as needed for muscle spasms.   nitroGLYCERIN  (NITROSTAT ) 0.4 MG SL tablet Place 1 tablet (0.4 mg total) under the tongue every 5 (five) minutes x 3 doses as needed for chest pain.   Peppermint Oil (IBGARD PO) Take 1-2 tablets by mouth See admin instructions. Take 2 after lunch and 1 tablet after supper, may take a 1 tablet dose as needed for IBS symptoms   polyethylene glycol powder (GLYCOLAX /MIRALAX ) 17 GM/SCOOP powder Take 12.75 g by mouth in  the morning.   potassium chloride  (KLOR-CON ) 10 MEQ tablet Take 10 mEq by mouth daily.   Probiotic Product (ALIGN) 4 MG CAPS Take 4 mg by mouth in the morning.   rivaroxaban  (XARELTO ) 20 MG TABS tablet TAKE 1 TABLET DAILY WITH SUPPER   Simethicone  125 MG TABS Take 125-250 mg by mouth See admin instructions. Take 250 mg by mouth after lunch and 125 mg after each additional meal   metoprolol  succinate (TOPROL -XL) 25 MG 24 hr tablet TAKE 1 TABLET TWICE A DAY  (DISCONTINUE 50 MG)   potassium chloride  SA (KLOR-CON  M) 20 MEQ tablet Take 1 tablet (20 mEq total) by mouth daily. (Patient not taking: Reported on 08/24/2023)   [DISCONTINUED] amiodarone  (PACERONE ) 200 MG tablet Take 2 tablets (400 mg total) by mouth daily for 7 days, THEN 1 tablet (200 mg total) daily for 7 days. Start 400 mg on 05/18/23  and start the 200 mg  after surgery for one week  then stop.. (Patient not taking: No sig reported)   No facility-administered encounter medications on file as of 08/24/2023.    Allergies (verified) Iodinated contrast media, Linzess [linaclotide], Atorvastatin , Other, Milk-related compounds, Rosuvastatin  calcium , Simvastatin, Eluxadoline, Iohexol , Marinol [dronabinol], and Red dye #40 (allura red)   History: Past Medical History:  Diagnosis Date   ACS (acute coronary syndrome) (HCC) 01/06/2020   With progressive angina while troponin elevation--borderline-NON-STEMI   ANTERIOR STEMI of LAD (x2). 01/19/1996   Complicated by VF arrest, and anoxic brain injury with status epilepticus; POBA of LAD --> 6 months later, after 2 vessel CABG she had postop complication with occluded LIMA/second anterior STEMI   CAD in native artery & Grafts 01/1996   a) 1/'98: Ant STEMI => LAD POBA; b) 2/'98: 95% prox LAD @ POBA site --> BMS PCI (3.0 mm x 25 mm); c) 7/98 (5 months later) => BMS ISR of LAD--> CABGx2 (LIMA-LAD, SVG-D1 -> Emergent redo w/ SVG-LAD b/c acute LIMA occlusion); d) 01/2020: ACS -> DES PCI of SVG-LAD (75%&85%), TO of SVG-D1.   COPD (chronic obstructive pulmonary disease) (HCC)    Dyspnea    GERD (gastroesophageal reflux disease)    Headache    Hiatal hernia    Hyperlipidemia    Hypertension    Hypothyroidism    On Synthroid    Iron deficiency anemia    Irritable bowel syndrome    Followed by Dr. Renaye Sous.   Ischemic cardiomyopathy 01/03/2020   ECHO (ACS): EF 45-50% (by Cath EF 40-45%) - Apical Inferior/Apical Anteroseptal & Apex akinetic, GR II DD.     PAF (paroxysmal atrial fibrillation) (HCC) 05/03/2014   New onset - originally with RVR   Pneumonia    x 2   PONV (postoperative nausea and vomiting)    very gag reflex   S/P CABG x 2; with EMERGENT Redo x 1. 07/1996   Initially LIMA-LAD, SVG-D1 --> immediate LIMA failure post CABG with anterior MI --> urgent redo SVG-LAD beyond D2.; Widely patent grafts as of 2005 with left dominant system. Graft to the diagonal branch has a very small target vessel but the vein graft to LAD retrograde fills a large bifurcating D2 and antegrade fills a large D3.   Tobacco abuse    Past Surgical History:  Procedure Laterality Date   APPENDECTOMY     BIOPSY  05/08/2020   Procedure: BIOPSY;  Surgeon: Rollin Dover, MD;  Location: [facility];  Service: Endoscopy;;   BREAST EXCISIONAL BIOPSY Right    CESAREAN SECTION  CHOLECYSTECTOMY     COLONOSCOPY N/A 03/17/2023   Procedure: COLONOSCOPY;  Surgeon: Rollin Dover, MD;  Location: [facility];  Service: Gastroenterology;  Laterality: N/A;  biopsy   CORONARY ANGIOPLASTY  01/19/1996   Acute anterior STEMI- LAD POBA:  p-m LAD 70-80% narrowing & focal 95% stenosis in distal 3rd --> Treated with Emergent PTCA( POBA)  (Dr. FABIENE Pinion   CORONARY ANGIOPLASTY WITH STENT PLACEMENT  02/26/1996   3.0x38mm Multi-Link stent to prox/mid LAD (stenosis at recent PTCA site) (Dr. FABIENE Pinion)   CORONARY ARTERY BYPASS GRAFT  07/31/1996   x2; LIMA to LAD and SVG to 1st diagonal (Dr. KYM Laine)   CORONARY ARTERY BYPASS GRAFT  07/31/1996   x1; SVG to distal LAD (Dr. KYM Laine)   CORONARY STENT INTERVENTION N/A 01/08/2020   Procedure: CORONARY STENT INTERVENTION;  Surgeon: Anner Alm ORN, MD;  Location: [facility];; , SVG-LAD severe focal disease with 75% followed by 85% calcified stenosis--DES PCI (Resolute Onyx 3.5 mm x 38 mm - 3.6 mm)   ESOPHAGOGASTRODUODENOSCOPY N/A 03/17/2023   Procedure: EGD (ESOPHAGOGASTRODUODENOSCOPY);  Surgeon: Rollin Dover, MD;  Location: [facility];  Service: Gastroenterology;  Laterality: N/A;  biopsy   ESOPHAGOGASTRODUODENOSCOPY (EGD) WITH PROPOFOL  N/A 05/08/2020   Procedure: ESOPHAGOGASTRODUODENOSCOPY (EGD) WITH PROPOFOL ;  Surgeon: Rollin Dover, MD;  Location: [facility];  Service: Endoscopy;  Laterality: N/A;   LAPAROSCOPIC LYSIS OF ADHESIONS N/A 05/25/2023   Procedure: LYSIS, ADHESIONS, LAPAROSCOPIC;  Surgeon: Teresa Lonni HERO, MD;  Location: [facility];  Service: General;  Laterality: N/A;   LAPAROSCOPIC RIGHT COLECTOMY Right 05/25/2023   Procedure: COLECTOMY, RIGHT, LAPAROSCOPIC;  Surgeon: Teresa Lonni HERO, MD;  Location: [facility];  Service: General;  Laterality: Right;  LAPAROSCOPIC RIGHT HEMICOLECTOMY   LEFT HEART CATH AND CORONARY ANGIOGRAPHY  07/30/1996   normal L main, LAD w/95% stenosis just before stent, diffuse 80-85% end-stent restenosis, 1st diagonal with70-75% ostial stenosis, large optional diagonal that was normal, Cfx with 2 marginals and distal PLA (all normal), RCA normal (Dr. FABIENE Pinion)   LEFT HEART CATH AND CORONARY ANGIOGRAPHY  01/19/1996   acute anterior wall MI, anoxic encephalopahty, VF; LCfx free of disease and dominant, RCA patent, L main short and patent, LAD with 70-80% narrowing and focal 95% stenosis in distal 3rd => PTCA  (Dr. FABIENE Pinion)   LEFT HEART CATH AND CORONARY ANGIOGRAPHY  01/18/2017   acute anterior wall MI, anoxic encephalopahty, VF; LCfx free of disease and dominant, RCA patent, L main short and patent, LAD with 70-80% narrowing and focal 95% stenosis in distal 3rd  (Dr. FABIENE Pinion)   LEFT HEART CATH AND CORS/GRAFTS ANGIOGRAPHY N/A 01/08/2020   Procedure: LEFT HEART CATH AND CORS/GRAFTS ANGIOGRAPHY;  Surgeon: Anner Alm ORN, MD;  Location: [facility]; EF 40 to 45%. Mildly elevated LVEDP. Proximal LAD 100% occlusion, mid-distal LAD fills via SVG that has collaterals to D1. Normal dominant native LCx and nondominant RCA. SVG-D1 ostial 100% CTO, SVG-LAD severe focal  disease with 75% followed by 85% calcified stenosis--DES PCI   LEFT HEART CATH AND CORS/GRAFTS ANGIOGRAPHY  01/31/2003   LAD totally occluded in prox 3rd, patent Cfx, SVG to mid LAd patent, SVG to small DX1 patent, LIMA to LAD was atretic and essentially occluded in junction of prox & mid 3rd, R barciocephalic and subclavian were normal (Dr. FABIENE Pinion)   [facility] PERF WALL MOTION  10/2010; 06/2014   a) LexiScan  Cardiolite  - mild perfusion defect r/t infarct/scar with mild periifarct ischemia in  mid anterior & apical anterior region; EF 67%; abnormal, low risk study;; b) Small, moderate intensity Fixed perfusion defect - mid Anterior Wall c/w known Anterior MI. LOW RISK    OVARIAN CYST REMOVAL     TRANSTHORACIC ECHOCARDIOGRAM  02/2012; 06/2014   a) EF 50-55%, mild HK of anterospetal myocardium; mild MR;; b) EF 55-60%, mild HK of apical anterior wall.   Mild MR   TRANSTHORACIC ECHOCARDIOGRAM  01/02/2020   EF 45 to 50%. Apical inferior, apical septal and apex akinetic. GRII DD. Otherwise normal valves, normal RV. Normal atria. Normal pressures.   Family History  Problem Relation Age of Onset   Diabetes Mother    Breast cancer Mother 63   Lung cancer Father 47 - 1   Throat cancer Father 9 - 40   Prostate cancer Brother 7   Thyroid  disease Paternal Aunt    Cancer Paternal Aunt        unknown type   Thyroid  disease Maternal Grandmother    Heart failure Other    Diabetes Other    Stomach cancer Other    Social History   Socioeconomic History   Marital status: Married    Spouse name: Not on file   Number of children: 3   Years of education: Not on file   Highest education level: 12th grade  Occupational History    Employer: DISABLED  Tobacco Use   Smoking status: Former    Current packs/day: 0.00    Types: Cigarettes    Quit date: 01/04/2020    Years since quitting: 3.6   Smokeless tobacco: Never  Vaping Use   Vaping status: Never Used  Substance and Sexual Activity   Alcohol  use: No    Alcohol/week: 0.0 standard drinks of alcohol   Drug use: Never   Sexual activity: Not on file  Other Topics Concern   Not on file  Social History Narrative   Married, mother of 3 children (2 living sons age 103-26 and 59-31; her daughter was the victim of a murder that took place sometime around the time of the patient's MI. She was under significant amount of stress as her daughter had gone missing. She was never found. Finally the suspect was charged and convicted in 2009. She had sessile he stop smoking until that time frame when it brought back memories and she is now back to smoking a half pack a day.She does not get routine exercise, but is active around the house and doing housecleaning chores.She does not drink alcohol.         Family history: mgm died at 37, several maternal aunts and uncles all died in their 74's as well; mother didn't have any heart issues, had dementia, DM, died in her 72's; father lung cancer - smoker died at 43; 1 brother w/o heart issues; 2 living children - one with seizure disorder, no cholesterol issues known       Diet: doesn't eat much; has problems with GI/bloating since bypass; maybe 1-1.5 meals per day; oatmeal in the monings; sandwiches/soups in small amounts in the day; IBS issues more constipation; linzess, simethicone , maalox       Exercise:  housework   Social Drivers of Corporate investment banker Strain: Low Risk  (08/24/2023)   Overall Financial Resource Strain (CARDIA)    Difficulty of Paying Living Expenses: Not hard at all  Food Insecurity: No Food Insecurity (08/24/2023)   Hunger Vital Sign    Worried About Running Out of Food  in the Last Year: Never true    Ran Out of Food in the Last Year: Never true  Transportation Needs: No Transportation Needs (08/24/2023)   PRAPARE - Administrator, Civil Service (Medical): No    Lack of Transportation (Non-Medical): No  Physical Activity: Sufficiently Active (08/24/2023)    Exercise Vital Sign    Days of Exercise per Week: 3 days    Minutes of Exercise per Session: 60 min  Stress: No Stress Concern Present (08/24/2023)   [employer] of Occupational Health - Occupational Stress Questionnaire    Feeling of Stress: Not at all  Social Connections: Socially Isolated (08/24/2023)   Social Connection and Isolation Panel    Frequency of Communication with Friends and Family: Once a week    Frequency of Social Gatherings with Friends and Family: Never    Attends Religious Services: Never    Database administrator or Organizations: No    Attends Engineer, structural: Never    Marital Status: Married    Tobacco Counseling Counseling given: Not Answered    Clinical Intake:  Pre-visit preparation completed: Yes  Pain : 0-10 Pain Score: 4  Pain Type: Chronic pain Pain Location: Abdomen Pain Descriptors / Indicators: Aching Pain Onset: More than a month ago Pain Frequency: Intermittent     BMI - recorded: 27.44 Nutritional Status: BMI 25 -29 Overweight Nutritional Risks: None Diabetes: No  Lab Results  Component Value Date   HGBA1C 6.3 (H) 05/27/2023   HGBA1C 6.6 (H) 11/28/2022   HGBA1C 5.6 05/09/2016     How often do you need to have someone help you when you read instructions, pamphlets, or other written materials from your doctor or pharmacy?: 1 - Never  Interpreter Needed?: No  Information entered by :: Ellouise Haws, LPN   Activities of Daily Living     08/24/2023    2:57 PM 05/25/2023    3:00 PM  In your present state of health, do you have any difficulty performing the following activities:  Hearing? 0 0  Vision? 0 0  Difficulty concentrating or making decisions? 0 0  Walking or climbing stairs? 1   Comment SOB   Dressing or bathing? 0   Doing errands, shopping? 0 0  Preparing Food and eating ? N   Using the Toilet? N   In the past six months, have you accidently leaked urine? N   Do you have problems with loss  of bowel control? N   Managing your Medications? N   Managing your Finances? N   Housekeeping or managing your Housekeeping? N     Patient Care Team: Tanda Bleacher, MD as PCP - General (Family Medicine) Anner Alm ORN, MD as PCP - Cardiology (Cardiology)  I have updated your Care Teams any recent Medical Services you may have received from other providers in the past year.     Assessment:   This is a routine wellness examination for [facility].  Hearing/Vision screen Hearing Screening - Comments:: Pt denies any hearing issues  Vision Screening - Comments:: Wears rx glasses - up to date with routine eye exams with [facility] by [facility]    Goals Addressed               This Visit's Progress     Patient Stated (pt-stated)        Watch what I eat        Depression Screen     08/24/2023    2:59  PM 06/12/2023   11:44 AM 03/28/2023    3:43 PM 11/24/2022    3:34 PM 08/17/2022    3:36 PM 05/24/2022    2:52 PM 05/19/2022    4:36 PM  PHQ 2/9 Scores  PHQ - 2 Score 0 0 0 0 0 0 0  PHQ- 9 Score     0 0     Fall Risk     08/24/2023    3:03 PM 06/28/2023    4:06 PM 06/21/2023    2:50 PM 03/28/2023    3:43 PM 11/24/2022    3:34 PM  Fall Risk   Falls in the past year? 0 0 0 0   Number falls in past yr: 0 0  0   Injury with Fall? 0 0  0 0  Risk for fall due to : No Fall Risks No Fall Risks  No Fall Risks   Follow up Falls prevention discussed   Falls evaluation completed     MEDICARE RISK AT HOME:  Medicare Risk at Home Any stairs in or around the home?: Yes If so, are there any without handrails?: No Home free of loose throw rugs in walkways, pet beds, electrical cords, etc?: Yes Adequate lighting in your home to reduce risk of falls?: Yes Life alert?: No Use of a cane, walker or w/c?: No Grab bars in the bathroom?: No Shower chair or bench in shower?: Yes Elevated toilet seat or a handicapped toilet?: No  TIMED UP AND GO:  Was the test performed?  No  Cognitive  Function: 6CIT completed        08/24/2023    3:04 PM 05/19/2022    4:37 PM  6CIT Screen  What Year? 0 points 0 points  What month? 0 points 0 points  What time? 0 points 0 points  Count back from 20 0 points 0 points  Months in reverse 0 points 0 points  Repeat phrase 0 points 0 points  Total Score 0 points 0 points    Immunizations Immunization History  Administered Date(s) Administered   Influenza,inj,Quad PF,6+ Mos 12/04/2013, 12/05/2014, 10/10/2017   Influenza-Unspecified 01/03/2010   Tdap 01/03/2013    Screening Tests Health Maintenance  Topic Date Due   COVID-19 Vaccine (1) Never done   Pneumococcal Vaccine: 50+ Years (1 of 2 - PCV) Never done   Zoster Vaccines- Shingrix (1 of 2) Never done   DEXA SCAN  07/07/2020   DTaP/Tdap/Td (2 - Td or Tdap) 03/27/2024 (Originally 01/04/2023)   MAMMOGRAM  07/13/2024   Medicare Annual Wellness (AWV)  08/23/2024   Colonoscopy  03/16/2033   Hepatitis C Screening  Completed   HPV VACCINES  Aged Out   Meningococcal B Vaccine  Aged Out   INFLUENZA VACCINE  Discontinued    Health Maintenance  Health Maintenance Due  Topic Date Due   COVID-19 Vaccine (1) Never done   Pneumococcal Vaccine: 50+ Years (1 of 2 - PCV) Never done   Zoster Vaccines- Shingrix (1 of 2) Never done   DEXA SCAN  07/07/2020   Health Maintenance Items Addressed: See Nurse Notes at the end of this note  Additional Screening:  Vision Screening: Recommended annual ophthalmology exams for early detection of glaucoma and other disorders of the eye. Would you like a referral to an eye doctor? No    Dental Screening: Recommended annual dental exams for proper oral hygiene  Community Resource Referral / Chronic Care Management: CRR required this visit?  No   CCM required  this visit?  No   Plan:    I have personally reviewed and noted the following in the patient's chart:   Medical and social history Use of alcohol, tobacco or illicit drugs  Current  medications and supplements including opioid prescriptions. Patient is not currently taking opioid prescriptions. Functional ability and status Nutritional status Physical activity Advanced directives List of other physicians Hospitalizations, surgeries, and ER visits in previous 12 months Vitals Screenings to include cognitive, depression, and falls Referrals and appointments  In addition, I have reviewed and discussed with patient certain preventive protocols, quality metrics, and best practice recommendations. A written personalized care plan for preventive services as well as general preventive health recommendations were provided to patient.   Ellouise VEAR Haws, LPN   [id number]   After Visit Summary: (MyChart) Due to this being a telephonic visit, the after visit summary with patients personalized plan was offered to patient via MyChart   Notes: PCP Follow Up Recommendations: Pt declined dex scan at this time may can be scheduled with mammogram at later date

## 2023-08-24 NOTE — Patient Instructions (Signed)
 Dominique Barber , Thank you for taking time out of your busy schedule to complete your Annual Wellness Visit with me. I enjoyed our conversation and look forward to speaking with you again next year. I, as well as your care team,  appreciate your ongoing commitment to your health goals. Please review the following plan we discussed and let me know if I can assist you in the future. Your Game plan/ To Do List    Referrals: If you haven't heard from the office you've been referred to, please reach out to them at the phone provided.   Follow up Visits: We will see or speak with you next year for your Next Medicare AWV with our clinical staff Have you seen your provider in the last 6 months (3 months if uncontrolled diabetes)? No  Clinician Recommendations:  Aim for 30 minutes of exercise or brisk walking, 6-8 glasses of water, and 5 servings of fruits and vegetables each day.      This is a list of the screenings recommended for you:  Health Maintenance  Topic Date Due   COVID-19 Vaccine (1) Never done   Pneumococcal Vaccine for age over 81 (1 of 2 - PCV) Never done   Zoster (Shingles) Vaccine (1 of 2) Never done   DEXA scan (bone density measurement)  07/07/2020   Medicare Annual Wellness Visit  05/19/2023   DTaP/Tdap/Td vaccine (2 - Td or Tdap) 03/27/2024*   Mammogram  07/13/2024   Colon Cancer Screening  03/16/2033   Hepatitis C Screening  Completed   HPV Vaccine  Aged Out   Meningitis B Vaccine  Aged Out   Flu Shot  Discontinued  *Topic was postponed. The date shown is not the original due date.    Advanced directives: (Declined) Advance directive discussed with you today. Even though you declined this today, please call our office should you change your mind, and we can give you the proper paperwork for you to fill out. Advance Care Planning is important because it:  [x]  Makes sure you receive the medical care that is consistent with your values, goals, and preferences  [x]  It  provides guidance to your family and loved ones and reduces their decisional burden about whether or not they are making the right decisions based on your wishes.  Follow the link provided in your after visit summary or read over the paperwork we have mailed to you to help you started getting your Advance Directives in place. If you need assistance in completing these, please reach out to us  so that we can help you!  See attachments for Preventive Care and Fall Prevention Tips.

## 2023-09-14 ENCOUNTER — Ambulatory Visit: Admitting: Endocrinology

## 2023-09-14 ENCOUNTER — Encounter: Payer: Self-pay | Admitting: Endocrinology

## 2023-09-14 VITALS — BP 118/80 | HR 73 | Resp 20 | Ht 62.0 in | Wt 157.4 lb

## 2023-09-14 DIAGNOSIS — E0520 Thyrotoxicosis with toxic multinodular goiter without thyrotoxic crisis or storm: Secondary | ICD-10-CM

## 2023-09-14 DIAGNOSIS — E890 Postprocedural hypothyroidism: Secondary | ICD-10-CM

## 2023-09-14 LAB — TSH: TSH: 2.25 m[IU]/L (ref 0.40–4.50)

## 2023-09-14 LAB — T4, FREE: Free T4: 1.6 ng/dL (ref 0.8–1.8)

## 2023-09-14 NOTE — Progress Notes (Signed)
 Outpatient Endocrinology Note [place] Daryl Quiros, MD   Patient's Name: Dominique Barber    DOB: April 09, 1955    MRN: [id number]  REASON OF VISIT: Follow-up for hypothyroidism  PCP: Tanda Bleacher, MD  HISTORY OF PRESENT ILLNESS:   Dominique Barber is a 68 y.o. old female with past medical history as listed below is presented for a follow up for postablative hypothyroidism.   Pertinent Thyroid  History: Patient was previously seen and was last time seen by Dr. Von in June 2024.  Patient has postablative hypothyroidism first diagnosed in 1999 after radioactive iodine treatment for toxic nodular goiter.  She has been on thyroid  hormone replacement since her diagnosis.  In 2016 she had low TSH of 0.13 and levothyroxine  was decreased from 100 to 75 mg daily.  She has been on a stable dose of levothyroxine  75 mcg, daily taking 50 mcg tablet 1-1/2 tablets daily.   The patient had taken Synthroid  brand name for some time, but lately taking generic.  Because of fear of allergy from dyes she is taking 50 mcg Synthroid  tablet and using 1-1/2 tablets daily. Based on prior endocrinology note, patient does not recall having allergic reaction  # Goiter : She has had a multinodular goiter since her 68s. Details of this are also not available at present. She recalls that she has had a benign biopsy in the past. No symptoms of difficulty swallowing or local pressure in the neck   Interval history Patient presented for the annual follow-up of hypothyroidism.  She was seen in this clinic by Dr. Von in June 2024.  She has been taking levothyroxine  50 mcg 1-1/2 tablets daily.  She has been taking daily in the morning before breakfast.  She reports compliance.  She does not take vitamin D supplement or iron supplement in the morning.  She denies palpitation or heat intolerance.  Lab results in May was mildly elevated free T4 and normal TSH as follows.  No neck compressive symptoms or more neck discomfort.   Latest  Reference Range & Units 05/31/23 05:04  TSH 0.350 - 4.500 uIU/mL 1.078  T4,Free(Direct) 0.61 - 1.12 ng/dL 8.85 (H)  (H): Data is abnormally high  REVIEW OF SYSTEMS:  As per history of present illness.   PAST MEDICAL HISTORY: Past Medical History:  Diagnosis Date   ACS (acute coronary syndrome) (HCC) 01/06/2020   With progressive angina while troponin elevation--borderline-NON-STEMI   ANTERIOR STEMI of LAD (x2). 01/19/1996   Complicated by VF arrest, and anoxic brain injury with status epilepticus; POBA of LAD --> 6 months later, after 2 vessel CABG she had postop complication with occluded LIMA/second anterior STEMI   CAD in native artery & Grafts 01/1996   a) 1/'98: Ant STEMI => LAD POBA; b) 2/'98: 95% prox LAD @ POBA site --> BMS PCI (3.0 mm x 25 mm); c) 7/98 (5 months later) => BMS ISR of LAD--> CABGx2 (LIMA-LAD, SVG-D1 -> Emergent redo w/ SVG-LAD b/c acute LIMA occlusion); d) 01/2020: ACS -> DES PCI of SVG-LAD (75%&85%), TO of SVG-D1.   COPD (chronic obstructive pulmonary disease) (HCC)    Dyspnea    GERD (gastroesophageal reflux disease)    Headache    Hiatal hernia    Hyperlipidemia    Hypertension    Hypothyroidism    On Synthroid    Iron deficiency anemia    Irritable bowel syndrome    Followed by Dr. Renaye Sous.   Ischemic cardiomyopathy 01/03/2020   ECHO (ACS): EF 45-50% (by Cath EF  40-45%) - Apical Inferior/Apical Anteroseptal & Apex akinetic, GR II DD.    PAF (paroxysmal atrial fibrillation) (HCC) 05/03/2014   New onset - originally with RVR   Pneumonia    x 2   PONV (postoperative nausea and vomiting)    very gag reflex   S/P CABG x 2; with EMERGENT Redo x 1. 07/1996   Initially LIMA-LAD, SVG-D1 --> immediate LIMA failure post CABG with anterior MI --> urgent redo SVG-LAD beyond D2.; Widely patent grafts as of 2005 with left dominant system. Graft to the diagonal branch has a very small target vessel but the vein graft to LAD retrograde fills a large bifurcating D2  and antegrade fills a large D3.   Tobacco abuse     PAST SURGICAL HISTORY: Past Surgical History:  Procedure Laterality Date   APPENDECTOMY     BIOPSY  05/08/2020   Procedure: BIOPSY;  Surgeon: Rollin Dover, MD;  Location: [facility];  Service: Endoscopy;;   BREAST EXCISIONAL BIOPSY Right    CESAREAN SECTION     CHOLECYSTECTOMY     COLONOSCOPY N/A 03/17/2023   Procedure: COLONOSCOPY;  Surgeon: Rollin Dover, MD;  Location: [facility];  Service: Gastroenterology;  Laterality: N/A;  biopsy   CORONARY ANGIOPLASTY  01/19/1996   Acute anterior STEMI- LAD POBA:  p-m LAD 70-80% narrowing & focal 95% stenosis in distal 3rd --> Treated with Emergent PTCA( POBA)  (Dr. FABIENE Pinion   CORONARY ANGIOPLASTY WITH STENT PLACEMENT  02/26/1996   3.0x20mm Multi-Link stent to prox/mid LAD (stenosis at recent PTCA site) (Dr. FABIENE Pinion)   CORONARY ARTERY BYPASS GRAFT  07/31/1996   x2; LIMA to LAD and SVG to 1st diagonal (Dr. KYM Laine)   CORONARY ARTERY BYPASS GRAFT  07/31/1996   x1; SVG to distal LAD (Dr. KYM Laine)   CORONARY STENT INTERVENTION N/A 01/08/2020   Procedure: CORONARY STENT INTERVENTION;  Surgeon: Anner Alm ORN, MD;  Location: [facility];; , SVG-LAD severe focal disease with 75% followed by 85% calcified stenosis--DES PCI (Resolute Onyx 3.5 mm x 38 mm - 3.6 mm)   ESOPHAGOGASTRODUODENOSCOPY N/A 03/17/2023   Procedure: EGD (ESOPHAGOGASTRODUODENOSCOPY);  Surgeon: Rollin Dover, MD;  Location: [facility];  Service: Gastroenterology;  Laterality: N/A;  biopsy   ESOPHAGOGASTRODUODENOSCOPY (EGD) WITH PROPOFOL  N/A 05/08/2020   Procedure: ESOPHAGOGASTRODUODENOSCOPY (EGD) WITH PROPOFOL ;  Surgeon: Rollin Dover, MD;  Location: [facility];  Service: Endoscopy;  Laterality: N/A;   LAPAROSCOPIC LYSIS OF ADHESIONS N/A 05/25/2023   Procedure: LYSIS, ADHESIONS, LAPAROSCOPIC;  Surgeon: Teresa Lonni HERO, MD;  Location: [facility];  Service: General;  Laterality: N/A;   LAPAROSCOPIC RIGHT  COLECTOMY Right 05/25/2023   Procedure: COLECTOMY, RIGHT, LAPAROSCOPIC;  Surgeon: Teresa Lonni HERO, MD;  Location: [facility];  Service: General;  Laterality: Right;  LAPAROSCOPIC RIGHT HEMICOLECTOMY   LEFT HEART CATH AND CORONARY ANGIOGRAPHY  07/30/1996   normal L main, LAD w/95% stenosis just before stent, diffuse 80-85% end-stent restenosis, 1st diagonal with70-75% ostial stenosis, large optional diagonal that was normal, Cfx with 2 marginals and distal PLA (all normal), RCA normal (Dr. FABIENE Pinion)   LEFT HEART CATH AND CORONARY ANGIOGRAPHY  01/19/1996   acute anterior wall MI, anoxic encephalopahty, VF; LCfx free of disease and dominant, RCA patent, L main short and patent, LAD with 70-80% narrowing and focal 95% stenosis in distal 3rd => PTCA  (Dr. FABIENE Pinion)   LEFT HEART CATH AND CORONARY ANGIOGRAPHY  01/18/2017   acute anterior wall MI, anoxic encephalopahty, VF; LCfx free of disease  and dominant, RCA patent, L main short and patent, LAD with 70-80% narrowing and focal 95% stenosis in distal 3rd  (Dr. FABIENE Pinion)   LEFT HEART CATH AND CORS/GRAFTS ANGIOGRAPHY N/A 01/08/2020   Procedure: LEFT HEART CATH AND CORS/GRAFTS ANGIOGRAPHY;  Surgeon: Anner Alm ORN, MD;  Location: [facility] INVASIVE CV LAB; EF 40 to 45%. Mildly elevated LVEDP. Proximal LAD 100% occlusion, mid-distal LAD fills via SVG that has collaterals to D1. Normal dominant native LCx and nondominant RCA. SVG-D1 ostial 100% CTO, SVG-LAD severe focal disease with 75% followed by 85% calcified stenosis--DES PCI   LEFT HEART CATH AND CORS/GRAFTS ANGIOGRAPHY  01/31/2003   LAD totally occluded in prox 3rd, patent Cfx, SVG to mid LAd patent, SVG to small DX1 patent, LIMA to LAD was atretic and essentially occluded in junction of prox & mid 3rd, R barciocephalic and subclavian were normal (Dr. FABIENE Pinion)   [facility] PERF WALL MOTION  10/2010; 06/2014   a) LexiScan  Cardiolite  - mild perfusion defect r/t infarct/scar with mild periifarct  ischemia in mid anterior & apical anterior region; EF 67%; abnormal, low risk study;; b) Small, moderate intensity Fixed perfusion defect - mid Anterior Wall c/w known Anterior MI. LOW RISK    OVARIAN CYST REMOVAL     TRANSTHORACIC ECHOCARDIOGRAM  02/2012; 06/2014   a) EF 50-55%, mild HK of anterospetal myocardium; mild MR;; b) EF 55-60%, mild HK of apical anterior wall.   Mild MR   TRANSTHORACIC ECHOCARDIOGRAM  01/02/2020   EF 45 to 50%. Apical inferior, apical septal and apex akinetic. GRII DD. Otherwise normal valves, normal RV. Normal atria. Normal pressures.    ALLERGIES: Allergies  Allergen Reactions   Iodinated Contrast Media Hives and Dermatitis   Linzess [Linaclotide] Other (See Comments)    Caused horrible stomach pain and gas   Atorvastatin  Other (See Comments)    Gas pain in abdomen with no relief   Other Diarrhea and Other (See Comments)    IBS- Special diet   Milk-Related Compounds Diarrhea and Other (See Comments)    Has IBS   Rosuvastatin  Calcium  Other (See Comments)    Worsening GI upset   Simvastatin Other (See Comments)    Myalgia & GI upset   Eluxadoline Nausea And Vomiting and Other (See Comments)    Viberzi - stomach pain   Iohexol  Hives, Rash and Other (See Comments)   Marinol [Dronabinol] Palpitations and Other (See Comments)    Made me feel high and almost passed out   Red Dye #40 (Allura Red) Hives and Rash    Red contrast dye     FAMILY HISTORY:  Family History  Problem Relation Age of Onset   Diabetes Mother    Breast cancer Mother 74   Lung cancer Father 43 - 34   Throat cancer Father 43 - 88   Prostate cancer Brother 59   Thyroid  disease Paternal Aunt    Cancer Paternal Aunt        unknown type   Thyroid  disease Maternal Grandmother    Heart failure Other    Diabetes Other    Stomach cancer Other     SOCIAL HISTORY: Social History   Socioeconomic History   Marital status: Married    Spouse name: Not on file   Number of children: 3    Years of education: Not on file   Highest education level: 12th grade  Occupational History    Employer: DISABLED  Tobacco Use   Smoking status: Former  Current packs/day: 0.00    Types: Cigarettes    Quit date: 01/04/2020    Years since quitting: 3.6   Smokeless tobacco: Never  Vaping Use   Vaping status: Never Used  Substance and Sexual Activity   Alcohol use: No    Alcohol/week: 0.0 standard drinks of alcohol   Drug use: Never   Sexual activity: Not on file  Other Topics Concern   Not on file  Social History Narrative   Married, mother of 3 children (2 living sons age 28-26 and 70-31; her daughter was the victim of a murder that took place sometime around the time of the patient's MI. She was under significant amount of stress as her daughter had gone missing. She was never found. Finally the suspect was charged and convicted in 2009. She had sessile he stop smoking until that time frame when it brought back memories and she is now back to smoking a half pack a day.She does not get routine exercise, but is active around the house and doing housecleaning chores.She does not drink alcohol.         Family history: mgm died at 35, several maternal aunts and uncles all died in their 46's as well; mother didn't have any heart issues, had dementia, DM, died in her 57's; father lung cancer - smoker died at 8; 1 brother w/o heart issues; 2 living children - one with seizure disorder, no cholesterol issues known       Diet: doesn't eat much; has problems with GI/bloating since bypass; maybe 1-1.5 meals per day; oatmeal in the monings; sandwiches/soups in small amounts in the day; IBS issues more constipation; linzess, simethicone , maalox       Exercise:  housework   Social Drivers of Corporate investment banker Strain: Low Risk  (08/24/2023)   Overall Financial Resource Strain (CARDIA)    Difficulty of Paying Living Expenses: Not hard at all  Food Insecurity: No Food Insecurity  (08/24/2023)   Hunger Vital Sign    Worried About Running Out of Food in the Last Year: Never true    Ran Out of Food in the Last Year: Never true  Transportation Needs: No Transportation Needs (08/24/2023)   PRAPARE - Administrator, Civil Service (Medical): No    Lack of Transportation (Non-Medical): No  Physical Activity: Sufficiently Active (08/24/2023)   Exercise Vital Sign    Days of Exercise per Week: 3 days    Minutes of Exercise per Session: 60 min  Stress: No Stress Concern Present (08/24/2023)   [employer] of Occupational Health - Occupational Stress Questionnaire    Feeling of Stress: Not at all  Social Connections: Socially Isolated (08/24/2023)   Social Connection and Isolation Panel    Frequency of Communication with Friends and Family: Once a week    Frequency of Social Gatherings with Friends and Family: Never    Attends Religious Services: Never    Database administrator or Organizations: No    Attends Engineer, structural: Never    Marital Status: Married    MEDICATIONS:  Current Outpatient Medications  Medication Sig Dispense Refill   acetaminophen  (TYLENOL ) 500 MG tablet Take 500 mg by mouth every 6 (six) hours as needed for mild pain.     albuterol  (VENTOLIN  HFA) 108 (90 Base) MCG/ACT inhaler Inhale 1-2 puffs into the lungs every 6 (six) hours as needed for wheezing or shortness of breath. 1 each 2   Alpha-D-Galactosidase (BEANO PO) Take  2 capsules by mouth See admin instructions. Take 2 capsules after lunch and dinner     Calcium  Carb-Cholecalciferol  (CALCIUM +D3 PO) Take 1 tablet by mouth at bedtime.     cetirizine  (ZYRTEC ) 10 MG tablet TAKE 1 TABLET DAILY 90 tablet 4   Evolocumab  (REPATHA  SURECLICK) 140 MG/ML SOAJ Inject 140 mg into the skin every 14 (fourteen) days. 6 mL 3   levothyroxine  (SYNTHROID ) 50 MCG tablet TAKE ONE AND ONE-HALF TABLETS DAILY BEFORE BREAKFAST 135 tablet 3   loperamide  (IMODIUM ) 2 MG capsule Take 1 capsule (2 mg  total) by mouth daily. 30 capsule 0   methocarbamol  (ROBAXIN ) 500 MG tablet Take 1 tablet (500 mg total) by mouth every 8 (eight) hours as needed for muscle spasms. 10 tablet 0   metoprolol  succinate (TOPROL -XL) 25 MG 24 hr tablet TAKE 1 TABLET TWICE A DAY (DISCONTINUE 50 MG) 180 tablet 3   nitroGLYCERIN  (NITROSTAT ) 0.4 MG SL tablet Place 1 tablet (0.4 mg total) under the tongue every 5 (five) minutes x 3 doses as needed for chest pain. 25 tablet 4   Peppermint Oil (IBGARD PO) Take 1-2 tablets by mouth See admin instructions. Take 2 after lunch and 1 tablet after supper, may take a 1 tablet dose as needed for IBS symptoms     polyethylene glycol powder (GLYCOLAX /MIRALAX ) 17 GM/SCOOP powder Take 12.75 g by mouth in the morning.     potassium chloride  (KLOR-CON ) 10 MEQ tablet Take 10 mEq by mouth daily.     Probiotic Product (ALIGN) 4 MG CAPS Take 4 mg by mouth in the morning.     rivaroxaban  (XARELTO ) 20 MG TABS tablet TAKE 1 TABLET DAILY WITH SUPPER 90 tablet 1   Simethicone  125 MG TABS Take 125-250 mg by mouth See admin instructions. Take 250 mg by mouth after lunch and 125 mg after each additional meal     No current facility-administered medications for this visit.    PHYSICAL EXAM: Vitals:   09/14/23 1501  BP: 118/80  Pulse: 73  Resp: 20  SpO2: 98%  Weight: 157 lb 6.4 oz (71.4 kg)  Height: 5' 2 (1.575 m)   Body mass index is 28.79 kg/m.  Wt Readings from Last 3 Encounters:  09/14/23 157 lb 6.4 oz (71.4 kg)  08/24/23 150 lb (68 kg)  06/28/23 148 lb 12.8 oz (67.5 kg)    General: Well developed, well nourished female in no apparent distress.  HEENT: AT/[place], no external lesions. Hearing intact to the spoken word Eyes: EOMI. No stare, proptosis or lid lag. Conjunctiva clear and no icterus. Neck: Trachea midline, neck supple without appreciable thyromegaly or lymphadenopathy and no palpable thyroid  nodules Lungs: Clear to auscultation, no wheeze. Respirations not labored Heart:  S1S2, Regular in rate and rhythm. No loud murmurs Abdomen: Soft, non tender, non distended Neurologic: Alert, oriented, normal speech, deep tendon biceps reflexes normal,  no gross focal neurological deficit Extremities: No pedal pitting edema, no tremors of outstretched hands Skin: Warm, color good.  Psychiatric: Does not appear depressed or anxious  PERTINENT HISTORIC LABORATORY AND IMAGING STUDIES:  All pertinent laboratory results were reviewed. Please see HPI also for further details.   TSH  Date Value Ref Range Status  05/31/2023 1.078 0.350 - 4.500 uIU/mL Final    Comment:    Performed by a 3rd Generation assay with a functional sensitivity of <=0.01 uIU/mL. Performed at [facility], [address], [place], [place] [address]   06/03/2022 1.32 0.35 - 5.50 uIU/mL Final  01/22/2022 2.988 0.350 - 4.500 uIU/mL Final    Comment:    Performed by a 3rd Generation assay with a functional sensitivity of <=0.01 uIU/mL. Performed at [facility], [address], [place], [place] [address]      ASSESSMENT / PLAN  1. Postablative hypothyroidism   2. Toxic multinodular goiter    Patient has longstanding history of postablative hypothyroidism, treated with RAI therapy in 1999 for toxic multinodular goiter.  She has been on a stable dose of levothyroxine /Synthroid  75 mcg daily since 2016.  She is clinically euthyroid today.  She is actually taking 50 mcg 1-1/2 tablets daily.  There was concern of dye allergy based on prior endocrinology note, patient does not recall having allergic reaction, in the past and has been taking levothyroxine  this way for a long time.  We discussed the medical need for compliance with levothyroxine  therapy, that it is a hormone necessary for life, and that serious consequences may result from noncompliance. Discussed the proper method of levothyroxine  administration: take on an empty stomach in the morning, with water, waiting thirty to  sixty minutes before taking any other beverages or food. Also reviewed the need to take calcium  or iron supplements or multivitamin (that may contain iron or calcium ) at least 4 hours after levothyroxine  administration.  Plan: - Will check thyroid  function test and adjust dose of levothyroxine  as needed. - Annual endocrinology follow-up.   Diagnoses and all orders for this visit:  Postablative hypothyroidism -     T4, free -     TSH  Toxic multinodular goiter    DISPOSITION Follow up in clinic in 12 months suggested.  Labs today and prior to follow-up visit.  All questions answered and patient verbalized understanding of the plan.  [place] Felix Meras, MD [facility] Endocrinology [facility] [address] [place], [address] [place], [place] [address] Phone # [phone]  At least part of this note was generated using voice recognition software. Inadvertent word errors may have occurred, which were not recognized during the proofreading process.

## 2023-09-15 ENCOUNTER — Ambulatory Visit: Payer: Self-pay | Admitting: Endocrinology

## 2023-10-30 ENCOUNTER — Ambulatory Visit: Admitting: Cardiology

## 2023-11-03 ENCOUNTER — Ambulatory Visit: Attending: Cardiology | Admitting: Cardiology

## 2023-11-03 VITALS — BP 116/68 | HR 125 | Ht 62.0 in | Wt 162.0 lb

## 2023-11-03 DIAGNOSIS — T466X5A Adverse effect of antihyperlipidemic and antiarteriosclerotic drugs, initial encounter: Secondary | ICD-10-CM | POA: Diagnosis not present

## 2023-11-03 DIAGNOSIS — I4819 Other persistent atrial fibrillation: Secondary | ICD-10-CM | POA: Diagnosis not present

## 2023-11-03 DIAGNOSIS — D6869 Other thrombophilia: Secondary | ICD-10-CM | POA: Insufficient documentation

## 2023-11-03 DIAGNOSIS — I25118 Atherosclerotic heart disease of native coronary artery with other forms of angina pectoris: Secondary | ICD-10-CM | POA: Diagnosis not present

## 2023-11-03 DIAGNOSIS — I4891 Unspecified atrial fibrillation: Secondary | ICD-10-CM | POA: Insufficient documentation

## 2023-11-03 DIAGNOSIS — E785 Hyperlipidemia, unspecified: Secondary | ICD-10-CM | POA: Diagnosis not present

## 2023-11-03 DIAGNOSIS — I480 Paroxysmal atrial fibrillation: Secondary | ICD-10-CM | POA: Insufficient documentation

## 2023-11-03 DIAGNOSIS — M791 Myalgia, unspecified site: Secondary | ICD-10-CM | POA: Insufficient documentation

## 2023-11-03 DIAGNOSIS — I25710 Atherosclerosis of autologous vein coronary artery bypass graft(s) with unstable angina pectoris: Secondary | ICD-10-CM

## 2023-11-03 MED ORDER — RIVAROXABAN 20 MG PO TABS
20.0000 mg | ORAL_TABLET | Freq: Every day | ORAL | 3 refills | Status: AC
Start: 2023-11-03 — End: ?

## 2023-11-03 MED ORDER — REPATHA SURECLICK 140 MG/ML ~~LOC~~ SOAJ: 140.0000 mg | SUBCUTANEOUS | 3 refills | Status: AC

## 2023-11-03 MED ORDER — NITROGLYCERIN 0.4 MG SL SUBL: 0.4000 mg | SUBLINGUAL_TABLET | SUBLINGUAL | 3 refills | Status: AC | PRN

## 2023-11-03 MED ORDER — METOPROLOL SUCCINATE ER 25 MG PO TB24: 25.0000 mg | ORAL_TABLET | Freq: Two times a day (BID) | ORAL | 3 refills | Status: AC

## 2023-11-03 NOTE — Patient Instructions (Signed)
 Medication Instructions:     Take an extra Metoprolol   tonight once you get home.  If heart rate is still elevated tomorrow please take another extra metoprolol .  If you are feeling bad over the weekend or heart rate  does not go down ER or contact [facility] provider line fro better instructions.  *If you need a refill on your cardiac medications before your next appointment, please call your pharmacy*   Lab Work: CBC BMP If you have labs (blood work) drawn today and your tests are completely normal, you will receive your results only by: MyChart Message (if you have MyChart) OR A paper copy in the mail If you have any lab test that is abnormal or we need to change your treatment, we will call you to review the results.   Testing/Procedures: Your physician has recommended that you have a Cardioversion (DCCV). Electrical Cardioversion uses a jolt of electricity to your heart either through paddles or wired patches attached to your chest. This is a controlled, usually prescheduled, procedure. Defibrillation is done under light anesthesia in the hospital, and you usually go home the day of the procedure. This is done to get your heart back into a normal rhythm. You are not awake for the procedure. Please see the instruction sheet given to you today.    Follow-Up: At [facility], you and your health needs are our priority.  As part of our continuing mission to provide you with exceptional heart care, we have created designated Provider Care Teams.  These Care Teams include your primary Cardiologist (physician) and Advanced Practice Providers (APPs -  Physician Assistants and Nurse Practitioners) who all work together to provide you with the care you need, when you need it.     Your next appointment:    3 to 4 week(s)  The format for your next appointment:   In Person  Provider:   You will follow up in the Atrial Fibrillation Clinic located at [facility]. Your provider  will be: Dominique R. Fenton, PA-C or Dominique Heinrich, PA-C  Your physician wants you to follow-up in: EP proivders  Dominique Norton, MD, Dominique Primus, MD, Dominique Furbish, MD, or Dominique Kitty, MD   Other Instructions       Dear Dominique Barber  You are scheduled for a Cardioversion on Tuesday, November 4 with Dominique Barber.  Please arrive at the [facility] (Main Entrance A) at [facility]: [address] [place], [place] [address] at 7:00 AM (This time is 1 hour(s) before your procedure to ensure your preparation).   Free valet parking service is available. You will check in at ADMITTING.   *Please Note: You will receive a call the day before your procedure to confirm the appointment time. That time may have changed from the original time based on the schedule for that day.*    DIET:  Nothing to eat or drink after midnight except a sip of water with medications (see medication instructions below)  MEDICATION INSTRUCTIONS: !!IF ANY NEW MEDICATIONS ARE STARTED AFTER TODAY, PLEASE NOTIFY YOUR PROVIDER AS SOON AS POSSIBLE!!  FYI: Medications such as Semaglutide (Ozempic, Wegovy), Tirzepatide (Mounjaro, Zepbound), Dulaglutide (Trulicity), etc (GLP1 agonists) AND Canagliflozin (Invokana), Dapagliflozin (Farxiga), Empagliflozin (Jardiance), Ertugliflozin (Steglatro), Bexagliflozin [employer]) or any combination with one of these drugs such as Invokamet (Canagliflozin/Metformin), Synjardy (Empagliflozin/Metformin), etc (SGLT2 inhibitors) must be held around the time of a procedure. This is not a comprehensive list of all of these drugs. Please review all of  your medications and talk to your provider if you take any one of these. If you are not sure, ask your provider.     Continue taking your anticoagulant (blood thinner): Rivaroxaban  (Xarelto ).  You will need to continue this after your procedure until you are told by your provider that it is safe to stop.    LABS:   Come to the lab  at the [facility] ([address] [place], 1st Floor) between the hours of 8:00 am and 4:30 pm. You do NOT have to be fasting.  FYI:  For your safety, and to allow us  to monitor your vital signs accurately during the surgery/procedure we request: If you have artificial nails, gel coating, SNS etc, please have those removed prior to your surgery/procedure. Not having the nail coverings /polish removed may result in cancellation or delay of your surgery/procedure.  Your support person will be asked to wait in the waiting room during your procedure.  It is OK to have someone drop you off and come back when you are ready to be discharged.  You cannot drive after the procedure and will need someone to drive you home.  Bring your insurance cards.  *Special Note: Every effort is made to have your procedure done on time. Occasionally there are emergencies that occur at the hospital that may cause delays. Please be patient if a delay does occur.

## 2023-11-03 NOTE — Progress Notes (Signed)
 Cardiology Office Note:  .   Date:  11/04/2023  ID:  Dominique Barber, DOB 1955-01-17, MRN [id number] PCP: Tanda Bleacher, MD  [facility] Providers Cardiologist:  Alm Clay, MD     Chief Complaint  Patient presents with   Follow-up    2-month follow-up.  Feeling a bit on edge.  Maybe some chest tightness and shortness of breath.  Husband said that she went into A-fib this morning   Atrial Fibrillation    Tachycardic today   Coronary Artery Disease    No classic angina symptoms.    Patient Profile: .     Dominique Barber is a mildly obese 68 y.o. female  with a PMH reviewed below who presents here for 59-month follow-up at the request of Tanda Bleacher, MD. Pertinent Past Medical History: CAD: STEMI 1998 w/ VR Arrest & ~ anoxic brain injury -> status epilepticus -> POBA-LAD --> 1 month later returned with [place]  --> 95% re-stenosis --> BMS-LAD (Dr. Maye) 7/'98 Recurrent Angina -> severe ISR --> CABG x 2 (LIMA-LAD, SVG-D1) => emergent re-operation post-OP --> LIMA occlusion --> SVG-LAD. Jan 2005: Cath -> patent grafts & normal Dom LCx, non-dome RCA. Small D1). Jan 02, 2020: Admitted with Afib-RVR & Demand Ischemia (+ Trop & lat TWI) - planned OP Myoview Jan 04 2020 - ER with CP (left after 12 hr & not seen) --> EMS came out on Jan 2, EKG no longer had TWI.  Jan 06, 2020 -> returned to ER with recurrent ACS Sx --> Cath Jan 08, 2020 Cath-PCI SVG-LAD, SVG-Diag CTO. Stable native Dom LCx & non-dome RCA PAF -Most recent episode Dec 2021 -> converted with IV Diltiazem ; on Xarelto  CRFs: HTN, HLD;  COPD, GERD,  => statin intolerant- on Repatha  ** IBS has been flaring up since Dec 2021 (mostly notes epigastric pain, bloating, early satiety & nausea Recently diagnosed colon cancer status post partial colectomy     Dominique Barber was last seen on April 11, 2023 as a follow-up from her visit in February with Dr. Jordan where she was found to be in A-fib RVR.  She also then was  found to have anemia with hemoglobin 7.5.  Colonoscopy revealed colon cancer and she was referred for partial colectomy.  She was noting some exertional dyspnea but no real chest pain.  No real sensation of A-fib.  She had been switched from and IVUS to Xarelto .  I recommended that we use amiodarone  prophylactically to keep her out of A-fib during her surgery.  Subjective  Discussed the use of AI scribe software for clinical note transcription with the patient, who gave verbal consent to proceed.  History of Present Illness Dominique Barber is a 69 year old female with atrial fibrillation who presents with symptoms of atrial fibrillation.  She experiences episodes of increased heart rate and shortness of breath, feeling 'edgy' and tightness in her chest, particularly last night, lasting about an hour. These symptoms often occur when her atrial fibrillation is active.  She is currently taking metoprolol  25 mg twice daily and Xarelto . She missed a dose of her daytime medication recently, which she believes may have contributed to her symptoms.  No current chest pain either at rest or exertion except for when she is in A-fib. No recent strokes and is not diabetic.  Cardiovascular ROS: positive for - dyspnea on exertion, irregular heartbeat, palpitations, shortness of breath, and more of a sense of unease and just not feeling well.  On edge.  She describes as a tightness in her chest not really her anginal type chest pain negative for - edema, orthopnea, paroxysmal nocturnal dyspnea, or syncope or near syncope  ROS:  Review of Systems - Negative except her usual abdominal bloating issues.  Has not noted any melena, hematochezia hematuria, or epistaxis.  .    Objective   Current Meds  Medication Sig   acetaminophen  (TYLENOL ) 500 MG tablet Take 500 mg by mouth every 6 (six) hours as needed for mild pain.   albuterol  (VENTOLIN  HFA) 108 (90 Base) MCG/ACT inhaler Inhale 1-2 puffs into the lungs  every 6 (six) hours as needed for wheezing or shortness of breath.   Alpha-D-Galactosidase (BEANO PO) Take 2 capsules by mouth See admin instructions. Take 2 capsules after lunch and dinner   Calcium  Carb-Cholecalciferol  (CALCIUM +D3 PO) Take 1 tablet by mouth at bedtime.   cetirizine  (ZYRTEC ) 10 MG tablet TAKE 1 TABLET DAILY   levothyroxine  (SYNTHROID ) 50 MCG tablet TAKE ONE AND ONE-HALF TABLETS DAILY BEFORE BREAKFAST   loperamide  (IMODIUM ) 2 MG capsule Take 1 capsule (2 mg total) by mouth daily.   methocarbamol  (ROBAXIN ) 500 MG tablet Take 1 tablet (500 mg total) by mouth every 8 (eight) hours as needed for muscle spasms.   Peppermint Oil (IBGARD PO) Take 1-2 tablets by mouth See admin instructions. Take 2 after lunch and 1 tablet after supper, may take a 1 tablet dose as needed for IBS symptoms   polyethylene glycol powder (GLYCOLAX /MIRALAX ) 17 GM/SCOOP powder Take 12.75 g by mouth in the morning.   potassium chloride  SA (KLOR-CON  M) 20 MEQ tablet Take 20 mEq by mouth daily.   Probiotic Product (ALIGN) 4 MG CAPS Take 4 mg by mouth in the morning.   Simethicone  125 MG TABS Take 125-250 mg by mouth See admin instructions. Take 250 mg by mouth after lunch and 125 mg after each additional meal   []  Evolocumab  (REPATHA  SURECLICK) 140 MG/ML SOAJ Inject 140 mg into the skin every 14 (fourteen) days.   []  metoprolol  succinate (TOPROL -XL) 25 MG 24 hr tablet TAKE 1 TABLET TWICE A DAY (DISCONTINUE 50 MG)   []  nitroGLYCERIN  (NITROSTAT ) 0.4 MG SL tablet Place 1 tablet (0.4 mg total) under the tongue every 5 (five) minutes x 3 doses as needed for chest pain.   []  rivaroxaban  (XARELTO ) 20 MG TABS tablet TAKE 1 TABLET DAILY WITH SUPPER     Studies Reviewed: Dominique Barber   EKG Interpretation Date/Time:  Friday November 03 2023 16:50:51 EDT Ventricular Rate:  124 PR Interval:    QRS Duration:  78 QT Interval:  318 QTC Calculation: 456 R Axis:   79  Text Interpretation: Atrial fibrillation with rapid ventricular  response Low voltage QRS Cannot rule out Anterior infarct , age undetermined When compared with ECG of 28-May-2023 10:24, afib rvr present QRS axis Shifted left Minimal criteria for Anterior infarct are now Present Nonspecific T wave abnormality no longer evident in Inferior leads Confirmed by Anner Lenis ([id number]) on 11/04/2023 9:42:22 AM   Lab Results  Component Value Date   CHOL 129 06/28/2023   HDL 51 06/28/2023   LDLCALC 52 06/28/2023   TRIG 153 (H) 06/28/2023   CHOLHDL 2.5 06/28/2023   Lab Results  Component Value Date   HGBA1C 6.3 (H) 05/27/2023    Echo 03/31/23: Normal LV size and function with EF 55 to 60%.  No RWMA.  Elevated LAP with abnormal relaxation.  Normal RV size and function but unable to assess PAP.  Normal  valves with normal  Risk Assessment/Calculations:    CHA2DS2-VASc Score = 4   This indicates a 4.8% annual risk of stroke. The patient's score is based upon: CHF History: 0 HTN History: 1 Diabetes History: 0 Stroke History: 0 Vascular Disease History: 1 Age Score: 1 Gender Score: 1              Physical Exam:   VS:  BP 116/68   Pulse (!) 125   Ht 5' 2 (1.575 m)   Wt 162 lb (73.5 kg)   SpO2 96%   BMI 29.63 kg/m    Wt Readings from Last 3 Encounters:  11/03/23 162 lb (73.5 kg)  09/14/23 157 lb 6.4 oz (71.4 kg)  08/24/23 150 lb (68 kg)    Physical Exam VITALS: P- 125, BP- 160/68 MUSCULOSKELETAL: No clubbing, cyanosis, or edema. Normal pedal pulses.   GEN: Well nourished, well developed in no acute distress; mild truncal obesity NECK: No JVD; No carotid bruits CARDIAC: Tachycardic with irregular irregular rhythm no M/R/G. RESPIRATORY:  Clear to auscultation without rales, wheezing or rhonchi ; nonlabored, good air movement. ABDOMEN: Soft, non-tender, non-distended EXTREMITIES:  No edema; No deformity      ASSESSMENT AND PLAN: .    Problem List Items Addressed This Visit       Cardiology Problems   Atrial fibrillation with rapid  ventricular response (HCC) - Primary   Recurrent symptomatic episodes with heart rate at 125 bpm, shortness of breath, and fatigue. Recent episode possibly due to missed metoprolol  dose. Discussed atrial fibrillation ablation and Watchman device to reduce medication reliance and prevent stroke. Cardioversion considered to restore normal rhythm, explained as a safe procedure with low risk.  - Take an extra dose of metoprolol  tonight and tomorrow morning if still symptomatic. - If symptoms worsen, go to the emergency room for potential cardioversion. - Set up for cardioversion early next week if still in atrial fibrillation. - Refer to electrophysiology for potential atrial fibrillation ablation and Watchman device consideration.      Relevant Medications   rivaroxaban  (XARELTO ) 20 MG TABS tablet   nitroGLYCERIN  (NITROSTAT ) 0.4 MG SL tablet   metoprolol  succinate (TOPROL -XL) 25 MG 24 hr tablet   Evolocumab  (REPATHA  SURECLICK) 140 MG/ML SOAJ   Other Relevant Orders   EKG 12-Lead (Completed)   Ambulatory referral to Cardiac Electrophysiology   Amb Referral to AFIB Clinic   Basic metabolic panel with GFR   CBC   Coronary artery disease involving native coronary artery of native heart (Chronic)   Elevated heart rate without angina symptoms during recent episode. Blood pressure controlled with current medication regimen. - Not on Thienopyridine because of DOAC. -Continue Repatha  for lipids along with Toprol -XL 25 mg twice daily      Relevant Medications   rivaroxaban  (XARELTO ) 20 MG TABS tablet   nitroGLYCERIN  (NITROSTAT ) 0.4 MG SL tablet   metoprolol  succinate (TOPROL -XL) 25 MG 24 hr tablet   Evolocumab  (REPATHA  SURECLICK) 140 MG/ML SOAJ   Other Relevant Orders   EKG 12-Lead (Completed)   Ambulatory referral to Cardiac Electrophysiology   Amb Referral to AFIB Clinic   Basic metabolic panel with GFR   CBC   Hypercoagulable state due to paroxysmal atrial fibrillation (HCC) (Chronic)    Dominique Barber is at least 4.  Continues on Xarelto .  Monitor for GI bleeding issues.      Relevant Medications   rivaroxaban  (XARELTO ) 20 MG TABS tablet   nitroGLYCERIN  (NITROSTAT ) 0.4 MG SL tablet   metoprolol  succinate (  TOPROL -XL) 25 MG 24 hr tablet   Evolocumab  (REPATHA  SURECLICK) 140 MG/ML SOAJ   Other Relevant Orders   EKG 12-Lead (Completed)   Ambulatory referral to Cardiac Electrophysiology   Amb Referral to AFIB Clinic   Basic metabolic panel with GFR   CBC   Hyperlipidemia with target low density lipoprotein (LDL) cholesterol less than 55 mg/dL (Chronic)   Cholesterol levels last checked in June. Current medication regimen includes Repatha  for cholesterol management. - Refilled Repatha  140 mg every 2 week.      Relevant Medications   rivaroxaban  (XARELTO ) 20 MG TABS tablet   nitroGLYCERIN  (NITROSTAT ) 0.4 MG SL tablet   metoprolol  succinate (TOPROL -XL) 25 MG 24 hr tablet   Evolocumab  (REPATHA  SURECLICK) 140 MG/ML SOAJ   Other Relevant Orders   EKG 12-Lead (Completed)   Basic metabolic panel with GFR   CBC   Persistent atrial fibrillation (HCC) (Chronic)   Back in A-fib now.  Has had prolonged episodes in the past, but recently she says her episodes are short. She is currently in RVR going 120s.  She has not missed any doses of Xarelto  Discussed electrophysiologist consultation for further management, including pulmonary vein isolation and Watchman device, potentially eliminating the need for Xarelto .  - Set up for cardioversion early next week if still in atrial fibrillation. - Refer to electrophysiology for potential atrial fibrillation ablation and Watchman device consideration.      Relevant Medications   rivaroxaban  (XARELTO ) 20 MG TABS tablet   nitroGLYCERIN  (NITROSTAT ) 0.4 MG SL tablet   metoprolol  succinate (TOPROL -XL) 25 MG 24 hr tablet   Evolocumab  (REPATHA  SURECLICK) 140 MG/ML SOAJ   Other Relevant Orders   EKG 12-Lead (Completed)   Ambulatory referral to  Cardiac Electrophysiology   Basic metabolic panel with GFR   CBC     Other   Myalgia due to statin (Chronic)   Intolerant of almost every statin.  Doing well now on Repatha .      Relevant Orders   EKG 12-Lead (Completed)   Basic metabolic panel with GFR   CBC        Informed Consent   Shared Decision Making/Informed Consent The risks (stroke, cardiac arrhythmias rarely resulting in the need for a temporary or permanent pacemaker, skin irritation or burns and complications associated with conscious sedation including aspiration, arrhythmia, respiratory failure and death), benefits (restoration of normal sinus rhythm) and alternatives of a direct current cardioversion were explained in detail to Dominique Barber and she agrees to proceed.        Follow-Up: Return in about 3 weeks (around 11/24/2023) for [name] clinic, 3-4 month follow-up.  I spent 56 minutes in the care of Dominique Barber today including reviewing labs (1 minute), reviewing outside labs from PCP (included in 1), reviewing studies (3 minutes reviewing recent echo and cath films), face to face time discussing treatment options (37 minutes), reviewing records from recent notes (3 minutes), 12 minutes, and documenting in the encounter.      Signed, Alm MICAEL Clay, MD, MS Alm Clay, M.D., M.S. Interventional Cardiologist  [facility] Pager # [phone]

## 2023-11-04 ENCOUNTER — Encounter: Payer: Self-pay | Admitting: Cardiology

## 2023-11-04 NOTE — Assessment & Plan Note (Signed)
 Dominique Barber is at least 4.  Continues on Xarelto .  Monitor for GI bleeding issues.

## 2023-11-04 NOTE — Assessment & Plan Note (Signed)
 Intolerant of almost every statin.  Doing well now on Repatha .

## 2023-11-04 NOTE — Assessment & Plan Note (Signed)
 Cholesterol levels last checked in June. Current medication regimen includes Repatha  for cholesterol management. - Refilled Repatha  140 mg every 2 week.

## 2023-11-04 NOTE — Assessment & Plan Note (Signed)
 Recurrent symptomatic episodes with heart rate at 125 bpm, shortness of breath, and fatigue. Recent episode possibly due to missed metoprolol  dose. Discussed atrial fibrillation ablation and Watchman device to reduce medication reliance and prevent stroke. Cardioversion considered to restore normal rhythm, explained as a safe procedure with low risk.  - Take an extra dose of metoprolol  tonight and tomorrow morning if still symptomatic. - If symptoms worsen, go to the emergency room for potential cardioversion. - Set up for cardioversion early next week if still in atrial fibrillation. - Refer to electrophysiology for potential atrial fibrillation ablation and Watchman device consideration.

## 2023-11-04 NOTE — Assessment & Plan Note (Signed)
 Back in A-fib now.  Has had prolonged episodes in the past, but recently she says her episodes are short. She is currently in RVR going 120s.  She has not missed any doses of Xarelto  Discussed electrophysiologist consultation for further management, including pulmonary vein isolation and Watchman device, potentially eliminating the need for Xarelto .  - Set up for cardioversion early next week if still in atrial fibrillation. - Refer to electrophysiology for potential atrial fibrillation ablation and Watchman device consideration.

## 2023-11-04 NOTE — Assessment & Plan Note (Signed)
 Elevated heart rate without angina symptoms during recent episode. Blood pressure controlled with current medication regimen. - Not on Thienopyridine because of DOAC. -Continue Repatha  for lipids along with Toprol -XL 25 mg twice daily

## 2023-11-06 ENCOUNTER — Ambulatory Visit: Attending: Cardiovascular Disease | Admitting: *Deleted

## 2023-11-06 ENCOUNTER — Telehealth: Payer: Self-pay | Admitting: Cardiology

## 2023-11-06 ENCOUNTER — Other Ambulatory Visit: Payer: Self-pay | Admitting: *Deleted

## 2023-11-06 VITALS — BP 122/80 | HR 128 | Ht 62.0 in | Wt 164.2 lb

## 2023-11-06 DIAGNOSIS — D6869 Other thrombophilia: Secondary | ICD-10-CM | POA: Diagnosis not present

## 2023-11-06 DIAGNOSIS — I4819 Other persistent atrial fibrillation: Secondary | ICD-10-CM | POA: Insufficient documentation

## 2023-11-06 DIAGNOSIS — I4891 Unspecified atrial fibrillation: Secondary | ICD-10-CM | POA: Diagnosis not present

## 2023-11-06 DIAGNOSIS — M791 Myalgia, unspecified site: Secondary | ICD-10-CM | POA: Diagnosis not present

## 2023-11-06 DIAGNOSIS — E785 Hyperlipidemia, unspecified: Secondary | ICD-10-CM | POA: Diagnosis not present

## 2023-11-06 DIAGNOSIS — I25118 Atherosclerotic heart disease of native coronary artery with other forms of angina pectoris: Secondary | ICD-10-CM | POA: Diagnosis not present

## 2023-11-06 DIAGNOSIS — I480 Paroxysmal atrial fibrillation: Secondary | ICD-10-CM | POA: Diagnosis not present

## 2023-11-06 DIAGNOSIS — I25710 Atherosclerosis of autologous vein coronary artery bypass graft(s) with unstable angina pectoris: Secondary | ICD-10-CM | POA: Diagnosis not present

## 2023-11-06 DIAGNOSIS — T466X5A Adverse effect of antihyperlipidemic and antiarteriosclerotic drugs, initial encounter: Secondary | ICD-10-CM | POA: Diagnosis not present

## 2023-11-06 LAB — CBC

## 2023-11-06 MED ORDER — AMIODARONE HCL 200 MG PO TABS: ORAL_TABLET | ORAL | Status: DC

## 2023-11-06 NOTE — Telephone Encounter (Signed)
 Pt's spouse Ryan stated pt is scheduled to have a cardioversion done tomorrow morning at 8am but she's no longer in Afib. He'd like a callback to discuss further to get assistance on what he should do next or what the plan would look like since this is the case now. Please advise

## 2023-11-06 NOTE — Telephone Encounter (Signed)
 Spoke with Dr. Anner and his nurse. Pt needs an EKG. Called pt's husband to let him know we need an updated EKG, without a fib. They will come in for a nurse visit today at 2pm.

## 2023-11-06 NOTE — Progress Notes (Signed)
   Virtual Visit via Telephone Note   Because of Dominique Barber's co-morbid illnesses, she is at least at moderate risk for complications without adequate follow up.  This format is felt to be most appropriate for this Barber at this time.  Dominique Barber did not have access to video technology/had technical difficulties with video requiring transitioning to audio format only (telephone).  All issues noted in this document were discussed and addressed.  No physical exam could be performed with this format.  Please refer to Dominique Barber's chart for her consent to telehealth for [facility].    Virtual Nurse Visit   Date of Encounter: 11/06/2023 ID: [name], DOB 12-Oct-1955, MRN [id number]  PCP:  Tanda Bleacher, MD   [facility] Providers Cardiologist:  Alm Clay, MD     Dominique Barber called in saying that she felt like she may be back in sinus rhythm.  We requested that she came in for EKG to be performed to confirm if she is is not in A-fib. On presentation today she was feeling well but Dominique EKG did show A-fib RVR. This is a combination telephone and in person visit with nursing staff.  Visit Details   VS:  BP 122/80 (BP Location: Right Arm, Barber Position: Sitting, Cuff Size: Normal)   Pulse (!) 128   Ht 5' 2 (1.575 m)   Wt 164 lb 3.2 oz (74.5 kg)   BMI 30.03 kg/m  , BMI Body mass index is 30.03 kg/m.  Wt Readings from Last 3 Encounters:  11/06/23 164 lb 3.2 oz (74.5 kg)  11/03/23 162 lb (73.5 kg)  09/14/23 157 lb 6.4 oz (71.4 kg)     Reason for visit:  EKG . Barber called in earlier, stating  she was not in Afib. appointment  Performed today:  vitals completed , EKG done , provider consulted Dr Alm Clay. Per Clay , Barber is to take 400 mg ( 2 tablets) Amiodarone  when she returns home , and 400 mg tonight at bedtime. This after Barber husband stated she had  5 tablets left - of Amiodarone .  RN  answered Barber's question she and  husband voiced understanding  Changes (medications, testing, etc.) : EKG  Length of Visit: 45 minutes  EKG Interpretation Date/Time:  Monday November 06 2023 14:22:47 EST Ventricular Rate:  122 PR Interval:    QRS Duration:  74 QT Interval:  300 QTC Calculation: 427 R Axis:   65  Text Interpretation: Atrial fibrillation with rapid ventricular response Low voltage QRS Cannot rule out Anterior infarct (cited on or before 08-Jan-2020) When compared with ECG of 03-Nov-2023 16:50, No significant change was found Confirmed by Clay Alm ([id number]) on 11/06/2023 3:53:51 PM   Medications Adjustments/Labs and Tests Ordered: Orders Placed This Encounter  Procedures   EKG 12-Lead   EKG 12-Lead   Meds ordered this encounter  Medications   amiodarone  (PACERONE ) 200 MG tablet    Sig: Take 2 tablets now and 2 tablets at bedtime  one time dosing    Signed, Gladis Reena GAILS, RN  11/06/2023 3:28 PM   EKG read and reviewed.  Remains in A-fib RVR.  Would like to have her cardioverted.  States she does have amiodarone , she will take 400 mg twice daily today in preparation for upcoming cardioversion tomorrow 11/07/2023.  Signed,  Alm Clay, MD  11/06/2023 3:53 PM

## 2023-11-06 NOTE — Patient Instructions (Addendum)
 Medication Instructions:   Per Dr Anner ,take 400 mg Amiodarone   when you get home  and 400 mg  at bedtime.   Continue all other medicaitons *If you need a refill on your cardiac medications before your next appointment, please call your pharmacy*   Lab Work:  not needed  If you have labs (blood work) drawn today and your tests are completely normal, you will receive your results only by: MyChart Message (if you have MyChart) OR A paper copy in the mail If you have any lab test that is abnormal or we need to change your treatment, we will call you to review the results.   Testing/Procedures:  Proceed with schedule cardioversion tomorrow   Follow-Up: At [facility], you and your health needs are our priority.  As part of our continuing mission to provide you with exceptional heart care, we have created designated Provider Care Teams.  These Care Teams include your primary Cardiologist (physician) and Advanced Practice Providers (APPs -  Physician Assistants and Nurse Practitioners) who all work together to provide you with the care you need, when you need it.     Your next appointment:   Keep with Afib clinic  The format for your next appointment:   In Person  Provider:   Alm Anner, MD

## 2023-11-06 NOTE — Progress Notes (Signed)
 Spoke to patient and instructed them to come at 0700  and to be NPO after 0000.     Confirmed that patient will have a ride home and someone to stay with them for 24 hours after the procedure.   Medications reviewed.  Confirmed blood thinner.  Confirmed no breaks in taking blood thinner for 3+ weeks prior to procedure.

## 2023-11-06 NOTE — Telephone Encounter (Signed)
 Per DPR --  husband  aware of appointment at 2 pm  for EKG---today

## 2023-11-06 NOTE — H&P (View-Only) (Signed)
   Virtual Visit via Telephone Note   Because of Dominique Barber's co-morbid illnesses, she is at least at moderate risk for complications without adequate follow up.  This format is felt to be most appropriate for this patient at this time.  The patient did not have access to video technology/had technical difficulties with video requiring transitioning to audio format only (telephone).  All issues noted in this document were discussed and addressed.  No physical exam could be performed with this format.  Please refer to the patient's chart for her consent to telehealth for [facility].    Virtual Nurse Visit   Date of Encounter: 11/06/2023 ID: [name], DOB 12-Oct-1955, MRN [id number]  PCP:  Tanda Bleacher, MD   [facility] Providers Cardiologist:  Alm Clay, MD     The patient called in saying that she felt like she may be back in sinus rhythm.  We requested that she came in for EKG to be performed to confirm if she is is not in A-fib. On presentation today she was feeling well but the EKG did show A-fib RVR. This is a combination telephone and in person visit with nursing staff.  Visit Details   VS:  BP 122/80 (BP Location: Right Arm, Patient Position: Sitting, Cuff Size: Normal)   Pulse (!) 128   Ht 5' 2 (1.575 m)   Wt 164 lb 3.2 oz (74.5 kg)   BMI 30.03 kg/m  , BMI Body mass index is 30.03 kg/m.  Wt Readings from Last 3 Encounters:  11/06/23 164 lb 3.2 oz (74.5 kg)  11/03/23 162 lb (73.5 kg)  09/14/23 157 lb 6.4 oz (71.4 kg)     Reason for visit:  EKG . Patient called in earlier, stating  she was not in Afib. appointment  Performed today:  vitals completed , EKG done , provider consulted Dr Alm Clay. Per Clay , patient is to take 400 mg ( 2 tablets) Amiodarone  when she returns home , and 400 mg tonight at bedtime. This after Patient husband stated she had  5 tablets left - of Amiodarone .  RN  answered patient's question she and  husband voiced understanding  Changes (medications, testing, etc.) : EKG  Length of Visit: 45 minutes  EKG Interpretation Date/Time:  Monday November 06 2023 14:22:47 EST Ventricular Rate:  122 PR Interval:    QRS Duration:  74 QT Interval:  300 QTC Calculation: 427 R Axis:   65  Text Interpretation: Atrial fibrillation with rapid ventricular response Low voltage QRS Cannot rule out Anterior infarct (cited on or before 08-Jan-2020) When compared with ECG of 03-Nov-2023 16:50, No significant change was found Confirmed by Clay Alm ([id number]) on 11/06/2023 3:53:51 PM   Medications Adjustments/Labs and Tests Ordered: Orders Placed This Encounter  Procedures   EKG 12-Lead   EKG 12-Lead   Meds ordered this encounter  Medications   amiodarone  (PACERONE ) 200 MG tablet    Sig: Take 2 tablets now and 2 tablets at bedtime  one time dosing    Signed, Gladis Reena GAILS, RN  11/06/2023 3:28 PM   EKG read and reviewed.  Remains in A-fib RVR.  Would like to have her cardioverted.  States she does have amiodarone , she will take 400 mg twice daily today in preparation for upcoming cardioversion tomorrow 11/07/2023.  Signed,  Alm Clay, MD  11/06/2023 3:53 PM

## 2023-11-07 ENCOUNTER — Ambulatory Visit (HOSPITAL_COMMUNITY)
Admission: RE | Admit: 2023-11-07 | Discharge: 2023-11-07 | Disposition: A | Discharge status: Home / Self Care | Source: Home / Self Care | Attending: Internal Medicine | Admitting: Internal Medicine

## 2023-11-07 ENCOUNTER — Other Ambulatory Visit: Payer: Self-pay

## 2023-11-07 ENCOUNTER — Encounter (HOSPITAL_COMMUNITY)
Admission: RE | Disposition: A | Discharge status: Home / Self Care | Payer: Self-pay | Source: Home / Self Care | Attending: Internal Medicine

## 2023-11-07 ENCOUNTER — Ambulatory Visit (HOSPITAL_COMMUNITY): Admitting: Anesthesiology

## 2023-11-07 DIAGNOSIS — I4891 Unspecified atrial fibrillation: Secondary | ICD-10-CM

## 2023-11-07 LAB — BASIC METABOLIC PANEL WITH GFR
BUN/Creatinine Ratio: 15 (ref 12–28)
BUN: 18 mg/dL (ref 8–27)
CO2: 22 mmol/L (ref 20–29)
Calcium: 9.5 mg/dL (ref 8.7–10.3)
Chloride: 105 mmol/L (ref 96–106)
Creatinine, Ser: 1.24 mg/dL — ABNORMAL HIGH (ref 0.57–1.00)
Glucose: 107 mg/dL — ABNORMAL HIGH (ref 70–99)
Potassium: 4.4 mmol/L (ref 3.5–5.2)
Sodium: 141 mmol/L (ref 134–144)
eGFR: 47 mL/min/1.73 — ABNORMAL LOW (ref >59)

## 2023-11-07 LAB — CBC
Hematocrit: 38.1 % (ref 34.0–46.6)
Hemoglobin: 12 g/dL (ref 11.1–15.9)
MCH: 28.6 pg (ref 26.6–33.0)
MCHC: 31.5 g/dL (ref 31.5–35.7)
MCV: 91 fL (ref 79–97)
Platelets: 290 x10E3/uL (ref 150–450)
RBC: 4.19 x10E6/uL (ref 3.77–5.28)
RDW: 16.2 % — AB (ref 11.7–15.4)
WBC: 6.8 x10E3/uL (ref 3.4–10.8)

## 2023-11-07 SURGERY — CARDIOVERSION (CATH LAB)
Anesthesia: Monitor Anesthesia Care

## 2023-11-07 MED ORDER — SODIUM CHLORIDE 0.9% FLUSH: 3.0000 mL | INTRAVENOUS | Status: DC | PRN

## 2023-11-07 MED ORDER — SODIUM CHLORIDE 0.9% FLUSH: 3.0000 mL | Freq: Two times a day (BID) | INTRAVENOUS | Status: DC

## 2023-11-07 MED ORDER — SODIUM CHLORIDE 0.9 % IV SOLN: 250.0000 mL | INTRAVENOUS | Status: DC | PRN

## 2023-11-07 MED ORDER — AMIODARONE HCL 400 MG PO TABS: ORAL_TABLET | ORAL | 0 refills | Status: DC

## 2023-11-07 NOTE — Plan of Care (Addendum)
 Patient presented for a cardioversion today for atrial fibrillation. During the consenting process, the patient admitted to skipping at least one dose of Xarelto  in the past 3 weeks but is not sure when. I offered a TEE/CV which could be done today but the patient declined.   Unfortunately, the patient will need to be rescheduled and prefers to be rescheduled for 21 days from now to ensure compliance.   Discussed with Dr. Anner. Will start the patient on amiodarone  400 mg twice daily for one week then 400 mg daily.   GLENWOOD Emeline Calender, DO Cardiology

## 2023-11-07 NOTE — Interval H&P Note (Signed)
 History and Physical Interval Note:  11/07/2023 7:32 AM  Dominique Barber  has presented today for surgery, with the diagnosis of AFIB.  The various methods of treatment have been discussed with the patient and family. After consideration of risks, benefits and other options for treatment, the patient has consented to  Procedure(s): CARDIOVERSION (N/A) as a surgical intervention.  The patient's history has been reviewed, patient examined, no change in status, stable for surgery.  I have reviewed the patient's chart and labs.  Questions were answered to the patient's satisfaction.     [name]

## 2023-11-07 NOTE — Anesthesia Preprocedure Evaluation (Addendum)
 Anesthesia Evaluation  Patient identified by MRN, date of birth, ID band Patient awake    Reviewed: Allergy & Precautions, NPO status   History of Anesthesia Complications (+) PONV and history of anesthetic complications  Airway Mallampati: III  TM Distance: >3 FB Neck ROM: Full    Dental  (+) Edentulous Upper Only has 6 teeth on the bottom, none are loose.:   Pulmonary shortness of breath, COPD, neg recent URI, former smoker   Pulmonary exam normal breath sounds clear to auscultation       Cardiovascular hypertension, Pt. on home beta blockers (-) angina + CAD, + Past MI, + Cardiac Stents (most recent 2022) and + CABG (1998)  + dysrhythmias (proplylactic amiodarone  for surgery) Atrial Fibrillation  Rhythm:Regular Rate:Normal  HLD  TTE 03/31/2023: IMPRESSIONS    1. Left ventricular ejection fraction, by estimation, is 55 to 60%. The  left ventricle has normal function. The left ventricle has no regional  wall motion abnormalities. Left ventricular diastolic parameters were  normal. Elevated left atrial pressure.  The average left ventricular global longitudinal strain is -17.6 %. The  global longitudinal strain is abnormal.   2. Right ventricular systolic function is normal. The right ventricular  size is normal. Tricuspid regurgitation signal is inadequate for assessing  PA pressure.   3. The mitral valve is normal in structure. Trivial mitral valve  regurgitation. No evidence of mitral stenosis.   4. The aortic valve is tricuspid. Aortic valve regurgitation is not  visualized. No aortic stenosis is present.   5. The inferior vena cava is normal in size with greater than 50%  respiratory variability, suggesting right atrial pressure of 3 mmHg.   LHC 01/08/2020: SUMMARY  Severe single-vessel native disease with 100% occluded LAD after small sternal perforator/prior to D1.  100% occluded SVG-D1  CULPRIT: Tandem 75 and  85% irregular lesions in the prox-midSVG-LAD  Successful PTCA-DES PCI of SVG-LAD covering both lesions using a resolute Onyx DES 3.5 mm x 38 mm postdilated to 3.6 mm.    (Unable to use distal protection-unable to cross with device) -> lesions reduced to 0% with brisk TIMI-3 flow post PCI and extensive collateral flow noted.  Widely patent dominant native LCx with high OM/Ramus as well as nondominant RCA.  Also widely patent LAD beyond the anastomosis (wraparound LAD)  Mildly reduced EF of 40 to 45% with anterior hypokinesis.  Moderately elevated PA with significant systemic hypertension-likely related to full bladder.     Neuro/Psych  Headaches, neg Seizures    GI/Hepatic Neg liver ROS, hiatal hernia,GERD  ,,Colon cancer, IBS   Endo/Other  neg diabetesHypothyroidism  Multinodular goiter  Renal/GU negative Renal ROS     Musculoskeletal   Abdominal   Peds  Hematology  (+) Blood dyscrasia, anemia Lab Results      Component                Value               Date                      WBC                      8.7                 05/16/2023                HGB  10.6 (L)            05/16/2023                HCT                      34.9 (L)            05/16/2023                MCV                      88.8                05/16/2023                PLT                      345                 05/16/2023              Anesthesia Other Findings Last Xarelto : ~1 month ago  Reproductive/Obstetrics                              Anesthesia Physical Anesthesia Plan  ASA: 3  Anesthesia Plan: General   Post-op Pain Management:    Induction: Intravenous  PONV Risk Score and Plan: 4 or greater and Treatment may vary due to age or medical condition and Propofol  infusion  Airway Management Planned: Simple Face Mask  Additional Equipment:   Intra-op Plan:   Post-operative Plan:   Informed Consent: I have reviewed the patients History  and Physical, chart, labs and discussed the procedure including the risks, benefits and alternatives for the proposed anesthesia with the patient or authorized representative who has indicated his/her understanding and acceptance.     Dental advisory given  Plan Discussed with: CRNA and Anesthesiologist  Anesthesia Plan Comments: (See PAT note 05/16/2023  Risks of general anesthesia discussed including, but not limited to, sore throat, hoarse voice, chipped/damaged teeth, injury to vocal cords, nausea and vomiting, allergic reactions, lung infection, heart attack, stroke, and death. All questions answered. )        Anesthesia Quick Evaluation

## 2023-11-08 ENCOUNTER — Telehealth: Payer: Self-pay | Admitting: *Deleted

## 2023-11-08 NOTE — Telephone Encounter (Signed)
 Called patient . Spoke to patient and Husband   Informed both of the new date and time for Cardioversion.   RN  also informed patient do not miss any dose of her Xarelto  before this procedure.  She verbalized understanding.   Date 12/05/23 at 9 am - arrive time is 8 am Written instruction sent to patient and husband via mychart.   They both verbalized they use my chart.  Aware will call ,once RN knows if afib clinic needs to be reschedule from current date 11/23/23

## 2023-11-08 NOTE — Telephone Encounter (Signed)
 Scheduling  cardioversion   It was schedule for 12/05/23 at 9 am. Patient is to be there at 8 am.  [id number]

## 2023-11-08 NOTE — Telephone Encounter (Signed)
 RN discussed with Dr Anner 11/07/23 . Per Dr Anner it is okay do cardioversion 11/25/23 or 12 /2/25 or anytime the first week of Dec 2025  Called  patient. Informed patient was calling to reschedule  Cardioversion   Options - the week of Thanksgiving  or following week.  Patient preferred the first week of Dec 2025.

## 2023-11-10 NOTE — Telephone Encounter (Signed)
 Pt spouse called requesting to speak with nurse please advise

## 2023-11-10 NOTE — Telephone Encounter (Signed)
 It is currently scheduled for December 5 (which is 4 weeks from now..  She should still be fine and not have to reschedule.  Otherwise it would have to be the week of the eighth.  We would need to reschedule based on this timing.  Alm Clay, MD

## 2023-11-10 NOTE — Telephone Encounter (Signed)
 Probably digested if it was 30 min after taking.   I would say that she OK  [facility]

## 2023-11-10 NOTE — Telephone Encounter (Signed)
 I think she is just fearful of doing the cardioversion.  If she wants to be schedule, then I guess we reschedule.  The waiting is defeating the purpose.  Hopefully amiodarone  will work  She has been quite anxious about the cardioversion from the beginning.  I think that may be part of the issue.  Unfortunately, TEE cardioversion is not a very viable option with her given her GI issues.  We need 4 weeks of uninterrupted Xarelto .  Alm Clay, MD

## 2023-11-10 NOTE — Telephone Encounter (Signed)
 Spoke to patient's husband Cardioversion is scheduled 12/2 not 12/5 He wanted to ask Dr.Harding to make sure.

## 2023-11-10 NOTE — Telephone Encounter (Signed)
 Spoke with pt who states she and an episode of GERD last night about 30 minutes after taking Xarelto  in which she vomited.  Pt states she is scheduled for DCCV on 12/05/2023 and is unsure if it should be rescheduled due to the possibility of Xarelto  actually being vomited up. Pt advised will forward to Dr Anner and his nurse for further advisement.  Pt verbalizes understanding and agrees with current plan.

## 2023-11-10 NOTE — Telephone Encounter (Signed)
 Spoke to patient she stated she is concerned she vomited Xarelto  5 mins after taking.She would feel better if cardioversion is rescheduled.

## 2023-11-11 ENCOUNTER — Ambulatory Visit: Payer: Self-pay | Admitting: Cardiovascular Disease

## 2023-11-11 NOTE — Telephone Encounter (Signed)
 I will communicate with the performing physician on December 2.  I will let her know of the issue and confirm whether or not she would be okay.  We will let her know once I get a hold of Dr. Lonni.  Vomiting 30 minutes after a pill probably would not cause a problem but I will make sure.  Alm Clay, MD ' [place]: This is a very difficult situation we are trying to get this patient who is extremely nervous cardioverted.  She never mentioned having missed a dose of Xarelto  prior to her first scheduled attempt but when she showed up she mentioned that she missed a dose we therefore postpone for yet again another 4 weeks on 2 December when you are scheduled to be the cardioversion doc that day she is concerned because on Friday 11/7 she had an episode of extreme GERD and vomiting about 30 minutes after taking her Xarelto .  She did not see a pill in the vomit.    Should be still be okay with cardioversion on the second order we need to move it back to the fifth?  I just want this patient cardioverted and she is extremely anxious about it.  I do not think she is going to well if she stays in A-fib in the 100-120s for a long time.  DH

## 2023-11-13 NOTE — Telephone Encounter (Signed)
 Dominique Barber we can communicate with Layliana but if she wants to push it up to the December 5 were okay but I think we should are fine as long as she does not miss any more doses to just proceed with the December 2.

## 2023-11-20 NOTE — Telephone Encounter (Signed)
 Spoke to husband. He is aware patient is still ago for Dec 2 . He states she has not missed any doses Eliquis.  He is also aware Dr Anner would like for Dominique Barber to keep appointment with Afib clinic

## 2023-11-23 ENCOUNTER — Ambulatory Visit (HOSPITAL_COMMUNITY)
Admission: RE | Admit: 2023-11-23 | Discharge: 2023-11-23 | Disposition: A | Discharge status: Home / Self Care | Source: Ambulatory Visit | Attending: Physician Assistant | Admitting: Physician Assistant

## 2023-11-23 VITALS — BP 120/80 | HR 101 | Ht 62.0 in | Wt 164.2 lb

## 2023-11-23 DIAGNOSIS — I4891 Unspecified atrial fibrillation: Secondary | ICD-10-CM | POA: Diagnosis not present

## 2023-11-23 DIAGNOSIS — I4819 Other persistent atrial fibrillation: Secondary | ICD-10-CM | POA: Insufficient documentation

## 2023-11-23 DIAGNOSIS — D6869 Other thrombophilia: Secondary | ICD-10-CM | POA: Diagnosis not present

## 2023-11-23 DIAGNOSIS — Z79899 Other long term (current) drug therapy: Secondary | ICD-10-CM | POA: Insufficient documentation

## 2023-11-23 DIAGNOSIS — Z5181 Encounter for therapeutic drug level monitoring: Secondary | ICD-10-CM | POA: Diagnosis not present

## 2023-11-23 NOTE — Progress Notes (Signed)
 Primary Care Physician: Tanda Bleacher, MD Primary Cardiologist: Alm Clay, MD Electrophysiologist: None  Referring Physician: Dr Clay [name] is a 68 y.o. female with a history of CAD s/p CABG, DM, HTN, HLD, COPD, atrial fibrillation who presents for follow up in the [facility]. Patient presented to [facility] on 05/25/2023 for newly discovered cecal adenocarcinoma.  She underwent laparoscopic right hemicolectomy by Dr. Teresa on 05/25/2023.  Hospitalist has been consulted for postop management.  She was found in A-fib RVR.  Cardiology was consulted for further evaluation. She was discharged in rate controlled afib. She was seen by Dr Clay on 11/03/23 and found to be in afib with elevated rates. Started on amiodarone  11/06/23. She was set up for DCCV on 11/07/23 but she missed a dose of Xarelto  and the procedure was rescheduled for 12/05/23. Patient is on Xarelto  for stroke prevention.    Patient presents today for follow up for atrial fibrillation. She remains in afib today with symptoms of SOB and fatigue. No bleeding issues on anticoagulation.   Today, she denies symptoms of palpitations, chest pain, orthopnea, PND, lower extremity edema, dizziness, presyncope, syncope, snoring, daytime somnolence, bleeding, or neurologic sequela. The patient is tolerating medications without difficulties and is otherwise without complaint today.    Atrial Fibrillation Risk Factors:  she does not have symptoms or diagnosis of sleep apnea. she does not have a history of rheumatic fever.   Atrial Fibrillation Management history:  Previous antiarrhythmic drugs: amiodarone   Previous cardioversions: none Previous ablations: none Anticoagulation history: Xarelto   ROS- All systems are reviewed and negative except as per the HPI above.  Past Medical History:  Diagnosis Date   ACS (acute coronary syndrome) (HCC) 01/06/2020   With progressive angina  while troponin elevation--borderline-NON-STEMI   ANTERIOR STEMI of LAD (x2). 01/19/1996   Complicated by VF arrest, and anoxic brain injury with status epilepticus; POBA of LAD --> 6 months later, after 2 vessel CABG she had postop complication with occluded LIMA/second anterior STEMI   CAD in native artery & Grafts 01/1996   a) 1/'98: Ant STEMI => LAD POBA; b) 2/'98: 95% prox LAD @ POBA site --> BMS PCI (3.0 mm x 25 mm); c) 7/98 (5 months later) => BMS ISR of LAD--> CABGx2 (LIMA-LAD, SVG-D1 -> Emergent redo w/ SVG-LAD b/c acute LIMA occlusion); d) 01/2020: ACS -> DES PCI of SVG-LAD (75%&85%), TO of SVG-D1.   COPD (chronic obstructive pulmonary disease) (HCC)    Dyspnea    GERD (gastroesophageal reflux disease)    Headache    Hiatal hernia    Hyperlipidemia    Hypertension    Hypothyroidism    On Synthroid    Iron deficiency anemia    Irritable bowel syndrome    Followed by Dr. Renaye Sous.   Ischemic cardiomyopathy 01/03/2020   ECHO (ACS): EF 45-50% (by Cath EF 40-45%) - Apical Inferior/Apical Anteroseptal & Apex akinetic, GR II DD.    PAF (paroxysmal atrial fibrillation) (HCC) 05/03/2014   New onset - originally with RVR   Pneumonia    x 2   PONV (postoperative nausea and vomiting)    very gag reflex   S/P CABG x 2; with EMERGENT Redo x 1. 07/1996   Initially LIMA-LAD, SVG-D1 --> immediate LIMA failure post CABG with anterior MI --> urgent redo SVG-LAD beyond D2.; Widely patent grafts as of 2005 with left dominant system. Graft to the diagonal branch has a very small target vessel but the  vein graft to LAD retrograde fills a large bifurcating D2 and antegrade fills a large D3.   Tobacco abuse     Current Outpatient Medications  Medication Sig Dispense Refill   acetaminophen  (TYLENOL ) 500 MG tablet Take 500 mg by mouth every 6 (six) hours as needed for mild pain. (Patient taking differently: Take 500 mg by mouth as needed for mild pain (pain score 1-3).)     albuterol  (VENTOLIN  HFA)  108 (90 Base) MCG/ACT inhaler Inhale 1-2 puffs into the lungs every 6 (six) hours as needed for wheezing or shortness of breath. 1 each 2   Alpha-D-Galactosidase (BEANO PO) Take 2 capsules by mouth See admin instructions. Take 2 capsules after lunch and dinner     amiodarone  (PACERONE ) 400 MG tablet Take 1 tablet (400 mg total) by mouth 2 (two) times daily for 7 days, THEN 1 tablet (400 mg total) daily. 44 tablet 0   Calcium  Carb-Cholecalciferol  (CALCIUM +D3 PO) Take 1 tablet by mouth at bedtime.     cetirizine  (ZYRTEC ) 10 MG tablet TAKE 1 TABLET DAILY 90 tablet 4   Evolocumab  (REPATHA  SURECLICK) 140 MG/ML SOAJ Inject 140 mg into the skin every 14 (fourteen) days. 6 mL 3   levothyroxine  (SYNTHROID ) 50 MCG tablet TAKE ONE AND ONE-HALF TABLETS DAILY BEFORE BREAKFAST 135 tablet 3   loperamide  (IMODIUM ) 2 MG capsule Take 1 capsule (2 mg total) by mouth daily. (Patient taking differently: Take 2 mg by mouth as needed.) 30 capsule 0   methocarbamol  (ROBAXIN ) 500 MG tablet Take 1 tablet (500 mg total) by mouth every 8 (eight) hours as needed for muscle spasms. (Patient taking differently: Take 500 mg by mouth as needed for muscle spasms.) 10 tablet 0   metoprolol  succinate (TOPROL -XL) 25 MG 24 hr tablet Take 1 tablet (25 mg total) by mouth in the morning and at bedtime. 180 tablet 3   nitroGLYCERIN  (NITROSTAT ) 0.4 MG SL tablet Place 1 tablet (0.4 mg total) under the tongue every 5 (five) minutes x 3 doses as needed for chest pain. 25 tablet 3   Peppermint Oil (IBGARD PO) Take 1-2 tablets by mouth See admin instructions. Take 2 after lunch and 1 tablet after supper, may take a 1 tablet dose as needed for IBS symptoms     polyethylene glycol powder (GLYCOLAX /MIRALAX ) 17 GM/SCOOP powder Take 12.75 g by mouth in the morning.     potassium chloride  SA (KLOR-CON  M) 20 MEQ tablet Take 20 mEq by mouth daily.     Probiotic Product (ALIGN) 4 MG CAPS Take 4 mg by mouth in the morning.     rivaroxaban  (XARELTO ) 20 MG  TABS tablet Take 1 tablet (20 mg total) by mouth daily with supper. 90 tablet 3   Simethicone  125 MG TABS Take 125-250 mg by mouth daily as needed (after meals).     No current facility-administered medications for this encounter.    Physical Exam: BP 120/80   Pulse (!) 101   Ht 5' 2 (1.575 m)   Wt 74.5 kg   BMI 30.03 kg/m   GEN: Well nourished, well developed in no acute distress CARDIAC: Irregularly irregular rate and rhythm, no murmurs, rubs, gallops RESPIRATORY:  Clear to auscultation without rales, wheezing or rhonchi  ABDOMEN: Soft, non-tender, non-distended EXTREMITIES:  No edema; No deformity   Wt Readings from Last 3 Encounters:  11/23/23 74.5 kg  11/07/23 73.5 kg  11/06/23 74.5 kg     EKG Interpretation Date/Time:  Thursday November 23 2023 15:24:06 EST Ventricular  Rate:  101 PR Interval:    QRS Duration:  80 QT Interval:  352 QTC Calculation: 456 R Axis:   93  Text Interpretation: Atrial fibrillation with rapid ventricular response Rightward axis Anterior infarct (cited on or before 08-Jan-2020) Abnormal ECG When compared with ECG of 06-Nov-2023 14:22, No significant change was found Confirmed by Vibhav Waddill ([id number]) on 11/23/2023 4:11:09 PM   Echo 03/31/23 demonstrated   1. Left ventricular ejection fraction, by estimation, is 55 to 60%. The  left ventricle has normal function. The left ventricle has no regional  wall motion abnormalities. Left ventricular diastolic parameters were  normal. Elevated left atrial pressure. The average left ventricular global longitudinal strain is -17.6 %. The global longitudinal strain is abnormal.   2. Right ventricular systolic function is normal. The right ventricular  size is normal. Tricuspid regurgitation signal is inadequate for assessing  PA pressure.   3. The mitral valve is normal in structure. Trivial mitral valve  regurgitation. No evidence of mitral stenosis.   4. The aortic valve is tricuspid. Aortic valve  regurgitation is not  visualized. No aortic stenosis is present.   5. The inferior vena cava is normal in size with greater than 50%  respiratory variability, suggesting right atrial pressure of 3 mmHg.    CHA2DS2-VASc Score = 4  The patient's score is based upon: CHF History: 0 HTN History: 1 Diabetes History: 0 Stroke History: 0 Vascular Disease History: 1 Age Score: 1 Gender Score: 1       ASSESSMENT AND PLAN: Persistent Atrial Fibrillation (ICD10:  I48.19) The patient's CHA2DS2-VASc score is 4, indicating a 4.8% annual risk of stroke.   Patient remains in afib today. Scheduled for DCCV on 12/05/23 Continue amiodarone  400 mg daily for now. Decrease to 200 mg once daily post DCCV.  Continue Toprol  25 mg BID Continue Xarelto  20 mg daily Patient has a visit scheduled with Dr Nancey to discuss afib ablation.   Secondary Hypercoagulable State (ICD10:  D68.69) The patient is at significant risk for stroke/thromboembolism based upon her CHA2DS2-VASc Score of 4.  Continue Rivaroxaban  (Xarelto ). No recent bleeding issues.  High Risk Medication Monitoring (ICD 10: J342684) Patient requires ongoing monitoring for anti-arrhythmic medication which has the potential to cause life threatening arrhythmias. Intervals on ECG acceptable for amiodarone  monitoring.   CAD S/p CABG No anginal symptoms Followed by Dr Anner  HTN Stable on current regimen   Follow up with Dr Nancey as scheduled.    Informed Consent   Shared Decision Making/Informed Consent The risks (stroke, cardiac arrhythmias rarely resulting in the need for a temporary or permanent pacemaker, skin irritation or burns and complications associated with conscious sedation including aspiration, arrhythmia, respiratory failure and death), benefits (restoration of normal sinus rhythm) and alternatives of a direct current cardioversion were explained in detail to Dominique Barber and she agrees to proceed.       [facility] [address] [place], [place] [address] [phone]

## 2023-11-23 NOTE — H&P (View-Only) (Signed)
 Primary Care Physician: Tanda Bleacher, MD Primary Cardiologist: Alm Clay, MD Electrophysiologist: None  Referring Physician: Dr Clay Dominique Barber is a 68 y.o. female with a history of CAD s/p CABG, DM, HTN, HLD, COPD, atrial fibrillation who presents for follow up in the [facility]. Patient presented to [facility] on 05/25/2023 for newly discovered cecal adenocarcinoma.  She underwent laparoscopic right hemicolectomy by Dr. Teresa on 05/25/2023.  Hospitalist has been consulted for postop management.  She was found in A-fib RVR.  Cardiology was consulted for further evaluation. She was discharged in rate controlled afib. She was seen by Dr Clay on 11/03/23 and found to be in afib with elevated rates. Started on amiodarone  11/06/23. She was set up for DCCV on 11/07/23 but she missed a dose of Xarelto  and the procedure was rescheduled for 12/05/23. Patient is on Xarelto  for stroke prevention.    Patient presents today for follow up for atrial fibrillation. She remains in afib today with symptoms of SOB and fatigue. No bleeding issues on anticoagulation.   Today, she denies symptoms of palpitations, chest pain, orthopnea, PND, lower extremity edema, dizziness, presyncope, syncope, snoring, daytime somnolence, bleeding, or neurologic sequela. The patient is tolerating medications without difficulties and is otherwise without complaint today.    Atrial Fibrillation Risk Factors:  she does not have symptoms or diagnosis of sleep apnea. she does not have a history of rheumatic fever.   Atrial Fibrillation Management history:  Previous antiarrhythmic drugs: amiodarone   Previous cardioversions: none Previous ablations: none Anticoagulation history: Xarelto   ROS- All systems are reviewed and negative except as per the HPI above.  Past Medical History:  Diagnosis Date   ACS (acute coronary syndrome) (HCC) 01/06/2020   With progressive angina  while troponin elevation--borderline-NON-STEMI   ANTERIOR STEMI of LAD (x2). 01/19/1996   Complicated by VF arrest, and anoxic brain injury with status epilepticus; POBA of LAD --> 6 months later, after 2 vessel CABG she had postop complication with occluded LIMA/second anterior STEMI   CAD in native artery & Grafts 01/1996   a) 1/'98: Ant STEMI => LAD POBA; b) 2/'98: 95% prox LAD @ POBA site --> BMS PCI (3.0 mm x 25 mm); c) 7/98 (5 months later) => BMS ISR of LAD--> CABGx2 (LIMA-LAD, SVG-D1 -> Emergent redo w/ SVG-LAD b/c acute LIMA occlusion); d) 01/2020: ACS -> DES PCI of SVG-LAD (75%&85%), TO of SVG-D1.   COPD (chronic obstructive pulmonary disease) (HCC)    Dyspnea    GERD (gastroesophageal reflux disease)    Headache    Hiatal hernia    Hyperlipidemia    Hypertension    Hypothyroidism    On Synthroid    Iron deficiency anemia    Irritable bowel syndrome    Followed by Dr. Renaye Sous.   Ischemic cardiomyopathy 01/03/2020   ECHO (ACS): EF 45-50% (by Cath EF 40-45%) - Apical Inferior/Apical Anteroseptal & Apex akinetic, GR II DD.    PAF (paroxysmal atrial fibrillation) (HCC) 05/03/2014   New onset - originally with RVR   Pneumonia    x 2   PONV (postoperative nausea and vomiting)    very gag reflex   S/P CABG x 2; with EMERGENT Redo x 1. 07/1996   Initially LIMA-LAD, SVG-D1 --> immediate LIMA failure post CABG with anterior MI --> urgent redo SVG-LAD beyond D2.; Widely patent grafts as of 2005 with left dominant system. Graft to the diagonal branch has a very small target vessel but the  vein graft to LAD retrograde fills a large bifurcating D2 and antegrade fills a large D3.   Tobacco abuse     Current Outpatient Medications  Medication Sig Dispense Refill   acetaminophen  (TYLENOL ) 500 MG tablet Take 500 mg by mouth every 6 (six) hours as needed for mild pain. (Patient taking differently: Take 500 mg by mouth as needed for mild pain (pain score 1-3).)     albuterol  (VENTOLIN  HFA)  108 (90 Base) MCG/ACT inhaler Inhale 1-2 puffs into the lungs every 6 (six) hours as needed for wheezing or shortness of breath. 1 each 2   Alpha-D-Galactosidase (BEANO PO) Take 2 capsules by mouth See admin instructions. Take 2 capsules after lunch and dinner     amiodarone  (PACERONE ) 400 MG tablet Take 1 tablet (400 mg total) by mouth 2 (two) times daily for 7 days, THEN 1 tablet (400 mg total) daily. 44 tablet 0   Calcium  Carb-Cholecalciferol  (CALCIUM +D3 PO) Take 1 tablet by mouth at bedtime.     cetirizine  (ZYRTEC ) 10 MG tablet TAKE 1 TABLET DAILY 90 tablet 4   Evolocumab  (REPATHA  SURECLICK) 140 MG/ML SOAJ Inject 140 mg into the skin every 14 (fourteen) days. 6 mL 3   levothyroxine  (SYNTHROID ) 50 MCG tablet TAKE ONE AND ONE-HALF TABLETS DAILY BEFORE BREAKFAST 135 tablet 3   loperamide  (IMODIUM ) 2 MG capsule Take 1 capsule (2 mg total) by mouth daily. (Patient taking differently: Take 2 mg by mouth as needed.) 30 capsule 0   methocarbamol  (ROBAXIN ) 500 MG tablet Take 1 tablet (500 mg total) by mouth every 8 (eight) hours as needed for muscle spasms. (Patient taking differently: Take 500 mg by mouth as needed for muscle spasms.) 10 tablet 0   metoprolol  succinate (TOPROL -XL) 25 MG 24 hr tablet Take 1 tablet (25 mg total) by mouth in the morning and at bedtime. 180 tablet 3   nitroGLYCERIN  (NITROSTAT ) 0.4 MG SL tablet Place 1 tablet (0.4 mg total) under the tongue every 5 (five) minutes x 3 doses as needed for chest pain. 25 tablet 3   Peppermint Oil (IBGARD PO) Take 1-2 tablets by mouth See admin instructions. Take 2 after lunch and 1 tablet after supper, may take a 1 tablet dose as needed for IBS symptoms     polyethylene glycol powder (GLYCOLAX /MIRALAX ) 17 GM/SCOOP powder Take 12.75 g by mouth in the morning.     potassium chloride  SA (KLOR-CON  M) 20 MEQ tablet Take 20 mEq by mouth daily.     Probiotic Product (ALIGN) 4 MG CAPS Take 4 mg by mouth in the morning.     rivaroxaban  (XARELTO ) 20 MG  TABS tablet Take 1 tablet (20 mg total) by mouth daily with supper. 90 tablet 3   Simethicone  125 MG TABS Take 125-250 mg by mouth daily as needed (after meals).     No current facility-administered medications for this encounter.    Physical Exam: BP 120/80   Pulse (!) 101   Ht 5' 2 (1.575 m)   Wt 74.5 kg   BMI 30.03 kg/m   GEN: Well nourished, well developed in no acute distress CARDIAC: Irregularly irregular rate and rhythm, no murmurs, rubs, gallops RESPIRATORY:  Clear to auscultation without rales, wheezing or rhonchi  ABDOMEN: Soft, non-tender, non-distended EXTREMITIES:  No edema; No deformity   Wt Readings from Last 3 Encounters:  11/23/23 74.5 kg  11/07/23 73.5 kg  11/06/23 74.5 kg     EKG Interpretation Date/Time:  Thursday November 23 2023 15:24:06 EST Ventricular  Rate:  101 PR Interval:    QRS Duration:  80 QT Interval:  352 QTC Calculation: 456 R Axis:   93  Text Interpretation: Atrial fibrillation with rapid ventricular response Rightward axis Anterior infarct (cited on or before 08-Jan-2020) Abnormal ECG When compared with ECG of 06-Nov-2023 14:22, No significant change was found Confirmed by Vibhav Waddill ([id number]) on 11/23/2023 4:11:09 PM   Echo 03/31/23 demonstrated   1. Left ventricular ejection fraction, by estimation, is 55 to 60%. The  left ventricle has normal function. The left ventricle has no regional  wall motion abnormalities. Left ventricular diastolic parameters were  normal. Elevated left atrial pressure. The average left ventricular global longitudinal strain is -17.6 %. The global longitudinal strain is abnormal.   2. Right ventricular systolic function is normal. The right ventricular  size is normal. Tricuspid regurgitation signal is inadequate for assessing  PA pressure.   3. The mitral valve is normal in structure. Trivial mitral valve  regurgitation. No evidence of mitral stenosis.   4. The aortic valve is tricuspid. Aortic valve  regurgitation is not  visualized. No aortic stenosis is present.   5. The inferior vena cava is normal in size with greater than 50%  respiratory variability, suggesting right atrial pressure of 3 mmHg.    CHA2DS2-VASc Score = 4  The patient's score is based upon: CHF History: 0 HTN History: 1 Diabetes History: 0 Stroke History: 0 Vascular Disease History: 1 Age Score: 1 Gender Score: 1       ASSESSMENT AND PLAN: Persistent Atrial Fibrillation (ICD10:  I48.19) The patient's CHA2DS2-VASc score is 4, indicating a 4.8% annual risk of stroke.   Patient remains in afib today. Scheduled for DCCV on 12/05/23 Continue amiodarone  400 mg daily for now. Decrease to 200 mg once daily post DCCV.  Continue Toprol  25 mg BID Continue Xarelto  20 mg daily Patient has a visit scheduled with Dr Nancey to discuss afib ablation.   Secondary Hypercoagulable State (ICD10:  D68.69) The patient is at significant risk for stroke/thromboembolism based upon her CHA2DS2-VASc Score of 4.  Continue Rivaroxaban  (Xarelto ). No recent bleeding issues.  High Risk Medication Monitoring (ICD 10: J342684) Patient requires ongoing monitoring for anti-arrhythmic medication which has the potential to cause life threatening arrhythmias. Intervals on ECG acceptable for amiodarone  monitoring.   CAD S/p CABG No anginal symptoms Followed by Dr Anner  HTN Stable on current regimen   Follow up with Dr Nancey as scheduled.    Informed Consent   Shared Decision Making/Informed Consent The risks (stroke, cardiac arrhythmias rarely resulting in the need for a temporary or permanent pacemaker, skin irritation or burns and complications associated with conscious sedation including aspiration, arrhythmia, respiratory failure and death), benefits (restoration of normal sinus rhythm) and alternatives of a direct current cardioversion were explained in detail to Ms. Mehra and she agrees to proceed.       [facility] [address] [place], [place] [address] [phone]

## 2023-11-23 NOTE — Patient Instructions (Addendum)
 Day of Cardioversion reduce to Amiodarone  to 200mg  once a day (1/2 of your 400mg  tablet)   Cardioversion scheduled for: Tuesday, December 2nd   - Arrive at the [facility] ([address]  and check in with ADMITTING at 8:00 AM   - Do not eat or drink anything after midnight the night prior to your procedure.   - Take all your morning medication (except diabetic medications) with a sip of water prior to arrival.  - Do NOT miss any doses of your blood thinner - if you should miss a dose or take a dose more than 4 hours late -- please notify our office immediately.  - You will not be able to drive home after your procedure. Please ensure you have a responsible adult to drive you home. You will need someone with you for 24 hours post procedure.     - Expect to be in the procedural area approximately 2 hours.   - If you feel as if you go back into normal rhythm prior to scheduled cardioversion, please notify our office immediately.   If your procedure is canceled in the cardioversion suite you will be charged a cancellation fee.

## 2023-12-04 NOTE — Progress Notes (Signed)
 Pt called for pre procedure instructions. Arrival time 0745 NPO after midnight explained Instructed to take am meds with sip of water and confirmed blood thinner consistency Instructed pt need for ride home tomorrow and have responsible adult with them for 24 hrs post procedure.

## 2023-12-05 ENCOUNTER — Other Ambulatory Visit: Payer: Self-pay

## 2023-12-05 ENCOUNTER — Encounter (HOSPITAL_COMMUNITY): Payer: Self-pay | Admitting: Registered Nurse

## 2023-12-05 ENCOUNTER — Ambulatory Visit (HOSPITAL_COMMUNITY)
Admission: RE | Admit: 2023-12-05 | Discharge: 2023-12-05 | Disposition: A | Attending: Cardiology | Admitting: Cardiology

## 2023-12-05 ENCOUNTER — Encounter (HOSPITAL_COMMUNITY): Payer: Self-pay | Admitting: Cardiology

## 2023-12-05 ENCOUNTER — Encounter (HOSPITAL_COMMUNITY)
Admission: RE | Disposition: A | Discharge status: Home / Self Care | Payer: Self-pay | Source: Home / Self Care | Attending: Cardiology

## 2023-12-05 DIAGNOSIS — Z539 Procedure and treatment not carried out, unspecified reason: Secondary | ICD-10-CM | POA: Diagnosis not present

## 2023-12-05 DIAGNOSIS — Z951 Presence of aortocoronary bypass graft: Secondary | ICD-10-CM | POA: Diagnosis not present

## 2023-12-05 DIAGNOSIS — I4819 Other persistent atrial fibrillation: Secondary | ICD-10-CM

## 2023-12-05 DIAGNOSIS — I10 Essential (primary) hypertension: Secondary | ICD-10-CM | POA: Diagnosis not present

## 2023-12-05 DIAGNOSIS — Z006 Encounter for examination for normal comparison and control in clinical research program: Secondary | ICD-10-CM

## 2023-12-05 DIAGNOSIS — I2510 Atherosclerotic heart disease of native coronary artery without angina pectoris: Secondary | ICD-10-CM | POA: Diagnosis not present

## 2023-12-05 DIAGNOSIS — Z79899 Other long term (current) drug therapy: Secondary | ICD-10-CM | POA: Diagnosis not present

## 2023-12-05 DIAGNOSIS — E119 Type 2 diabetes mellitus without complications: Secondary | ICD-10-CM | POA: Diagnosis not present

## 2023-12-05 DIAGNOSIS — J449 Chronic obstructive pulmonary disease, unspecified: Secondary | ICD-10-CM | POA: Diagnosis not present

## 2023-12-05 DIAGNOSIS — D6869 Other thrombophilia: Secondary | ICD-10-CM | POA: Diagnosis not present

## 2023-12-05 DIAGNOSIS — Z7901 Long term (current) use of anticoagulants: Secondary | ICD-10-CM | POA: Diagnosis not present

## 2023-12-05 DIAGNOSIS — E785 Hyperlipidemia, unspecified: Secondary | ICD-10-CM | POA: Diagnosis not present

## 2023-12-05 SURGERY — CARDIOVERSION (CATH LAB)
Anesthesia: General

## 2023-12-05 MED ORDER — SODIUM CHLORIDE 0.9 % IV SOLN: INTRAVENOUS | Status: DC

## 2023-12-05 NOTE — Interval H&P Note (Signed)
 History and Physical Interval Note:  12/05/2023 8:14 AM  Dominique Barber  has presented today for surgery, with the diagnosis of AFIB.  The various methods of treatment have been discussed with the patient and family. After consideration of risks, benefits and other options for treatment, the patient has consented to  Procedure(s): CARDIOVERSION (N/A) as a surgical intervention.  The patient's history has been reviewed, patient examined, no change in status, stable for surgery.  I have reviewed the patient's chart and labs.  Questions were answered to the patient's satisfaction.     Manish Ruggiero Lonni

## 2023-12-06 ENCOUNTER — Encounter: Payer: Self-pay | Admitting: Cardiology

## 2023-12-06 MED ORDER — AMIODARONE HCL 200 MG PO TABS: 200.0000 mg | ORAL_TABLET | Freq: Every day | ORAL | 3 refills | Status: AC

## 2023-12-08 NOTE — Research (Signed)
 [facility] Cardioversion Informed Consent   Subject Name: Dominique Barber  Subject met inclusion and exclusion criteria.  The informed consent form, study requirements and expectations were reviewed with the subject and questions and concerns were addressed prior to the signing of the consent form.  The subject verbalized understanding of the trial requirements.  The subject agreed to participate in the [facility] Cardioversion trial and signed the informed consent at 0750 on 02/Dec/2025.  The informed consent was obtained prior to performance of any protocol-specific procedures for the subject.  A copy of the signed informed consent was given to the subject and a copy was placed in the subject's medical record.   [name]   Pt signed ICF and began data collection. It was then determined that the pt was in NSR and case was canceled.

## 2023-12-25 ENCOUNTER — Ambulatory Visit: Attending: Cardiovascular Disease | Admitting: Cardiovascular Disease

## 2023-12-25 ENCOUNTER — Encounter: Payer: Self-pay | Admitting: Cardiovascular Disease

## 2023-12-25 VITALS — BP 120/74 | HR 63 | Ht 62.0 in | Wt 164.0 lb

## 2023-12-25 DIAGNOSIS — Z79899 Other long term (current) drug therapy: Secondary | ICD-10-CM | POA: Insufficient documentation

## 2023-12-25 DIAGNOSIS — Z5181 Encounter for therapeutic drug level monitoring: Secondary | ICD-10-CM | POA: Diagnosis not present

## 2023-12-25 DIAGNOSIS — I4819 Other persistent atrial fibrillation: Secondary | ICD-10-CM | POA: Diagnosis not present

## 2023-12-25 NOTE — Patient Instructions (Signed)
 Medication Instructions:  Your physician recommends that you continue on your current medications as directed. Please refer to the Current Medication list given to you today.  *If you need a refill on your cardiac medications before your next appointment, please call your pharmacy*   Follow-Up: At [facility], you and your health needs are our priority.  As part of our continuing mission to provide you with exceptional heart care, our providers are all part of one team.  This team includes your primary Cardiologist (physician) and Advanced Practice Providers or APPs (Physician Assistants and Nurse Practitioners) who all work together to provide you with the care you need, when you need it.  Your next appointment:   3 months  Provider:   You will follow up in the Atrial Fibrillation Clinic located at [facility]. Your provider will be: Clint R. Fenton, PA-C or Fairy Heinrich, PA-C

## 2023-12-25 NOTE — Progress Notes (Signed)
 " Electrophysiology Office Note:    Date:  12/25/2023   ID:  Nel, Stoneking 12-Jan-1955, MRN [id number]  PCP:  Tanda Bleacher, MD   [facility] Providers Cardiologist:  Alm Clay, MD Electrophysiologist:  Eulas FORBES Furbish, MD     Referring MD: Clay Alm ORN, MD   History of Present Illness:    Dominique Barber is a 68 y.o. female with a medical history significant for coronary artery disease status post CABG and PCI, diabetes, hypertension, hyperlipidemia, COPD, referred for atrial fibrillation management.       Discussed the use of AI scribe software for clinical note transcription with the patient, who gave verbal consent to proceed.  History of Present Illness She was admitted to [facility] in May 2025 for management of a cecal adenocarcinoma.  She underwent laparoscopic right hemicolectomy on May 22.  Her hospitalization was complicated by atrial fibrillation with RVR.  She has seen cardiology in follow-up and noted to be in atrial fibrillation in November 2025.  She underwent DC cardioversion on December 05, 2023.  She is maintained on amiodarone  200 mg daily.         Today, she reports that she is at baseline and has no acute complaints.  EKGs/Labs/Other Studies Reviewed Today:     Echocardiogram:  TTE March 31, 2023 Normal LV structure and function.  No significant valvular disease.  Atria are normal in size     Cardiac catherization  Coronary angiogram January 2022 100% occluded LAD  EKG:   EKG Interpretation Date/Time:  Monday December 25 2023 14:24:39 EST Ventricular Rate:  63 PR Interval:  154 QRS Duration:  74 QT Interval:  426 QTC Calculation: 435 R Axis:   92  Text Interpretation: Normal sinus rhythm Rightward axis Anterior infarct (cited on or before 08-Jan-2020) When compared with ECG of 05-Dec-2023 08:24, No significant change was found Confirmed by Furbish Eulas [phone]) on 12/25/2023 2:25:36 PM      Physical Exam:    VS:  BP 120/74 (BP Location: Left Arm, Patient Position: Sitting, Cuff Size: Large)   Pulse 63   Ht 5' 2 (1.575 m)   Wt 164 lb (74.4 kg)   SpO2 94%   BMI 30.00 kg/m     Wt Readings from Last 3 Encounters:  12/25/23 164 lb (74.4 kg)  11/23/23 164 lb 3.2 oz (74.5 kg)  11/07/23 162 lb (73.5 kg)     GEN: Well nourished, well developed in no acute distress CARDIAC: RRR, no murmurs, rubs, gallops RESPIRATORY:  Normal work of breathing MUSCULOSKELETAL: no edema    ASSESSMENT & PLAN:     Persistent atrial fibrillation Symptomatic with palpitations and fatigue We discussed the indication for rhythm control and options including medical therapy versus ablation. I recommended ablation as the therapy of choice.  I described the logistics and risks of the procedure including risk of stroke, heart attack, need for cardiac surgery or prolonged hospital stay and a low but nonzero risk of death. She does not want to schedule procedure at this time. She may be interested in pursuing ablation next year.  She will take some time to think about it. If she does not opt to undergo ablation, I would discontinue amiodarone  and switch her to Tikosyn.  Secondary hypercoagulable state CHA2DS2-VASc score is 4  Coronary artery disease Status post CABG She is free of angina Follows with Dr. Clay      Signed, Eulas FORBES Furbish, MD  12/25/2023 2:49 PM  [facility] "

## 2024-01-15 ENCOUNTER — Other Ambulatory Visit (HOSPITAL_COMMUNITY): Payer: Self-pay | Admitting: Surgery

## 2024-01-15 DIAGNOSIS — C180 Malignant neoplasm of cecum: Secondary | ICD-10-CM

## 2024-01-23 ENCOUNTER — Other Ambulatory Visit: Payer: Self-pay | Admitting: Family Medicine

## 2024-01-23 DIAGNOSIS — Z1231 Encounter for screening mammogram for malignant neoplasm of breast: Secondary | ICD-10-CM

## 2024-01-25 ENCOUNTER — Ambulatory Visit (HOSPITAL_COMMUNITY)
Admission: RE | Admit: 2024-01-25 | Discharge: 2024-01-25 | Disposition: A | Discharge status: Home / Self Care | Source: Ambulatory Visit | Attending: Surgery | Admitting: Surgery

## 2024-01-25 DIAGNOSIS — C180 Malignant neoplasm of cecum: Secondary | ICD-10-CM | POA: Insufficient documentation

## 2024-01-25 MED ORDER — IOHEXOL 300 MG/ML  SOLN
100.0000 mL | Freq: Once | INTRAMUSCULAR | Status: AC | PRN
  Administered 2024-01-25: 100 mL via INTRAVENOUS

## 2024-02-22 ENCOUNTER — Ambulatory Visit

## 2024-02-22 DIAGNOSIS — Z1231 Encounter for screening mammogram for malignant neoplasm of breast: Secondary | ICD-10-CM

## 2024-03-07 ENCOUNTER — Ambulatory Visit: Admitting: Obstetrics and Gynecology

## 2024-03-25 ENCOUNTER — Ambulatory Visit (HOSPITAL_COMMUNITY): Admitting: Physician Assistant

## 2024-08-29 ENCOUNTER — Ambulatory Visit

## 2024-09-11 ENCOUNTER — Other Ambulatory Visit

## 2024-09-16 ENCOUNTER — Ambulatory Visit: Admitting: Endocrinology
